# Patient Record
Sex: Male | Born: 1950 | Race: Black or African American | Hispanic: No | Marital: Married | Smoking: Former smoker
Health system: Southern US, Community
[De-identification: ages and names within clinical notes are randomized; demographics above are authoritative.]

## PROBLEM LIST (undated history)

## (undated) DIAGNOSIS — I10 Essential (primary) hypertension: Secondary | ICD-10-CM

## (undated) DIAGNOSIS — K257 Chronic gastric ulcer without hemorrhage or perforation: Secondary | ICD-10-CM

## (undated) DIAGNOSIS — K5792 Diverticulitis of intestine, part unspecified, without perforation or abscess without bleeding: Secondary | ICD-10-CM

## (undated) DIAGNOSIS — M549 Dorsalgia, unspecified: Secondary | ICD-10-CM

## (undated) DIAGNOSIS — Z5189 Encounter for other specified aftercare: Secondary | ICD-10-CM

## (undated) DIAGNOSIS — N200 Calculus of kidney: Secondary | ICD-10-CM

## (undated) HISTORY — DX: Chronic gastric ulcer without hemorrhage or perforation: K25.7

## (undated) HISTORY — DX: Dorsalgia, unspecified: M54.9

## (undated) HISTORY — DX: Calculus of kidney: N20.0

## (undated) HISTORY — DX: Encounter for other specified aftercare: Z51.89

## (undated) HISTORY — DX: Essential (primary) hypertension: I10

## (undated) HISTORY — DX: Diverticulitis of intestine, part unspecified, without perforation or abscess without bleeding: K57.92

---

## 2004-07-30 ENCOUNTER — Ambulatory Visit: Payer: Self-pay | Admitting: Anesthesiology

## 2004-10-12 ENCOUNTER — Ambulatory Visit: Payer: Self-pay | Admitting: Anesthesiology

## 2004-12-15 ENCOUNTER — Ambulatory Visit: Payer: Self-pay | Admitting: Anesthesiology

## 2005-01-14 ENCOUNTER — Ambulatory Visit: Payer: Self-pay | Admitting: Anesthesiology

## 2005-03-02 ENCOUNTER — Ambulatory Visit: Payer: Self-pay | Admitting: Anesthesiology

## 2005-05-20 ENCOUNTER — Ambulatory Visit: Payer: Self-pay | Admitting: Anesthesiology

## 2005-08-19 ENCOUNTER — Ambulatory Visit: Payer: Self-pay | Admitting: Anesthesiology

## 2005-11-04 ENCOUNTER — Ambulatory Visit: Payer: Self-pay | Admitting: Anesthesiology

## 2006-01-20 ENCOUNTER — Ambulatory Visit: Payer: Self-pay | Admitting: Anesthesiology

## 2006-03-31 ENCOUNTER — Other Ambulatory Visit: Payer: Self-pay

## 2006-03-31 ENCOUNTER — Emergency Department: Payer: Self-pay | Admitting: General Practice

## 2006-05-17 ENCOUNTER — Ambulatory Visit: Payer: Self-pay | Admitting: Anesthesiology

## 2006-06-15 ENCOUNTER — Ambulatory Visit: Payer: Self-pay | Admitting: Anesthesiology

## 2006-08-31 ENCOUNTER — Ambulatory Visit: Payer: Self-pay | Admitting: Anesthesiology

## 2006-11-08 ENCOUNTER — Ambulatory Visit: Payer: Self-pay

## 2007-03-06 ENCOUNTER — Ambulatory Visit: Payer: Self-pay | Admitting: Anesthesiology

## 2007-07-11 ENCOUNTER — Ambulatory Visit: Payer: Self-pay | Admitting: Anesthesiology

## 2007-08-08 ENCOUNTER — Ambulatory Visit: Payer: Self-pay | Admitting: Anesthesiology

## 2008-06-05 ENCOUNTER — Ambulatory Visit: Payer: Self-pay | Admitting: Pain Medicine

## 2008-06-18 ENCOUNTER — Ambulatory Visit: Payer: Self-pay | Admitting: Pain Medicine

## 2008-07-03 ENCOUNTER — Ambulatory Visit: Payer: Self-pay | Admitting: Physician Assistant

## 2009-05-12 ENCOUNTER — Ambulatory Visit: Payer: Self-pay | Admitting: Pain Medicine

## 2010-06-03 ENCOUNTER — Ambulatory Visit: Payer: Self-pay | Admitting: Anesthesiology

## 2010-06-24 ENCOUNTER — Ambulatory Visit: Payer: Self-pay | Admitting: Anesthesiology

## 2010-09-21 ENCOUNTER — Ambulatory Visit: Payer: Self-pay | Admitting: Anesthesiology

## 2010-11-09 ENCOUNTER — Emergency Department: Payer: Self-pay | Admitting: Emergency Medicine

## 2010-12-03 ENCOUNTER — Ambulatory Visit: Payer: Self-pay | Admitting: Anesthesiology

## 2011-05-27 ENCOUNTER — Ambulatory Visit: Payer: Self-pay | Admitting: Anesthesiology

## 2011-08-11 ENCOUNTER — Emergency Department: Payer: Self-pay

## 2011-10-15 ENCOUNTER — Ambulatory Visit: Payer: Self-pay | Admitting: Family Medicine

## 2011-10-21 ENCOUNTER — Ambulatory Visit: Payer: Self-pay | Admitting: Anesthesiology

## 2012-01-05 ENCOUNTER — Ambulatory Visit: Payer: Self-pay | Admitting: Gastroenterology

## 2012-06-14 ENCOUNTER — Ambulatory Visit: Payer: Self-pay | Admitting: Orthopedic Surgery

## 2012-10-09 ENCOUNTER — Ambulatory Visit: Payer: Self-pay | Admitting: Pain Medicine

## 2013-01-10 ENCOUNTER — Ambulatory Visit: Payer: Self-pay | Admitting: Pain Medicine

## 2013-01-18 ENCOUNTER — Ambulatory Visit: Payer: Self-pay | Admitting: Pain Medicine

## 2013-03-26 ENCOUNTER — Ambulatory Visit: Payer: Self-pay | Admitting: Pain Medicine

## 2013-05-25 ENCOUNTER — Ambulatory Visit: Payer: Self-pay | Admitting: Pain Medicine

## 2013-11-09 ENCOUNTER — Ambulatory Visit: Payer: Self-pay | Admitting: Pain Medicine

## 2014-04-12 ENCOUNTER — Ambulatory Visit: Payer: Self-pay | Admitting: Pain Medicine

## 2015-07-14 ENCOUNTER — Encounter: Payer: Self-pay | Admitting: Pain Medicine

## 2015-07-23 ENCOUNTER — Ambulatory Visit: Payer: Self-pay | Admitting: Pain Medicine

## 2015-09-24 ENCOUNTER — Other Ambulatory Visit: Payer: Self-pay | Admitting: Pain Medicine

## 2015-09-24 ENCOUNTER — Encounter: Payer: Self-pay | Admitting: Pain Medicine

## 2015-09-24 ENCOUNTER — Ambulatory Visit: Payer: No Typology Code available for payment source | Attending: Pain Medicine | Admitting: Pain Medicine

## 2015-09-24 VITALS — BP 146/76 | HR 64 | Temp 98.6°F | Resp 20 | Ht 72.0 in | Wt 185.0 lb

## 2015-09-24 DIAGNOSIS — M549 Dorsalgia, unspecified: Secondary | ICD-10-CM | POA: Diagnosis present

## 2015-09-24 DIAGNOSIS — Z5181 Encounter for therapeutic drug level monitoring: Secondary | ICD-10-CM | POA: Insufficient documentation

## 2015-09-24 DIAGNOSIS — M542 Cervicalgia: Secondary | ICD-10-CM | POA: Diagnosis present

## 2015-09-24 DIAGNOSIS — M1990 Unspecified osteoarthritis, unspecified site: Secondary | ICD-10-CM | POA: Insufficient documentation

## 2015-09-24 DIAGNOSIS — M5136 Other intervertebral disc degeneration, lumbar region: Secondary | ICD-10-CM | POA: Insufficient documentation

## 2015-09-24 DIAGNOSIS — G4700 Insomnia, unspecified: Secondary | ICD-10-CM | POA: Diagnosis not present

## 2015-09-24 DIAGNOSIS — F1190 Opioid use, unspecified, uncomplicated: Secondary | ICD-10-CM | POA: Insufficient documentation

## 2015-09-24 DIAGNOSIS — Z79891 Long term (current) use of opiate analgesic: Secondary | ICD-10-CM | POA: Diagnosis not present

## 2015-09-24 DIAGNOSIS — Z7189 Other specified counseling: Secondary | ICD-10-CM

## 2015-09-24 DIAGNOSIS — M545 Low back pain, unspecified: Secondary | ICD-10-CM | POA: Insufficient documentation

## 2015-09-24 DIAGNOSIS — M159 Polyosteoarthritis, unspecified: Secondary | ICD-10-CM

## 2015-09-24 DIAGNOSIS — G8929 Other chronic pain: Secondary | ICD-10-CM | POA: Diagnosis not present

## 2015-09-24 DIAGNOSIS — M79604 Pain in right leg: Secondary | ICD-10-CM | POA: Insufficient documentation

## 2015-09-24 DIAGNOSIS — M79605 Pain in left leg: Secondary | ICD-10-CM

## 2015-09-24 DIAGNOSIS — M5126 Other intervertebral disc displacement, lumbar region: Secondary | ICD-10-CM | POA: Insufficient documentation

## 2015-09-24 DIAGNOSIS — G4701 Insomnia due to medical condition: Secondary | ICD-10-CM

## 2015-09-24 DIAGNOSIS — M47812 Spondylosis without myelopathy or radiculopathy, cervical region: Secondary | ICD-10-CM | POA: Insufficient documentation

## 2015-09-24 DIAGNOSIS — M5442 Lumbago with sciatica, left side: Secondary | ICD-10-CM

## 2015-09-24 DIAGNOSIS — M8949 Other hypertrophic osteoarthropathy, multiple sites: Secondary | ICD-10-CM

## 2015-09-24 DIAGNOSIS — M150 Primary generalized (osteo)arthritis: Secondary | ICD-10-CM

## 2015-09-24 DIAGNOSIS — M4806 Spinal stenosis, lumbar region: Secondary | ICD-10-CM | POA: Insufficient documentation

## 2015-09-24 DIAGNOSIS — I10 Essential (primary) hypertension: Secondary | ICD-10-CM | POA: Insufficient documentation

## 2015-09-24 DIAGNOSIS — M7918 Myalgia, other site: Secondary | ICD-10-CM | POA: Insufficient documentation

## 2015-09-24 DIAGNOSIS — M5416 Radiculopathy, lumbar region: Secondary | ICD-10-CM

## 2015-09-24 DIAGNOSIS — M797 Fibromyalgia: Secondary | ICD-10-CM

## 2015-09-24 DIAGNOSIS — M47816 Spondylosis without myelopathy or radiculopathy, lumbar region: Secondary | ICD-10-CM | POA: Insufficient documentation

## 2015-09-24 DIAGNOSIS — Z87891 Personal history of nicotine dependence: Secondary | ICD-10-CM | POA: Diagnosis not present

## 2015-09-24 DIAGNOSIS — M48061 Spinal stenosis, lumbar region without neurogenic claudication: Secondary | ICD-10-CM | POA: Insufficient documentation

## 2015-09-24 MED ORDER — MELATONIN 10 MG PO CAPS: 10.0000 | ORAL_CAPSULE | Freq: Every evening | ORAL | Status: DC | PRN

## 2015-09-24 MED ORDER — MELOXICAM 15 MG PO TABS: 15.0000 mg | ORAL_TABLET | Freq: Every day | ORAL | Status: DC

## 2015-09-24 MED ORDER — TRAMADOL HCL 50 MG PO TABS: 100.0000 mg | ORAL_TABLET | Freq: Four times a day (QID) | ORAL | Status: DC | PRN

## 2015-09-24 NOTE — Progress Notes (Signed)
Patient's Name: Caleb Edwards MRN: [id number] DOB: Dec 29, 1950 DOS: 09/24/2015  Primary Reason(s) for Visit: Encounter for Medication Management CC: Back Pain and Neck Pain   HPI:    [name] is a 64 y.o. year old, male patient, who returns today as an established patient. 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 His primarily concern today is the Back Pain and Neck Pain     The patient returns for her pharmacological management of his chronic pain. He seems to be doing well on his current medication regimen with no major complaints.  Today's Pain Score: 8 , clinically he looks like a 2-3/10. Reported level of pain is incompatible with clinical obrservations. This may be secondary to a possible lack of understanding on how the pain scale works. Pain Type: Chronic pain Pain Location: Back (neck) Pain Orientation: Lower, Mid Pain Descriptors / Indicators: Aching Pain Frequency: Constant  Date of last visit: 05/28/2015 at [facility] Purpose: Right L4-5 lumbar epidural steroid injection.  Pharmacotherapy Review:   Side-effects or Adverse reactions: None reported Effectiveness: Described as relatively effective, allowing for increase in activities of daily living (ADL) Onset of action: Within expected pharmacological parameters Duration of action: Within normal limits for medication Peak effect: Timing and results are as  within normal expected parameters [place] PMP: Compliant with practice rules and regulations UDS Results: No UDS available at this time. UDS Interpretation: No UDS available, at this time Medication Assessment Form: Reviewed. Patient indicates being compliant with therapy Treatment compliance: Compliant Substance Use Disorder (SUD) Risk Level: Low Pharmacologic Plan: Continue therapy as is  Lab Work: Inflammation Markers No results found for: ESRSEDRATE, CRP  Renal Function No results found for: BUN, CREATININE, GFRAA, GFRNONAA  Hepatic Function No results found for: AST, ALT, ALBUMIN  Electrolytes No results found for: NA, K, CL, CALCIUM, MG  Illicit Drugs No results found for: THCU, COCAINSCRNUR, PCPSCRNUR, MDMA, AMPHETMU, METHADONE, ETOH  Procedure Assessment:   Procedure done on last visit: Right-sided L4-5 lumbar epidural steroid injection under fluoroscopic guidance, no sedation. Side-effects or Adverse reactions: None reported Sedation: No sedation used during procedure  Results: Ultra-Short Term Relief (First 1 hour after procedure): 100 %  Possibly the results is influenced by the pharmacodynamic effect of the local anesthetic, as well as that of the intravenous analgesics and/or sedatives, when used Short Term Relief (Initial 4-6 hrs after procedure): 100 % Short-term relief confirms injected site to be the source of pain Long Term Relief : 50 % Long-term benefit would suggest an inflammatory etiology to the pain   Current Relief (Now):  50%  Persistent relief would suggest effective anti-inflammatory effects from steroids Interpretation of Results: Good palliative response.  Allergies:  Mr. Inskeep has No Known Allergies.  Meds:  The patient has a current medication list which includes the following prescription(s): aspirin-salicylamide-caffeine, lisinopril-hydrochlorothiazide, melatonin, meloxicam, and tramadol. Requested Prescriptions   Signed Prescriptions Disp  Refills  . traMADol (ULTRAM) 50 MG tablet 240 tablet 2    Sig: Take 2 tablets (100 mg total) by mouth  every 6 (six) hours as needed for moderate pain or severe pain.  . meloxicam (MOBIC) 15 MG tablet 30 tablet 2    Sig: Take 1 tablet (15 mg total) by mouth daily.  . Melatonin 10 MG CAPS 30 capsule 2    Sig: Take 10 capsules by mouth at bedtime as needed.    ROS:  Constitutional: Afebrile, no chills, well hydrated and well nourished Gastrointestinal: negative Musculoskeletal:negative Neurological: negative Behavioral/Psych: negative  PFSH:  Medical:  [name]  has a past medical history of Hypertension and Back pain. Family: family history includes Diabetes in his mother; Heart disease in his father. Surgical:  has no past surgical history on file. Tobacco:  reports that he has quit smoking. He does not have any smokeless tobacco history on file. Alcohol:  reports that he does not drink alcohol. Drug:  reports that he does not use illicit drugs.  Physical Exam:  Vitals:  Today's Vitals   09/24/15 1423 09/24/15 1425  BP:  146/76  Pulse: 64   Temp: 98.6 F (37 C)   Resp: 20   Height: 6' (1.829 m)   Weight: 185 lb (83.915 kg)   SpO2: 100%   PainSc: 8  8   PainLoc: Back   Calculated BMI: Body mass index is 25.08 kg/(m^2). General appearance: alert, cooperative, appears stated age and no distress Eyes: PERLA Respiratory: No evidence respiratory distress, no audible rales or ronchi and no use of accessory muscles of respiration Neck: no adenopathy, no carotid bruit, no JVD, supple, symmetrical, trachea midline and thyroid not enlarged, symmetric, no tenderness/mass/nodules  Cervical Spine ROM: Adequate for flexion, extension, rotation, and lateral bending Palpation: No palpable trigger points  Upper Extremities ROM: Adequate bilaterally Strength: 5/5 for all flexors and extensors of the upper extremity, bilaterally Pulses: Palpable bilaterally Neurologic: No allodynia,  No hyperesthesia, No hyperpathia and No sensory abnormalities  Lumbar Spine ROM: Adequate for flexion, extension, rotation, and lateral bending Palpation: No palpable trigger points Lumbar Hyperextension and rotation: Non-contributory Patrick's Maneuver: Non-contributory  Lower Extremities ROM: Adequate bilaterally Strength: 5/5 for all flexors and extensors of the lower extremity, bilaterally Pulses: Palpable bilaterally Neurologic: No allodynia, No hyperesthesia, No hyperpathia, No sensory abnormalities and No antalgic gait or posture  Assessment:  Encounter Diagnosis:  Primary Diagnosis: Chronic pain [G89.29]  Plan:  Interventional Therapies: PRN procedure: 1. If the patient develops a flareup of his right lower extremity pain and low back pain, right-sided L4-5 lumbar epidural steroid injection under fluoroscopic guidance without IV sedation. 2. If he develops a flareup of his right upper extremity pain and neck pain, right-sided cervical epidural steroid injection on the fluoroscopic guidance, without IV sedation.  Chrissie NoaWilliam was seen today for back pain and neck pain.  Diagnoses and all orders for this visit:  Chronic pain -     COMPLETE METABOLIC PANEL WITH GFR -     C-reactive protein -     Magnesium -     Sedimentation rate -     Vitamin B12 -     Vitamin D pnl(25-hydrxy+1,25-dihy)-bld -     traMADol (ULTRAM) 50 MG tablet; Take 2 tablets (100 mg total) by mouth every 6 (six) hours as needed for moderate pain or severe pain.  Long term current use of opiate analgesic -     Drugs of abuse screen w/o alc, rtn urine-sln  Encounter for therapeutic drug level monitoring  Encounter for chronic pain management  Insomnia secondary to chronic pain -  Melatonin 10 MG CAPS; Take 10 capsules by mouth at bedtime as needed.  Primary osteoarthritis involving multiple joints -     meloxicam (MOBIC) 15 MG tablet; Take 1 tablet (15 mg total) by mouth daily.  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 Medications discontinued today:  Medications Discontinued During This Encounter  Medication Reason  . Skin Protectants, Misc. (WHITE PETROLATUM-ZINC OXIDE) cream Error  . traMADol (ULTRAM) 50 MG tablet Duplicate   Medications administered today:  Mr. Artola had no medications administered during this visit.  Primary Care Physician: No primary care provider on file. Location: [facility] Note by: Derrick Tiegs A. Laban Emperor, M.D, DABA, DABAPM, DABPM, DABIPP, FIPP

## 2015-09-24 NOTE — Progress Notes (Signed)
Safety precautions to be maintained throughout the outpatient stay will include: orient to surroundings, keep bed in low position, maintain call bell within reach at all times, provide assistance with transfer out of bed and ambulation. Pill count #0 

## 2015-10-02 LAB — TOXASSURE SELECT 13 (MW), URINE: PDF: 0

## 2015-11-04 ENCOUNTER — Ambulatory Visit: Payer: No Typology Code available for payment source | Admitting: Pain Medicine

## 2015-12-03 DIAGNOSIS — Z5189 Encounter for other specified aftercare: Secondary | ICD-10-CM

## 2015-12-03 HISTORY — DX: Encounter for other specified aftercare: Z51.89

## 2015-12-22 ENCOUNTER — Encounter: Payer: Self-pay | Admitting: Pain Medicine

## 2015-12-22 ENCOUNTER — Ambulatory Visit: Payer: BLUE CROSS/BLUE SHIELD | Attending: Pain Medicine | Admitting: Pain Medicine

## 2015-12-22 VITALS — BP 145/84 | HR 58 | Temp 98.5°F | Resp 18 | Ht 72.0 in | Wt 180.0 lb

## 2015-12-22 DIAGNOSIS — Z87891 Personal history of nicotine dependence: Secondary | ICD-10-CM | POA: Diagnosis not present

## 2015-12-22 DIAGNOSIS — M47896 Other spondylosis, lumbar region: Secondary | ICD-10-CM | POA: Insufficient documentation

## 2015-12-22 DIAGNOSIS — G8929 Other chronic pain: Secondary | ICD-10-CM

## 2015-12-22 DIAGNOSIS — Z79891 Long term (current) use of opiate analgesic: Secondary | ICD-10-CM | POA: Diagnosis not present

## 2015-12-22 DIAGNOSIS — Z5181 Encounter for therapeutic drug level monitoring: Secondary | ICD-10-CM

## 2015-12-22 DIAGNOSIS — M545 Low back pain, unspecified: Secondary | ICD-10-CM

## 2015-12-22 DIAGNOSIS — M549 Dorsalgia, unspecified: Secondary | ICD-10-CM | POA: Diagnosis present

## 2015-12-22 DIAGNOSIS — M542 Cervicalgia: Secondary | ICD-10-CM | POA: Insufficient documentation

## 2015-12-22 DIAGNOSIS — M5126 Other intervertebral disc displacement, lumbar region: Secondary | ICD-10-CM | POA: Diagnosis not present

## 2015-12-22 DIAGNOSIS — M47816 Spondylosis without myelopathy or radiculopathy, lumbar region: Secondary | ICD-10-CM

## 2015-12-22 DIAGNOSIS — M4806 Spinal stenosis, lumbar region: Secondary | ICD-10-CM | POA: Diagnosis not present

## 2015-12-22 DIAGNOSIS — G4700 Insomnia, unspecified: Secondary | ICD-10-CM | POA: Diagnosis not present

## 2015-12-22 DIAGNOSIS — M1990 Unspecified osteoarthritis, unspecified site: Secondary | ICD-10-CM | POA: Diagnosis not present

## 2015-12-22 MED ORDER — TRAMADOL HCL 50 MG PO TABS: 100.0000 mg | ORAL_TABLET | Freq: Four times a day (QID) | ORAL | Status: DC | PRN

## 2015-12-22 NOTE — Progress Notes (Signed)
Safety precautions to be maintained throughout the outpatient stay will include: orient to surroundings, keep bed in low position, maintain call bell within reach at all times, provide assistance with transfer out of bed and ambulation. Did not bring Tramadol pill bottle for count.  Reminded to bring to all appointments.

## 2015-12-22 NOTE — Patient Instructions (Addendum)
Instructed to [facility] to [facility] mall for labwork today.GENERAL RISKS AND COMPLICATIONS  What are the risk, side effects and possible complications? Generally speaking, most procedures are safe.  However, with any procedure there are risks, side effects, and the possibility of complications.  The risks and complications are dependent upon the sites that are lesioned, or the type of nerve block to be performed.  The closer the procedure is to the spine, the more serious the risks are.  Great care is taken when placing the radio frequency needles, block needles or lesioning probes, but sometimes complications can occur. 1. Infection: Any time there is an injection through the skin, there is a risk of infection.  This is why sterile conditions are used for these blocks.  There are four possible types of infection. 1. Localized skin infection. 2. Central Nervous System Infection-This can be in the form of Meningitis, which can be deadly. 3. Epidural Infections-This can be in the form of an epidural abscess, which can cause pressure inside of the spine, causing compression of the spinal cord with subsequent paralysis. This would require an emergency surgery to decompress, and there are no guarantees that the patient would recover from the paralysis. 4. Discitis-This is an infection of the intervertebral discs.  It occurs in about 1% of discography procedures.  It is difficult to treat and it may lead to surgery.        2. Pain: the needles have to go through skin and soft tissues, will cause soreness.       3. Damage to internal structures:  The nerves to be lesioned may be near blood vessels or    other nerves which can be potentially damaged.       4. Bleeding: Bleeding is more common if the patient is taking blood thinners such as  aspirin, Coumadin, Ticiid, Plavix, etc., or if he/she have some genetic predisposition  such as hemophilia. Bleeding into the spinal canal can cause compression of the spinal  cord  with subsequent paralysis.  This would require an emergency surgery to  decompress and there are no guarantees that the patient would recover from the  paralysis.       5. Pneumothorax:  Puncturing of a lung is a possibility, every time a needle is introduced in  the area of the chest or upper back.  Pneumothorax refers to free air around the  collapsed lung(s), inside of the thoracic cavity (chest cavity).  Another two possible  complications related to a similar event would include: Hemothorax and Chylothorax.   These are variations of the Pneumothorax, where instead of air around the collapsed  lung(s), you may have blood or chyle, respectively.       6. Spinal headaches: They may occur with any procedures in the area of the spine.       7. Persistent CSF (Cerebro-Spinal Fluid) leakage: This is a rare problem, but may occur  with prolonged intrathecal or epidural catheters either due to the formation of a fistulous  track or a dural tear.       8. Nerve damage: By working so close to the spinal cord, there is always a possibility of  nerve damage, which could be as serious as a permanent spinal cord injury with  paralysis.       9. Death:  Although rare, severe deadly allergic reactions known as "Anaphylactic  reaction" can occur to any of the medications used.      10. Worsening of the  symptoms:  We can always make thing worse.  What are the chances of something like this happening? Chances of any of this occuring are extremely low.  By statistics, you have more of a chance of getting killed in a motor vehicle accident: while driving to the hospital than any of the above occurring .  Nevertheless, you should be aware that they are possibilities.  In general, it is similar to taking a shower.  Everybody knows that you can slip, hit your head and get killed.  Does that mean that you should not shower again?  Nevertheless always keep in mind that statistics do not mean anything if you happen to be on the  wrong side of them.  Even if a procedure has a 1 (one) in a 1,000,000 (million) chance of going wrong, it you happen to be that one..Also, keep in mind that by statistics, you have more of a chance of having something go wrong when taking medications.  Who should not have this procedure? If you are on a blood thinning medication (e.g. Coumadin, Plavix, see list of "Blood Thinners"), or if you have an active infection going on, you should not have the procedure.  If you are taking any blood thinners, please inform your physician.  How should I prepare for this procedure?  Do not eat or drink anything at least six hours prior to the procedure.  Bring a driver with you .  It cannot be a taxi.  Come accompanied by an adult that can drive you back, and that is strong enough to help you if your legs get weak or numb from the local anesthetic.  Take all of your medicines the morning of the procedure with just enough water to swallow them.  If you have diabetes, make sure that you are scheduled to have your procedure done first thing in the morning, whenever possible.  If you have diabetes, take only half of your insulin dose and notify our nurse that you have done so as soon as you arrive at the clinic.  If you are diabetic, but only take blood sugar pills (oral hypoglycemic), then do not take them on the morning of your procedure.  You may take them after you have had the procedure.  Do not take aspirin or any aspirin-containing medications, at least eleven (11) days prior to the procedure.  They may prolong bleeding.  Wear loose fitting clothing that may be easy to take off and that you would not mind if it got stained with Betadine or blood.  Do not wear any jewelry or perfume  Remove any nail coloring.  It will interfere with some of our monitoring equipment.  NOTE: Remember that this is not meant to be interpreted as a complete list of all possible complications.  Unforeseen problems may  occur.  BLOOD THINNERS The following drugs contain aspirin or other products, which can cause increased bleeding during surgery and should not be taken for 2 weeks prior to and 1 week after surgery.  If you should need take something for relief of minor pain, you may take acetaminophen which is found in Tylenol,m Datril, Anacin-3 and Panadol. It is not blood thinner. The products listed below are.  Do not take any of the products listed below in addition to any listed on your instruction sheet.  A.P.C or A.P.C with Codeine Codeine Phosphate Capsules #3 Ibuprofen Ridaura  ABC compound Congesprin Imuran rimadil  Advil Cope Indocin Robaxisal  Alka-Seltzer Effervescent Pain Reliever  and Antacid Coricidin or Coricidin-D  Indomethacin Rufen  Alka-Seltzer plus Cold Medicine Cosprin Ketoprofen S-A-C Tablets  Anacin Analgesic Tablets or Capsules Coumadin Korlgesic Salflex  Anacin Extra Strength Analgesic tablets or capsules CP-2 Tablets Lanoril Salicylate  Anaprox Cuprimine Capsules Levenox Salocol  Anexsia-D Dalteparin Magan Salsalate  Anodynos Darvon compound Magnesium Salicylate Sine-off  Ansaid Dasin Capsules Magsal Sodium Salicylate  Anturane Depen Capsules Marnal Soma  APF Arthritis pain formula Dewitt's Pills Measurin Stanback  Argesic Dia-Gesic Meclofenamic Sulfinpyrazone  Arthritis Bayer Timed Release Aspirin Diclofenac Meclomen Sulindac  Arthritis pain formula Anacin Dicumarol Medipren Supac  Analgesic (Safety coated) Arthralgen Diffunasal Mefanamic Suprofen  Arthritis Strength Bufferin Dihydrocodeine Mepro Compound Suprol  Arthropan liquid Dopirydamole Methcarbomol with Aspirin Synalgos  ASA tablets/Enseals Disalcid Micrainin Tagament  Ascriptin Doan's Midol Talwin  Ascriptin A/D Dolene Mobidin Tanderil  Ascriptin Extra Strength Dolobid Moblgesic Ticlid  Ascriptin with Codeine Doloprin or Doloprin with Codeine Momentum Tolectin  Asperbuf Duoprin Mono-gesic Trendar  Aspergum Duradyne  Motrin or Motrin IB Triminicin  Aspirin plain, buffered or enteric coated Durasal Myochrisine Trigesic  Aspirin Suppositories Easprin Nalfon Trillsate  Aspirin with Codeine Ecotrin Regular or Extra Strength Naprosyn Uracel  Atromid-S Efficin Naproxen Ursinus  Auranofin Capsules Elmiron Neocylate Vanquish  Axotal Emagrin Norgesic Verin  Azathioprine Empirin or Empirin with Codeine Normiflo Vitamin E  Azolid Emprazil Nuprin Voltaren  Bayer Aspirin plain, buffered or children's or timed BC Tablets or powders Encaprin Orgaran Warfarin Sodium  Buff-a-Comp Enoxaparin Orudis Zorpin  Buff-a-Comp with Codeine Equegesic Os-Cal-Gesic   Buffaprin Excedrin plain, buffered or Extra Strength Oxalid   Bufferin Arthritis Strength Feldene Oxphenbutazone   Bufferin plain or Extra Strength Feldene Capsules Oxycodone with Aspirin   Bufferin with Codeine Fenoprofen Fenoprofen Pabalate or Pabalate-SF   Buffets II Flogesic Panagesic   Buffinol plain or Extra Strength Florinal or Florinal with Codeine Panwarfarin   Buf-Tabs Flurbiprofen Penicillamine   Butalbital Compound Four-way cold tablets Penicillin   Butazolidin Fragmin Pepto-Bismol   Carbenicillin Geminisyn Percodan   Carna Arthritis Reliever Geopen Persantine   Carprofen Gold's salt Persistin   Chloramphenicol Goody's Phenylbutazone   Chloromycetin Haltrain Piroxlcam   Clmetidine heparin Plaquenil   Cllnoril Hyco-pap Ponstel   Clofibrate Hydroxy chloroquine Propoxyphen         Before stopping any of these medications, be sure to consult the physician who ordered them.  Some, such as Coumadin (Warfarin) are ordered to prevent or treat serious conditions such as "deep thrombosis", "pumonary embolisms", and other heart problems.  The amount of time that you may need off of the medication may also vary with the medication and the reason for which you were taking it.  If you are taking any of these medications, please make sure you notify your pain  physician before you undergo any procedures.         Facet Blocks Patient Information  Description: The facets are joints in the spine between the vertebrae.  Like any joints in the body, facets can become irritated and painful.  Arthritis can also effect the facets.  By injecting steroids and local anesthetic in and around these joints, we can temporarily block the nerve supply to them.  Steroids act directly on irritated nerves and tissues to reduce selling and inflammation which often leads to decreased pain.  Facet blocks may be done anywhere along the spine from the neck to the low back depending upon the location of your pain.   After numbing the skin with local anesthetic (like Novocaine), a small needle  is passed onto the facet joints under x-ray guidance.  You may experience a sensation of pressure while this is being done.  The entire block usually lasts about 15-25 minutes.   Conditions which may be treated by facet blocks:   Low back/buttock pain  Neck/shoulder pain  Certain types of headaches  Preparation for the injection:  1. Do not eat any solid food or dairy products within 8 hours of your appointment. 2. You may drink clear liquid up to 3 hours before appointment.  Clear liquids include water, black coffee, juice or soda.  No milk or cream please. 3. You may take your regular medication, including pain medications, with a sip of water before your appointment.  Diabetics should hold regular insulin (if taken separately) and take 1/2 normal NPH dose the morning of the procedure.  Carry some sugar containing items with you to your appointment. 4. A driver must accompany you and be prepared to drive you home after your procedure. 5. Bring all your current medications with you. 6. An IV may be inserted and sedation may be given at the discretion of the physician. 7. A blood pressure cuff, EKG and other monitors will often be applied during the procedure.  Some patients  may need to have extra oxygen administered for a short period. 8. You will be asked to provide medical information, including your allergies and medications, prior to the procedure.  We must know immediately if you are taking blood thinners (like Coumadin/Warfarin) or if you are allergic to IV iodine contrast (dye).  We must know if you could possible be pregnant.  Possible side-effects:   Bleeding from needle site  Infection (rare, may require surgery)  Nerve injury (rare)  Numbness & tingling (temporary)  Difficulty urinating (rare, temporary)  Spinal headache (a headache worse with upright posture)  Light-headedness (temporary)  Pain at injection site (serveral days)  Decreased blood pressure (rare, temporary)  Weakness in arm/leg (temporary)  Pressure sensation in back/neck (temporary)   Call if you experience:   Fever/chills associated with headache or increased back/neck pain  Headache worsened by an upright position  New onset, weakness or numbness of an extremity below the injection site  Hives or difficulty breathing (go to the emergency room)  Inflammation or drainage at the injection site(s)  Severe back/neck pain greater than usual  New symptoms which are concerning to you  Please note:  Although the local anesthetic injected can often make your back or neck feel good for several hours after the injection, the pain will likely return. It takes 3-7 days for steroids to work.  You may not notice any pain relief for at least one week.  If effective, we will often do a series of 2-3 injections spaced 3-6 weeks apart to maximally decrease your pain.  After the initial series, you may be a candidate for a more permanent nerve block of the facets.  If you have any questions, please call #[phone]) [facility]

## 2015-12-22 NOTE — Progress Notes (Signed)
Patient's Name: Caleb Edwards MRN: [id number] DOB: 05/14/1951 DOS: 12/22/2015  Primary Reason(s) for Visit: Encounter for Medication Management CC: Back Pain   HPI  Caleb Edwards is a 65 y.o. year old, male patient, who returns today as an established patient. 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 His primarily concern today is the Back Pain   The patient returns to the clinic today for pharmacological management of his chronic pain. The patient indicates doing well. The patient indicates that his worst pain is in the lower back with the right side being worst on the left. He also indicates that his secondary worst pain is that of the lower extremity on the right leg going down to the level of the knee through the posterior aspect of the back. Physical exam today was positive for bilateral lumbar facet block on hyperextension and rotation maneuver.  Pain Assessment: Self-Reported Pain Score: 7 , clinically he looks like a 1-2/10. Reported level is inconsistent with clinical obrservations Pain Type: Chronic pain Pain Location: Back Pain Orientation: Right, Lower Pain Descriptors / Indicators: Aching, Nagging Pain Frequency: Constant  Date of Last Visit: 11/25/15 Service Provided on Last Visit: Med Refill  Controlled Substance Pharmacotherapy Assessment   Analgesic: Tramadol 50 mg 2 tablets every 6 hours (400 mg/day) MME/day: 40 mg/day Pharmacokinetics: Onset of action (Liberation/Absorption): Within expected pharmacological parameters Time to Peak effect (Distribution): Timing and results are as within normal expected parameters Duration of action (Metabolism/Excretion): Within normal limits for medication Pharmacodynamics: Analgesic Effect: More than 50% Activity Facilitation: Medication(s) allow patient to sit, stand, walk, and do the basic ADLs Perceived Effectiveness: Described as relatively effective, allowing for increase in activities of daily living (ADL) Side-effects or Adverse reactions: None reported Monitoring: [place] PMP: Compliant with practice rules and regulations UDS Results/interpretation: The patient's last UDS done on 09/24/2015 came back within normal limits with no unexpected results. Medication Assessment Form: Reviewed. Patient indicates being compliant with therapy Treatment compliance: Compliant Risk Assessment: Aberrant Behavior: None observed today Substance Use Disorder (SUD) Risk Level: Low Opioid Risk Tool (ORT) Score: Total Score: 0 Low Risk for SUD (Score <3) Depression Scale Score: PHQ-2: PHQ-2 Total Score: 0 No depression (0) PHQ-9: PHQ-9 Total Score: 0 No depression (0-4)  Pharmacologic Plan: No change in therapy, at this time   Laboratory Workup  Last ED UDS: No results found for: THCU, COCAINSCRNUR, PCPSCRNUR, MDMA, AMPHETMU, METHADONE, ETOH  Inflammation Markers No results found for: ESRSEDRATE, CRP  Renal Function No results found for: BUN, CREATININE, GFRAA, GFRNONAA  Hepatic Function No results found for: AST, ALT, ALBUMIN  Electrolytes No results found for: NA, K, CL, CALCIUM, MG   Allergies  Caleb Edwards has No Known Allergies.  Meds  The patient has a current medication list which includes the following prescription(s): aspirin-salicylamide-caffeine, ginseng,  lisinopril-hydrochlorothiazide, naproxen sodium, tramadol, turmeric, and vitamin b-12.  Current Outpatient Prescriptions on File Prior to Visit  Medication Sig  . Aspirin-Salicylamide-Caffeine (BC HEADACHE POWDER PO) Take 1 application  by mouth as needed.  Marland Kitchen lisinopril-hydrochlorothiazide (PRINZIDE,ZESTORETIC) 20-25 MG tablet Take 1 tablet by mouth daily.   No current facility-administered medications on file prior to visit.    ROS  Constitutional: Afebrile, no chills, well hydrated and well nourished Gastrointestinal: negative Musculoskeletal:negative Neurological: negative Behavioral/Psych: negative  PFSH  Medical:  Caleb Edwards  has a past medical history of Hypertension and Back pain. Family: family history includes Diabetes in his mother; Heart disease in his father. Surgical:  has no past surgical history on file. Tobacco:  reports that he has quit smoking. He does not have any smokeless tobacco history on file. Alcohol:  reports that he does not drink alcohol. Drug:  reports that he does not use illicit drugs.  Physical Exam  Vitals:  Today's Vitals   12/22/15 0822 12/22/15 0823  BP:  145/84  Pulse:  58  Temp:  98.5 F (36.9 C)  Resp:  18  Height: 6' (1.829 m)   Weight: 180 lb (81.647 kg)   SpO2:  98%  PainSc: 7  7   PainLoc: Back     Calculated BMI: Body mass index is 24.41 kg/(m^2).     General appearance: alert, cooperative, appears stated age and no distress Eyes: PERLA Respiratory: No evidence respiratory distress, no audible rales or ronchi and no use of accessory muscles of respiration  Lumbar Spine Exam  Inspection: No gross anomalies detected Alignment: Symetrical Palpation: Painful ROM:  Flexion: Decreased ROM Extension: Restricted ROM Lateral Bending: Decreased ROM Rotation: Decreased ROM Provocative Tests:  Lumbar Hyperextension and rotation test:  Positive for facet pain, bilaterally. Patrick's Maneuver: deferred  Gait Evaluation  Gait:  WNL  Lower Extremity Exam  Inspection: No gross anomalies detected ROM: Adequate Sensory:  Normal Motor: Unremarkable  Toe walk (S1): WNL  Heal walk (L5): WNL   Assessment & Plan  Primary Diagnosis & Pertinent Problem List: The primary encounter diagnosis was Chronic pain. Diagnoses of Encounter for therapeutic drug level monitoring, Long term current use of opiate analgesic, Chronic low back pain (Location of Primary Source of Pain) (Left), Lumbar spondylosis, unspecified spinal osteoarthritis, and Lumbar facet syndrome (Location of Primary Source of Pain) (Bilateral) (R>L) were also pertinent to this visit.  Visit Diagnosis: 1. Chronic pain   2. Encounter for therapeutic drug level monitoring   3. Long term current use of opiate analgesic   4. Chronic low back pain (Location of Primary Source of Pain) (Left)   5. Lumbar spondylosis, unspecified spinal osteoarthritis   6. Lumbar facet syndrome (Location of Primary Source of Pain) (Bilateral) (R>L)     Problem-specific Plan(s): No problem-specific assessment & plan notes found for this encounter.   Plan of Care  Pharmacotherapy (Medications Ordered): Meds ordered this encounter  Medications  . traMADol (ULTRAM) 50 MG tablet    Sig: Take 2 tablets (100 mg total) by mouth every 6 (six) hours as needed for moderate pain or severe pain.    Dispense:  240 tablet    Refill:  2    Do not place this medication, or any other prescription from our practice, on "Automatic Refill". Patient may have prescription filled one day early if pharmacy is closed on scheduled refill date. Do not fill until: 09/24/15 To last until: 12/20/15    [facility] & Procedure Ordered: Orders Placed This Encounter  Procedures  . LUMBAR FACET(MEDIAL BRANCH NERVE BLOCK) MBNB    Standing Status: Future     Number of Occurrences:      Standing Expiration Date:  12/21/2016    Scheduling Instructions:     Side: Bilateral     Level: L2, L3, L4, L5, & S1 Medial  Branch Nerve     Sedation: With Sedation.     Timeframe: ASAA    Order Specific Question:  Where will this procedure be performed?    Answer:  [facility]  . ToxASSURE Select 13 (MW), Urine    Volume: 30 ml(s). Minimum 3 ml of urine is needed. Document temperature of fresh sample. Indications: Long term (current) use of opiate analgesic (Z79.891)  . Comprehensive metabolic panel    Standing Status: Future     Number of Occurrences:      Standing Expiration Date: 01/21/2016    Order Specific Question:  Has the patient fasted?    Answer:  No  . C-reactive protein    Standing Status: Future     Number of Occurrences:      Standing Expiration Date: 01/21/2016  . Magnesium    Standing Status: Future     Number of Occurrences:      Standing Expiration Date: 01/21/2016  . Sedimentation rate    Standing Status: Future     Number of Occurrences:      Standing Expiration Date: 01/21/2016  . Vitamin B12    Standing Status: Future     Number of Occurrences:      Standing Expiration Date: 01/21/2016  . Vitamin D 1,25 dihydroxy    Standing Status: Future     Number of Occurrences:      Standing Expiration Date: 01/21/2016    Imaging Ordered: None  Interventional Therapies: Scheduled: Diagnostic, bilateral, lumbar facet block under fluoroscopic guidance and IV sedation. PRN Procedures: Possible bilateral lumbar facet radiofrequency ablation.    Referral(s) or Consult(s): None at this time.  Medications administered during this visit: Caleb Edwards had no medications administered during this visit.  No future appointments.  Primary Care Physician: No primary care provider on file. Location: [facility] Note by: Carri Spillers A. Laban Emperor, M.D, DABA, DABAPM, DABPM, DABIPP, FIPP  Pain Score Disclaimer: We use the NRS-11 scale. This is a self-reported, subjective measurement of pain severity with only modest accuracy. It is used primarily to identify  changes within a particular patient. It must be understood that outpatient pain scales are significantly less accurate that those used for research, where they can be applied under ideal controlled circumstances with minimal exposure to variables. In reality, the score is likely to be a combination of pain intensity and pain affect, where pain affect describes the degree of emotional arousal or changes in action readiness caused by the sensory experience of pain. Factors such as social and work situation, setting, emotional state, anxiety levels, expectation, and prior pain experience may influence pain perception and show large inter-individual differences that may also be affected by time variables.

## 2015-12-26 ENCOUNTER — Other Ambulatory Visit
Admission: RE | Admit: 2015-12-26 | Discharge: 2015-12-26 | Disposition: A | Discharge status: Home / Self Care | Payer: BLUE CROSS/BLUE SHIELD | Source: Ambulatory Visit | Attending: Pain Medicine | Admitting: Pain Medicine

## 2015-12-26 DIAGNOSIS — G8929 Other chronic pain: Secondary | ICD-10-CM | POA: Insufficient documentation

## 2015-12-26 LAB — COMPREHENSIVE METABOLIC PANEL
ALBUMIN: 4.4 g/dL (ref 3.5–5.0)
ALK PHOS: 79 U/L (ref 38–126)
ALT: 16 U/L — AB (ref 17–63)
ANION GAP: 7 (ref 5–15)
AST: 19 U/L (ref 15–41)
BUN: 13 mg/dL (ref 6–20)
CALCIUM: 9.3 mg/dL (ref 8.9–10.3)
CO2: 29 mmol/L (ref 22–32)
Chloride: 99 mmol/L — ABNORMAL LOW (ref 101–111)
Creatinine, Ser: 1.34 mg/dL — ABNORMAL HIGH (ref 0.61–1.24)
GFR calc Af Amer: >60 mL/min (ref >60)
GFR calc non Af Amer: 55 mL/min — ABNORMAL LOW (ref >60)
GLUCOSE: 81 mg/dL (ref 65–99)
Potassium: 3.8 mmol/L (ref 3.5–5.1)
SODIUM: 135 mmol/L (ref 135–145)
Total Bilirubin: 0.6 mg/dL (ref 0.3–1.2)
Total Protein: 7.4 g/dL (ref 6.5–8.1)

## 2015-12-26 LAB — TOXASSURE SELECT 13 (MW), URINE: PDF: 0

## 2015-12-26 LAB — SEDIMENTATION RATE: Sed Rate: 3 mm/hr (ref 0–20)

## 2015-12-26 LAB — MAGNESIUM: Magnesium: 2.1 mg/dL (ref 1.7–2.4)

## 2015-12-26 LAB — VITAMIN B12: Vitamin B-12: 613 pg/mL (ref 180–914)

## 2015-12-26 LAB — C-REACTIVE PROTEIN: CRP: 0.6 mg/dL (ref <1.0)

## 2015-12-27 LAB — VITAMIN D 25 HYDROXY (VIT D DEFICIENCY, FRACTURES): Vit D, 25-Hydroxy: 24 ng/mL — ABNORMAL LOW (ref 30.0–100.0)

## 2016-01-02 ENCOUNTER — Emergency Department: Payer: BLUE CROSS/BLUE SHIELD

## 2016-01-02 ENCOUNTER — Inpatient Hospital Stay
Admission: EM | Admit: 2016-01-02 | Discharge: 2016-01-04 | DRG: 384 | Disposition: A | Discharge status: Home / Self Care | Payer: BLUE CROSS/BLUE SHIELD | Attending: Internal Medicine | Admitting: Internal Medicine

## 2016-01-02 ENCOUNTER — Encounter: Payer: Self-pay | Admitting: Emergency Medicine

## 2016-01-02 DIAGNOSIS — K921 Melena: Secondary | ICD-10-CM | POA: Diagnosis present

## 2016-01-02 DIAGNOSIS — Z8249 Family history of ischemic heart disease and other diseases of the circulatory system: Secondary | ICD-10-CM

## 2016-01-02 DIAGNOSIS — R0609 Other forms of dyspnea: Secondary | ICD-10-CM | POA: Diagnosis present

## 2016-01-02 DIAGNOSIS — K259 Gastric ulcer, unspecified as acute or chronic, without hemorrhage or perforation: Principal | ICD-10-CM | POA: Diagnosis present

## 2016-01-02 DIAGNOSIS — R195 Other fecal abnormalities: Secondary | ICD-10-CM

## 2016-01-02 DIAGNOSIS — Z833 Family history of diabetes mellitus: Secondary | ICD-10-CM

## 2016-01-02 DIAGNOSIS — K449 Diaphragmatic hernia without obstruction or gangrene: Secondary | ICD-10-CM | POA: Diagnosis present

## 2016-01-02 DIAGNOSIS — Z87891 Personal history of nicotine dependence: Secondary | ICD-10-CM

## 2016-01-02 DIAGNOSIS — K315 Obstruction of duodenum: Secondary | ICD-10-CM | POA: Diagnosis present

## 2016-01-02 DIAGNOSIS — I10 Essential (primary) hypertension: Secondary | ICD-10-CM | POA: Diagnosis present

## 2016-01-02 DIAGNOSIS — G8929 Other chronic pain: Secondary | ICD-10-CM | POA: Diagnosis present

## 2016-01-02 DIAGNOSIS — D62 Acute posthemorrhagic anemia: Secondary | ICD-10-CM | POA: Diagnosis present

## 2016-01-02 DIAGNOSIS — R112 Nausea with vomiting, unspecified: Secondary | ICD-10-CM

## 2016-01-02 DIAGNOSIS — K0889 Other specified disorders of teeth and supporting structures: Secondary | ICD-10-CM | POA: Diagnosis present

## 2016-01-02 DIAGNOSIS — Z79899 Other long term (current) drug therapy: Secondary | ICD-10-CM

## 2016-01-02 DIAGNOSIS — T39395A Adverse effect of other nonsteroidal anti-inflammatory drugs [NSAID], initial encounter: Secondary | ICD-10-CM | POA: Diagnosis present

## 2016-01-02 DIAGNOSIS — D649 Anemia, unspecified: Secondary | ICD-10-CM | POA: Diagnosis present

## 2016-01-02 DIAGNOSIS — M549 Dorsalgia, unspecified: Secondary | ICD-10-CM | POA: Diagnosis present

## 2016-01-02 DIAGNOSIS — K222 Esophageal obstruction: Secondary | ICD-10-CM | POA: Diagnosis present

## 2016-01-02 LAB — CBC
HCT: 26.4 % — ABNORMAL LOW (ref 40.0–52.0)
Hemoglobin: 8.6 g/dL — ABNORMAL LOW (ref 13.0–18.0)
MCH: 29.1 pg (ref 26.0–34.0)
MCHC: 32.5 g/dL (ref 32.0–36.0)
MCV: 89.6 fL (ref 80.0–100.0)
PLATELETS: 203 10*3/uL (ref 150–440)
RBC: 2.95 MIL/uL — AB (ref 4.40–5.90)
RDW: 14 % (ref 11.5–14.5)
WBC: 16.8 10*3/uL — ABNORMAL HIGH (ref 3.8–10.6)

## 2016-01-02 LAB — COMPREHENSIVE METABOLIC PANEL
ALK PHOS: 48 U/L (ref 38–126)
ALT: 13 U/L — AB (ref 17–63)
ANION GAP: 2 — AB (ref 5–15)
AST: 18 U/L (ref 15–41)
Albumin: 3.7 g/dL (ref 3.5–5.0)
BILIRUBIN TOTAL: 0.4 mg/dL (ref 0.3–1.2)
BUN: 34 mg/dL — ABNORMAL HIGH (ref 6–20)
CALCIUM: 8.7 mg/dL — AB (ref 8.9–10.3)
CO2: 31 mmol/L (ref 22–32)
CREATININE: 1.1 mg/dL (ref 0.61–1.24)
Chloride: 103 mmol/L (ref 101–111)
Glucose, Bld: 125 mg/dL — ABNORMAL HIGH (ref 65–99)
Potassium: 3.7 mmol/L (ref 3.5–5.1)
SODIUM: 136 mmol/L (ref 135–145)
TOTAL PROTEIN: 6.4 g/dL — AB (ref 6.5–8.1)

## 2016-01-02 LAB — URINALYSIS COMPLETE WITH MICROSCOPIC (ARMC ONLY)
BILIRUBIN URINE: NEGATIVE
Bacteria, UA: NONE SEEN
GLUCOSE, UA: NEGATIVE mg/dL
HGB URINE DIPSTICK: NEGATIVE
KETONES UR: NEGATIVE mg/dL
Leukocytes, UA: NEGATIVE
NITRITE: NEGATIVE
Protein, ur: NEGATIVE mg/dL
Specific Gravity, Urine: 1.018 (ref 1.005–1.030)
Squamous Epithelial / LPF: NONE SEEN
pH: 8 (ref 5.0–8.0)

## 2016-01-02 LAB — HEMOGLOBIN AND HEMATOCRIT, BLOOD
HCT: 22.2 % — ABNORMAL LOW (ref 40.0–52.0)
Hemoglobin: 7.3 g/dL — ABNORMAL LOW (ref 13.0–18.0)

## 2016-01-02 LAB — LIPASE, BLOOD: Lipase: 12 U/L (ref 11–51)

## 2016-01-02 LAB — TROPONIN I: Troponin I: <0.03 ng/mL (ref <0.031)

## 2016-01-02 MED ORDER — ONDANSETRON HCL 4 MG/2ML IJ SOLN
INTRAMUSCULAR | Status: AC
  Administered 2016-01-02: 4 mg via INTRAVENOUS
  Filled 2016-01-02: qty 2

## 2016-01-02 MED ORDER — PANTOPRAZOLE SODIUM 40 MG IV SOLR
40.0000 mg | Freq: Two times a day (BID) | INTRAVENOUS | Status: DC
  Administered 2016-01-02 – 2016-01-03 (×2): 40 mg via INTRAVENOUS
  Filled 2016-01-02 (×2): qty 40

## 2016-01-02 MED ORDER — METOPROLOL TARTRATE 1 MG/ML IV SOLN: 5.0000 mg | Freq: Four times a day (QID) | INTRAVENOUS | Status: DC | PRN

## 2016-01-02 MED ORDER — ONDANSETRON HCL 4 MG/2ML IJ SOLN
4.0000 mg | Freq: Once | INTRAMUSCULAR | Status: AC
  Administered 2016-01-02: 4 mg via INTRAVENOUS

## 2016-01-02 MED ORDER — DEXTROSE-NACL 5-0.9 % IV SOLN
INTRAVENOUS | Status: AC
  Administered 2016-01-02: 19:00:00 via INTRAVENOUS

## 2016-01-02 MED ORDER — DIATRIZOATE MEGLUMINE & SODIUM 66-10 % PO SOLN
15.0000 mL | Freq: Once | ORAL | Status: AC
  Administered 2016-01-02: 15 mL via ORAL

## 2016-01-02 MED ORDER — ONDANSETRON HCL 4 MG PO TABS: 4.0000 mg | ORAL_TABLET | Freq: Four times a day (QID) | ORAL | Status: DC | PRN

## 2016-01-02 MED ORDER — ACETAMINOPHEN 325 MG PO TABS
650.0000 mg | ORAL_TABLET | Freq: Four times a day (QID) | ORAL | Status: DC | PRN
  Administered 2016-01-03: 650 mg via ORAL
  Filled 2016-01-02: qty 2

## 2016-01-02 MED ORDER — IOPAMIDOL (ISOVUE-370) INJECTION 76%
100.0000 mL | Freq: Once | INTRAVENOUS | Status: AC | PRN
  Administered 2016-01-02: 100 mL via INTRAVENOUS

## 2016-01-02 MED ORDER — ACETAMINOPHEN 650 MG RE SUPP: 650.0000 mg | Freq: Four times a day (QID) | RECTAL | Status: DC | PRN

## 2016-01-02 MED ORDER — ONDANSETRON HCL 4 MG/2ML IJ SOLN: 4.0000 mg | Freq: Four times a day (QID) | INTRAMUSCULAR | Status: DC | PRN

## 2016-01-02 MED ORDER — MORPHINE SULFATE (PF) 2 MG/ML IV SOLN
2.0000 mg | INTRAVENOUS | Status: DC | PRN
  Administered 2016-01-02 – 2016-01-04 (×3): 2 mg via INTRAVENOUS
  Filled 2016-01-02 (×3): qty 1

## 2016-01-02 MED ORDER — SODIUM CHLORIDE 0.9 % IV BOLUS (SEPSIS)
1000.0000 mL | Freq: Once | INTRAVENOUS | Status: AC
  Administered 2016-01-02: 1000 mL via INTRAVENOUS

## 2016-01-02 NOTE — ED Provider Notes (Signed)
[facility] Emergency Department Provider Note   ____________________________________________  Time seen: Approximately 11:10 AM I have reviewed the triage vital signs and the triage nursing note.  HISTORY  Chief Complaint Emesis and Nausea   Historian Patient and wife  HPI Caleb Edwards is a 65 y.o. male who lives with his wife who is here for about 2-3 days of vomiting. He states that he has about 3 episodes of vomiting per day. Occasionally he'll have mid or left-sided abdominal pain/which is described as cramping. No fever. No known sick contacts. No diarrhea. He states that he's only had small very hard stools over the past 2 weeks, consistent with some prior issues with constipation. He denies black or bloody stools. He states that his emesis today looked dark and coffee ground in appearance.  They report that he had a colonoscopy about a year ago and was told it was fine.  He denies diarrhea. He states he has some shortness of breath both at rest and then made worse when he starts feeling nauseated and vomits. When he is vomiting, he feels fatigued and has sweats.  Patient is concerned about possible bowel obstruction. He reports that he is had about obstruction the past.  Symptoms are moderate. Unknown trigger of what would make it worse or better.    Past Medical History  Diagnosis Date  . Hypertension   . Back pain     Patient Active Problem List   Diagnosis Date Noted  . Chronic pain 09/24/2015  . Long term current use of opiate analgesic 09/24/2015  . Long term prescription opiate use 09/24/2015  . Opiate use 09/24/2015  . Encounter for therapeutic drug level monitoring 09/24/2015  . Encounter for chronic pain management 09/24/2015  . Insomnia secondary to chronic pain 09/24/2015  . Osteoarthritis 09/24/2015  . Chronic low back pain (Location of Primary Source of Pain) (Bilateral) (R>L) 09/24/2015  . Lumbar spondylosis 09/24/2015  .  Chronic lower extremity pain (Right) 09/24/2015  . 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 Lumbar facet syndrome (Location of Primary Source of Pain) (Bilateral) (R>L) 09/24/2015  . Diffuse myofascial pain syndrome 09/24/2015  . L4-5 disc bulge 09/24/2015  . 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 Chronic neck pain 09/24/2015  . Cervical spondylosis 09/24/2015    History reviewed. No pertinent past surgical history.  Current Outpatient Rx  Name  Route  Sig  Dispense  Refill  . Aspirin-Salicylamide-Caffeine (BC HEADACHE POWDER PO)   Oral   Take 1 application by mouth as needed.         . Ginseng 100 MG CAPS   Oral   Take by mouth.         Marland Kitchen lisinopril-hydrochlorothiazide (PRINZIDE,ZESTORETIC) 20-25 MG tablet   Oral   Take 1 tablet by mouth daily.         . naproxen sodium (ALEVE) 220 MG tablet   Oral   Take 220 mg by mouth.         . traMADol (ULTRAM) 50 MG tablet   Oral   Take 2 tablets (100 mg total) by mouth every 6 (six) hours as needed for moderate pain or severe pain.   240 tablet   2     Do not place this medication, or any other prescri ...   . Turmeric POWD   Does not apply   by Does not apply route.         . vitamin  B-12 (CYANOCOBALAMIN) 250 MCG tablet   Oral   Take by mouth.           Allergies Review of patient's allergies indicates no known allergies.  Family History  Problem Relation Age of Onset  . Diabetes Mother   . Heart disease Father     Social History Social History  Substance Use Topics  . Smoking status: Former Games developer  . Smokeless tobacco: None  . Alcohol Use: No    Review of Systems  Constitutional: Negative for fever. Eyes: Negative for visual changes. ENT: Negative for sore throat. Cardiovascular: Negative for chest pain. Respiratory: Positive for mild and occasional shortness of breath. Gastrointestinal: As per history of present illness. Genitourinary:  Negative for dysuria. Musculoskeletal: Negative for back pain. Skin: Negative for rash. Neurological: Negative for headache. 10 point Review of Systems otherwise negative ____________________________________________   PHYSICAL EXAM:  VITAL SIGNS: ED Triage Vitals  Enc Vitals Group     BP 01/02/16 1047 104/64 mmHg     Pulse Rate 01/02/16 1047 111     Resp 01/02/16 1047 18     Temp 01/02/16 1047 98.4 F (36.9 C)     Temp Source 01/02/16 1047 Oral     SpO2 01/02/16 1047 100 %     Weight 01/02/16 1047 180 lb (81.647 kg)     Height 01/02/16 1047 6' (1.829 m)     Head Cir --      Peak Flow --      Pain Score 01/02/16 1048 8     Pain Loc --      Pain Edu? --      Excl. in GC? --      Constitutional: Alert and oriented. Well appearing and in no distress. HEENT   Head: Normocephalic and atraumatic.      Eyes: Conjunctivae are normal. PERRL. Normal extraocular movements.      Ears:         Nose: No congestion/rhinnorhea.   Mouth/Throat: Mucous membranes are moderately dry.   Neck: No stridor. Cardiovascular/Chest: Normal rate, regular rhythm.  No murmurs, rubs, or gallops. Respiratory: Normal respiratory effort without tachypnea nor retractions. Breath sounds are clear and equal bilaterally. No wheezes/rales/rhonchi. Gastrointestinal: Soft except for one area of firm and mild tenderness in the left side of the abdomen. No distention, no guarding, no rebound.  Genitourinary/rectal: No external murmurs. Nontender rectal exam. Dark secretions without any rectal impaction, which are strongly heme positive. Musculoskeletal: Nontender with normal range of motion in all extremities. No joint effusions.  No lower extremity tenderness. Mild bilateral lower extremity edema, 2+ bilateral and equal. Neurologic:  Normal speech and language. No gross or focal neurologic deficits are appreciated. Skin:  Skin is warm, dry and intact. No rash noted. Psychiatric: Mood and affect are  normal. Speech and behavior are normal. Patient exhibits appropriate insight and judgment.  ____________________________________________   EKG I, Governor Rooks, MD, the attending physician have personally viewed and interpreted all ECGs.  60 bpm normal sinus rhythm. Narrow QRS. Normal axis. Normal ST and T-wave ____________________________________________  LABS (pertinent positives/negatives)  Urinalysis negative Lipase 12 Conference metabolic panel significant for BUN 34 and creatinine 1.10 White blood count 16.8, hemoglobin 8.6 and platelet count 203 Troponin less than 0.03  ____________________________________________  RADIOLOGY All Xrays were viewed by me. Imaging interpreted by Radiologist.  Chest and abdomen x-ray:  IMPRESSION: 1. No active cardiopulmonary disease. 2. Nonobstructive bowel gas pattern. 3. Bilateral renal stones. __________________________________________  PROCEDURES  Procedure(s) performed:  None  Critical Care performed: None  ____________________________________________   ED COURSE / ASSESSMENT AND PLAN  Pertinent labs & imaging results that were available during my care of the patient were reviewed by me and considered in my medical decision making (see chart for details).  This patient is here for vomiting, and he is most concerned about obstruction. His abdomen is not distended, and he really just has mild tenderness over a mass in the left abdomen which could be consistent with stool.  Although he did not report any black or bloody or tarry stools in the past, his Hemoccult testing was strongly positive.  In terms of other causes of vomiting, there is in the community a lot of GI viruses, but without fever, or diarrhea, I am hesitant to simply call this gastroenteritis.  It doesn't sound like he is having ACS symptoms. He does report near syncope during an episode of nausea with sweats in the bathroom this morning, without true loss of  consciousness.   An enema was tried, and only small stool was retrieved.  I did have the secretary talk with the [facility] to obtain last hemoglobin, which was 09/23/15 and was 14.  The patient's new hemoglobin of 8.6 anemia seems to be causing him symptomatic fatigue and shortness of breath, and when associated with strongly heme positive stool, I believe he needs hospital admission/observation for stability.  CT scan of the chest abdomen and pelvis showed no acute findings.    CONSULTATIONS:  Hospitalist for admission   Patient / Family / Caregiver informed of clinical course, medical decision-making process, and agree with plan.    ___________________________________________   FINAL CLINICAL IMPRESSION(S) / ED DIAGNOSES   Final diagnoses:  Occult blood in stools  Symptomatic anemia  Non-intractable vomiting with nausea, vomiting of unspecified type              Note: This dictation was prepared with Dragon dictation. Any transcriptional errors that result from this process are unintentional   Governor Rooksebecca Shalie Schremp, MD 01/02/16 1645

## 2016-01-02 NOTE — Consult Note (Signed)
GI Inpatient Consult Note  Reason for Consult:UGI bleeding   Attending Requesting Consult:Dr. Gouru  History of Present Illness: Caleb Edwards is a 65 y.o. male with hx of taking BC powder and Aleve for pain and toothache and began vomiting dark material and passing black bowel movements.  Pt seen in ER and had hgb of 8.6 and it was 14 in 09/2015.  He had a syncopal spell today at home also.  Admitted for GI bleeding. Noted elevated BUN with normal creatinine.  Past Medical History:  Past Medical History  Diagnosis Date  . Hypertension   . Back pain     Problem List: Patient Active Problem List   Diagnosis Date Noted  . Symptomatic anemia 01/02/2016  . Chronic pain 09/24/2015  . Long term current use of opiate analgesic 09/24/2015  . Long term prescription opiate use 09/24/2015  . Opiate use 09/24/2015  . Encounter for therapeutic drug level monitoring 09/24/2015  . Encounter for chronic pain management 09/24/2015  . Insomnia secondary to chronic pain 09/24/2015  . Osteoarthritis 09/24/2015  . Chronic low back pain (Location of Primary Source of Pain) (Bilateral) (R>L) 09/24/2015  . Lumbar spondylosis 09/24/2015  . Chronic lower extremity pain (Right) 09/24/2015  . 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 Lumbar facet syndrome (Location of Primary Source of Pain) (Bilateral) (R>L) 09/24/2015  . Diffuse myofascial pain syndrome 09/24/2015  . L4-5 disc bulge 09/24/2015  . 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 Chronic neck pain 09/24/2015  . Cervical spondylosis 09/24/2015    Past Surgical History: History reviewed. No pertinent past surgical history.  Allergies: No Known Allergies  Home Medications: Prescriptions prior to admission  Medication Sig Dispense Refill Last Dose  . amLODipine (NORVASC) 2.5 MG tablet Take 2.5 mg by mouth daily.   01/01/2016 at pm   . Aspirin-Salicylamide-Caffeine (BC HEADACHE POWDER PO) Take 1  application by mouth as needed.   prn at prn  . ferrous sulfate 325 (65 FE) MG tablet Take 325 mg by mouth daily with breakfast.   01/01/2016 at pm   . Ginseng 100 MG CAPS Take 100 mg by mouth daily.    01/01/2016 at Unknown time  . lisinopril-hydrochlorothiazide (PRINZIDE,ZESTORETIC) 20-12.5 MG tablet Take 2 tablets by mouth daily.   01/01/2016 at pm   . naproxen sodium (ALEVE) 220 MG tablet Take 220 mg by mouth.   prn at prn  . traMADol (ULTRAM) 50 MG tablet Take 2 tablets (100 mg total) by mouth every 6 (six) hours as needed for moderate pain or severe pain. 240 tablet 2 prn at prn   Home medication reconciliation was completed with the patient.   Scheduled Inpatient Medications:   . pantoprazole (PROTONIX) IV  40 mg Intravenous Q12H    Continuous Inpatient Infusions:   . dextrose 5 % and 0.9% NaCl 100 mL/hr at 01/02/16 1857    PRN Inpatient Medications:  acetaminophen **OR** acetaminophen, metoprolol, morphine injection, ondansetron **OR** ondansetron (ZOFRAN) IV  Family History: family history includes Diabetes in his mother; Heart disease in his father.  The patient's family history is negative for inflammatory bowel disorders, GI malignancy, or solid organ transplantation.  Social History:   reports that he has quit smoking. He does not have any smokeless tobacco history on file. He reports that he does not drink alcohol or use illicit drugs. The patient denies ETOH, tobacco, or drug use.   Review of Systems: Constitutional: Weight is stable.  Eyes: No  changes in vision. ENT: No oral lesions, sore throat.  GI: see HPI.  Heme/Lymph: No easy bruising.  CV: No chest pain.  GU: No hematuria.  Integumentary: No rashes.  Neuro: No headaches.  Psych: No depression/anxiety.  Endocrine: No heat/cold intolerance.  Allergic/Immunologic: No urticaria.  Resp: No cough, SOB.  Musculoskeletal: No joint swelling.    Physical Examination: BP 127/49 mmHg  Pulse 70  Temp(Src) 99.1 F  (37.3 C) (Oral)  Resp 10  Ht 6' (1.829 m)  Wt 81.647 kg (180 lb)  BMI 24.41 kg/m2  SpO2 100% Gen: NAD, alert and oriented x 4 HEENT: , EOMI, Neck: supple, no JVD or thyromegaly Chest: CTA bilaterally, no wheezes, crackles, or other adventitious sounds CV: RRR, no m/g/c/r Abd: soft, NT, ND, +BS in all four quadrants; no HSM, guarding, ridigity, or rebound tenderness Ext: no edema, well perfused with 2+ pulses, Skin: no rash or lesions noted Lymph: no LAD  Data: Lab Results  Component Value Date   WBC 16.8* 01/02/2016   HGB 8.6* 01/02/2016   HCT 26.4* 01/02/2016   MCV 89.6 01/02/2016   PLT 203 01/02/2016    Recent Labs Lab 01/02/16 1127  HGB 8.6*   Lab Results  Component Value Date   NA 136 01/02/2016   K 3.7 01/02/2016   CL 103 01/02/2016   CO2 31 01/02/2016   BUN 34* 01/02/2016   CREATININE 1.10 01/02/2016   Lab Results  Component Value Date   ALT 13* 01/02/2016   AST 18 01/02/2016   ALKPHOS 48 01/02/2016   BILITOT 0.4 01/02/2016   No results for input(s): APTT, INR, PTT in the last 168 hours. Assessment/Plan: [name] is a 65 y.o. male with evidence of UGI bleed due to ASA and Aleve.  Will follow and schedule EGD for tomorrow.  Repeat CBC or hgb in morning.  Recommendations:Follow Hgb, do EGD tomorrow  Thank you for the consult. Please call with questions or concerns.  Lynnae PrudeELLIOTT, ROBERT, MD   2

## 2016-01-02 NOTE — ED Notes (Signed)
Pt states tongue felt numb, no swelling or difficulty breathing noted, MD aware, no new orders at this time.

## 2016-01-02 NOTE — H&P (Signed)
[facility] [place] at [facility]   PATIENT NAME: Caleb Edwards    MR#:  [id number]  DATE OF BIRTH:  07/19/1951  DATE OF ADMISSION:  01/02/2016  PRIMARY CARE PHYSICIAN: [facility]   REQUESTING/REFERRING PHYSICIAN: Dr. Shaune PollackLord  CHIEF COMPLAINT:    nausea and vomiting HISTORY OF PRESENT ILLNESS:  Caleb Edwards  is a 65 y.o. male with a known history of Essential hypertension chronic low back pain is presenting to the ED with a chief complaint of intractable nausea and vomiting for the past 2-3 days. Denies any blood in his emesis. Reporting epigastric abdominal pain. Denies any dizziness or loss of consciousness. X-ray and CAT scan did not reveal any acute abnormalities. Stool for Hemoccult was positive. His hemoglobin currently is at 8.6 but according to the patient's primary care physician as per ED physician's discussion his hemoglobin was at 14 in December 2016. Patient denies any active bleeding. [facility]. According to care everywhere colonoscopy was done for heme positive stool and patient had EGD in September 2015 which has revealed mild gastritis with gastric ulcer and chronic duodenitis biopsies were obtained at the time. Patient reports taking BC powders and Aleve more often in the past one week for toothache. Reporting exertional dyspnea  PAST MEDICAL HISTORY:   Past Medical History  Diagnosis Date  . Hypertension   . Back pain     PAST SURGICAL HISTOIRY:  History reviewed. No pertinent past surgical history.  SOCIAL HISTORY:   Social History  Substance Use Topics  . Smoking status: Former Games developermoker  . Smokeless tobacco: Not on file  . Alcohol Use: No    FAMILY HISTORY:   Family History  Problem Relation Age of Onset  . Diabetes Mother   . Heart disease Father     DRUG ALLERGIES:  No Known Allergies  REVIEW OF SYSTEMS:  CONSTITUTIONAL: No fever, fatigue or weakness.   EYES: No blurred or double vision.  EARS, NOSE, AND THROAT: No tinnitus or ear pain.  RESPIRATORY: No cough, shortness of breath, wheezing or hemoptysis.  CARDIOVASCULAR: No chest pain, orthopnea, edema.  GASTROINTESTINAL: Reporting nausea, vomiting, denies diarrhea , has epigastric abdominal pain and reporting black stool GENITOURINARY: No dysuria, hematuria.  ENDOCRINE: No polyuria, nocturia,  HEMATOLOGY: No anemia, easy bruising or bleeding SKIN: No rash or lesion. MUSCULOSKELETAL: No joint pain or arthritis.   NEUROLOGIC: No tingling, numbness, weakness.  PSYCHIATRY: No anxiety or depression.   MEDICATIONS AT HOME:   Prior to Admission medications   Medication Sig Start Date End Date Taking? Authorizing Provider  amLODipine (NORVASC) 2.5 MG tablet Take 2.5 mg by mouth daily.   Yes Historical Provider, MD  Aspirin-Salicylamide-Caffeine (BC HEADACHE POWDER PO) Take 1 application by mouth as needed.   Yes Historical Provider, MD  ferrous sulfate 325 (65 FE) MG tablet Take 325 mg by mouth daily with breakfast.   Yes Historical Provider, MD  Ginseng 100 MG CAPS Take 100 mg by mouth daily.    Yes Historical Provider, MD  lisinopril-hydrochlorothiazide (PRINZIDE,ZESTORETIC) 20-12.5 MG tablet Take 2 tablets by mouth daily.   Yes Historical Provider, MD  naproxen sodium (ALEVE) 220 MG tablet Take 220 mg by mouth.   Yes Historical Provider, MD  traMADol (ULTRAM) 50 MG tablet Take 2 tablets (100 mg total) by mouth every 6 (six) hours as needed for moderate pain or severe pain. 12/22/15  Yes Delano MetzFrancisco Naveira, MD  VITAL SIGNS:  Blood pressure 105/69, pulse 68, temperature 97.9 F (36.6 C), temperature source Oral, resp. rate 18, height 6' (1.829 m), weight 81.647 kg (180 lb), SpO2 100 %.  PHYSICAL EXAMINATION:  GENERAL:  65 y.o.-year-old patient lying in the bed with no acute distress.  EYES: Pupils equal, round, reactive to light and accommodation. No scleral icterus. Extraocular  muscles intact.  HEENT: Head atraumatic, normocephalic. Oropharynx and nasopharynx clear.  NECK:  Supple, no jugular venous distention. No thyroid enlargement, no tenderness.  LUNGS: Normal breath sounds bilaterally, no wheezing, rales,rhonchi or crepitation. No use of accessory muscles of respiration.  CARDIOVASCULAR: S1, S2 normal. No murmurs, rubs, or gallops.  ABDOMEN: Soft, Epigastric tenderness is present but no rebound tenderness , nondistended. Bowel sounds present. No organomegaly or mass.  EXTREMITIES: No pedal edema, cyanosis, or clubbing.  NEUROLOGIC: Cranial nerves II through XII are intact. Muscle strength 5/5 in all extremities. Sensation intact. Gait not checked.  PSYCHIATRIC: The patient is alert and oriented x 3.  SKIN: No obvious rash, lesion, or ulcer.   LABORATORY PANEL:   CBC  Recent Labs Lab 01/02/16 1127  WBC 16.8*  HGB 8.6*  HCT 26.4*  PLT 203   ------------------------------------------------------------------------------------------------------------------  Chemistries   Recent Labs Lab 01/02/16 1127  NA 136  K 3.7  CL 103  CO2 31  GLUCOSE 125*  BUN 34*  CREATININE 1.10  CALCIUM 8.7*  AST 18  ALT 13*  ALKPHOS 48  BILITOT 0.4   ------------------------------------------------------------------------------------------------------------------  Cardiac Enzymes  Recent Labs Lab 01/02/16 1127  TROPONINI <0.03   ------------------------------------------------------------------------------------------------------------------  RADIOLOGY:  Ct Angio Chest Pe W/cm &/or Wo Cm  01/02/2016  CLINICAL DATA:  65 year old male nausea and vomiting, shortness of breath, coffee ground emesis, abdominal and constipation for 10 days. EXAM: CT ANGIOGRAPHY CHEST CT ABDOMEN AND PELVIS WITH CONTRAST TECHNIQUE: Multidetector CT imaging of the chest was performed using the standard protocol during bolus administration of intravenous contrast. Multiplanar CT image  reconstructions and MIPs were obtained to evaluate the vascular anatomy. Multidetector CT imaging of the abdomen and pelvis was performed using the standard protocol during bolus administration of intravenous contrast. CONTRAST:  100 cc intravenous Isovue 370 COMPARISON:  01/02/2016 and prior chest radiographs. 06/14/2012 chest CT. FINDINGS: CTA CHEST FINDINGS Mediastinum/Nodes: No pulmonary emboli identified. There is no evidence of thoracic aortic aneurysm or definite dissection. No enlarged lymph nodes, mediastinal mass or pericardial effusion. Lungs/Pleura: There is no evidence of airspace disease, consolidation, suspicious nodule/mass or endobronchial/ endotracheal lesion. Tiny subpleural nodules bilaterally, some which are calcified, are unchanged. There is no evidence of pleural effusion or pneumothorax. Musculoskeletal: No acute or suspicious abnormalities. CT ABDOMEN and PELVIS FINDINGS Hepatobiliary: The liver and gallbladder are unremarkable. There is no evidence of biliary dilatation. Pancreas: Unremarkable Spleen: Unremarkable Adrenals/Urinary Tract: Nonobstructing bilateral renal calculi are identified. There is no evidence of hydronephrosis or renal mass. The adrenal glands and bladder are unremarkable. Stomach/Bowel: Colonic diverticulosis noted without evidence of diverticulitis. There is no evidence of bowel obstruction, definite focal bowel wall thickening or pneumoperitoneum. Vascular/Lymphatic: Aortic atherosclerotic calcifications noted without aneurysm. No enlarged lymph nodes. Reproductive: Unremarkable Other: No free fluid or abscess. Musculoskeletal: No acute or suspicious abnormalities. Grade 1 spondylolisthesis of L5 on S1 noted with facet arthropathy in the lower lumbar spine. Review of the MIP images confirms the above findings. IMPRESSION: No evidence of acute abnormality. Nonobstructing bilateral renal calculi, colonic diverticulosis and aortic atherosclerosis. Electronically Signed    By: Henrietta Hoover.D.  On: 01/02/2016 14:26   Ct Abdomen Pelvis W Contrast  01/02/2016  CLINICAL DATA:  65 year old male nausea and vomiting, shortness of breath, coffee ground emesis, abdominal and constipation for 10 days. EXAM: CT ANGIOGRAPHY CHEST CT ABDOMEN AND PELVIS WITH CONTRAST TECHNIQUE: Multidetector CT imaging of the chest was performed using the standard protocol during bolus administration of intravenous contrast. Multiplanar CT image reconstructions and MIPs were obtained to evaluate the vascular anatomy. Multidetector CT imaging of the abdomen and pelvis was performed using the standard protocol during bolus administration of intravenous contrast. CONTRAST:  100 cc intravenous Isovue 370 COMPARISON:  01/02/2016 and prior chest radiographs. 06/14/2012 chest CT. FINDINGS: CTA CHEST FINDINGS Mediastinum/Nodes: No pulmonary emboli identified. There is no evidence of thoracic aortic aneurysm or definite dissection. No enlarged lymph nodes, mediastinal mass or pericardial effusion. Lungs/Pleura: There is no evidence of airspace disease, consolidation, suspicious nodule/mass or endobronchial/ endotracheal lesion. Tiny subpleural nodules bilaterally, some which are calcified, are unchanged. There is no evidence of pleural effusion or pneumothorax. Musculoskeletal: No acute or suspicious abnormalities. CT ABDOMEN and PELVIS FINDINGS Hepatobiliary: The liver and gallbladder are unremarkable. There is no evidence of biliary dilatation. Pancreas: Unremarkable Spleen: Unremarkable Adrenals/Urinary Tract: Nonobstructing bilateral renal calculi are identified. There is no evidence of hydronephrosis or renal mass. The adrenal glands and bladder are unremarkable. Stomach/Bowel: Colonic diverticulosis noted without evidence of diverticulitis. There is no evidence of bowel obstruction, definite focal bowel wall thickening or pneumoperitoneum. Vascular/Lymphatic: Aortic atherosclerotic calcifications noted without  aneurysm. No enlarged lymph nodes. Reproductive: Unremarkable Other: No free fluid or abscess. Musculoskeletal: No acute or suspicious abnormalities. Grade 1 spondylolisthesis of L5 on S1 noted with facet arthropathy in the lower lumbar spine. Review of the MIP images confirms the above findings. IMPRESSION: No evidence of acute abnormality. Nonobstructing bilateral renal calculi, colonic diverticulosis and aortic atherosclerosis. Electronically Signed   By: Harmon Pier M.D.   On: 01/02/2016 14:26   Dg Abd Acute W/chest  01/02/2016  CLINICAL DATA:  Shortness of breath, vomiting and left abdominal pain. EXAM: DG ABDOMEN ACUTE W/ 1V CHEST COMPARISON:  06/14/2012 chest radiograph. FINDINGS: Stable cardiomediastinal silhouette with normal heart size. No pneumothorax. No pleural effusion. Lungs appear clear, with no acute consolidative airspace disease and no pulmonary edema. No dilated small bowel loops or air-fluid levels. Physiologic stool in the colon. No evidence of pneumatosis or pneumoperitoneum. Bilateral renal stones measuring 6 mm in the lower right kidney and 4 mm in the lower left kidney. IMPRESSION: 1. No active cardiopulmonary disease. 2. Nonobstructive bowel gas pattern. 3. Bilateral renal stones. Electronically Signed   By: Delbert Phenix M.D.   On: 01/02/2016 12:00    EKG:   Orders placed or performed during the hospital encounter of 01/02/16  . ED EKG  . ED EKG    IMPRESSION AND PLAN:    #1 symptomatic anemia with exertional dyspnea  Stool for Hemoccult is positive. Patient's hemoglobin dropped by almost 6 points from December 2016, which was at 14 according to the primary care physician and currently it is at 8.6. Hemodynamically stable GI is consulted and discussed with Dr. Mechele Collin, is considering EGD in a.m. Will provide Protonix 40 g IV every 12 hours IV fluids Clear liquid diet with no carbonated beverages at this time and nothing by mouth after midnight Monitor hemoglobin and  hematocrit closely and transfuse as needed  #2 acute gastritis with epigastric abdominal pain probably NSAID induced gastric irritation NPO and EGD in a.m. Provide IV fluids and  proton pump inhibitors IV every 12 hours Patient was taking BC powders and ALEVE at home for toothache Patient had EGD and colonoscopy at [facility] year 2015 which has revealed gastritis with gastric ulcer and chronic duodenitis  #3 essential hypertension Blood pressure is under control. Currently patient is nothing by mouth Will provide IV Lopressor as needed basis.  #4 chronic low back pain. Currently patient is a symptomatic will provide pain management as needed basis.  DVT prophylaxis with SCDs  All the records are reviewed and case discussed with ED provider. Management plans discussed with the patient, wife and they are in agreement. Greater than 50% time was spent on face-to-face education, counseling" of care  CODE STATUS: Full code, wife is the healthcare power of attorney  TOTAL TIME TAKING CARE OF THIS PATIENT: 45 minutes.    Ramonita Lab M.D on 01/02/2016 at 5:25 PM  Between 7am to 6pm - Pager - [phone]  After 6pm go to [email or web address] - password [email or web address] [facility]  [place] [facility]  Office  [phone]  CC: Primary care physician; [facility]

## 2016-01-02 NOTE — ED Notes (Addendum)
Pt to ed with c/o n/v x 2 days.  Reports coffee ground emesis at home worse this am.  Pt states has not been able to have a bm for 1 1/2 weeks.

## 2016-01-02 NOTE — ED Notes (Addendum)
Pt on toilet, states bowel movement was only several small balls.

## 2016-01-02 NOTE — ED Notes (Signed)
Sup soads enema performed at bedside by this nurse and Clinton SawyerKailey, RN.  Pt given instructions and understands.

## 2016-01-03 ENCOUNTER — Encounter
Admission: EM | Disposition: A | Discharge status: Home / Self Care | Payer: Self-pay | Source: Home / Self Care | Attending: Internal Medicine

## 2016-01-03 ENCOUNTER — Ambulatory Visit
Admission: RE | Admit: 2016-01-03 | Payer: BLUE CROSS/BLUE SHIELD | Source: Ambulatory Visit | Admitting: Unknown Physician Specialty

## 2016-01-03 ENCOUNTER — Inpatient Hospital Stay: Payer: BLUE CROSS/BLUE SHIELD | Admitting: Anesthesiology

## 2016-01-03 HISTORY — PX: ESOPHAGOGASTRODUODENOSCOPY (EGD) WITH PROPOFOL: SHX5813

## 2016-01-03 LAB — CBC
HEMATOCRIT: 20.2 % — AB (ref 40.0–52.0)
HEMOGLOBIN: 6.7 g/dL — AB (ref 13.0–18.0)
MCH: 30.1 pg (ref 26.0–34.0)
MCHC: 33.2 g/dL (ref 32.0–36.0)
MCV: 90.5 fL (ref 80.0–100.0)
Platelets: 148 10*3/uL — ABNORMAL LOW (ref 150–440)
RBC: 2.23 MIL/uL — AB (ref 4.40–5.90)
RDW: 14.5 % (ref 11.5–14.5)
WBC: 8.7 10*3/uL (ref 3.8–10.6)

## 2016-01-03 LAB — BASIC METABOLIC PANEL
ANION GAP: 0 — AB (ref 5–15)
BUN: 17 mg/dL (ref 6–20)
CHLORIDE: 112 mmol/L — AB (ref 101–111)
CO2: 28 mmol/L (ref 22–32)
Calcium: 8.1 mg/dL — ABNORMAL LOW (ref 8.9–10.3)
Creatinine, Ser: 1.02 mg/dL (ref 0.61–1.24)
GFR calc non Af Amer: >60 mL/min (ref >60)
GLUCOSE: 112 mg/dL — AB (ref 65–99)
POTASSIUM: 3.7 mmol/L (ref 3.5–5.1)
Sodium: 140 mmol/L (ref 135–145)

## 2016-01-03 LAB — HEMOGLOBIN AND HEMATOCRIT, BLOOD
HEMATOCRIT: 18.4 % — AB (ref 40.0–52.0)
Hemoglobin: 6.2 g/dL — ABNORMAL LOW (ref 13.0–18.0)

## 2016-01-03 LAB — PROTIME-INR
INR: 1.24
PROTHROMBIN TIME: 15.8 s — AB (ref 11.4–15.0)

## 2016-01-03 LAB — ABO/RH: ABO/RH(D): O POS

## 2016-01-03 SURGERY — EGD (ESOPHAGOGASTRODUODENOSCOPY)
Anesthesia: General

## 2016-01-03 SURGERY — ESOPHAGOGASTRODUODENOSCOPY (EGD) WITH PROPOFOL
Anesthesia: General

## 2016-01-03 MED ORDER — PANTOPRAZOLE SODIUM 40 MG PO TBEC
40.0000 mg | DELAYED_RELEASE_TABLET | Freq: Two times a day (BID) | ORAL | Status: DC
  Administered 2016-01-03 – 2016-01-04 (×2): 40 mg via ORAL
  Filled 2016-01-03 (×2): qty 1

## 2016-01-03 MED ORDER — PROPOFOL 10 MG/ML IV BOLUS
INTRAVENOUS | Status: DC | PRN
  Administered 2016-01-03: 20 mg via INTRAVENOUS
  Administered 2016-01-03: 40 mg via INTRAVENOUS

## 2016-01-03 MED ORDER — MIDAZOLAM HCL 2 MG/2ML IJ SOLN
INTRAMUSCULAR | Status: DC | PRN
  Administered 2016-01-03: 1 mg via INTRAVENOUS

## 2016-01-03 MED ORDER — FENTANYL CITRATE (PF) 100 MCG/2ML IJ SOLN
INTRAMUSCULAR | Status: DC | PRN
  Administered 2016-01-03: 50 ug via INTRAVENOUS

## 2016-01-03 MED ORDER — LACTATED RINGERS IV SOLN
INTRAVENOUS | Status: DC | PRN
  Administered 2016-01-03: 10:00:00 via INTRAVENOUS

## 2016-01-03 MED ORDER — SODIUM CHLORIDE 0.9 % IV SOLN
Freq: Once | INTRAVENOUS | Status: AC
Start: 2016-01-03 — End: 2016-01-03
  Administered 2016-01-03: 11:00:00 via INTRAVENOUS

## 2016-01-03 NOTE — Progress Notes (Signed)
Blood consent obtained and placed on pt's chart.

## 2016-01-03 NOTE — Op Note (Signed)
[facility]enter[facility] Patient Name: Caleb RicherWilliam Edwards Procedure Date: 01/03/2016 9:14 AM MRN: [id number] Account #: [id number] Date of Birth: 02/15/1951 Admit Type: Inpatient Age: [age] [facility] END[facility] ROOM 4 Gender: Male Note Status: Finalized Procedure:            Upper GI endoscopy Indications:          Acute post hemorrhagic anemia, Melena Providers:            Scot Junobert T. Elliott, MD Medicines:            Propofol per Anesthesia Complications:        No immediate complications. Procedure:            Pre-Anesthesia Assessment:                       - After reviewing the risks and benefits, the patient                        was deemed in satisfactory condition to undergo the                        procedure.                       After obtaining informed consent, the endoscope was                        passed under direct vision. Throughout the procedure,                        the patient's blood pressure, pulse, and oxygen                        saturations were monitored continuously. The Endoscope                        was introduced through the mouth, and advanced to the                        second part of duodenum. The upper GI endoscopy was                        accomplished without difficulty. The patient tolerated                        the procedure well. Findings:      A mild Schatzki ring (acquired) was found at the gastroesophageal       junction.      A small hiatal hernia was present.      One non-bleeding superficial gastric ulcer with no stigmata of bleeding       was found in the prepyloric region of the stomach.      An acquired benign-appearing, intrinsic moderate stenosis was found in       the duodenal bulb and was non-traversed due to size of stenosis..      Scattered mild inflammation characterized by erythema and granularity       was found in the gastric body. Impression:           - Mild Schatzki ring.                       -  Small hiatal hernia.                       - Non-bleeding gastric ulcer with no stigmata of                        bleeding.                       - Acquired duodenal stenosis.                       - No specimens collected. Recommendation:       - The findings and recommendations were discussed with                        the patient's primary physician. Start cleaar liquid                        diet today and full liquids tomorrow. Scot Jun, MD 01/03/2016 10:12:15 AM This report has been signed electronically. Number of Addenda: 0 Note Initiated On: 01/03/2016 9:14 AM      [facility]

## 2016-01-03 NOTE — Progress Notes (Signed)
[facility] - [place] at [facility]   PATIENT NAME: Caleb Edwards    MR#:  [id number]  DATE OF BIRTH:  1951/03/16  SUBJECTIVE:  CHIEF COMPLAINT:   Chief Complaint  Patient presents with  . Emesis  . Nausea   No complaint except of chronic back pain. Status post EGD today. REVIEW OF SYSTEMS:  CONSTITUTIONAL: No fever, fatigue or weakness.  EYES: No blurred or double vision.  EARS, NOSE, AND THROAT: No tinnitus or ear pain.  RESPIRATORY: No cough, shortness of breath, wheezing or hemoptysis.  CARDIOVASCULAR: No chest pain, orthopnea, edema.  GASTROINTESTINAL: No nausea, vomiting, diarrhea or abdominal pain.  GENITOURINARY: No dysuria, hematuria.  ENDOCRINE: No polyuria, nocturia,  HEMATOLOGY: No anemia, easy bruising or bleeding SKIN: No rash or lesion. MUSCULOSKELETAL: Chronic back pain. NEUROLOGIC: No tingling, numbness, weakness.  PSYCHIATRY: No anxiety or depression.   DRUG ALLERGIES:  No Known Allergies  VITALS:  Blood pressure 110/44, pulse 76, temperature 98.7 F (37.1 C), temperature source Oral, resp. rate 18, height 6' (1.829 m), weight 81.647 kg (180 lb), SpO2 95 %.  PHYSICAL EXAMINATION:  GENERAL:  65 y.o.-year-old patient lying in the bed with no acute distress.  EYES: Pupils equal, round, reactive to light and accommodation. No scleral icterus. Extraocular muscles intact.  HEENT: Head atraumatic, normocephalic. Oropharynx and nasopharynx clear.  NECK:  Supple, no jugular venous distention. No thyroid enlargement, no tenderness.  LUNGS: Normal breath sounds bilaterally, no wheezing, rales,rhonchi or crepitation. No use of accessory muscles of respiration.  CARDIOVASCULAR: S1, S2 normal. No murmurs, rubs, or gallops.  ABDOMEN: Soft, nontender, nondistended. Bowel sounds present. No organomegaly or mass.  EXTREMITIES: No pedal edema, cyanosis, or clubbing.  NEUROLOGIC: Cranial nerves II through XII are intact. Muscle strength 5/5 in all  extremities. Sensation intact. Gait not checked.  PSYCHIATRIC: The patient is alert and oriented x 3.  SKIN: No obvious rash, lesion, or ulcer.    LABORATORY PANEL:   CBC  Recent Labs Lab 01/03/16 0434  WBC 8.7  HGB 6.7*  HCT 20.2*  PLT 148*   ------------------------------------------------------------------------------------------------------------------  Chemistries   Recent Labs Lab 01/02/16 1127 01/03/16 0434  NA 136 140  K 3.7 3.7  CL 103 112*  CO2 31 28  GLUCOSE 125* 112*  BUN 34* 17  CREATININE 1.10 1.02  CALCIUM 8.7* 8.1*  AST 18  --   ALT 13*  --   ALKPHOS 48  --   BILITOT 0.4  --    ------------------------------------------------------------------------------------------------------------------  Cardiac Enzymes  Recent Labs Lab 01/02/16 1127  TROPONINI <0.03   ------------------------------------------------------------------------------------------------------------------  RADIOLOGY:  Ct Angio Chest Pe W/cm &/or Wo Cm  01/02/2016  CLINICAL DATA:  65 year old male nausea and vomiting, shortness of breath, coffee ground emesis, abdominal and constipation for 10 days. EXAM: CT ANGIOGRAPHY CHEST CT ABDOMEN AND PELVIS WITH CONTRAST TECHNIQUE: Multidetector CT imaging of the chest was performed using the standard protocol during bolus administration of intravenous contrast. Multiplanar CT image reconstructions and MIPs were obtained to evaluate the vascular anatomy. Multidetector CT imaging of the abdomen and pelvis was performed using the standard protocol during bolus administration of intravenous contrast. CONTRAST:  100 cc intravenous Isovue 370 COMPARISON:  01/02/2016 and prior chest radiographs. 06/14/2012 chest CT. FINDINGS: CTA CHEST FINDINGS Mediastinum/Nodes: No pulmonary emboli identified. There is no evidence of thoracic aortic aneurysm or definite dissection. No enlarged lymph nodes, mediastinal mass or pericardial effusion. Lungs/Pleura: There  is no evidence of airspace disease, consolidation, suspicious nodule/mass or  endobronchial/ endotracheal lesion. Tiny subpleural nodules bilaterally, some which are calcified, are unchanged. There is no evidence of pleural effusion or pneumothorax. Musculoskeletal: No acute or suspicious abnormalities. CT ABDOMEN and PELVIS FINDINGS Hepatobiliary: The liver and gallbladder are unremarkable. There is no evidence of biliary dilatation. Pancreas: Unremarkable Spleen: Unremarkable Adrenals/Urinary Tract: Nonobstructing bilateral renal calculi are identified. There is no evidence of hydronephrosis or renal mass. The adrenal glands and bladder are unremarkable. Stomach/Bowel: Colonic diverticulosis noted without evidence of diverticulitis. There is no evidence of bowel obstruction, definite focal bowel wall thickening or pneumoperitoneum. Vascular/Lymphatic: Aortic atherosclerotic calcifications noted without aneurysm. No enlarged lymph nodes. Reproductive: Unremarkable Other: No free fluid or abscess. Musculoskeletal: No acute or suspicious abnormalities. Grade 1 spondylolisthesis of L5 on S1 noted with facet arthropathy in the lower lumbar spine. Review of the MIP images confirms the above findings. IMPRESSION: No evidence of acute abnormality. Nonobstructing bilateral renal calculi, colonic diverticulosis and aortic atherosclerosis. Electronically Signed   By: Harmon Pier M.D.   On: 01/02/2016 14:26   Ct Abdomen Pelvis W Contrast  01/02/2016  CLINICAL DATA:  65 year old male nausea and vomiting, shortness of breath, coffee ground emesis, abdominal and constipation for 10 days. EXAM: CT ANGIOGRAPHY CHEST CT ABDOMEN AND PELVIS WITH CONTRAST TECHNIQUE: Multidetector CT imaging of the chest was performed using the standard protocol during bolus administration of intravenous contrast. Multiplanar CT image reconstructions and MIPs were obtained to evaluate the vascular anatomy. Multidetector CT imaging of the abdomen and  pelvis was performed using the standard protocol during bolus administration of intravenous contrast. CONTRAST:  100 cc intravenous Isovue 370 COMPARISON:  01/02/2016 and prior chest radiographs. 06/14/2012 chest CT. FINDINGS: CTA CHEST FINDINGS Mediastinum/Nodes: No pulmonary emboli identified. There is no evidence of thoracic aortic aneurysm or definite dissection. No enlarged lymph nodes, mediastinal mass or pericardial effusion. Lungs/Pleura: There is no evidence of airspace disease, consolidation, suspicious nodule/mass or endobronchial/ endotracheal lesion. Tiny subpleural nodules bilaterally, some which are calcified, are unchanged. There is no evidence of pleural effusion or pneumothorax. Musculoskeletal: No acute or suspicious abnormalities. CT ABDOMEN and PELVIS FINDINGS Hepatobiliary: The liver and gallbladder are unremarkable. There is no evidence of biliary dilatation. Pancreas: Unremarkable Spleen: Unremarkable Adrenals/Urinary Tract: Nonobstructing bilateral renal calculi are identified. There is no evidence of hydronephrosis or renal mass. The adrenal glands and bladder are unremarkable. Stomach/Bowel: Colonic diverticulosis noted without evidence of diverticulitis. There is no evidence of bowel obstruction, definite focal bowel wall thickening or pneumoperitoneum. Vascular/Lymphatic: Aortic atherosclerotic calcifications noted without aneurysm. No enlarged lymph nodes. Reproductive: Unremarkable Other: No free fluid or abscess. Musculoskeletal: No acute or suspicious abnormalities. Grade 1 spondylolisthesis of L5 on S1 noted with facet arthropathy in the lower lumbar spine. Review of the MIP images confirms the above findings. IMPRESSION: No evidence of acute abnormality. Nonobstructing bilateral renal calculi, colonic diverticulosis and aortic atherosclerosis. Electronically Signed   By: Harmon Pier M.D.   On: 01/02/2016 14:26   Dg Abd Acute W/chest  01/02/2016  CLINICAL DATA:  Shortness of  breath, vomiting and left abdominal pain. EXAM: DG ABDOMEN ACUTE W/ 1V CHEST COMPARISON:  06/14/2012 chest radiograph. FINDINGS: Stable cardiomediastinal silhouette with normal heart size. No pneumothorax. No pleural effusion. Lungs appear clear, with no acute consolidative airspace disease and no pulmonary edema. No dilated small bowel loops or air-fluid levels. Physiologic stool in the colon. No evidence of pneumatosis or pneumoperitoneum. Bilateral renal stones measuring 6 mm in the lower right kidney and 4 mm in the lower left kidney.  IMPRESSION: 1. No active cardiopulmonary disease. 2. Nonobstructive bowel gas pattern. 3. Bilateral renal stones. Electronically Signed   By: Delbert PhenixJason A Poff M.D.   On: 01/02/2016 12:00    EKG:   Orders placed or performed during the hospital encounter of 01/02/16  . ED EKG  . ED EKG    ASSESSMENT AND PLAN:   #1 symptomatic anemia with exertional dyspnea   Patient's hemoglobin dropped from 8.6 to 6.7 this am. On IV Protonix 40 g IV every 12 hours. S/P EGD, getting PRBC transfusion now. Follow-up Hb in am.  #2 GIB with gastric ulcer. Stool for Hemoccult is positive.  EGD: Non-ding gastric ulcer with no stigmata of bleeding. Acquired duodenal stenosis. Start clear liquid after EGD and advanced to full liquid diet, continue Protonix twice a day per Dr. Markham JordanElliot.  #3 essential hypertension Blood pressure is under control without hypertension medication. On Lopressor IV when necessary.  #4 chronic low back pain. pain management as needed   I discussed with Dr. Markham JordanElliot. All the records are reviewed and case discussed with Care Management/Social Workerr. Management plans discussed with the patient, his wife and they are in agreement.  CODE STATUS: Full code  TOTAL TIME TAKING CARE OF THIS PATIENT: 37 minutes.  Greater than 50% time was spent on coordination of care and face-to-face counseling.  POSSIBLE D/C IN 1-2 DAYS, DEPENDING ON CLINICAL  CONDITION.   Shaune Pollackhen, Analyssa Downs M.D on 01/03/2016 at 2:59 PM  Between 7am to 6pm - Pager - [phone]  After 6pm go to [email or web address] - password [email or web address] [facility]  [facility]  Office  [phone]  CC: Primary care physician; [facility]

## 2016-01-03 NOTE — Anesthesia Procedure Notes (Signed)
Performed by: Sadiya Durand Oxygen Delivery Method: Nasal cannula     

## 2016-01-03 NOTE — Plan of Care (Signed)
Problem: Physical Regulation: Goal: Complications related to the disease process, condition or treatment will be avoided or minimized Outcome: Progressing In am Hgb 6.7. Prior to blood transfusion oral temp 100.7, after rechecked 99.8. Tylenol given per MD order, temp subside. 1xunit of blood transfused. No transfusion reaction occurred. VSS. Pt had EGD today. Tolerates clear liquid diet.

## 2016-01-03 NOTE — Transfer of Care (Signed)
Immediate Anesthesia Transfer of Care Note  Patient: Caleb Edwards  Procedure(s) Performed: Procedure(s): ESOPHAGOGASTRODUODENOSCOPY (EGD) WITH PROPOFOL (N/A)  Patient Location: PACU  Anesthesia Type:General  Level of Consciousness: awake and alert   Airway & Oxygen Therapy: Patient Spontanous Breathing  Post-op Assessment: Report given to RN  Post vital signs: Reviewed and stable  Last Vitals:  Filed Vitals:   01/03/16 0925 01/03/16 1007  BP: 102/51 135/74  Pulse: 95   Temp: 37.7 C 36.8 C  Resp: 20 14    Complications: No apparent anesthesia complications

## 2016-01-03 NOTE — Progress Notes (Signed)
Dr. Tobi BastosPyreddy in to speak with patient concerning blood transfusion.

## 2016-01-03 NOTE — Progress Notes (Signed)
Patients hgb down to 6.7 this morning. Dr. Tobi BastosPyreddy notified and new orders obtained for type and screen, have 2 units of pRBC's ready and transfuse 1 unit pRBC's.

## 2016-01-03 NOTE — Anesthesia Postprocedure Evaluation (Signed)
Anesthesia Post Note  Patient: Caleb Edwards  Procedure(s) Performed: Procedure(s) (LRB): ESOPHAGOGASTRODUODENOSCOPY (EGD) WITH PROPOFOL (N/A)  Patient location during evaluation: PACU Anesthesia Type: General Level of consciousness: awake and alert Pain management: pain level controlled Vital Signs Assessment: post-procedure vital signs reviewed and stable Respiratory status: spontaneous breathing, nonlabored ventilation, respiratory function stable and patient connected to nasal cannula oxygen Cardiovascular status: blood pressure returned to baseline and stable Postop Assessment: no signs of nausea or vomiting Anesthetic complications: no    Last Vitals:  Filed Vitals:   01/03/16 1407 01/03/16 1544  BP:  110/64  Pulse:  70  Temp: 37.1 C 37.1 C  Resp:  20    Last Pain:  Filed Vitals:   01/03/16 1547  PainSc: 0-No pain                 Yevette EdwardsJames G Lynleigh Kovack

## 2016-01-03 NOTE — Anesthesia Preprocedure Evaluation (Signed)
Anesthesia Evaluation  Patient identified by MRN, date of birth, ID band Patient awake    Reviewed: Allergy & Precautions, H&P , NPO status , Patient's Chart, lab work & pertinent test results, reviewed documented beta blocker date and time   Airway Mallampati: II   Neck ROM: full    Dental  (+) Poor Dentition   Pulmonary neg pulmonary ROS, former smoker,    Pulmonary exam normal        Cardiovascular hypertension, negative cardio ROS Normal cardiovascular exam     Neuro/Psych  Neuromuscular disease negative neurological ROS  negative psych ROS   GI/Hepatic negative GI ROS, Neg liver ROS,   Endo/Other  negative endocrine ROS  Renal/GU negative Renal ROS  negative genitourinary   Musculoskeletal negative musculoskeletal ROS (+) Arthritis ,   Abdominal   Peds negative pediatric ROS (+)  Hematology negative hematology ROS (+) anemia ,   Anesthesia Other Findings Past Medical History:   Hypertension                                                 Back pain                                                  History reviewed. No pertinent surgical history. BMI    Body Mass Index   24.40 kg/m 2     Reproductive/Obstetrics negative OB ROS                             Anesthesia Physical Anesthesia Plan  ASA: II and emergent  Anesthesia Plan: General   Post-op Pain Management:    Induction:   Airway Management Planned:   Additional Equipment:   Intra-op Plan:   Post-operative Plan:   Informed Consent: I have reviewed the patients History and Physical, chart, labs and discussed the procedure including the risks, benefits and alternatives for the proposed anesthesia with the patient or authorized representative who has indicated his/her understanding and acceptance.   Dental Advisory Given  Plan Discussed with: CRNA  Anesthesia Plan Comments:         Anesthesia Quick  Evaluation

## 2016-01-04 LAB — BASIC METABOLIC PANEL
ANION GAP: 3 — AB (ref 5–15)
BUN: 9 mg/dL (ref 6–20)
CHLORIDE: 113 mmol/L — AB (ref 101–111)
CO2: 23 mmol/L (ref 22–32)
CREATININE: 1.07 mg/dL (ref 0.61–1.24)
Calcium: 8.1 mg/dL — ABNORMAL LOW (ref 8.9–10.3)
GFR calc non Af Amer: >60 mL/min (ref >60)
Glucose, Bld: 100 mg/dL — ABNORMAL HIGH (ref 65–99)
POTASSIUM: 3.4 mmol/L — AB (ref 3.5–5.1)
SODIUM: 139 mmol/L (ref 135–145)

## 2016-01-04 LAB — CBC
HEMATOCRIT: 21.7 % — AB (ref 40.0–52.0)
HEMOGLOBIN: 7.4 g/dL — AB (ref 13.0–18.0)
MCH: 30.4 pg (ref 26.0–34.0)
MCHC: 34.1 g/dL (ref 32.0–36.0)
MCV: 89 fL (ref 80.0–100.0)
PLATELETS: 145 10*3/uL — AB (ref 150–440)
RBC: 2.44 MIL/uL — AB (ref 4.40–5.90)
RDW: 14 % (ref 11.5–14.5)
WBC: 6.5 10*3/uL (ref 3.8–10.6)

## 2016-01-04 LAB — MAGNESIUM: Magnesium: 1.9 mg/dL (ref 1.7–2.4)

## 2016-01-04 MED ORDER — FERROUS SULFATE 325 (65 FE) MG PO TABS: 325.0000 mg | ORAL_TABLET | Freq: Three times a day (TID) | ORAL | Status: DC

## 2016-01-04 MED ORDER — PANTOPRAZOLE SODIUM 40 MG PO TBEC: 40.0000 mg | DELAYED_RELEASE_TABLET | Freq: Two times a day (BID) | ORAL | Status: DC

## 2016-01-04 NOTE — Discharge Summary (Signed)
[facility] [place] [facility]   PATIENT NAME: Caleb Edwards    MR#:  [id number]  DATE OF BIRTH:  04-21-1951  DATE OF ADMISSION:  01/02/2016 ADMITTING PHYSICIAN: Ramonita Lab, MD  DATE OF DISCHARGE: 01/04/2016 11:10 AM  PRIMARY CARE PHYSICIAN: [facility]    ADMISSION DIAGNOSIS:  Occult blood in stools [R19.5] Symptomatic anemia [D64.9] Non-intractable vomiting with nausea, vomiting of unspecified type [R11.2]   DISCHARGE DIAGNOSIS:   symptomatic anemia GIB with gastric ulcer SECONDARY DIAGNOSIS:   Past Medical History  Diagnosis Date  . Hypertension   . Back pain     HOSPITAL COURSE:   #1 symptomatic anemia with exertional dyspnea  The patient didn't complaining of active bleeding, but hemoglobin dropped from 8.6 to 6.7. He got 1 unit of PRBC transfusion yesterday. Hemoglobin is 7.4 today.  #2 GIB with gastric ulcer. Stool for Hemoccult is positive. He was on IV Protonix 40 g IV every 12 hours.  EGD: Non-ding gastric ulcer with no stigmata of bleeding. Acquired duodenal stenosis. He tolerated clear liquid after EGD and advanced to full liquid diet, continue Protonix twice a day per Dr. Markham Jordan. I  advised the patient to avoid NSAIDs.  #3 essential hypertension.  Blood pressure is in the low side without hypertension medication. I advised the patient to discontinue Norvasc and lisinopril-HCTZ after discharge.  #4 chronic low back pain. pain management as needed,  avoid NSAIDs.   DISCHARGE CONDITIONS:   Stable, discharged to home today.  CONSULTS OBTAINED:     DRUG ALLERGIES:  No Known Allergies  DISCHARGE MEDICATIONS:   Discharge Medication List as of 01/04/2016  9:26 AM    START taking these medications   Details  pantoprazole (PROTONIX) 40 MG tablet Take 1 tablet (40 mg total) by mouth 2 (two) times daily before a meal., Starting 01/04/2016, Until Discontinued, Normal      CONTINUE these medications which  have CHANGED   Details  ferrous sulfate 325 (65 FE) MG tablet Take 1 tablet (325 mg total) by mouth 3 (three) times daily with meals., Starting 01/04/2016, Until Discontinued, Print      CONTINUE these medications which have NOT CHANGED   Details  Aspirin-Salicylamide-Caffeine (BC HEADACHE POWDER PO) Take 1 application by mouth as needed., Until Discontinued, Historical Med      STOP taking these medications     amLODipine (NORVASC) 2.5 MG tablet      Ginseng 100 MG CAPS      lisinopril-hydrochlorothiazide (PRINZIDE,ZESTORETIC) 20-12.5 MG tablet      naproxen sodium (ALEVE) 220 MG tablet      traMADol (ULTRAM) 50 MG tablet          DISCHARGE INSTRUCTIONS:    If you experience worsening of your admission symptoms, develop shortness of breath, life threatening emergency, suicidal or homicidal thoughts you must seek medical attention immediately by calling 911 or calling your MD immediately  if symptoms less severe.  You Must read complete instructions/literature along with all the possible adverse reactions/side effects for all the Medicines you take and that have been prescribed to you. Take any new Medicines after you have completely understood and accept all the possible adverse reactions/side effects.   Please note  You were cared for by a hospitalist during your hospital stay. If you have any questions about your discharge medications or the care you received while you were in the hospital after you are discharged, you can call the unit and asked to speak  with the hospitalist on call if the hospitalist that took care of you is not available. Once you are discharged, your primary care physician will handle any further medical issues. Please note that NO REFILLS for any discharge medications will be authorized once you are discharged, as it is imperative that you return to your primary care physician (or establish a relationship with a primary care physician if you do not have one)  for your aftercare needs so that they can reassess your need for medications and monitor your lab values.    Today   SUBJECTIVE   No complaint.   VITAL SIGNS:  Blood pressure 114/53, pulse 73, temperature 98.1 F (36.7 C), temperature source Oral, resp. rate 18, height 6' (1.829 m), weight 81.647 kg (180 lb), SpO2 100 %.  I/O:   Intake/Output Summary (Last 24 hours) at 01/04/16 1147 Last data filed at 01/04/16 1009  Gross per 24 hour  Intake   1684 ml  Output   1300 ml  Net    384 ml    PHYSICAL EXAMINATION:  GENERAL:  65 y.o.-year-old patient lying in the bed with no acute distress.  EYES: Pupils equal, round, reactive to light and accommodation. No scleral icterus. Extraocular muscles intact.  HEENT: Head atraumatic, normocephalic. Oropharynx and nasopharynx clear.  NECK:  Supple, no jugular venous distention. No thyroid enlargement, no tenderness.  LUNGS: Normal breath sounds bilaterally, no wheezing, rales,rhonchi or crepitation. No use of accessory muscles of respiration.  CARDIOVASCULAR: S1, S2 normal. No murmurs, rubs, or gallops.  ABDOMEN: Soft, non-tender, non-distended. Bowel sounds present. No organomegaly or mass.  EXTREMITIES: No pedal edema, cyanosis, or clubbing.  NEUROLOGIC: Cranial nerves II through XII are intact. Muscle strength 5/5 in all extremities. Sensation intact. Gait not checked.  PSYCHIATRIC: The patient is alert and oriented x 3.  SKIN: No obvious rash, lesion, or ulcer.   DATA REVIEW:   CBC  Recent Labs Lab 01/04/16 0503  WBC 6.5  HGB 7.4*  HCT 21.7*  PLT 145*    Chemistries   Recent Labs Lab 01/02/16 1127  01/04/16 0503  NA 136  < > 139  K 3.7  < > 3.4*  CL 103  < > 113*  CO2 31  < > 23  GLUCOSE 125*  < > 100*  BUN 34*  < > 9  CREATININE 1.10  < > 1.07  CALCIUM 8.7*  < > 8.1*  MG  --   --  1.9  AST 18  --   --   ALT 13*  --   --   ALKPHOS 48  --   --   BILITOT 0.4  --   --   < > = values in this interval not  displayed.  Cardiac Enzymes  Recent Labs Lab 01/02/16 1127  TROPONINI <0.03    Microbiology Results  No results found for this or any previous visit.  RADIOLOGY:  Ct Angio Chest Pe W/cm &/or Wo Cm  01/02/2016  CLINICAL DATA:  65 year old male nausea and vomiting, shortness of breath, coffee ground emesis, abdominal and constipation for 10 days. EXAM: CT ANGIOGRAPHY CHEST CT ABDOMEN AND PELVIS WITH CONTRAST TECHNIQUE: Multidetector CT imaging of the chest was performed using the standard protocol during bolus administration of intravenous contrast. Multiplanar CT image reconstructions and MIPs were obtained to evaluate the vascular anatomy. Multidetector CT imaging of the abdomen and pelvis was performed using the standard protocol during bolus administration of intravenous contrast. CONTRAST:  100 cc intravenous  Isovue 370 COMPARISON:  01/02/2016 and prior chest radiographs. 06/14/2012 chest CT. FINDINGS: CTA CHEST FINDINGS Mediastinum/Nodes: No pulmonary emboli identified. There is no evidence of thoracic aortic aneurysm or definite dissection. No enlarged lymph nodes, mediastinal mass or pericardial effusion. Lungs/Pleura: There is no evidence of airspace disease, consolidation, suspicious nodule/mass or endobronchial/ endotracheal lesion. Tiny subpleural nodules bilaterally, some which are calcified, are unchanged. There is no evidence of pleural effusion or pneumothorax. Musculoskeletal: No acute or suspicious abnormalities. CT ABDOMEN and PELVIS FINDINGS Hepatobiliary: The liver and gallbladder are unremarkable. There is no evidence of biliary dilatation. Pancreas: Unremarkable Spleen: Unremarkable Adrenals/Urinary Tract: Nonobstructing bilateral renal calculi are identified. There is no evidence of hydronephrosis or renal mass. The adrenal glands and bladder are unremarkable. Stomach/Bowel: Colonic diverticulosis noted without evidence of diverticulitis. There is no evidence of bowel  obstruction, definite focal bowel wall thickening or pneumoperitoneum. Vascular/Lymphatic: Aortic atherosclerotic calcifications noted without aneurysm. No enlarged lymph nodes. Reproductive: Unremarkable Other: No free fluid or abscess. Musculoskeletal: No acute or suspicious abnormalities. Grade 1 spondylolisthesis of L5 on S1 noted with facet arthropathy in the lower lumbar spine. Review of the MIP images confirms the above findings. IMPRESSION: No evidence of acute abnormality. Nonobstructing bilateral renal calculi, colonic diverticulosis and aortic atherosclerosis. Electronically Signed   By: Harmon Pier M.D.   On: 01/02/2016 14:26   Ct Abdomen Pelvis W Contrast  01/02/2016  CLINICAL DATA:  65 year old male nausea and vomiting, shortness of breath, coffee ground emesis, abdominal and constipation for 10 days. EXAM: CT ANGIOGRAPHY CHEST CT ABDOMEN AND PELVIS WITH CONTRAST TECHNIQUE: Multidetector CT imaging of the chest was performed using the standard protocol during bolus administration of intravenous contrast. Multiplanar CT image reconstructions and MIPs were obtained to evaluate the vascular anatomy. Multidetector CT imaging of the abdomen and pelvis was performed using the standard protocol during bolus administration of intravenous contrast. CONTRAST:  100 cc intravenous Isovue 370 COMPARISON:  01/02/2016 and prior chest radiographs. 06/14/2012 chest CT. FINDINGS: CTA CHEST FINDINGS Mediastinum/Nodes: No pulmonary emboli identified. There is no evidence of thoracic aortic aneurysm or definite dissection. No enlarged lymph nodes, mediastinal mass or pericardial effusion. Lungs/Pleura: There is no evidence of airspace disease, consolidation, suspicious nodule/mass or endobronchial/ endotracheal lesion. Tiny subpleural nodules bilaterally, some which are calcified, are unchanged. There is no evidence of pleural effusion or pneumothorax. Musculoskeletal: No acute or suspicious abnormalities. CT ABDOMEN  and PELVIS FINDINGS Hepatobiliary: The liver and gallbladder are unremarkable. There is no evidence of biliary dilatation. Pancreas: Unremarkable Spleen: Unremarkable Adrenals/Urinary Tract: Nonobstructing bilateral renal calculi are identified. There is no evidence of hydronephrosis or renal mass. The adrenal glands and bladder are unremarkable. Stomach/Bowel: Colonic diverticulosis noted without evidence of diverticulitis. There is no evidence of bowel obstruction, definite focal bowel wall thickening or pneumoperitoneum. Vascular/Lymphatic: Aortic atherosclerotic calcifications noted without aneurysm. No enlarged lymph nodes. Reproductive: Unremarkable Other: No free fluid or abscess. Musculoskeletal: No acute or suspicious abnormalities. Grade 1 spondylolisthesis of L5 on S1 noted with facet arthropathy in the lower lumbar spine. Review of the MIP images confirms the above findings. IMPRESSION: No evidence of acute abnormality. Nonobstructing bilateral renal calculi, colonic diverticulosis and aortic atherosclerosis. Electronically Signed   By: Harmon Pier M.D.   On: 01/02/2016 14:26   Dg Abd Acute W/chest  01/02/2016  CLINICAL DATA:  Shortness of breath, vomiting and left abdominal pain. EXAM: DG ABDOMEN ACUTE W/ 1V CHEST COMPARISON:  06/14/2012 chest radiograph. FINDINGS: Stable cardiomediastinal silhouette with normal heart size. No pneumothorax. No  pleural effusion. Lungs appear clear, with no acute consolidative airspace disease and no pulmonary edema. No dilated small bowel loops or air-fluid levels. Physiologic stool in the colon. No evidence of pneumatosis or pneumoperitoneum. Bilateral renal stones measuring 6 mm in the lower right kidney and 4 mm in the lower left kidney. IMPRESSION: 1. No active cardiopulmonary disease. 2. Nonobstructive bowel gas pattern. 3. Bilateral renal stones. Electronically Signed   By: Delbert Phenix M.D.   On: 01/02/2016 12:00        Management plans discussed with  the patient, family and they are in agreement.  CODE STATUS:     Code Status Orders        Start     Ordered   01/02/16 1815  Full code   Continuous     01/02/16 1814    Code Status History    Date Active Date Inactive Code Status Order ID Comments User Context   This patient has a current code status but no historical code status.      TOTAL TIME TAKING CARE OF THIS PATIENT: 32 minutes.    Shaune Pollack M.D on 01/04/2016 at 11:47 AM  Between 7am to 6pm - Pager - [phone]  After 6pm go to [email or web address] - password [email or web address]  [facility]  Office  [phone]  CC: Primary care physician; [facility]

## 2016-01-04 NOTE — Progress Notes (Signed)
Pt is being discharged home. Discharge instructions given and explained to pt. Handouts about heart healthy diet given and explained to pt. Pt verbalized understanding. Meds and follow up appointments reviewed with pt. Pt instructed to  scheduled the appointments. RX given.

## 2016-01-04 NOTE — Discharge Instructions (Signed)
Heart healthy diet. Activity as tolerated. Avoid NSAIDS.

## 2016-01-05 ENCOUNTER — Encounter: Payer: Self-pay | Admitting: Unknown Physician Specialty

## 2016-01-05 LAB — TYPE AND SCREEN
ABO/RH(D): O POS
Antibody Screen: NEGATIVE
UNIT DIVISION: 0
Unit division: 0

## 2016-01-05 LAB — PREPARE RBC (CROSSMATCH)

## 2016-01-07 ENCOUNTER — Encounter: Payer: Self-pay | Admitting: Pain Medicine

## 2016-01-07 ENCOUNTER — Other Ambulatory Visit: Payer: Self-pay | Admitting: Pain Medicine

## 2016-01-07 DIAGNOSIS — E559 Vitamin D deficiency, unspecified: Secondary | ICD-10-CM

## 2016-01-07 MED ORDER — VITAMIN D (ERGOCALCIFEROL) 1.25 MG (50000 UNIT) PO CAPS: ORAL_CAPSULE | ORAL | Status: DC

## 2016-01-07 MED ORDER — VITAMIN D3 50 MCG (2000 UT) PO CAPS: ORAL_CAPSULE | ORAL | Status: DC

## 2016-01-07 NOTE — Progress Notes (Signed)

## 2016-01-07 NOTE — Progress Notes (Signed)
Quick Note:   Normal chloride levels are between 95 and 107 mEq/L. Low levels may be due to: Addison disease; Bartter syndrome; burns; congestive heart failure; dehydration; excessive sweating; hyperaldosteronism; metabolic alkalosis; respiratory acidosis (compensated); Syndrome of inappropriate diuretic hormone secretion (SIADH); or vomiting.  Normal Creatinine levels are between 0.5 and 0.9 mg/dl for our lab. Any condition that impairs the function of the kidneys is likely to raise the creatinine level in the blood. The most common causes of longstanding (chronic) kidney disease in adults are high blood pressure and diabetes. Other causes of elevated blood creatinine levels include drugs, ingestion of a large amount of dietary meat, kidney infections, rhabdomyolysis (abnormal muscle breakdown), and urinary tract obstruction.  While most low ALT level results indicate a normal healthy liver, that may not always be the case. A low-functioning or non-functioning liver, lacking normal levels of ALT activity to begin with, would not release a lot of ALT into the blood when damaged. People infected with the hepatitis C virus initially show high ALT levels in their blood, but these levels fall over time. Because the ALT test measures ALT levels at only one point in time, people with chronic hepatitis C infection may already have experienced the ALT peak well before blood was drawn for the ALT test. Urinary tract infections or malnutrition may also cause low blood ALT levels. eGFR (Estimated Glomerular Filtration Rate) results are reported as milliliters/minute/1.57m (mL/min/1.743m. Because some laboratories do not collect information on a patient's race when the sample is collected for testing, they may report calculated results for both African Americans and non-African Americans.  The [facility]s only reporting actual results once values are < 60 mL/min. 1. Normal values:  90-120 mL/min 2. Below 60 mL/min suggests that some kidney damage has occurred. 3. Between 5970nd 30 indicate (Moderate) Stage 3 kidney disease. 4. Between 29 and 15 represent (Severe) Stage 4 kidney disease. 5. Less than 15 is considered (Kidney Failure) Stage 5. ______

## 2016-03-22 ENCOUNTER — Encounter: Payer: Self-pay | Admitting: Pain Medicine

## 2016-03-22 ENCOUNTER — Ambulatory Visit: Payer: BLUE CROSS/BLUE SHIELD | Attending: Pain Medicine | Admitting: Pain Medicine

## 2016-03-22 VITALS — BP 156/84 | HR 81 | Temp 98.5°F | Resp 16 | Ht 72.0 in | Wt 185.0 lb

## 2016-03-22 DIAGNOSIS — M4316 Spondylolisthesis, lumbar region: Secondary | ICD-10-CM | POA: Diagnosis not present

## 2016-03-22 DIAGNOSIS — Z6825 Body mass index (BMI) 25.0-25.9, adult: Secondary | ICD-10-CM | POA: Diagnosis not present

## 2016-03-22 DIAGNOSIS — M545 Low back pain, unspecified: Secondary | ICD-10-CM

## 2016-03-22 DIAGNOSIS — K5900 Constipation, unspecified: Secondary | ICD-10-CM | POA: Insufficient documentation

## 2016-03-22 DIAGNOSIS — Z5181 Encounter for therapeutic drug level monitoring: Secondary | ICD-10-CM | POA: Diagnosis not present

## 2016-03-22 DIAGNOSIS — G8929 Other chronic pain: Secondary | ICD-10-CM

## 2016-03-22 DIAGNOSIS — E559 Vitamin D deficiency, unspecified: Secondary | ICD-10-CM | POA: Diagnosis not present

## 2016-03-22 DIAGNOSIS — M2578 Osteophyte, vertebrae: Secondary | ICD-10-CM | POA: Diagnosis not present

## 2016-03-22 DIAGNOSIS — M1990 Unspecified osteoarthritis, unspecified site: Secondary | ICD-10-CM | POA: Insufficient documentation

## 2016-03-22 DIAGNOSIS — M47816 Spondylosis without myelopathy or radiculopathy, lumbar region: Secondary | ICD-10-CM | POA: Diagnosis not present

## 2016-03-22 DIAGNOSIS — Z87891 Personal history of nicotine dependence: Secondary | ICD-10-CM | POA: Diagnosis not present

## 2016-03-22 DIAGNOSIS — I700 Atherosclerosis of aorta: Secondary | ICD-10-CM | POA: Diagnosis not present

## 2016-03-22 DIAGNOSIS — G4700 Insomnia, unspecified: Secondary | ICD-10-CM | POA: Diagnosis not present

## 2016-03-22 DIAGNOSIS — Z79891 Long term (current) use of opiate analgesic: Secondary | ICD-10-CM

## 2016-03-22 DIAGNOSIS — M4806 Spinal stenosis, lumbar region: Secondary | ICD-10-CM

## 2016-03-22 DIAGNOSIS — K5730 Diverticulosis of large intestine without perforation or abscess without bleeding: Secondary | ICD-10-CM | POA: Insufficient documentation

## 2016-03-22 DIAGNOSIS — Z7982 Long term (current) use of aspirin: Secondary | ICD-10-CM | POA: Insufficient documentation

## 2016-03-22 DIAGNOSIS — M47812 Spondylosis without myelopathy or radiculopathy, cervical region: Secondary | ICD-10-CM | POA: Diagnosis not present

## 2016-03-22 DIAGNOSIS — M48061 Spinal stenosis, lumbar region without neurogenic claudication: Secondary | ICD-10-CM

## 2016-03-22 DIAGNOSIS — M549 Dorsalgia, unspecified: Secondary | ICD-10-CM | POA: Diagnosis present

## 2016-03-22 MED ORDER — TRAMADOL HCL 50 MG PO TABS: 100.0000 mg | ORAL_TABLET | Freq: Four times a day (QID) | ORAL | Status: DC | PRN

## 2016-03-22 NOTE — Patient Instructions (Signed)
Epidural Steroid Injection An epidural steroid injection is given to relieve pain in your neck, back, or legs that is caused by the irritation or swelling of a nerve root. This procedure involves injecting a steroid and numbing medicine (anesthetic) into the epidural space. The epidural space is the space between the outer covering of your spinal cord and the bones that form your backbone (vertebra).  LET YOUR HEALTH CARE PROVIDER KNOW ABOUT:   Any allergies you have.  All medicines you are taking, including vitamins, herbs, eye drops, creams, and over-the-counter medicines such as aspirin.  Previous problems you or members of your family have had with the use of anesthetics.  Any blood disorders or blood clotting disorders you have.  Previous surgeries you have had.  Medical conditions you have. RISKS AND COMPLICATIONS Generally, this is a safe procedure. However, as with any procedure, complications can occur. Possible complications of epidural steroid injection include:  Headache.  Bleeding.  Infection.  Allergic reaction to the medicines.  Damage to your nerves. The response to this procedure depends on the underlying cause of the pain and its duration. People who have long-term (chronic) pain are less likely to benefit from epidural steroids than are those people whose pain comes on strong and suddenly. BEFORE THE PROCEDURE   Ask your health care provider about changing or stopping your regular medicines. You may be advised to stop taking blood-thinning medicines a few days before the procedure.  You may be given medicines to reduce anxiety.  Arrange for someone to take you home after the procedure. PROCEDURE   You will remain awake during the procedure. You may receive medicine to make you relaxed.  You will be asked to lie on your stomach.  The injection site will be cleaned.  The injection site will be numbed with a medicine (local anesthetic).  A needle will be  injected through your skin into the epidural space.  Your health care provider will use an X-ray machine to ensure that the steroid is delivered closest to the affected nerve. You may have minimal discomfort at this time.  Once the needle is in the right position, the local anesthetic and the steroid will be injected into the epidural space.  The needle will then be removed and a bandage will be applied to the injection site. AFTER THE PROCEDURE   You may be monitored for a short time before you go home.  You may feel weakness or numbness in your arm or leg, which disappears within hours.  You may be allowed to eat, drink, and take your regular medicine.  You may have soreness at the site of the injection.   This information is not intended to replace advice given to you by your health care provider. Make sure you discuss any questions you have with your health care provider.   Document Released: 12/28/2007 Document Revised: 05/23/2013 Document Reviewed: 03/09/2013 Elsevier Interactive Patient Education 2016 [employer] Facet Joint Block The facet joints connect the bones of the spine (vertebrae). They make it possible for you to bend, twist, and make other movements with your spine. They also prevent you from overbending, overtwisting, and making other excessive movements.  A facet joint block is a procedure where a numbing medicine (anesthetic) is injected into a facet joint. Often, a type of anti-inflammatory medicine called a steroid is also injected. A facet joint block may be done for two reasons:   Diagnosis. A facet joint block may be done as a test   to see whether neck or back pain is caused by a worn-down or infected facet joint. If the pain gets better after a facet joint block, it means the pain is probably coming from the facet joint. If the pain does not get better, it means the pain is probably not coming from the facet joint.   Therapy. A facet joint block may be done to  relieve neck or back pain caused by a facet joint. A facet joint block is only done as a therapy if the pain does not improve with medicine, exercise programs, physical therapy, and other forms of pain management. LET YOUR HEALTH CARE PROVIDER KNOW ABOUT:   Any allergies you have.   All medicines you are taking, including vitamins, herbs, eyedrops, and over-the-counter medicines and creams.   Previous problems you or members of your family have had with the use of anesthetics.   Any blood disorders you have had.   Other health problems you have. RISKS AND COMPLICATIONS Generally, having a facet joint block is safe. However, as with any procedure, complications can occur. Possible complications associated with having a facet joint block include:   Bleeding.   Injury to a nerve near the injection site.   Pain at the injection site.   Weakness or numbness in areas controlled by nerves near the injection site.   Infection.   Temporary fluid retention.   Allergic reaction to anesthetics or medicines used during the procedure. BEFORE THE PROCEDURE   Follow your health care provider's instructions if you are taking dietary supplements or medicines. You may need to stop taking them or reduce your dosage.   Do not take any new dietary supplements or medicines without asking your health care provider first.   Follow your health care provider's instructions about eating and drinking before the procedure. You may need to stop eating and drinking several hours before the procedure.   Arrange to have an adult drive you home after the procedure. PROCEDURE  You may need to remove your clothing and dress in an open-back gown so that your health care provider can access your spine.   The procedure will be done while you are lying on an X-ray table. Most of the time you will be asked to lie on your stomach, but you may be asked to lie in a different position if an injection will  be made in your neck.   Special machines will be used to monitor your oxygen levels, heart rate, and blood pressure.   If an injection will be made in your neck, an intravenous (IV) tube will be inserted into one of your veins. Fluids and medicine will flow directly into your body through the IV tube.   The area over the facet joint where the injection will be made will be cleaned with an antiseptic soap. The surrounding skin will be covered with sterile drapes.   An anesthetic will be applied to your skin to make the injection area numb. You may feel a temporary stinging or burning sensation.   A video X-ray machine will be used to locate the joint. A contrast dye may be injected into the facet joint area to help with locating the joint.   When the joint is located, an anesthetic medicine will be injected into the joint through the needle.   Your health care provider will ask you whether you feel pain relief. If you do feel relief, a steroid may be injected to provide pain relief for a   longer period of time. If you do not feel relief or feel only partial relief, additional injections of an anesthetic may be made in other facet joints.   The needle will be removed, the skin will be cleansed, and bandages will be applied.  AFTER THE PROCEDURE   You will be observed for 15-30 minutes before being allowed to go home. Do not drive. Have an adult drive you or take a taxi or public transportation instead.   If you feel pain relief, the pain will return in several hours or days when the anesthetic wears off.   You may feel pain relief 2-14 days after the procedure. The amount of time this relief lasts varies from person to person.   It is normal to feel some tenderness over the injected area(s) for 2 days following the procedure.   If you have diabetes, you may have a temporary increase in blood sugar.   This information is not intended to replace advice given to you by your  health care provider. Make sure you discuss any questions you have with your health care provider.   Document Released: 02/09/2007 Document Revised: 10/11/2014 Document Reviewed: 07/10/2012 Elsevier Interactive Patient Education 2016 Elsevier Inc.  

## 2016-03-22 NOTE — Progress Notes (Signed)
Patient's Name: Caleb Edwards  Patient type: Established  MRN: [id number]  Service setting: Ambulatory outpatient  DOB: 1951/07/21  Location: [facility]  DOS: 03/22/2016  Primary Care Physician: [facility]  Note by: Kathlen Brunswick. Dossie Arbour, M.D, DABA, DABAPM, DABPM, DABIPP, FIPP  Referring Physician: No ref. provider found  Specialty: Board-Certified Interventional Pain Management  Last Visit to Pain Management: 12/22/2015   Primary Reason(s) for Visit: Encounter for prescription drug management (Level of risk: moderate) CC: Back Pain   HPI  Caleb Edwards is a 65 y.o. year old, male patient, who returns today as an established patient. 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 His primarily concern today is the Back Pain   Pain Assessment: Self-Reported Pain Score: 7 , clinically he looks like a 1-2/10. Reported level is inconsistent with clinical obrservations Information on the proper use of the pain score provided to the patient today. Pain Type: Chronic pain Pain Location: Back Pain Orientation: Lower, Left Pain Descriptors / Indicators: Tingling, Stabbing Pain Frequency: Intermittent  The patient comes into the clinics today for pharmacological management of his chronic  pain. I last saw this patient on 12/22/2015. The patient  reports that he does not use illicit drugs. His body mass index is 25.08 kg/(m^2).  Date of Last Visit: 12/22/15 Service Provided on Last Visit: Med Refill  Controlled Substance Pharmacotherapy Assessment & REMS (Risk Evaluation and Mitigation Strategy)  Analgesic: Tramadol 50 mg 2 tablets every 6 hours (400 mg/day) Pill Count: Did not bring tramadol for count. Reminded to bring to all appointments. MME/day: 40 mg/day Pharmacokinetics: Onset of action (Liberation/Absorption): Within expected pharmacological parameters Time to Peak effect (Distribution): Timing and results are as within normal expected parameters Duration of action (Metabolism/Excretion): Within normal limits for medication Pharmacodynamics: Analgesic Effect: More than 50% Activity Facilitation: Medication(s) allow patient to sit, stand, walk, and do the basic ADLs Perceived Effectiveness: Described as relatively effective, allowing for increase in activities of daily living (ADL) Side-effects or Adverse reactions: None reported Monitoring: [place] PMP: Online review of the past 74-monthperiod conducted. Compliant with practice rules and regulations Last UDS on record: TOXASSURE SELECT 13  Date Value Ref Range Status  12/22/2015 FINAL  Final    Comment:    ==================================================================== TOXASSURE SELECT 13 (MW) ==================================================================== Test                             Result       Flag       Units Drug Present and Declared for Prescription Verification   Tramadol                       PRESENT      EXPECTED   O-Desmethyltramadol            PRESENT      EXPECTED   N-Desmethyltramadol  PRESENT      EXPECTED    Source of tramadol is a prescription medication.    O-desmethyltramadol and N-desmethyltramadol are expected    metabolites of  tramadol. ==================================================================== Test                      Result    Flag   Units      Ref Range   Creatinine              129              mg/dL      >=20 ==================================================================== Declared Medications:  The flagging and interpretation on this report are based on the  following declared medications.  Unexpected results may arise from  inaccuracies in the declared medications.  **Note: The testing scope of this panel includes these medications:  Tramadol (Ultram)  **Note: The testing scope of this panel does not include following  reported medications:  Aspirin  Caffeine  Cyanocobalamin  Herbal Product  Hydrochlorothiazide (Prinzide)  Lisinopril (Prinzide)  Naproxen (Anaprox)  Turmeric ==================================================================== For clinical consultation, please call [phone]. ====================================================================    UDS interpretation: Compliant          Medication Assessment Form: Reviewed. Patient indicates being compliant with therapy Treatment compliance: Compliant Risk Assessment: Aberrant Behavior: None observed today Substance Use Disorder (SUD) Risk Level: Low Risk of opioid abuse or dependence: 0.7-3.0% with doses ? 36 MME/day and 6.1-26% with doses ? 120 MME/day. Opioid Risk Tool (ORT) Score: Total Score: 0 Low Risk for SUD (Score <3) Depression Scale Score: PHQ-2: PHQ-2 Total Score: 0 No depression (0) PHQ-9: PHQ-9 Total Score: 0 No depression (0-4)  Pharmacologic Plan: No change in therapy, at this time  Laboratory Chemistry  Inflammation Markers Lab Results  Component Value Date   ESRSEDRATE 3 12/26/2015   CRP 0.6 12/26/2015    Renal Function Lab Results  Component Value Date   BUN 9 01/04/2016   CREATININE 1.07 01/04/2016   GFRAA >60 01/04/2016   GFRNONAA >60 01/04/2016    Hepatic Function Lab  Results  Component Value Date   AST 18 01/02/2016   ALT 13* 01/02/2016   ALBUMIN 3.7 01/02/2016    Electrolytes Lab Results  Component Value Date   NA 139 01/04/2016   K 3.4* 01/04/2016   CL 113* 01/04/2016   CALCIUM 8.1* 01/04/2016   MG 1.9 01/04/2016    Pain Modulating Vitamins Lab Results  Component Value Date   VD25OH 24.0* 12/26/2015   VITAMINB12 613 12/26/2015    Coagulation Parameters Lab Results  Component Value Date   INR 1.24 01/03/2016   LABPROT 15.8* 01/03/2016   PLT 145* 01/04/2016    Note: Labs Reviewed.  Recent Diagnostic Imaging  Ct Angio Chest Pe W/cm &/or Wo Cm  01/02/2016  CLINICAL DATA:  65 year old male nausea and vomiting, shortness of breath, coffee ground emesis, abdominal and constipation for 10 days. EXAM: CT ANGIOGRAPHY CHEST CT ABDOMEN AND PELVIS WITH CONTRAST TECHNIQUE: Multidetector CT imaging of the chest was performed using the standard protocol during bolus administration of intravenous contrast. Multiplanar CT image reconstructions and MIPs were obtained to evaluate the vascular anatomy. Multidetector CT imaging of the abdomen and pelvis was performed using the standard protocol during bolus administration of intravenous contrast. CONTRAST:  100 cc intravenous Isovue 370 COMPARISON:  01/02/2016 and prior chest radiographs. 06/14/2012 chest CT. FINDINGS: CTA CHEST FINDINGS Mediastinum/Nodes: No pulmonary emboli identified. There is no evidence of thoracic aortic  aneurysm or definite dissection. No enlarged lymph nodes, mediastinal mass or pericardial effusion. Lungs/Pleura: There is no evidence of airspace disease, consolidation, suspicious nodule/mass or endobronchial/ endotracheal lesion. Tiny subpleural nodules bilaterally, some which are calcified, are unchanged. There is no evidence of pleural effusion or pneumothorax. Musculoskeletal: No acute or suspicious abnormalities. CT ABDOMEN and PELVIS FINDINGS Hepatobiliary: The liver and gallbladder  are unremarkable. There is no evidence of biliary dilatation. Pancreas: Unremarkable Spleen: Unremarkable Adrenals/Urinary Tract: Nonobstructing bilateral renal calculi are identified. There is no evidence of hydronephrosis or renal mass. The adrenal glands and bladder are unremarkable. Stomach/Bowel: Colonic diverticulosis noted without evidence of diverticulitis. There is no evidence of bowel obstruction, definite focal bowel wall thickening or pneumoperitoneum. Vascular/Lymphatic: Aortic atherosclerotic calcifications noted without aneurysm. No enlarged lymph nodes. Reproductive: Unremarkable Other: No free fluid or abscess. Musculoskeletal: No acute or suspicious abnormalities. Grade 1 spondylolisthesis of L5 on S1 noted with facet arthropathy in the lower lumbar spine. Review of the MIP images confirms the above findings. IMPRESSION: No evidence of acute abnormality. Nonobstructing bilateral renal calculi, colonic diverticulosis and aortic atherosclerosis. Electronically Signed   By: Margarette Canada M.D.   On: 01/02/2016 14:26   Ct Abdomen Pelvis W Contrast  01/02/2016  CLINICAL DATA:  65 year old male nausea and vomiting, shortness of breath, coffee ground emesis, abdominal and constipation for 10 days. EXAM: CT ANGIOGRAPHY CHEST CT ABDOMEN AND PELVIS WITH CONTRAST TECHNIQUE: Multidetector CT imaging of the chest was performed using the standard protocol during bolus administration of intravenous contrast. Multiplanar CT image reconstructions and MIPs were obtained to evaluate the vascular anatomy. Multidetector CT imaging of the abdomen and pelvis was performed using the standard protocol during bolus administration of intravenous contrast. CONTRAST:  100 cc intravenous Isovue 370 COMPARISON:  01/02/2016 and prior chest radiographs. 06/14/2012 chest CT. FINDINGS: CTA CHEST FINDINGS Mediastinum/Nodes: No pulmonary emboli identified. There is no evidence of thoracic aortic aneurysm or definite dissection. No  enlarged lymph nodes, mediastinal mass or pericardial effusion. Lungs/Pleura: There is no evidence of airspace disease, consolidation, suspicious nodule/mass or endobronchial/ endotracheal lesion. Tiny subpleural nodules bilaterally, some which are calcified, are unchanged. There is no evidence of pleural effusion or pneumothorax. Musculoskeletal: No acute or suspicious abnormalities. CT ABDOMEN and PELVIS FINDINGS Hepatobiliary: The liver and gallbladder are unremarkable. There is no evidence of biliary dilatation. Pancreas: Unremarkable Spleen: Unremarkable Adrenals/Urinary Tract: Nonobstructing bilateral renal calculi are identified. There is no evidence of hydronephrosis or renal mass. The adrenal glands and bladder are unremarkable. Stomach/Bowel: Colonic diverticulosis noted without evidence of diverticulitis. There is no evidence of bowel obstruction, definite focal bowel wall thickening or pneumoperitoneum. Vascular/Lymphatic: Aortic atherosclerotic calcifications noted without aneurysm. No enlarged lymph nodes. Reproductive: Unremarkable Other: No free fluid or abscess. Musculoskeletal: No acute or suspicious abnormalities. Grade 1 spondylolisthesis of L5 on S1 noted with facet arthropathy in the lower lumbar spine. Review of the MIP images confirms the above findings. IMPRESSION: No evidence of acute abnormality. Nonobstructing bilateral renal calculi, colonic diverticulosis and aortic atherosclerosis. Electronically Signed   By: Margarette Canada M.D.   On: 01/02/2016 14:26   Dg Abd Acute W/chest  01/02/2016  CLINICAL DATA:  Shortness of breath, vomiting and left abdominal pain. EXAM: DG ABDOMEN ACUTE W/ 1V CHEST COMPARISON:  06/14/2012 chest radiograph. FINDINGS: Stable cardiomediastinal silhouette with normal heart size. No pneumothorax. No pleural effusion. Lungs appear clear, with no acute consolidative airspace disease and no pulmonary edema. No dilated small bowel loops or air-fluid levels. Physiologic  stool in the  colon. No evidence of pneumatosis or pneumoperitoneum. Bilateral renal stones measuring 6 mm in the lower right kidney and 4 mm in the lower left kidney. IMPRESSION: 1. No active cardiopulmonary disease. 2. Nonobstructive bowel gas pattern. 3. Bilateral renal stones. Electronically Signed   By: Ilona Sorrel M.D.   On: 01/02/2016 12:00   Cervical Imaging: Cervical MR wo contrast:  Results for orders placed in visit on 03/26/13  MR C Spine Ltd W/O Cm   Narrative * PRIOR REPORT IMPORTED FROM AN EXTERNAL SYSTEM *   PRIOR REPORT IMPORTED FROM THE SYNGO [place] EXAM:    neck pain cervical radiculitis  COMMENTS:   PROCEDURE:     MMR - MMR CERVICAL SPINE WO CONT  - Mar 26 2013  1:52PM   RESULT:   Technique:  Multiplanar and multisequence imaging of the cervical spine  was  obtained without administration of Gadolinium.   Findings:  Evaluation of the cervical cord demonstrates no T1 or T2 signal  abnormalities.   The craniocervical junction is intact.   At the C2-3 level, there is no evidence of significant thecal sac stenosis  or neuroforaminal narrowing.   At the C3-4 level, a broad-based disc bulge is appreciated causing partial  effacement of the anterior CSF space. There is lateralization to the right  and left with mild to moderate neuroforaminal narrowing. Exiting nerve  root  compromise particularly on the left is of diagnostic consideration.   At the C4-5 level, a broad-based disc bulge is appreciated causing mild  effacement of the anterior CSF space. There is bilateral mild to moderate  neuroforaminal narrowing secondary to lateralization of the disc bulge as  well as facet hypertrophy. Bilateral exiting nerve root compromise, right  greater than left, is of diagnostic consideration.   At the C5-6 level, a broad-based disc bulge is appreciated causing partial  effacement of the anterior CSF space and demonstrating lateralization.   Bilateral moderate to severe neuroforaminal narrowing is appreciated also  secondary to facet hypertrophy. Exiting nerve root compromise with  possible  compression bilaterally is of diagnostic consideration.   At the C6-7 level, a broad-based disc bulge is appreciated causing partial  effacement of the anterior CSF space. There is bilateral neuroforaminal  narrowing, moderate to severe on the left and moderate on the right.  Bilateral exiting nerve root compromise with possible compression on the  left is of diagnostic consideration.   At the C7-T1 level, there is no evidence of thecal sac stenosis or  neuroforaminal narrowing.   The osseous structures demonstrate no evidence of marrow edema or linear  signal void.   IMPRESSION:      Multilevel, multifactorial spondylolysis with areas of  thecal sac stenosis and neuroforaminal narrowing. There are regions  concerning for exiting nerve root compromise as well as compression as  described above particularly on the left. If clinically warranted,  surgical  consultation is recommended.   Thank you for this opportunity to contribute to the care of your patient.       Cervical DG complete:  Results for orders placed in visit on 01/10/13  DG Cervical Spine Complete   Narrative * PRIOR REPORT IMPORTED FROM AN EXTERNAL SYSTEM *   PRIOR REPORT IMPORTED FROM THE [facility]GO WORKFLOW SYSTEM   REASON FOR EXAM:    neck pain and cervical radiculitis  COMMENTS:   PROCEDURE:     DXR - DXR CERVICAL SPINE COMPLETE  - Jan 10 2013 11:05AM  RESULT:     The cervical vertebral bodies are preserved in height. There  is  disc space narrowing at C6-C7. Anterior endplate osteophytes are noted at  multiple levels. The oblique views reveal bony encroachment upon the  neural  foramina at multiple levels bilaterally. There is no evidence of a perched  facet. The spinous processes are intact. The odontoid is intact.   IMPRESSION:      There is  degenerative disc change at C6-C7 with milder  degenerative disc changes elsewhere. There is bony encroachment upon the  neural foramina at multiple levels due to facet joint hypertrophy and  uncovertebral joint hypertrophy. Followup MRI would be useful if the  patient  can tolerate the procedure.   Dictation Site: 2       Knee Imaging: Knee-R MR wo contrast:  Results for orders placed in visit on 11/08/06  MR Knee Right Wo Contrast   Narrative * PRIOR REPORT IMPORTED FROM AN EXTERNAL SYSTEM *   PRIOR REPORT IMPORTED FROM THE [facility] WORKFLOW SYSTEM   REASON FOR EXAM:    Right knee pain  COMMENTS:   PROCEDURE:     MR  - MR KNEE RT  WO  CONTRAST  - Nov 08 2006  8:03PM   RESULT:     The patient is complaining of RIGHT knee discomfort and  sensation of fullness in the popliteal fossa region.   Multiplanar images were obtained through the knee.   There is a moderate amount of fluid present in the suprapatellar bursa.  There is also abnormal soft tissue signal in this that may reflect the  presence of synovitis. The signal is not low as might be expected with  pigmented villonodular synovitis. I do not see evidence of bone marrow  edema. The ACL and PCL are intact. There is abnormal signal in the  posterior  horn of the medial meniscus. Just medial to the tibial insertion of the  PCL  there is additional soft tissue signal that may reflect the sequelae of  synovitis. The articular cartilage of the patella is normal in appearance  for age. The medial and lateral collateral ligament complexes are intact.  The lateral meniscus is normal in appearance.   IMPRESSION:   1.     I do not see evidence of a popliteal cyst. However, there is  abnormal  soft tissue signal within the joint compartment, most prominently in the  suprapatellar bursa but also just medial to the tibial insertion of the  PCL.  The findings here may reflect synovitis. Arthroscopy would likely be the  most  useful next step.  2.     There is a tear of the posterior horn of the medial meniscus. The  lateral meniscus is intact.  3.     The ACL and PCL are intact.  4.     I see no evidence of bone marrow edema.   Thank you for this opportunity to contribute to the care of your patient.       Note: Imaging reviewed.  Meds  The patient has a current medication list which includes the following prescription(s): amlodipine, vitamin d3, ferrous sulfate, lisinopril-hydrochlorothiazide, pantoprazole, tramadol, and turmeric.  Current Outpatient Prescriptions on File Prior to Visit  Medication Sig  . Cholecalciferol (VITAMIN D3) 2000 units capsule Take 1 capsule (2,000 Units total) by mouth daily.  . ferrous sulfate 325 (65 FE) MG tablet Take 1 tablet (325 mg total) by mouth 3 (three) times daily with  meals.  . pantoprazole (PROTONIX) 40 MG tablet Take 1 tablet (40 mg total) by mouth 2 (two) times daily before a meal.   No current facility-administered medications on file prior to visit.    ROS  Constitutional: Denies any fever or chills Gastrointestinal: No reported hemesis, hematochezia, vomiting, or acute GI distress Musculoskeletal: Denies any acute onset joint swelling, redness, loss of ROM, or weakness Neurological: No reported episodes of acute onset apraxia, aphasia, dysarthria, agnosia, amnesia, paralysis, loss of coordination, or loss of consciousness  Allergies  Mr. Arnette has No Known Allergies.  [place]  Medical:  Mr. Lott  has a past medical history of Hypertension and Back pain. Family: family history includes Diabetes in his mother; Heart disease in his father. Surgical:  has past surgical history that includes Esophagogastroduodenoscopy (egd) with propofol (N/A, 01/03/2016). Tobacco:  reports that he has quit smoking. He does not have any smokeless tobacco history on file. Alcohol:  reports that he does not drink alcohol. Drug:  reports that he does not use illicit  drugs.  Constitutional Exam  Vitals: Blood pressure 156/84, pulse 81, temperature 98.5 F (36.9 C), resp. rate 16, height 6' (1.829 m), weight 185 lb (83.915 kg), SpO2 81 %. General appearance: Well nourished, well developed, and well hydrated. In no acute distress Calculated BMI/Body habitus: Body mass index is 25.08 kg/(m^2).       Psych/Mental status: Alert and oriented x 3 (person, place, & time) Eyes: PERLA Respiratory: No evidence of acute respiratory distress  Cervical Spine Exam  Inspection: No masses, redness, or swelling Alignment: Symmetrical ROM: Functional: ROM is within functional limits [facility]) Stability: No instability detected Muscle strength & Tone: Functionally intact Sensory: Unimpaired Palpation: No complaints of tenderness  Upper Extremity (UE) Exam    Side: Right upper extremity  Side: Left upper extremity  Inspection: No masses, redness, swelling, or asymmetry  Inspection: No masses, redness, swelling, or asymmetry  ROM:  ROM:  Functional: ROM is within functional limits [facility])  Functional: ROM is within functional limits [facility])  Muscle strength & Tone: Functionally intact  Muscle strength & Tone: Functionally intact  Sensory: Unimpaired  Sensory: Unimpaired  Palpation: Non-contributory  Palpation: Non-contributory   Thoracic Spine Exam  Inspection: No masses, redness, or swelling Alignment: Symmetrical ROM: Functional: ROM is within functional limits [facility]) Stability: No instability detected Sensory: Unimpaired Muscle strength & Tone: Functionally intact Palpation: No complaints of tenderness  Lumbar Spine Exam  Inspection: No masses, redness, or swelling Alignment: Symmetrical ROM: Functional: Decreased ROM Stability: No instability detected Muscle strength & Tone: Functionally intact Sensory: Unimpaired Palpation: Tender Provocative Tests: Lumbar Hyperextension and rotation test: Positive for bilateral lumbar facet pain Patrick's Maneuver:  deferred  Gait & Posture Assessment  Ambulation: Unassisted Gait: Unaffected Posture: WNL  Lower Extremity Exam    Side: Right lower extremity  Side: Left lower extremity  Inspection: No masses, redness, swelling, or asymmetry ROM:  Inspection: No masses, redness, swelling, or asymmetry ROM:  Functional: ROM is within functional limits [facility])  Functional: ROM is within functional limits [facility])  Muscle strength & Tone: Functionally intact  Muscle strength & Tone: Functionally intact  Sensory: Unimpaired  Sensory: Unimpaired  Palpation: Non-contributory  Palpation: Non-contributory   Assessment & Plan  Primary Diagnosis & Pertinent Problem List: The primary encounter diagnosis was Chronic pain. 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  Visit Diagnosis: 1. Chronic pain   2. Encounter for therapeutic drug level monitoring   3. Long term current use of opiate analgesic   4. Chronic low back pain (Location of Primary Source of Pain) (Bilateral) (R>L)   5. Lumbar facet syndrome (Location of Primary Source of Pain) (Bilateral) (R>L)   6. 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 Vitamin D insufficiency     Problems updated and reviewed during this visit: No problems updated.  Problem-specific Plan(s): No problem-specific assessment & plan notes found for this encounter.  No new assessment & plan notes have been filed under this hospital service since the last note was generated. 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 traMADol (ULTRAM) 50 MG tablet    Sig: Take 2 tablets (100 mg total) by mouth every 6 (six) hours as needed for severe pain.    Dispense:  240 tablet    Refill:  5    Do not add this medication to the electronic "Automatic Refill" notification system. Patient may have prescription filled one day early if pharmacy is closed on scheduled refill date. Do not fill until: 03/22/16 To last until: 09/21/16    [facility] & Procedure Ordered: Orders Placed This Encounter  Procedures  . LUMBAR FACET(MEDIAL BRANCH NERVE BLOCK) MBNB  . Lumbar Transforaminal epidural without steroid    Imaging Ordered: None  Interventional Therapies: Scheduled:  None at this time.    Considering:   1. Diagnostic bilateral lumbar facet block under fluoroscopic guidance and IV sedation.  2. Possible bilateral lumbar facet radiofrequency ablation. 3. Diagnostic bilateral L4-5 transforaminal epidural steroid injection under fluoroscopic guidance and IV sedation.    PRN Procedures:   1. Diagnostic bilateral lumbar facet block under fluoroscopic guidance and IV sedation.  2. Diagnostic bilateral L4-5 transforaminal epidural steroid injection under  fluoroscopic guidance and IV sedation.    Referral(s) or Consult(s): None at this time.  New Prescriptions   No medications on file    Medications administered during this visit: Mr. Aust had no medications administered during this visit.  Requested PM Follow-up: Return in about 6 months (around 09/06/2016) for Procedure (PRN - Patient will call).  No future appointments.  Primary Care Physician: [place] Location: [facility] Note by: Kathlen Brunswick. Dossie Arbour, M.D, DABA, DABAPM, DABPM, DABIPP, FIPP  Pain Score Disclaimer: We use  the NRS-11 scale. This is a self-reported, subjective measurement of pain severity with only modest accuracy. It is used primarily to identify changes within a particular patient. It must be understood that outpatient pain scales are significantly less accurate that those used for research, where they can be applied under ideal controlled circumstances with minimal exposure to variables. In reality, the score is likely to be a combination of pain intensity and pain affect, where pain affect describes the degree of emotional arousal or changes in action readiness caused by the sensory experience of pain. Factors such as social and work situation, setting, emotional state, anxiety levels, expectation, and prior pain experience may influence pain perception and show large inter-individual differences that may also be affected by time variables.  Patient instructions provided during this appointment: There are no Patient Instructions on file for this visit.

## 2016-03-22 NOTE — Progress Notes (Signed)
Safety precautions to be maintained throughout the outpatient stay will include: orient to surroundings, keep bed in low position, maintain call bell within reach at all times, provide assistance with transfer out of bed and ambulation. Did not bring tramadol for count.  Reminded to bring to all appointments.

## 2016-09-06 ENCOUNTER — Ambulatory Visit: Payer: BLUE CROSS/BLUE SHIELD | Admitting: Pain Medicine

## 2016-09-09 ENCOUNTER — Ambulatory Visit: Payer: BLUE CROSS/BLUE SHIELD | Attending: Pain Medicine | Admitting: Pain Medicine

## 2016-09-09 ENCOUNTER — Encounter: Payer: Self-pay | Admitting: Pain Medicine

## 2016-09-09 VITALS — BP 182/88 | HR 58 | Temp 98.0°F | Resp 16 | Ht 72.0 in | Wt 188.0 lb

## 2016-09-09 DIAGNOSIS — Z7982 Long term (current) use of aspirin: Secondary | ICD-10-CM | POA: Diagnosis not present

## 2016-09-09 DIAGNOSIS — Z87891 Personal history of nicotine dependence: Secondary | ICD-10-CM | POA: Insufficient documentation

## 2016-09-09 DIAGNOSIS — G8929 Other chronic pain: Secondary | ICD-10-CM

## 2016-09-09 DIAGNOSIS — M5126 Other intervertebral disc displacement, lumbar region: Secondary | ICD-10-CM | POA: Diagnosis not present

## 2016-09-09 DIAGNOSIS — M479 Spondylosis, unspecified: Secondary | ICD-10-CM | POA: Diagnosis not present

## 2016-09-09 DIAGNOSIS — M5442 Lumbago with sciatica, left side: Secondary | ICD-10-CM | POA: Diagnosis not present

## 2016-09-09 DIAGNOSIS — M542 Cervicalgia: Secondary | ICD-10-CM | POA: Diagnosis not present

## 2016-09-09 DIAGNOSIS — M5416 Radiculopathy, lumbar region: Secondary | ICD-10-CM

## 2016-09-09 DIAGNOSIS — M47816 Spondylosis without myelopathy or radiculopathy, lumbar region: Secondary | ICD-10-CM

## 2016-09-09 DIAGNOSIS — Z8249 Family history of ischemic heart disease and other diseases of the circulatory system: Secondary | ICD-10-CM | POA: Insufficient documentation

## 2016-09-09 DIAGNOSIS — Z833 Family history of diabetes mellitus: Secondary | ICD-10-CM | POA: Insufficient documentation

## 2016-09-09 DIAGNOSIS — M48061 Spinal stenosis, lumbar region without neurogenic claudication: Secondary | ICD-10-CM | POA: Insufficient documentation

## 2016-09-09 DIAGNOSIS — F1190 Opioid use, unspecified, uncomplicated: Secondary | ICD-10-CM

## 2016-09-09 DIAGNOSIS — M4316 Spondylolisthesis, lumbar region: Secondary | ICD-10-CM | POA: Diagnosis not present

## 2016-09-09 DIAGNOSIS — Z79891 Long term (current) use of opiate analgesic: Secondary | ICD-10-CM

## 2016-09-09 DIAGNOSIS — I10 Essential (primary) hypertension: Secondary | ICD-10-CM | POA: Diagnosis not present

## 2016-09-09 DIAGNOSIS — E559 Vitamin D deficiency, unspecified: Secondary | ICD-10-CM | POA: Diagnosis not present

## 2016-09-09 DIAGNOSIS — D649 Anemia, unspecified: Secondary | ICD-10-CM | POA: Diagnosis not present

## 2016-09-09 DIAGNOSIS — G894 Chronic pain syndrome: Secondary | ICD-10-CM | POA: Diagnosis not present

## 2016-09-09 DIAGNOSIS — M1288 Other specific arthropathies, not elsewhere classified, other specified site: Secondary | ICD-10-CM

## 2016-09-09 MED ORDER — TRAMADOL HCL 50 MG PO TABS: 100.0000 mg | ORAL_TABLET | Freq: Four times a day (QID) | ORAL | 5 refills | Status: DC | PRN

## 2016-09-09 NOTE — Progress Notes (Signed)
Patient's Name: Caleb Edwards  MRN: [id number]  Referring Provider: Inc, [place]: 1951/04/15  PCP: [employer]  DOS: 09/09/2016  Note by: Kathlen Brunswick. Dossie Arbour, MD  Service setting: Ambulatory outpatient  Specialty: Interventional Pain Management  Location: [facility] (AMB) Pain Management Facility    Patient type: Established   Primary Reason(s) for Visit: Encounter for prescription drug management (Level of risk: moderate) CC: Back Pain (lower, left)  HPI  Mr. Aguayo is a 65 y.o. year old, male patient, who comes today for a medication management evaluation. 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 His primarily concern today is the Back Pain (lower, left)  Pain Assessment: Self-Reported Pain Score: 4 /10             Reported level is compatible with observation.       Pain Type: Chronic pain Pain Location: Back Pain Orientation: Lower, Left Pain Descriptors / Indicators: Aching Pain Frequency: Intermittent  Mr. Amborn was last seen on 03/22/2016 for medication management. During today's appointment we reviewed Mr. Alomar chronic pain status, as well as his outpatient medication regimen.  The patient  reports that he does not use drugs. His body mass index is 25.5  kg/m.  Further details on both, my assessment(s), as well as the proposed treatment plan, please see below.  Controlled Substance Pharmacotherapy Assessment REMS (Risk Evaluation and Mitigation Strategy)  Analgesic: Tramadol 50 mg 2 tablets every 6 hours (400 mg/day) MME/day: 40 mg/day Janett Billow, RN  09/09/2016  8:34 AM  Sign at close encounter Nursing Pain Medication Assessment:  Safety precautions to be maintained throughout the outpatient stay will include: orient to surroundings, keep bed in low position, maintain call bell within reach at all times, provide assistance with transfer out of bed and ambulation.  Medication Inspection Compliance: Mr. Feig did not comply with our request to bring his pills to be counted. He was reminded that bringing the medication bottles, even when empty, is a requirement. Pill Count: No pills available to be counted today. Bottle Appearance: No container available. Did not bring bottle(s) to appointment. Medication: None brought in. Filled Date: N/A Medication last intake: 09/09/16   Pharmacokinetics: Liberation and absorption (onset of action): WNL Distribution (time to peak effect): WNL Metabolism and excretion (duration of action): WNL         Pharmacodynamics: Desired effects: Analgesia: Mr. Pickerill reports >50% benefit. Functional ability: Patient reports that medication allows him to accomplish basic ADLs Clinically meaningful improvement in function (CMIF): Sustained CMIF goals met Perceived effectiveness: Described as relatively effective, allowing for increase in activities of daily living (ADL) Undesirable effects: Side-effects or Adverse reactions: None reported Monitoring: [place] PMP: Online review of the past 38-monthperiod conducted. Compliant with practice rules and regulations List of all UDS test(s) done:  Lab Results  Component Value Date   TOXASSSELUR FINAL 12/22/2015   [place] 09/24[place]2015   Last UDS on  record:  ToxAssure Select 13  Date Value Ref Range Status  12/22/2015 FINAL  Final    Comment:    ==================================================================== TOXASSURE SELECT 13 (MW) ==================================================================== Test                             Result       Flag       Units Drug Present and Declared for Prescription Verification   Tramadol                       PRESENT      EXPECTED   O-Desmethyltramadol            PRESENT      EXPECTED   N-Desmethyltramadol            PRESENT      EXPECTED    Source of tramadol is a prescription medication.    O-desmethyltramadol and N-desmethyltramadol are expected    metabolites of tramadol. ==================================================================== Test                      Result    Flag   Units      Ref Range   Creatinine              129              mg/dL      >=20 ==================================================================== Declared Medications:  The flagging and interpretation on this report are based on the  following declared medications.  Unexpected results may arise from  inaccuracies in the declared medications.  **Note: The testing scope of this panel includes these medications:  Tramadol (Ultram)  **Note: The testing scope of this panel does not include following  reported medications:  Aspirin  Caffeine  Cyanocobalamin  Herbal Product  Hydrochlorothiazide (Prinzide)  Lisinopril (Prinzide)  Naproxen (Anaprox)  Turmeric ==================================================================== For clinical consultation, please call [phone]. ====================================================================    UDS interpretation: Compliant          Medication Assessment Form: Reviewed. Patient indicates being compliant with therapy Treatment compliance: Compliant Risk Assessment Profile: Aberrant behavior: See prior evaluations. None observed or detected  today Comorbid factors increasing risk of overdose: See prior notes. No additional risks detected today Risk of substance use disorder (SUD): Low Opioid Risk Tool (ORT) Total Score: 0  Interpretation Table:  Score <3 = Low Risk for SUD  Score between 4-7 = Moderate Risk for SUD  Score >8 = High Risk for Opioid Abuse   Risk Mitigation Strategies:  Patient Counseling: Covered Patient-Prescriber Agreement (PPA): Present and active  Notification to other healthcare providers: Done  Pharmacologic Plan: No change in therapy, at this time  Laboratory Chemistry  Inflammation Markers Lab Results  Component Value Date   ESRSEDRATE 3 12/26/2015   CRP 0.6 12/26/2015   Renal Function Lab Results  Component Value Date   BUN 9 01/04/2016   CREATININE 1.07 01/04/2016   GFRAA >60 01/04/2016   GFRNONAA >60 01/04/2016   Hepatic Function Lab Results  Component Value Date   AST 18 01/02/2016   ALT 13 (L) 01/02/2016   ALBUMIN 3.7 01/02/2016   Electrolytes Lab Results  Component Value Date   NA 139 01/04/2016   K 3.4 (L) 01/04/2016   CL 113 (H) 01/04/2016   CALCIUM 8.1 (L) 01/04/2016   MG 1.9 01/04/2016   Pain Modulating Vitamins Lab Results  Component Value Date   VD25OH  24.0 (L) 12/26/2015   VITAMINB12 613 12/26/2015   Coagulation Parameters Lab Results  Component Value Date   INR 1.24 01/03/2016   LABPROT 15.8 (H) 01/03/2016   PLT 145 (L) 01/04/2016   Cardiovascular Lab Results  Component Value Date   HGB 7.4 (L) 01/04/2016   HCT 21.7 (L) 01/04/2016   Note: Lab results reviewed.  Recent Diagnostic Imaging Review  Ct Angio Chest Pe W/cm &/or Wo Cm  Result Date: 01/02/2016 CLINICAL DATA:  65 year old male nausea and vomiting, shortness of breath, coffee ground emesis, abdominal and constipation for 10 days. EXAM: CT ANGIOGRAPHY CHEST CT ABDOMEN AND PELVIS WITH CONTRAST TECHNIQUE: Multidetector CT imaging of the chest was performed using the standard protocol during  bolus administration of intravenous contrast. Multiplanar CT image reconstructions and MIPs were obtained to evaluate the vascular anatomy. Multidetector CT imaging of the abdomen and pelvis was performed using the standard protocol during bolus administration of intravenous contrast. CONTRAST:  100 cc intravenous Isovue 370 COMPARISON:  01/02/2016 and prior chest radiographs. 06/14/2012 chest CT. FINDINGS: CTA CHEST FINDINGS Mediastinum/Nodes: No pulmonary emboli identified. There is no evidence of thoracic aortic aneurysm or definite dissection. No enlarged lymph nodes, mediastinal mass or pericardial effusion. Lungs/Pleura: There is no evidence of airspace disease, consolidation, suspicious nodule/mass or endobronchial/ endotracheal lesion. Tiny subpleural nodules bilaterally, some which are calcified, are unchanged. There is no evidence of pleural effusion or pneumothorax. Musculoskeletal: No acute or suspicious abnormalities. CT ABDOMEN and PELVIS FINDINGS Hepatobiliary: The liver and gallbladder are unremarkable. There is no evidence of biliary dilatation. Pancreas: Unremarkable Spleen: Unremarkable Adrenals/Urinary Tract: Nonobstructing bilateral renal calculi are identified. There is no evidence of hydronephrosis or renal mass. The adrenal glands and bladder are unremarkable. Stomach/Bowel: Colonic diverticulosis noted without evidence of diverticulitis. There is no evidence of bowel obstruction, definite focal bowel wall thickening or pneumoperitoneum. Vascular/Lymphatic: Aortic atherosclerotic calcifications noted without aneurysm. No enlarged lymph nodes. Reproductive: Unremarkable Other: No free fluid or abscess. Musculoskeletal: No acute or suspicious abnormalities. Grade 1 spondylolisthesis of L5 on S1 noted with facet arthropathy in the lower lumbar spine. Review of the MIP images confirms the above findings. IMPRESSION: No evidence of acute abnormality. Nonobstructing bilateral renal calculi, colonic  diverticulosis and aortic atherosclerosis. Electronically Signed   By: Margarette Canada M.D.   On: 01/02/2016 14:26   Ct Abdomen Pelvis W Contrast  Result Date: 01/02/2016 CLINICAL DATA:  65 year old male nausea and vomiting, shortness of breath, coffee ground emesis, abdominal and constipation for 10 days. EXAM: CT ANGIOGRAPHY CHEST CT ABDOMEN AND PELVIS WITH CONTRAST TECHNIQUE: Multidetector CT imaging of the chest was performed using the standard protocol during bolus administration of intravenous contrast. Multiplanar CT image reconstructions and MIPs were obtained to evaluate the vascular anatomy. Multidetector CT imaging of the abdomen and pelvis was performed using the standard protocol during bolus administration of intravenous contrast. CONTRAST:  100 cc intravenous Isovue 370 COMPARISON:  01/02/2016 and prior chest radiographs. 06/14/2012 chest CT. FINDINGS: CTA CHEST FINDINGS Mediastinum/Nodes: No pulmonary emboli identified. There is no evidence of thoracic aortic aneurysm or definite dissection. No enlarged lymph nodes, mediastinal mass or pericardial effusion. Lungs/Pleura: There is no evidence of airspace disease, consolidation, suspicious nodule/mass or endobronchial/ endotracheal lesion. Tiny subpleural nodules bilaterally, some which are calcified, are unchanged. There is no evidence of pleural effusion or pneumothorax. Musculoskeletal: No acute or suspicious abnormalities. CT ABDOMEN and PELVIS FINDINGS Hepatobiliary: The liver and gallbladder are unremarkable. There is no evidence of biliary dilatation. Pancreas: Unremarkable Spleen: Unremarkable  Adrenals/Urinary Tract: Nonobstructing bilateral renal calculi are identified. There is no evidence of hydronephrosis or renal mass. The adrenal glands and bladder are unremarkable. Stomach/Bowel: Colonic diverticulosis noted without evidence of diverticulitis. There is no evidence of bowel obstruction, definite focal bowel wall thickening or  pneumoperitoneum. Vascular/Lymphatic: Aortic atherosclerotic calcifications noted without aneurysm. No enlarged lymph nodes. Reproductive: Unremarkable Other: No free fluid or abscess. Musculoskeletal: No acute or suspicious abnormalities. Grade 1 spondylolisthesis of L5 on S1 noted with facet arthropathy in the lower lumbar spine. Review of the MIP images confirms the above findings. IMPRESSION: No evidence of acute abnormality. Nonobstructing bilateral renal calculi, colonic diverticulosis and aortic atherosclerosis. Electronically Signed   By: Margarette Canada M.D.   On: 01/02/2016 14:26   Dg Abd Acute W/chest  Result Date: 01/02/2016 CLINICAL DATA:  Shortness of breath, vomiting and left abdominal pain. EXAM: DG ABDOMEN ACUTE W/ 1V CHEST COMPARISON:  06/14/2012 chest radiograph. FINDINGS: Stable cardiomediastinal silhouette with normal heart size. No pneumothorax. No pleural effusion. Lungs appear clear, with no acute consolidative airspace disease and no pulmonary edema. No dilated small bowel loops or air-fluid levels. Physiologic stool in the colon. No evidence of pneumatosis or pneumoperitoneum. Bilateral renal stones measuring 6 mm in the lower right kidney and 4 mm in the lower left kidney. IMPRESSION: 1. No active cardiopulmonary disease. 2. Nonobstructive bowel gas pattern. 3. Bilateral renal stones. Electronically Signed   By: Ilona Sorrel M.D.   On: 01/02/2016 12:00   Note: Imaging results reviewed.          Meds  The patient has a current medication list which includes the following prescription(s): amlodipine, amlodipine, lisinopril-hydrochlorothiazide, pantoprazole, simvastatin, tramadol, and viagra.  Current Outpatient Prescriptions on File Prior to Visit  Medication Sig  . amLODipine (NORVASC) 2.5 MG tablet Take 2.5 mg by mouth daily.   Marland Kitchen lisinopril-hydrochlorothiazide (PRINZIDE,ZESTORETIC) 20-12.5 MG tablet Take 1 tablet by mouth daily.   . pantoprazole (PROTONIX) 40 MG tablet Take 1  tablet (40 mg total) by mouth 2 (two) times daily before a meal.   No current facility-administered medications on file prior to visit.    ROS  Constitutional: Denies any fever or chills Gastrointestinal: No reported hemesis, hematochezia, vomiting, or acute GI distress Musculoskeletal: Denies any acute onset joint swelling, redness, loss of ROM, or weakness Neurological: No reported episodes of acute onset apraxia, aphasia, dysarthria, agnosia, amnesia, paralysis, loss of coordination, or loss of consciousness  Allergies  Mr. Parkerson has No Known Allergies.  PFSH  Drug: Mr. Southwell  reports that he does not use drugs. Alcohol:  reports that he does not drink alcohol. Tobacco:  reports that he has quit smoking. He has never used smokeless tobacco. Medical:  has a past medical history of Back pain; Blood transfusion without reported diagnosis (12/2015); Hypertension; and Ulcer, stomach peptic, chronic. Family: family history includes Diabetes in his mother; Heart disease in his father.  Past Surgical History:  Procedure Laterality Date  . ESOPHAGOGASTRODUODENOSCOPY (EGD) WITH PROPOFOL N/A 01/03/2016   Procedure: ESOPHAGOGASTRODUODENOSCOPY (EGD) WITH PROPOFOL;  Surgeon: Manya Silvas, MD;  Location: [facility] ENDOSCOPY;  Service: Endoscopy;  Laterality: N/A;   Constitutional Exam  General appearance: Well nourished, well developed, and well hydrated. In no apparent acute distress Vitals:   09/09/16 0832  BP: (!) 182/88  Pulse: (!) 58  Resp: 16  Temp: 98 F (36.7 C)  TempSrc: Oral  SpO2: 100%  Weight: 188 lb (85.3 kg)  Height: 6' (1.829 m)   BMI Assessment: Estimated  body mass index is 25.5 kg/m as calculated from the following:   Height as of this encounter: 6' (1.829 m).   Weight as of this encounter: 188 lb (85.3 kg).  BMI interpretation table: BMI level Category Range association with higher incidence of chronic pain  <18 kg/m2 Underweight   18.5-24.9 kg/m2 Ideal body weight    25-29.9 kg/m2 Overweight Increased incidence by 20%  30-34.9 kg/m2 Obese (Class I) Increased incidence by 68%  35-39.9 kg/m2 Severe obesity (Class II) Increased incidence by 136%  >40 kg/m2 Extreme obesity (Class III) Increased incidence by 254%   BMI Readings from Last 4 Encounters:  09/09/16 25.50 kg/m  03/22/16 25.09 kg/m  01/02/16 24.41 kg/m  12/22/15 24.41 kg/m   Wt Readings from Last 4 Encounters:  09/09/16 188 lb (85.3 kg)  03/22/16 185 lb (83.9 kg)  01/02/16 180 lb (81.6 kg)  12/22/15 180 lb (81.6 kg)  Psych/Mental status: Alert, oriented x 3 (person, place, & time) Eyes: PERLA Respiratory: No evidence of acute respiratory distress  Cervical Spine Exam  Inspection: No masses, redness, or swelling Alignment: Symmetrical Functional ROM: Unrestricted ROM Stability: No instability detected Muscle strength & Tone: Functionally intact Sensory: Unimpaired Palpation: Non-contributory  Upper Extremity (UE) Exam    Side: Right upper extremity  Side: Left upper extremity  Inspection: No masses, redness, swelling, or asymmetry  Inspection: No masses, redness, swelling, or asymmetry  Functional ROM: Unrestricted ROM          Functional ROM: Unrestricted ROM          Muscle strength & Tone: Functionally intact  Muscle strength & Tone: Functionally intact  Sensory: Unimpaired  Sensory: Unimpaired  Palpation: Non-contributory  Palpation: Non-contributory   Thoracic Spine Exam  Inspection: No masses, redness, or swelling Alignment: Symmetrical Functional ROM: Unrestricted ROM Stability: No instability detected Sensory: Unimpaired Muscle strength & Tone: Functionally intact Palpation: Non-contributory  Lumbar Spine Exam  Inspection: No masses, redness, or swelling Alignment: Symmetrical Functional ROM: Unrestricted ROM Stability: No instability detected Muscle strength & Tone: Functionally intact Sensory: Unimpaired Palpation: Non-contributory Provocative  Tests: Lumbar Hyperextension and rotation test: evaluation deferred today       Patrick's Maneuver: evaluation deferred today              Gait & Posture Assessment  Ambulation: Unassisted Gait: Relatively normal for age and body habitus Posture: WNL   Lower Extremity Exam    Side: Right lower extremity  Side: Left lower extremity  Inspection: No masses, redness, swelling, or asymmetry  Inspection: No masses, redness, swelling, or asymmetry  Functional ROM: Unrestricted ROM          Functional ROM: Unrestricted ROM          Muscle strength & Tone: Functionally intact  Muscle strength & Tone: Functionally intact  Sensory: Unimpaired  Sensory: Unimpaired  Palpation: Non-contributory  Palpation: Non-contributory   Assessment  Primary Diagnosis & Pertinent Problem List: The primary encounter diagnosis was Chronic pain syndrome. 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  Visit Diagnosis: 1. Chronic pain syndrome   2. Long term current use of opiate analgesic   3. Opiate use   4. Chronic low back pain (Location of Primary Source of Pain) (Bilateral) (R>L)   5. Lumbar facet  syndrome (Location of Primary Source of Pain) (Bilateral) (R>L)   6. 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 traMADol (ULTRAM) 50 MG tablet    Sig: Take 2 tablets (100 mg total) by mouth every 6 (six) hours as needed for severe pain.    Dispense:  240 tablet    Refill:  5    Do not add this medication to the electronic "Automatic Refill" notification system. Patient may have prescription filled one day early if pharmacy is closed on  scheduled refill date. Do not fill until: 09/21/16 To last until: 03/20/17   New Prescriptions   No medications on file   Medications administered today: Mr. Mckone had no medications administered during this visit. Lab-work, procedure(s), and/or referral(s): Orders Placed This Encounter  Procedures  . Lumbar Epidural Injection  . Lumbar Transforaminal Epidural  . DG Lumbar Spine Complete W/Bend  . ToxASSURE Select 13 (MW), Urine   Imaging and/or referral(s): DG LUMBAR SPINE COMPLETE W/BEND 6+V  Interventional therapies: Planned, scheduled, and/or pending:   Left LESI & Left TFESI in January for his Left L4 Radiculopathy, with fluoro and sedation.   Considering:   Diagnostic bilateral lumbar facet block under fluoroscopic guidance and IV sedation.  Possible bilateral lumbar facet radiofrequency ablation. Diagnostic bilateral L4-5 transforaminal epidural steroid injection under fluoroscopic guidance and IV sedation.    Palliative PRN treatment(s):   Diagnostic bilateral lumbar facet block under fluoroscopic guidance and IV sedation.  Diagnostic bilateral L4-5 transforaminal epidural steroid injection under fluoroscopic guidance and IV sedation.    Provider-requested follow-up: Return in about 6 months (around 03/10/2017) for Med-Mgmt, in addition schedule procedure.  Future Appointments Date Time Provider [place]  10/13/2016 8:30 AM Milinda Pointer, MD [facility] None  03/10/2017 8:15 AM Milinda Pointer, MD [facility] None   Primary Care Physician: [facility] Location: [facility] Note by: Kathlen Brunswick. Dossie Arbour, M.D, DABA, DABAPM, DABPM, DABIPP, FIPP Date: 09/09/16; Time: 11:25 AM  Pain Score Disclaimer: We use the NRS-11 scale. This is a self-reported, subjective measurement of pain severity with only modest accuracy. It is used primarily to identify changes within a particular patient. It must be understood that  outpatient pain scales are significantly less accurate that those used for research, where they can be applied under ideal controlled circumstances with minimal exposure to variables. In reality, the score is likely to be a combination of pain intensity and pain affect, where pain affect describes the degree of emotional arousal or changes in action readiness caused by the sensory experience of pain. Factors such as social and work situation, setting, emotional state, anxiety levels, expectation, and prior pain experience may influence pain perception and show large inter-individual differences that may also be affected by time variables.  Patient instructions provided during this appointment: Patient Instructions   GENERAL RISKS AND COMPLICATIONS  What are the risk, side effects and possible complications? Generally speaking, most procedures are safe.  However, with any procedure there are risks, side effects, and the possibility of complications.  The risks and complications are dependent upon the sites that are lesioned, or the type of nerve block to be performed.  The closer the procedure is to the spine, the more serious the risks are.  Great care is taken when placing the radio frequency needles, block needles or lesioning probes, but sometimes complications can occur. 1. Infection: Any time there  is an injection through the skin, there is a risk of infection.  This is why sterile conditions are used for these blocks.  There are four possible types of infection. 1. Localized skin infection. 2. Central Nervous System Infection-This can be in the form of Meningitis, which can be deadly. 3. Epidural Infections-This can be in the form of an epidural abscess, which can cause pressure inside of the spine, causing compression of the spinal cord with subsequent paralysis. This would require an emergency surgery to decompress, and there are no guarantees that the patient would recover from the  paralysis. 4. Discitis-This is an infection of the intervertebral discs.  It occurs in about 1% of discography procedures.  It is difficult to treat and it may lead to surgery.        2. Pain: the needles have to go through skin and soft tissues, will cause soreness.       3. Damage to internal structures:  The nerves to be lesioned may be near blood vessels or    other nerves which can be potentially damaged.       4. Bleeding: Bleeding is more common if the patient is taking blood thinners such as  aspirin, Coumadin, Ticiid, Plavix, etc., or if he/she have some genetic predisposition  such as hemophilia. Bleeding into the spinal canal can cause compression of the spinal  cord with subsequent paralysis.  This would require an emergency surgery to  decompress and there are no guarantees that the patient would recover from the  paralysis.       5. Pneumothorax:  Puncturing of a lung is a possibility, every time a needle is introduced in  the area of the chest or upper back.  Pneumothorax refers to free air around the  collapsed lung(s), inside of the thoracic cavity (chest cavity).  Another two possible  complications related to a similar event would include: Hemothorax and Chylothorax.   These are variations of the Pneumothorax, where instead of air around the collapsed  lung(s), you may have blood or chyle, respectively.       6. Spinal headaches: They may occur with any procedures in the area of the spine.       7. Persistent CSF (Cerebro-Spinal Fluid) leakage: This is a rare problem, but may occur  with prolonged intrathecal or epidural catheters either due to the formation of a fistulous  track or a dural tear.       8. Nerve damage: By working so close to the spinal cord, there is always a possibility of  nerve damage, which could be as serious as a permanent spinal cord injury with  paralysis.       9. Death:  Although rare, severe deadly allergic reactions known as "Anaphylactic  reaction" can  occur to any of the medications used.      10. Worsening of the symptoms:  We can always make thing worse.  What are the chances of something like this happening? Chances of any of this occuring are extremely low.  By statistics, you have more of a chance of getting killed in a motor vehicle accident: while driving to the hospital than any of the above occurring .  Nevertheless, you should be aware that they are possibilities.  In general, it is similar to taking a shower.  Everybody knows that you can slip, hit your head and get killed.  Does that mean that you should not shower again?  Nevertheless always keep in mind  that statistics do not mean anything if you happen to be on the wrong side of them.  Even if a procedure has a 1 (one) in a 1,000,000 (million) chance of going wrong, it you happen to be that one..Also, keep in mind that by statistics, you have more of a chance of having something go wrong when taking medications.  Who should not have this procedure? If you are on a blood thinning medication (e.g. Coumadin, Plavix, see list of "Blood Thinners"), or if you have an active infection going on, you should not have the procedure.  If you are taking any blood thinners, please inform your physician.  How should I prepare for this procedure?  Do not eat or drink anything at least six hours prior to the procedure.  Bring a driver with you .  It cannot be a taxi.  Come accompanied by an adult that can drive you back, and that is strong enough to help you if your legs get weak or numb from the local anesthetic.  Take all of your medicines the morning of the procedure with just enough water to swallow them.  If you have diabetes, make sure that you are scheduled to have your procedure done first thing in the morning, whenever possible.  If you have diabetes, take only half of your insulin dose and notify our nurse that you have done so as soon as you arrive at the clinic.  If you are  diabetic, but only take blood sugar pills (oral hypoglycemic), then do not take them on the morning of your procedure.  You may take them after you have had the procedure.  Do not take aspirin or any aspirin-containing medications, at least eleven (11) days prior to the procedure.  They may prolong bleeding.  Wear loose fitting clothing that may be easy to take off and that you would not mind if it got stained with Betadine or blood.  Do not wear any jewelry or perfume  Remove any nail coloring.  It will interfere with some of our monitoring equipment.  NOTE: Remember that this is not meant to be interpreted as a complete list of all possible complications.  Unforeseen problems may occur.  BLOOD THINNERS The following drugs contain aspirin or other products, which can cause increased bleeding during surgery and should not be taken for 2 weeks prior to and 1 week after surgery.  If you should need take something for relief of minor pain, you may take acetaminophen which is found in Tylenol,m Datril, Anacin-3 and Panadol. It is not blood thinner. The products listed below are.  Do not take any of the products listed below in addition to any listed on your instruction sheet.  A.P.C or A.P.C with Codeine Codeine Phosphate Capsules #3 Ibuprofen Ridaura  ABC compound Congesprin Imuran rimadil  Advil Cope Indocin Robaxisal  Alka-Seltzer Effervescent Pain Reliever and Antacid Coricidin or Coricidin-D  Indomethacin Rufen  Alka-Seltzer plus Cold Medicine Cosprin Ketoprofen S-A-C Tablets  Anacin Analgesic Tablets or Capsules Coumadin Korlgesic Salflex  Anacin Extra Strength Analgesic tablets or capsules CP-2 Tablets Lanoril Salicylate  Anaprox Cuprimine Capsules Levenox Salocol  Anexsia-D Dalteparin Magan Salsalate  Anodynos Darvon compound Magnesium Salicylate Sine-off  Ansaid Dasin Capsules Magsal Sodium Salicylate  Anturane Depen Capsules Marnal Soma  APF Arthritis pain formula Dewitt's Pills  Measurin Stanback  Argesic Dia-Gesic Meclofenamic Sulfinpyrazone  Arthritis Bayer Timed Release Aspirin Diclofenac Meclomen Sulindac  Arthritis pain formula Anacin Dicumarol Medipren Supac  Analgesic (Safety coated) Arthralgen  Diffunasal Mefanamic Suprofen  Arthritis Strength Bufferin Dihydrocodeine Mepro Compound Suprol  Arthropan liquid Dopirydamole Methcarbomol with Aspirin Synalgos  ASA tablets/Enseals Disalcid Micrainin Tagament  Ascriptin Doan's Midol Talwin  Ascriptin A/D Dolene Mobidin Tanderil  Ascriptin Extra Strength Dolobid Moblgesic Ticlid  Ascriptin with Codeine Doloprin or Doloprin with Codeine Momentum Tolectin  Asperbuf Duoprin Mono-gesic Trendar  Aspergum Duradyne Motrin or Motrin IB Triminicin  Aspirin plain, buffered or enteric coated Durasal Myochrisine Trigesic  Aspirin Suppositories Easprin Nalfon Trillsate  Aspirin with Codeine Ecotrin Regular or Extra Strength Naprosyn Uracel  Atromid-S Efficin Naproxen Ursinus  Auranofin Capsules Elmiron Neocylate Vanquish  Axotal Emagrin Norgesic Verin  Azathioprine Empirin or Empirin with Codeine Normiflo Vitamin E  Azolid Emprazil Nuprin Voltaren  Bayer Aspirin plain, buffered or children's or timed BC Tablets or powders Encaprin Orgaran Warfarin Sodium  Buff-a-Comp Enoxaparin Orudis Zorpin  Buff-a-Comp with Codeine Equegesic Os-Cal-Gesic   Buffaprin Excedrin plain, buffered or Extra Strength Oxalid   Bufferin Arthritis Strength Feldene Oxphenbutazone   Bufferin plain or Extra Strength Feldene Capsules Oxycodone with Aspirin   Bufferin with Codeine Fenoprofen Fenoprofen Pabalate or Pabalate-SF   Buffets II Flogesic Panagesic   Buffinol plain or Extra Strength Florinal or Florinal with Codeine Panwarfarin   Buf-Tabs Flurbiprofen Penicillamine   Butalbital Compound Four-way cold tablets Penicillin   Butazolidin Fragmin Pepto-Bismol   Carbenicillin Geminisyn Percodan   Carna Arthritis Reliever Geopen Persantine    Carprofen Gold's salt Persistin   Chloramphenicol Goody's Phenylbutazone   Chloromycetin Haltrain Piroxlcam   Clmetidine heparin Plaquenil   Cllnoril Hyco-pap Ponstel   Clofibrate Hydroxy chloroquine Propoxyphen         Before stopping any of these medications, be sure to consult the physician who ordered them.  Some, such as Coumadin (Warfarin) are ordered to prevent or treat serious conditions such as "deep thrombosis", "pumonary embolisms", and other heart problems.  The amount of time that you may need off of the medication may also vary with the medication and the reason for which you were taking it.  If you are taking any of these medications, please make sure you notify your pain physician before you undergo any procedures.         Epidural Steroid Injection An epidural steroid injection is a shot of steroid medicine and numbing medicine that is given into the space between the spinal cord and the bones in your back (epidural space). The shot helps relieve pain caused by an irritated or swollen nerve root. The amount of pain relief you get from the injection depends on what is causing the nerve to be swollen and irritated, and how long your pain lasts. You are more likely to benefit from this injection if your pain is strong and comes on suddenly rather than if you have had pain for a long time. Tell a health care provider about:  Any allergies you have.  All medicines you are taking, including vitamins, herbs, eye drops, creams, and over-the-counter medicines.  Any problems you or family members have had with anesthetic medicines.  Any blood disorders you have.  Any surgeries you have had.  Any medical conditions you have.  Whether you are pregnant or may be pregnant. What are the risks? Generally, this is a safe procedure. However, problems may occur, including:  Headache.  Bleeding.  Infection.  Allergic reaction to medicines.  Damage to your nerves. What  happens before the procedure? Staying hydrated  Follow instructions from your health care provider about hydration, which may include:  Up to 2 hours before the procedure - you may continue to drink clear liquids, such as water, clear fruit juice, black coffee, and plain tea. Eating and drinking restrictions  Follow instructions from your health care provider about eating and drinking, which may include:  8 hours before the procedure - stop eating heavy meals or foods such as meat, fried foods, or fatty foods.  6 hours before the procedure - stop eating light meals or foods, such as toast or cereal.  6 hours before the procedure - stop drinking milk or drinks that contain milk.  2 hours before the procedure - stop drinking clear liquids. Medicine  You may be given medicines to lower anxiety.  Ask your health care provider about:  Changing or stopping your regular medicines. This is especially important if you are taking diabetes medicines or blood thinners.  Taking medicines such as aspirin and ibuprofen. These medicines can thin your blood. Do not take these medicines before your procedure if your health care provider instructs you not to. General instructions  Plan to have someone take you home from the hospital or clinic. What happens during the procedure?  You may receive a medicine to help you relax (sedative).  You will be asked to lie on your abdomen.  The injection site will be cleaned.  A numbing medicine (local anesthetic) will be used to numb the injection site.  A needle will be inserted through your skin into the epidural space. You may feel some discomfort when this happens. An X-ray machine will be used to make sure the needle is put as close as possible to the affected nerve.  A steroid medicine and a local anesthetic will be injected into the epidural space.  The needle will be removed.  A bandage (dressing) will be put over the injection site. What  happens after the procedure?  Your blood pressure, heart rate, breathing rate, and blood oxygen level will be monitored until the medicines you were given have worn off.  Your arm or leg may feel weak or numb for a few hours.  The injection site may feel sore.  Do not drive for 24 hours if you received a sedative. This information is not intended to replace advice given to you by your health care provider. Make sure you discuss any questions you have with your health care provider. Document Released: 12/28/2007 Document Revised: 03/03/2016 Document Reviewed: 01/06/2016 Elsevier Interactive Patient Education  2017 [employer].

## 2016-09-09 NOTE — Progress Notes (Signed)
Nursing Pain Medication Assessment:  Safety precautions to be maintained throughout the outpatient stay will include: orient to surroundings, keep bed in low position, maintain call bell within reach at all times, provide assistance with transfer out of bed and ambulation.  Medication Inspection Compliance: [name] did not comply with our request to bring his pills to be counted. He was reminded that bringing the medication bottles, even when empty, is a requirement. Pill Count: No pills available to be counted today. Bottle Appearance: No container available. Did not bring bottle(s) to appointment. Medication: None brought in. Filled Date: N/A Medication last intake: 09/09/16

## 2016-09-09 NOTE — Patient Instructions (Signed)
GENERAL RISKS AND COMPLICATIONS  What are the risk, side effects and possible complications? Generally speaking, most procedures are safe.  However, with any procedure there are risks, side effects, and the possibility of complications.  The risks and complications are dependent upon the sites that are lesioned, or the type of nerve block to be performed.  The closer the procedure is to the spine, the more serious the risks are.  Great care is taken when placing the radio frequency needles, block needles or lesioning probes, but sometimes complications can occur. 1. Infection: Any time there is an injection through the skin, there is a risk of infection.  This is why sterile conditions are used for these blocks.  There are four possible types of infection. 1. Localized skin infection. 2. Central Nervous System Infection-This can be in the form of Meningitis, which can be deadly. 3. Epidural Infections-This can be in the form of an epidural abscess, which can cause pressure inside of the spine, causing compression of the spinal cord with subsequent paralysis. This would require an emergency surgery to decompress, and there are no guarantees that the patient would recover from the paralysis. 4. Discitis-This is an infection of the intervertebral discs.  It occurs in about 1% of discography procedures.  It is difficult to treat and it may lead to surgery.        2. Pain: the needles have to go through skin and soft tissues, will cause soreness.       3. Damage to internal structures:  The nerves to be lesioned may be near blood vessels or    other nerves which can be potentially damaged.       4. Bleeding: Bleeding is more common if the patient is taking blood thinners such as  aspirin, Coumadin, Ticiid, Plavix, etc., or if he/she have some genetic predisposition  such as hemophilia. Bleeding into the spinal canal can cause compression of the spinal  cord with subsequent paralysis.  This would require an  emergency surgery to  decompress and there are no guarantees that the patient would recover from the  paralysis.       5. Pneumothorax:  Puncturing of a lung is a possibility, every time a needle is introduced in  the area of the chest or upper back.  Pneumothorax refers to free air around the  collapsed lung(s), inside of the thoracic cavity (chest cavity).  Another two possible  complications related to a similar event would include: Hemothorax and Chylothorax.   These are variations of the Pneumothorax, where instead of air around the collapsed  lung(s), you may have blood or chyle, respectively.       6. Spinal headaches: They may occur with any procedures in the area of the spine.       7. Persistent CSF (Cerebro-Spinal Fluid) leakage: This is a rare problem, but may occur  with prolonged intrathecal or epidural catheters either due to the formation of a fistulous  track or a dural tear.       8. Nerve damage: By working so close to the spinal cord, there is always a possibility of  nerve damage, which could be as serious as a permanent spinal cord injury with  paralysis.       9. Death:  Although rare, severe deadly allergic reactions known as "Anaphylactic  reaction" can occur to any of the medications used.      10. Worsening of the symptoms:  We can always make thing worse.    What are the chances of something like this happening? Chances of any of this occuring are extremely low.  By statistics, you have more of a chance of getting killed in a motor vehicle accident: while driving to the hospital than any of the above occurring .  Nevertheless, you should be aware that they are possibilities.  In general, it is similar to taking a shower.  Everybody knows that you can slip, hit your head and get killed.  Does that mean that you should not shower again?  Nevertheless always keep in mind that statistics do not mean anything if you happen to be on the wrong side of them.  Even if a procedure has a 1  (one) in a 1,000,000 (million) chance of going wrong, it you happen to be that one..Also, keep in mind that by statistics, you have more of a chance of having something go wrong when taking medications.  Who should not have this procedure? If you are on a blood thinning medication (e.g. Coumadin, Plavix, see list of "Blood Thinners"), or if you have an active infection going on, you should not have the procedure.  If you are taking any blood thinners, please inform your physician.  How should I prepare for this procedure?  Do not eat or drink anything at least six hours prior to the procedure.  Bring a driver with you .  It cannot be a taxi.  Come accompanied by an adult that can drive you back, and that is strong enough to help you if your legs get weak or numb from the local anesthetic.  Take all of your medicines the morning of the procedure with just enough water to swallow them.  If you have diabetes, make sure that you are scheduled to have your procedure done first thing in the morning, whenever possible.  If you have diabetes, take only half of your insulin dose and notify our nurse that you have done so as soon as you arrive at the clinic.  If you are diabetic, but only take blood sugar pills (oral hypoglycemic), then do not take them on the morning of your procedure.  You may take them after you have had the procedure.  Do not take aspirin or any aspirin-containing medications, at least eleven (11) days prior to the procedure.  They may prolong bleeding.  Wear loose fitting clothing that may be easy to take off and that you would not mind if it got stained with Betadine or blood.  Do not wear any jewelry or perfume  Remove any nail coloring.  It will interfere with some of our monitoring equipment.  NOTE: Remember that this is not meant to be interpreted as a complete list of all possible complications.  Unforeseen problems may occur.  BLOOD THINNERS The following drugs  contain aspirin or other products, which can cause increased bleeding during surgery and should not be taken for 2 weeks prior to and 1 week after surgery.  If you should need take something for relief of minor pain, you may take acetaminophen which is found in Tylenol,m Datril, Anacin-3 and Panadol. It is not blood thinner. The products listed below are.  Do not take any of the products listed below in addition to any listed on your instruction sheet.  A.P.C or A.P.C with Codeine Codeine Phosphate Capsules #3 Ibuprofen Ridaura  ABC compound Congesprin Imuran rimadil  Advil Cope Indocin Robaxisal  Alka-Seltzer Effervescent Pain Reliever and Antacid Coricidin or Coricidin-D  Indomethacin Rufen    Alka-Seltzer plus Cold Medicine Cosprin Ketoprofen S-A-C Tablets  Anacin Analgesic Tablets or Capsules Coumadin Korlgesic Salflex  Anacin Extra Strength Analgesic tablets or capsules CP-2 Tablets Lanoril Salicylate  Anaprox Cuprimine Capsules Levenox Salocol  Anexsia-D Dalteparin Magan Salsalate  Anodynos Darvon compound Magnesium Salicylate Sine-off  Ansaid Dasin Capsules Magsal Sodium Salicylate  Anturane Depen Capsules Marnal Soma  APF Arthritis pain formula Dewitt's Pills Measurin Stanback  Argesic Dia-Gesic Meclofenamic Sulfinpyrazone  Arthritis Bayer Timed Release Aspirin Diclofenac Meclomen Sulindac  Arthritis pain formula Anacin Dicumarol Medipren Supac  Analgesic (Safety coated) Arthralgen Diffunasal Mefanamic Suprofen  Arthritis Strength Bufferin Dihydrocodeine Mepro Compound Suprol  Arthropan liquid Dopirydamole Methcarbomol with Aspirin Synalgos  ASA tablets/Enseals Disalcid Micrainin Tagament  Ascriptin Doan's Midol Talwin  Ascriptin A/D Dolene Mobidin Tanderil  Ascriptin Extra Strength Dolobid Moblgesic Ticlid  Ascriptin with Codeine Doloprin or Doloprin with Codeine Momentum Tolectin  Asperbuf Duoprin Mono-gesic Trendar  Aspergum Duradyne Motrin or Motrin IB Triminicin  Aspirin  plain, buffered or enteric coated Durasal Myochrisine Trigesic  Aspirin Suppositories Easprin Nalfon Trillsate  Aspirin with Codeine Ecotrin Regular or Extra Strength Naprosyn Uracel  Atromid-S Efficin Naproxen Ursinus  Auranofin Capsules Elmiron Neocylate Vanquish  Axotal Emagrin Norgesic Verin  Azathioprine Empirin or Empirin with Codeine Normiflo Vitamin E  Azolid Emprazil Nuprin Voltaren  Bayer Aspirin plain, buffered or children's or timed BC Tablets or powders Encaprin Orgaran Warfarin Sodium  Buff-a-Comp Enoxaparin Orudis Zorpin  Buff-a-Comp with Codeine Equegesic Os-Cal-Gesic   Buffaprin Excedrin plain, buffered or Extra Strength Oxalid   Bufferin Arthritis Strength Feldene Oxphenbutazone   Bufferin plain or Extra Strength Feldene Capsules Oxycodone with Aspirin   Bufferin with Codeine Fenoprofen Fenoprofen Pabalate or Pabalate-SF   Buffets II Flogesic Panagesic   Buffinol plain or Extra Strength Florinal or Florinal with Codeine Panwarfarin   Buf-Tabs Flurbiprofen Penicillamine   Butalbital Compound Four-way cold tablets Penicillin   Butazolidin Fragmin Pepto-Bismol   Carbenicillin Geminisyn Percodan   Carna Arthritis Reliever Geopen Persantine   Carprofen Gold's salt Persistin   Chloramphenicol Goody's Phenylbutazone   Chloromycetin Haltrain Piroxlcam   Clmetidine heparin Plaquenil   Cllnoril Hyco-pap Ponstel   Clofibrate Hydroxy chloroquine Propoxyphen         Before stopping any of these medications, be sure to consult the physician who ordered them.  Some, such as Coumadin (Warfarin) are ordered to prevent or treat serious conditions such as "deep thrombosis", "pumonary embolisms", and other heart problems.  The amount of time that you may need off of the medication may also vary with the medication and the reason for which you were taking it.  If you are taking any of these medications, please make sure you notify your pain physician before you undergo any  procedures.         Epidural Steroid Injection An epidural steroid injection is a shot of steroid medicine and numbing medicine that is given into the space between the spinal cord and the bones in your back (epidural space). The shot helps relieve pain caused by an irritated or swollen nerve root. The amount of pain relief you get from the injection depends on what is causing the nerve to be swollen and irritated, and how long your pain lasts. You are more likely to benefit from this injection if your pain is strong and comes on suddenly rather than if you have had pain for a long time. Tell a health care provider about:  Any allergies you have.  All medicines you are taking, including vitamins, herbs,   eye drops, creams, and over-the-counter medicines.  Any problems you or family members have had with anesthetic medicines.  Any blood disorders you have.  Any surgeries you have had.  Any medical conditions you have.  Whether you are pregnant or may be pregnant. What are the risks? Generally, this is a safe procedure. However, problems may occur, including:  Headache.  Bleeding.  Infection.  Allergic reaction to medicines.  Damage to your nerves.  What happens before the procedure? Staying hydrated Follow instructions from your health care provider about hydration, which may include:  Up to 2 hours before the procedure - you may continue to drink clear liquids, such as water, clear fruit juice, black coffee, and plain tea.  Eating and drinking restrictions Follow instructions from your health care provider about eating and drinking, which may include:  8 hours before the procedure - stop eating heavy meals or foods such as meat, fried foods, or fatty foods.  6 hours before the procedure - stop eating light meals or foods, such as toast or cereal.  6 hours before the procedure - stop drinking milk or drinks that contain milk.  2 hours before the procedure - stop  drinking clear liquids.  Medicine  You may be given medicines to lower anxiety.  Ask your health care provider about: ? Changing or stopping your regular medicines. This is especially important if you are taking diabetes medicines or blood thinners. ? Taking medicines such as aspirin and ibuprofen. These medicines can thin your blood. Do not take these medicines before your procedure if your health care provider instructs you not to. General instructions  Plan to have someone take you home from the hospital or clinic. What happens during the procedure?  You may receive a medicine to help you relax (sedative).  You will be asked to lie on your abdomen.  The injection site will be cleaned.  A numbing medicine (local anesthetic) will be used to numb the injection site.  A needle will be inserted through your skin into the epidural space. You may feel some discomfort when this happens. An X-ray machine will be used to make sure the needle is put as close as possible to the affected nerve.  A steroid medicine and a local anesthetic will be injected into the epidural space.  The needle will be removed.  A bandage (dressing) will be put over the injection site. What happens after the procedure?  Your blood pressure, heart rate, breathing rate, and blood oxygen level will be monitored until the medicines you were given have worn off.  Your arm or leg may feel weak or numb for a few hours.  The injection site may feel sore.  Do not drive for 24 hours if you received a sedative. This information is not intended to replace advice given to you by your health care provider. Make sure you discuss any questions you have with your health care provider. Document Released: 12/28/2007 Document Revised: 03/03/2016 Document Reviewed: 01/06/2016 [employer] Interactive Patient Education  2017 [employer]  

## 2016-09-15 LAB — TOXASSURE SELECT 13 (MW), URINE

## 2016-09-16 ENCOUNTER — Ambulatory Visit
Admission: RE | Admit: 2016-09-16 | Discharge: 2016-09-16 | Disposition: A | Discharge status: Home / Self Care | Payer: BLUE CROSS/BLUE SHIELD | Source: Ambulatory Visit | Attending: Pain Medicine | Admitting: Pain Medicine

## 2016-09-16 DIAGNOSIS — G8929 Other chronic pain: Secondary | ICD-10-CM | POA: Insufficient documentation

## 2016-09-16 DIAGNOSIS — M4317 Spondylolisthesis, lumbosacral region: Secondary | ICD-10-CM | POA: Insufficient documentation

## 2016-09-16 DIAGNOSIS — M5136 Other intervertebral disc degeneration, lumbar region: Secondary | ICD-10-CM | POA: Diagnosis not present

## 2016-09-16 DIAGNOSIS — M5442 Lumbago with sciatica, left side: Secondary | ICD-10-CM | POA: Diagnosis not present

## 2016-09-16 DIAGNOSIS — M5137 Other intervertebral disc degeneration, lumbosacral region: Secondary | ICD-10-CM | POA: Insufficient documentation

## 2016-09-16 DIAGNOSIS — M5416 Radiculopathy, lumbar region: Secondary | ICD-10-CM | POA: Insufficient documentation

## 2016-09-16 NOTE — Progress Notes (Signed)
Results were reviewed and found to be: mildly abnormal  No acute injury or pathology identified  Review would suggest interventional pain management techniques may be of benefit 

## 2016-10-13 ENCOUNTER — Ambulatory Visit: Payer: BLUE CROSS/BLUE SHIELD | Admitting: Pain Medicine

## 2016-10-20 ENCOUNTER — Ambulatory Visit: Payer: BLUE CROSS/BLUE SHIELD | Admitting: Pain Medicine

## 2016-10-27 ENCOUNTER — Ambulatory Visit
Admission: RE | Admit: 2016-10-27 | Discharge: 2016-10-27 | Disposition: A | Discharge status: Home / Self Care | Payer: BLUE CROSS/BLUE SHIELD | Source: Ambulatory Visit | Attending: Pain Medicine | Admitting: Pain Medicine

## 2016-10-27 ENCOUNTER — Ambulatory Visit (HOSPITAL_BASED_OUTPATIENT_CLINIC_OR_DEPARTMENT_OTHER): Payer: BLUE CROSS/BLUE SHIELD | Admitting: Pain Medicine

## 2016-10-27 ENCOUNTER — Encounter: Payer: Self-pay | Admitting: Pain Medicine

## 2016-10-27 VITALS — BP 151/85 | HR 62 | Temp 98.3°F | Resp 13 | Ht 72.0 in | Wt 185.0 lb

## 2016-10-27 DIAGNOSIS — M5416 Radiculopathy, lumbar region: Secondary | ICD-10-CM

## 2016-10-27 DIAGNOSIS — M48061 Spinal stenosis, lumbar region without neurogenic claudication: Secondary | ICD-10-CM

## 2016-10-27 DIAGNOSIS — M5136 Other intervertebral disc degeneration, lumbar region: Secondary | ICD-10-CM

## 2016-10-27 DIAGNOSIS — G8929 Other chronic pain: Secondary | ICD-10-CM | POA: Insufficient documentation

## 2016-10-27 DIAGNOSIS — M5126 Other intervertebral disc displacement, lumbar region: Secondary | ICD-10-CM

## 2016-10-27 DIAGNOSIS — M9983 Other biomechanical lesions of lumbar region: Secondary | ICD-10-CM | POA: Diagnosis not present

## 2016-10-27 MED ORDER — DEXAMETHASONE SODIUM PHOSPHATE 4 MG/ML IJ SOLN
10.0000 mg | Freq: Once | INTRAMUSCULAR | Status: AC
Start: 2016-10-27 — End: 2016-10-27
  Administered 2016-10-27: 10 mg
  Filled 2016-10-27: qty 3

## 2016-10-27 MED ORDER — MIDAZOLAM HCL 5 MG/5ML IJ SOLN: 1.0000 mg | INTRAMUSCULAR | Status: DC | PRN

## 2016-10-27 MED ORDER — SODIUM CHLORIDE 0.9 % IJ SOLN
INTRAMUSCULAR | Status: AC
  Administered 2016-10-27: 09:00:00
  Filled 2016-10-27: qty 20

## 2016-10-27 MED ORDER — TRIAMCINOLONE ACETONIDE 40 MG/ML IJ SUSP
INTRAMUSCULAR | Status: AC
  Administered 2016-10-27: 09:00:00
  Filled 2016-10-27: qty 1

## 2016-10-27 MED ORDER — ROPIVACAINE HCL 2 MG/ML IJ SOLN
2.0000 mL | Freq: Once | INTRAMUSCULAR | Status: AC
  Administered 2016-10-27: 2 mL via EPIDURAL
  Filled 2016-10-27: qty 10

## 2016-10-27 MED ORDER — SODIUM CHLORIDE 0.9% FLUSH
2.0000 mL | Freq: Once | INTRAVENOUS | Status: AC
  Administered 2016-10-27: 2 mL

## 2016-10-27 MED ORDER — TRIAMCINOLONE ACETONIDE 40 MG/ML IJ SUSP
40.0000 mg | Freq: Once | INTRAMUSCULAR | Status: AC
  Administered 2016-10-27: 40 mg

## 2016-10-27 MED ORDER — LACTATED RINGERS IV SOLN: 1000.0000 mL | Freq: Once | INTRAVENOUS | Status: DC

## 2016-10-27 MED ORDER — LIDOCAINE HCL (PF) 1 % IJ SOLN
10.0000 mL | Freq: Once | INTRAMUSCULAR | Status: AC
  Administered 2016-10-27: 10 mL
  Filled 2016-10-27: qty 10

## 2016-10-27 MED ORDER — ROPIVACAINE HCL 2 MG/ML IJ SOLN
1.0000 mL | Freq: Once | INTRAMUSCULAR | Status: AC
  Administered 2016-10-27: 1 mL via EPIDURAL
  Filled 2016-10-27: qty 10

## 2016-10-27 MED ORDER — IOPAMIDOL (ISOVUE-M 200) INJECTION 41%
10.0000 mL | Freq: Once | INTRAMUSCULAR | Status: AC
  Administered 2016-10-27: 10 mL via EPIDURAL
  Filled 2016-10-27: qty 10

## 2016-10-27 MED ORDER — FENTANYL CITRATE (PF) 100 MCG/2ML IJ SOLN: 25.0000 ug | INTRAMUSCULAR | Status: DC | PRN

## 2016-10-27 MED ORDER — SODIUM CHLORIDE 0.9% FLUSH
1.0000 mL | Freq: Once | INTRAVENOUS | Status: AC
  Administered 2016-10-27: 1 mL

## 2016-10-27 NOTE — Progress Notes (Signed)
Patient's Name: Caleb Edwards  MRN: [id number]  Referring Provider: [facility]  DOB: 1951-05-21  PCP: [employer]  DOS: 10/27/2016  Note by: Sydnee Levans. Laban Emperor, MD  Service setting: Ambulatory outpatient  Location: [facility] (AMB) Pain Management Facility  Visit type: Procedure  Specialty: Interventional Pain Management  Patient type: Established   Primary Reason for Visit: Interventional Pain Management Treatment. CC: Back Pain (lower)  Procedure:  Anesthesia, Analgesia, Anxiolysis:  Procedure #1: Type: Diagnostic Inter-Laminar Epidural Steroid Injection Region: Lumbar Level: L3-4 Level. Laterality: Left-Sided Paramedial  Procedure #2: Type: Diagnostic Trans-Foraminal Epidural Steroid Injection Region: Lumbar Level: L4-5 Paravertebral Laterality: Left-Sided Paravertebral Position: Prone  Type: Local Anesthesia Local Anesthetic: Lidocaine 1% Route: Infiltration ([place]/IM) IV Access: Declined Sedation: Declined  Indication(s): Analgesia          Indications: 1. 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 L4-5 disc bulge   3. 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 (year old), male patient, seen today for interventional treatment. He  has a past surgical history that includes Esophagogastroduodenoscopy (egd) with propofol (N/A, 01/03/2016). His primarily concern today is the Back Pain (lower)  Initial Vital Signs: Blood pressure (!) 150/75, pulse 60, temperature 98.3 F (36.8 C), temperature source Oral, resp. rate 16, height 6' (1.829 m), weight 185 lb (83.9 kg), SpO2 100 %. BMI: 25.09 kg/m  Risk Assessment: Allergies: Reviewed. He has No Known Allergies. Allergy Precautions: None required Coagulopathies: "Reviewed. None identified.  Blood-thinner  therapy: None at this time Active Infection(s): Reviewed. None identified. Caleb Edwards is afebrile  Site Confirmation: Caleb Edwards was asked to confirm the procedure and laterality before marking the site Procedure checklist: Completed Consent: Before the procedure and under the influence of no sedative(s), amnesic(s), or anxiolytics, the patient was informed of the treatment options, risks and possible complications. To fulfill our ethical and legal obligations, as recommended by the American Medical Association's Code of Ethics, I have informed the patient of my clinical impression; the nature and purpose of the treatment or procedure; the risks, benefits, and possible complications of the intervention; the alternatives, including doing nothing; the risk(s) and benefit(s) of the alternative treatment(s) or procedure(s); and the risk(s) and benefit(s) of doing nothing. The patient was provided information about the general risks and possible complications associated with the procedure. These may include, but are not limited to: failure to achieve desired goals, infection, bleeding, organ or nerve damage, allergic reactions, paralysis, and death. In addition, the patient was informed of those risks and complications associated to Spine-related procedures, such as failure to decrease pain; infection (i.e.: Meningitis, epidural or intraspinal abscess); bleeding (i.e.: epidural hematoma, subarachnoid hemorrhage, or any other type of intraspinal or peri-dural bleeding); organ or nerve damage (i.e.: Any type of peripheral nerve, nerve root, or spinal cord injury) with subsequent damage to sensory, motor, and/or autonomic systems, resulting in permanent pain, numbness, and/or weakness of one or several areas of the body; allergic reactions; (i.e.: anaphylactic reaction); and/or death. Furthermore, the patient was informed of those risks and complications associated with the medications. These include, but are not  limited to: allergic reactions (i.e.: anaphylactic or anaphylactoid reaction(s)); adrenal axis suppression; blood sugar elevation that in diabetics may result in ketoacidosis or comma; water retention that in patients with history of congestive heart failure may  result in shortness of breath, pulmonary edema, and decompensation with resultant heart failure; weight gain; swelling or edema; medication-induced neural toxicity; particulate matter embolism and blood vessel occlusion with resultant organ, and/or nervous system infarction; and/or aseptic necrosis of one or more joints. Finally, the patient was informed that Medicine is not an exact science; therefore, there is also the possibility of unforeseen or unpredictable risks and/or possible complications that may result in a catastrophic outcome. The patient indicated having understood very clearly. We have given the patient no guarantees and we have made no promises. Enough time was given to the patient to ask questions, all of which were answered to the patient's satisfaction. Caleb Edwards has indicated that he wanted to continue with the procedure. Attestation: I, the ordering provider, attest that I have discussed with the patient the benefits, risks, side-effects, alternatives, likelihood of achieving goals, and potential problems during recovery for the procedure that I have provided informed consent. Date: 10/27/2016; Time: 8:45 AM  Pre-Procedure Preparation:  Monitoring: As per clinic protocol. Respiration, ETCO2, SpO2, BP, heart rate and rhythm monitor placed and checked for adequate function Safety Precautions: Patient was assessed for positional comfort and pressure points before starting the procedure. Time-out: I initiated and conducted the "Time-out" before starting the procedure, as per protocol. The patient was asked to participate by confirming the accuracy of the "Time Out" information. Verification of the correct person, site, and procedure  were performed and confirmed by me, the nursing staff, and the patient. "Time-out" conducted as per Joint Commission's Universal Protocol (UP.01.01.01). "Time-out" Date & Time: 10/27/2016; 0916 hrs.  Description of Procedure #1 Process:   Target Area: The  interlaminar space, initially targeting the lower border of the superior vertebral body lamina. Approach: Posterior paramedial approach. Area Prepped: Entire Posterior Lumbosacral Region Prepping solution: ChloraPrep (2% chlorhexidine gluconate and 70% isopropyl alcohol) Safety Precautions: Aspiration looking for blood return was conducted prior to all injections. At no point did we inject any substances, as a needle was being advanced. No attempts were made at seeking any paresthesias. Safe injection practices and needle disposal techniques used. Medications properly checked for expiration dates. SDV (single dose vial) medications used. Description of the Procedure: Protocol guidelines were followed. The patient was placed in position over the fluoroscopy table. The target area was identified and the area prepped in the usual manner. Skin desensitized using vapocoolant spray. Skin & deeper tissues infiltrated with local anesthetic. Appropriate amount of time allowed to pass for local anesthetics to take effect. The procedure needle was introduced through the skin, ipsilateral to the reported pain, and advanced to the target area. Bone was contacted and the needle walked caudad, until the lamina was cleared. The ligamentum flavum was engaged and loss-of-resistance technique used as the epidural needle was advanced. The epidural space was identified using "loss-of-resistance technique" with 2-3 ml of PF-NaCl (0.9% NSS), in a 5cc LOR glass syringe. Proper needle placement secured. Negative aspiration confirmed. Solution injected in intermittent fashion, asking for systemic symptoms every 0.5cc of injectate. The needles were then removed and the area  cleansed, making sure to leave some of the prepping solution back to take advantage of its long term bactericidal properties. Start Time: 0923 hrs.  Description of Procedure #2 Process:   Target Area: The inferior and lateral portion of the pedicle, just lateral to a line created by the 6:00 position of the pedicle and the superior articular process of the vertebral body below. On the lateral view, this target lies just  posterior to the anterior aspect of the lamina and posterior to the midpoint created between the anterior and the posterior aspect of the neural foramina. Approach: Posterior paravertebral approach. Area Prepped: Same as above Prepping solution: Same as above Safety Precautions: Same as above Description of the Procedure: Protocol guidelines were followed. The patient was placed in position over the fluoroscopy table. The target area was identified and the area prepped in the usual manner. Skin desensitized using vapocoolant spray. Skin & deeper tissues infiltrated with local anesthetic. Appropriate amount of time allowed to pass for local anesthetics to take effect. The procedure needles were then advanced to the target area. Proper needle placement secured. Negative aspiration confirmed. Solution injected in intermittent fashion, asking for systemic symptoms every 0.2cc of injectate. The needles were then removed and the area cleansed, making sure to leave some of the prepping solution back to take advantage of its long term bactericidal properties. End Time: 0932 hrs. Materials:  Needle(s) Type: Spinal needle for the transforaminal and epidural needle for the translaminar Gauge: 25-gauge for the transforaminal and 20-gauge for the translaminar Length: 3.5-in Medication(s): We administered dexamethasone, iopamidol, lidocaine (PF), sodium chloride flush, ropivacaine (PF) 2 mg/mL (0.2%), triamcinolone acetonide, sodium chloride flush, ropivacaine (PF) 2 mg/mL (0.2%), sodium chloride,  and triamcinolone acetonide. Please see chart orders for dosing details.  Imaging Guidance (Spinal):  Type of Imaging Technique: Fluoroscopy Guidance (Spinal) Indication(s): Assistance in needle guidance and placement for procedures requiring needle placement in or near specific anatomical locations not easily accessible without such assistance. Exposure Time: Please see nurses notes. Contrast: Before injecting any contrast, we confirmed that the patient did not have an allergy to iodine, shellfish, or radiological contrast. Once satisfactory needle placement was completed at the desired level, radiological contrast was injected. Contrast injected under live fluoroscopy. No contrast complications. See chart for type and volume of contrast used. Fluoroscopic Guidance: I was personally present during the use of fluoroscopy. "Tunnel Vision Technique" used to obtain the best possible view of the target area. Parallax error corrected before commencing the procedure. "Direction-depth-direction" technique used to introduce the needle under continuous pulsed fluoroscopy. Once target was reached, antero-posterior, oblique, and lateral fluoroscopic projection used confirm needle placement in all planes. Images permanently stored in EMR. Interpretation: I personally interpreted the imaging intraoperatively. Adequate needle placement confirmed in multiple planes. Appropriate spread of contrast into desired area was observed. No evidence of afferent or efferent intravascular uptake. No intrathecal or subarachnoid spread observed. Permanent images saved into the patient's record.  Antibiotic Prophylaxis:  Indication(s): None identified Antibiotic given: None  Post-operative Assessment:  EBL: None Complications: No immediate post-treatment complications observed by team, or reported by patient. Note: The patient tolerated the entire procedure well. A repeat set of vitals were taken after the procedure and the  patient was kept under observation following institutional policy, for this type of procedure. Post-procedural neurological assessment was performed, showing return to baseline, prior to discharge. The patient was provided with post-procedure discharge instructions, including a section on how to identify potential problems. Should any problems arise concerning this procedure, the patient was given instructions to immediately contact us, at any time, without hesitation. In any case, we plan to contact the patient by telephone for a follow-up status report regarding this interventional procedure. Comments:  No additional relevant information.  Plan of Care  Disposition: Discharge home  Discharge Date & Time: 10/27/2016;   hrs.  Physician-requested Follow-up:  Return in about 2 weeks (around 11/10/2016) for Post-Procedure evaluation.  Future Appointments Date Time Provider Department Center  11/23/2016 7:45 AM Delano Metz, MD [facility] None  03/10/2017 8:15 AM Delano Metz, MD [facility] None   Medications ordered for procedure: Meds ordered this encounter  Medications  . dexamethasone (DECADRON) injection 10 mg  . iopamidol (ISOVUE-M) 41 % intrathecal injection 10 mL  . lidocaine (PF) (XYLOCAINE) 1 % injection 10 mL  . sodium chloride flush (NS) 0.9 % injection 1 mL  . ropivacaine (PF) 2 mg/mL (0.2%) (NAROPIN) injection 1 mL  . triamcinolone acetonide (KENALOG-40) injection 40 mg  . sodium chloride flush (NS) 0.9 % injection 2 mL  . ropivacaine (PF) 2 mg/mL (0.2%) (NAROPIN) injection 2 mL   Medications administered: We administered dexamethasone, iopamidol, lidocaine (PF), sodium chloride flush, ropivacaine (PF) 2 mg/mL (0.2%), triamcinolone acetonide, sodium chloride flush, ropivacaine (PF) 2 mg/mL (0.2%), sodium chloride, and triamcinolone acetonide.  See the medical record for exact dosing, route, and time of administration.  Lab-work, Procedure(s), & Referral(s) Ordered: Orders  Placed This Encounter  Procedures  . Lumbar Transforaminal Epidural  . Lumbar Epidural Injection  . DG C-Arm 1-60 Min-No Report  . Discharge instructions  . Follow-up  . Informed Consent Details: Transcribe to consent form and obtain patient signature  . Provider attestation of informed consent for procedure/surgical case  . Verify informed consent   Imaging Ordered: No results found for this or any previous visit. New Prescriptions   No medications on file   Primary Care Physician: [facility] Location: [facility] Note by: Sydnee Levans. Laban Emperor, M.D, DABA, DABAPM, DABPM, DABIPP, FIPP Date: 10/27/2016; Time: 1:04 PM  Disclaimer:  Medicine is not an Visual merchandiser. The only guarantee in medicine is that nothing is guaranteed. It is important to note that the decision to proceed with this intervention was based on the information collected from the patient. The Data and conclusions were drawn from the patient's questionnaire, the interview, and the physical examination. Because the information was provided in large part by the patient, it cannot be guaranteed that it has not been purposely or unconsciously manipulated. Every effort has been made to obtain as much relevant data as possible for this evaluation. It is important to note that the conclusions that lead to this procedure are derived in large part from the available data. Always take into account that the treatment will also be dependent on availability of resources and existing treatment guidelines, considered by other Pain Management Practitioners as being common knowledge and practice, at the time of the intervention. For Medico-Legal purposes, it is also important to point out that variation in procedural techniques and pharmacological choices are the acceptable norm. The indications, contraindications, technique, and results of the above procedure should only be interpreted and judged by a  Board-Certified Interventional Pain Specialist with extensive familiarity and expertise in the same exact procedure and technique. Attempts at providing opinions without similar or greater experience and expertise than that of the treating physician will be considered as inappropriate and unethical, and shall result in a formal complaint to the state medical board and applicable specialty societies.  Instructions provided at this appointment: Patient Instructions  Pain Management Discharge Instructions  General Discharge Instructions :  If you need to reach your doctor call: Monday-Friday 8:00 am - 4:00 pm at [phone] or toll free [phone].  After clinic hours [phone] to have operator reach doctor.  Bring all of your medication bottles to all your appointments in the pain clinic.  To cancel or  reschedule your appointment with Pain Management please remember to call 24 hours in advance to avoid a fee.  Refer to the educational materials which you have been given on: General Risks, I had my Procedure. Discharge Instructions, Post Sedation.  Post Procedure Instructions:  The drugs you were given will stay in your system until tomorrow, so for the next 24 hours you should not drive, make any legal decisions or drink any alcoholic beverages.  You may eat anything you prefer, but it is better to start with liquids then soups and crackers, and gradually work up to solid foods.  Please notify your doctor immediately if you have any unusual bleeding, trouble breathing or pain that is not related to your normal pain.  Depending on the type of procedure that was done, some parts of your body may feel week and/or numb.  This usually clears up by tonight or the next day.  Walk with the use of an assistive device or accompanied by an adult for the 24 hours.  You may use ice on the affected area for the first 24 hours.  Put ice in a Ziploc bag and cover with a towel and place against area  15 minutes on 15 minutes off.  You may switch to heat after 24 hours.Epidural Steroid Injection An epidural steroid injection is a shot of steroid medicine and numbing medicine that is given into the space between the spinal cord and the bones in your back (epidural space). The shot helps relieve pain caused by an irritated or swollen nerve root. The amount of pain relief you get from the injection depends on what is causing the nerve to be swollen and irritated, and how long your pain lasts. You are more likely to benefit from this injection if your pain is strong and comes on suddenly rather than if you have had pain for a long time. Tell a health care provider about:  Any allergies you have.  All medicines you are taking, including vitamins, herbs, eye drops, creams, and over-the-counter medicines.  Any problems you or family members have had with anesthetic medicines.  Any blood disorders you have.  Any surgeries you have had.  Any medical conditions you have.  Whether you are pregnant or may be pregnant. What are the risks? Generally, this is a safe procedure. However, problems may occur, including:  Headache.  Bleeding.  Infection.  Allergic reaction to medicines.  Damage to your nerves. What happens before the procedure? Staying hydrated  Follow instructions from your health care provider about hydration, which may include:  Up to 2 hours before the procedure - you may continue to drink clear liquids, such as water, clear fruit juice, black coffee, and plain tea. Eating and drinking restrictions  Follow instructions from your health care provider about eating and drinking, which may include:  8 hours before the procedure - stop eating heavy meals or foods such as meat, fried foods, or fatty foods.  6 hours before the procedure - stop eating light meals or foods, such as toast or cereal.  6 hours before the procedure - stop drinking milk or drinks that contain  milk.  2 hours before the procedure - stop drinking clear liquids. Medicine  You may be given medicines to lower anxiety.  Ask your health care provider about:  Changing or stopping your regular medicines. This is especially important if you are taking diabetes medicines or blood thinners.  Taking medicines such as aspirin and ibuprofen. These medicines can  thin your blood. Do not take these medicines before your procedure if your health care provider instructs you not to. General instructions  Plan to have someone take you home from the hospital or clinic. What happens during the procedure?  You may receive a medicine to help you relax (sedative).  You will be asked to lie on your abdomen.  The injection site will be cleaned.  A numbing medicine (local anesthetic) will be used to numb the injection site.  A needle will be inserted through your skin into the epidural space. You may feel some discomfort when this happens. An X-ray machine will be used to make sure the needle is put as close as possible to the affected nerve.  A steroid medicine and a local anesthetic will be injected into the epidural space.  The needle will be removed.  A bandage (dressing) will be put over the injection site. What happens after the procedure?  Your blood pressure, heart rate, breathing rate, and blood oxygen level will be monitored until the medicines you were given have worn off.  Your arm or leg may feel weak or numb for a few hours.  The injection site may feel sore.  Do not drive for 24 hours if you received a sedative. This information is not intended to replace advice given to you by your health care provider. Make sure you discuss any questions you have with your health care provider. Document Released: 12/28/2007 Document Revised: 03/03/2016 Document Reviewed: 01/06/2016 Elsevier Interactive Patient Education  2017 [employer].

## 2016-10-27 NOTE — Patient Instructions (Signed)
Pain Management Discharge Instructions  General Discharge Instructions :  If you need to reach your doctor call: Monday-Friday 8:00 am - 4:00 pm at [phone] or toll free [phone].  After clinic hours [phone] to have operator reach doctor.  Bring all of your medication bottles to all your appointments in the pain clinic.  To cancel or reschedule your appointment with Pain Management please remember to call 24 hours in advance to avoid a fee.  Refer to the educational materials which you have been given on: General Risks, I had my Procedure. Discharge Instructions, Post Sedation.  Post Procedure Instructions:  The drugs you were given will stay in your system until tomorrow, so for the next 24 hours you should not drive, make any legal decisions or drink any alcoholic beverages.  You may eat anything you prefer, but it is better to start with liquids then soups and crackers, and gradually work up to solid foods.  Please notify your doctor immediately if you have any unusual bleeding, trouble breathing or pain that is not related to your normal pain.  Depending on the type of procedure that was done, some parts of your body may feel week and/or numb.  This usually clears up by tonight or the next day.  Walk with the use of an assistive device or accompanied by an adult for the 24 hours.  You may use ice on the affected area for the first 24 hours.  Put ice in a Ziploc bag and cover with a towel and place against area 15 minutes on 15 minutes off.  You may switch to heat after 24 hours.Epidural Steroid Injection An epidural steroid injection is a shot of steroid medicine and numbing medicine that is given into the space between the spinal cord and the bones in your back (epidural space). The shot helps relieve pain caused by an irritated or swollen nerve root. The amount of pain relief you get from the injection depends on what is causing the nerve to be swollen and irritated,  and how long your pain lasts. You are more likely to benefit from this injection if your pain is strong and comes on suddenly rather than if you have had pain for a long time. Tell a health care provider about:  Any allergies you have.  All medicines you are taking, including vitamins, herbs, eye drops, creams, and over-the-counter medicines.  Any problems you or family members have had with anesthetic medicines.  Any blood disorders you have.  Any surgeries you have had.  Any medical conditions you have.  Whether you are pregnant or may be pregnant. What are the risks? Generally, this is a safe procedure. However, problems may occur, including:  Headache.  Bleeding.  Infection.  Allergic reaction to medicines.  Damage to your nerves. What happens before the procedure? Staying hydrated  Follow instructions from your health care provider about hydration, which may include:  Up to 2 hours before the procedure - you may continue to drink clear liquids, such as water, clear fruit juice, black coffee, and plain tea. Eating and drinking restrictions  Follow instructions from your health care provider about eating and drinking, which may include:  8 hours before the procedure - stop eating heavy meals or foods such as meat, fried foods, or fatty foods.  6 hours before the procedure - stop eating light meals or foods, such as toast or cereal.  6 hours before the procedure - stop drinking milk or drinks that contain milk.    2 hours before the procedure - stop drinking clear liquids. Medicine  You may be given medicines to lower anxiety.  Ask your health care provider about:  Changing or stopping your regular medicines. This is especially important if you are taking diabetes medicines or blood thinners.  Taking medicines such as aspirin and ibuprofen. These medicines can thin your blood. Do not take these medicines before your procedure if your health care provider instructs  you not to. General instructions  Plan to have someone take you home from the hospital or clinic. What happens during the procedure?  You may receive a medicine to help you relax (sedative).  You will be asked to lie on your abdomen.  The injection site will be cleaned.  A numbing medicine (local anesthetic) will be used to numb the injection site.  A needle will be inserted through your skin into the epidural space. You may feel some discomfort when this happens. An X-ray machine will be used to make sure the needle is put as close as possible to the affected nerve.  A steroid medicine and a local anesthetic will be injected into the epidural space.  The needle will be removed.  A bandage (dressing) will be put over the injection site. What happens after the procedure?  Your blood pressure, heart rate, breathing rate, and blood oxygen level will be monitored until the medicines you were given have worn off.  Your arm or leg may feel weak or numb for a few hours.  The injection site may feel sore.  Do not drive for 24 hours if you received a sedative. This information is not intended to replace advice given to you by your health care provider. Make sure you discuss any questions you have with your health care provider. Document Released: 12/28/2007 Document Revised: 03/03/2016 Document Reviewed: 01/06/2016 Elsevier Interactive Patient Education  2017 [employer]  

## 2016-10-27 NOTE — Progress Notes (Signed)
Safety precautions to be maintained throughout the outpatient stay will include: orient to surroundings, keep bed in low position, maintain call bell within reach at all times, provide assistance with transfer out of bed and ambulation.  

## 2016-10-28 ENCOUNTER — Telehealth: Payer: Self-pay | Admitting: *Deleted

## 2016-10-28 NOTE — Telephone Encounter (Signed)
Spoke with patient re; procedure on yesterday, verbalizes no complications or concerns.  

## 2016-11-23 ENCOUNTER — Ambulatory Visit: Payer: BLUE CROSS/BLUE SHIELD | Admitting: Pain Medicine

## 2016-11-23 NOTE — Progress Notes (Deleted)
Patient's Name: Caleb Edwards  MRN: [id number]  [facility]  DOB: 04/22/1951  [facility]  DOS: 11/23/2016  Note by: Sydnee LevansFrancisco A. Laban EmperorNaveira, MD  Service setting: Ambulatory outpatient  Specialty: Interventional Pain Management  Location: [facility] (AMB) Pain Management Facility    Patient type: Established   Primary Reason(s) for Visit: Encounter for post-procedure evaluation of chronic illness with mild to moderate exacerbation CC: No chief complaint on file.  HPI  [name] is a 66 y.o. year old, male patient, who comes today for a post-procedure evaluation. 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 His primarily concern today is the No chief complaint on file.  Pain Assessment: Self-Reported Pain Score:  /10             Reported level is compatible with observation.          [name] comes in today for post-procedure evaluation after the treatment done on 10/27/2016.  Further details on both, my assessment(s), as well as the proposed treatment plan, please see below.  Post-Procedure Assessment  10/27/2016 Procedure: Diagnostic left L3-4 interlaminar lumbar epidural steroid injection + left L4-5 transforaminal epidural steroid injection #1, under fluoroscopic  guidance, no IV sedation. Post-procedure pain score: 0/10 (100% relief) Influential Factors: BMI:   Intra-procedural challenges: None observed Assessment challenges: None detected         Post-procedural side-effects, adverse reactions, or complications: None reported Reported issues: None  Sedation: No sedation used. When no sedatives are used, the analgesic levels obtained are directly associated to the effectiveness of the local anesthetics. However, when sedation is provided, the level of analgesia obtained during the initial 1 hour following the intervention, is believed to be the result of a combination of factors. These factors may include, but are not limited to: 1. The effectiveness of the local anesthetics used. 2. The effects of the analgesic(s) and/or anxiolytic(s) used. 3. The degree of discomfort experienced by the patient at the time of the procedure. 4. The patients ability and reliability in recalling and recording the events. 5. The presence and influence of possible secondary gains and/or psychosocial factors. Reported result: Relief experienced during the 1st hour after the procedure:   (Ultra-Short Term Relief) Interpretative annotation: Analgesia during this period is likely to be Local Anesthetic and/or IV Sedative (Analgesic/Anxiolitic) related.          Effects of local anesthetic: The analgesic effects attained during this period are directly associated to the localized infiltration of local anesthetics and therefore cary significant diagnostic value as to the etiological location, or anatomical origin, of the pain. Expected duration of relief is directly dependent on the pharmacodynamics of the local anesthetic used. Long-acting (4-6 hours) anesthetics used.  Reported result: Relief during the next 4 to 6 hour after the procedure:   (Short-Term Relief) Interpretative annotation: Complete relief would suggest area to be the source of the pain.  Long-term  benefit: Defined as the period of time past the expected duration of local anesthetics. With the possible exception of prolonged sympathetic blockade from the local anesthetics, benefits during this period are typically attributed to, or associated with, other factors such as analgesic sensory neuropraxia, antiinflammatory effects, or beneficial biochemical changes provided by agents other than the local anesthetics Reported result: Extended relief following procedure:   (Long-Term Relief) Interpretative annotation: Good relief. This could suggest inflammation to be a significant component in the etiology to the pain.          Current benefits: Defined as persistent relief that continues at this point in time.   Reported results: Treated area: *** %       Interpretative annotation: Ongoing benefits would suggest effective therapeutic approach  Interpretation: Results would suggest a successful diagnostic intervention.          Laboratory Chemistry  Inflammation Markers Lab Results  Component Value Date   ESRSEDRATE 3 12/26/2015   CRP 0.6 12/26/2015   Renal Function Lab Results  Component Value Date   BUN 9 01/04/2016   CREATININE 1.07 01/04/2016   GFRAA >60 01/04/2016   GFRNONAA >60 01/04/2016   Hepatic Function Lab Results  Component Value Date   AST 18 01/02/2016   ALT 13 (L) 01/02/2016   ALBUMIN 3.7 01/02/2016   Electrolytes Lab Results  Component Value Date   NA 139 01/04/2016   K 3.4 (L) 01/04/2016   CL 113 (H) 01/04/2016   CALCIUM 8.1 (L) 01/04/2016   MG 1.9 01/04/2016   Pain Modulating Vitamins Lab Results  Component Value Date   VD25OH 24.0 (L) 12/26/2015   VITAMINB12 613 12/26/2015   Coagulation Parameters Lab Results  Component Value Date   INR 1.24 01/03/2016   LABPROT 15.8 (H) 01/03/2016   PLT 145 (L) 01/04/2016   Cardiovascular Lab Results  Component Value Date   HGB 7.4 (L) 01/04/2016   HCT 21.7 (L) 01/04/2016   Note: Lab results  reviewed.  Recent Diagnostic Imaging Review  Dg C-arm 1-60 Min-no Report  Result Date: 10/27/2016 There is no Radiologist interpretation  for this exam.  Note: Imaging results reviewed.          Meds  The patient has a current medication list which includes the following prescription(s): amlodipine, lisinopril-hydrochlorothiazide, pantoprazole, simvastatin, tramadol, and viagra.  Current Outpatient Prescriptions on File Prior to Visit  Medication Sig  . amLODipine (NORVASC) 2.5 MG tablet Take 2.5 mg by mouth daily.   Marland Kitchen lisinopril-hydrochlorothiazide (PRINZIDE,ZESTORETIC) 20-12.5 MG tablet Take 1 tablet by mouth daily.   . pantoprazole (PROTONIX) 40 MG tablet Take 1 tablet (40 mg total) by mouth 2 (two) times daily before a meal.  . simvastatin (ZOCOR) 40 MG tablet Take 40 mg by mouth daily at 6 PM.   . traMADol (ULTRAM) 50 MG tablet Take 2 tablets (100 mg total) by mouth every 6 (six) hours as needed for severe pain.  Marland Kitchen VIAGRA 100 MG tablet Take 100 mg by mouth as needed.    No current facility-administered medications on file prior to visit.    ROS  Constitutional: Denies any fever or chills Gastrointestinal: No reported hemesis, hematochezia, vomiting, or acute GI distress Musculoskeletal: Denies any acute onset joint swelling, redness, loss of ROM, or weakness Neurological: No reported episodes of acute onset apraxia, aphasia, dysarthria, agnosia, amnesia, paralysis, loss of coordination, or loss of consciousness  Allergies  Mr. Borntreger has No Known Allergies.  PFSH  Drug: Mr. Genova  reports that he does not use drugs. Alcohol:  reports that he does not drink alcohol. Tobacco:  reports that he has quit smoking. He has never used smokeless tobacco. Medical:  has a past medical history of Back pain; Blood transfusion without reported diagnosis (12/2015); Hypertension; and Ulcer, stomach peptic, chronic. Family: family history includes Diabetes in his mother; Heart disease in his  father.  Past Surgical History:  Procedure Laterality Date  . ESOPHAGOGASTRODUODENOSCOPY (EGD) WITH PROPOFOL N/A 01/03/2016   Procedure: ESOPHAGOGASTRODUODENOSCOPY (EGD) WITH PROPOFOL;  Surgeon: Scot Jun, MD;  Location: [facility] ENDOSCOPY;  Service: Endoscopy;  Laterality: N/A;   Constitutional Exam  General appearance: Well nourished, well developed, and well hydrated. In no apparent acute distress There were no vitals filed for this visit. BMI Assessment: Estimated body mass index is 25.09 kg/m as calculated from the following:   Height as of 10/27/16: 6' (1.829 m).   Weight as of 10/27/16: 185 lb (83.9 kg).  BMI interpretation table: BMI level Category Range association with higher incidence of chronic pain  <18 kg/m2 Underweight   18.5-24.9 kg/m2 Ideal body weight   25-29.9 kg/m2 Overweight Increased incidence by 20%  30-34.9 kg/m2 Obese (Class I) Increased incidence by 68%  35-39.9 kg/m2 Severe obesity (Class II) Increased incidence by 136%  >40 kg/m2 Extreme obesity (Class III) Increased incidence by 254%   BMI Readings from Last 4 Encounters:  10/27/16 25.09 kg/m  09/09/16 25.50 kg/m  03/22/16 25.09 kg/m  01/02/16 24.41 kg/m   Wt Readings from Last 4 Encounters:  10/27/16 185 lb (83.9 kg)  09/09/16 188 lb (85.3 kg)  03/22/16 185 lb (83.9 kg)  01/02/16 180 lb (81.6 kg)  Psych/Mental status: Alert, oriented x 3 (person, place, & time)       Eyes: PERLA Respiratory: No evidence of acute respiratory distress  Cervical Spine Exam  Inspection: No masses, redness, or swelling Alignment: Symmetrical Functional ROM: Unrestricted ROM Stability: No instability detected Muscle strength & Tone: Functionally intact Sensory: Unimpaired Palpation: Non-contributory  Upper Extremity (UE) Exam    Side: Right upper extremity  Side: Left upper extremity  Inspection: No masses, redness, swelling, or asymmetry. No contractures  Inspection: No masses, redness, swelling, or  asymmetry. No contractures  Functional ROM: Unrestricted ROM          Functional ROM: Unrestricted ROM          Muscle strength & Tone: Functionally intact  Muscle strength & Tone: Functionally intact  Sensory: Unimpaired  Sensory: Unimpaired  Palpation: Euthermic  Palpation: Euthermic  Specialized Test(s): Deferred         Specialized Test(s): Deferred          Thoracic Spine Exam  Inspection: No masses, redness, or swelling Alignment: Symmetrical Functional ROM: Unrestricted ROM Stability: No instability detected Sensory: Unimpaired Muscle strength & Tone: Functionally intact Palpation: Non-contributory  Lumbar Spine Exam  Inspection: No masses, redness, or swelling Alignment: Symmetrical Functional ROM: Unrestricted ROM Stability: No instability detected Muscle strength & Tone: Functionally intact Sensory: Unimpaired Palpation: Non-contributory Provocative Tests: Lumbar Hyperextension and rotation test: evaluation deferred today       Patrick's Maneuver: evaluation deferred today              Gait & Posture Assessment  Ambulation: Unassisted Gait: Relatively normal for age and body habitus Posture: WNL   Lower Extremity Exam    Side: Right lower extremity  Side: Left lower extremity  Inspection: No masses, redness, swelling, or asymmetry. No contractures  Inspection:  No masses, redness, swelling, or asymmetry. No contractures  Functional ROM: Unrestricted ROM          Functional ROM: Unrestricted ROM          Muscle strength & Tone: Functionally intact  Muscle strength & Tone: Functionally intact  Sensory: Unimpaired  Sensory: Unimpaired  Palpation: No palpable anomalies  Palpation: No palpable anomalies   Assessment  Primary Diagnosis & Pertinent Problem List: The primary encounter diagnosis was Chronic low back pain (Location of Primary Source of Pain) (Bilateral) (R>L). 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  Status Diagnosis  Controlled Controlled Controlled 1. Chronic low back pain (Location of Primary Source of Pain) (Bilateral) (R>L)   2. Lumbar facet syndrome (Location of Primary Source of Pain) (Bilateral) (R>L)   3. 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 Chronic lower extremity pain (Right)   5. 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  New Prescriptions   No medications on file   Medications administered today: Mr. Platte had no medications administered during this visit. Lab-work, procedure(s), and/or referral(s): No orders of the defined types were placed in this encounter.  Imaging and/or referral(s): None  Interventional therapies: Planned, scheduled, and/or pending:   Not at this time.   Considering:   Diagnostic left L3-4 interlaminar lumbar epidural steroid injection + left L4-5 transforaminal epidural steroid injection #2, under fluoroscopic guidance. Diagnostic bilateral lumbar facet block under fluoroscopic guidance and IV sedation.  Possible bilateral lumbar facet radiofrequency ablation. Diagnostic bilateral L4-5 transforaminal epidural steroid injection under fluoroscopic guidance and IV sedation.    Palliative PRN treatment(s):   Diagnostic bilateral lumbar facet block under fluoroscopic guidance and IV sedation.  Diagnostic bilateral L4-5 transforaminal epidural steroid injection under fluoroscopic guidance and IV sedation. Diagnostic left L3-4 interlaminar lumbar epidural steroid injection   Provider-requested follow-up: No Follow-up on file.  Future Appointments Date Time Provider Department Center  11/23/2016 7:45 AM Delano Metz,  MD [facility] None  03/10/2017 8:15 AM Delano Metz, MD [facility] None   Primary Care Physician: [facility] Location: [facility] Outpatient Pain Management Facility Note by: Sydnee Levans. Laban Emperor, M.D, DABA, DABAPM, DABPM, DABIPP, FIPP Date: 11/23/2016; Time: 7:42 AM  Pain Score Disclaimer: We use the NRS-11 scale. This is a self-reported, subjective measurement of pain severity with only modest accuracy. It is used primarily to identify changes within a particular patient. It must be understood that outpatient pain scales are significantly less accurate that those used for research, where they can be applied under ideal controlled circumstances with minimal exposure to variables. In reality, the score is likely to be a combination of pain intensity and pain affect, where pain affect describes the degree of emotional arousal or changes in action readiness caused by the sensory experience of pain. Factors such as social and work situation, setting, emotional state, anxiety levels, expectation, and prior pain experience may influence pain perception and show large inter-individual differences that may also be affected by time variables.  Patient instructions provided during this appointment: There are no Patient Instructions on file for this visit.

## 2017-03-09 NOTE — Progress Notes (Deleted)
Patient's Name: Caleb Edwards  MRN: [id number]  Referring Provider: Inc, [place]: November 14, 1950  PCP: Inc, [place]: 03/10/2017  Note by: Kathlen Brunswick. Dossie Arbour, MD  Service setting: Ambulatory outpatient  Specialty: Interventional Pain Management  Location: [facility] (AMB) Pain Management Facility    Patient type: Established   Primary Reason(s) for Visit: Encounter for prescription drug management & post-procedure evaluation of chronic illness with mild to moderate exacerbation(Level of risk: moderate) CC: No chief complaint on file.  HPI  Caleb Edwards is a 66 y.o. year old, male patient, who comes today for a post-procedure evaluation and medication management. 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 His primarily concern today is the No chief complaint on file.  Pain Assessment: Self-Reported Pain Score:  /10             Reported level is compatible with observation.          Caleb Edwards was last seen on 10/27/2016 for a procedure. During today's appointment we reviewed Caleb Edwards post-procedure results, as well as his outpatient medication regimen.The patient did not keep that 11/23/2016 follow-up appointment after the procedure.  Further details on both, my assessment(s), as well  as the proposed treatment plan, please see below.  Controlled Substance Pharmacotherapy Assessment REMS (Risk Evaluation and Mitigation Strategy)  Analgesic:Tramadol 50 mg 2 tablets every 6 hours (400 mg/day) MME/day:40 mg/day No notes on file Pharmacokinetics: Liberation and absorption (onset of action): WNL Distribution (time to peak effect): WNL Metabolism and excretion (duration of action): WNL         Pharmacodynamics: Desired effects: Analgesia: Caleb Edwards reports >50% benefit. Functional ability: Patient reports that medication allows him to accomplish basic ADLs Clinically meaningful improvement in function (CMIF): Sustained CMIF goals met Perceived effectiveness: Described as relatively effective, allowing for increase in activities of daily living (ADL) Undesirable effects: Side-effects or Adverse reactions: None reported Monitoring: [place] PMP: Online review of the past 48-monthperiod conducted. Compliant with practice rules and regulations List of all UDS test(s) done:  Lab Results  Component Value Date   TOXASSSELUR FINAL 09/09/2016   TOXASSSELUR FINAL 12/22/2015   TOXASSSELUR FINAL 09/24/2015   Last UDS on record: ToxAssure Select 13  Date Value Ref Range Status  09/09/2016 FINAL  Final    Comment:    ==================================================================== TOXASSURE SELECT 13 (MW) ==================================================================== Test                             Result       Flag       Units Drug Present and Declared for Prescription Verification   Tramadol                       PRESENT      EXPECTED   O-Desmethyltramadol            PRESENT  EXPECTED   N-Desmethyltramadol            PRESENT      EXPECTED    Source of tramadol is a prescription medication.    O-desmethyltramadol and N-desmethyltramadol are expected    metabolites of tramadol. ==================================================================== Test                       Result    Flag   Units      Ref Range   Creatinine              75               mg/dL      >=20 ==================================================================== Declared Medications:  The flagging and interpretation on this report are based on the  following declared medications.  Unexpected results may arise from  inaccuracies in the declared medications.  **Note: The testing scope of this panel includes these medications:  Tramadol (Ultram)  **Note: The testing scope of this panel does not include following  reported medications:  Amlodipine (Norvasc)  Hydrochlorothiazide (Prinzide)  Iron (Ferrous Sulfate)  Lisinopril (Prinzide)  Pantoprazole (Protonix)  Sildenafil (Viagra)  Simvastatin (Zocor)  Turmeric  Vitamin D3 ==================================================================== For clinical consultation, please call [phone]. ====================================================================    UDS interpretation: Compliant          Medication Assessment Form: Reviewed. Patient indicates being compliant with therapy Treatment compliance: Compliant Risk Assessment Profile: Aberrant behavior: See prior evaluations. None observed or detected today Comorbid factors increasing risk of overdose: See prior notes. No additional risks detected today Risk of substance use disorder (SUD): Low Opioid Risk Tool (ORT) Total Score:    Interpretation Table:  Score <3 = Low Risk for SUD  Score between 4-7 = Moderate Risk for SUD  Score >8 = High Risk for Opioid Abuse   Risk Mitigation Strategies:  Patient Counseling: Covered Patient-Prescriber Agreement (PPA): Present and active  Notification to other healthcare providers: Done  Pharmacologic Plan: No change in therapy, at this time  Post-Procedure Assessment  10/27/2016 Procedure: Left L3-4 interlaminar laminar lumbar epidural steroid injection + left L4-5 transforaminal epidural steroid injection, under  fluoroscopic guidance, no sedation Pre-procedure pain score:  4/10 Post-procedure pain score: 0/10 (100% relief) Influential Factors: BMI:   Intra-procedural challenges: None observed Assessment challenges: None detected         Post-procedural side-effects, adverse reactions, or complications: None reported Reported issues: None  Sedation: No sedation used. When no sedatives are used, the analgesic levels obtained are directly associated to the effectiveness of the local anesthetics. However, when sedation is provided, the level of analgesia obtained during the initial 1 hour following the intervention, is believed to be the result of a combination of factors. These factors may include, but are not limited to: 1. The effectiveness of the local anesthetics used. 2. The effects of the analgesic(s) and/or anxiolytic(s) used. 3. The degree of discomfort experienced by the patient at the time of the procedure. 4. The patients ability and reliability in recalling and recording the events. 5. The presence and influence of possible secondary gains and/or psychosocial factors. Reported result: Relief experienced during the 1st hour after the procedure:   (Ultra-Short Term Relief) Interpretative annotation: No Analgesic or Anxiolytic given, therefore benefits are completely due to Local Anesthetics.          Effects of local anesthetic: The analgesic effects attained during this period are directly associated to the localized infiltration of local anesthetics and  therefore cary significant diagnostic value as to the etiological location, or anatomical origin, of the pain. Expected duration of relief is directly dependent on the pharmacodynamics of the local anesthetic used. Long-acting (4-6 hours) anesthetics used.  Reported result: Relief during the next 4 to 6 hour after the procedure:   (Short-Term Relief) Interpretative annotation: Complete relief would suggest area to be the source of the pain.           Long-term benefit: Defined as the period of time past the expected duration of local anesthetics. With the possible exception of prolonged sympathetic blockade from the local anesthetics, benefits during this period are typically attributed to, or associated with, other factors such as analgesic sensory neuropraxia, antiinflammatory effects, or beneficial biochemical changes provided by agents other than the local anesthetics Reported result: Extended relief following procedure:   (Long-Term Relief) Interpretative annotation: Good relief. This could suggest inflammation to be a significant component in the etiology to the pain.          Current benefits: Defined as persistent relief that continues at this point in time.   Reported results: Treated area: *** %       Interpretative annotation: Ongoing benefits would suggest effective therapeutic approach  Interpretation: Results would suggest a successful diagnostic intervention.          Laboratory Chemistry  Inflammation Markers Lab Results  Component Value Date   CRP 0.6 12/26/2015   ESRSEDRATE 3 12/26/2015   (CRP: Acute Phase) (ESR: Chronic Phase) Renal Function Markers Lab Results  Component Value Date   BUN 9 01/04/2016   CREATININE 1.07 01/04/2016   GFRAA >60 01/04/2016   GFRNONAA >60 01/04/2016   Hepatic Function Markers Lab Results  Component Value Date   AST 18 01/02/2016   ALT 13 (L) 01/02/2016   ALBUMIN 3.7 01/02/2016   ALKPHOS 48 01/02/2016   Electrolytes Lab Results  Component Value Date   NA 139 01/04/2016   K 3.4 (L) 01/04/2016   CL 113 (H) 01/04/2016   CALCIUM 8.1 (L) 01/04/2016   MG 1.9 01/04/2016   Neuropathy Markers Lab Results  Component Value Date   VITAMINB12 613 12/26/2015   Bone Pathology Markers Lab Results  Component Value Date   ALKPHOS 48 01/02/2016   VD25OH 24.0 (L) 12/26/2015   CALCIUM 8.1 (L) 01/04/2016   Coagulation Parameters Lab Results  Component Value Date   INR 1.24  01/03/2016   LABPROT 15.8 (H) 01/03/2016   PLT 145 (L) 01/04/2016   Cardiovascular Markers Lab Results  Component Value Date   HGB 7.4 (L) 01/04/2016   HCT 21.7 (L) 01/04/2016   Note: Lab results reviewed.  Recent Diagnostic Imaging Review  Dg C-arm 1-60 Min-no Report  Result Date: 10/27/2016 There is no Radiologist interpretation  for this exam.  Note: Imaging results reviewed.          Meds  The patient has a current medication list which includes the following prescription(s): amlodipine, lisinopril-hydrochlorothiazide, pantoprazole, simvastatin, tramadol, and viagra.  Current Outpatient Prescriptions on File Prior to Visit  Medication Sig  . amLODipine (NORVASC) 2.5 MG tablet Take 2.5 mg by mouth daily.   Marland Kitchen lisinopril-hydrochlorothiazide (PRINZIDE,ZESTORETIC) 20-12.5 MG tablet Take 1 tablet by mouth daily.   . pantoprazole (PROTONIX) 40 MG tablet Take 1 tablet (40 mg total) by mouth 2 (two) times daily before a meal.  . simvastatin (ZOCOR) 40 MG tablet Take 40 mg by mouth daily at 6 PM.   . traMADol (ULTRAM) 50 MG tablet  Take 2 tablets (100 mg total) by mouth every 6 (six) hours as needed for severe pain.  Marland Kitchen VIAGRA 100 MG tablet Take 100 mg by mouth as needed.    No current facility-administered medications on file prior to visit.    ROS  Constitutional: Denies any fever or chills Gastrointestinal: No reported hemesis, hematochezia, vomiting, or acute GI distress Musculoskeletal: Denies any acute onset joint swelling, redness, loss of ROM, or weakness Neurological: No reported episodes of acute onset apraxia, aphasia, dysarthria, agnosia, amnesia, paralysis, loss of coordination, or loss of consciousness  Allergies  Caleb Edwards has No Known Allergies.  PFSH  Drug: Caleb Edwards  reports that he does not use drugs. Alcohol:  reports that he does not drink alcohol. Tobacco:  reports that he has quit smoking. He has never used smokeless tobacco. Medical:  has a past medical  history of Back pain; Blood transfusion without reported diagnosis (12/2015); Hypertension; and Ulcer, stomach peptic, chronic. Family: family history includes Diabetes in his mother; Heart disease in his father.  Past Surgical History:  Procedure Laterality Date  . ESOPHAGOGASTRODUODENOSCOPY (EGD) WITH PROPOFOL N/A 01/03/2016   Procedure: ESOPHAGOGASTRODUODENOSCOPY (EGD) WITH PROPOFOL;  Surgeon: Manya Silvas, MD;  Location: [facility] ENDOSCOPY;  Service: Endoscopy;  Laterality: N/A;   Constitutional Exam  General appearance: Well nourished, well developed, and well hydrated. In no apparent acute distress There were no vitals filed for this visit. BMI Assessment: Estimated body mass index is 25.09 kg/m as calculated from the following:   Height as of 10/27/16: 6' (1.829 m).   Weight as of 10/27/16: 185 lb (83.9 kg).  BMI interpretation table: BMI level Category Range association with higher incidence of chronic pain  <18 kg/m2 Underweight   18.5-24.9 kg/m2 Ideal body weight   25-29.9 kg/m2 Overweight Increased incidence by 20%  30-34.9 kg/m2 Obese (Class I) Increased incidence by 68%  35-39.9 kg/m2 Severe obesity (Class II) Increased incidence by 136%  >40 kg/m2 Extreme obesity (Class III) Increased incidence by 254%   BMI Readings from Last 4 Encounters:  10/27/16 25.09 kg/m  09/09/16 25.50 kg/m  03/22/16 25.09 kg/m  01/02/16 24.41 kg/m   Wt Readings from Last 4 Encounters:  10/27/16 185 lb (83.9 kg)  09/09/16 188 lb (85.3 kg)  03/22/16 185 lb (83.9 kg)  01/02/16 180 lb (81.6 kg)  Psych/Mental status: Alert, oriented x 3 (person, place, & time)       Eyes: PERLA Respiratory: No evidence of acute respiratory distress  Cervical Spine Exam  Inspection: No masses, redness, or swelling Alignment: Symmetrical Functional ROM: Unrestricted ROM      Stability: No instability detected Muscle strength & Tone: Functionally intact Sensory: Unimpaired Palpation: No palpable anomalies               Upper Extremity (UE) Exam    Side: Right upper extremity  Side: Left upper extremity  Inspection: No masses, redness, swelling, or asymmetry. No contractures  Inspection: No masses, redness, swelling, or asymmetry. No contractures  Functional ROM: Unrestricted ROM          Functional ROM: Unrestricted ROM          Muscle strength & Tone: Functionally intact  Muscle strength & Tone: Functionally intact  Sensory: Unimpaired  Sensory: Unimpaired  Palpation: No palpable anomalies              Palpation: No palpable anomalies              Specialized Test(s): Deferred  Specialized Test(s): Deferred          Thoracic Spine Exam  Inspection: No masses, redness, or swelling Alignment: Symmetrical Functional ROM: Unrestricted ROM Stability: No instability detected Sensory: Unimpaired Muscle strength & Tone: No palpable anomalies  Lumbar Spine Exam  Inspection: No masses, redness, or swelling Alignment: Symmetrical Functional ROM: Unrestricted ROM      Stability: No instability detected Muscle strength & Tone: Functionally intact Sensory: Unimpaired Palpation: No palpable anomalies       Provocative Tests: Lumbar Hyperextension and rotation test: evaluation deferred today       Patrick's Maneuver: evaluation deferred today                    Gait & Posture Assessment  Ambulation: Unassisted Gait: Relatively normal for age and body habitus Posture: WNL   Lower Extremity Exam    Side: Right lower extremity  Side: Left lower extremity  Inspection: No masses, redness, swelling, or asymmetry. No contractures  Inspection: No masses, redness, swelling, or asymmetry. No contractures  Functional ROM: Unrestricted ROM          Functional ROM: Unrestricted ROM          Muscle strength & Tone: Functionally intact  Muscle strength & Tone: Functionally intact  Sensory: Unimpaired  Sensory: Unimpaired  Palpation: No palpable anomalies  Palpation: No palpable anomalies    Assessment  Primary Diagnosis & Pertinent Problem List: The primary encounter diagnosis was Lumbar facet syndrome (Location of Primary Source of Pain) (Bilateral) (R>L). Diagnoses of Chronic low back pain (Location of Primary Source of Pain) (Bilateral) (R>L), Long term current use of opiate analgesic, Opiate use, and Chronic pain syndrome were also pertinent to this visit.  Status Diagnosis  Controlled Controlled Controlled 1. Lumbar facet syndrome (Location of Primary Source of Pain) (Bilateral) (R>L)   2. Chronic low back pain (Location of Primary Source of Pain) (Bilateral) (R>L)   3. Long term current use of opiate analgesic   4. Opiate use   5. Chronic pain syndrome     Problems updated and reviewed during this visit: No problems updated. Plan of Care  Pharmacotherapy (Medications Ordered): No orders of the defined types were placed in this encounter.  New Prescriptions   No medications on file   Medications administered today: Caleb Edwards had no medications administered during this visit. Lab-work, procedure(s), and/or referral(s): No orders of the defined types were placed in this encounter.  Imaging and/or referral(s): None  Interventional therapies: Planned, scheduled, and/or pending:   Not at this time.   Considering:   Diagnostic bilateral lumbar facet block  Possible bilateral lumbar facet radiofrequency ablation. Diagnostic bilateral L4-5 transforaminal epidural steroid injection    Palliative PRN treatment(s):   Palliative bilateral lumbar facet block  Palliative bilateral L4-5 transforaminal epidural steroid injection    Provider-requested follow-up: No Follow-up on file.  Future Appointments Date Time Provider Grace  03/10/2017 8:30 AM Milinda Pointer, MD [facility] None   Primary Care Physician: Inc, [place] Location: [facility] Outpatient Pain Management Facility Note by: Kathlen Brunswick. Dossie Arbour, M.D, DABA, DABAPM, DABPM,  DABIPP, FIPP Date: 03/10/2017; Time: 7:24 PM  Patient instructions provided during this appointment: There are no Patient Instructions on file for this visit.

## 2017-03-10 ENCOUNTER — Encounter: Payer: BLUE CROSS/BLUE SHIELD | Admitting: Pain Medicine

## 2017-03-23 ENCOUNTER — Telehealth: Payer: Self-pay | Admitting: Pain Medicine

## 2017-03-24 ENCOUNTER — Encounter: Payer: Self-pay | Admitting: Nurse Practitioner

## 2017-03-24 ENCOUNTER — Telehealth: Payer: Self-pay

## 2017-03-24 ENCOUNTER — Other Ambulatory Visit: Payer: Self-pay | Admitting: Nurse Practitioner

## 2017-03-24 ENCOUNTER — Ambulatory Visit: Payer: BLUE CROSS/BLUE SHIELD | Attending: Nurse Practitioner | Admitting: Nurse Practitioner

## 2017-03-24 VITALS — BP 137/74 | HR 103 | Temp 98.4°F | Resp 14 | Ht 72.0 in | Wt 180.0 lb

## 2017-03-24 DIAGNOSIS — M4726 Other spondylosis with radiculopathy, lumbar region: Secondary | ICD-10-CM | POA: Insufficient documentation

## 2017-03-24 DIAGNOSIS — G894 Chronic pain syndrome: Secondary | ICD-10-CM

## 2017-03-24 DIAGNOSIS — M488X6 Other specified spondylopathies, lumbar region: Secondary | ICD-10-CM | POA: Diagnosis not present

## 2017-03-24 DIAGNOSIS — M545 Low back pain: Secondary | ICD-10-CM | POA: Diagnosis present

## 2017-03-24 DIAGNOSIS — Z5181 Encounter for therapeutic drug level monitoring: Secondary | ICD-10-CM | POA: Diagnosis not present

## 2017-03-24 DIAGNOSIS — G8929 Other chronic pain: Secondary | ICD-10-CM

## 2017-03-24 DIAGNOSIS — M791 Myalgia: Secondary | ICD-10-CM | POA: Insufficient documentation

## 2017-03-24 DIAGNOSIS — Z79891 Long term (current) use of opiate analgesic: Secondary | ICD-10-CM

## 2017-03-24 DIAGNOSIS — Z8711 Personal history of peptic ulcer disease: Secondary | ICD-10-CM | POA: Insufficient documentation

## 2017-03-24 DIAGNOSIS — Z8249 Family history of ischemic heart disease and other diseases of the circulatory system: Secondary | ICD-10-CM | POA: Diagnosis not present

## 2017-03-24 DIAGNOSIS — Z87891 Personal history of nicotine dependence: Secondary | ICD-10-CM | POA: Diagnosis not present

## 2017-03-24 DIAGNOSIS — M5442 Lumbago with sciatica, left side: Secondary | ICD-10-CM | POA: Diagnosis not present

## 2017-03-24 DIAGNOSIS — E559 Vitamin D deficiency, unspecified: Secondary | ICD-10-CM | POA: Diagnosis not present

## 2017-03-24 DIAGNOSIS — M48061 Spinal stenosis, lumbar region without neurogenic claudication: Secondary | ICD-10-CM | POA: Insufficient documentation

## 2017-03-24 DIAGNOSIS — G4700 Insomnia, unspecified: Secondary | ICD-10-CM | POA: Diagnosis not present

## 2017-03-24 DIAGNOSIS — D649 Anemia, unspecified: Secondary | ICD-10-CM | POA: Diagnosis not present

## 2017-03-24 DIAGNOSIS — M47812 Spondylosis without myelopathy or radiculopathy, cervical region: Secondary | ICD-10-CM | POA: Diagnosis not present

## 2017-03-24 DIAGNOSIS — M47816 Spondylosis without myelopathy or radiculopathy, lumbar region: Secondary | ICD-10-CM

## 2017-03-24 DIAGNOSIS — M4696 Unspecified inflammatory spondylopathy, lumbar region: Secondary | ICD-10-CM

## 2017-03-24 DIAGNOSIS — Z79899 Other long term (current) drug therapy: Secondary | ICD-10-CM | POA: Insufficient documentation

## 2017-03-24 DIAGNOSIS — I10 Essential (primary) hypertension: Secondary | ICD-10-CM | POA: Diagnosis not present

## 2017-03-24 DIAGNOSIS — Z833 Family history of diabetes mellitus: Secondary | ICD-10-CM | POA: Diagnosis not present

## 2017-03-24 MED ORDER — TRAMADOL HCL 50 MG PO TABS: 100.0000 mg | ORAL_TABLET | Freq: Four times a day (QID) | ORAL | 5 refills | Status: DC | PRN

## 2017-03-24 NOTE — Patient Instructions (Addendum)
Meds  The patient @[facility] @  Current Outpatient Prescriptions on File Prior to Visit  Medication Sig  . amLODipine (NORVASC) 2.5 MG tablet Take 2.5 mg by mouth daily.   Marland Kitchen lisinopril-hydrochlorothiazide (PRINZIDE,ZESTORETIC) 20-12.5 MG tablet Take 1 tablet by mouth daily.   . pantoprazole (PROTONIX) 40 MG tablet Take 1 tablet (40 mg total) by mouth 2 (two) times daily before a meal.  . simvastatin (ZOCOR) 40 MG tablet Take 40 mg by mouth daily at 6 PM.   . VIAGRA 100 MG tablet Take 100 mg by mouth as needed.   . traMADol (ULTRAM) 50 MG tablet Take 2 tablets (100 mg total) by mouth every 6 (six) hours as needed for severe pain.   No current facility-administered medications on file prior to visit.    You were given one prescription for Tramadol to last 6 months____________________________________________________________________________________________  Pain Scale  Introduction: The pain score used by this practice is the Verbal Numerical Rating Scale (VNRS-11). This is an 11-point scale. It is for adults and children 10 years or older. There are significant differences in how the pain score is reported, used, and applied. Forget everything you learned in the past and learn this scoring system.  General Information: The scale should reflect your current level of pain. Unless you are specifically asked for the level of your worst pain, or your average pain. If you are asked for one of these two, then it should be understood that it is over the past 24 hours.  Basic Activities of Daily Living (ADL): Personal hygiene, dressing, eating, transferring, and using restroom.  Instructions: Most patients tend to report their level of pain as a combination of two factors, their physical pain and their psychosocial pain. This last one is also known as "suffering" and it is reflection of how physical pain affects you socially and psychologically. From now on, report them separately. From this point on, when  asked to report your pain level, report only your physical pain. Use the following table for reference.  Pain Clinic Pain Levels (0-5/10)  Pain Level Score  Description  No Pain 0   Mild pain 1 Nagging, annoying, but does not interfere with basic activities of daily living (ADL). Patients are able to eat, bathe, get dressed, toileting (being able to get on and off the toilet and perform personal hygiene functions), transfer (move in and out of bed or a chair without assistance), and maintain continence (able to control bladder and bowel functions). Blood pressure and heart rate are unaffected. A normal heart rate for a healthy adult ranges from 60 to 100 bpm (beats per minute).   Mild to moderate pain 2 Noticeable and distracting. Impossible to hide from other people. More frequent flare-ups. Still possible to adapt and function close to normal. It can be very annoying and may have occasional stronger flare-ups. With discipline, patients may get used to it and adapt.   Moderate pain 3 Interferes significantly with activities of daily living (ADL). It becomes difficult to feed, bathe, get dressed, get on and off the toilet or to perform personal hygiene functions. Difficult to get in and out of bed or a chair without assistance. Very distracting. With effort, it can be ignored when deeply involved in activities.   Moderately severe pain 4 Impossible to ignore for more than a few minutes. With effort, patients may still be able to manage work or participate in some social activities. Very difficult to concentrate. Signs of autonomic nervous system discharge are evident:  dilated pupils (mydriasis); mild sweating (diaphoresis); sleep interference. Heart rate becomes elevated (>115 bpm). Diastolic blood pressure (lower number) rises above 100 mmHg. Patients find relief in laying down and not moving.   Severe pain 5 Intense and extremely unpleasant. Associated with frowning face and frequent crying. Pain  overwhelms the senses.  Ability to do any activity or maintain social relationships becomes significantly limited. Conversation becomes difficult. Pacing back and forth is common, as getting into a comfortable position is nearly impossible. Pain wakes you up from deep sleep. Physical signs will be obvious: pupillary dilation; increased sweating; goosebumps; brisk reflexes; cold, clammy hands and feet; nausea, vomiting or dry heaves; loss of appetite; significant sleep disturbance with inability to fall asleep or to remain asleep. When persistent, significant weight loss is observed due to the complete loss of appetite and sleep deprivation.  Blood pressure and heart rate becomes significantly elevated. Caution: If elevated blood pressure triggers a pounding headache associated with blurred vision, then the patient should immediately seek attention at an urgent or emergency care unit, as these may be signs of an impending stroke.    Emergency Department Pain Levels (6-10/10)  Emergency Room Pain 6 Severely limiting. Requires emergency care and should not be seen or managed at an outpatient pain management facility. Communication becomes difficult and requires great effort. Assistance to reach the emergency department may be required. Facial flushing and profuse sweating along with potentially dangerous increases in heart rate and blood pressure will be evident.   Distressing pain 7 Self-care is very difficult. Assistance is required to transport, or use restroom. Assistance to reach the emergency department will be required. Tasks requiring coordination, such as bathing and getting dressed become very difficult.   Disabling pain 8 Self-care is no longer possible. At this level, pain is disabling. The individual is unable to do even the most "basic" activities such as walking, eating, bathing, dressing, transferring to a bed, or toileting. Fine motor skills are lost. It is difficult to think clearly.    Incapacitating pain 9 Pain becomes incapacitating. Thought processing is no longer possible. Difficult to remember your own name. Control of movement and coordination are lost.   The worst pain imaginable 10 At this level, most patients pass out from pain. When this level is reached, collapse of the autonomic nervous system occurs, leading to a sudden drop in blood pressure and heart rate. This in turn results in a temporary and dramatic drop in blood flow to the brain, leading to a loss of consciousness. Fainting is one of the body's self defense mechanisms. Passing out puts the brain in a calmed state and causes it to shut down for a while, in order to begin the healing process.    Summary: 1. Refer to this scale when providing us with your pain level. 2. Be accurate and careful when reporting your pain level. This will help with your care. 3. Over-reporting your pain level will lead to loss of credibility. 4. Even a level of 1/10 means that there is pain and will be treated at our facility. 5. High, inaccurate reporting will be documented as "Symptom Exaggeration", leading to loss of credibility and suspicions of possible secondary gains such as obtaining more narcotics, or wanting to appear disabled, for fraudulent reasons. 6. Only pain levels of 5 or below will be seen at our facility. 7. Pain levels of 6 and above will be sent to the Emergency Department and the appointment cancelled. ____________________________________________________________________________________________

## 2017-03-24 NOTE — Progress Notes (Signed)
Nursing Pain Medication Assessment:  Safety precautions to be maintained throughout the outpatient stay will include: orient to surroundings, keep bed in low position, maintain call bell within reach at all times, provide assistance with transfer out of bed and ambulation.  Medication Inspection Compliance: Mr. Magaw did not comply with our request to bring his pills to be counted. He was reminded that bringing the medication bottles, even when empty, is a requirement.  Medication: None brought in. Pill/Patch Count: None available to be counted. Bottle Appearance: No container available. Did not bring bottle(s) to appointment. Filled Date: N/A Last Medication intake:  Today 

## 2017-03-24 NOTE — Telephone Encounter (Signed)
Pt not available but states his [place]CP is [place] [facility]. Mr Caleb Edwards to call back and will ask about lab work at that time

## 2017-03-24 NOTE — Telephone Encounter (Signed)
Error

## 2017-03-24 NOTE — Progress Notes (Signed)
Patient's Name: Caleb Edwards  MRN: [id number]  Referring Provider: [facility], [place]: Jul 31, 1951  PCP: [facility], [place]: 03/24/2017  Note by: Vevelyn Francois NP  Service setting: Ambulatory outpatient  Specialty: Interventional Pain Management  Location: [facility] (AMB) Pain Management Facility    Patient type: Established    Primary Reason(s) for Visit: Encounter for prescription drug management & post-procedure evaluation of chronic illness with mild to moderate exacerbation(Level of risk: moderate) CC: Back Pain (lower)  HPI  Mr. Edmister is a 66 y.o. year old, male patient, who comes today for a post-procedure evaluation and medication management. 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 His primarily concern today is the Back Pain (lower)  Pain Assessment: Self-Reported Pain Score: 6 /10 Clinically the patient looks like a 1/10 Reported level is inconsistent with clinical observations. Information on the proper use of the pain scale provided to the patient today Pain Type: Chronic pain Pain Location: Back Pain Orientation: Lower Pain Descriptors / Indicators: Burning, Tingling Pain Frequency: Intermittent  Mr. Leisinger was last seen on Visit date not found for a procedure. During  today's appointment we reviewed Mr. Brandy post-procedure results, as well as his outpatient medication regimen. He has chronic low back pain. He has radicular symptoms that do down into the back his right leg into his thigh. He has numbness and tingling denies weakness. He admits that he has occasional constipation. He uses over-the-counter medication along with diet changes which are currently effective.  Further details on both, my assessment(s), as well as the proposed treatment plan, please see below.  Controlled Substance Pharmacotherapy Assessment REMS (Risk Evaluation and Mitigation Strategy)  Analgesic:Tramadol 50 mg 2 tablets every 6 hours (400 mg/day) MME/day:40 mg/day Landis Martins, RN  03/24/2017  8:52 AM  Sign at close encounter Nursing Pain Medication Assessment:  Safety precautions to be maintained throughout the outpatient stay will include: orient to surroundings, keep bed in low position, maintain call bell within reach at all times, provide assistance with transfer out of bed and ambulation.  Medication Inspection Compliance: Mr. Cormier did not comply with our request to bring his pills to be counted. He was reminded that bringing the medication bottles, even when empty, is a requirement.  Medication: None brought in. Pill/Patch Count: None available to be counted. Bottle Appearance: No container available. Did not bring bottle(s) to appointment. Filled Date: N/A Last Medication intake:  Today   Pharmacokinetics: Liberation and absorption (onset of action): WNL Distribution (time to peak effect): WNL Metabolism and excretion (duration of action): WNL         Pharmacodynamics: Desired effects: Analgesia: Mr. Ferrando reports >50% benefit. Functional ability: Patient reports that medication allows him to accomplish basic ADLs Clinically meaningful improvement in function (CMIF): Sustained CMIF goals met Perceived effectiveness: Described as relatively effective,  allowing for increase in activities of daily living (ADL) Undesirable effects: Side-effects or Adverse reactions: None reported  Monitoring: [place] PMP: Online review of the past 60-monthperiod conducted. Compliant with practice rules and regulations List of all UDS test(s) done:  Lab Results  Component Value Date   TOXASSSELUR FINAL 09/09/2016   TOXASSSELUR FINAL 12/22/2015   TOXASSSELUR FINAL 09/24/2015   Last UDS on record: ToxAssure Select 13  Date Value Ref Range Status  09/09/2016 FINAL  Final    Comment:    ==================================================================== TOXASSURE SELECT 13 (MW) ==================================================================== Test                             Result       Flag       Units Drug Present and Declared for Prescription Verification   Tramadol                       PRESENT      EXPECTED   O-Desmethyltramadol            PRESENT      EXPECTED   N-Desmethyltramadol            PRESENT      EXPECTED    Source of tramadol is a prescription medication.    O-desmethyltramadol and N-desmethyltramadol are expected    metabolites of tramadol. ==================================================================== Test                      Result    Flag   Units      Ref Range   Creatinine              75               mg/dL      >=20 ==================================================================== Declared Medications:  The flagging and interpretation on this report are based on the  following declared medications.  Unexpected results may arise from  inaccuracies in the declared medications.  **Note: The testing scope of this panel includes these medications:  Tramadol (Ultram)  **Note: The testing scope of this panel does not include following  reported medications:  Amlodipine (Norvasc)  Hydrochlorothiazide (Prinzide)  Iron (Ferrous Sulfate)  Lisinopril (Prinzide)  Pantoprazole (Protonix)  Sildenafil (Viagra)  Simvastatin  (Zocor)  Turmeric  Vitamin D3 ==================================================================== For clinical consultation, please call [phone] ====================================================================    UDS interpretation: Compliant          Medication Assessment Form: Reviewed. Patient indicates being compliant with therapy Treatment compliance: Compliant Risk Assessment Profile: Aberrant behavior: See prior evaluations. None observed or detected today Comorbid factors increasing risk of overdose: See prior notes. No additional risks detected today Risk of substance use disorder (SUD): Low Opioid Risk Tool (ORT) Total Score: 0  Interpretation Table:  Score <3 = Low Risk for SUD  Score between 4-7 = Moderate Risk for SUD  Score >8 = High Risk for Opioid Abuse   Risk Mitigation Strategies:  Patient Counseling: Covered Patient-Prescriber Agreement (PPA): Present and active  Notification to other healthcare providers: Done  Pharmacologic Plan: No change in therapy, at this time  Post-Procedure Assessment  Visit date not found Procedure:10/31/16 Pre-procedure pain score:  4/10 Post-procedure pain score: 0/10         Influential Factors: BMI: 24.41 kg/m Intra-procedural challenges: None observed Assessment challenges: None detected         Post-procedural adverse reactions or complications: None reported Reported side-effects: None  Sedation: Please see nurses note. When no sedatives are used, the analgesic  levels obtained are directly associated to the effectiveness of the local anesthetics. However, when sedation is provided, the level of analgesia obtained during the initial 1 hour following the intervention, is believed to be the result of a combination of factors. These factors may include, but are not limited to: 1. The effectiveness of the local anesthetics used. 2. The effects of the analgesic(s) and/or anxiolytic(s) used. 3. The degree of  discomfort experienced by the patient at the time of the procedure. 4. The patients ability and reliability in recalling and recording the events. 5. The presence and influence of possible secondary gains and/or psychosocial factors. Reported result: Relief experienced during the 1st hour after the procedure: 50 % (Ultra-Short Term Relief) Interpretative annotation: Analgesia during this period is likely to be Local Anesthetic and/or IV Sedative (Analgesic/Anxiolitic) related.          Effects of local anesthetic: The analgesic effects attained during this period are directly associated to the localized infiltration of local anesthetics and therefore cary significant diagnostic value as to the etiological location, or anatomical origin, of the pain. Expected duration of relief is directly dependent on the pharmacodynamics of the local anesthetic used. Long-acting (4-6 hours) anesthetics used.  Reported result: Relief during the next 4 to 6 hour after the procedure: 70 % (Short-Term Relief) Interpretative annotation: Complete relief would suggest area to be the source of the pain.          Long-term benefit: Defined as the period of time past the expected duration of local anesthetics. With the possible exception of prolonged sympathetic blockade from the local anesthetics, benefits during this period are typically attributed to, or associated with, other factors such as analgesic sensory neuropraxia, antiinflammatory effects, or beneficial biochemical changes provided by agents other than the local anesthetics Reported result: Extended relief following procedure: 60 % (Long-Term Relief) Interpretative annotation: Good relief. This could suggest inflammation to be a significant component in the etiology to the pain.          Current benefits: Defined as persistent relief that continues at this point in time.   Reported results: Treated area: <50 %       Interpretative annotation: Ongoing benefits  would suggest effective therapeutic approach  Interpretation: Results would suggest a successful diagnostic intervention.          Laboratory Chemistry  Inflammation Markers Lab Results  Component Value Date   CRP 0.6 12/26/2015   ESRSEDRATE 3 12/26/2015   (CRP: Acute Phase) (ESR: Chronic Phase) Renal Function Markers Lab Results  Component Value Date   BUN 9 01/04/2016   CREATININE 1.07 01/04/2016   GFRAA >60 01/04/2016   GFRNONAA >60 01/04/2016   Hepatic Function Markers Lab Results  Component Value Date   AST 18 01/02/2016   ALT 13 (L) 01/02/2016   ALBUMIN 3.7 01/02/2016   ALKPHOS 48 01/02/2016   Electrolytes Lab Results  Component Value Date   NA 139 01/04/2016   K 3.4 (L) 01/04/2016   CL 113 (H) 01/04/2016   CALCIUM 8.1 (L) 01/04/2016   MG 1.9 01/04/2016   Neuropathy Markers Lab Results  Component Value Date   VITAMINB12 613 12/26/2015   Bone Pathology Markers Lab Results  Component Value Date   ALKPHOS 48 01/02/2016   VD25OH 24.0 (L) 12/26/2015   CALCIUM 8.1 (L) 01/04/2016   Coagulation Parameters Lab Results  Component Value Date   INR 1.24 01/03/2016   LABPROT 15.8 (H) 01/03/2016   PLT 145 (L) 01/04/2016   Cardiovascular  Markers Lab Results  Component Value Date   HGB 7.4 (L) 01/04/2016   HCT 21.7 (L) 01/04/2016   Note: Lab results reviewed.  Recent Diagnostic Imaging Review  Dg C-arm 1-60 Min-no Report  Result Date: 10/27/2016 There is no Radiologist interpretation  for this exam.  Note: Imaging results reviewed.          Meds  The patient has a current medication list which includes the following prescription(s): amlodipine, lisinopril-hydrochlorothiazide, pantoprazole, simvastatin, viagra, and tramadol.  Current Outpatient Prescriptions on File Prior to Visit  Medication Sig  . amLODipine (NORVASC) 2.5 MG tablet Take 2.5 mg by mouth daily.   Marland Kitchen lisinopril-hydrochlorothiazide (PRINZIDE,ZESTORETIC) 20-12.5 MG tablet Take 1 tablet by  mouth daily.   . pantoprazole (PROTONIX) 40 MG tablet Take 1 tablet (40 mg total) by mouth 2 (two) times daily before a meal.  . simvastatin (ZOCOR) 40 MG tablet Take 40 mg by mouth daily at 6 PM.   . VIAGRA 100 MG tablet Take 100 mg by mouth as needed.    No current facility-administered medications on file prior to visit.    ROS  Constitutional: Denies any fever or chills Gastrointestinal: No reported hemesis, hematochezia, vomiting, or acute GI distress Musculoskeletal: Denies any acute onset joint swelling, redness, loss of ROM, or weakness Neurological: No reported episodes of acute onset apraxia, aphasia, dysarthria, agnosia, amnesia, paralysis, loss of coordination, or loss of consciousness  Allergies  Mr. Pfiester has No Known Allergies.  PFSH  Drug: Mr. Plasencia  reports that he does not use drugs. Alcohol:  reports that he does not drink alcohol. Tobacco:  reports that he has quit smoking. He has never used smokeless tobacco. Medical:  has a past medical history of Back pain; Blood transfusion without reported diagnosis (12/2015); Hypertension; and Ulcer, stomach peptic, chronic. Family: family history includes Diabetes in his mother; Heart disease in his father.  Past Surgical History:  Procedure Laterality Date  . ESOPHAGOGASTRODUODENOSCOPY (EGD) WITH PROPOFOL N/A 01/03/2016   Procedure: ESOPHAGOGASTRODUODENOSCOPY (EGD) WITH PROPOFOL;  Surgeon: Manya Silvas, MD;  Location: [facility] ENDOSCOPY;  Service: Endoscopy;  Laterality: N/A;   Constitutional Exam  General appearance: Well nourished, well developed, and well hydrated. In no apparent acute distress Vitals:   03/24/17 0847  BP: 137/74  Pulse: (!) 103  Resp: 14  Temp: 98.4 F (36.9 C)  TempSrc: Oral  SpO2: 100%  Weight: 180 lb (81.6 kg)  Height: 6' (1.829 m)   BMI Assessment: Estimated body mass index is 24.41 kg/m as calculated from the following:   Height as of this encounter: 6' (1.829 m).   Weight as of this  encounter: 180 lb (81.6 kg).  BMI interpretation table: BMI level Category Range association with higher incidence of chronic pain  <18 kg/m2 Underweight   18.5-24.9 kg/m2 Ideal body weight   25-29.9 kg/m2 Overweight Increased incidence by 20%  30-34.9 kg/m2 Obese (Class I) Increased incidence by 68%  35-39.9 kg/m2 Severe obesity (Class II) Increased incidence by 136%  >40 kg/m2 Extreme obesity (Class III) Increased incidence by 254%   BMI Readings from Last 4 Encounters:  03/24/17 24.41 kg/m  10/27/16 25.09 kg/m  09/09/16 25.50 kg/m  03/22/16 25.09 kg/m   Wt Readings from Last 4 Encounters:  03/24/17 180 lb (81.6 kg)  10/27/16 185 lb (83.9 kg)  09/09/16 188 lb (85.3 kg)  03/22/16 185 lb (83.9 kg)  Psych/Mental status: Alert, oriented x 3 (person, place, & time)       Eyes: PERLA  Respiratory: No evidence of acute respiratory distress  Cervical Spine Exam  Inspection: No masses, redness, or swelling Alignment: Symmetrical Functional ROM: Unrestricted ROM      Stability: No instability detected Muscle strength & Tone: Functionally intact Sensory: Unimpaired Palpation: No palpable anomalies              Upper Extremity (UE) Exam    Side: Right upper extremity  Side: Left upper extremity  Inspection: No masses, redness, swelling, or asymmetry. No contractures  Inspection: No masses, redness, swelling, or asymmetry. No contractures  Functional ROM: Unrestricted ROM          Functional ROM: Unrestricted ROM          Muscle strength & Tone: Functionally intact  Muscle strength & Tone: Functionally intact  Sensory: Unimpaired  Sensory: Unimpaired  Palpation: No palpable anomalies              Palpation: No palpable anomalies              Specialized Test(s): Deferred         Specialized Test(s): Deferred          Thoracic Spine Exam  Inspection: No masses, redness, or swelling Alignment: Symmetrical Functional ROM: Unrestricted ROM Stability: No instability  detected Sensory: Unimpaired Muscle strength & Tone: No palpable anomalies  Lumbar Spine Exam  Inspection: No masses, redness, or swelling Alignment: Symmetrical Functional ROM: Unrestricted ROM      Stability: No instability detected Muscle strength & Tone: Functionally intact Sensory: Unimpaired Palpation: Complains of area being tender to palpation       Provocative Tests: Lumbar Hyperextension and rotation test: Positive bilaterally for facet joint pain. Patrick's Maneuver: evaluation deferred today                    Gait & Posture Assessment  Ambulation: Unassisted Gait: Relatively normal for age and body habitus Posture: WNL   Lower Extremity Exam    Side: Right lower extremity  Side: Left lower extremity  Inspection: No masses, redness, swelling, or asymmetry. No contractures  Inspection: No masses, redness, swelling, or asymmetry. No contractures  Functional ROM: Unrestricted ROM          Functional ROM: Unrestricted ROM          Muscle strength & Tone: Functionally intact  Muscle strength & Tone: Functionally intact  Sensory: Unimpaired  Sensory: Unimpaired  Palpation: No palpable anomalies  Palpation: No palpable anomalies   Assessment  Primary Diagnosis & Pertinent Problem List: The primary encounter diagnosis was Chronic low back pain (Location of Primary Source of Pain) (Bilateral) (R>L). Diagnoses of Lumbar spondylosis, Lumbar facet syndrome (Location of Primary Source of Pain) (Bilateral) (R>L), Chronic pain syndrome, and Long term current use of opiate analgesic were also pertinent to this visit.  Status Diagnosis  Controlled Controlled Controlled 1. Chronic low back pain (Location of Primary Source of Pain) (Bilateral) (R>L)   2. Lumbar spondylosis   3. Lumbar facet syndrome (Location of Primary Source of Pain) (Bilateral) (R>L)   4. Chronic pain syndrome   5. 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 traMADol (ULTRAM) 50 MG tablet    Sig: Take 2 tablets (100 mg total) by mouth every 6 (six) hours as needed for severe pain.    Dispense:  240 tablet    Refill:  5    Do not add this medication to the electronic "Automatic Refill" notification system. Patient may have prescription filled one day early if pharmacy is closed on scheduled refill date. Do not fill until: 03/24/17 To last until: 09/22/17    Order Specific Question:   Supervising Provider    Answer:   Milinda Pointer [phone]   New Prescriptions   No medications on file   Medications administered today: Mr. Tory had no medications administered during this visit. Lab-work, procedure(s), and/or referral(s): Orders Placed This Encounter  Procedures  . ToxASSURE Select 13 (MW), Urine   Imaging and/or referral(s): None  Interventional therapies: Planned, scheduled, and/or pending:   Not at this time.Instruct the patient is constipation continues or increases to contact us for daily medication management    Considering:   Diagnostic bilateral lumbar facet block Possible bilateral lumbar facet  radiofrequency ablation. Diagnostic bilateral L4-5 transforaminal epidural steroid injection   Palliative PRN treatment(s):   Diagnostic bilateral lumbar facet block   Diagnostic bilateral L4-5 transforaminal epidural steroid injection    Provider-requested follow-up: Return in about 6 months (around 09/23/2017) for MedMgmt.  Future Appointments Date Time Provider [place]  09/20/2017 8:30 AM Vevelyn Francois, NP [facility] None   Primary Care Physician: [facility], [place] Location: [facility] Note by: Vevelyn Francois NP Date: 03/24/2017; Time: 3:06 PM  Pain Score Disclaimer: We use the NRS-11 scale. This is a self-reported, subjective measurement of pain severity with only modest accuracy. It is used primarily to identify changes within a particular patient. It must be understood that outpatient pain scales are significantly less accurate that those used for research, where they can be applied under ideal controlled circumstances with minimal exposure to variables. In reality, the score is likely to be a combination of pain intensity and pain affect, where pain affect describes the degree of emotional arousal or changes in action readiness caused by the sensory experience of pain. Factors such as social and work situation, setting, emotional state, anxiety levels, expectation, and prior pain experience may influence pain perception and show large inter-individual differences that may also be affected by time variables.  Patient instructions provided during this appointment: Patient Instructions   Meds  The patient '@CMEDP' @  Current Outpatient Prescriptions on File Prior to Visit  Medication Sig  . amLODipine (NORVASC) 2.5 MG tablet Take 2.5 mg by mouth daily.   Marland Kitchen lisinopril-hydrochlorothiazide (PRINZIDE,ZESTORETIC) 20-12.5 MG tablet Take 1 tablet by mouth daily.   . pantoprazole (PROTONIX) 40 MG tablet Take 1 tablet (40 mg total) by mouth  2 (two) times daily before a meal.  . simvastatin (ZOCOR) 40 MG tablet Take 40 mg by mouth daily at 6 PM.   . VIAGRA 100 MG tablet Take 100 mg by mouth as  needed.   . traMADol (ULTRAM) 50 MG tablet Take 2 tablets (100 mg total) by mouth every 6 (six) hours as needed for severe pain.   No current facility-administered medications on file prior to visit.    You were given one prescription for Tramadol to last 6 months____________________________________________________________________________________________  Pain Scale  Introduction: The pain score used by this practice is the Verbal Numerical Rating Scale (VNRS-11). This is an 11-point scale. It is for adults and children 10 years or older. There are significant differences in how the pain score is reported, used, and applied. Forget everything you learned in the past and learn this scoring system.  General Information: The scale should reflect your current level of pain. Unless you are specifically asked for the level of your worst pain, or your average pain. If you are asked for one of these two, then it should be understood that it is over the past 24 hours.  Basic Activities of Daily Living (ADL): Personal hygiene, dressing, eating, transferring, and using restroom.  Instructions: Most patients tend to report their level of pain as a combination of two factors, their physical pain and their psychosocial pain. This last one is also known as "suffering" and it is reflection of how physical pain affects you socially and psychologically. From now on, report them separately. From this point on, when asked to report your pain level, report only your physical pain. Use the following table for reference.  Pain Clinic Pain Levels (0-5/10)  Pain Level Score  Description  No Pain 0   Mild pain 1 Nagging, annoying, but does not interfere with basic activities of daily living (ADL). Patients are able to eat, bathe, get dressed, toileting (being able to  get on and off the toilet and perform personal hygiene functions), transfer (move in and out of bed or a chair without assistance), and maintain continence (able to control bladder and bowel functions). Blood pressure and heart rate are unaffected. A normal heart rate for a healthy adult ranges from 60 to 100 bpm (beats per minute).   Mild to moderate pain 2 Noticeable and distracting. Impossible to hide from other people. More frequent flare-ups. Still possible to adapt and function close to normal. It can be very annoying and may have occasional stronger flare-ups. With discipline, patients may get used to it and adapt.   Moderate pain 3 Interferes significantly with activities of daily living (ADL). It becomes difficult to feed, bathe, get dressed, get on and off the toilet or to perform personal hygiene functions. Difficult to get in and out of bed or a chair without assistance. Very distracting. With effort, it can be ignored when deeply involved in activities.   Moderately severe pain 4 Impossible to ignore for more than a few minutes. With effort, patients may still be able to manage work or participate in some social activities. Very difficult to concentrate. Signs of autonomic nervous system discharge are evident: dilated pupils (mydriasis); mild sweating (diaphoresis); sleep interference. Heart rate becomes elevated (>115 bpm). Diastolic blood pressure (lower number) rises above 100 mmHg. Patients find relief in laying down and not moving.   Severe pain 5 Intense and extremely unpleasant. Associated with frowning face and frequent crying. Pain overwhelms the senses.  Ability to do any activity or maintain social relationships becomes significantly limited. Conversation becomes difficult. Pacing back and forth is common, as getting into a comfortable position is nearly impossible. Pain wakes you up from deep sleep. Physical signs will be obvious: pupillary  dilation; increased sweating; goosebumps;  brisk reflexes; cold, clammy hands and feet; nausea, vomiting or dry heaves; loss of appetite; significant sleep disturbance with inability to fall asleep or to remain asleep. When persistent, significant weight loss is observed due to the complete loss of appetite and sleep deprivation.  Blood pressure and heart rate becomes significantly elevated. Caution: If elevated blood pressure triggers a pounding headache associated with blurred vision, then the patient should immediately seek attention at an urgent or emergency care unit, as these may be signs of an impending stroke.    Emergency Department Pain Levels (6-10/10)  Emergency Room Pain 6 Severely limiting. Requires emergency care and should not be seen or managed at an outpatient pain management facility. Communication becomes difficult and requires great effort. Assistance to reach the emergency department may be required. Facial flushing and profuse sweating along with potentially dangerous increases in heart rate and blood pressure will be evident.   Distressing pain 7 Self-care is very difficult. Assistance is required to transport, or use restroom. Assistance to reach the emergency department will be required. Tasks requiring coordination, such as bathing and getting dressed become very difficult.   Disabling pain 8 Self-care is no longer possible. At this level, pain is disabling. The individual is unable to do even the most "basic" activities such as walking, eating, bathing, dressing, transferring to a bed, or toileting. Fine motor skills are lost. It is difficult to think clearly.   Incapacitating pain 9 Pain becomes incapacitating. Thought processing is no longer possible. Difficult to remember your own name. Control of movement and coordination are lost.   The worst pain imaginable 10 At this level, most patients pass out from pain. When this level is reached, collapse of the autonomic nervous system occurs, leading to a sudden drop in  blood pressure and heart rate. This in turn results in a temporary and dramatic drop in blood flow to the brain, leading to a loss of consciousness. Fainting is one of the body's self defense mechanisms. Passing out puts the brain in a calmed state and causes it to shut down for a while, in order to begin the healing process.    Summary: 1. Refer to this scale when providing [place] with your pain level. 2. Be accurate and careful when reporting your pain level. This will help with your care. 3. Over-reporting your pain level will lead to loss of credibility. 4. Even a level of 1/10 means that there is pain and will be treated at our facility. 5. High, inaccurate reporting will be documented as "Symptom Exaggeration", leading to loss of credibility and suspicions of possible secondary gains such as obtaining more narcotics, or wanting to appear disabled, for fraudulent reasons. 6. Only pain levels of 5 or below will be seen at our facility. 7. Pain levels of 6 and above will be sent to the Emergency Department and the appointment cancelled. ____________________________________________________________________________________________

## 2017-03-24 NOTE — Progress Notes (Deleted)
Patient's Name: Caleb Edwards  MRN: [id number]  Referring Provider: Inc, [place]: [birth year]  PCP: Inc, [place]: 03/24/2017  Note by: Vevelyn Francois NP  Service setting: Ambulatory outpatient  Specialty: Interventional Pain Management  Location: [facility] (AMB) Pain Management Facility    Patient type: Established    Primary Reason(s) for Visit: Encounter for prescription drug management (Level of risk: moderate) CC: Back Pain (lower)  HPI  Caleb Edwards is a 66 y.o. year old, male patient, who comes today for a medication management evaluation. 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 His primarily concern today is the Back Pain (lower)  Pain Assessment: Self-Reported Pain Score: 6 /10 Clinically the patient looks like a 2/10 Reported level is inconsistent with clinical observations. Information on the proper use of the pain scale provided to the patient today Pain Type: Chronic pain Pain Location: Back Pain Orientation: Lower Pain Descriptors / Indicators: Burning, Tingling Pain Frequency: Intermittent  Caleb Edwards was last scheduled for an appointment on 09/09/16 for medication management. During today's appointment we reviewed Caleb Edwards chronic pain status, as well as his  outpatient medication regimen. He has chronic low back pain. He has radicular symptoms that do down into the back his right leg into his thigh. He has numbness and tingling denies weakness. He admits that he has occasional constipation. He uses over-the-counter medication along with diet changes which are currently effective.  The patient  reports that he does not use drugs. His body mass index is 24.41 kg/m.   Further details on both, my assessment(s), as well as the proposed treatment plan, please see below.  Controlled Substance Pharmacotherapy Assessment REMS (Risk Evaluation and Mitigation Strategy)  Analgesic:Tramadol 50 mg 2 tablets every 6 hours (400 mg/day) MME/day:40 mg/day Landis Martins, RN  03/24/2017  8:52 AM  Sign at close encounter Nursing Pain Medication Assessment:  Safety precautions to be maintained throughout the outpatient stay will include: orient to surroundings, keep bed in low position, maintain call bell within reach at all times, provide assistance with transfer out of bed and ambulation.  Medication Inspection Compliance: Caleb Edwards did not comply with our request to bring his pills to be counted. He was reminded that bringing the medication bottles, even when empty, is a requirement.  Medication: None brought in. Pill/Patch Count: None available to be counted. Bottle Appearance: No container available. Did not bring bottle(s) to appointment. Filled Date: N/A Last Medication intake:  Today   Pharmacokinetics: Liberation and absorption (onset of action): WNL Distribution (time to peak effect): WNL Metabolism and excretion (duration of action): WNL         Pharmacodynamics: Desired effects: Analgesia: Caleb Edwards reports >50% benefit. Functional ability: Patient reports that medication allows him to accomplish basic ADLs Clinically meaningful improvement in function (CMIF): Sustained CMIF goals met Perceived effectiveness: Described as relatively effective,  allowing for increase in activities of daily living (ADL)  Undesirable effects: Side-effects or Adverse reactions: None reported Monitoring: [place] PMP: Online review of the past 13-monthperiod conducted. Compliant with practice rules and regulations List of all UDS test(s) done:  Lab Results  Component Value Date   TOXASSSELUR FINAL 09/09/2016   TOXASSSELUR FINAL 12/22/2015   TOXASSSELUR FINAL 09/24/2015   Last UDS on record: ToxAssure Select 13  Date Value Ref Range Status  09/09/2016 FINAL  Final    Comment:    ==================================================================== TOXASSURE SELECT 13 (MW) ==================================================================== Test                             Result       Flag       Units Drug Present and Declared for Prescription Verification   Tramadol                       PRESENT      EXPECTED   O-Desmethyltramadol            PRESENT      EXPECTED   N-Desmethyltramadol            PRESENT      EXPECTED    Source of tramadol is a prescription medication.    O-desmethyltramadol and N-desmethyltramadol are expected    metabolites of tramadol. ==================================================================== Test                      Result    Flag   Units      Ref Range   Creatinine              75               mg/dL      >=20 ==================================================================== Declared Medications:  The flagging and interpretation on this report are based on the  following declared medications.  Unexpected results may arise from  inaccuracies in the declared medications.  **Note: The testing scope of this panel includes these medications:  Tramadol (Ultram)  **Note: The testing scope of this panel does not include following  reported medications:  Amlodipine (Norvasc)  Hydrochlorothiazide (Prinzide)  Iron (Ferrous Sulfate)  Lisinopril (Prinzide)  Pantoprazole (Protonix)  Sildenafil (Viagra)  Simvastatin  (Zocor)  Turmeric  Vitamin D3 ==================================================================== For clinical consultation, please call ([phone] ====================================================================    UDS interpretation: Compliant          Medication Assessment Form: Reviewed. Patient indicates being compliant with therapy Treatment compliance: Compliant Risk Assessment Profile: Aberrant behavior: See prior evaluations. None observed or detected today Comorbid factors increasing risk of overdose: See prior notes. No additional risks detected today Risk of substance use disorder (SUD): Low Opioid Risk Tool (ORT) Total Score: 0  Interpretation Table:  Score <3 = Low Risk for SUD  Score between 4-7 = Moderate Risk for SUD  Score >8 = High Risk for Opioid Abuse   Risk Mitigation Strategies:  Patient Counseling: Covered Patient-Prescriber Agreement (PPA): Present and active  Notification to other healthcare providers: Done  Pharmacologic Plan: No change in therapy, at this time  Laboratory Chemistry  Inflammation Markers Lab Results  Component Value Date   CRP 0.6 12/26/2015   ESRSEDRATE 3 12/26/2015   (CRP: Acute Phase) (ESR: Chronic Phase) Renal Function Markers Lab Results  Component Value Date   BUN 9 01/04/2016   CREATININE 1.07 01/04/2016   GFRAA >60 01/04/2016   GFRNONAA >60 01/04/2016   Hepatic  Function Markers Lab Results  Component Value Date   AST 18 01/02/2016   ALT 13 (L) 01/02/2016   ALBUMIN 3.7 01/02/2016   ALKPHOS 48 01/02/2016   Electrolytes Lab Results  Component Value Date   NA 139 01/04/2016   K 3.4 (L) 01/04/2016   CL 113 (H) 01/04/2016   CALCIUM 8.1 (L) 01/04/2016   MG 1.9 01/04/2016   Neuropathy Markers Lab Results  Component Value Date   VITAMINB12 613 12/26/2015   Bone Pathology Markers Lab Results  Component Value Date   ALKPHOS 48 01/02/2016   VD25OH 24.0 (L) 12/26/2015   CALCIUM 8.1 (L) 01/04/2016    Coagulation Parameters Lab Results  Component Value Date   INR 1.24 01/03/2016   LABPROT 15.8 (H) 01/03/2016   PLT 145 (L) 01/04/2016   Cardiovascular Markers Lab Results  Component Value Date   HGB 7.4 (L) 01/04/2016   HCT 21.7 (L) 01/04/2016   Note: Lab results reviewed.  Recent Diagnostic Imaging Review  Dg C-arm 1-60 Min-no Report  Result Date: 10/27/2016 There is no Radiologist interpretation  for this exam.  Note: Imaging results reviewed.          Meds  The patient has a current medication list which includes the following prescription(s): amlodipine, lisinopril-hydrochlorothiazide, pantoprazole, simvastatin, viagra, and tramadol.  Current Outpatient Prescriptions on File Prior to Visit  Medication Sig  . amLODipine (NORVASC) 2.5 MG tablet Take 2.5 mg by mouth daily.   Marland Kitchen lisinopril-hydrochlorothiazide (PRINZIDE,ZESTORETIC) 20-12.5 MG tablet Take 1 tablet by mouth daily.   . pantoprazole (PROTONIX) 40 MG tablet Take 1 tablet (40 mg total) by mouth 2 (two) times daily before a meal.  . simvastatin (ZOCOR) 40 MG tablet Take 40 mg by mouth daily at 6 PM.   . VIAGRA 100 MG tablet Take 100 mg by mouth as needed.   . traMADol (ULTRAM) 50 MG tablet Take 2 tablets (100 mg total) by mouth every 6 (six) hours as needed for severe pain.   No current facility-administered medications on file prior to visit.    ROS  Constitutional: Denies any fever or chills Gastrointestinal: No reported hemesis, hematochezia, vomiting, or acute GI distress Musculoskeletal: Denies any acute onset joint swelling, redness, loss of ROM, or weakness Neurological: No reported episodes of acute onset apraxia, aphasia, dysarthria, agnosia, amnesia, paralysis, loss of coordination, or loss of consciousness  Allergies  Caleb Edwards has No Known Allergies.  PFSH  Drug: Caleb Edwards  reports that he does not use drugs. Alcohol:  reports that he does not drink alcohol. Tobacco:  reports that he has quit  smoking. He has never used smokeless tobacco. Medical:  has a past medical history of Back pain; Blood transfusion without reported diagnosis (12/2015); Hypertension; and Ulcer, stomach peptic, chronic. Family: family history includes Diabetes in his mother; Heart disease in his father.  Past Surgical History:  Procedure Laterality Date  . ESOPHAGOGASTRODUODENOSCOPY (EGD) WITH PROPOFOL N/A 01/03/2016   Procedure: ESOPHAGOGASTRODUODENOSCOPY (EGD) WITH PROPOFOL;  Surgeon: Manya Silvas, MD;  Location[facility] ENDOSCOPY;  Service: Endoscopy;  Laterality: N/A;   Constitutional Exam  General appearance: Well nourished, well developed, and well hydrated. In no apparent acute distress Vitals:   03/24/17 0847  BP: 137/74  Pulse: (!) 103  Resp: 14  Temp: 98.4 F (36.9 C)  TempSrc: Oral  SpO2: 100%  Weight: 180 lb (81.6 kg)  Height: 6' (1.829 m)   BMI Assessment: Estimated body mass index is 24.41 kg/m as calculated from the following:  Height as of this encounter: 6' (1.829 m).   Weight as of this encounter: 180 lb (81.6 kg).  BMI interpretation table: BMI level Category Range association with higher incidence of chronic pain  <18 kg/m2 Underweight   18.5-24.9 kg/m2 Ideal body weight   25-29.9 kg/m2 Overweight Increased incidence by 20%  30-34.9 kg/m2 Obese (Class I) Increased incidence by 68%  35-39.9 kg/m2 Severe obesity (Class II) Increased incidence by 136%  >40 kg/m2 Extreme obesity (Class III) Increased incidence by 254%   BMI Readings from Last 4 Encounters:  03/24/17 24.41 kg/m  10/27/16 25.09 kg/m  09/09/16 25.50 kg/m  03/22/16 25.09 kg/m   Wt Readings from Last 4 Encounters:  03/24/17 180 lb (81.6 kg)  10/27/16 185 lb (83.9 kg)  09/09/16 188 lb (85.3 kg)  03/22/16 185 lb (83.9 kg)  Psych/Mental status: Alert, oriented x 3 (person, place, & time)       Eyes: PERLA Respiratory: No evidence of acute respiratory distress  Cervical Spine Exam  Inspection: No  masses, redness, or swelling Alignment: Symmetrical Functional ROM: Unrestricted ROM      Stability: No instability detected Muscle strength & Tone: Functionally intact Sensory: Unimpaired Palpation: No palpable anomalies              Upper Extremity (UE) Exam    Side: Right upper extremity  Side: Left upper extremity  Inspection: No masses, redness, swelling, or asymmetry. No contractures  Inspection: No masses, redness, swelling, or asymmetry. No contractures  Functional ROM: Unrestricted ROM          Functional ROM: Unrestricted ROM          Muscle strength & Tone: Functionally intact  Muscle strength & Tone: Functionally intact  Sensory: Unimpaired  Sensory: Unimpaired  Palpation: No palpable anomalies              Palpation: No palpable anomalies              Specialized Test(s): Deferred         Specialized Test(s): Deferred          Thoracic Spine Exam  Inspection: No masses, redness, or swelling Alignment: Symmetrical Functional ROM: Unrestricted ROM Stability: No instability detected Sensory: Unimpaired Muscle strength & Tone: No palpable anomalies  Lumbar Spine Exam  Inspection: No masses, redness, or swelling Alignment: Symmetrical Functional ROM: Unrestricted ROM      Stability: No instability detected Muscle strength & Tone: Functionally intact Sensory: Unimpaired Palpation: Complains of area being tender to palpation       Provocative Tests: Lumbar Hyperextension and rotation test: Positive bilaterally for facet joint pain. Patrick's Maneuver: evaluation deferred today                    Gait & Posture Assessment  Ambulation: Unassisted Gait: Relatively normal for age and body habitus Posture: WNL   Lower Extremity Exam    Side: Right lower extremity  Side: Left lower extremity  Inspection: No masses, redness, swelling, or asymmetry. No contractures  Inspection: No masses, redness, swelling, or asymmetry. No contractures  Functional ROM: Unrestricted ROM           Functional ROM: Unrestricted ROM          Muscle strength & Tone: Functionally intact  Muscle strength & Tone: Functionally intact  Sensory: Unimpaired  Sensory: Unimpaired  Palpation: No palpable anomalies  Palpation: No palpable anomalies   Assessment  Primary Diagnosis & Pertinent Problem List: The primary encounter diagnosis was  Chronic low back pain (Location of Primary Source of Pain) (Bilateral) (R>L). Diagnoses of Lumbar spondylosis, Lumbar facet syndrome (Location of Primary Source of Pain) (Bilateral) (R>L), Chronic pain syndrome, and Long term current use of opiate analgesic were also pertinent to this visit.  Status Diagnosis  Controlled Controlled Controlled 1. Chronic low back pain (Location of Primary Source of Pain) (Bilateral) (R>L)   2. Lumbar spondylosis   3. Lumbar facet syndrome (Location of Primary Source of Pain) (Bilateral) (R>L)   4. Chronic pain syndrome   5. 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  New Prescriptions   No medications on file   Medications  administered today: Caleb Edwards had no medications administered during this visit. Lab-work, procedure(s), and/or referral(s): No orders of the defined types were placed in this encounter.  Imaging and/or referral(s): None  Interventional therapies: Planned, scheduled, and/or pending:   Not at this time.Instruct the patient is constipation continues or increases to contact us for daily medication management    Considering:   Diagnostic bilateral lumbar facet block Possible bilateral lumbar facet radiofrequency ablation. Diagnostic bilateral L4-5 transforaminal epidural steroid injection   Palliative PRN treatment(s):   Diagnostic bilateral lumbar facet block   Diagnostic bilateral L4-5 transforaminal epidural steroid injection    Provider-requested follow-up: Return in about 6 months (around 09/23/2017) for MedMgmt.  No future appointments. Primary Care Physician: Inc, [place] Location: [facility] Note by: Vevelyn Francois NP Date: 03/24/2017; Time: 9:16 AM  Pain Score Disclaimer: We use the NRS-11 scale. This is a self-reported, subjective measurement of pain severity with only modest accuracy. It is used primarily to identify changes within a particular patient. It must be understood that outpatient pain scales are significantly less accurate that those used for research, where they can be applied under ideal controlled circumstances with minimal exposure to variables. In reality, the score is likely to be a combination of pain intensity and pain affect, where pain affect describes the degree of emotional arousal or changes in action readiness caused by the sensory experience of pain. Factors such as social and work situation, setting, emotional state, anxiety levels, expectation, and prior pain experience may influence pain perception and show large inter-individual differences that may also be affected by time variables.  Patient  instructions provided during this appointment: Patient Instructions   Meds  The patient '@[email or web address]' @  Current Outpatient Prescriptions on File Prior to Visit  Medication Sig  . amLODipine (NORVASC) 2.5 MG tablet Take 2.5 mg by mouth daily.   Marland Kitchen lisinopril-hydrochlorothiazide (PRINZIDE,ZESTORETIC) 20-12.5 MG tablet Take 1 tablet by mouth daily.   . pantoprazole (PROTONIX) 40  MG tablet Take 1 tablet (40 mg total) by mouth 2 (two) times daily before a meal.  . simvastatin (ZOCOR) 40 MG tablet Take 40 mg by mouth daily at 6 PM.   . VIAGRA 100 MG tablet Take 100 mg by mouth as needed.   . traMADol (ULTRAM) 50 MG tablet Take 2 tablets (100 mg total) by mouth every 6 (six) hours as needed for severe pain.   No current facility-administered medications on file prior to visit.

## 2017-03-30 LAB — TOXASSURE SELECT 13 (MW), URINE

## 2017-05-12 ENCOUNTER — Encounter: Payer: Self-pay | Admitting: Urology

## 2017-05-12 ENCOUNTER — Ambulatory Visit (INDEPENDENT_AMBULATORY_CARE_PROVIDER_SITE_OTHER): Payer: BLUE CROSS/BLUE SHIELD | Admitting: Urology

## 2017-05-12 VITALS — BP 149/78 | HR 73 | Ht 72.0 in | Wt 171.8 lb

## 2017-05-12 DIAGNOSIS — Z125 Encounter for screening for malignant neoplasm of prostate: Secondary | ICD-10-CM

## 2017-05-12 DIAGNOSIS — R310 Gross hematuria: Secondary | ICD-10-CM

## 2017-05-12 LAB — URINALYSIS, COMPLETE
BILIRUBIN UA: NEGATIVE
Glucose, UA: NEGATIVE
Ketones, UA: NEGATIVE
Nitrite, UA: NEGATIVE
PH UA: 6.5 (ref 5.0–7.5)
PROTEIN UA: NEGATIVE
SPEC GRAV UA: 1.01 (ref 1.005–1.030)
Urobilinogen, Ur: 0.2 mg/dL (ref 0.2–1.0)

## 2017-05-12 LAB — MICROSCOPIC EXAMINATION

## 2017-05-12 NOTE — Progress Notes (Signed)
05/12/2017 8:44 AM   Caleb SaunasWilliam J Edwards 08/23/1951 [id number]  Referring provider: [facility], [facility] [address] [place], [place] [id number]  Chief Complaint  Patient presents with  . New Patient (Initial Visit)    Penile bleed    HPI: The patient is a 66 year old gentleman who presents today for evaluation of gross hematuria.  1. Gross hematuria The patient experienced gross painless hematuria with small clots for the first time about 5 days ago. He has never experienced this prior to this time. He feels that a pain and a broken in his penis. There is no history of nephrolithiasis. No history of UTIs. Hematuria is resolving and his urine is now clear yellow.  2. Prostate cancer screening Unsure if he has had a PSA checked prior. His brother does have a history of prostate cancer the per the patient was successfully treated 6-7 years ago.     PMH: Past Medical History:  Diagnosis Date  . Back pain   . Blood transfusion without reported diagnosis 12/2015   bleeding ulcer hospitalization.   . Hypertension   . Ulcer, stomach peptic, chronic     Surgical History: Past Surgical History:  Procedure Laterality Date  . ESOPHAGOGASTRODUODENOSCOPY (EGD) WITH PROPOFOL N/A 01/03/2016   Procedure: ESOPHAGOGASTRODUODENOSCOPY (EGD) WITH PROPOFOL;  Surgeon: Scot Junobert T Elliott, MD;  Location: [facility] ENDOSCOPY;  Service: Endoscopy;  Laterality: N/A;    Home Medications:  Allergies as of 05/12/2017   No Known Allergies     Medication List       Accurate as of 05/12/17  8:44 AM. Always use your most recent med list.          amLODipine 2.5 MG tablet Commonly known as:  NORVASC Take 2.5 mg by mouth daily.   lisinopril-hydrochlorothiazide 20-12.5 MG tablet Commonly known as:  PRINZIDE,ZESTORETIC Take 1 tablet by mouth daily.   pantoprazole 40 MG tablet Commonly known as:  PROTONIX Take 1 tablet (40 mg total) by mouth 2 (two) times daily before a meal.   simvastatin 40 MG  tablet Commonly known as:  ZOCOR Take 40 mg by mouth daily at 6 PM.   traMADol 50 MG tablet Commonly known as:  ULTRAM Take 2 tablets (100 mg total) by mouth every 6 (six) hours as needed for severe pain.   VIAGRA 100 MG tablet Generic drug:  sildenafil Take 100 mg by mouth as needed.       Allergies: No Known Allergies  Family History: Family History  Problem Relation Age of Onset  . Diabetes Mother   . Heart disease Father   . Prostate cancer Brother   . Bladder Cancer Neg Hx   . Kidney cancer Neg Hx     Social History:  reports that he has quit smoking. He has never used smokeless tobacco. He reports that he does not drink alcohol or use drugs.  ROS: UROLOGY Frequent Urination?: No Hard to postpone urination?: No Burning/pain with urination?: Yes Get up at night to urinate?: No Leakage of urine?: Yes Urine stream starts and stops?: Yes Trouble starting stream?: Yes Do you have to strain to urinate?: No Blood in urine?: Yes Urinary tract infection?: No Sexually transmitted disease?: No Injury to kidneys or bladder?: No Painful intercourse?: Yes Weak stream?: Yes Erection problems?: Yes Penile pain?: Yes  Gastrointestinal Nausea?: No Vomiting?: No Indigestion/heartburn?: No Diarrhea?: No Constipation?: Yes  Constitutional Fever: No Night sweats?: No Weight loss?: Yes Fatigue?: Yes  Skin Skin rash/lesions?: No Itching?: Yes  Eyes Blurred vision?: Yes Double vision?: No  Ears/Nose/Throat Sore throat?: No Sinus problems?: No  Hematologic/Lymphatic Swollen glands?: No Easy bruising?: Yes  Cardiovascular Leg swelling?: Yes Chest pain?: No  Respiratory Cough?: No Shortness of breath?: No  Endocrine Excessive thirst?: No  Musculoskeletal Back pain?: Yes Joint pain?: No  Neurological Headaches?: No Dizziness?: No  Psychologic Depression?: No Anxiety?: No  Physical Exam: BP (!) 149/78 (BP Location: Left Arm, Patient  Position: Sitting, Cuff Size: Normal)   Pulse 73   Ht 6' (1.829 m)   Wt 171 lb 12.8 oz (77.9 kg)   BMI 23.30 kg/m   Constitutional:  Alert and oriented, No acute distress. HEENT:  AT, moist mucus membranes.  Trachea midline, no masses. Cardiovascular: No clubbing, cyanosis, or edema. Respiratory: Normal respiratory effort, no increased work of breathing. GI: Abdomen is soft, nontender, nondistended, no abdominal masses GU: No CVA tenderness. Normal phallus. Testicles descended bilaterally. Benign. DRE: 2+ smooth benign. Symmetric Skin: No rashes, bruises or suspicious lesions. Lymph: No cervical or inguinal adenopathy. Neurologic: Grossly intact, no focal deficits, moving all 4 extremities. Psychiatric: Normal mood and affect.  Laboratory Data: Lab Results  Component Value Date   WBC 6.5 01/04/2016   HGB 7.4 (L) 01/04/2016   HCT 21.7 (L) 01/04/2016   MCV 89.0 01/04/2016   PLT 145 (L) 01/04/2016    Lab Results  Component Value Date   CREATININE 1.07 01/04/2016    No results found for: PSA  No results found for: TESTOSTERONE  No results found for: HGBA1C  Urinalysis    Component Value Date/Time   COLORURINE YELLOW (A) 01/02/2016 1230   APPEARANCEUR HAZY (A) 01/02/2016 1230   LABSPEC 1.018 01/02/2016 1230   PHURINE 8.0 01/02/2016 1230   GLUCOSEU NEGATIVE 01/02/2016 1230   HGBUR NEGATIVE 01/02/2016 1230   BILIRUBINUR NEGATIVE 01/02/2016 1230   KETONESUR NEGATIVE 01/02/2016 1230   PROTEINUR NEGATIVE 01/02/2016 1230   NITRITE NEGATIVE 01/02/2016 1230   LEUKOCYTESUR NEGATIVE 01/02/2016 1230    Assessment & Plan:    1. Gross hematuria -Patient will undergo a CT hematuria protocol followed by office cystoscopy  2. Prostate cancer screening Due for PSA today   Return for after CT for cysto.  Hildred Laser, MD  [facility] [address] [place], [place] [phone]

## 2017-05-12 NOTE — Addendum Note (Signed)
Addended by: GARRISON, CARRIE M on: 05/12/2017 02:35 PM   Modules accepted: Orders  

## 2017-05-13 LAB — PSA: PROSTATE SPECIFIC AG, SERUM: 0.9 ng/mL (ref 0.0–4.0)

## 2017-05-15 LAB — CULTURE, URINE COMPREHENSIVE

## 2017-06-07 ENCOUNTER — Ambulatory Visit
Admission: RE | Admit: 2017-06-07 | Discharge: 2017-06-07 | Disposition: A | Discharge status: Home / Self Care | Payer: BLUE CROSS/BLUE SHIELD | Source: Ambulatory Visit | Attending: Urology | Admitting: Urology

## 2017-06-07 DIAGNOSIS — N200 Calculus of kidney: Secondary | ICD-10-CM | POA: Diagnosis not present

## 2017-06-07 DIAGNOSIS — I700 Atherosclerosis of aorta: Secondary | ICD-10-CM | POA: Diagnosis not present

## 2017-06-07 DIAGNOSIS — R310 Gross hematuria: Secondary | ICD-10-CM

## 2017-06-07 DIAGNOSIS — N400 Enlarged prostate without lower urinary tract symptoms: Secondary | ICD-10-CM | POA: Insufficient documentation

## 2017-06-07 DIAGNOSIS — K5730 Diverticulosis of large intestine without perforation or abscess without bleeding: Secondary | ICD-10-CM | POA: Insufficient documentation

## 2017-06-07 LAB — POCT I-STAT CREATININE: CREATININE: 1.4 mg/dL — AB (ref 0.61–1.24)

## 2017-06-07 MED ORDER — IOPAMIDOL (ISOVUE-370) INJECTION 76%
125.0000 mL | Freq: Once | INTRAVENOUS | Status: AC | PRN
  Administered 2017-06-07: 125 mL via INTRAVENOUS

## 2017-06-09 ENCOUNTER — Ambulatory Visit (INDEPENDENT_AMBULATORY_CARE_PROVIDER_SITE_OTHER): Payer: BLUE CROSS/BLUE SHIELD | Admitting: Urology

## 2017-06-09 ENCOUNTER — Encounter: Payer: Self-pay | Admitting: Urology

## 2017-06-09 VITALS — BP 150/76 | HR 68 | Ht 72.0 in | Wt 167.6 lb

## 2017-06-09 DIAGNOSIS — N200 Calculus of kidney: Secondary | ICD-10-CM

## 2017-06-09 DIAGNOSIS — R310 Gross hematuria: Secondary | ICD-10-CM | POA: Diagnosis not present

## 2017-06-09 LAB — URINALYSIS, COMPLETE
Bilirubin, UA: NEGATIVE
GLUCOSE, UA: NEGATIVE
KETONES UA: NEGATIVE
NITRITE UA: NEGATIVE
PROTEIN UA: NEGATIVE
RBC, UA: NEGATIVE
SPEC GRAV UA: 1.01 (ref 1.005–1.030)
UUROB: 0.2 mg/dL (ref 0.2–1.0)
pH, UA: 7 (ref 5.0–7.5)

## 2017-06-09 LAB — MICROSCOPIC EXAMINATION
Epithelial Cells (non renal): NONE SEEN /hpf (ref 0–10)
RBC, UA: NONE SEEN /hpf (ref 0–?)

## 2017-06-09 MED ORDER — CIPROFLOXACIN HCL 500 MG PO TABS
500.0000 mg | ORAL_TABLET | Freq: Once | ORAL | Status: AC
  Administered 2017-06-09: 500 mg via ORAL

## 2017-06-09 MED ORDER — LIDOCAINE HCL 2 % EX GEL
1.0000 "application " | Freq: Once | CUTANEOUS | Status: AC
  Administered 2017-06-09: 1 via URETHRAL

## 2017-06-09 NOTE — Progress Notes (Signed)
   06/09/17  CC:  Chief Complaint  Patient presents with  . Cysto    HPI: The patient is a 66 year old woman who presents today for completion of his gross hematuria workup.  1. Gross hematuria The patient experienced gross painless hematuria with small clots for the first time in August 2018. He has never experienced this prior to this time.   CT urogram revealed a 6 mm right lower pole stone, 5 mm left lower pole stone, and 6 mm left mid pole stone. No other source for hematuria identified on CT. Right stone visible on scout imaging. Left stones obstructing by large stool burden.  2. Prostate cancer screening PSA 0.9/DRE 2+ benign - August 2018  His brother does have a history of prostate cancer the per the patient was successfully treated 6-7 years ago.    There were no vitals taken for this visit. NED. A&Ox3.   No respiratory distress   Abd soft, NT, ND Normal phallus with bilateral descended testicles  Cystoscopy Procedure Note  Patient identification was confirmed, informed consent was obtained, and patient was prepped using Betadine solution.  Lidocaine jelly was administered per urethral meatus.    Preoperative abx where received prior to procedure.     Pre-Procedure: - Inspection reveals a normal caliber ureteral meatus.  Procedure: The flexible cystoscope was introduced without difficulty - Wide caliber bulbar urethra stricture passively dilated with scope - Enlarged prostate visually obstructive prostate with hypervascularity - Normal bladder neck - Bilateral ureteral orifices identified - Bladder mucosa  reveals no ulcers, tumors, or lesions - No bladder stones - No trabeculation  Retroflexion shows intravesical lobe noted  Post-Procedure: - Patient tolerated the procedure well  Assessment/ Plan:  1. Bilateral nonobstructing nephrolithiasis -Discussed treatment options including active surveillance, bilateral ureteroscopy, and staged lithotripsy.  Patient has elected active surveillance. He will follow-up in one year with a KUB prior.  2. Gross hematuria -Likely secondary to hypervascularity of prostate. If continues, can consider finasteride. Repeat urinalysis in one year.  3. Prostate cancer screening Up to date

## 2017-07-31 ENCOUNTER — Encounter: Payer: Self-pay | Admitting: Internal Medicine

## 2017-07-31 ENCOUNTER — Inpatient Hospital Stay
Admission: EM | Admit: 2017-07-31 | Discharge: 2017-08-02 | DRG: 378 | Disposition: A | Discharge status: Home / Self Care | Payer: BLUE CROSS/BLUE SHIELD | Attending: Internal Medicine | Admitting: Internal Medicine

## 2017-07-31 DIAGNOSIS — K315 Obstruction of duodenum: Secondary | ICD-10-CM | POA: Diagnosis present

## 2017-07-31 DIAGNOSIS — K449 Diaphragmatic hernia without obstruction or gangrene: Secondary | ICD-10-CM | POA: Diagnosis present

## 2017-07-31 DIAGNOSIS — N390 Urinary tract infection, site not specified: Secondary | ICD-10-CM | POA: Diagnosis present

## 2017-07-31 DIAGNOSIS — R109 Unspecified abdominal pain: Secondary | ICD-10-CM

## 2017-07-31 DIAGNOSIS — K5900 Constipation, unspecified: Secondary | ICD-10-CM | POA: Diagnosis present

## 2017-07-31 DIAGNOSIS — Z87891 Personal history of nicotine dependence: Secondary | ICD-10-CM

## 2017-07-31 DIAGNOSIS — D500 Iron deficiency anemia secondary to blood loss (chronic): Secondary | ICD-10-CM | POA: Diagnosis present

## 2017-07-31 DIAGNOSIS — K5731 Diverticulosis of large intestine without perforation or abscess with bleeding: Principal | ICD-10-CM | POA: Diagnosis present

## 2017-07-31 DIAGNOSIS — K921 Melena: Secondary | ICD-10-CM

## 2017-07-31 DIAGNOSIS — Z8711 Personal history of peptic ulcer disease: Secondary | ICD-10-CM

## 2017-07-31 DIAGNOSIS — D649 Anemia, unspecified: Secondary | ICD-10-CM

## 2017-07-31 DIAGNOSIS — D62 Acute posthemorrhagic anemia: Secondary | ICD-10-CM | POA: Diagnosis present

## 2017-07-31 DIAGNOSIS — K922 Gastrointestinal hemorrhage, unspecified: Secondary | ICD-10-CM

## 2017-07-31 DIAGNOSIS — Z79899 Other long term (current) drug therapy: Secondary | ICD-10-CM | POA: Diagnosis not present

## 2017-07-31 DIAGNOSIS — I10 Essential (primary) hypertension: Secondary | ICD-10-CM | POA: Diagnosis present

## 2017-07-31 DIAGNOSIS — R55 Syncope and collapse: Secondary | ICD-10-CM

## 2017-07-31 LAB — CBC
HCT: 17.8 % — ABNORMAL LOW (ref 40.0–52.0)
Hemoglobin: 5.7 g/dL — ABNORMAL LOW (ref 13.0–18.0)
MCH: 28.1 pg (ref 26.0–34.0)
MCHC: 32.2 g/dL (ref 32.0–36.0)
MCV: 87.2 fL (ref 80.0–100.0)
PLATELETS: 285 10*3/uL (ref 150–440)
RBC: 2.04 MIL/uL — ABNORMAL LOW (ref 4.40–5.90)
RDW: 17.4 % — ABNORMAL HIGH (ref 11.5–14.5)
WBC: 15.3 10*3/uL — ABNORMAL HIGH (ref 3.8–10.6)

## 2017-07-31 LAB — URINALYSIS, COMPLETE (UACMP) WITH MICROSCOPIC
Bilirubin Urine: NEGATIVE
GLUCOSE, UA: NEGATIVE mg/dL
HGB URINE DIPSTICK: NEGATIVE
Ketones, ur: NEGATIVE mg/dL
NITRITE: NEGATIVE
Protein, ur: NEGATIVE mg/dL
SPECIFIC GRAVITY, URINE: 1.023 (ref 1.005–1.030)
pH: 5 (ref 5.0–8.0)

## 2017-07-31 LAB — COMPREHENSIVE METABOLIC PANEL
ALBUMIN: 2.9 g/dL — AB (ref 3.5–5.0)
ALK PHOS: 41 U/L (ref 38–126)
ALT: 12 U/L — AB (ref 17–63)
AST: 23 U/L (ref 15–41)
Anion gap: 6 (ref 5–15)
BUN: 25 mg/dL — AB (ref 6–20)
CALCIUM: 8.2 mg/dL — AB (ref 8.9–10.3)
CO2: 28 mmol/L (ref 22–32)
CREATININE: 1.46 mg/dL — AB (ref 0.61–1.24)
Chloride: 103 mmol/L (ref 101–111)
GFR calc Af Amer: 57 mL/min — ABNORMAL LOW (ref >60)
GFR calc non Af Amer: 49 mL/min — ABNORMAL LOW (ref >60)
GLUCOSE: 167 mg/dL — AB (ref 65–99)
Potassium: 3.5 mmol/L (ref 3.5–5.1)
SODIUM: 137 mmol/L (ref 135–145)
Total Bilirubin: 0.4 mg/dL (ref 0.3–1.2)
Total Protein: 5.4 g/dL — ABNORMAL LOW (ref 6.5–8.1)

## 2017-07-31 LAB — HEMOGLOBIN AND HEMATOCRIT, BLOOD
HCT: 23.8 % — ABNORMAL LOW (ref 40.0–52.0)
HEMOGLOBIN: 7.6 g/dL — AB (ref 13.0–18.0)

## 2017-07-31 LAB — PREPARE RBC (CROSSMATCH)

## 2017-07-31 LAB — LIPASE, BLOOD: LIPASE: 22 U/L (ref 11–51)

## 2017-07-31 MED ORDER — SIMVASTATIN 20 MG PO TABS
40.0000 mg | ORAL_TABLET | Freq: Every day | ORAL | Status: DC
  Administered 2017-07-31 – 2017-08-01 (×2): 40 mg via ORAL
  Filled 2017-07-31 (×2): qty 2

## 2017-07-31 MED ORDER — BISACODYL 10 MG RE SUPP: 10.0000 mg | Freq: Every day | RECTAL | Status: DC | PRN

## 2017-07-31 MED ORDER — ONDANSETRON HCL 4 MG/2ML IJ SOLN
4.0000 mg | Freq: Once | INTRAMUSCULAR | Status: AC
  Administered 2017-07-31: 4 mg via INTRAVENOUS
  Filled 2017-07-31: qty 2

## 2017-07-31 MED ORDER — ONDANSETRON HCL 4 MG/2ML IJ SOLN: 4.0000 mg | Freq: Four times a day (QID) | INTRAMUSCULAR | Status: DC | PRN

## 2017-07-31 MED ORDER — PANTOPRAZOLE SODIUM 40 MG IV SOLR: 40.0000 mg | Freq: Once | INTRAVENOUS | Status: DC

## 2017-07-31 MED ORDER — SODIUM CHLORIDE 0.9 % IV SOLN
8.0000 mg/h | INTRAVENOUS | Status: DC
  Administered 2017-07-31: 8 mg/h via INTRAVENOUS
  Filled 2017-07-31 (×2): qty 80

## 2017-07-31 MED ORDER — ACETAMINOPHEN 650 MG RE SUPP: 650.0000 mg | Freq: Four times a day (QID) | RECTAL | Status: DC | PRN

## 2017-07-31 MED ORDER — TRANEXAMIC ACID 1000 MG/10ML IV SOLN
1000.0000 mg | Freq: Once | INTRAVENOUS | Status: DC
  Filled 2017-07-31: qty 10

## 2017-07-31 MED ORDER — ACETAMINOPHEN 325 MG PO TABS: 650.0000 mg | ORAL_TABLET | Freq: Four times a day (QID) | ORAL | Status: DC | PRN

## 2017-07-31 MED ORDER — CIPROFLOXACIN IN D5W 400 MG/200ML IV SOLN
400.0000 mg | Freq: Two times a day (BID) | INTRAVENOUS | Status: DC
  Administered 2017-07-31 – 2017-08-01 (×2): 400 mg via INTRAVENOUS
  Filled 2017-07-31 (×4): qty 200

## 2017-07-31 MED ORDER — DOCUSATE SODIUM 100 MG PO CAPS
100.0000 mg | ORAL_CAPSULE | Freq: Two times a day (BID) | ORAL | Status: DC
  Administered 2017-08-01: 100 mg via ORAL
  Filled 2017-07-31 (×3): qty 1

## 2017-07-31 MED ORDER — AMLODIPINE BESYLATE 5 MG PO TABS
2.5000 mg | ORAL_TABLET | Freq: Every day | ORAL | Status: DC
  Administered 2017-08-01: 2.5 mg via ORAL
  Filled 2017-07-31: qty 1

## 2017-07-31 MED ORDER — TRAMADOL HCL 50 MG PO TABS
100.0000 mg | ORAL_TABLET | Freq: Four times a day (QID) | ORAL | Status: DC | PRN
  Administered 2017-08-01 – 2017-08-02 (×4): 100 mg via ORAL
  Filled 2017-07-31 (×4): qty 2

## 2017-07-31 MED ORDER — ONDANSETRON HCL 4 MG PO TABS: 4.0000 mg | ORAL_TABLET | Freq: Four times a day (QID) | ORAL | Status: DC | PRN

## 2017-07-31 MED ORDER — PANTOPRAZOLE SODIUM 40 MG IV SOLR: 40.0000 mg | Freq: Two times a day (BID) | INTRAVENOUS | Status: DC

## 2017-07-31 MED ORDER — SODIUM CHLORIDE 0.9 % IV SOLN
Freq: Once | INTRAVENOUS | Status: AC
  Administered 2017-07-31: 12:00:00 via INTRAVENOUS

## 2017-07-31 MED ORDER — SODIUM CHLORIDE 0.9 % IV SOLN
80.0000 mg | Freq: Once | INTRAVENOUS | Status: DC
  Filled 2017-07-31: qty 80

## 2017-07-31 MED ORDER — METRONIDAZOLE IN NACL 5-0.79 MG/ML-% IV SOLN
500.0000 mg | Freq: Three times a day (TID) | INTRAVENOUS | Status: DC
  Administered 2017-07-31 – 2017-08-01 (×3): 500 mg via INTRAVENOUS
  Filled 2017-07-31 (×5): qty 100

## 2017-07-31 NOTE — Progress Notes (Signed)
Transfusion completed. VSS. Pt on RA at this time. No SOB. No reactions noted. Wife at bedside. No concerns at this time. Will continue to monitor.   [name], RN

## 2017-07-31 NOTE — ED Notes (Signed)
Patient transported to 1A by this RN. Patient transferred from ED stretcher to 1A bed independently by scooting himself over. Greeted by Bernestine AmassAlly, 1A staff RN. Patient tolerated transport well.

## 2017-07-31 NOTE — ED Triage Notes (Signed)
Pt arrived via POV from home with reports of dizziness, weakness and bright red rectal bleeding. Pt has hx of diverticulitis and has had to have a blood transfusion.  Pt also vomiting at home which was described as undigested food.  No coffee ground emesis and no dark stools. Pt is pale and c/o dizziness sitting in the chair.

## 2017-07-31 NOTE — Progress Notes (Signed)
Completed first unit of blood. VSS. No reactions noted.   Suzan SlickAlison L Ashling Roane, RN

## 2017-07-31 NOTE — Progress Notes (Signed)
Patient arrived to floor with c/o SOB. VS obtained. Patient on RA at 84%. Pt placed on O2 at this time.   Suzan SlickAlison L Zakara Parkey, RN

## 2017-07-31 NOTE — ED Notes (Signed)
Patient given water to drink. Ok per Dr. Lamont Snowballifenbark.

## 2017-07-31 NOTE — Progress Notes (Signed)
2nd unit of blood transfusing now. VSS. Will continue to monitor. Wife at bedside.   Suzan SlickAlison L Amarya Kuehl, RN

## 2017-07-31 NOTE — ED Notes (Signed)
Attempted to call report x 1  

## 2017-07-31 NOTE — ED Notes (Signed)
Per verbal order from Dr. Lamont Snowballifenbark patient will not have emergency release blood at this time.

## 2017-07-31 NOTE — H&P (Signed)
History and Physical    Caleb Edwards:[id number] DOB: [birth year] DOA: 07/31/2017  Referring physician: Dr. Lamont Snowball PCP: [facility]  Specialists: none  Chief Complaint: weakness and dizziness with blood in bowels  HPI: Caleb Edwards is a 66 y.o. male has a past medical history significant for HTN, PUD, and diverticulosis now with 2=3 day hx of maroon colored stools with weakness, dizziness, SOB, and near syncope. In ER, hgb=5.4 with guaiac positive stools. Pt c/o left-sided abdominal pain. He is now admitted. No fever. Denies CP. No actual syncope or palpitations. Some N/V/D.  Review of Systems: The patient denies anorexia, fever, weight loss,, vision loss, decreased hearing, hoarseness, chest pain, syncope,  peripheral edema, balance deficits, hemoptysis,, severe indigestion/heartburn, hematuria, incontinence, genital sores, muscle weakness, suspicious skin lesions, transient blindness, difficulty walking, depression, unusual weight change, abnormal bleeding, enlarged lymph nodes, angioedema, and breast masses.   Past Medical History:  Diagnosis Date  . Back pain   . Blood transfusion without reported diagnosis 12/2015   bleeding ulcer hospitalization.   . Hypertension   . Ulcer, stomach peptic, chronic    Past Surgical History:  Procedure Laterality Date  . ESOPHAGOGASTRODUODENOSCOPY (EGD) WITH PROPOFOL N/A 01/03/2016   Procedure: ESOPHAGOGASTRODUODENOSCOPY (EGD) WITH PROPOFOL;  Surgeon: Scot Jun, MD;  Location[facility] ENDOSCOPY;  Service: Endoscopy;  Laterality: N/A;   Social History:  reports that he has quit smoking. He has never used smokeless tobacco. He reports that he does not drink alcohol or use drugs.  No Known Allergies  Family History  Problem Relation Age of Onset  . Diabetes Mother   . Heart disease Father   . Prostate cancer Brother   . Bladder Cancer Neg Hx   . Kidney cancer Neg Hx     Prior to Admission medications    Medication Sig Start Date End Date Taking? Authorizing Provider  amLODipine (NORVASC) 2.5 MG tablet Take 2.5 mg by mouth daily.  11/22/15  Yes [provider]  lisinopril-hydrochlorothiazide (PRINZIDE,ZESTORETIC) 20-12.5 MG tablet Take 1 tablet by mouth daily.  11/22/15  Yes [provider]  Multiple Vitamins-Minerals (MULTIVITAMIN ADULT PO) Take 1 tablet by mouth daily.   Yes [provider]  pantoprazole (PROTONIX) 40 MG tablet Take 1 tablet (40 mg total) by mouth 2 (two) times daily before a meal. Patient taking differently: Take 40 mg by mouth daily.  01/04/16  Yes Shaune Pollack, MD  simvastatin (ZOCOR) 40 MG tablet Take 40 mg by mouth daily at 6 PM.  08/24/16  Yes [provider]  traMADol (ULTRAM) 50 MG tablet Take 2 tablets (100 mg total) by mouth every 6 (six) hours as needed for severe pain. 03/24/17 09/20/17 Yes Barbette Merino, NP  VIAGRA 100 MG tablet Take 100 mg by mouth as needed.  09/06/16   [provider]   Physical Exam: Vitals:   07/31/17 1110  BP: 122/72  Pulse: 99  Resp: 16  SpO2: 100%  Weight: 75.8 kg (167 lb)  Height: 6' (1.829 m)     General:  No apparent distress, WDWN, [place]/AT  Eyes: PERRL, EOMI, no scleral icterus, conjunctiva pale  ENT: moist oropharynx without exudate, TM's benign, dentition good  Neck: supple, no lymphadenopathy. No bruits or thyromegaly  Cardiovascular: regular rate without MRG; 2+ peripheral pulses, no JVD, no peripheral edema  Respiratory: CTA biL, good air movement without wheezing, rhonchi or crackled. Respiratory effort normal  Abdomen: soft,  tender to palpation on the left, positive bowel  sounds, no guarding, no rebound  Skin: no rashes or lesions  Musculoskeletal: normal bulk and tone, no joint swelling  Psychiatric: normal mood and affect, A&OX3  Neurologic: CN 2-12 grossly intact, Motor strength 5/5 in all 4 groups with symmetric DTR's and non-focal sensory exam  Labs on  Admission:  Basic Metabolic Panel:  Recent Labs Lab 07/31/17 1128  NA 137  K 3.5  CL 103  CO2 28  GLUCOSE 167*  BUN 25*  CREATININE 1.46*  CALCIUM 8.2*   Liver Function Tests:  Recent Labs Lab 07/31/17 1128  AST 23  ALT 12*  ALKPHOS 41  BILITOT 0.4  PROT 5.4*  ALBUMIN 2.9*    Recent Labs Lab 07/31/17 1128  LIPASE 22   No results for input(s): AMMONIA in the last 168 hours. CBC:  Recent Labs Lab 07/31/17 1128  WBC 15.3*  HGB 5.7*  HCT 17.8*  MCV 87.2  PLT 285   Cardiac Enzymes: No results for input(s): CKTOTAL, CKMB, CKMBINDEX, TROPONINI in the last 168 hours.  BNP (last 3 results) No results for input(s): BNP in the last 8760 hours.  ProBNP (last 3 results) No results for input(s): PROBNP in the last 8760 hours.  CBG: No results for input(s): GLUCAP in the last 168 hours.  Radiological Exams on Admission: No results found.  EKG: Independently reviewed.  Assessment/Plan Principal Problem:   GI bleed Active Problems:   Acute blood loss anemia   Abdominal pain   Near syncope   Will begin IV Protonix and empiric IV ABX. Clear liquid diet. Transfuse 2u PRBC"'s now. Consult GI. Follow hgb closely.  Diet: clear liquids Fluids: NS@100  DVT Prophylaxis: TED hose  Code Status: FULL  Family Communication: yes  Disposition Plan: home  Time spent: 50 min

## 2017-07-31 NOTE — ED Notes (Signed)
Pt vomited x2 in triage, clear yellow liquid, pt became confused briefly and had to be re-oriented to where he was.

## 2017-07-31 NOTE — ED Provider Notes (Signed)
[facility] Emergency Department Provider Note  ____________________________________________   First MD Initiated Contact with Patient 07/31/17 1127     (approximate)  I have reviewed the triage vital signs and the nursing notes.   HISTORY  Chief Complaint GI Bleeding and Weakness   HPI Caleb Edwards is a 66 y.o. male who self presents to the emergency department with his wife with 2-3 days of generalized malaise and 1 day of increasing hematochezia.  He has had mild to moderate diffuse abdominal discomfort as well as nausea and vomiting.  He has vomited up clear liquids.  Yesterday when he had a bowel movement there was dark red blood and once again today.  He has a past medical history of diverticulitis and has had a blood transfusion in the past.  He said no fevers or chills.  His symptoms seem to be worse when defecating and improved when not.  01/03/16 endoscopy: - Mild Schatzki ring. - Small hiatal hernia. - Non-bleeding gastric ulcer with no stigmata of bleeding. - Acquired duodenal stenosi  Colonoscopy: 05/10/14 (Dr. Council MechanicGangarosa) - diverticulosis throughout the colon with Narrowing associated with the diverticular opening, peridiverticular erythema and petechia (also of note, perianal and digital rectal exams normal) repeat in 5 years recommended;        Past Medical History:  Diagnosis Date  . Back pain   . Blood transfusion without reported diagnosis 12/2015   bleeding ulcer hospitalization.   . Hypertension   . Ulcer, stomach peptic, chronic     Patient Active Problem List   Diagnosis Date Noted  . GI bleed 07/31/2017  . Acute blood loss anemia 07/31/2017  . Abdominal pain 07/31/2017  . Near syncope 07/31/2017  . Chronic pain syndrome 09/09/2016  . Vitamin D insufficiency 01/07/2016  . Symptomatic anemia 01/02/2016  . Long term current use of opiate analgesic 09/24/2015  . Long term prescription opiate use 09/24/2015  . Opiate use  09/24/2015  . Encounter for therapeutic drug level monitoring 09/24/2015  . Encounter for chronic pain management 09/24/2015  . Insomnia secondary to chronic pain 09/24/2015  . Osteoarthritis 09/24/2015  . Chronic low back pain (Location of Primary Source of Pain) (Bilateral) (R>L) 09/24/2015  . Lumbar spondylosis 09/24/2015  . Chronic lower extremity pain (Right) 09/24/2015  . 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 Lumbar facet syndrome (Location of Primary Source of Pain) (Bilateral) (R>L) 09/24/2015  . Diffuse myofascial pain syndrome 09/24/2015  . L4-5 disc bulge 09/24/2015  . 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 Chronic neck pain 09/24/2015  . Cervical spondylosis 09/24/2015    Past Surgical History:  Procedure Laterality Date  . ESOPHAGOGASTRODUODENOSCOPY (EGD) WITH PROPOFOL N/A 01/03/2016   Procedure: ESOPHAGOGASTRODUODENOSCOPY (EGD) WITH PROPOFOL;  Surgeon: Scot Junobert T Elliott, MD;  [facility];  Service: Endoscopy;  Laterality: N/A;    Prior to Admission medications   Medication Sig Start Date End Date Taking? Authorizing Provider  amLODipine (NORVASC) 2.5 MG tablet Take 2.5 mg by mouth daily.  11/22/15  Yes [provider]  lisinopril-hydrochlorothiazide (PRINZIDE,ZESTORETIC) 20-12.5 MG tablet Take 1 tablet by mouth daily.  11/22/15  Yes [provider]  Multiple Vitamins-Minerals (MULTIVITAMIN ADULT PO) Take 1 tablet by mouth daily.   Yes [provider]  pantoprazole (PROTONIX) 40 MG tablet Take 1 tablet (40 mg total) by mouth 2 (two) times daily before a meal. Patient taking differently: Take 40 mg by mouth daily.  01/04/16  Yes Imogene Burnhen,  Alfredo Batty, MD  simvastatin (ZOCOR) 40 MG tablet Take 40 mg by mouth daily at 6 PM.  08/24/16  Yes [provider]  traMADol (ULTRAM) 50 MG tablet Take 2 tablets (100 mg total) by mouth every 6 (six) hours as needed for severe pain. 03/24/17 09/20/17 Yes  Barbette Merino, NP  VIAGRA 100 MG tablet Take 100 mg by mouth as needed.  09/06/16   [provider]    Allergies Patient has no known allergies.  Family History  Problem Relation Age of Onset  . Diabetes Mother   . Heart disease Father   . Prostate cancer Brother   . Bladder Cancer Neg Hx   . Kidney cancer Neg Hx     Social History Social History  Substance Use Topics  . Smoking status: Former Games developer  . Smokeless tobacco: Never Used  . Alcohol use No    Review of Systems Constitutional: No fever/chills Eyes: No visual changes. ENT: No sore throat. Cardiovascular: Denies chest pain. Respiratory: Denies shortness of breath. Gastrointestinal: Positive for abdominal pain.  Positive for nausea, positive for vomiting.  No diarrhea.  No constipation. Genitourinary: Negative for dysuria. Musculoskeletal: Negative for back pain. Skin: Negative for rash. Neurological: Negative for headaches, focal weakness or numbness.   ____________________________________________   PHYSICAL EXAM:  VITAL SIGNS: ED Triage Vitals [07/31/17 1110]  Enc Vitals Group     BP 122/72     Pulse Rate 99     Resp 16     Temp      Temp src      SpO2 100 %     Weight 167 lb (75.8 kg)     Height 6' (1.829 m)     Head Circumference      Peak Flow      Pain Score      Pain Loc      Pain Edu?      Excl. in GC?     Constitutional: Appears tired and uncomfortable.  Mild diaphoresis. Eyes: PERRL EOMI. significant conjunctival pallor Head: Atraumatic. Nose: No congestion/rhinnorhea. Mouth/Throat: No trismus Neck: No stridor.   Cardiovascular: Tachycardic rate, regular rhythm. Grossly normal heart sounds.  Good peripheral circulation. Respiratory: Normal respiratory effort.  No retractions. Lungs CTAB and moving good air Gastrointestinal: Soft nondistended nontender no rebound or guarding no peritonitis Guaiac positive control positive current jelly stools Musculoskeletal: No lower  extremity edema   Neurologic:  . No gross focal neurologic deficits are appreciated. Skin: Mild diaphoresis no rash.    ____________________________________________   DIFFERENTIAL includes but not limited to  Upper GI bleed, lower GI bleed, anemia ____________________________________________   LABS (all labs ordered are listed, but only abnormal results are displayed)  Labs Reviewed  COMPREHENSIVE METABOLIC PANEL - Abnormal; Notable for the following:       Result Value   Glucose, Bld 167 (*)    BUN 25 (*)    Creatinine, Ser 1.46 (*)    Calcium 8.2 (*)    Total Protein 5.4 (*)    Albumin 2.9 (*)    ALT 12 (*)    GFR calc non Af Amer 49 (*)    GFR calc Af Amer 57 (*)    All other components within normal limits  CBC - Abnormal; Notable for the following:    WBC 15.3 (*)    RBC 2.04 (*)    Hemoglobin 5.7 (*)    HCT 17.8 (*)    RDW 17.4 (*)    All  other components within normal limits  LIPASE, BLOOD  URINALYSIS, COMPLETE (UACMP) WITH MICROSCOPIC  TYPE AND SCREEN  PREPARE RBC (CROSSMATCH)    Blood work reviewed and interpreted by me shows hemoglobin of 5.7 with high RDW concerning for iron deficiency __________________________________________  EKG  ED ECG REPORT I, Merrily Brittle, the attending physician, personally viewed and interpreted this ECG.  Date: 07/31/2017 EKG Time:  Rate: 98 Rhythm: normal sinus rhythm QRS Axis: normal Intervals: normal ST/T Wave abnormalities: normal Narrative Interpretation: no evidence of acute ischemia  ____________________________________________  RADIOLOGY   ____________________________________________   PROCEDURES  Procedure(s) performed: no  Procedures  Critical Care performed: yes  CRITICAL CARE Performed by: Merrily Brittle   Total critical care time: 35 minutes  Critical care time was exclusive of separately billable procedures and treating other patients.  Critical care was necessary to treat or  prevent imminent or life-threatening deterioration.  Critical care was time spent personally by me on the following activities: development of treatment plan with patient and/or surrogate as well as nursing, discussions with consultants, evaluation of patient's response to treatment, examination of patient, obtaining history from patient or surrogate, ordering and performing treatments and interventions, ordering and review of laboratory studies, ordering and review of radiographic studies, pulse oximetry and re-evaluation of patient's condition.   Observation: no ____________________________________________   INITIAL IMPRESSION / ASSESSMENT AND PLAN / ED COURSE  Pertinent labs & imaging results that were available during my care of the patient were reviewed by me and considered in my medical decision making (see chart for details).  The patient arrives borderline tachycardic with gross hematochezia on exam.  He has significant conjunctival pallor.  Blood work is pending.  On chart review he has no history of cirrhosis.     ----------------------------------------- 12:10 PM on 07/31/2017 -----------------------------------------  The patient's hemoglobin came back at 5.7 and he is symptomatic.  I will order 2 units of blood.  I discussed the case with on-call gastroenterologist Dr. Servando Snare who will kindly consult on the patient.  I also discussed the case with the hospitalist Dr. Judithann Sheen who is graciously agreed to admit the patient to service.  I discussed the results with the patient and family who verbalized understanding and agreement with the plan. ____________________________________________   FINAL CLINICAL IMPRESSION(S) / ED DIAGNOSES  Final diagnoses:  Symptomatic anemia  Gastrointestinal hemorrhage, unspecified gastrointestinal hemorrhage type      NEW MEDICATIONS STARTED DURING THIS VISIT:  New Prescriptions   No medications on file     Note:  This document was  prepared using Dragon voice recognition software and may include unintentional dictation errors.     Merrily Brittle, MD 07/31/17 1257

## 2017-07-31 NOTE — Progress Notes (Signed)
1st unit of blood started at this time. VSS. Will continue to monitor.   Suzan SlickAlison L Lonie Rummell, RN

## 2017-08-01 LAB — COMPREHENSIVE METABOLIC PANEL
ALT: 12 U/L — ABNORMAL LOW (ref 17–63)
ANION GAP: 4 — AB (ref 5–15)
AST: 21 U/L (ref 15–41)
Albumin: 3.1 g/dL — ABNORMAL LOW (ref 3.5–5.0)
Alkaline Phosphatase: 41 U/L (ref 38–126)
BUN: 17 mg/dL (ref 6–20)
CHLORIDE: 108 mmol/L (ref 101–111)
CO2: 27 mmol/L (ref 22–32)
Calcium: 8.3 mg/dL — ABNORMAL LOW (ref 8.9–10.3)
Creatinine, Ser: 1.22 mg/dL (ref 0.61–1.24)
GFR calc Af Amer: >60 mL/min (ref >60)
Glucose, Bld: 103 mg/dL — ABNORMAL HIGH (ref 65–99)
POTASSIUM: 3.9 mmol/L (ref 3.5–5.1)
Sodium: 139 mmol/L (ref 135–145)
TOTAL PROTEIN: 5.7 g/dL — AB (ref 6.5–8.1)
Total Bilirubin: 0.7 mg/dL (ref 0.3–1.2)

## 2017-08-01 LAB — CBC
HEMATOCRIT: 27.7 % — AB (ref 40.0–52.0)
HEMOGLOBIN: 9.4 g/dL — AB (ref 13.0–18.0)
MCH: 30.3 pg (ref 26.0–34.0)
MCHC: 34 g/dL (ref 32.0–36.0)
MCV: 89.2 fL (ref 80.0–100.0)
Platelets: 163 10*3/uL (ref 150–440)
RBC: 3.1 MIL/uL — AB (ref 4.40–5.90)
RDW: 15.8 % — AB (ref 11.5–14.5)
WBC: 9.5 10*3/uL (ref 3.8–10.6)

## 2017-08-01 LAB — PREPARE RBC (CROSSMATCH)

## 2017-08-01 MED ORDER — SODIUM CHLORIDE 0.9 % IV SOLN
Freq: Once | INTRAVENOUS | Status: AC
  Administered 2017-08-01: 03:00:00 via INTRAVENOUS

## 2017-08-01 MED ORDER — POLYETHYLENE GLYCOL 3350 17 GM/SCOOP PO POWD
1.0000 | Freq: Once | ORAL | Status: AC
  Administered 2017-08-01: 255 g via ORAL
  Filled 2017-08-01: qty 255

## 2017-08-01 MED ORDER — PANTOPRAZOLE SODIUM 40 MG IV SOLR
40.0000 mg | Freq: Two times a day (BID) | INTRAVENOUS | Status: DC
  Administered 2017-08-01 (×2): 40 mg via INTRAVENOUS
  Filled 2017-08-01 (×2): qty 40

## 2017-08-01 MED ORDER — FUROSEMIDE 10 MG/ML IJ SOLN
20.0000 mg | Freq: Two times a day (BID) | INTRAMUSCULAR | Status: DC | PRN
  Administered 2017-08-01: 20 mg via INTRAVENOUS
  Filled 2017-08-01: qty 4

## 2017-08-01 NOTE — Progress Notes (Signed)
Notified Dr. Allena KatzPatel that pt's hgb is 9.4 and pt has had no stool since yesterday morning. Received order to hold 4th unit of PRBCs at this time.   [place], Latricia HeftKorie G

## 2017-08-01 NOTE — Progress Notes (Signed)
[facility] - [place] at [facility]   PATIENT NAME: Caleb Edwards    MR#:  [id number]  DATE OF BIRTH:  09-Aug-1951  SUBJECTIVE:  Patient came in with maroon stools.  None since coming to the hospital.  Wife in the room. As of constipation and straining lately with bowel movements REVIEW OF SYSTEMS:   Review of Systems  Constitutional: Negative for chills, fever and weight loss.  HENT: Negative for ear discharge, ear pain and nosebleeds.   Eyes: Negative for blurred vision, pain and discharge.  Respiratory: Negative for sputum production, shortness of breath, wheezing and stridor.   Cardiovascular: Negative for chest pain, palpitations, orthopnea and PND.  Gastrointestinal: Positive for blood in stool. Negative for abdominal pain, diarrhea, nausea and vomiting.  Genitourinary: Negative for frequency and urgency.  Musculoskeletal: Negative for back pain and joint pain.  Neurological: Positive for weakness. Negative for sensory change, speech change and focal weakness.  Psychiatric/Behavioral: Negative for depression and hallucinations. The patient is not nervous/anxious.    Tolerating Diet:cld Tolerating PT: not needed  DRUG ALLERGIES:  No Known Allergies  VITALS:  Blood pressure 126/68, pulse 62, temperature 99 F (37.2 C), temperature source Oral, resp. rate 17, height 6' (1.829 m), weight 79.1 kg (174 lb 4.8 oz), SpO2 100 %.  PHYSICAL EXAMINATION:   Physical Exam  GENERAL:  66 y.o.-year-old patient lying in the bed with no acute distress.  EYES: Pupils equal, round, reactive to light and accommodation. No scleral icterus. Extraocular muscles intact.  HEENT: Head atraumatic, normocephalic. Oropharynx and nasopharynx clear.  NECK:  Supple, no jugular venous distention. No thyroid enlargement, no tenderness.  LUNGS: Normal breath sounds bilaterally, no wheezing, rales, rhonchi. No use of accessory muscles of respiration.  CARDIOVASCULAR: S1, S2 normal.  No murmurs, rubs, or gallops.  ABDOMEN: Soft, nontender, nondistended. Bowel sounds present. No organomegaly or mass.  EXTREMITIES: No cyanosis, clubbing or edema b/l.    NEUROLOGIC: Cranial nerves II through XII are intact. No focal Motor or sensory deficits b/l.   PSYCHIATRIC:  patient is alert and oriented x 3.  SKIN: No obvious rash, lesion, or ulcer.   LABORATORY PANEL:  CBC  Recent Labs Lab 08/01/17 0707  WBC 9.5  HGB 9.4*  HCT 27.7*  PLT 163    Chemistries   Recent Labs Lab 08/01/17 0707  NA 139  K 3.9  CL 108  CO2 27  GLUCOSE 103*  BUN 17  CREATININE 1.22  CALCIUM 8.3*  AST 21  ALT 12*  ALKPHOS 41  BILITOT 0.7   Cardiac Enzymes No results for input(s): TROPONINI in the last 168 hours. RADIOLOGY:  No results found. ASSESSMENT AND PLAN:    ABDIRAHMAN CHITTUM is a 66 y.o. male has a past medical history significant for HTN, PUD, and diverticulosis now with 2=3 day hx of maroon colored stools with weakness, dizziness, SOB, and near syncope. In ER, hgb=5.4 with guaiac positive stools. Pt c/o left-sided abdominal pain  1.  GI bleed -Patient presented with bright red blood per rectum mixed with some maroon stools.  He has history of diverticulosis.  Recently having constipation and straining last few days -came in with hemoglobin of 5.4----3 units blood transfusion--- 9.9 -Seen by GI plans for colonoscopy tomorrow  2 history of peptic ulcer disease -No coffee-ground emesis or vomiting blood -Continue IV PPI twice daily  3.  Hypertension -Stable continue home meds  4.  DVT prophylaxis SCD and teds Case discussed with Care Management/Social Worker.  Management plans discussed with the patient, family and they are in agreement.  CODE STATUS: Full  DVT Prophylaxis: SCD teds  TOTAL TIME TAKING CARE OF THIS PATIENT: *30* minutes.  >50% time spent on counselling and coordination of care  POSSIBLE D/C IN 1-2 DAYS, DEPENDING ON CLINICAL CONDITION.  Note:  This dictation was prepared with Dragon dictation along with smaller phrase technology. Any transcriptional errors that result from this process are unintentional.  Broedy Osbourne M.D on 08/01/2017 at 3:06 PM  Between 7am to 6pm - Pager - [phone]  After 6pm go to [email or web address] - Social research officer, governmentpassword EPAS [email or web address]  [facility]  Office  [phone]  CC: Primary care physician; [facility], [facility]

## 2017-08-01 NOTE — Progress Notes (Signed)
3rd unit of PRBC complete. BP 101/59 paged MD on call to make sure ok to still give lasix iv. MD stated ok to give. Pt shows no signs of reaction to transfusion. Will cont to monitor

## 2017-08-01 NOTE — Consult Note (Signed)
Midge Minium, MD [facility]  [address] [place], [place] [address] Phone: [phone] Fax : [phone]  Consultation  Referring Provider:     Dr. Judithann Sheen Primary Care Physician:  Inc, [facility] Primary Gastroenterologist:  Dr. Mechele Collin         Reason for Consultation:     Hematochezia  Date of Admission:  07/31/2017 Date of Consultation:  08/01/2017         HPI:   Caleb Edwards is a 66 y.o. male who was admitted to the hospital with a history of 3 days of maroon stools.  He states at times it was also bright red in color.  The patient decided to come to the emergency room when the patient started to feel weak and dizzy.  He also had shortness of breath and felt like he was going to faint.  The patient had an admission hemoglobin of 5.4 and his stools were tested guaiac positive.  The patient had reported to the admitting physician that he had left-sided abdominal pain but reported to me that he did not have any pain associated with this bleeding.  He also denies any unexplained weight loss fevers or chills.  The patient believes he had a colonoscopy and although I do not have the records of any colonoscopies I do see a visit to the endoscopy unit back in 2013.  The patient did have an ulcer on an upper endoscopy by Dr. Mechele Collin in April 2017.  Past Medical History:  Diagnosis Date  . Back pain   . Blood transfusion without reported diagnosis 12/2015   bleeding ulcer hospitalization.   . Hypertension   . Ulcer, stomach peptic, chronic     Past Surgical History:  Procedure Laterality Date  . ESOPHAGOGASTRODUODENOSCOPY (EGD) WITH PROPOFOL N/A 01/03/2016   Procedure: ESOPHAGOGASTRODUODENOSCOPY (EGD) WITH PROPOFOL;  Surgeon: Scot Jun, MD;  Location: [facility] ENDOSCOPY;  Service: Endoscopy;  Laterality: N/A;    Prior to Admission medications   Medication Sig Start Date End Date Taking? Authorizing Provider  amLODipine (NORVASC) 2.5 MG tablet Take 2.5 mg by  mouth daily.  11/22/15  Yes [provider]  lisinopril-hydrochlorothiazide (PRINZIDE,ZESTORETIC) 20-12.5 MG tablet Take 1 tablet by mouth daily.  11/22/15  Yes [provider]  Multiple Vitamins-Minerals (MULTIVITAMIN ADULT PO) Take 1 tablet by mouth daily.   Yes [provider]  pantoprazole (PROTONIX) 40 MG tablet Take 1 tablet (40 mg total) by mouth 2 (two) times daily before a meal. Patient taking differently: Take 40 mg by mouth daily.  01/04/16  Yes Shaune Pollack, MD  simvastatin (ZOCOR) 40 MG tablet Take 40 mg by mouth daily at 6 PM.  08/24/16  Yes [provider]  traMADol (ULTRAM) 50 MG tablet Take 2 tablets (100 mg total) by mouth every 6 (six) hours as needed for severe pain. 03/24/17 09/20/17 Yes Barbette Merino, NP  VIAGRA 100 MG tablet Take 100 mg by mouth as needed.  09/06/16   [provider]    Family History  Problem Relation Age of Onset  . Diabetes Mother   . Heart disease Father   . Prostate cancer Brother   . Bladder Cancer Neg Hx   . Kidney cancer Neg Hx      Social History  Substance Use Topics  . Smoking status: Former Games developer  . Smokeless tobacco: Never Used  . Alcohol use No    Allergies as of 07/31/2017  . (No Known Allergies)  Review of Systems:    All systems reviewed and negative except where noted in HPI.   Physical Exam:  Vital signs in last 24 hours: Temp:  [98 F (36.7 C)-99 F (37.2 C)] 99 F (37.2 C) (10/29 0812) Pulse Rate:  [57-99] 62 (10/29 0812) Resp:  [14-17] 17 (10/29 0812) BP: (101-139)/(43-72) 126/68 (10/29 0812) SpO2:  [80 %-100 %] 100 % (10/29 0812) Weight:  [167 lb (75.8 kg)-174 lb 4.8 oz (79.1 kg)] 174 lb 4.8 oz (79.1 kg) (10/29 0500) Last BM Date: 07/31/17 General:   Pleasant, cooperative in NAD Head:  Normocephalic and atraumatic. Eyes:   No icterus.   Conjunctiva pink. PERRLA. Ears:  Normal auditory acuity. Neck:  Supple; no masses or thyroidomegaly Lungs: Respirations even and  unlabored. Lungs clear to auscultation bilaterally.   No wheezes, crackles, or rhonchi.  Heart:  Regular rate and rhythm;  Without murmur, clicks, rubs or gallops Abdomen:  Soft, nondistended, nontender. Normal bowel sounds. No appreciable masses or hepatomegaly.  No rebound or guarding.  Rectal:  Not performed. Msk:  Symmetrical without gross deformities.    Extremities:  Without edema, cyanosis or clubbing. Neurologic:  Alert and oriented x3;  grossly normal neurologically. Skin:  Intact without significant lesions or rashes. Cervical Nodes:  No significant cervical adenopathy. Psych:  Alert and cooperative. Normal affect.  LAB RESULTS:  Recent Labs  07/31/17 1128 07/31/17 2051 08/01/17 0707  WBC 15.3*  --  9.5  HGB 5.7* 7.6* 9.4*  HCT 17.8* 23.8* 27.7*  PLT 285  --  163   BMET  Recent Labs  07/31/17 1128 08/01/17 0707  NA 137 139  K 3.5 3.9  CL 103 108  CO2 28 27  GLUCOSE 167* 103*  BUN 25* 17  CREATININE 1.46* 1.22  CALCIUM 8.2* 8.3*   LFT  Recent Labs  08/01/17 0707  PROT 5.7*  ALBUMIN 3.1*  AST 21  ALT 12*  ALKPHOS 41  BILITOT 0.7   PT/INR No results for input(s): LABPROT, INR in the last 72 hours.  STUDIES: No results found.    Impression / Plan:   Caleb SaunasWilliam J Edwards is a 66 y.o. y/o male with hematochezia for the last 3 days.  The patient appears to have had an upper endoscopy in 2017 with a gastric ulcer but now comes in with maroon stools and bright red blood per rectum.  The patient will be set up for a colonoscopy for tomorrow.  Thank you for involving me in the care of this patient.      LOS: 1 day   Midge Miniumarren Jams Trickett, MD  08/01/2017, 9:56 AM   Note: This dictation was prepared with Dragon dictation along with smaller phrase technology. Any transcriptional errors that result from this process are unintentional.

## 2017-08-02 ENCOUNTER — Inpatient Hospital Stay: Payer: BLUE CROSS/BLUE SHIELD | Admitting: Anesthesiology

## 2017-08-02 ENCOUNTER — Encounter
Admission: EM | Disposition: A | Discharge status: Home / Self Care | Payer: Self-pay | Source: Home / Self Care | Attending: Internal Medicine

## 2017-08-02 DIAGNOSIS — K921 Melena: Secondary | ICD-10-CM

## 2017-08-02 DIAGNOSIS — D62 Acute posthemorrhagic anemia: Secondary | ICD-10-CM

## 2017-08-02 DIAGNOSIS — K922 Gastrointestinal hemorrhage, unspecified: Secondary | ICD-10-CM

## 2017-08-02 HISTORY — PX: COLONOSCOPY WITH PROPOFOL: SHX5780

## 2017-08-02 HISTORY — PX: ESOPHAGOGASTRODUODENOSCOPY (EGD) WITH PROPOFOL: SHX5813

## 2017-08-02 LAB — HEMOGLOBIN
HEMOGLOBIN: 8.9 g/dL — AB (ref 13.0–18.0)
HEMOGLOBIN: 9.1 g/dL — AB (ref 13.0–18.0)

## 2017-08-02 SURGERY — COLONOSCOPY WITH PROPOFOL
Anesthesia: General

## 2017-08-02 MED ORDER — GLYCOPYRROLATE 0.2 MG/ML IJ SOLN
INTRAMUSCULAR | Status: DC | PRN
  Administered 2017-08-02: 0.2 mg via INTRAVENOUS

## 2017-08-02 MED ORDER — PROPOFOL 500 MG/50ML IV EMUL
INTRAVENOUS | Status: DC | PRN
  Administered 2017-08-02: 150 ug/kg/min via INTRAVENOUS

## 2017-08-02 MED ORDER — PANTOPRAZOLE SODIUM 40 MG PO TBEC
40.0000 mg | DELAYED_RELEASE_TABLET | Freq: Every day | ORAL | Status: DC
  Administered 2017-08-02: 40 mg via ORAL
  Filled 2017-08-02: qty 1

## 2017-08-02 MED ORDER — SODIUM CHLORIDE 0.9 % IV SOLN
INTRAVENOUS | Status: DC
  Administered 2017-08-02: 1000 mL via INTRAVENOUS

## 2017-08-02 MED ORDER — EPHEDRINE SULFATE 50 MG/ML IJ SOLN
INTRAMUSCULAR | Status: DC | PRN
  Administered 2017-08-02 (×2): 10 mg via INTRAVENOUS

## 2017-08-02 MED ORDER — EPHEDRINE SULFATE 50 MG/ML IJ SOLN
INTRAMUSCULAR | Status: AC
  Filled 2017-08-02: qty 1

## 2017-08-02 MED ORDER — PROPOFOL 10 MG/ML IV BOLUS
INTRAVENOUS | Status: DC | PRN
  Administered 2017-08-02: 70 mg via INTRAVENOUS

## 2017-08-02 MED ORDER — PROPOFOL 500 MG/50ML IV EMUL
INTRAVENOUS | Status: AC
  Filled 2017-08-02: qty 50

## 2017-08-02 MED ORDER — LIDOCAINE HCL (CARDIAC) 20 MG/ML IV SOLN
INTRAVENOUS | Status: DC | PRN
  Administered 2017-08-02: 50 mg via INTRAVENOUS

## 2017-08-02 MED ORDER — PROPOFOL 10 MG/ML IV BOLUS
INTRAVENOUS | Status: AC
  Filled 2017-08-02: qty 20

## 2017-08-02 MED ORDER — PHENYLEPHRINE HCL 10 MG/ML IJ SOLN
INTRAMUSCULAR | Status: DC | PRN
  Administered 2017-08-02 (×2): 100 ug via INTRAVENOUS

## 2017-08-02 MED ORDER — LIDOCAINE HCL (PF) 2 % IJ SOLN
INTRAMUSCULAR | Status: AC
  Filled 2017-08-02: qty 10

## 2017-08-02 NOTE — Anesthesia Postprocedure Evaluation (Signed)
Anesthesia Post Note  Patient: Caleb SaunasWilliam J Edwards  Procedure(s) Performed: COLONOSCOPY WITH PROPOFOL (N/A ) ESOPHAGOGASTRODUODENOSCOPY (EGD) WITH PROPOFOL  Patient location during evaluation: Endoscopy Anesthesia Type: General Level of consciousness: awake and alert Pain management: pain level controlled Vital Signs Assessment: post-procedure vital signs reviewed and stable Respiratory status: spontaneous breathing and respiratory function stable Cardiovascular status: stable Anesthetic complications: no     Last Vitals:  Vitals:   08/02/17 0810 08/02/17 0945  BP: 128/65 (!) 92/52  Pulse: (!) 54 86  Resp: 16 (!) 37  Temp: 37.5 C 36.6 C  SpO2: 100% 100%    Last Pain:  Vitals:   08/02/17 0945  TempSrc: Tympanic  PainSc: Asleep                 KEPHART,Aden K

## 2017-08-02 NOTE — Op Note (Signed)
[facility] Gastroenterology Patient Name: Caleb Edwards Procedure Date: 08/02/2017 9:08 AM MRN: [id number] Account #: [id number] Date of Birth: 13-Jul-1951 Admit Type: Inpatient Age: 67 Room: [facility] ENDO ROOM 4 Gender: Male Note Status: Finalized Procedure:            Upper GI endoscopy Indications:          Hematochezia, Personal history of peptic ulcer disease Providers:            Midge Minium MD, MD Medicines:            Propofol per Anesthesia Complications:        No immediate complications. Procedure:            Pre-Anesthesia Assessment:                       - Prior to the procedure, a History and Physical was                        performed, and patient medications and allergies were                        reviewed. The patient's tolerance of previous                        anesthesia was also reviewed. The risks and benefits of                        the procedure and the sedation options and risks were                        discussed with the patient. All questions were                        answered, and informed consent was obtained. Prior                        Anticoagulants: The patient has taken no previous                        anticoagulant or antiplatelet agents. ASA Grade                        Assessment: II - A patient with mild systemic disease.                        After reviewing the risks and benefits, the patient was                        deemed in satisfactory condition to undergo the                        procedure.                       After obtaining informed consent, the endoscope was                        passed under direct vision. Throughout the procedure,  the patient's blood pressure, pulse, and oxygen                        saturations were monitored continuously. The Endoscope                        was introduced through the mouth, and advanced to the                        second part of  duodenum. The upper GI endoscopy was                        accomplished without difficulty. The patient tolerated                        the procedure well. Findings:      A medium-sized hiatal hernia was present.      The stomach was normal.      An acquired benign-appearing, intrinsic moderate stenosis was found in       the first portion of the duodenum. Impression:           - Medium-sized hiatal hernia.                       - Normal stomach.                       - Acquired duodenal stenosis.                       - No specimens collected. Recommendation:       - Return patient to hospital ward for ongoing care.                       - Continue present medications. Procedure Code(s):    --- Professional ---                       [phone], Esophagogastroduodenoscopy, flexible, transoral;                        diagnostic, including collection of specimen(s) by                        brushing or washing, when performed (separate procedure) Diagnosis Code(s):    --- Professional ---                       Z87.11, Personal history of peptic ulcer disease                       K92.1, Melena (includes Hematochezia)                       K31.5, Obstruction of duodenum CPT copyright 2016 American [employer] Association. All rights reserved. The codes documented in this report are preliminary and upon coder review may  be revised to meet current compliance requirements. Midge Miniumarren Janathan Bribiesca MD, MD 08/02/2017 9:16:59 AM This report has been signed electronically. Number of Addenda: 0 Note Initiated On: 08/02/2017 9:08 AM      [facility]

## 2017-08-02 NOTE — Anesthesia Procedure Notes (Signed)
Date/Time: 08/02/2017 9:05 AM Performed by: Marlana SalvageJESSUP, Abdoulie Tierce Pre-anesthesia Checklist: Patient identified, Emergency Drugs available, Suction available, Patient being monitored and Timeout performed Patient Re-evaluated:Patient Re-evaluated prior to induction Oxygen Delivery Method: Nasal cannula Placement Confirmation: positive ETCO2

## 2017-08-02 NOTE — Anesthesia Post-op Follow-up Note (Signed)
Anesthesia QCDR form completed.        

## 2017-08-02 NOTE — Op Note (Signed)
[facility] Gastroenterology Patient Name: Caleb Edwards Procedure Date: 08/02/2017 9:02 AM MRN: [id number] Account #: [id number] Date of Birth: Mar 08, 1951 Admit Type: Outpatient Age: 66 Room: [facility] ENDO ROOM 4 Gender: Male Note Status: Finalized Procedure:            Colonoscopy Indications:          Hematochezia Providers:            Midge Minium MD, MD Referring MD:         No Local Md, MD (Referring MD) Medicines:            Propofol per Anesthesia Complications:        No immediate complications. Procedure:            Pre-Anesthesia Assessment:                       - Prior to the procedure, a History and Physical was                        performed, and patient medications and allergies were                        reviewed. The patient's tolerance of previous                        anesthesia was also reviewed. The risks and benefits of                        the procedure and the sedation options and risks were                        discussed with the patient. All questions were                        answered, and informed consent was obtained. Prior                        Anticoagulants: The patient has taken no previous                        anticoagulant or antiplatelet agents. ASA Grade                        Assessment: II - A patient with mild systemic disease.                        After reviewing the risks and benefits, the patient was                        deemed in satisfactory condition to undergo the                        procedure.                       After obtaining informed consent, the colonoscope was                        passed under direct vision. Throughout the procedure,  the patient's blood pressure, pulse, and oxygen                        saturations were monitored continuously. The                        Colonoscope was introduced through the anus and                        advanced to the the cecum,  identified by appendiceal                        orifice and ileocecal valve. The colonoscopy was                        performed without difficulty. The patient tolerated the                        procedure well. The quality of the bowel preparation                        was poor. Findings:      The perianal and digital rectal examinations were normal.      Multiple small-mouthed diverticula were found in the entire colon. Impression:           - Preparation of the colon was poor.                       - Diverticulosis in the entire examined colon.                       - No specimens collected. Recommendation:       - Return patient to hospital ward for ongoing care.                       - Resume previous diet.                       - Continue present medications. Procedure Code(s):    --- Professional ---                       [phone], Colonoscopy, flexible; diagnostic, including                        collection of specimen(s) by brushing or washing, when                        performed (separate procedure) Diagnosis Code(s):    --- Professional ---                       K92.1, Melena (includes Hematochezia) CPT copyright 2016 American Medical Association. All rights reserved. The codes documented in this report are preliminary and upon coder review may  be revised to meet current compliance requirements. Midge Miniumarren Vail Basista MD, MD 08/02/2017 9:40:43 AM This report has been signed electronically. Number of Addenda: 0 Note Initiated On: 08/02/2017 9:02 AM Scope Withdrawal Time: 0 hours 11 minutes 2 seconds  Total Procedure Duration: 0 hours 20 minutes 31 seconds       [facility]

## 2017-08-02 NOTE — Discharge Summary (Signed)
[facility] - [place] at [facility]   PATIENT NAME: Caleb Edwards    MR#:  [id number]  DATE OF BIRTH:  1951/09/25  DATE OF ADMISSION:  07/31/2017 ADMITTING PHYSICIAN: Marguarite Arbour, MD  DATE OF DISCHARGE: 08/02/2017  PRIMARY CARE PHYSICIAN: [facility]    ADMISSION DIAGNOSIS:  Symptomatic anemia [D64.9] Gastrointestinal hemorrhage, unspecified gastrointestinal hemorrhage type [K92.2]  DISCHARGE DIAGNOSIS:  Bright red blood per rectum appears diverticular bleed now resolved\ History of peptic ulcer disease Acute on chronic anemia secondary to GI bleed status post 3 unit blood transfusion  SECONDARY DIAGNOSIS:   Past Medical History:  Diagnosis Date  . Back pain   . Blood transfusion without reported diagnosis 12/2015   bleeding ulcer hospitalization.   . Hypertension   . Ulcer, stomach peptic, chronic     HOSPITAL COURSE:  Caleb Luecke Boykinis a 66 y.o.malehas a past medical history significant for HTN, PUD, and diverticulosis now with 2=3 day hx of maroon colored stools with weakness, dizziness, SOB, and near syncope. In ER, hgb=5.4 with guaiac positive stools. Pt c/o left-sided abdominal pain  1.  GI bleed--she presented with bright red blood per rectum -Patient presented with bright red blood per rectum mixed with some maroon stools.  He has history of diverticulosis.  Recently having constipation and straining last few days -came in with hemoglobin of 5.4----3 units blood transfusion--- 9.9--- 8.9---9.1 -Status post colonoscopy -showed poor prep, old blood , particular disease   2 history of peptic ulcer disease -No coffee-ground emesis or vomiting blood -Continue IV PPI twice daily--order PPI -EGD A medium-sized hiatal hernia was present. The stomach was normal.An acquired benign-appearing, intrinsic moderate stenosis was found in  the first portion of the duodenum.  3.  Hypertension -Stable continue home meds  4.   DVT prophylaxis SCD and teds  Overall patient doing well.  Discharge to home.  This was discussed with wife.  CONSULTS OBTAINED:  Treatment Team:  Midge Minium, MD  DRUG ALLERGIES:  No Known Allergies  DISCHARGE MEDICATIONS:   Current Discharge Medication List    CONTINUE these medications which have NOT CHANGED   Details  amLODipine (NORVASC) 2.5 MG tablet Take 2.5 mg by mouth daily.     lisinopril-hydrochlorothiazide (PRINZIDE,ZESTORETIC) 20-12.5 MG tablet Take 1 tablet by mouth daily.     Multiple Vitamins-Minerals (MULTIVITAMIN ADULT PO) Take 1 tablet by mouth daily.    pantoprazole (PROTONIX) 40 MG tablet Take 1 tablet (40 mg total) by mouth 2 (two) times daily before a meal. Qty: 60 tablet, Refills: 0    simvastatin (ZOCOR) 40 MG tablet Take 40 mg by mouth daily at 6 PM.  Refills: 0    traMADol (ULTRAM) 50 MG tablet Take 2 tablets (100 mg total) by mouth every 6 (six) hours as needed for severe pain. Qty: 240 tablet, Refills: 5   Associated Diagnoses: Chronic pain syndrome    VIAGRA 100 MG tablet Take 100 mg by mouth as needed.  Refills: 0        If you experience worsening of your admission symptoms, develop shortness of breath, life threatening emergency, suicidal or homicidal thoughts you must seek medical attention immediately by calling 911 or calling your MD immediately  if symptoms less severe.  You Must read complete instructions/literature along with all the possible adverse reactions/side effects for all the Medicines you take and that have been prescribed to you. Take any new Medicines after you have completely understood and accept all the  possible adverse reactions/side effects.   Please note  You were cared for by a hospitalist during your hospital stay. If you have any questions about your discharge medications or the care you received while you were in the hospital after you are discharged, you can call the unit and asked to speak with the  hospitalist on call if the hospitalist that took care of you is not available. Once you are discharged, your primary care physician will handle any further medical issues. Please note that NO REFILLS for any discharge medications will be authorized once you are discharged, as it is imperative that you return to your primary care physician (or establish a relationship with a primary care physician if you do not have one) for your aftercare needs so that they can reassess your need for medications and monitor your lab values. Today   SUBJECTIVE   Some dark old bloody discharge with colonoscopy prep VITAL SIGNS:  Blood pressure (!) 99/56, pulse 66, temperature 97.8 F (36.6 C), temperature source Tympanic, resp. rate 19, height 6' (1.829 m), weight 78.5 kg (173 lb 1.6 oz), SpO2 100 %.  I/O:   Intake/Output Summary (Last 24 hours) at 08/02/17 1431 Last data filed at 08/02/17 1418  Gross per 24 hour  Intake              960 ml  Output                0 ml  Net              960 ml    PHYSICAL EXAMINATION:  GENERAL:  66 y.o.-year-old patient lying in the bed with no acute distress.  EYES: Pupils equal, round, reactive to light and accommodation. No scleral icterus. Extraocular muscles intact.  HEENT: Head atraumatic, normocephalic. Oropharynx and nasopharynx clear.  NECK:  Supple, no jugular venous distention. No thyroid enlargement, no tenderness.  LUNGS: Normal breath sounds bilaterally, no wheezing, rales,rhonchi or crepitation. No use of accessory muscles of respiration.  CARDIOVASCULAR: S1, S2 normal. No murmurs, rubs, or gallops.  ABDOMEN: Soft, non-tender, non-distended. Bowel sounds present. No organomegaly or mass.  EXTREMITIES: No pedal edema, cyanosis, or clubbing.  NEUROLOGIC: Cranial nerves II through XII are intact. Muscle strength 5/5 in all extremities. Sensation intact. Gait not checked.  PSYCHIATRIC: The patient is alert and oriented x 3.  SKIN: No obvious rash, lesion, or  ulcer.   DATA REVIEW:   CBC   Recent Labs Lab 08/01/17 0707  08/02/17 1400  WBC 9.5  --   --   HGB 9.4*  < > 9.1*  HCT 27.7*  --   --   PLT 163  --   --   < > = values in this interval not displayed.  Chemistries   Recent Labs Lab 08/01/17 0707  NA 139  K 3.9  CL 108  CO2 27  GLUCOSE 103*  BUN 17  CREATININE 1.22  CALCIUM 8.3*  AST 21  ALT 12*  ALKPHOS 41  BILITOT 0.7    Microbiology Results   No results found for this or any previous visit (from the past 240 hour(s)).  RADIOLOGY:  No results found.   Management plans discussed with the patient, family and they are in agreement.  CODE STATUS:     Code Status Orders        Start     Ordered   07/31/17 1327  Full code  Continuous     07/31/17 1326  Code Status History    Date Active Date Inactive Code Status Order ID Comments User Context   01/02/2016  6:14 PM 01/04/2016  2:20 PM Full Code [id number]  Ramonita LabGouru, Aruna, MD ED      TOTAL TIME TAKING CARE OF THIS PATIENT: *40* minutes.    Hilaria Titsworth M.D on 08/02/2017 at 2:31 PM  Between 7am to 6pm - Pager - [phone] After 6pm go to [email or web address] - Social research officer, governmentpassword [email or web address]  [facility]  Office  [phone]  CC: Primary care physician; [facility]

## 2017-08-02 NOTE — Transfer of Care (Signed)
Immediate Anesthesia Transfer of Care Note  Patient: Caleb SaunasWilliam J Edwards  Procedure(s) Performed: COLONOSCOPY WITH PROPOFOL (N/A ) ESOPHAGOGASTRODUODENOSCOPY (EGD) WITH PROPOFOL  Patient Location: PACU  Anesthesia Type:General  Level of Consciousness: drowsy and patient cooperative  Airway & Oxygen Therapy: Patient Spontanous Breathing and Patient connected to nasal cannula oxygen  Post-op Assessment: Report given to RN and Post -op Vital signs reviewed and stable  Post vital signs: Reviewed and stable  Last Vitals:  Vitals:   08/02/17 0810 08/02/17 0945  BP: 128/65 (!) 92/52  Pulse: (!) 54 86  Resp: 16 (!) 37  Temp: 37.5 C 36.6 C  SpO2: 100% 100%    Last Pain:  Vitals:   08/02/17 0945  TempSrc: Tympanic  PainSc: Asleep         Complications: No apparent anesthesia complications

## 2017-08-02 NOTE — Discharge Planning (Signed)
Patient IV removed.  RN assessment and VS revealed stability for DC to home.  Discharge papers given, explained and educated.  Informed of suggested FU appts and appts made. [facility] asked to have patient contact them to set up - patient agreed.  No scripts needed at this time.  When patient ready, will be wheeled to front and family transporting home via car.

## 2017-08-02 NOTE — Anesthesia Preprocedure Evaluation (Signed)
Anesthesia Evaluation  Patient identified by MRN, date of birth, ID band Patient awake    Reviewed: Allergy & Precautions, NPO status , Patient's Chart, lab work & pertinent test results  History of Anesthesia Complications Negative for: history of anesthetic complications  Airway Mallampati: II       Dental  (+) Upper Dentures, Partial Lower   Pulmonary neg sleep apnea, neg COPD, former smoker,           Cardiovascular hypertension, Pt. on medications (-) Past MI and (-) CHF (-) dysrhythmias (-) Valvular Problems/Murmurs     Neuro/Psych neg Seizures    GI/Hepatic Neg liver ROS, PUD, GERD  Medicated and Controlled,  Endo/Other  neg diabetes  Renal/GU negative Renal ROS     Musculoskeletal   Abdominal   Peds  Hematology  (+) anemia ,   Anesthesia Other Findings   Reproductive/Obstetrics                            Anesthesia Physical Anesthesia Plan  ASA: III  Anesthesia Plan: General   Post-op Pain Management:    Induction: Intravenous  PONV Risk Score and Plan: Propofol infusion  Airway Management Planned: Nasal Cannula  Additional Equipment:   Intra-op Plan:   Post-operative Plan:   Informed Consent: I have reviewed the patients History and Physical, chart, labs and discussed the procedure including the risks, benefits and alternatives for the proposed anesthesia with the patient or authorized representative who has indicated his/her understanding and acceptance.     Plan Discussed with:   Anesthesia Plan Comments:         Anesthesia Quick Evaluation

## 2017-08-03 ENCOUNTER — Encounter: Payer: Self-pay | Admitting: Gastroenterology

## 2017-08-03 LAB — BPAM RBC
BLOOD PRODUCT EXPIRATION DATE: 201811232359
Blood Product Expiration Date: 201811212359
Blood Product Expiration Date: 201811212359
Blood Product Expiration Date: 201811222359
ISSUE DATE / TIME: 201810281337
ISSUE DATE / TIME: 201810290305
ISSUE DATE / TIME: 201810281554
UNIT TYPE AND RH: 5100
UNIT TYPE AND RH: 5100
UNIT TYPE AND RH: 5100
Unit Type and Rh: 5100

## 2017-08-03 LAB — TYPE AND SCREEN
ABO/RH(D): O POS
ANTIBODY SCREEN: NEGATIVE
UNIT DIVISION: 0
Unit division: 0
Unit division: 0
Unit division: 0

## 2017-09-20 ENCOUNTER — Ambulatory Visit: Payer: BLUE CROSS/BLUE SHIELD | Attending: Nurse Practitioner | Admitting: Nurse Practitioner

## 2017-09-20 ENCOUNTER — Encounter: Payer: Self-pay | Admitting: Nurse Practitioner

## 2017-09-20 ENCOUNTER — Other Ambulatory Visit: Payer: Self-pay

## 2017-09-20 VITALS — BP 151/84 | HR 65 | Temp 98.5°F | Resp 16 | Ht 72.0 in | Wt 172.0 lb

## 2017-09-20 DIAGNOSIS — Z87891 Personal history of nicotine dependence: Secondary | ICD-10-CM | POA: Diagnosis not present

## 2017-09-20 DIAGNOSIS — G4700 Insomnia, unspecified: Secondary | ICD-10-CM | POA: Insufficient documentation

## 2017-09-20 DIAGNOSIS — M79605 Pain in left leg: Secondary | ICD-10-CM | POA: Diagnosis not present

## 2017-09-20 DIAGNOSIS — M47816 Spondylosis without myelopathy or radiculopathy, lumbar region: Secondary | ICD-10-CM

## 2017-09-20 DIAGNOSIS — G894 Chronic pain syndrome: Secondary | ICD-10-CM | POA: Insufficient documentation

## 2017-09-20 DIAGNOSIS — Z79891 Long term (current) use of opiate analgesic: Secondary | ICD-10-CM | POA: Insufficient documentation

## 2017-09-20 DIAGNOSIS — M542 Cervicalgia: Secondary | ICD-10-CM | POA: Insufficient documentation

## 2017-09-20 DIAGNOSIS — G8929 Other chronic pain: Secondary | ICD-10-CM

## 2017-09-20 DIAGNOSIS — M48061 Spinal stenosis, lumbar region without neurogenic claudication: Secondary | ICD-10-CM | POA: Insufficient documentation

## 2017-09-20 DIAGNOSIS — M5442 Lumbago with sciatica, left side: Secondary | ICD-10-CM | POA: Diagnosis not present

## 2017-09-20 DIAGNOSIS — M479 Spondylosis, unspecified: Secondary | ICD-10-CM | POA: Insufficient documentation

## 2017-09-20 DIAGNOSIS — E559 Vitamin D deficiency, unspecified: Secondary | ICD-10-CM | POA: Diagnosis not present

## 2017-09-20 DIAGNOSIS — Z5181 Encounter for therapeutic drug level monitoring: Secondary | ICD-10-CM | POA: Diagnosis present

## 2017-09-20 MED ORDER — TRAMADOL HCL 50 MG PO TABS: 100.0000 mg | ORAL_TABLET | Freq: Four times a day (QID) | ORAL | 5 refills | Status: DC | PRN

## 2017-09-20 NOTE — Progress Notes (Signed)
Patient's Name: Caleb Edwards  MRN: [id number]  Referring Provider: [facility], [place]: Oct 04, 1951  PCP: [facility], [place]: 09/20/2017  Note by: Vevelyn Francois NP  Service setting: Ambulatory outpatient  Specialty: Interventional Pain Management  Location: [facility] (AMB) Pain Management Facility    Patient type: Established    Primary Reason(s) for Visit: Encounter for prescription drug management. (Level of risk: moderate)  CC: Hip Pain (right) and Neck Pain  HPI  Caleb Edwards is a 66 y.o. year old, male patient, who comes today for a medication management evaluation. 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 His primarily concern today is the Hip Pain (right) and Neck Pain  Pain Assessment: Location: Right Hip Radiating: back of right upper leg Onset: More than a month ago Duration: Chronic pain Quality: (irritating) Severity: 7 /10 (self-reported pain score)  Note: Reported level is compatible with observation. Clinically the patient looks like a 1/10 A 1/10 is viewed as "Mild" and described as nagging, annoying, but not interfering with basic activities of daily living (ADL). Caleb Edwards is able to eat,  bathe, get dressed, do toileting (being able to get on and off the toilet and perform personal hygiene functions), transfer (move in and out of bed or a chair without assistance), and maintain continence (able to control bladder and bowel functions). Physiologic parameters such as blood pressure and heart rate apear wnl. Information on the proper use of the pain scale provided to the patient today. When using our objective Pain Scale, levels between 6 and 10/10 are said to belong in an emergency room, as it progressively worsens from a 6/10, described as severely limiting, requiring emergency care not usually available at an outpatient pain management facility. At a 6/10 level, communication becomes difficult and requires great effort. Assistance to reach the emergency department may be required. Facial flushing and profuse sweating along with potentially dangerous increases in heart rate and blood pressure will be evident. Timing: Constant Modifying factors: nothing  Caleb Edwards was last scheduled for an appointment on 03/24/2017 for medication management. During today's appointment we reviewed Caleb Edwards chronic pain status, as well as his outpatient medication regimen. He admits that the pain is about the same but gets worse with activity. He does continue to work Warehouse manager. He denies any concerns today. He was started on medication for constipation form PCP.  The patient  reports that he does not use drugs. His body mass index is 23.33 kg/m.  Further details on both, my assessment(s), as well as the proposed treatment plan, please see below.  Controlled Substance Pharmacotherapy Assessment REMS (Risk Evaluation and Mitigation Strategy)  Analgesic:Tramadol 50 mg 2 tablets every 6 hours (400 mg/day) MME/day:40 mg/day Landis Martins, RN  09/20/2017  8:46 AM  Sign at close encounter Nursing Pain Medication Assessment:  Safety precautions to be maintained  throughout the outpatient stay will  include: orient to surroundings, keep bed in low position, maintain call bell within reach at all times, provide assistance with transfer out of bed and ambulation.  Medication Inspection Compliance: Mr. Bilodeau did not comply with our request to bring his pills to be counted. He was reminded that bringing the medication bottles, even when empty, is a requirement.  Medication: None brought in. Pill/Patch Count: None available to be counted. Bottle Appearance: No container available. Did not bring bottle(s) to appointment. Filled Date: N/A Last Medication intake:  today   Pharmacokinetics: Liberation and absorption (onset of action): WNL Distribution (time to peak effect): WNL Metabolism and excretion (duration of action): WNL         Pharmacodynamics: Desired effects: Analgesia: Caleb Edwards reports >50% benefit. Functional ability: Patient reports that medication allows him to accomplish basic ADLs Clinically meaningful improvement in function (CMIF): Sustained CMIF goals met Perceived effectiveness: Described as relatively effective, allowing for increase in activities of daily living (ADL) Undesirable effects: Side-effects or Adverse reactions: None reported Monitoring: [place] PMP: Online review of the past 43-monthperiod conducted. Compliant with practice rules and regulations Last UDS on record: Summary  Date Value Ref Range Status  03/24/2017 FINAL  Final    Comment:    ==================================================================== TOXASSURE SELECT 13 (MW) ==================================================================== Test                             Result       Flag       Units Drug Present   Tramadol                       PRESENT   O-Desmethyltramadol            PRESENT   N-Desmethyltramadol            PRESENT    Source of tramadol is a prescription medication.    O-desmethyltramadol and N-desmethyltramadol are expected    metabolites of  tramadol. ==================================================================== Test                      Result    Flag   Units      Ref Range   Creatinine              125              mg/dL      >=20 ==================================================================== Declared Medications:  Medication list was not provided. ==================================================================== For clinical consultation, please call [phone] ====================================================================    UDS interpretation: Compliant          Medication Assessment Form: Reviewed. Patient indicates being compliant with therapy Treatment compliance: Compliant Risk Assessment Profile: Aberrant behavior: See prior evaluations. None observed or detected today Comorbid factors increasing risk of overdose: See prior notes. No additional risks detected today Risk of substance use disorder (SUD): Low Opioid Risk Tool - 09/20/17 0845      Family History of Substance Abuse   Alcohol  Negative    Illegal Drugs  Negative    Rx Drugs  Negative      Personal History of Substance Abuse   Alcohol  Negative    Illegal Drugs  Negative    Rx Drugs  Negative      Age   Age between 186-45years   No      History of Preadolescent Sexual Abuse  History of Preadolescent Sexual Abuse  Negative or Male      Psychological Disease   Psychological Disease  Negative    Depression  Negative      Total Score   Opioid Risk Tool Scoring  0    Opioid Risk Interpretation  Low Risk      ORT Scoring interpretation table:  Score <3 = Low Risk for SUD  Score between 4-7 = Moderate Risk for SUD  Score >8 = High Risk for Opioid Abuse   Risk Mitigation Strategies:  Patient Counseling: Covered Patient-Prescriber Agreement (PPA): Present and active  Notification to other healthcare providers: Done  Pharmacologic Plan: No change in therapy, at this time  Laboratory Chemistry  Inflammation  Markers (CRP: Acute Phase) (ESR: Chronic Phase) Lab Results  Component Value Date   CRP 0.6 12/26/2015   ESRSEDRATE 3 12/26/2015                 Rheumatology Markers No results found for: Elayne Guerin, [facility]              Renal Function Markers Lab Results  Component Value Date   BUN 17 08/01/2017   CREATININE 1.22 08/01/2017   GFRAA >60 08/01/2017   GFRNONAA >60 08/01/2017                 Hepatic Function Markers Lab Results  Component Value Date   AST 21 08/01/2017   ALT 12 (L) 08/01/2017   ALBUMIN 3.1 (L) 08/01/2017   ALKPHOS 41 08/01/2017   LIPASE 22 07/31/2017                 Electrolytes Lab Results  Component Value Date   NA 139 08/01/2017   K 3.9 08/01/2017   CL 108 08/01/2017   CALCIUM 8.3 (L) 08/01/2017   MG 1.9 01/04/2016                 Neuropathy Markers Lab Results  Component Value Date   VITAMINB12 613 12/26/2015   HIV Non Reactive 08/01/2017                 Bone Pathology Markers Lab Results  Component Value Date   VD25OH 24.0 (L) 12/26/2015                 Coagulation Parameters Lab Results  Component Value Date   INR 1.24 01/03/2016   LABPROT 15.8 (H) 01/03/2016   PLT 163 08/01/2017                 Cardiovascular Markers Lab Results  Component Value Date   TROPONINI <0.03 01/02/2016   HGB 9.1 (L) 08/02/2017   HCT 27.7 (L) 08/01/2017                 CA Markers No results found for: CEA, CA125, LABCA2               Note: Lab results reviewed.  Recent Diagnostic Imaging Results  CT HEMATURIA WORKUP CLINICAL DATA:  Gross painless hematuria for 3 weeks. Nephrolithiasis.  EXAM: CT ABDOMEN AND PELVIS WITHOUT AND WITH CONTRAST  TECHNIQUE: Multidetector CT imaging of the abdomen and pelvis was performed following the standard protocol before and following the bolus administration of intravenous contrast.  CONTRAST:  125 mL Isovue 370  COMPARISON:  AP CT on 01/02/2016 and chest CT on  06/14/2012  FINDINGS: Lower Chest: 4 mm pulmonary nodule in peripheral left lower lobe is stable since 2013  exam, consistent with benign postinflammatory etiology.  Hepatobiliary: No hepatic masses identified. Gallbladder is unremarkable.  Pancreas:  No mass or inflammatory changes.  Spleen: Within normal limits in size and appearance.  Adrenals/Urinary Tract: Several calculi are seen in lower poles of both kidneys, largest in the lower pole of the right kidney measuring 6 mm. No evidence of ureteral calculi or hydronephrosis. A few tiny sub-cm renal cysts are seen bilaterally, but no complex cystic or solid renal masses are identified. No masses seen involving ureters or bladder.  Stomach/Bowel: No evidence of obstruction, inflammatory process or abnormal fluid collections. Diffuse colonic diverticulosis is noted, however there is no evidence of diverticulitis. Normal appendix visualized.  Vascular/Lymphatic: No pathologically enlarged lymph nodes. No abdominal aortic aneurysm. Aortic atherosclerosis.  Reproductive:  Stable mildly enlarged prostate.  Other:  None.  Musculoskeletal:  No suspicious bone lesions identified.  IMPRESSION: Small nonobstructing bilateral renal calculi. No evidence of ureteral calculi or hydronephrosis.  No radiographic evidence of urinary tract neoplasm.  Stable mildly enlarged prostate.  Colonic diverticulosis, without radiographic evidence of diverticulitis.  Aortic atherosclerosis.  Electronically Signed   By: Earle Gell M.D.   On: 06/07/2017 15:42  Complexity Note: Imaging results reviewed. Results shared with Mr. Wild, using Layman's terms.                         Meds   Current Outpatient Medications:  .  amLODipine (NORVASC) 2.5 MG tablet, Take 2.5 mg by mouth daily. , Disp: , Rfl:  .  lisinopril-hydrochlorothiazide (PRINZIDE,ZESTORETIC) 20-12.5 MG tablet, Take 1 tablet by mouth daily. , Disp: , Rfl:  .  Multiple  Vitamins-Minerals (MULTIVITAMIN ADULT PO), Take 1 tablet by mouth daily., Disp: , Rfl:  .  pantoprazole (PROTONIX) 40 MG tablet, Take 1 tablet (40 mg total) by mouth 2 (two) times daily before a meal. (Patient taking differently: Take 40 mg by mouth daily. ), Disp: 60 tablet, Rfl: 0 .  simvastatin (ZOCOR) 40 MG tablet, Take 40 mg by mouth daily at 6 PM. , Disp: , Rfl: 0 .  traMADol (ULTRAM) 50 MG tablet, Take 2 tablets (100 mg total) by mouth every 6 (six) hours as needed for severe pain., Disp: 240 tablet, Rfl: 5 .  VIAGRA 100 MG tablet, Take 100 mg by mouth as needed. , Disp: , Rfl: 0  ROS  Constitutional: Denies any fever or chills Gastrointestinal: No reported hemesis, hematochezia, vomiting, or acute GI distress Musculoskeletal: Denies any acute onset joint swelling, redness, loss of ROM, or weakness Neurological: No reported episodes of acute onset apraxia, aphasia, dysarthria, agnosia, amnesia, paralysis, loss of coordination, or loss of consciousness  Allergies  Mr. Haque has No Known Allergies.  PFSH  Drug: Mr. Ayotte  reports that he does not use drugs. Alcohol:  reports that he does not drink alcohol. Tobacco:  reports that he has quit smoking. he has never used smokeless tobacco. Medical:  has a past medical history of Back pain, Blood transfusion without reported diagnosis (12/2015), Hypertension, and Ulcer, stomach peptic, chronic. Surgical: Mr. Salce  has a past surgical history that includes Esophagogastroduodenoscopy (egd) with propofol (N/A, 01/03/2016); Colonoscopy with propofol (N/A, 08/02/2017); and Esophagogastroduodenoscopy (egd) with propofol (08/02/2017). Family: family history includes Diabetes in his mother; Heart disease in his father; Prostate cancer in his brother.  Constitutional Exam  General appearance: Well nourished, well developed, and well hydrated. In no apparent acute distress Vitals:   09/20/17 0841  BP: (!) 151/84  Pulse: 65  Resp: 16  Temp: 98.5  F (36.9 C)  TempSrc: Oral  SpO2: 99%  Weight: 172 lb (78 kg)  Height: 6' (1.829 m)   BMI Assessment: Estimated body mass index is 23.33 kg/m as calculated from the following:   Height as of this encounter: 6' (1.829 m).   Weight as of this encounter: 172 lb (78 kg).  Psych/Mental status: Alert, oriented x 3 (person, place, & time)       Eyes: PERLA Respiratory: No evidence of acute respiratory distress  Cervical Spine Area Exam  Skin & Axial Inspection: No masses, redness, edema, swelling, or associated skin lesions Alignment: Symmetrical Functional ROM: Unrestricted ROM      Stability: No instability detected Muscle Tone/Strength: Functionally intact. No obvious neuro-muscular anomalies detected. Sensory (Neurological): Unimpaired Palpation: No palpable anomalies              Upper Extremity (UE) Exam    Side: Right upper extremity  Side: Left upper extremity  Skin & Extremity Inspection: Skin color, temperature, and hair growth are WNL. No peripheral edema or cyanosis. No masses, redness, swelling, asymmetry, or associated skin lesions. No contractures.  Skin & Extremity Inspection: Skin color, temperature, and hair growth are WNL. No peripheral edema or cyanosis. No masses, redness, swelling, asymmetry, or associated skin lesions. No contractures.  Functional ROM: Unrestricted ROM          Functional ROM: Unrestricted ROM          Muscle Tone/Strength: Functionally intact. No obvious neuro-muscular anomalies detected.  Muscle Tone/Strength: Functionally intact. No obvious neuro-muscular anomalies detected.  Sensory (Neurological): Unimpaired          Sensory (Neurological): Unimpaired          Palpation: No palpable anomalies              Palpation: No palpable anomalies              Specialized Test(s): Deferred         Specialized Test(s): Deferred          Thoracic Spine Area Exam  Skin & Axial Inspection: No masses, redness, or swelling Alignment: Symmetrical Functional  ROM: Unrestricted ROM Stability: No instability detected Muscle Tone/Strength: Functionally intact. No obvious neuro-muscular anomalies detected. Sensory (Neurological): Unimpaired Muscle strength & Tone: No palpable anomalies  Lumbar Spine Area Exam  Skin & Axial Inspection: No masses, redness, or swelling Alignment: Symmetrical Functional ROM: Unrestricted ROM      Stability: No instability detected Muscle Tone/Strength: Functionally intact. No obvious neuro-muscular anomalies detected. Sensory (Neurological): Unimpaired Palpation: Complains of area being tender to palpation       Provocative Tests: Lumbar Hyperextension and rotation test: evaluation deferred today       Lumbar Lateral bending test: evaluation deferred today       Patrick's Maneuver: evaluation deferred today                    Gait & Posture Assessment  Ambulation: Unassisted Gait: Relatively normal for age and body habitus Posture: WNL   Lower Extremity Exam    Side: Right lower extremity  Side: Left lower extremity  Skin & Extremity Inspection: Skin color, temperature, and hair growth are WNL. No peripheral edema or cyanosis. No masses, redness, swelling, asymmetry, or associated skin lesions. No contractures.  Skin & Extremity Inspection: Skin color, temperature, and hair growth are WNL. No peripheral edema or cyanosis. No masses, redness, swelling, asymmetry, or associated skin lesions.  No contractures.  Functional ROM: Unrestricted ROM          Functional ROM: Unrestricted ROM          Muscle Tone/Strength: Functionally intact. No obvious neuro-muscular anomalies detected.  Muscle Tone/Strength: Functionally intact. No obvious neuro-muscular anomalies detected.  Sensory (Neurological): Unimpaired  Sensory (Neurological): Unimpaired  Palpation: No palpable anomalies  Palpation: No palpable anomalies   Assessment  Primary Diagnosis & Pertinent Problem List: The primary encounter diagnosis was Chronic low back  pain (Location of Primary Source of Pain) (Bilateral) (R>L). Diagnoses of Chronic lower extremity pain (Right), Lumbar facet syndrome (Location of Primary Source of Pain) (Bilateral) (R>L), and Chronic pain syndrome were also pertinent to this visit.  Status Diagnosis  Controlled Controlled Controlled 1. Chronic low back pain (Location of Primary Source of Pain) (Bilateral) (R>L)   2. Chronic lower extremity pain (Right)   3. Lumbar facet syndrome (Location of Primary Source of Pain) (Bilateral) (R>L)   4. Chronic pain syndrome     Problems updated and reviewed during this visit: No problems updated. Plan of Care  Pharmacotherapy (Medications Ordered): No orders of the defined types were placed in this encounter. This SmartLink is deprecated. Use AVSMEDLIST instead to display the medication list for a patient. Medications administered today: Jackelyn Knife had no medications administered during this visit. Lab-work, procedure(s), and/or referral(s): No orders of the defined types were placed in this encounter.  Imaging and/or referral(s): None  Interventional therapies: Planned, scheduled, and/or pending:   Not at this time.   Considering:   Diagnosticbilateral lumbar facet block Possiblebilateral lumbar facet radiofrequency ablation. Diagnostic bilateral L4-5 transforaminal epidural steroid injection   Palliative PRN treatment(s):   Diagnostic bilateral lumbar facet block Diagnostic bilateral L4-5 transforaminal epidural steroid injection   Provider-requested follow-up: Return in about 6 months (around 03/21/2018) for MedMgmt with Me Donella Stade Edison Pace).  Future Appointments  Date Time Provider [place]  06/15/2018  2:45 PM [facility] None   Primary Care Physician: [facility], [place] Location: [facility] Note by: Vevelyn Francois NP Date: 09/20/2017; Time: 9:14 AM  Pain Score Disclaimer: We  use the NRS-11 scale. This is a self-reported, subjective measurement of pain severity with only modest accuracy. It is used primarily to identify changes within a particular patient. It must be understood that outpatient pain scales are significantly less accurate that those used for research, where they can be applied under ideal controlled circumstances with minimal exposure to variables. In reality, the score is likely to be a combination of pain intensity and pain affect, where pain affect describes the degree of emotional arousal or changes in action readiness caused by the sensory experience of pain. Factors such as social and work situation, setting, emotional state, anxiety levels, expectation, and prior pain experience may influence pain perception and show large inter-individual differences that may also be affected by time variables.  Patient instructions provided during this appointment: Patient Instructions   ____________________________________________________________________________________________  Medication Rules  Applies to: All patients receiving prescriptions (written or electronic).  Pharmacy of record: Pharmacy where electronic prescriptions will be sent. If written prescriptions are taken to a different pharmacy, please inform the nursing staff. The pharmacy listed in the electronic medical record should be the one where you would like electronic prescriptions to be sent.  Prescription refills: Only during scheduled appointments. Applies to both, written and electronic prescriptions.  NOTE: The following applies primarily to controlled substances (Opioid* Pain Medications).  Patient's responsibilities: 1. Pain Pills: Bring all pain pills to every appointment (except for procedure appointments). 2. Pill Bottles: Bring pills in original pharmacy bottle. Always bring newest bottle. Bring bottle, even if empty. 3. Medication refills: You are responsible for knowing and  keeping track of what medications you need refilled. The day before your appointment, write a list of all prescriptions that need to be refilled. Bring that list to your appointment and give it to the admitting nurse. Prescriptions will be written only during appointments. If you forget a medication, it will not be "Called in", "Faxed", or "electronically sent". You will need to get another appointment to get these prescribed. 4. Prescription Accuracy: You are responsible for carefully inspecting your prescriptions before leaving our office. Have the discharge nurse carefully go over each prescription with you, before taking them home. Make sure that your name is accurately spelled, that your address is correct. Check the name and dose of your medication to make sure it is accurate. Check the number of pills, and the written instructions to make sure they are clear and accurate. Make sure that you are given enough medication to last until your next medication refill appointment. 5. Taking Medication: Take medication as prescribed. Never take more pills than instructed. Never take medication more frequently than prescribed. Taking less pills or less frequently is permitted and encouraged, when it comes to controlled substances (written prescriptions).  6. Inform other Doctors: Always inform, all of your healthcare providers, of all the medications you take. 7. Pain Medication from other Providers: You are not allowed to accept any additional pain medication from any other Doctor or Healthcare provider. There are two exceptions to this rule. (see below) In the event that you require additional pain medication, you are responsible for notifying us, as stated below. 8. Medication Agreement: You are responsible for carefully reading and following our Medication Agreement. This must be signed before receiving any prescriptions from our practice. Safely store a copy of your signed Agreement. Violations to the  Agreement will result in no further prescriptions. (Additional copies of our Medication Agreement are available upon request.) 9. Laws, Rules, & Regulations: All patients are expected to follow all Federal and [employer], [employer], Rules, [employer]. Ignorance of the Laws does not constitute a valid excuse. The use of any illegal substances is prohibited. 10. Adopted CDC guidelines & recommendations: Target dosing levels will be at or below 60 MME/day. Use of benzodiazepines** is not recommended.  Exceptions: There are only two exceptions to the rule of not receiving pain medications from other Healthcare Providers. 1. Exception #1 (Emergencies): In the event of an emergency (i.e.: accident requiring emergency care), you are allowed to receive additional pain medication. However, you are responsible for: As soon as you are able, call our office [phone], at any time of the day or night, and leave a message stating your name, the date and nature of the emergency, and the name and dose of the medication prescribed. In the event that your call is answered by a member of our staff, make sure to document and save the date, time, and the name of the person that took your information.  2. Exception #2 (Planned Surgery): In the event that you are scheduled by another doctor or dentist to have any type of surgery or procedure, you are allowed (for a period no longer than 30 days), to receive additional pain medication, for the acute post-op pain. However, in this case, you are responsible  for picking up a copy of our "Post-op Pain Management for Surgeons" handout, and giving it to your surgeon or dentist. This document is available at our office, and does not require an appointment to obtain it. Simply go to our office during business hours (Monday-Thursday from 8:00 AM to 4:00 PM) (Friday 8:00 AM to 12:00 Noon) or if you have a scheduled appointment with [place], prior to your surgery, and ask for it by name. In  addition, you will need to provide [place] with your name, name of your surgeon, type of surgery, and date of procedure or surgery.  *Opioid medications include: morphine, codeine, oxycodone, oxymorphone, hydrocodone, hydromorphone, meperidine, tramadol, tapentadol, buprenorphine, fentanyl, methadone. **Benzodiazepine medications include: diazepam (Valium), alprazolam (Xanax), clonazepam (Klonopine), lorazepam (Ativan), clorazepate (Tranxene), chlordiazepoxide (Librium), estazolam (Prosom), oxazepam (Serax), temazepam (Restoril), triazolam (Halcion)  ____________________________________________________________________________________________

## 2017-09-20 NOTE — Patient Instructions (Addendum)
____________________________________________________________________________________________  Medication Rules  Applies to: All patients receiving prescriptions (written or electronic).  Pharmacy of record: Pharmacy where electronic prescriptions will be sent. If written prescriptions are taken to a different pharmacy, please inform the nursing staff. The pharmacy listed in the electronic medical record should be the one where you would like electronic prescriptions to be sent.  Prescription refills: Only during scheduled appointments. Applies to both, written and electronic prescriptions.  NOTE: The following applies primarily to controlled substances (Opioid* Pain Medications).   Patient's responsibilities: 1. Pain Pills: Bring all pain pills to every appointment (except for procedure appointments). 2. Pill Bottles: Bring pills in original pharmacy bottle. Always bring newest bottle. Bring bottle, even if empty. 3. Medication refills: You are responsible for knowing and keeping track of what medications you need refilled. The day before your appointment, write a list of all prescriptions that need to be refilled. Bring that list to your appointment and give it to the admitting nurse. Prescriptions will be written only during appointments. If you forget a medication, it will not be "Called in", "Faxed", or "electronically sent". You will need to get another appointment to get these prescribed. 4. Prescription Accuracy: You are responsible for carefully inspecting your prescriptions before leaving our office. Have the discharge nurse carefully go over each prescription with you, before taking them home. Make sure that your name is accurately spelled, that your address is correct. Check the name and dose of your medication to make sure it is accurate. Check the number of pills, and the written instructions to make sure they are clear and accurate. Make sure that you are given enough medication to  last until your next medication refill appointment. 5. Taking Medication: Take medication as prescribed. Never take more pills than instructed. Never take medication more frequently than prescribed. Taking less pills or less frequently is permitted and encouraged, when it comes to controlled substances (written prescriptions).  6. Inform other Doctors: Always inform, all of your healthcare providers, of all the medications you take. 7. Pain Medication from other Providers: You are not allowed to accept any additional pain medication from any other Doctor or Healthcare provider. There are two exceptions to this rule. (see below) In the event that you require additional pain medication, you are responsible for notifying us, as stated below. 8. Medication Agreement: You are responsible for carefully reading and following our Medication Agreement. This must be signed before receiving any prescriptions from our practice. Safely store a copy of your signed Agreement. Violations to the Agreement will result in no further prescriptions. (Additional copies of our Medication Agreement are available upon request.) 9. Laws, Rules, & Regulations: All patients are expected to follow all Federal and State Laws, Statutes, Rules, & Regulations. Ignorance of the Laws does not constitute a valid excuse. The use of any illegal substances is prohibited. 10. Adopted CDC guidelines & recommendations: Target dosing levels will be at or below 60 MME/day. Use of benzodiazepines** is not recommended.  Exceptions: There are only two exceptions to the rule of not receiving pain medications from other Healthcare Providers. 1. Exception #1 (Emergencies): In the event of an emergency (i.e.: accident requiring emergency care), you are allowed to receive additional pain medication. However, you are responsible for: As soon as you are able, call our office [phone], at any time of the day or night, and leave a message stating your  name, the date and nature of the emergency, and the name and dose of the medication   prescribed. In the event that your call is answered by a member of our staff, make sure to document and save the date, time, and the name of the person that took your information.  2. Exception #2 (Planned Surgery): In the event that you are scheduled by another doctor or dentist to have any type of surgery or procedure, you are allowed (for a period no longer than 30 days), to receive additional pain medication, for the acute post-op pain. However, in this case, you are responsible for picking up a copy of our "Post-op Pain Management for Surgeons" handout, and giving it to your surgeon or dentist. This document is available at our office, and does not require an appointment to obtain it. Simply go to our office during business hours (Monday-Thursday from 8:00 AM to 4:00 PM) (Friday 8:00 AM to 12:00 Noon) or if you have a scheduled appointment with us, prior to your surgery, and ask for it by name. In addition, you will need to provide us with your name, name of your surgeon, type of surgery, and date of procedure or surgery.  *Opioid medications include: morphine, codeine, oxycodone, oxymorphone, hydrocodone, hydromorphone, meperidine, tramadol, tapentadol, buprenorphine, fentanyl, methadone. **Benzodiazepine medications include: diazepam (Valium), alprazolam (Xanax), clonazepam (Klonopine), lorazepam (Ativan), clorazepate (Tranxene), chlordiazepoxide (Librium), estazolam (Prosom), oxazepam (Serax), temazepam (Restoril), triazolam (Halcion)  ____________________________________________________________________________________________  ____________________________________________________________________________________________  Pain Scale  Introduction: The pain score used by this practice is the Verbal Numerical Rating Scale (VNRS-11). This is an 11-point scale. It is for adults and children 10 years or older. There are  significant differences in how the pain score is reported, used, and applied. Forget everything you learned in the past and learn this scoring system.  General Information: The scale should reflect your current level of pain. Unless you are specifically asked for the level of your worst pain, or your average pain. If you are asked for one of these two, then it should be understood that it is over the past 24 hours.  Basic Activities of Daily Living (ADL): Personal hygiene, dressing, eating, transferring, and using restroom.  Instructions: Most patients tend to report their level of pain as a combination of two factors, their physical pain and their psychosocial pain. This last one is also known as "suffering" and it is reflection of how physical pain affects you socially and psychologically. From now on, report them separately. From this point on, when asked to report your pain level, report only your physical pain. Use the following table for reference.  Pain Clinic Pain Levels (0-5/10)  Pain Level Score  Description  No Pain 0   Mild pain 1 Nagging, annoying, but does not interfere with basic activities of daily living (ADL). Patients are able to eat, bathe, get dressed, toileting (being able to get on and off the toilet and perform personal hygiene functions), transfer (move in and out of bed or a chair without assistance), and maintain continence (able to control bladder and bowel functions). Blood pressure and heart rate are unaffected. A normal heart rate for a healthy adult ranges from 60 to 100 bpm (beats per minute).   Mild to moderate pain 2 Noticeable and distracting. Impossible to hide from other people. More frequent flare-ups. Still possible to adapt and function close to normal. It can be very annoying and may have occasional stronger flare-ups. With discipline, patients may get used to it and adapt.   Moderate pain 3 Interferes significantly with activities of daily living (ADL). It  becomes difficult   to feed, bathe, get dressed, get on and off the toilet or to perform personal hygiene functions. Difficult to get in and out of bed or a chair without assistance. Very distracting. With effort, it can be ignored when deeply involved in activities.   Moderately severe pain 4 Impossible to ignore for more than a few minutes. With effort, patients may still be able to manage work or participate in some social activities. Very difficult to concentrate. Signs of autonomic nervous system discharge are evident: dilated pupils (mydriasis); mild sweating (diaphoresis); sleep interference. Heart rate becomes elevated (>115 bpm). Diastolic blood pressure (lower number) rises above 100 mmHg. Patients find relief in laying down and not moving.   Severe pain 5 Intense and extremely unpleasant. Associated with frowning face and frequent crying. Pain overwhelms the senses.  Ability to do any activity or maintain social relationships becomes significantly limited. Conversation becomes difficult. Pacing back and forth is common, as getting into a comfortable position is nearly impossible. Pain wakes you up from deep sleep. Physical signs will be obvious: pupillary dilation; increased sweating; goosebumps; brisk reflexes; cold, clammy hands and feet; nausea, vomiting or dry heaves; loss of appetite; significant sleep disturbance with inability to fall asleep or to remain asleep. When persistent, significant weight loss is observed due to the complete loss of appetite and sleep deprivation.  Blood pressure and heart rate becomes significantly elevated. Caution: If elevated blood pressure triggers a pounding headache associated with blurred vision, then the patient should immediately seek attention at an urgent or emergency care unit, as these may be signs of an impending stroke.    Emergency Department Pain Levels (6-10/10)  Emergency Room Pain 6 Severely limiting. Requires emergency care and should not be  seen or managed at an outpatient pain management facility. Communication becomes difficult and requires great effort. Assistance to reach the emergency department may be required. Facial flushing and profuse sweating along with potentially dangerous increases in heart rate and blood pressure will be evident.   Distressing pain 7 Self-care is very difficult. Assistance is required to transport, or use restroom. Assistance to reach the emergency department will be required. Tasks requiring coordination, such as bathing and getting dressed become very difficult.   Disabling pain 8 Self-care is no longer possible. At this level, pain is disabling. The individual is unable to do even the most "basic" activities such as walking, eating, bathing, dressing, transferring to a bed, or toileting. Fine motor skills are lost. It is difficult to think clearly.   Incapacitating pain 9 Pain becomes incapacitating. Thought processing is no longer possible. Difficult to remember your own name. Control of movement and coordination are lost.   The worst pain imaginable 10 At this level, most patients pass out from pain. When this level is reached, collapse of the autonomic nervous system occurs, leading to a sudden drop in blood pressure and heart rate. This in turn results in a temporary and dramatic drop in blood flow to the brain, leading to a loss of consciousness. Fainting is one of the body's self defense mechanisms. Passing out puts the brain in a calmed state and causes it to shut down for a while, in order to begin the healing process.    Summary: 1. Refer to this scale when providing us with your pain level. 2. Be accurate and careful when reporting your pain level. This will help with your care. 3. Over-reporting your pain level will lead to loss of credibility. 4. Even a level of   1/10 means that there is pain and will be treated at our facility. 5. High, inaccurate reporting will be documented as "Symptom  Exaggeration", leading to loss of credibility and suspicions of possible secondary gains such as obtaining more narcotics, or wanting to appear disabled, for fraudulent reasons. 6. Only pain levels of 5 or below will be seen at our facility. 7. Pain levels of 6 and above will be sent to the Emergency Department and the appointment cancelled. ____________________________________________________________________________________________  Tramadol prescription given

## 2017-09-20 NOTE — Progress Notes (Signed)
Nursing Pain Medication Assessment:  Safety precautions to be maintained throughout the outpatient stay will include: orient to surroundings, keep bed in low position, maintain call bell within reach at all times, provide assistance with transfer out of bed and ambulation.  Medication Inspection Compliance: [name] did not comply with our request to bring his pills to be counted. He was reminded that bringing the medication bottles, even when empty, is a requirement.  Medication: None brought in. Pill/Patch Count: None available to be counted. Bottle Appearance: No container available. Did not bring bottle(s) to appointment. Filled Date: N/A Last Medication intake:  today

## 2017-12-08 ENCOUNTER — Telehealth: Payer: Self-pay | Admitting: *Deleted

## 2017-12-08 NOTE — Telephone Encounter (Signed)
Received referral for low dose lung cancer screening CT scan. Message left at phone number listed in EMR for patient to call me back to facilitate scheduling scan.  

## 2017-12-08 NOTE — Telephone Encounter (Signed)
Received referral for initial lung cancer screening scan. Contacted patient and obtained smoking history, which includes a quit date of 1995. Explained that lung screening is only for people with risk factors of being a current smoker or having smoked in the past 15 years. Patient verbalizes understanding.

## 2018-01-18 ENCOUNTER — Telehealth: Payer: Self-pay | Admitting: *Deleted

## 2018-01-18 NOTE — Telephone Encounter (Signed)
Patient notified per voicemail that ok to transfer scripts to [facility].

## 2018-01-19 NOTE — Telephone Encounter (Signed)
Call to [facility] pharmacy to verify amount of refills left on Tramadol.  Refills x 2, last fill on 12/25/17  [facility] to give information for transfer.  Tramadol 50 mg 2 tab po q 6 hr prn severe pain.  Qty 240 with 1 refill, per patient request.

## 2018-01-23 ENCOUNTER — Observation Stay
Admission: EM | Admit: 2018-01-23 | Discharge: 2018-01-24 | Disposition: A | Discharge status: Home / Self Care | Payer: Medicare HMO | Attending: Internal Medicine | Admitting: Internal Medicine

## 2018-01-23 ENCOUNTER — Emergency Department: Payer: Medicare HMO

## 2018-01-23 ENCOUNTER — Encounter: Payer: Self-pay | Admitting: Emergency Medicine

## 2018-01-23 ENCOUNTER — Other Ambulatory Visit: Payer: Self-pay

## 2018-01-23 DIAGNOSIS — K277 Chronic peptic ulcer, site unspecified, without hemorrhage or perforation: Secondary | ICD-10-CM | POA: Diagnosis not present

## 2018-01-23 DIAGNOSIS — K5731 Diverticulosis of large intestine without perforation or abscess with bleeding: Principal | ICD-10-CM | POA: Insufficient documentation

## 2018-01-23 DIAGNOSIS — G894 Chronic pain syndrome: Secondary | ICD-10-CM | POA: Insufficient documentation

## 2018-01-23 DIAGNOSIS — Z87891 Personal history of nicotine dependence: Secondary | ICD-10-CM | POA: Insufficient documentation

## 2018-01-23 DIAGNOSIS — K625 Hemorrhage of anus and rectum: Secondary | ICD-10-CM | POA: Diagnosis present

## 2018-01-23 DIAGNOSIS — E860 Dehydration: Secondary | ICD-10-CM | POA: Diagnosis not present

## 2018-01-23 DIAGNOSIS — D649 Anemia, unspecified: Secondary | ICD-10-CM | POA: Diagnosis not present

## 2018-01-23 DIAGNOSIS — I10 Essential (primary) hypertension: Secondary | ICD-10-CM | POA: Diagnosis not present

## 2018-01-23 DIAGNOSIS — Z79891 Long term (current) use of opiate analgesic: Secondary | ICD-10-CM | POA: Insufficient documentation

## 2018-01-23 DIAGNOSIS — Z79899 Other long term (current) drug therapy: Secondary | ICD-10-CM | POA: Diagnosis not present

## 2018-01-23 LAB — COMPREHENSIVE METABOLIC PANEL
ALK PHOS: 63 U/L (ref 38–126)
ALT: 13 U/L — AB (ref 17–63)
AST: 21 U/L (ref 15–41)
Albumin: 3.9 g/dL (ref 3.5–5.0)
Anion gap: 7 (ref 5–15)
BUN: 23 mg/dL — ABNORMAL HIGH (ref 6–20)
CALCIUM: 9.3 mg/dL (ref 8.9–10.3)
CO2: 29 mmol/L (ref 22–32)
CREATININE: 1.32 mg/dL — AB (ref 0.61–1.24)
Chloride: 100 mmol/L — ABNORMAL LOW (ref 101–111)
GFR, EST NON AFRICAN AMERICAN: 55 mL/min — AB (ref >60)
Glucose, Bld: 137 mg/dL — ABNORMAL HIGH (ref 65–99)
Potassium: 4.1 mmol/L (ref 3.5–5.1)
Sodium: 136 mmol/L (ref 135–145)
Total Bilirubin: 0.6 mg/dL (ref 0.3–1.2)
Total Protein: 6.9 g/dL (ref 6.5–8.1)

## 2018-01-23 LAB — URINALYSIS, COMPLETE (UACMP) WITH MICROSCOPIC
BILIRUBIN URINE: NEGATIVE
GLUCOSE, UA: NEGATIVE mg/dL
KETONES UR: NEGATIVE mg/dL
NITRITE: NEGATIVE
PH: 6 (ref 5.0–8.0)
Protein, ur: NEGATIVE mg/dL
SPECIFIC GRAVITY, URINE: 1.018 (ref 1.005–1.030)

## 2018-01-23 LAB — HEMOGLOBIN
HEMOGLOBIN: 9.6 g/dL — AB (ref 13.0–18.0)
Hemoglobin: 11.3 g/dL — ABNORMAL LOW (ref 13.0–18.0)

## 2018-01-23 LAB — CBC
HCT: 34.3 % — ABNORMAL LOW (ref 40.0–52.0)
Hemoglobin: 11.5 g/dL — ABNORMAL LOW (ref 13.0–18.0)
MCH: 29.9 pg (ref 26.0–34.0)
MCHC: 33.5 g/dL (ref 32.0–36.0)
MCV: 89.4 fL (ref 80.0–100.0)
PLATELETS: 266 10*3/uL (ref 150–440)
RBC: 3.84 MIL/uL — AB (ref 4.40–5.90)
RDW: 13.7 % (ref 11.5–14.5)
WBC: 11.9 10*3/uL — AB (ref 3.8–10.6)

## 2018-01-23 LAB — TYPE AND SCREEN
ABO/RH(D): O POS
ANTIBODY SCREEN: NEGATIVE

## 2018-01-23 LAB — LIPASE, BLOOD: LIPASE: 24 U/L (ref 11–51)

## 2018-01-23 MED ORDER — AMLODIPINE BESYLATE 10 MG PO TABS
10.0000 mg | ORAL_TABLET | Freq: Every day | ORAL | Status: DC
Start: 2018-01-23 — End: 2018-01-24
  Administered 2018-01-23 – 2018-01-24 (×2): 10 mg via ORAL
  Filled 2018-01-23 (×2): qty 1

## 2018-01-23 MED ORDER — IOPAMIDOL (ISOVUE-300) INJECTION 61%
30.0000 mL | Freq: Once | INTRAVENOUS | Status: AC
  Administered 2018-01-23: 30 mL via ORAL

## 2018-01-23 MED ORDER — BISACODYL 5 MG PO TBEC: 5.0000 mg | DELAYED_RELEASE_TABLET | Freq: Every day | ORAL | Status: DC | PRN

## 2018-01-23 MED ORDER — ATORVASTATIN CALCIUM 20 MG PO TABS
20.0000 mg | ORAL_TABLET | Freq: Every day | ORAL | Status: DC
  Administered 2018-01-23: 20 mg via ORAL
  Filled 2018-01-23: qty 1

## 2018-01-23 MED ORDER — IOPAMIDOL (ISOVUE-300) INJECTION 61%
100.0000 mL | Freq: Once | INTRAVENOUS | Status: AC | PRN
  Administered 2018-01-23: 100 mL via INTRAVENOUS

## 2018-01-23 MED ORDER — SENNOSIDES-DOCUSATE SODIUM 8.6-50 MG PO TABS: 1.0000 | ORAL_TABLET | Freq: Every evening | ORAL | Status: DC | PRN

## 2018-01-23 MED ORDER — ALBUTEROL SULFATE (2.5 MG/3ML) 0.083% IN NEBU: 2.5000 mg | INHALATION_SOLUTION | RESPIRATORY_TRACT | Status: DC | PRN

## 2018-01-23 MED ORDER — ONDANSETRON HCL 4 MG/2ML IJ SOLN: 4.0000 mg | Freq: Four times a day (QID) | INTRAMUSCULAR | Status: DC | PRN

## 2018-01-23 MED ORDER — PANTOPRAZOLE SODIUM 40 MG PO TBEC
40.0000 mg | DELAYED_RELEASE_TABLET | Freq: Two times a day (BID) | ORAL | Status: DC
  Administered 2018-01-23 – 2018-01-24 (×2): 40 mg via ORAL
  Filled 2018-01-23 (×3): qty 1

## 2018-01-23 MED ORDER — ACETAMINOPHEN 650 MG RE SUPP: 650.0000 mg | Freq: Four times a day (QID) | RECTAL | Status: DC | PRN

## 2018-01-23 MED ORDER — SODIUM CHLORIDE 0.9 % IV SOLN
INTRAVENOUS | Status: DC
  Administered 2018-01-23 – 2018-01-24 (×2): via INTRAVENOUS

## 2018-01-23 MED ORDER — HYDROCODONE-ACETAMINOPHEN 5-325 MG PO TABS
1.0000 | ORAL_TABLET | ORAL | Status: DC | PRN
Start: 2018-01-23 — End: 2018-01-24
  Administered 2018-01-23: 1 via ORAL
  Administered 2018-01-23: 2 via ORAL
  Filled 2018-01-23: qty 2
  Filled 2018-01-23: qty 1

## 2018-01-23 MED ORDER — ACETAMINOPHEN 325 MG PO TABS: 650.0000 mg | ORAL_TABLET | Freq: Four times a day (QID) | ORAL | Status: DC | PRN

## 2018-01-23 MED ORDER — ONDANSETRON HCL 4 MG PO TABS: 4.0000 mg | ORAL_TABLET | Freq: Four times a day (QID) | ORAL | Status: DC | PRN

## 2018-01-23 NOTE — H&P (Signed)
[facility] [place] [facility]   PATIENT NAME: Caleb Edwards    MR#:  [id number]  DATE OF BIRTH:  January 09, 1951  DATE OF ADMISSION:  01/23/2018  PRIMARY CARE PHYSICIAN: [facility]   REQUESTING/REFERRING PHYSICIAN: Dr. Juliette Alcide.  CHIEF COMPLAINT:   Chief Complaint  Patient presents with  . Rectal Bleeding   Rectal bleeding 2 days. HISTORY OF PRESENT ILLNESS:  Sasan Wilkie  is a 67 y.o. male with a known history of hypertension, stomach ulcer and multiple blood transfusion in the past.  The patient presented to ED with rectal bleeding multiple times for 2 days.  He complains of mild abdominal pain but denies any other symptoms.  Hemoglobin is 11.5.  Dr. Juliette Alcide requests admission.  PAST MEDICAL HISTORY:   Past Medical History:  Diagnosis Date  . Back pain   . Blood transfusion without reported diagnosis 12/2015   bleeding ulcer hospitalization.   . Hypertension   . Ulcer, stomach peptic, chronic     PAST SURGICAL HISTORY:   Past Surgical History:  Procedure Laterality Date  . COLONOSCOPY WITH PROPOFOL N/A 08/02/2017   Procedure: COLONOSCOPY WITH PROPOFOL;  Surgeon: Midge Minium, MD;  Location: [facility] ENDOSCOPY;  Service: Endoscopy;  Laterality: N/A;  . ESOPHAGOGASTRODUODENOSCOPY (EGD) WITH PROPOFOL N/A 01/03/2016   Procedure: ESOPHAGOGASTRODUODENOSCOPY (EGD) WITH PROPOFOL;  Surgeon: Scot Jun, MD;  Location: [facility] ENDOSCOPY;  Service: Endoscopy;  Laterality: N/A;  . ESOPHAGOGASTRODUODENOSCOPY (EGD) WITH PROPOFOL  08/02/2017   Procedure: ESOPHAGOGASTRODUODENOSCOPY (EGD) WITH PROPOFOL;  Surgeon: Midge Minium, MD;  Location: [facility] ENDOSCOPY;  Service: Endoscopy;;    SOCIAL HISTORY:   Social History   Tobacco Use  . Smoking status: Former Games developer  . Smokeless tobacco: Never Used  Substance Use Topics  . Alcohol use: No    Alcohol/week: 0.0 oz    FAMILY HISTORY:   Family History  Problem Relation Age of Onset  . Diabetes  Mother   . Heart disease Father   . Prostate cancer Brother   . Bladder Cancer Neg Hx   . Kidney cancer Neg Hx     DRUG ALLERGIES:  No Known Allergies  REVIEW OF SYSTEMS:   Review of Systems  Constitutional: Negative for chills, fever and malaise/fatigue.  HENT: Negative for sore throat.   Eyes: Negative for blurred vision and double vision.  Respiratory: Negative for cough, hemoptysis, shortness of breath, wheezing and stridor.   Cardiovascular: Negative for chest pain, palpitations, orthopnea and leg swelling.  Gastrointestinal: Positive for abdominal pain and blood in stool. Negative for diarrhea, melena, nausea and vomiting.  Genitourinary: Negative for dysuria, flank pain and hematuria.  Musculoskeletal: Negative for back pain and joint pain.  Skin: Negative for rash.  Neurological: Negative for dizziness, sensory change, focal weakness, seizures, loss of consciousness, weakness and headaches.  Endo/Heme/Allergies: Negative for polydipsia.  Psychiatric/Behavioral: Negative for depression. The patient is not nervous/anxious.     MEDICATIONS AT HOME:   Prior to Admission medications   Medication Sig Start Date End Date Taking? Authorizing Provider  amLODipine (NORVASC) 10 MG tablet Take 1 tablet by mouth daily. 12/31/17  Yes [provider]  atorvastatin (LIPITOR) 20 MG tablet Take 1 tablet by mouth at bedtime. 12/31/17  Yes [provider]  chlorthalidone (HYGROTON) 50 MG tablet Take 1 tablet by mouth daily. 12/31/17  Yes [provider]  lisinopril (PRINIVIL,ZESTRIL) 40 MG tablet Take 1 tablet by mouth daily. 12/31/17  Yes [provider]  pantoprazole (PROTONIX) 40 MG  tablet Take 1 tablet (40 mg total) by mouth 2 (two) times daily before a meal. 01/04/16  Yes Shaune Pollackhen, Kmarion Rawl, MD  traMADol (ULTRAM) 50 MG tablet Take 2 tablets (100 mg total) by mouth every 6 (six) hours as needed for severe pain. 09/22/17 03/21/18 Yes King, Shana Chuterystal M, NP      VITAL  SIGNS:  Blood pressure 114/71, pulse (!) 59, resp. rate 18, height 6' (1.829 m), weight 180 lb (81.6 kg), SpO2 98 %.  PHYSICAL EXAMINATION:  Physical Exam  GENERAL:  67 y.o.-year-old patient lying in the bed with no acute distress.  EYES: Pupils equal, round, reactive to light and accommodation. No scleral icterus. Extraocular muscles intact.  HEENT: Head atraumatic, normocephalic. Oropharynx and nasopharynx clear.  NECK:  Supple, no jugular venous distention. No thyroid enlargement, no tenderness.  LUNGS: Normal breath sounds bilaterally, no wheezing, rales,rhonchi or crepitation. No use of accessory muscles of respiration.  CARDIOVASCULAR: S1, S2 normal. No murmurs, rubs, or gallops.  ABDOMEN: Soft, nontender, nondistended. Bowel sounds present. No organomegaly or mass.  EXTREMITIES: No pedal edema, cyanosis, or clubbing.  NEUROLOGIC: Cranial nerves II through XII are intact. Muscle strength 5/5 in all extremities. Sensation intact. Gait not checked.  PSYCHIATRIC: The patient is alert and oriented x 3.  SKIN: No obvious rash, lesion, or ulcer.   LABORATORY PANEL:   CBC Recent Labs  Lab 01/23/18 0801  WBC 11.9*  HGB 11.5*  HCT 34.3*  PLT 266   ------------------------------------------------------------------------------------------------------------------  Chemistries  Recent Labs  Lab 01/23/18 0801  NA 136  K 4.1  CL 100*  CO2 29  GLUCOSE 137*  BUN 23*  CREATININE 1.32*  CALCIUM 9.3  AST 21  ALT 13*  ALKPHOS 63  BILITOT 0.6   ------------------------------------------------------------------------------------------------------------------  Cardiac Enzymes No results for input(s): TROPONINI in the last 168 hours. ------------------------------------------------------------------------------------------------------------------  RADIOLOGY:  No results found.    IMPRESSION AND PLAN:   Rectal bleeding. The patient will be placed for observation. Clear  liquid diet, n.p.o. after midnight for possible colonoscopy.  GI consult. Follow-up hemoglobin every 6 hours.  Hypertension.  Continue Norvasc but hold chlorthalidone and lisinopril due to dehydration.  Dehydration.  Start normal saline IV and follow-up BMP.   All the records are reviewed and case discussed with ED provider. Management plans discussed with the patient, his wife and they are in agreement.  CODE STATUS: Full code  TOTAL TIME TAKING CARE OF THIS PATIENT: 45 minutes.    Shaune PollackQing Irven Ingalsbe M.D on 01/23/2018 at 11:33 AM  Between 7am to 6pm - Pager - [phone]  After 6pm go to [email or web address] - Scientist, research (life [email or web address])password [email or web address] [email or web address]  [facility] [place] [facility]  Office  [phone]  CC: Primary care physician; [facility]   Note: This dictation was prepared with Nurse, children'sDragon dictation along with smaller Lobbyistphrase technology. Any transcriptional errors that result from this process are unin

## 2018-01-23 NOTE — ED Triage Notes (Signed)
Pt to ed with c/o abd pain and rectal bleeding x 2 days.  Pt states hx of diverticulitis.  Last BM: last night, diarrhea and bright red blood.

## 2018-01-23 NOTE — ED Notes (Signed)
CT notified IV in place

## 2018-01-23 NOTE — ED Provider Notes (Signed)
[facility] Emergency Department Provider Note   ____________________________________________   First MD Initiated Contact with Patient 01/23/18 [phone]     (approximate)  I have reviewed the triage vital signs and the nursing notes.   HISTORY  Chief Complaint Rectal Bleeding    HPI Caleb Edwards is a 67 y.o. male who reports 2 days of rectal bleeding.  He has had diverticulitis with bleeding in the past and had to be transfused.  This time he came in much sooner.  He says he is having diffuse achy abdominal pain is worse with palpation.  He is also feeling weak.  Pain is diffuse.   Past Medical History:  Diagnosis Date  . Back pain   . Blood transfusion without reported diagnosis 12/2015   bleeding ulcer hospitalization.   . Hypertension   . Ulcer, stomach peptic, chronic     Patient Active Problem List   Diagnosis Date Noted  . Blood in stool   . GI bleed 07/31/2017  . Acute blood loss anemia 07/31/2017  . Abdominal pain 07/31/2017  . Near syncope 07/31/2017  . Chronic pain syndrome 09/09/2016  . Vitamin D insufficiency 01/07/2016  . Symptomatic anemia 01/02/2016  . Long term current use of opiate analgesic 09/24/2015  . Long term prescription opiate use 09/24/2015  . Opiate use 09/24/2015  . Encounter for therapeutic drug level monitoring 09/24/2015  . Encounter for chronic pain management 09/24/2015  . Insomnia secondary to chronic pain 09/24/2015  . Osteoarthritis 09/24/2015  . Chronic low back pain (Location of Primary Source of Pain) (Bilateral) (R>L) 09/24/2015  . Lumbar spondylosis 09/24/2015  . Chronic lower extremity pain (Right) 09/24/2015  . 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 Lumbar facet syndrome (Location of Primary Source of Pain) (Bilateral) (R>L) 09/24/2015  . Diffuse myofascial pain syndrome 09/24/2015  . L4-5 disc bulge 09/24/2015  . Lumbar foraminal stenosis (Bilateral) (L4-5) (L>R)  09/24/2015  . Chronic neck pain 09/24/2015  . Cervical spondylosis 09/24/2015    Past Surgical History:  Procedure Laterality Date  . COLONOSCOPY WITH PROPOFOL N/A 08/02/2017   Procedure: COLONOSCOPY WITH PROPOFOL;  Surgeon: Midge Minium, MD;  Location: [facility] ENDOSCOPY;  Service: Endoscopy;  Laterality: N/A;  . ESOPHAGOGASTRODUODENOSCOPY (EGD) WITH PROPOFOL N/A 01/03/2016   Procedure: ESOPHAGOGASTRODUODENOSCOPY (EGD) WITH PROPOFOL;  Surgeon: Scot Jun, MD;  Location: [facility] ENDOSCOPY;  Service: Endoscopy;  Laterality: N/A;  . ESOPHAGOGASTRODUODENOSCOPY (EGD) WITH PROPOFOL  08/02/2017   Procedure: ESOPHAGOGASTRODUODENOSCOPY (EGD) WITH PROPOFOL;  Surgeon: Midge Minium, MD;  Location: [facility] ENDOSCOPY;  Service: Endoscopy;;    Prior to Admission medications   Medication Sig Start Date End Date Taking? Authorizing Provider  amLODipine (NORVASC) 2.5 MG tablet Take 2.5 mg by mouth daily.  11/22/15   [provider]  lisinopril-hydrochlorothiazide (PRINZIDE,ZESTORETIC) 20-12.5 MG tablet Take 1 tablet by mouth daily.  11/22/15   [provider]  Multiple Vitamins-Minerals (MULTIVITAMIN ADULT PO) Take 1 tablet by mouth daily.    [provider]  pantoprazole (PROTONIX) 40 MG tablet Take 1 tablet (40 mg total) by mouth 2 (two) times daily before a meal. Patient taking differently: Take 40 mg by mouth daily.  01/04/16   Shaune Pollack, MD  simvastatin (ZOCOR) 40 MG tablet Take 40 mg by mouth daily at 6 PM.  08/24/16   [provider]  traMADol (ULTRAM) 50 MG tablet Take 2 tablets (100 mg total) by mouth every 6 (six) hours as needed for severe pain. 09/22/17 03/21/18  Thad RangerKing, Crystal M, NP  VIAGRA 100 MG tablet Take 100 mg by mouth as needed.  09/06/16   [provider]    Allergies Patient has no known allergies.  Family History  Problem Relation Age of Onset  . Diabetes Mother   . Heart disease Father   . Prostate cancer Brother   . Bladder Cancer Neg Hx     . Kidney cancer Neg Hx     Social History Social History   Tobacco Use  . Smoking status: Former Games developermoker  . Smokeless tobacco: Never Used  Substance Use Topics  . Alcohol use: No    Alcohol/week: 0.0 oz  . Drug use: No    Review of Systems  Constitutional: No fever/chills Eyes: No visual changes. ENT: No sore throat. Cardiovascular: Denies chest pain. Respiratory: Denies shortness of breath. Gastrointestinal: See HPI Genitourinary: Negative for dysuria. Musculoskeletal: Negative for back pain. Skin: Negative for rash. Neurological: Negative for headaches, focal weakness  ____________________________________________   PHYSICAL EXAM:  VITAL SIGNS: ED Triage Vitals  Enc Vitals Group     BP 01/23/18 1000 117/70     Pulse Rate 01/23/18 1000 64     Resp 01/23/18 1000 16     Temp --      Temp src --      SpO2 01/23/18 1000 99 %     Weight 01/23/18 0758 180 lb (81.6 kg)     Height 01/23/18 0758 6' (1.829 m)     Head Circumference --      Peak Flow --      Pain Score 01/23/18 0758 8     Pain Loc --      Pain Edu? --      Excl. in GC? --     Constitutional: Alert and oriented. Well appearing and in no acute distress. Head: Atraumatic. Nose: No congestion/rhinnorhea. Mouth/Throat: Mucous membranes are moist.  Oropharynx non-erythematous. Neck: No stridor. Cardiovascular: Normal rate, regular rhythm. Grossly normal heart sounds.  Good peripheral circulation. Respiratory: Normal respiratory effort.  No retractions. Lungs CTAB. Gastrointestinal: Soft diffusely tender to palpation percussion no distention. No abdominal bruits. No CVA tenderness. Rectal: There is watery blood on rectal exam.  No masses no hemorrhoids no tenderness }Musculoskeletal: No lower extremity tenderness nor edema.  Neurologic:  Normal speech and language. No gross focal neurologic deficits are appreciated. Skin:  Skin is warm, dry and intact. No rash noted. Psychiatric: Mood and affect are  normal. Speech and behavior are normal.  ____________________________________________   LABS (all labs ordered are listed, but only abnormal results are displayed)  Labs Reviewed  COMPREHENSIVE METABOLIC PANEL - Abnormal; Notable for the following components:      Result Value   Chloride 100 (*)    Glucose, Bld 137 (*)    BUN 23 (*)    Creatinine, Ser 1.32 (*)    ALT 13 (*)    GFR calc non Af Amer 55 (*)    All other components within normal limits  CBC - Abnormal; Notable for the following components:   WBC 11.9 (*)    RBC 3.84 (*)    Hemoglobin 11.5 (*)    HCT 34.3 (*)    All other components within normal limits  LIPASE, BLOOD  URINALYSIS, COMPLETE (UACMP) WITH MICROSCOPIC  TYPE AND SCREEN   ____________________________________________  EKG   ____________________________________________  RADIOLOGY  ED MD interpretation: No apparent explanation for patient's symptoms.  He has bilateral nonobstructing stones.  Official radiology report(s): No  results found.  ____________________________________________   PROCEDURES  Procedure(s) performed:   Procedures  Critical Care performed:  ____________________________________________   INITIAL IMPRESSION / ASSESSMENT AND PLAN / ED COURSE  Patient turned over to Dr. Imogene Burn the hospitalist for admission prior to CT results arrival         ____________________________________________   FINAL CLINICAL IMPRESSION(S) / ED DIAGNOSES  Final diagnoses:  Rectal bleeding     ED Discharge Orders    None       Note:  This document was prepared using Dragon voice recognition software and may include unintentional dictation errors.    Arnaldo Natal, MD 01/23/18 [phone]

## 2018-01-23 NOTE — Progress Notes (Signed)
   01/23/18 1315  Clinical Encounter Type  Visited With Patient  Visit Type Initial  Referral From Nurse  Consult/Referral To Chaplain  Spiritual Encounters  Spiritual Needs Other (Comment)   CH received an OR to educate PT on an AD. PT was given paperwork and was informed to have CH PG when he was ready to complete.

## 2018-01-23 NOTE — Consult Note (Signed)
Wyline Mood , MD [address], [place], [place], [address] [address], [place], [place], [address] Phone: [phone]  Fax: [phone]  Consultation  Referring Provider: Dr Imogene Burn  Primary Care Physician:  [facility] Primary Gastroenterologist:  [facility]          Reason for Consultation:     Rectal bleeding   Date of Admission:  01/23/2018 Date of Consultation:  01/23/2018         HPI:   Caleb Edwards is a 67 y.o. 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 A colonoscopy was seen which showed colonic diverticulosis in 2015.   Dr Servando Snare performed a colonoscopy in 07/2017 and pan colonic diverticulosis was seen , EGD at the same time showed benign duodenal stricture.    On this admission he was admitted yesterday with rectal bleeding of 2 days duration . On admision Hb 11.5 grams His Hb was 9.1 grams 5 months back.   He says he was doing well when all of a sudden 5 days back had an episode of bright red blood per rectum . It stopped and then returned day before yesterday . The episode was associated with left sided abdominal discomfort that was transient . Denies any persistent pain, no pain presently , last bowel movement was yesterday . Feels well now. Says similar to prior episodes.   Hb stable today    CBC Latest Ref Rng & Units 01/23/2018 01/23/2018 08/02/2017  WBC 3.8 - 10.6 K/uL - 11.9(H) -  Hemoglobin 13.0 - 18.0 g/dL 11.3(L) 11.5(L) 9.1(L)  Hematocrit 40.0 - 52.0 % - 34.3(L) -  Platelets 150 - 440 K/uL - 266 -    Past Medical History:  Diagnosis Date  . Back pain   . Blood transfusion without reported diagnosis 12/2015   bleeding ulcer hospitalization.   . Hypertension   . Ulcer, stomach peptic, chronic      Past Surgical History:  Procedure Laterality Date  . COLONOSCOPY WITH PROPOFOL N/A 08/02/2017   Procedure: COLONOSCOPY WITH PROPOFOL;  Surgeon: Midge Minium, MD;  Location: [facility] ENDOSCOPY;  Service: Endoscopy;  Laterality: N/A;  . ESOPHAGOGASTRODUODENOSCOPY (EGD) WITH PROPOFOL N/A 01/03/2016   Procedure: ESOPHAGOGASTRODUODENOSCOPY (EGD) WITH PROPOFOL;  Surgeon: Scot Jun, MD;  Location: [facility] ENDOSCOPY;  Service: Endoscopy;  Laterality: N/A;  . ESOPHAGOGASTRODUODENOSCOPY (EGD) WITH PROPOFOL  08/02/2017   Procedure: ESOPHAGOGASTRODUODENOSCOPY (EGD) WITH PROPOFOL;  Surgeon: Midge Minium, MD;  Location: [facility] ENDOSCOPY;  Service: Endoscopy;;    Prior to Admission medications   Medication Sig Start Date End Date Taking? Authorizing Provider  amLODipine (NORVASC) 10 MG tablet Take 1 tablet by mouth daily. 12/31/17  Yes [provider]  atorvastatin (LIPITOR) 20 MG tablet Take 1 tablet by mouth at bedtime. 12/31/17  Yes [provider]  chlorthalidone (HYGROTON) 50 MG tablet Take 1 tablet by mouth daily. 12/31/17  Yes [provider]  lisinopril (PRINIVIL,ZESTRIL) 40 MG tablet Take 1 tablet by mouth daily. 12/31/17  Yes [provider]  pantoprazole (PROTONIX) 40 MG tablet Take 1 tablet (40 mg total) by mouth 2 (two) times daily before a meal. 01/04/16  Yes Shaune Pollack, MD  traMADol (ULTRAM) 50 MG tablet Take 2 tablets (100 mg total) by mouth  every 6 (six) hours as needed for severe pain. 09/22/17 03/21/18 Yes Barbette MerinoKing, Crystal M, NP    Family History  Problem Relation Age of Onset  . Diabetes Mother   . Heart disease Father   . Prostate cancer Brother   . Bladder Cancer Neg Hx   . Kidney cancer Neg Hx      Social History   Tobacco Use  . Smoking status: Former Games developermoker  . Smokeless tobacco: Never Used  Substance Use Topics  . Alcohol use: No    Alcohol/week: 0.0 oz  . Drug use: No    Allergies as of 01/23/2018  . (No Known Allergies)    Review of  Systems:    All systems reviewed and negative except where noted in HPI.   Physical Exam:  Vital signs in last 24 hours: Temp:  [97.7 F (36.5 C)] 97.7 F (36.5 C) (04/22 1226) Pulse Rate:  [53-64] 53 (04/22 1226) Resp:  [16-18] 18 (04/22 1030) BP: (114-128)/(70-76) 128/76 (04/22 1226) SpO2:  [98 %-99 %] 99 % (04/22 1226) Weight:  [180 lb (81.6 kg)] 180 lb (81.6 kg) (04/22 0758)   General:   Pleasant, cooperative in NAD Head:  Normocephalic and atraumatic. Eyes:   No icterus.   Conjunctiva pink. PERRLA. Ears:  Normal auditory acuity. Neck:  Supple; no masses or thyroidomegaly Lungs: Respirations even and unlabored. Lungs clear to auscultation bilaterally.   No wheezes, crackles, or rhonchi.  Heart:  Regular rate and rhythm;  Without murmur, clicks, rubs or gallops Abdomen:  Soft, nondistended, nontender. Normal bowel sounds. No appreciable masses or hepatomegaly.  No rebound or guarding.  Neurologic:  Alert and oriented x3;  grossly normal neurologically. Skin:  Intact without significant lesions or rashes. Cervical Nodes:  No significant cervical adenopathy. Psych:  Alert and cooperative. Normal affect.  LAB RESULTS: Recent Labs    01/23/18 0801  WBC 11.9*  HGB 11.5*  HCT 34.3*  PLT 266   BMET Recent Labs    01/23/18 0801  NA 136  K 4.1  CL 100*  CO2 29  GLUCOSE 137*  BUN 23*  CREATININE 1.32*  CALCIUM 9.3   LFT Recent Labs    01/23/18 0801  PROT 6.9  ALBUMIN 3.9  AST 21  ALT 13*  ALKPHOS 63  BILITOT 0.6   PT/INR No results for input(s): LABPROT, INR in the last 72 hours.  STUDIES: Ct Abdomen Pelvis W Contrast  Result Date: 01/23/2018 CLINICAL DATA:  Abdominal pain and rectal bleeding for 2 days. Diarrhea. Diverticulosis. Nephrolithiasis. EXAM: CT ABDOMEN AND PELVIS WITH CONTRAST TECHNIQUE: Multidetector CT imaging of the abdomen and pelvis was performed using the standard protocol following bolus administration of intravenous contrast. CONTRAST:   100mL ISOVUE-300 IOPAMIDOL (ISOVUE-300) INJECTION 61% COMPARISON:  06/07/2017 FINDINGS: Lower Chest: No acute findings. Hepatobiliary: No hepatic masses identified. Gallbladder is unremarkable. Pancreas:  No mass or inflammatory changes. Spleen: Within normal limits in size and appearance. Adrenals/Urinary Tract: No masses identified. No evidence of hydronephrosis. Small bilateral nonobstructing renal calculi again seen. Tiny bilateral renal cysts are unchanged. Unremarkable unopacified urinary bladder. Stomach/Bowel: No evidence of obstruction, inflammatory process or abnormal fluid collections. Normal appendix visualized. Diffuse colonic diverticulosis is again seen, however there is no evidence of diverticulitis. Vascular/Lymphatic: No pathologically enlarged lymph nodes. No abdominal aortic aneurysm. Aortic atherosclerosis. Reproductive:  Stable mildly enlarged prostate. Other:  None. Musculoskeletal: No suspicious bone lesions identified. Stable lumbar spondylosis with grade 1 anterolisthesis at L5-S1. IMPRESSION: Colonic diverticulosis, without radiographic evidence of  diverticulitis or other acute findings. Bilateral nonobstructing nephrolithiasis. Stable mildly enlarged prostate. Electronically Signed   By: Myles Rosenthal M.D.   On: 01/23/2018 12:08      Impression / Plan:   Caleb Edwards is a 67 y.o. y/o male with h/o prior rectal bleeding , colonoscopy in 2018 October showed colonic diverticulosis , comes back to the hospital today with rectal bleeding. The presentation is suggestive of a diverticular bleed.   Plan  1. Monitor CBC and transfuse if needed 2. If there is concern for ongoing bleed then get tagged RBC scan  3. If has no further bleeding then advance diet and discharge . Most diverticular bleeds stop on their own.   4. Advance to soft diet   Thank you for involving me in the care of this patient.      LOS: 0 days   Wyline Mood, MD  01/23/2018, 12:53 PM

## 2018-01-24 DIAGNOSIS — K5731 Diverticulosis of large intestine without perforation or abscess with bleeding: Secondary | ICD-10-CM | POA: Diagnosis not present

## 2018-01-24 LAB — HEMOGLOBIN
Hemoglobin: 9.3 g/dL — ABNORMAL LOW (ref 13.0–18.0)
Hemoglobin: 10.5 g/dL — ABNORMAL LOW (ref 13.0–18.0)

## 2018-01-24 LAB — BASIC METABOLIC PANEL
Anion gap: 3 — ABNORMAL LOW (ref 5–15)
BUN: 14 mg/dL (ref 6–20)
CHLORIDE: 103 mmol/L (ref 101–111)
CO2: 31 mmol/L (ref 22–32)
Calcium: 8.1 mg/dL — ABNORMAL LOW (ref 8.9–10.3)
Creatinine, Ser: 1.28 mg/dL — ABNORMAL HIGH (ref 0.61–1.24)
GFR calc Af Amer: >60 mL/min (ref >60)
GFR calc non Af Amer: 57 mL/min — ABNORMAL LOW (ref >60)
GLUCOSE: 124 mg/dL — AB (ref 65–99)
POTASSIUM: 3.8 mmol/L (ref 3.5–5.1)
Sodium: 137 mmol/L (ref 135–145)

## 2018-01-24 NOTE — Discharge Summary (Signed)
[facility] [place] [facility]   PATIENT NAME: Caleb Edwards    MR#:  [id number]  DATE OF BIRTH:  10/20/1950  DATE OF ADMISSION:  01/23/2018 ADMITTING PHYSICIAN: Shaune Pollack, MD  DATE OF DISCHARGE: 01/24/2018  PRIMARY CARE PHYSICIAN: [facility]   ADMISSION DIAGNOSIS:  Rectal bleeding Hypertension Dehydration Chronic peptic ulcer  DISCHARGE DIAGNOSIS:  Diverticular bleed Hypertension Dehydration resolved Chronic peptic ulcer  SECONDARY DIAGNOSIS:   Past Medical History:  Diagnosis Date  . Back pain   . Blood transfusion without reported diagnosis 12/2015   bleeding ulcer hospitalization.   . Hypertension   . Ulcer, stomach peptic, chronic      ADMITTING HISTORY Caleb Edwards  is a 67 y.o. male with a known history of hypertension, stomach ulcer and multiple blood transfusion in the past.  The patient presented to ED with rectal bleeding multiple times for 2 days.  He complains of mild abdominal pain but denies any other symptoms.  Hemoglobin is 11.5.  Dr. Juliette Alcide requests admission.  HOSPITAL COURSE:  67 year old male patient with history of chronic peptic ulcers, hypertension was admitted for rectal bleed.  Patient had this rectal bleed on Sunday.  No new episodes of bleeding on Monday and Tuesday.  Hemoglobin has been stable.  He was hydrated with IV fluids.  Patient was worked up with CT abdomen which showed diverticulosis. Gastroenterology consulted.  According to gastroenterology patient had a diverticular bleed which is self-limited.  Patient's hemoglobin stable and hemodynamically stable. patient will be discharged home outpatient colonoscopy with gastroenterology.  CONSULTS OBTAINED:  Treatment Team:  Wyline Mood, MD  DRUG ALLERGIES:  No Known Allergies  DISCHARGE MEDICATIONS:   Allergies as of 01/24/2018   No Known Allergies     Medication List    TAKE these medications   amLODipine 10 MG tablet Commonly known as:   NORVASC Take 1 tablet by mouth daily.   atorvastatin 20 MG tablet Commonly known as:  LIPITOR Take 1 tablet by mouth at bedtime.   chlorthalidone 50 MG tablet Commonly known as:  HYGROTON Take 1 tablet by mouth daily.   lisinopril 40 MG tablet Commonly known as:  PRINIVIL,ZESTRIL Take 1 tablet by mouth daily.   pantoprazole 40 MG tablet Commonly known as:  PROTONIX Take 1 tablet (40 mg total) by mouth 2 (two) times daily before a meal.   traMADol 50 MG tablet Commonly known as:  ULTRAM Take 2 tablets (100 mg total) by mouth every 6 (six) hours as needed for severe pain.       Today  Patient seen and evaluated today No chest pain No new episodes of rectal bleeding No nausea and vomiting  VITAL SIGNS:  Blood pressure 113/65, pulse 61, temperature 98.5 F (36.9 C), temperature source Oral, resp. rate 15, height 6' (1.829 m), weight 81.6 kg (180 lb), SpO2 100 %.  I/O:    Intake/Output Summary (Last 24 hours) at 01/24/2018 1303 Last data filed at 01/24/2018 1012 Gross per 24 hour  Intake 1546.25 ml  Output -  Net 1546.25 ml    PHYSICAL EXAMINATION:  Physical Exam  GENERAL:  67 y.o.-year-old patient lying in the bed with no acute distress.  LUNGS: Normal breath sounds bilaterally, no wheezing, rales,rhonchi or crepitation. No use of accessory muscles of respiration.  CARDIOVASCULAR: S1, S2 normal. No murmurs, rubs, or gallops.  ABDOMEN: Soft, non-tender, non-distended. Bowel sounds present. No organomegaly or mass.  NEUROLOGIC: Moves all 4 extremities. PSYCHIATRIC: The patient is  alert and oriented x 3.  SKIN: No obvious rash, lesion, or ulcer.   DATA REVIEW:   CBC Recent Labs  Lab 01/23/18 0801  01/24/18 0819  WBC 11.9*  --   --   HGB 11.5*   < > 10.5*  HCT 34.3*  --   --   PLT 266  --   --    < > = values in this interval not displayed.    Chemistries  Recent Labs  Lab 01/23/18 0801 01/24/18 0349  NA 136 137  K 4.1 3.8  CL 100* 103  CO2 29 31   GLUCOSE 137* 124*  BUN 23* 14  CREATININE 1.32* 1.28*  CALCIUM 9.3 8.1*  AST 21  --   ALT 13*  --   ALKPHOS 63  --   BILITOT 0.6  --     Cardiac Enzymes No results for input(s): TROPONINI in the last 168 hours.  Microbiology Results  Results for orders placed or performed in visit on 06/09/17  Microscopic Examination     Status: Abnormal   Collection Time: 06/09/17  1:53 PM  Result Value Ref Range Status   WBC, UA 0-5 0 - 5 /hpf Final   RBC, UA None seen 0 - 2 /hpf Final   Epithelial Cells (non renal) None seen 0 - 10 /hpf Final   Bacteria, UA Few (A) None seen/Few Final    RADIOLOGY:  Ct Abdomen Pelvis W Contrast  Result Date: 01/23/2018 CLINICAL DATA:  Abdominal pain and rectal bleeding for 2 days. Diarrhea. Diverticulosis. Nephrolithiasis. EXAM: CT ABDOMEN AND PELVIS WITH CONTRAST TECHNIQUE: Multidetector CT imaging of the abdomen and pelvis was performed using the standard protocol following bolus administration of intravenous contrast. CONTRAST:  100mL ISOVUE-300 IOPAMIDOL (ISOVUE-300) INJECTION 61% COMPARISON:  06/07/2017 FINDINGS: Lower Chest: No acute findings. Hepatobiliary: No hepatic masses identified. Gallbladder is unremarkable. Pancreas:  No mass or inflammatory changes. Spleen: Within normal limits in size and appearance. Adrenals/Urinary Tract: No masses identified. No evidence of hydronephrosis. Small bilateral nonobstructing renal calculi again seen. Tiny bilateral renal cysts are unchanged. Unremarkable unopacified urinary bladder. Stomach/Bowel: No evidence of obstruction, inflammatory process or abnormal fluid collections. Normal appendix visualized. Diffuse colonic diverticulosis is again seen, however there is no evidence of diverticulitis. Vascular/Lymphatic: No pathologically enlarged lymph nodes. No abdominal aortic aneurysm. Aortic atherosclerosis. Reproductive:  Stable mildly enlarged prostate. Other:  None. Musculoskeletal: No suspicious bone lesions  identified. Stable lumbar spondylosis with grade 1 anterolisthesis at L5-S1. IMPRESSION: Colonic diverticulosis, without radiographic evidence of diverticulitis or other acute findings. Bilateral nonobstructing nephrolithiasis. Stable mildly enlarged prostate. Electronically Signed   By: Myles RosenthalJohn  Stahl M.D.   On: 01/23/2018 12:08    Follow up with PCP in 1 week.  Management plans discussed with the patient, family and they are in agreement.  CODE STATUS: Full code    Code Status Orders  (From admission, onward)        Start     Ordered   01/23/18 1226  Full code  Continuous     01/23/18 1225    Code Status History    Date Active Date Inactive Code Status Order ID Comments User Context   07/31/2017 1326 08/02/2017 1907 Full Code [id number]  Marguarite ArbourSparks, Jeffrey D, MD Inpatient   01/02/2016 1814 01/04/2016 1420 Full Code [id number]  Ramonita LabGouru, Aruna, MD ED      TOTAL TIME TAKING CARE OF THIS PATIENT ON DAY OF DISCHARGE: more than 33 minutes.   Ihor AustinPavan Abhimanyu Cruces M.D on  01/24/2018 at 1:03 PM  Between 7am to 6pm - Pager - [phone]  After 6pm go to [email or web address] - password [email or web address] [facility]  [facility]  Office  [phone]  CC: Primary care physician; [facility] [employer]  Note: This dictation was prepared with Nurse, children's dictation along with smaller Lobbyist. Any transcriptional errors that result from this process are unintentional.

## 2018-01-24 NOTE — Progress Notes (Signed)
Advanced care plan.  Purpose of the Encounter: CODE STATUS Parties in Attendance:Patient Patient's Decision Capacity:Good Subjective/Patient's story: Admitted for rectal bleed Objective/Medical story Has diverticular bleed Goals of care determination:  Advance directives discussed Patient wants every thing done, that is cardiac resuscitation, intubation and ventilator if need arises. CODE STATUS: Full code Time spent discussing advanced care planning: 16 minutes

## 2018-01-27 LAB — HIV ANTIBODY (ROUTINE TESTING W REFLEX): HIV Screen 4th Generation wRfx: NONREACTIVE

## 2018-01-31 ENCOUNTER — Telehealth: Payer: Self-pay | Admitting: *Deleted

## 2018-01-31 NOTE — Telephone Encounter (Signed)
Spoke with pharmacist, insurance only approved 7 days of Tramadol. If we get PA approval, they can fill the remainder of the script. Will submit PA request. Patient notified.

## 2018-02-02 ENCOUNTER — Telehealth: Payer: Self-pay | Admitting: *Deleted

## 2018-02-02 NOTE — Telephone Encounter (Signed)
Spoke with Leonette Most at [facility] on [address] Rx transferred from [facility];   Tramadol 50 mg  Sig 100 mg q 6 hours prn severe pain qty 240 refills x 1  To fill on 02/07/18.   Called patient to let him know what has been done for Tramadol and that he can fill again on 02/07/18.  I did discuss with him that the next fill should be for a 30 day fill and that he would need to make sure that he takes his medicine appropriately as directed and we would see him back at his next appt.

## 2018-02-02 NOTE — Telephone Encounter (Signed)
Patient called in to discuss Tramadol Rx.  [facility] weeks [facility] and was waiting on PA which was sent on 01/31/18.  [facility] states that [facility] told [facility] they could only dispense 7 days at a time of this type medication and patient would like to have his Rx switched over to [place] on [address].  He had switched d/t them not taking his [employer]. I told him that I would communicate with [facility] and try and get this switched back for him and would touch base later with him to let him know what I have done.

## 2018-02-03 ENCOUNTER — Encounter: Payer: Self-pay | Admitting: Emergency Medicine

## 2018-02-03 ENCOUNTER — Inpatient Hospital Stay
Admission: EM | Admit: 2018-02-03 | Discharge: 2018-02-06 | DRG: 378 | Disposition: A | Discharge status: Home / Self Care | Payer: Medicare HMO | Attending: Internal Medicine | Admitting: Internal Medicine

## 2018-02-03 DIAGNOSIS — K640 First degree hemorrhoids: Secondary | ICD-10-CM | POA: Diagnosis present

## 2018-02-03 DIAGNOSIS — I129 Hypertensive chronic kidney disease with stage 1 through stage 4 chronic kidney disease, or unspecified chronic kidney disease: Secondary | ICD-10-CM | POA: Diagnosis present

## 2018-02-03 DIAGNOSIS — K5791 Diverticulosis of intestine, part unspecified, without perforation or abscess with bleeding: Secondary | ICD-10-CM | POA: Diagnosis present

## 2018-02-03 DIAGNOSIS — Z87891 Personal history of nicotine dependence: Secondary | ICD-10-CM | POA: Diagnosis not present

## 2018-02-03 DIAGNOSIS — N183 Chronic kidney disease, stage 3 unspecified: Secondary | ICD-10-CM | POA: Diagnosis present

## 2018-02-03 DIAGNOSIS — K315 Obstruction of duodenum: Secondary | ICD-10-CM | POA: Diagnosis present

## 2018-02-03 DIAGNOSIS — D62 Acute posthemorrhagic anemia: Secondary | ICD-10-CM | POA: Diagnosis present

## 2018-02-03 DIAGNOSIS — K922 Gastrointestinal hemorrhage, unspecified: Secondary | ICD-10-CM | POA: Diagnosis not present

## 2018-02-03 DIAGNOSIS — Z79899 Other long term (current) drug therapy: Secondary | ICD-10-CM | POA: Diagnosis not present

## 2018-02-03 DIAGNOSIS — I10 Essential (primary) hypertension: Secondary | ICD-10-CM | POA: Diagnosis present

## 2018-02-03 DIAGNOSIS — D649 Anemia, unspecified: Secondary | ICD-10-CM

## 2018-02-03 DIAGNOSIS — K5730 Diverticulosis of large intestine without perforation or abscess without bleeding: Secondary | ICD-10-CM | POA: Diagnosis not present

## 2018-02-03 LAB — COMPREHENSIVE METABOLIC PANEL
ALT: 16 U/L — AB (ref 17–63)
AST: 21 U/L (ref 15–41)
Albumin: 3.8 g/dL (ref 3.5–5.0)
Alkaline Phosphatase: 65 U/L (ref 38–126)
Anion gap: 6 (ref 5–15)
BILIRUBIN TOTAL: 0.3 mg/dL (ref 0.3–1.2)
BUN: 11 mg/dL (ref 6–20)
CO2: 27 mmol/L (ref 22–32)
CREATININE: 1.34 mg/dL — AB (ref 0.61–1.24)
Calcium: 8.6 mg/dL — ABNORMAL LOW (ref 8.9–10.3)
Chloride: 104 mmol/L (ref 101–111)
GFR, EST NON AFRICAN AMERICAN: 54 mL/min — AB (ref >60)
Glucose, Bld: 92 mg/dL (ref 65–99)
Potassium: 3.6 mmol/L (ref 3.5–5.1)
Sodium: 137 mmol/L (ref 135–145)
Total Protein: 6.9 g/dL (ref 6.5–8.1)

## 2018-02-03 LAB — CBC
HCT: 25 % — ABNORMAL LOW (ref 40.0–52.0)
Hemoglobin: 8.1 g/dL — ABNORMAL LOW (ref 13.0–18.0)
MCH: 31.5 pg (ref 26.0–34.0)
MCHC: 32.4 g/dL (ref 32.0–36.0)
MCV: 97.4 fL (ref 80.0–100.0)
PLATELETS: 275 10*3/uL (ref 150–440)
RBC: 2.57 MIL/uL — AB (ref 4.40–5.90)
RDW: 19.6 % — AB (ref 11.5–14.5)
WBC: 6.2 10*3/uL (ref 3.8–10.6)

## 2018-02-03 MED ORDER — PANTOPRAZOLE SODIUM 40 MG IV SOLR: 40.0000 mg | Freq: Two times a day (BID) | INTRAVENOUS | Status: DC

## 2018-02-03 MED ORDER — SODIUM CHLORIDE 0.9 % IV SOLN
80.0000 mg | Freq: Once | INTRAVENOUS | Status: AC
  Administered 2018-02-03: 80 mg via INTRAVENOUS
  Filled 2018-02-03: qty 80

## 2018-02-03 MED ORDER — SODIUM CHLORIDE 0.9 % IV SOLN
8.0000 mg/h | INTRAVENOUS | Status: DC
  Administered 2018-02-03 – 2018-02-06 (×6): 8 mg/h via INTRAVENOUS
  Filled 2018-02-03 (×6): qty 80

## 2018-02-03 NOTE — ED Provider Notes (Signed)
[facility] Emergency Department Provider Note   ____________________________________________   I have reviewed the triage vital signs and the nursing notes.   HISTORY  Chief Complaint GI Bleeding   History limited by: Not Limited   HPI Caleb Edwards is a 67 y.o. male who presents to the emergency department today because of concerns for low hemoglobin and found on a patient labs.  Patient was seen by his primary care doctor today as a follow-up for recent hospitalization secondary to GI bleed.  He was told that his hemoglobin is 7.5.  Patient states that he has noticed some continued bloody stool since discharge from the hospital.  He does however feel like it is gotten somewhat better over the past day.  He has had some fatigue and lightheadedness since discharge.    Per medical record review patient has a history of recent hospitalization for GI bleed.   Past Medical History:  Diagnosis Date  . Back pain   . Blood transfusion without reported diagnosis 12/2015   bleeding ulcer hospitalization.   . Hypertension   . Ulcer, stomach peptic, chronic     Patient Active Problem List   Diagnosis Date Noted  . Rectal bleeding 01/23/2018  . Blood in stool   . GI bleed 07/31/2017  . Acute blood loss anemia 07/31/2017  . Abdominal pain 07/31/2017  . Near syncope 07/31/2017  . Chronic pain syndrome 09/09/2016  . Vitamin D insufficiency 01/07/2016  . Symptomatic anemia 01/02/2016  . Long term current use of opiate analgesic 09/24/2015  . Long term prescription opiate use 09/24/2015  . Opiate use 09/24/2015  . Encounter for therapeutic drug level monitoring 09/24/2015  . Encounter for chronic pain management 09/24/2015  . Insomnia secondary to chronic pain 09/24/2015  . Osteoarthritis 09/24/2015  . Chronic low back pain (Location of Primary Source of Pain) (Bilateral) (R>L) 09/24/2015  . Lumbar spondylosis 09/24/2015  . Chronic lower extremity pain  (Right) 09/24/2015  . 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 Lumbar facet syndrome (Location of Primary Source of Pain) (Bilateral) (R>L) 09/24/2015  . Diffuse myofascial pain syndrome 09/24/2015  . L4-5 disc bulge 09/24/2015  . 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 Chronic neck pain 09/24/2015  . Cervical spondylosis 09/24/2015    Past Surgical History:  Procedure Laterality Date  . COLONOSCOPY WITH PROPOFOL N/A 08/02/2017   Procedure: COLONOSCOPY WITH PROPOFOL;  Surgeon: Midge Minium, MD;  Location: [facility] ENDOSCOPY;  Service: Endoscopy;  Laterality: N/A;  . ESOPHAGOGASTRODUODENOSCOPY (EGD) WITH PROPOFOL N/A 01/03/2016   Procedure: ESOPHAGOGASTRODUODENOSCOPY (EGD) WITH PROPOFOL;  Surgeon: Scot Jun, MD;  Location: [facility] ENDOSCOPY;  Service: Endoscopy;  Laterality: N/A;  . ESOPHAGOGASTRODUODENOSCOPY (EGD) WITH PROPOFOL  08/02/2017   Procedure: ESOPHAGOGASTRODUODENOSCOPY (EGD) WITH PROPOFOL;  Surgeon: Midge Minium, MD;  Location: [facility] ENDOSCOPY;  Service: Endoscopy;;    Prior to Admission medications   Medication Sig Start Date End Date Taking? Authorizing Provider  amLODipine (NORVASC) 10 MG tablet Take 1 tablet by mouth daily. 12/31/17   [provider]  atorvastatin (LIPITOR) 20 MG tablet Take 1 tablet by mouth at bedtime. 12/31/17   [provider]  chlorthalidone (HYGROTON) 50 MG tablet Take 1 tablet by mouth daily. 12/31/17   [provider]  lisinopril (PRINIVIL,ZESTRIL) 40 MG tablet Take 1 tablet by mouth daily. 12/31/17   [provider]  pantoprazole (PROTONIX) 40 MG tablet Take 1 tablet (40 mg total) by mouth 2 (two) times daily before  a meal. 01/04/16   Shaune Pollack, MD  traMADol (ULTRAM) 50 MG tablet Take 2 tablets (100 mg total) by mouth every 6 (six) hours as needed for severe pain. 09/22/17 03/21/18  Barbette Merino, NP    Allergies Patient has no known allergies.  Family History   Problem Relation Age of Onset  . Diabetes Mother   . Heart disease Father   . Prostate cancer Brother   . Bladder Cancer Neg Hx   . Kidney cancer Neg Hx     Social History Social History   Tobacco Use  . Smoking status: Former Games developer  . Smokeless tobacco: Never Used  Substance Use Topics  . Alcohol use: No    Alcohol/week: 0.0 oz  . Drug use: No    Review of Systems Constitutional: No fever/chills. Positive for lightheadedness.  Eyes: No visual changes. ENT: No sore throat. Cardiovascular: Denies chest pain. Respiratory: Denies shortness of breath. Gastrointestinal: No abdominal pain.  Positive for gi bleed.  Genitourinary: Negative for dysuria. Musculoskeletal: Negative for back pain. Skin: Negative for rash. Neurological: Negative for headaches, focal weakness or numbness.  ____________________________________________   PHYSICAL EXAM:  VITAL SIGNS: ED Triage Vitals  Enc Vitals Group     BP 02/03/18 1713 (!) 141/75     Pulse Rate 02/03/18 1713 70     Resp 02/03/18 1713 18     Temp 02/03/18 1713 99.3 F (37.4 C)     Temp Source 02/03/18 1713 Oral     SpO2 02/03/18 1713 100 %     Weight 02/03/18 1717 175 lb (79.4 kg)     Height 02/03/18 1717 6' (1.829 m)     Head Circumference --      Peak Flow --    Constitutional: Alert and oriented. Well appearing and in no distress. Eyes: Conjunctivae are normal.  ENT   Head: Normocephalic and atraumatic.   Nose: No congestion/rhinnorhea.   Mouth/Throat: Mucous membranes are moist.   Neck: No stridor. Hematological/Lymphatic/Immunilogical: No cervical lymphadenopathy. Cardiovascular: Normal rate, regular rhythm.  No murmurs, rubs, or gallops.  Respiratory: Normal respiratory effort without tachypnea nor retractions. Breath sounds are clear and equal bilaterally. No wheezes/rales/rhonchi. Gastrointestinal: Soft and non tender. No rebound. No guarding.  Rectal: No bright red blood on glove. GUIAC positive.   Musculoskeletal: Normal range of motion in all extremities. No lower extremity edema. Neurologic:  Normal speech and language. No gross focal neurologic deficits are appreciated.  Skin:  Skin is warm, dry and intact. No rash noted. Psychiatric: Mood and affect are normal. Speech and behavior are normal. Patient exhibits appropriate insight and judgment.  ____________________________________________    LABS (pertinent positives/negatives)  CMP na 137, cr 1.34, ca 8.6 CBC wbc 6.2, hgb 8.1, plt 275 Type and screen 0 pos  ____________________________________________   EKG  I, Phineas Semen, attending physician, personally viewed and interpreted this EKG  EKG Time: 1722 Rate: 69 Rhythm: normal inus rhythm Axis: normal Intervals: qtc 394 QRS: narrow ST changes: no st elevation Impression: normal ekg   ____________________________________________    RADIOLOGY  None  ____________________________________________   PROCEDURES  Procedures  ____________________________________________   INITIAL IMPRESSION / ASSESSMENT AND PLAN / ED COURSE  Pertinent labs & imaging results that were available during my care of the patient were reviewed by me and considered in my medical decision making (see chart for details).  Patient presented to the emergency department today because of concerns for low hemoglobin in setting of recent admission for GI bleed.  On exam here patient is guaiac positive and does state he is continued to have bloody stool.  Hemoglobin was 8.1 which is decreased since recent admission.  Given continued bleeding and decreasing hemoglobin will start on Protonix plan on admission.  Discussed findings plan with patient.  ____________________________________________   FINAL CLINICAL IMPRESSION(S) / ED DIAGNOSES  Final diagnoses:  Gastrointestinal hemorrhage, unspecified gastrointestinal hemorrhage type  Anemia, unspecified type     Note: This dictation  was prepared with Dragon dictation. Any transcriptional errors that result from this process are unintentional     Phineas Semen, MD 02/04/18 [phone]

## 2018-02-03 NOTE — H&P (Signed)
[facility] - [place] at [facility]   PATIENT NAME: Caleb Edwards    MR#:  [id number]  DATE OF BIRTH:  05/09/1951  DATE OF ADMISSION:  02/03/2018  PRIMARY CARE PHYSICIAN: [facility]   REQUESTING/REFERRING PHYSICIAN: Derrill Kay, MD  CHIEF COMPLAINT:   Chief Complaint  Patient presents with  . GI Bleeding    HISTORY OF PRESENT ILLNESS:  Caleb Edwards  is a 67 y.o. male who presents with recurrent melena.  Patient was recently admitted here for GI bleed, but states he did not have any endoscopy done.  He comes back tonight after having more episodes of melena.  His hemoglobin has fallen from 10.5 to 8 over the last 10 days or so, and he is guaiac positive on stool check today.  He states he has a remote history of some ulcers with GI bleeding in the past.  Hospitalist called for admission  PAST MEDICAL HISTORY:   Past Medical History:  Diagnosis Date  . Back pain   . Blood transfusion without reported diagnosis 12/2015   bleeding ulcer hospitalization.   . Hypertension   . Ulcer, stomach peptic, chronic      PAST SURGICAL HISTORY:   Past Surgical History:  Procedure Laterality Date  . COLONOSCOPY WITH PROPOFOL N/A 08/02/2017   Procedure: COLONOSCOPY WITH PROPOFOL;  Surgeon: Midge Minium, MD;  Location: [facility] ENDOSCOPY;  Service: Endoscopy;  Laterality: N/A;  . ESOPHAGOGASTRODUODENOSCOPY (EGD) WITH PROPOFOL N/A 01/03/2016   Procedure: ESOPHAGOGASTRODUODENOSCOPY (EGD) WITH PROPOFOL;  Surgeon: Scot Jun, MD;  Location: [facility] ENDOSCOPY;  Service: Endoscopy;  Laterality: N/A;  . ESOPHAGOGASTRODUODENOSCOPY (EGD) WITH PROPOFOL  08/02/2017   Procedure: ESOPHAGOGASTRODUODENOSCOPY (EGD) WITH PROPOFOL;  Surgeon: Midge Minium, MD;  Location: [facility] ENDOSCOPY;  Service: Endoscopy;;     SOCIAL HISTORY:   Social History   Tobacco Use  . Smoking status: Former Games developer  . Smokeless tobacco: Never Used  Substance Use Topics  . Alcohol use: No     Alcohol/week: 0.0 oz     FAMILY HISTORY:   Family History  Problem Relation Age of Onset  . Diabetes Mother   . Heart disease Father   . Prostate cancer Brother   . Bladder Cancer Neg Hx   . Kidney cancer Neg Hx      DRUG ALLERGIES:  No Known Allergies  MEDICATIONS AT HOME:   Prior to Admission medications   Medication Sig Start Date End Date Taking? Authorizing Provider  amLODipine (NORVASC) 10 MG tablet Take 1 tablet by mouth daily. 12/31/17  Yes [provider]  atorvastatin (LIPITOR) 20 MG tablet Take 1 tablet by mouth at bedtime. 12/31/17  Yes [provider]  chlorthalidone (HYGROTON) 50 MG tablet Take 1 tablet by mouth daily. 12/31/17  Yes [provider]  ferrous sulfate 325 (65 FE) MG tablet Take 325 mg by mouth daily with breakfast.   Yes [provider]  lisinopril (PRINIVIL,ZESTRIL) 40 MG tablet Take 1 tablet by mouth daily. 12/31/17  Yes [provider]  pantoprazole (PROTONIX) 40 MG tablet Take 1 tablet (40 mg total) by mouth 2 (two) times daily before a meal. 01/04/16  Yes Shaune Pollack, MD  polyethylene glycol [facility] / Ethelene Hal) packet Take 17 g by mouth daily.   Yes [provider]  sildenafil (VIAGRA) 100 MG tablet Take 100 mg by mouth daily as needed for erectile dysfunction.   Yes [provider]  traMADol (ULTRAM) 50 MG tablet Take 2 tablets (100 mg total) by  mouth every 6 (six) hours as needed for severe pain. Patient taking differently: Take 100 mg by mouth every 4 (four) hours as needed for severe pain (back pain).  09/22/17 03/21/18 Yes Barbette Merino, NP    REVIEW OF SYSTEMS:  Review of Systems  Constitutional: Negative for chills, fever, malaise/fatigue and weight loss.  HENT: Negative for ear pain, hearing loss and tinnitus.   Eyes: Negative for blurred vision, double vision, pain and redness.  Respiratory: Negative for cough, hemoptysis and shortness of breath.   Cardiovascular: Negative  for chest pain, palpitations, orthopnea and leg swelling.  Gastrointestinal: Positive for melena. Negative for abdominal pain, constipation, diarrhea, nausea and vomiting.  Genitourinary: Negative for dysuria, frequency and hematuria.  Musculoskeletal: Negative for back pain, joint pain and neck pain.  Skin:       No acne, rash, or lesions  Neurological: Negative for dizziness, tremors, focal weakness and weakness.  Endo/Heme/Allergies: Negative for polydipsia. Does not bruise/bleed easily.  Psychiatric/Behavioral: Negative for depression. The patient is not nervous/anxious and does not have insomnia.      VITAL SIGNS:   Vitals:   02/03/18 1713 02/03/18 1717 02/03/18 2052 02/03/18 2245  BP: (!) 141/75  (!) 153/79 (!) 148/86  Pulse: 70  60 61  Resp: Temp: 99.3 F (37.4 C)     TempSrc: Oral     SpO2: 100%  100% 99%  Weight:  79.4 kg (175 lb)    Height:  6' (1.829 m)     Wt Readings from Last 3 Encounters:  02/03/18 79.4 kg (175 lb)  01/23/18 81.6 kg (180 lb)  09/20/17 78 kg (172 lb)    PHYSICAL EXAMINATION:  Physical Exam  Vitals reviewed. Constitutional: He is oriented to person, place, and time. He appears well-developed and well-nourished. No distress.  HENT:  Head: Normocephalic and atraumatic.  Mouth/Throat: Oropharynx is clear and moist.  Eyes: Pupils are equal, round, and reactive to light. Conjunctivae and EOM are normal. No scleral icterus.  Neck: Normal range of motion. Neck supple. No JVD present. No thyromegaly present.  Cardiovascular: Normal rate, regular rhythm and intact distal pulses. Exam reveals no gallop and no friction rub.  No murmur heard. Respiratory: Effort normal and breath sounds normal. No respiratory distress. He has no wheezes. He has no rales.  GI: Soft. Bowel sounds are normal. He exhibits no distension. There is no tenderness.  Musculoskeletal: Normal range of motion. He exhibits no edema.  No arthritis, no gout  Lymphadenopathy:     He has no cervical adenopathy.  Neurological: He is alert and oriented to person, place, and time. No cranial nerve deficit.  No dysarthria, no aphasia  Skin: Skin is warm and dry. No rash noted. No erythema.  Psychiatric: He has a normal mood and affect. His behavior is normal. Judgment and thought content normal.    LABORATORY PANEL:   CBC Recent Labs  Lab 02/03/18 1723  WBC 6.2  HGB 8.1*  HCT 25.0*  PLT 275   ------------------------------------------------------------------------------------------------------------------  Chemistries  Recent Labs  Lab 02/03/18 1723  NA 137  K 3.6  CL 104  CO2 27  GLUCOSE 92  BUN 11  CREATININE 1.34*  CALCIUM 8.6*  AST 21  ALT 16*  ALKPHOS 65  BILITOT 0.3   ------------------------------------------------------------------------------------------------------------------  Cardiac Enzymes No results for input(s): TROPONINI in the last 168 hours. ------------------------------------------------------------------------------------------------------------------  RADIOLOGY:  No results found.  EKG:   Orders placed or performed during the hospital  encounter of 02/03/18  . ED EKG  . ED EKG    IMPRESSION AND PLAN:  Principal Problem:   GI bleed -IV Protonix, GI consult Active Problems:   Acute blood loss anemia -hemoglobin is 8, will check it serially and if he falls below 7 will consider transfusion   HTN (hypertension) -continue home meds   CKD (chronic kidney disease), stage III (HCC) -at baseline, avoid nephrotoxins and monitor  Chart review performed and case discussed with ED provider. Labs, imaging and/or ECG reviewed by provider and discussed with patient/family. Management plans discussed with the patient and/or family.  DVT PROPHYLAXIS: Mechanical only  GI PROPHYLAXIS: PPI  ADMISSION STATUS: Inpatient  CODE STATUS: Full Code Status History    Date Active Date Inactive Code Status Order ID Comments User  Context   01/23/2018 1226 01/24/2018 1827 Full Code [id number]  Shaune Pollack, MD Inpatient   07/31/2017 1326 08/02/2017 1907 Full Code [id number]  Marguarite Arbour, MD Inpatient   01/02/2016 1814 01/04/2016 1420 Full Code [id number]  Ramonita Lab, MD ED      TOTAL TIME TAKING CARE OF THIS PATIENT: 45 minutes.   Zakariyah Freimark FIELDING 02/03/2018, 11:52 PM  [employer]  [phone]  CC: Primary care physician; [employer] [employer]  Note:  This document was prepared using Conservation officer, historic buildings and may include unintentional dictation errors.

## 2018-02-03 NOTE — ED Notes (Signed)
Hemoccult POSITIVE 

## 2018-02-03 NOTE — ED Triage Notes (Signed)
Patient presents to the ED from his primary care provider.  Patient was recently admitted to the hospital for a GI bleed but since discharge has had dark stools, felt dizzy and very tired.  Patient's hemoglobin was 7.8 today at his PCP.

## 2018-02-04 ENCOUNTER — Other Ambulatory Visit: Payer: Self-pay

## 2018-02-04 DIAGNOSIS — K922 Gastrointestinal hemorrhage, unspecified: Secondary | ICD-10-CM

## 2018-02-04 LAB — BASIC METABOLIC PANEL
ANION GAP: 6 (ref 5–15)
BUN: 11 mg/dL (ref 6–20)
CHLORIDE: 105 mmol/L (ref 101–111)
CO2: 27 mmol/L (ref 22–32)
CREATININE: 1.26 mg/dL — AB (ref 0.61–1.24)
Calcium: 7.8 mg/dL — ABNORMAL LOW (ref 8.9–10.3)
GFR calc non Af Amer: 58 mL/min — ABNORMAL LOW (ref >60)
Glucose, Bld: 112 mg/dL — ABNORMAL HIGH (ref 65–99)
Potassium: 3.5 mmol/L (ref 3.5–5.1)
Sodium: 138 mmol/L (ref 135–145)

## 2018-02-04 LAB — CBC
HCT: 23.7 % — ABNORMAL LOW (ref 40.0–52.0)
HEMOGLOBIN: 7.6 g/dL — AB (ref 13.0–18.0)
MCH: 31 pg (ref 26.0–34.0)
MCHC: 32.1 g/dL (ref 32.0–36.0)
MCV: 96.7 fL (ref 80.0–100.0)
Platelets: 232 10*3/uL (ref 150–440)
RBC: 2.45 MIL/uL — AB (ref 4.40–5.90)
RDW: 19.2 % — ABNORMAL HIGH (ref 11.5–14.5)
WBC: 5.1 10*3/uL (ref 3.8–10.6)

## 2018-02-04 LAB — HEMOGLOBIN: Hemoglobin: 7.9 g/dL — ABNORMAL LOW (ref 13.0–18.0)

## 2018-02-04 LAB — PREPARE RBC (CROSSMATCH)

## 2018-02-04 MED ORDER — ACETAMINOPHEN 325 MG PO TABS: 650.0000 mg | ORAL_TABLET | Freq: Four times a day (QID) | ORAL | Status: DC | PRN

## 2018-02-04 MED ORDER — OXYCODONE HCL 5 MG PO TABS
5.0000 mg | ORAL_TABLET | ORAL | Status: DC | PRN
  Administered 2018-02-04 – 2018-02-06 (×5): 5 mg via ORAL
  Filled 2018-02-04 (×5): qty 1

## 2018-02-04 MED ORDER — SODIUM CHLORIDE 0.9 % IV SOLN
INTRAVENOUS | Status: AC
  Administered 2018-02-04: 01:00:00 via INTRAVENOUS

## 2018-02-04 MED ORDER — AMLODIPINE BESYLATE 10 MG PO TABS
10.0000 mg | ORAL_TABLET | Freq: Every day | ORAL | Status: DC
  Administered 2018-02-04 – 2018-02-06 (×3): 10 mg via ORAL
  Filled 2018-02-04 (×3): qty 1

## 2018-02-04 MED ORDER — SODIUM CHLORIDE 0.9 % IV SOLN: INTRAVENOUS | Status: DC

## 2018-02-04 MED ORDER — ACETAMINOPHEN 650 MG RE SUPP: 650.0000 mg | Freq: Four times a day (QID) | RECTAL | Status: DC | PRN

## 2018-02-04 MED ORDER — ONDANSETRON HCL 4 MG PO TABS: 4.0000 mg | ORAL_TABLET | Freq: Four times a day (QID) | ORAL | Status: DC | PRN

## 2018-02-04 MED ORDER — ATORVASTATIN CALCIUM 20 MG PO TABS
20.0000 mg | ORAL_TABLET | Freq: Every day | ORAL | Status: DC
  Administered 2018-02-04 – 2018-02-05 (×2): 20 mg via ORAL
  Filled 2018-02-04 (×2): qty 1

## 2018-02-04 MED ORDER — SODIUM CHLORIDE 0.9 % IV SOLN: Freq: Once | INTRAVENOUS | Status: DC

## 2018-02-04 MED ORDER — PEG 3350-KCL-NA BICARB-NACL 420 G PO SOLR
4000.0000 mL | Freq: Once | ORAL | Status: AC
  Administered 2018-02-04: 4000 mL via ORAL
  Filled 2018-02-04 (×2): qty 4000

## 2018-02-04 MED ORDER — LISINOPRIL 20 MG PO TABS
40.0000 mg | ORAL_TABLET | Freq: Every day | ORAL | Status: DC
  Administered 2018-02-04 – 2018-02-06 (×3): 40 mg via ORAL
  Filled 2018-02-04 (×3): qty 2

## 2018-02-04 MED ORDER — ONDANSETRON HCL 4 MG/2ML IJ SOLN: 4.0000 mg | Freq: Four times a day (QID) | INTRAMUSCULAR | Status: DC | PRN

## 2018-02-04 NOTE — Progress Notes (Signed)
CH provided HCPOA education to patient and one other person in room. Patient will contact when ready to complete. CH will leave note for unit [facility] to provide a follow-up consult.

## 2018-02-04 NOTE — Consult Note (Signed)
Wyline Mood , MD [address], [place], [place], [address] [address], [place], [place], [address] Phone: [phone]  Fax: [phone]  Consultation  Referring Provider: Dr Allena Katz Primary Care Physician:  [facility], [facility] Primary Gastroenterologist:  Dr. Servando Snare         Reason for Consultation:     Sandria Manly bleed   Date of Admission:  02/03/2018 Date of Consultation:  02/04/2018         HPI:   Caleb Edwards is a 67 y.o. male who  underwent an upper endoscopy and colonoscopy in October 2018 by Dr. Servando Snare.  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  The colonoscopy demonstrated black stool throughout the colon and pancolonic diverticulosis. He was actually admitted on 01/23/2018 and seen by myself for rectal bleeding of 2 days duration.  And 2 weeks back my impression was that was possibly a diverticular bleed since the bleeding resolved with conservative therapy I felt no further evaluation was required and he was subsequently discharged. He went home , was actually constipated and took a laxative and later had black tarry stools on and off for a few days and then stools returned to normal brown on Friday . He had his labs checked by his PCP on Friday and  was admitted on 02/03/2018 with melena hemoglobin dropped from 10.5 g to 8 g.  Denies any NSAID use or abdominal pain. No bowel movements since Friday .   Past Medical History:  Diagnosis Date  . Back pain   . Blood transfusion without reported diagnosis 12/2015   bleeding ulcer hospitalization.   . Hypertension   . Ulcer, stomach peptic, chronic     Past Surgical History:  Procedure Laterality Date  . COLONOSCOPY WITH PROPOFOL N/A 08/02/2017   Procedure: COLONOSCOPY WITH PROPOFOL;  Surgeon: Midge Minium, MD;  Location: [facility] ENDOSCOPY;  Service: Endoscopy;  Laterality: N/A;  . ESOPHAGOGASTRODUODENOSCOPY (EGD) WITH PROPOFOL N/A 01/03/2016   Procedure: ESOPHAGOGASTRODUODENOSCOPY  (EGD) WITH PROPOFOL;  Surgeon: Scot Jun, MD;  Location: [facility] ENDOSCOPY;  Service: Endoscopy;  Laterality: N/A;  . ESOPHAGOGASTRODUODENOSCOPY (EGD) WITH PROPOFOL  08/02/2017   Procedure: ESOPHAGOGASTRODUODENOSCOPY (EGD) WITH PROPOFOL;  Surgeon: Midge Minium, MD;  Location: [facility] ENDOSCOPY;  Service: Endoscopy;;    Prior to Admission medications   Medication Sig Start Date End Date Taking? Authorizing Provider  amLODipine (NORVASC) 10 MG tablet Take 1 tablet by mouth daily. 12/31/17  Yes [provider]  atorvastatin (LIPITOR) 20 MG tablet Take 1 tablet by mouth at bedtime. 12/31/17  Yes [provider]  chlorthalidone (HYGROTON) 50 MG tablet Take 1 tablet by mouth daily. 12/31/17  Yes [provider]  ferrous sulfate 325 (65 FE) MG tablet Take 325 mg by mouth daily with breakfast.   Yes [provider]  lisinopril (PRINIVIL,ZESTRIL) 40 MG tablet Take 1 tablet by mouth daily. 12/31/17  Yes [provider]  pantoprazole (PROTONIX) 40 MG tablet Take 1 tablet (40 mg total) by mouth 2 (two) times daily before a meal. 01/04/16  Yes Shaune Pollack, MD  polyethylene glycol [facility] Ethelene Hal) packet Take 17 g by mouth daily.   Yes [provider]  sildenafil (VIAGRA) 100 MG tablet Take 100 mg by mouth daily as needed for erectile dysfunction.   Yes [provider]  traMADol (ULTRAM) 50 MG tablet Take 2 tablets (100 mg total) by mouth every 6 (six) hours as needed for severe pain. Patient taking differently: Take 100 mg  by mouth every 4 (four) hours as needed for severe pain (back pain).  09/22/17 03/21/18 Yes Barbette Merino, NP    Family History  Problem Relation Age of Onset  . Diabetes Mother   . Heart disease Father   . Prostate cancer Brother   . Bladder Cancer Neg Hx   . Kidney cancer Neg Hx      Social History   Tobacco Use  . Smoking status: Former Games developer  . Smokeless tobacco: Never Used  Substance Use Topics  . Alcohol use:  No    Alcohol/week: 0.0 oz  . Drug use: No    Allergies as of 02/03/2018  . (No Known Allergies)    Review of Systems:    All systems reviewed and negative except where noted in HPI.   Physical Exam:  Vital signs in last 24 hours: Temp:  [97.6 F (36.4 C)-99.3 F (37.4 C)] 97.6 F (36.4 C) (05/04 0444) Pulse Rate:  [58-79] 58 (05/04 0444) Resp:  [16-18] 18 (05/04 0444) BP: (129-153)/(75-86) 129/78 (05/04 0444) SpO2:  [98 %-100 %] 100 % (05/04 0444) Weight:  [172 lb (78 kg)-175 lb (79.4 kg)] 172 lb (78 kg) (05/04 0046) Last BM Date: 02/03/18 General:   Pleasant, cooperative in NAD Head:  Normocephalic and atraumatic. Eyes:   No icterus.   Conjunctiva pink. PERRLA. Ears:  Normal auditory acuity. Neck:  Supple; no masses or thyroidomegaly Lungs: Respirations even and unlabored. Lungs clear to auscultation bilaterally.   No wheezes, crackles, or rhonchi.  Heart:  Regular rate and rhythm;  Without murmur, clicks, rubs or gallops Abdomen:  Soft, nondistended, nontender. Normal bowel sounds. No appreciable masses or hepatomegaly.  No rebound or guarding.  Neurologic:  Alert and oriented x3;  grossly normal neurologically. Skin:  Intact without significant lesions or rashes. Cervical Nodes:  No significant cervical adenopathy. Psych:  Alert and cooperative. Normal affect.  LAB RESULTS: Recent Labs    02/03/18 1723 02/04/18 0055 02/04/18 0623  WBC 6.2  --  5.1  HGB 8.1* 7.9* 7.6*  HCT 25.0*  --  23.7*  PLT 275  --  232   BMET Recent Labs    02/03/18 1723 02/04/18 0623  NA 137 138  K 3.6 3.5  CL 104 105  CO2 27 27  GLUCOSE 92 112*  BUN 11 11  CREATININE 1.34* 1.26*  CALCIUM 8.6* 7.8*   LFT Recent Labs    02/03/18 1723  PROT 6.9  ALBUMIN 3.8  AST 21  ALT 16*  ALKPHOS 65  BILITOT 0.3   PT/INR No results for input(s): LABPROT, INR in the last 72 hours.  STUDIES: No results found.    Impression / Plan:   Caleb Edwards is a 67 y.o. y/o male with  past medical history of melena/GI bleeding previously been evaluated by colorectal clinic and recently in 2018 by Dr. Daleen Squibb.  Recently admitted about 2 weeks back with GI bleeding which I personally felt was due to a diverticular bleed.  The patient was sent home at the bleeding stopped and subsequently has been readmitted with further rectal bleeding and a 2 g drop in hemoglobin since discharge.  Since this has recurred twice and short duration of time I think at this point of time he would need to be reevaluated.     Plan 1 EGD plus colonoscopy tomorrow if negative will proceed with capsule study of the small bowel on Monday.   2.  If has bleeding in the interim while  we are evaluating his procedures tomorrow then he should undergo a tagged RBC scan to determine if the bleeding is actually from his colon which would again reiterate our prior impression of a possible diverticular bleed. 3.  Monitor CBC and transfuse as needed. 4.  Continue PPI  Thank you for involving me in the care of this patient.      LOS: 1 day   Wyline Mood, MD  02/04/2018, 10:40 AM

## 2018-02-04 NOTE — Progress Notes (Signed)
[facility] - [place] at [facility]                                                                                                                                                                                  Patient Demographics   Caleb Edwards, is a 67 y.o. male, DOB - March 25, 1951, WUJ:[id number]  Admit date - 02/03/2018   Admitting Physician Oralia Manis, MD  Outpatient Primary MD for the patient is [facility]   LOS - 1  Subjective: Since admission no further bloody stools hemoglobin is dropped further to 7.6    Review of Systems:   CONSTITUTIONAL: No documented fever. No fatigue, weakness. No weight gain, no weight loss.  EYES: No blurry or double vision.  ENT: No tinnitus. No postnasal drip. No redness of the oropharynx.  RESPIRATORY: No cough, no wheeze, no hemoptysis. No dyspnea.  CARDIOVASCULAR: No chest pain. No orthopnea. No palpitations. No syncope.  GASTROINTESTINAL: No nausea, no vomiting or diarrhea. No abdominal pain.  Positive melena or hematochezia.  GENITOURINARY: No dysuria or hematuria.  ENDOCRINE: No polyuria or nocturia. No heat or cold intolerance.  HEMATOLOGY: No anemia. No bruising. No bleeding.  INTEGUMENTARY: No rashes. No lesions.  MUSCULOSKELETAL: No arthritis. No swelling. No gout.  NEUROLOGIC: No numbness, tingling, or ataxia. No seizure-type activity.  PSYCHIATRIC: No anxiety. No insomnia. No ADD.    Vitals:   Vitals:   02/04/18 0012 02/04/18 0018 02/04/18 0046 02/04/18 0444  BP:   (!) 145/84 129/78  Pulse: 71 79 65 (!) 58  Resp:   18 18  Temp:   98.4 F (36.9 C) 97.6 F (36.4 C)  TempSrc:   Oral Oral  SpO2: 98% 98% 100% 100%  Weight:   78 kg (172 lb)   Height:        Wt Readings from Last 3 Encounters:  02/04/18 78 kg (172 lb)  01/23/18 81.6 kg (180 lb)  09/20/17 78 kg (172 lb)     Intake/Output Summary (Last 24 hours) at 02/04/2018 1315 Last data filed at 02/04/2018 0956 Gross per 24 hour   Intake 991 ml  Output 675 ml  Net 316 ml    Physical Exam:   GENERAL: Pleasant-appearing in no apparent distress.  HEAD, EYES, EARS, NOSE AND THROAT: Atraumatic, normocephalic. Extraocular muscles are intact. Pupils equal and reactive to light. Sclerae anicteric. No conjunctival injection. No oro-pharyngeal erythema.  NECK: Supple. There is no jugular venous distention. No bruits, no lymphadenopathy, no thyromegaly.  HEART: Regular rate and rhythm,. No murmurs, no rubs, no clicks.  LUNGS: Clear to auscultation bilaterally. No rales or rhonchi. No wheezes.  ABDOMEN: Soft, flat, nontender, nondistended.  Has good bowel sounds. No hepatosplenomegaly appreciated.  EXTREMITIES: No evidence of any cyanosis, clubbing, or peripheral edema.  +2 pedal and radial pulses bilaterally.  NEUROLOGIC: The patient is alert, awake, and oriented x3 with no focal motor or sensory deficits appreciated bilaterally.  SKIN: Moist and warm with no rashes appreciated.  Psych: Not anxious, depressed LN: No inguinal LN enlargement    Antibiotics   Anti-infectives (From admission, onward)   None      Medications   Scheduled Meds: . amLODipine  10 mg Oral Daily  . atorvastatin  20 mg Oral QHS  . lisinopril  40 mg Oral Daily  . [START ON 02/07/2018] pantoprazole  40 mg Intravenous Q12H  . polyethylene glycol-electrolytes  4,000 mL Oral Once   Continuous Infusions: . sodium chloride    . sodium chloride    . pantoprozole (PROTONIX) infusion 8 mg/hr (02/04/18 0928)   PRN Meds:.acetaminophen **OR** acetaminophen, ondansetron **OR** ondansetron (ZOFRAN) IV, oxyCODONE   Data Review:   Micro Results No results found for this or any previous visit (from the past 240 hour(s)).  Radiology Reports Ct Abdomen Pelvis W Contrast  Result Date: 01/23/2018 CLINICAL DATA:  Abdominal pain and rectal bleeding for 2 days. Diarrhea. Diverticulosis. Nephrolithiasis. EXAM: CT ABDOMEN AND PELVIS WITH CONTRAST TECHNIQUE:  Multidetector CT imaging of the abdomen and pelvis was performed using the standard protocol following bolus administration of intravenous contrast. CONTRAST:  ISOVUE-300 IOPAMIDOL (ISOVUE-300) INJECTION 61% COMPARISON:  06/07/2017 FINDINGS: Lower Chest: No acute findings. Hepatobiliary: No hepatic masses identified. Gallbladder is unremarkable. Pancreas:  No mass or inflammatory changes. Spleen: Within normal limits in size and appearance. Adrenals/Urinary Tract: No masses identified. No evidence of hydronephrosis. Small bilateral nonobstructing renal calculi again seen. Tiny bilateral renal cysts are unchanged. Unremarkable unopacified urinary bladder. Stomach/Bowel: No evidence of obstruction, inflammatory process or abnormal fluid collections. Normal appendix visualized. Diffuse colonic diverticulosis is again seen, however there is no evidence of diverticulitis. Vascular/Lymphatic: No pathologically enlarged lymph nodes. No abdominal aortic aneurysm. Aortic atherosclerosis. Reproductive:  Stable mildly enlarged prostate. Other:  None. Musculoskeletal: No suspicious bone lesions identified. Stable lumbar spondylosis with grade 1 anterolisthesis at L5-S1. IMPRESSION: Colonic diverticulosis, without radiographic evidence of diverticulitis or other acute findings. Bilateral nonobstructing nephrolithiasis. Stable mildly enlarged prostate. Electronically Signed   By: Myles Rosenthal M.D.   On: 01/23/2018 12:08     CBC Recent Labs  Lab 02/03/18 1723 02/04/18 0055 02/04/18 0623  WBC 6.2  --  5.1  HGB 8.1* 7.9* 7.6*  HCT 25.0*  --  23.7*  PLT 275  --  232  MCV 97.4  --  96.7  MCH 31.5  --  31.0  MCHC 32.4  --  32.1  RDW 19.6*  --  19.2*    Chemistries  Recent Labs  Lab 02/03/18 1723 02/04/18 0623  NA 137 138  K 3.6 3.5  CL 104 105  CO2 27 27  GLUCOSE 92 112*  BUN 11 11  CREATININE 1.34* 1.26*  CALCIUM 8.6* 7.8*  AST 21  --   ALT 16*  --   ALKPHOS 65  --   BILITOT 0.3  --     ------------------------------------------------------------------------------------------------------------------ estimated creatinine clearance is 64.2 mL/min (A) (by C-G formula based on SCr of 1.26 mg/dL (H)). ------------------------------------------------------------------------------------------------------------------ No results for input(s): HGBA1C in the last 72 hours. ------------------------------------------------------------------------------------------------------------------ No results for input(s): CHOL, HDL, LDLCALC, TRIG, CHOLHDL, LDLDIRECT in the last 72 hours. ------------------------------------------------------------------------------------------------------------------ No results for input(s): TSH, T4TOTAL, T3FREE, THYROIDAB in  the last 72 hours.  Invalid input(s): FREET3 ------------------------------------------------------------------------------------------------------------------ No results for input(s): VITAMINB12, FOLATE, FERRITIN, TIBC, IRON, RETICCTPCT in the last 72 hours.  Coagulation profile No results for input(s): INR, PROTIME in the last 168 hours.  No results for input(s): DDIMER in the last 72 hours.  Cardiac Enzymes No results for input(s): CKMB, TROPONINI, MYOGLOBIN in the last 168 hours.  Invalid input(s): CK ------------------------------------------------------------------------------------------------------------------ Invalid input(s): POCBNP    Assessment & Plan  Patient is a 67 year old with recent admission for GI bleed readmitted with GI bleed  #1 GI bleed possible upper continue IV Protonix GI consult pending  #2 acute blood loss anemia hemoglobin is dropped further I have discussed with patient we will transfuse him 1 unit of packed red blood  #3   HTN (hypertension) -continue home meds blood pressure stable  #4 CKD (chronic kidney disease), stage III (HCC) -at baseline, avoid nephrotoxins and monitor        Code  Status Orders  (From admission, onward)        Start     Ordered   02/04/18 0044  Full code  Continuous     02/04/18 0043    Code Status History    Date Active Date Inactive Code Status Order ID Comments User Context   01/23/2018 1226 01/24/2018 1827 Full Code [id number]  Shaune Pollack, MD Inpatient   07/31/2017 1326 08/02/2017 1907 Full Code [id number]  Marguarite Arbour, MD Inpatient   01/02/2016 1814 01/04/2016 1420 Full Code [id number]  Ramonita Lab, MD ED           Consults gastroenterology   DVT Prophylaxis SCDs  Lab Results  Component Value Date   PLT 232 02/04/2018     Time Spent in minutes 45 minutes greater than 50% of time spent in care coordination and counseling patient regarding the condition and plan of care.   Auburn Bilberry M.D on 02/04/2018 at 1:15 PM  Between 7am to 6pm - Pager - [phone]  After 6pm go to [email or web address] - Social research officer, government  [employer]   Office  [phone]

## 2018-02-04 NOTE — ED Notes (Signed)
Transport to floor room 210.AS

## 2018-02-05 ENCOUNTER — Encounter
Admission: EM | Disposition: A | Discharge status: Home / Self Care | Payer: Self-pay | Source: Home / Self Care | Attending: Internal Medicine

## 2018-02-05 ENCOUNTER — Encounter: Payer: Self-pay | Admitting: *Deleted

## 2018-02-05 ENCOUNTER — Inpatient Hospital Stay: Payer: Medicare HMO | Admitting: Anesthesiology

## 2018-02-05 DIAGNOSIS — K315 Obstruction of duodenum: Secondary | ICD-10-CM

## 2018-02-05 DIAGNOSIS — K5730 Diverticulosis of large intestine without perforation or abscess without bleeding: Secondary | ICD-10-CM

## 2018-02-05 HISTORY — PX: COLONOSCOPY WITH PROPOFOL: SHX5780

## 2018-02-05 HISTORY — PX: ESOPHAGOGASTRODUODENOSCOPY (EGD) WITH PROPOFOL: SHX5813

## 2018-02-05 LAB — BASIC METABOLIC PANEL
Anion gap: 7 (ref 5–15)
BUN: 7 mg/dL (ref 6–20)
CO2: 25 mmol/L (ref 22–32)
Calcium: 8 mg/dL — ABNORMAL LOW (ref 8.9–10.3)
Chloride: 107 mmol/L (ref 101–111)
Creatinine, Ser: 1.04 mg/dL (ref 0.61–1.24)
GFR calc Af Amer: >60 mL/min (ref >60)
GFR calc non Af Amer: >60 mL/min (ref >60)
Glucose, Bld: 100 mg/dL — ABNORMAL HIGH (ref 65–99)
Potassium: 3.9 mmol/L (ref 3.5–5.1)
Sodium: 139 mmol/L (ref 135–145)

## 2018-02-05 LAB — CBC
HEMATOCRIT: 27.7 % — AB (ref 40.0–52.0)
HEMOGLOBIN: 9.3 g/dL — AB (ref 13.0–18.0)
MCH: 31.8 pg (ref 26.0–34.0)
MCHC: 33.7 g/dL (ref 32.0–36.0)
MCV: 94.2 fL (ref 80.0–100.0)
Platelets: 240 10*3/uL (ref 150–440)
RBC: 2.94 MIL/uL — ABNORMAL LOW (ref 4.40–5.90)
RDW: 19 % — AB (ref 11.5–14.5)
WBC: 6.6 10*3/uL (ref 3.8–10.6)

## 2018-02-05 LAB — TYPE AND SCREEN
ABO/RH(D): O POS
Antibody Screen: NEGATIVE
Unit division: 0

## 2018-02-05 LAB — BPAM RBC
Blood Product Expiration Date: 201905082359
ISSUE DATE / TIME: 201905041330
Unit Type and Rh: 9500

## 2018-02-05 SURGERY — ESOPHAGOGASTRODUODENOSCOPY (EGD) WITH PROPOFOL
Anesthesia: General

## 2018-02-05 MED ORDER — MIDAZOLAM HCL 2 MG/2ML IJ SOLN
INTRAMUSCULAR | Status: DC | PRN
  Administered 2018-02-05: 2 mg via INTRAVENOUS

## 2018-02-05 MED ORDER — EPHEDRINE SULFATE 50 MG/ML IJ SOLN
INTRAMUSCULAR | Status: DC | PRN
  Administered 2018-02-05: 5 mg via INTRAVENOUS

## 2018-02-05 MED ORDER — LIDOCAINE HCL (PF) 2 % IJ SOLN
INTRAMUSCULAR | Status: AC
  Filled 2018-02-05: qty 10

## 2018-02-05 MED ORDER — PROPOFOL 500 MG/50ML IV EMUL
INTRAVENOUS | Status: DC | PRN
  Administered 2018-02-05: 140 ug/kg/min via INTRAVENOUS

## 2018-02-05 MED ORDER — PROPOFOL 500 MG/50ML IV EMUL
INTRAVENOUS | Status: AC
  Filled 2018-02-05: qty 50

## 2018-02-05 MED ORDER — PROPOFOL 10 MG/ML IV BOLUS
INTRAVENOUS | Status: DC | PRN
  Administered 2018-02-05: 20 mg via INTRAVENOUS
  Administered 2018-02-05: 70 mg via INTRAVENOUS

## 2018-02-05 MED ORDER — MIDAZOLAM HCL 2 MG/2ML IJ SOLN
INTRAMUSCULAR | Status: AC
  Filled 2018-02-05: qty 2

## 2018-02-05 MED ORDER — PHENYLEPHRINE HCL 10 MG/ML IJ SOLN
INTRAMUSCULAR | Status: DC | PRN
  Administered 2018-02-05 (×2): 100 ug via INTRAVENOUS

## 2018-02-05 MED ORDER — MORPHINE SULFATE (PF) 2 MG/ML IV SOLN
2.0000 mg | INTRAVENOUS | Status: DC | PRN
  Administered 2018-02-05: 2 mg via INTRAVENOUS
  Filled 2018-02-05: qty 1

## 2018-02-05 MED ORDER — EPHEDRINE SULFATE 50 MG/ML IJ SOLN
INTRAMUSCULAR | Status: AC
Start: 2018-02-05 — End: 2018-02-05
  Filled 2018-02-05: qty 1

## 2018-02-05 MED ORDER — LIDOCAINE HCL (CARDIAC) PF 100 MG/5ML IV SOSY
PREFILLED_SYRINGE | INTRAVENOUS | Status: DC | PRN
  Administered 2018-02-05: 100 mg via INTRAVENOUS

## 2018-02-05 MED ORDER — SODIUM CHLORIDE 0.9 % IV SOLN: INTRAVENOUS | Status: DC

## 2018-02-05 NOTE — Anesthesia Preprocedure Evaluation (Addendum)
Anesthesia Evaluation  Patient identified by MRN, date of birth, ID band Patient awake    Reviewed: Allergy & Precautions, NPO status , Patient's Chart, lab work & pertinent test results, reviewed documented beta blocker date and time   Airway Mallampati: II  TM Distance: >3 FB     Dental  (+) Chipped, Upper Dentures, Partial Lower   Pulmonary former smoker,           Cardiovascular hypertension, Pt. on medications      Neuro/Psych  Neuromuscular disease    GI/Hepatic PUD,   Endo/Other    Renal/GU Renal disease     Musculoskeletal  (+) Arthritis ,   Abdominal   Peds  Hematology  (+) anemia ,   Anesthesia Other Findings Hb now 9.3.  Reproductive/Obstetrics                            Anesthesia Physical Anesthesia Plan  ASA: III  Anesthesia Plan: General   Post-op Pain Management:    Induction: Intravenous  PONV Risk Score and Plan:   Airway Management Planned:   Additional Equipment:   Intra-op Plan:   Post-operative Plan:   Informed Consent: I have reviewed the patients History and Physical, chart, labs and discussed the procedure including the risks, benefits and alternatives for the proposed anesthesia with the patient or authorized representative who has indicated his/her understanding and acceptance.     Plan Discussed with: CRNA  Anesthesia Plan Comments:         Anesthesia Quick Evaluation

## 2018-02-05 NOTE — Progress Notes (Signed)
[employer] - [place] at [facility]                                                                                                                                                                                  Patient Demographics   Caleb Edwards, is a 67 y.o. male, DOB - Dec 26, 1950, LKG:[id number]  Admit date - 02/03/2018   Admitting Physician Oralia Manis, MD  Outpatient Primary MD for the patient is [facility], [facility]   LOS - 2  Subjective: Further bloody stools noted hemoglobin stable    Review of Systems:   CONSTITUTIONAL: No documented fever. No fatigue, weakness. No weight gain, no weight loss.  EYES: No blurry or double vision.  ENT: No tinnitus. No postnasal drip. No redness of the oropharynx.  RESPIRATORY: No cough, no wheeze, no hemoptysis. No dyspnea.  CARDIOVASCULAR: No chest pain. No orthopnea. No palpitations. No syncope.  GASTROINTESTINAL: No nausea, no vomiting or diarrhea. No abdominal pain.  Positive melena or hematochezia.  GENITOURINARY: No dysuria or hematuria.  ENDOCRINE: No polyuria or nocturia. No heat or cold intolerance.  HEMATOLOGY: No anemia. No bruising. No bleeding.  INTEGUMENTARY: No rashes. No lesions.  MUSCULOSKELETAL: No arthritis. No swelling. No gout.  NEUROLOGIC: No numbness, tingling, or ataxia. No seizure-type activity.  PSYCHIATRIC: No anxiety. No insomnia. No ADD.    Vitals:   Vitals:   02/04/18 1738 02/04/18 2003 02/05/18 0507 02/05/18 1225  BP: (!) 155/88 135/70 122/74 138/77  Pulse: 62 (!) 56 (!) 57 (!) 59  Resp: Temp: 98.2 F (36.8 C) 98.1 F (36.7 C) 98.5 F (36.9 C) 98 F (36.7 C)  TempSrc: Oral Oral Oral Tympanic  SpO2: 98% 100% 100% 100%  Weight:      Height:        Wt Readings from Last 3 Encounters:  02/04/18 78 kg (172 lb)  01/23/18 81.6 kg (180 lb)  09/20/17 78 kg (172 lb)     Intake/Output Summary (Last 24 hours) at 02/05/2018 1342 Last data filed at 02/05/2018  0800 Gross per 24 hour  Intake 896 ml  Output 826 ml  Net 70 ml    Physical Exam:   GENERAL: Pleasant-appearing in no apparent distress.  HEAD, EYES, EARS, NOSE AND THROAT: Atraumatic, normocephalic. Extraocular muscles are intact. Pupils equal and reactive to light. Sclerae anicteric. No conjunctival injection. No oro-pharyngeal erythema.  NECK: Supple. There is no jugular venous distention. No bruits, no lymphadenopathy, no thyromegaly.  HEART: Regular rate and rhythm,. No murmurs, no rubs, no clicks.  LUNGS: Clear to auscultation bilaterally. No rales or rhonchi. No wheezes.  ABDOMEN: Soft, flat, nontender, nondistended. Has  good bowel sounds. No hepatosplenomegaly appreciated.  EXTREMITIES: No evidence of any cyanosis, clubbing, or peripheral edema.  +2 pedal and radial pulses bilaterally.  NEUROLOGIC: The patient is alert, awake, and oriented x3 with no focal motor or sensory deficits appreciated bilaterally.  SKIN: Moist and warm with no rashes appreciated.  Psych: Not anxious, depressed LN: No inguinal LN enlargement    Antibiotics   Anti-infectives (From admission, onward)   None      Medications   Scheduled Meds: . [MAR Hold] amLODipine  10 mg Oral Daily  . [MAR Hold] atorvastatin  20 mg Oral QHS  . [MAR Hold] lisinopril  40 mg Oral Daily  . [MAR Hold] pantoprazole  40 mg Intravenous Q12H   Continuous Infusions: . [MAR Hold] sodium chloride    . sodium chloride    . sodium chloride    . pantoprozole (PROTONIX) infusion 8 mg/hr (02/05/18 0720)   PRN Meds:.[MAR Hold] acetaminophen **OR** [MAR Hold] acetaminophen, [MAR Hold]  morphine injection, [MAR Hold] ondansetron **OR** [MAR Hold] ondansetron (ZOFRAN) IV, [MAR Hold] oxyCODONE   Data Review:   Micro Results No results found for this or any previous visit (from the past 240 hour(s)).  Radiology Reports Ct Abdomen Pelvis W Contrast  Result Date: 01/23/2018 CLINICAL DATA:  Abdominal pain and rectal  bleeding for 2 days. Diarrhea. Diverticulosis. Nephrolithiasis. EXAM: CT ABDOMEN AND PELVIS WITH CONTRAST TECHNIQUE: Multidetector CT imaging of the abdomen and pelvis was performed using the standard protocol following bolus administration of intravenous contrast. CONTRAST:  ISOVUE-300 IOPAMIDOL (ISOVUE-300) INJECTION 61% COMPARISON:  06/07/2017 FINDINGS: Lower Chest: No acute findings. Hepatobiliary: No hepatic masses identified. Gallbladder is unremarkable. Pancreas:  No mass or inflammatory changes. Spleen: Within normal limits in size and appearance. Adrenals/Urinary Tract: No masses identified. No evidence of hydronephrosis. Small bilateral nonobstructing renal calculi again seen. Tiny bilateral renal cysts are unchanged. Unremarkable unopacified urinary bladder. Stomach/Bowel: No evidence of obstruction, inflammatory process or abnormal fluid collections. Normal appendix visualized. Diffuse colonic diverticulosis is again seen, however there is no evidence of diverticulitis. Vascular/Lymphatic: No pathologically enlarged lymph nodes. No abdominal aortic aneurysm. Aortic atherosclerosis. Reproductive:  Stable mildly enlarged prostate. Other:  None. Musculoskeletal: No suspicious bone lesions identified. Stable lumbar spondylosis with grade 1 anterolisthesis at L5-S1. IMPRESSION: Colonic diverticulosis, without radiographic evidence of diverticulitis or other acute findings. Bilateral nonobstructing nephrolithiasis. Stable mildly enlarged prostate. Electronically Signed   By: Myles Rosenthal M.D.   On: 01/23/2018 12:08     CBC Recent Labs  Lab 02/03/18 1723 02/04/18 0055 02/04/18 0623 02/05/18 0509  WBC 6.2  --  5.1 6.6  HGB 8.1* 7.9* 7.6* 9.3*  HCT 25.0*  --  23.7* 27.7*  PLT 275  --  232 240  MCV 97.4  --  96.7 94.2  MCH 31.5  --  31.0 31.8  MCHC 32.4  --  32.1 33.7  RDW 19.6*  --  19.2* 19.0*    Chemistries  Recent Labs  Lab 02/03/18 1723 02/04/18 0623 02/05/18 0509  NA 137 138  139  K 3.6 3.5 3.9  CL 104 105 107  CO2 GLUCOSE 92 112* 100*  BUN CREATININE 1.34* 1.26* 1.04  CALCIUM 8.6* 7.8* 8.0*  AST 21  --   --   ALT 16*  --   --   ALKPHOS 65  --   --   BILITOT 0.3  --   --    ------------------------------------------------------------------------------------------------------------------ estimated creatinine clearance is  77.7 mL/min (by C-G formula based on SCr of 1.04 mg/dL). ------------------------------------------------------------------------------------------------------------------ No results for input(s): HGBA1C in the last 72 hours. ------------------------------------------------------------------------------------------------------------------ No results for input(s): CHOL, HDL, LDLCALC, TRIG, CHOLHDL, LDLDIRECT in the last 72 hours. ------------------------------------------------------------------------------------------------------------------ No results for input(s): TSH, T4TOTAL, T3FREE, THYROIDAB in the last 72 hours.  Invalid input(s): FREET3 ------------------------------------------------------------------------------------------------------------------ No results for input(s): VITAMINB12, FOLATE, FERRITIN, TIBC, IRON, RETICCTPCT in the last 72 hours.  Coagulation profile No results for input(s): INR, PROTIME in the last 168 hours.  No results for input(s): DDIMER in the last 72 hours.  Cardiac Enzymes No results for input(s): CKMB, TROPONINI, MYOGLOBIN in the last 168 hours.  Invalid input(s): CK ------------------------------------------------------------------------------------------------------------------ Invalid input(s): POCBNP    Assessment & Plan  Patient is a 67 year old with recent admission for GI bleed readmitted with GI bleed  #1 GI bleed possible upper continue IV Protonix Patient undergoing endoscopy and a colonoscopy today if negative will have a capsule endoscopy tomorrow   #2 acute  blood loss anemia hemoglobin, status post transfusion   #3   HTN (hypertension) -continue home meds blood pressure stable  #4 CKD (chronic kidney disease), stage III (HCC) -at baseline, avoid nephrotoxins and monitor        Code Status Orders  (From admission, onward)        Start     Ordered   02/04/18 0044  Full code  Continuous     02/04/18 0043    Code Status History    Date Active Date Inactive Code Status Order ID Comments User Context   01/23/2018 1226 01/24/2018 1827 Full Code [id number]  Shaune Pollack, MD Inpatient   07/31/2017 1326 08/02/2017 1907 Full Code [id number]  Marguarite Arbour, MD Inpatient   01/02/2016 1814 01/04/2016 1420 Full Code [id number]  Ramonita Lab, MD ED           Consults gastroenterology   DVT Prophylaxis SCDs  Lab Results  Component Value Date   PLT 240 02/05/2018     Time Spent in minutes35 minutes greater than 50% of time spent in care coordination and counseling patient regarding the condition and plan of care.   Auburn Bilberry M.D on 02/05/2018 at 1:42 PM  Between 7am to 6pm - Pager - [phone]  After 6pm go to [email or web address] - Social research officer, government  [employer]   Office  [phone]

## 2018-02-05 NOTE — Op Note (Signed)
[facility] Gastroenterology Patient Name: Caleb Edwards Procedure Date: 02/05/2018 12:35 PM MRN: [id number] Account #: [id number] Date of Birth: May 17, 1951 Admit Type: Inpatient Age: 67 Room: [facility] ENDO ROOM 4 Gender: Male Note Status: Finalized Procedure:            Colonoscopy Indications:          Rectal bleeding Providers:            Wyline Mood MD, MD Medicines:            Monitored Anesthesia Care Complications:        No immediate complications. Procedure:            Pre-Anesthesia Assessment:                       - Prior to the procedure, a History and Physical was                        performed, and patient medications, allergies and                        sensitivities were reviewed. The patient's tolerance of                        previous anesthesia was reviewed.                       - The risks and benefits of the procedure and the                        sedation options and risks were discussed with the                        patient. All questions were answered and informed                        consent was obtained.                       - ASA Grade Assessment: III - A patient with severe                        systemic disease.                       After obtaining informed consent, the colonoscope was                        passed under direct vision. Throughout the procedure,                        the patient's blood pressure, pulse, and oxygen                        saturations were monitored continuously. The                        Colonoscope was introduced through the anus and                        advanced to the the terminal ileum. The colonoscopy was  performed with ease. The patient tolerated the                        procedure well. The quality of the bowel preparation                        was good. Findings:      The perianal and digital rectal examinations were normal.      Multiple small-mouthed  diverticula were found in the entire colon.      Non-bleeding internal hemorrhoids were found during retroflexion. The       hemorrhoids were medium-sized and Grade I (internal hemorrhoids that do       not prolapse).      The terminal ileum appeared normal. Impression:           - Diverticulosis in the entire examined colon.                       - Non-bleeding internal hemorrhoids.                       - The examined portion of the ileum was normal.                       - No specimens collected. Recommendation:       - Perform an upper GI endoscopy today. Procedure Code(s):    --- Professional ---                       [phone], Colonoscopy, flexible; diagnostic, including                        collection of specimen(s) by brushing or washing, when                        performed (separate procedure) Diagnosis Code(s):    --- Professional ---                       K64.0, First degree hemorrhoids                       K62.5, Hemorrhage of anus and rectum                       K57.30, Diverticulosis of large intestine without                        perforation or abscess without bleeding CPT copyright 2017 American Medical Association. All rights reserved. The codes documented in this report are preliminary and upon coder review may  be revised to meet current compliance requirements. Wyline Mood, MD Wyline Mood MD, MD 02/05/2018 1:51:09 PM This report has been signed electronically. Number of Addenda: 0 Note Initiated On: 02/05/2018 12:35 PM Scope Withdrawal Time: 0 hours 6 minutes 43 seconds  Total Procedure Duration: 0 hours 10 minutes 2 seconds       [facility]

## 2018-02-05 NOTE — Transfer of Care (Signed)
Immediate Anesthesia Transfer of Care Note  Patient: Caleb Edwards  Procedure(s) Performed: ESOPHAGOGASTRODUODENOSCOPY (EGD) WITH PROPOFOL (N/A ) COLONOSCOPY WITH PROPOFOL (N/A )  Patient Location: PACU  Anesthesia Type:General  Level of Consciousness: sedated  Airway & Oxygen Therapy: Patient Spontanous Breathing and Patient connected to nasal cannula oxygen  Post-op Assessment: Report given to RN and Post -op Vital signs reviewed and stable  Post vital signs: Reviewed and stable  Last Vitals:  Vitals Value Taken Time  BP 104/58 02/05/2018  2:02 PM  Temp    Pulse 58 02/05/2018  2:02 PM  Resp 23 02/05/2018  2:02 PM  SpO2 100 % 02/05/2018  2:02 PM    Last Pain:  Vitals:   02/05/18 1225  TempSrc: Tympanic  PainSc: 9          Complications: No apparent anesthesia complications

## 2018-02-05 NOTE — H&P (Signed)
Caleb Mood, MD [address], [place], [place], [address] [address], [place], [place], [address] Phone: [phone]  Fax: [phone]  Primary Care Physician:  [facility], [facility]   Pre-Procedure History & Physical: HPI:  Caleb Edwards is a 67 y.o. male is here for an endoscopy and colonoscopy    Past Medical History:  Diagnosis Date  . Back pain   . Blood transfusion without reported diagnosis 12/2015   bleeding ulcer hospitalization.   . Hypertension   . Ulcer, stomach peptic, chronic     Past Surgical History:  Procedure Laterality Date  . COLONOSCOPY WITH PROPOFOL N/A 08/02/2017   Procedure: COLONOSCOPY WITH PROPOFOL;  Surgeon: Midge Minium, MD;  Location: [facility] ENDOSCOPY;  Service: Endoscopy;  Laterality: N/A;  . ESOPHAGOGASTRODUODENOSCOPY (EGD) WITH PROPOFOL N/A 01/03/2016   Procedure: ESOPHAGOGASTRODUODENOSCOPY (EGD) WITH PROPOFOL;  Surgeon: Scot Jun, MD;  Location: [facility] ENDOSCOPY;  Service: Endoscopy;  Laterality: N/A;  . ESOPHAGOGASTRODUODENOSCOPY (EGD) WITH PROPOFOL  08/02/2017   Procedure: ESOPHAGOGASTRODUODENOSCOPY (EGD) WITH PROPOFOL;  Surgeon: Midge Minium, MD;  Location: [facility] ENDOSCOPY;  Service: Endoscopy;;    Prior to Admission medications   Medication Sig Start Date End Date Taking? Authorizing Provider  amLODipine (NORVASC) 10 MG tablet Take 1 tablet by mouth daily. 12/31/17  Yes [provider]  atorvastatin (LIPITOR) 20 MG tablet Take 1 tablet by mouth at bedtime. 12/31/17  Yes [provider]  chlorthalidone (HYGROTON) 50 MG tablet Take 1 tablet by mouth daily. 12/31/17  Yes [provider]  ferrous sulfate 325 (65 FE) MG tablet Take 325 mg by mouth daily with breakfast.   Yes [provider]  lisinopril (PRINIVIL,ZESTRIL) 40 MG tablet Take 1 tablet by mouth daily. 12/31/17  Yes [provider]  pantoprazole (PROTONIX) 40 MG tablet Take 1 tablet (40 mg  total) by mouth 2 (two) times daily before a meal. 01/04/16  Yes Shaune Pollack, MD  polyethylene glycol [facility] / Ethelene Hal) packet Take 17 g by mouth daily.   Yes [provider]  sildenafil (VIAGRA) 100 MG tablet Take 100 mg by mouth daily as needed for erectile dysfunction.   Yes [provider]  traMADol (ULTRAM) 50 MG tablet Take 2 tablets (100 mg total) by mouth every 6 (six) hours as needed for severe pain. Patient taking differently: Take 100 mg by mouth every 4 (four) hours as needed for severe pain (back pain).  09/22/17 03/21/18 Yes Barbette Merino, NP    Allergies as of 02/03/2018  . (No Known Allergies)    Family History  Problem Relation Age of Onset  . Diabetes Mother   . Heart disease Father   . Prostate cancer Brother   . Bladder Cancer Neg Hx   . Kidney cancer Neg Hx     Social History   Socioeconomic History  . Marital status: Married    Spouse name: Not on file  . Number of children: Not on file  . Years of education: Not on file  . Highest education level: Not on file  Occupational History  . Not on file  Social Needs  . Financial resource strain: Not on file  . Food insecurity:    Worry: Not on file    Inability: Not on file  . Transportation needs:    Medical: Not on file    Non-medical: Not on file  Tobacco Use  . Smoking status: Former Games developer  .  Smokeless tobacco: Never Used  Substance and Sexual Activity  . Alcohol use: No    Alcohol/week: 0.0 oz  . Drug use: No  . Sexual activity: Not on file  Lifestyle  . Physical activity:    Days per week: Not on file    Minutes per session: Not on file  . Stress: Not on file  Relationships  . Social connections:    Talks on phone: Not on file    Gets together: Not on file    Attends religious service: Not on file    Active member of club or organization: Not on file    Attends meetings of clubs or organizations: Not on file    Relationship status: Not on file  . Intimate partner  violence:    Fear of current or ex partner: Not on file    Emotionally abused: Not on file    Physically abused: Not on file    Forced sexual activity: Not on file  Other Topics Concern  . Not on file  Social History Narrative  . Not on file    Review of Systems: See HPI, otherwise negative ROS  Physical Exam: BP 138/77   Pulse (!) 59   Temp 98 F (36.7 C) (Tympanic)   Resp 16   Ht 6' (1.829 m)   Wt 172 lb (78 kg)   SpO2 100%   BMI 23.33 kg/m  General:   Alert,  pleasant and cooperative in NAD Head:  Normocephalic and atraumatic. Neck:  Supple; no masses or thyromegaly. Lungs:  Clear throughout to auscultation, normal respiratory effort.    Heart:  +S1, +S2, Regular rate and rhythm, No edema. Abdomen:  Soft, nontender and nondistended. Normal bowel sounds, without guarding, and without rebound.   Neurologic:  Alert and  oriented x4;  grossly normal neurologically.  Impression/Plan: Asa Saunas is here for an endoscopy and colonoscopy  to be performed for  evaluation of GI bleed    Risks, benefits, limitations, and alternatives regarding endoscopy have been reviewed with the patient.  Questions have been answered.  All parties agreeable.   Caleb Mood, MD  02/05/2018, 12:53 PM

## 2018-02-05 NOTE — Anesthesia Procedure Notes (Signed)
Performed by: Chaniya Genter, CRNA Pre-anesthesia Checklist: Patient identified, Emergency Drugs available, Suction available, Patient being monitored and Timeout performed Patient Re-evaluated:Patient Re-evaluated prior to induction Oxygen Delivery Method: Nasal cannula Induction Type: IV induction       

## 2018-02-05 NOTE — Anesthesia Post-op Follow-up Note (Signed)
Anesthesia QCDR form completed.        

## 2018-02-05 NOTE — Op Note (Signed)
[facility] Gastroenterology Patient Name: Caleb Edwards Procedure Date: 02/05/2018 12:36 PM MRN: [id number] Account #: [id number] Date of Birth: 1951-06-02 Admit Type: Inpatient Age: 67 Room: [facility] ENDO ROOM 4 Gender: Male Note Status: Finalized Procedure:            Upper GI endoscopy Indications:          Hematochezia Providers:            Wyline Mood MD, MD Medicines:            Monitored Anesthesia Care Complications:        No immediate complications. Procedure:            Pre-Anesthesia Assessment:                       - Prior to the procedure, a History and Physical was                        performed, and patient medications, allergies and                        sensitivities were reviewed. The patient's tolerance of                        previous anesthesia was reviewed.                       - The risks and benefits of the procedure and the                        sedation options and risks were discussed with the                        patient. All questions were answered and informed                        consent was obtained.                       - ASA Grade Assessment: III - A patient with severe                        systemic disease.                       After obtaining informed consent, the endoscope was                        passed under direct vision. Throughout the procedure,                        the patient's blood pressure, pulse, and oxygen                        saturations were monitored continuously. The Endoscope                        was introduced through the mouth, and advanced to the                        second part of duodenum. The upper GI endoscopy was  accomplished with ease. The patient tolerated the                        procedure well. Findings:      The stomach was normal.      The esophagus was normal.      An acquired benign-appearing, intrinsic severe stenosis was found in the   third portion of the duodenum and was non-traversed.      The cardia and gastric fundus were normal on retroflexion. Impression:           - Normal stomach.                       - Normal esophagus.                       - Acquired duodenal stenosis.                       - No specimens collected. Recommendation:       - Return patient to hospital ward for ongoing care.                       - Advance diet as tolerated.                       - Continue present medications.                       - 1. No active bleeding at this time noted - stool in                        color was brown                       2. Likely either a diverticular bleed which is most                        likely the reason , other differentials are small bowel                        bleed from AVM's which usually causes an iron                        deficiency anemia or from a meckels diverticulum.                       3. If has further bleed consider Meckels scan                       4. F/u in the outpatient to discuss need for any                        duodenal dilation for stricture                       5. IF stable today and Hb stable , no further bleeding                        can go home tomorrow. Procedure Code(s):    --- Professional ---  82956, Esophagogastroduodenoscopy, flexible, transoral;                        diagnostic, including collection of specimen(s) by                        brushing or washing, when performed (separate procedure) Diagnosis Code(s):    --- Professional ---                       K31.5, Obstruction of duodenum                       K92.1, Melena (includes Hematochezia) CPT copyright 2017 American Medical Association. All rights reserved. The codes documented in this report are preliminary and upon coder review may  be revised to meet current compliance requirements. Wyline Mood, MD Wyline Mood MD, MD 02/05/2018 2:03:12 PM This report has been signed  electronically. Number of Addenda: 0 Note Initiated On: 02/05/2018 12:36 PM      [facility]

## 2018-02-05 NOTE — Anesthesia Postprocedure Evaluation (Signed)
Anesthesia Post Note  Patient: Caleb Edwards  Procedure(s) Performed: ESOPHAGOGASTRODUODENOSCOPY (EGD) WITH PROPOFOL (N/A ) COLONOSCOPY WITH PROPOFOL (N/A )  Patient location during evaluation: Endoscopy Anesthesia Type: General Level of consciousness: awake and alert Pain management: pain level controlled Vital Signs Assessment: post-procedure vital signs reviewed and stable Respiratory status: spontaneous breathing, nonlabored ventilation, respiratory function stable and patient connected to nasal cannula oxygen Cardiovascular status: blood pressure returned to baseline and stable Postop Assessment: no apparent nausea or vomiting Anesthetic complications: no     Last Vitals:  Vitals:   02/05/18 1429 02/05/18 1447  BP: 110/74 122/65  Pulse: (!) 50 (!) 48  Resp: 15 18  Temp:    SpO2: 100% 98%    Last Pain:  Vitals:   02/05/18 1401  TempSrc: Tympanic  PainSc:                  Burris Matherne S

## 2018-02-06 ENCOUNTER — Encounter: Payer: Self-pay | Admitting: Gastroenterology

## 2018-02-06 LAB — CBC
HEMATOCRIT: 26.9 % — AB (ref 40.0–52.0)
Hemoglobin: 9 g/dL — ABNORMAL LOW (ref 13.0–18.0)
MCH: 31.4 pg (ref 26.0–34.0)
MCHC: 33.4 g/dL (ref 32.0–36.0)
MCV: 93.9 fL (ref 80.0–100.0)
PLATELETS: 217 10*3/uL (ref 150–440)
RBC: 2.87 MIL/uL — AB (ref 4.40–5.90)
RDW: 18 % — ABNORMAL HIGH (ref 11.5–14.5)
WBC: 6.5 10*3/uL (ref 3.8–10.6)

## 2018-02-06 LAB — BASIC METABOLIC PANEL
ANION GAP: 6 (ref 5–15)
BUN: 12 mg/dL (ref 6–20)
CO2: 25 mmol/L (ref 22–32)
Calcium: 7.9 mg/dL — ABNORMAL LOW (ref 8.9–10.3)
Chloride: 106 mmol/L (ref 101–111)
Creatinine, Ser: 1.18 mg/dL (ref 0.61–1.24)
GFR calc Af Amer: >60 mL/min (ref >60)
Glucose, Bld: 103 mg/dL — ABNORMAL HIGH (ref 65–99)
POTASSIUM: 3.6 mmol/L (ref 3.5–5.1)
Sodium: 137 mmol/L (ref 135–145)

## 2018-02-06 MED ORDER — POLYETHYLENE GLYCOL 3350 17 G PO PACK: 17.0000 g | PACK | Freq: Every day | ORAL | 0 refills | Status: DC

## 2018-02-06 NOTE — Care Management Important Message (Signed)
Copy of signed IM left in patient's room.    

## 2018-02-06 NOTE — Progress Notes (Signed)
Discharge instructions reviewed with patient and wife.  Understanding was verbalized and all questions were answered.  Patient discharged via wheelchair in stable condition escorted by volunteer staff.

## 2018-02-06 NOTE — Discharge Summary (Signed)
[facility] - [place] at [facility], 67 y.o., DOB Jan 24, 1951, MRN [id number]. Admission date: 02/03/2018 Discharge Date 02/06/2018 [facility], [facility] Admitting Physician Oralia Manis, MD  Admission Diagnosis  referred by MD/low hemoglobin  Discharge Diagnosis   Principal Problem: GI bleed felt to be due to diverticular by GI status post EGD and colonoscopy Acute blood loss anemia status post 1 unit of transfusion Essential hypertension Chronic kidney disease stage III Diverticulosis      Hospital Course Patient is a 67 year old with recent admission for GI bleed readmitted with GI bleed.  Patient was recent recently hospitalized  to be due to diverticular bleed.  Patient is presented again with complaint of blood in the stool.  Patient's hemoglobin did drop so he had to receive 1 unit of packed RBCs.  He was seen by gastroenterology and underwent endoscopy and a colonoscopy.  Patient's EGD showed duodenal stenosis.  Dr. Tobi Bastos stated that since patient was asymptomatic no need to do anything for the duodenal stenosis unless patient has symptoms.  Colonoscopy did show diverticulosis and internal hemorrhoids.  No active bleeding was noted.  GI recommended outpatient follow-up with possible capsule endoscopy if he rebleeds.             Consults gastroenterology  Significant Tests:  See full reports for all details    Ct Abdomen Pelvis W Contrast  Result Date: 01/23/2018 CLINICAL DATA:  Abdominal pain and rectal bleeding for 2 days. Diarrhea. Diverticulosis. Nephrolithiasis. EXAM: CT ABDOMEN AND PELVIS WITH CONTRAST TECHNIQUE: Multidetector CT imaging of the abdomen and pelvis was performed using the standard protocol following bolus administration of intravenous contrast. CONTRAST:  ISOVUE-300 IOPAMIDOL (ISOVUE-300) INJECTION 61% COMPARISON:  06/07/2017 FINDINGS: Lower Chest: No acute findings. Hepatobiliary: No hepatic masses  identified. Gallbladder is unremarkable. Pancreas:  No mass or inflammatory changes. Spleen: Within normal limits in size and appearance. Adrenals/Urinary Tract: No masses identified. No evidence of hydronephrosis. Small bilateral nonobstructing renal calculi again seen. Tiny bilateral renal cysts are unchanged. Unremarkable unopacified urinary bladder. Stomach/Bowel: No evidence of obstruction, inflammatory process or abnormal fluid collections. Normal appendix visualized. Diffuse colonic diverticulosis is again seen, however there is no evidence of diverticulitis. Vascular/Lymphatic: No pathologically enlarged lymph nodes. No abdominal aortic aneurysm. Aortic atherosclerosis. Reproductive:  Stable mildly enlarged prostate. Other:  None. Musculoskeletal: No suspicious bone lesions identified. Stable lumbar spondylosis with grade 1 anterolisthesis at L5-S1. IMPRESSION: Colonic diverticulosis, without radiographic evidence of diverticulitis or other acute findings. Bilateral nonobstructing nephrolithiasis. Stable mildly enlarged prostate. Electronically Signed   By: Myles Rosenthal M.D.   On: 01/23/2018 12:08       Today   Subjective:   Caleb Edwards feeling better denies any complaints Objective:   Blood pressure 132/76, pulse (!) 59, temperature 98.3 F (36.8 C), temperature source Oral, resp. rate 17, height 6' (1.829 m), weight 78 kg (172 lb), SpO2 100 %.  .  Intake/Output Summary (Last 24 hours) at 02/06/2018 1453 Last data filed at 02/06/2018 1017 Gross per 24 hour  Intake 1072 ml  Output 1025 ml  Net 47 ml    Exam VITAL SIGNS: Blood pressure 132/76, pulse (!) 59, temperature 98.3 F (36.8 C), temperature source Oral, resp. rate 17, height 6' (1.829 m), weight 78 kg (172 lb), SpO2 100 %.  GENERAL:  67 y.o.-year-old patient lying in the bed with no acute distress.  EYES: Pupils equal, round, reactive to light and accommodation. No scleral icterus. Extraocular muscles intact.  HEENT: Head  atraumatic, normocephalic. Oropharynx and nasopharynx clear.  NECK:  Supple, no jugular venous distention. No thyroid enlargement, no tenderness.  LUNGS: Normal breath sounds bilaterally, no wheezing, rales,rhonchi or crepitation. No use of accessory muscles of respiration.  CARDIOVASCULAR: S1, S2 normal. No murmurs, rubs, or gallops.  ABDOMEN: Soft, nontender, nondistended. Bowel sounds present. No organomegaly or mass.  EXTREMITIES: No pedal edema, cyanosis, or clubbing.  NEUROLOGIC: Cranial nerves II through XII are intact. Muscle strength 5/5 in all extremities. Sensation intact. Gait not checked.  PSYCHIATRIC: The patient is alert and oriented x 3.  SKIN: No obvious rash, lesion, or ulcer.   Data Review     CBC w Diff:  Lab Results  Component Value Date   WBC 6.5 02/06/2018   HGB 9.0 (L) 02/06/2018   HCT 26.9 (L) 02/06/2018   PLT 217 02/06/2018   CMP:  Lab Results  Component Value Date   NA 137 02/06/2018   K 3.6 02/06/2018   CL 106 02/06/2018   CO2 25 02/06/2018   BUN 12 02/06/2018   CREATININE 1.18 02/06/2018   PROT 6.9 02/03/2018   ALBUMIN 3.8 02/03/2018   BILITOT 0.3 02/03/2018   ALKPHOS 65 02/03/2018   AST 21 02/03/2018   ALT 16 (L) 02/03/2018  .  Micro Results No results found for this or any previous visit (from the past 240 hour(s)).      Code Status Orders  (From admission, onward)        Start     Ordered   02/04/18 0044  Full code  Continuous     02/04/18 0043    Code Status History    Date Active Date Inactive Code Status Order ID Comments User Context   01/23/2018 1226 01/24/2018 1827 Full Code [id number]  Shaune Pollack, MD Inpatient   07/31/2017 1326 08/02/2017 1907 Full Code [id number]  Marguarite Arbour, MD Inpatient   01/02/2016 1814 01/04/2016 1420 Full Code [id number]  Ramonita Lab, MD ED          Follow-up Information    Inc, [facility]. Go on 02/14/2018.   Why:  Tuesday at 8:20am for CBC re-check/hospital  follow-up Contact information: [address] [place] [address] [phone]        Wyline Mood, MD. Go on 02/13/2018.   Specialty:  Gastroenterology Why:  Monday at 2:30pm for GI bleed/hospital follow-up Contact information: [address] [place] [address] [phone]           Discharge Medications   Allergies as of 02/06/2018   No Known Allergies     Medication List    TAKE these medications   amLODipine 10 MG tablet Commonly known as:  NORVASC Take 1 tablet by mouth daily.   atorvastatin 20 MG tablet Commonly known as:  LIPITOR Take 1 tablet by mouth at bedtime.   chlorthalidone 50 MG tablet Commonly known as:  HYGROTON Take 1 tablet by mouth daily.   ferrous sulfate 325 (65 FE) MG tablet Take 325 mg by mouth daily with breakfast.   lisinopril 40 MG tablet Commonly known as:  PRINIVIL,ZESTRIL Take 1 tablet by mouth daily.   pantoprazole 40 MG tablet Commonly known as:  PROTONIX Take 1 tablet (40 mg total) by mouth 2 (two) times daily before a meal.   polyethylene glycol packet Commonly known as:  MIRALAX / GLYCOLAX Take 17 g by mouth daily.   sildenafil 100 MG tablet Commonly known as:  VIAGRA Take  100 mg by mouth daily as needed for erectile dysfunction.   traMADol 50 MG tablet Commonly known as:  ULTRAM Take 2 tablets (100 mg total) by mouth every 6 (six) hours as needed for severe pain. What changed:    when to take this  reasons to take this          Total Time in preparing paper work, data evaluation and todays exam - 35 minutes  Auburn Bilberry M.D on 02/06/2018 at 2:53 PM [facility]   Office  [phone]

## 2018-02-06 NOTE — Care Management (Signed)
No discharge needs identified by members of the care team 

## 2018-02-08 ENCOUNTER — Ambulatory Visit: Payer: BLUE CROSS/BLUE SHIELD | Admitting: Gastroenterology

## 2018-02-13 ENCOUNTER — Encounter: Payer: Self-pay | Admitting: Gastroenterology

## 2018-02-13 ENCOUNTER — Ambulatory Visit (INDEPENDENT_AMBULATORY_CARE_PROVIDER_SITE_OTHER): Payer: Medicare HMO | Admitting: Gastroenterology

## 2018-02-13 ENCOUNTER — Telehealth: Payer: Self-pay

## 2018-02-13 ENCOUNTER — Other Ambulatory Visit
Admission: RE | Admit: 2018-02-13 | Discharge: 2018-02-13 | Disposition: A | Discharge status: Home / Self Care | Payer: Medicare HMO | Source: Ambulatory Visit | Attending: Gastroenterology | Admitting: Gastroenterology

## 2018-02-13 VITALS — BP 134/73 | HR 61 | Ht 72.0 in | Wt 171.4 lb

## 2018-02-13 DIAGNOSIS — K625 Hemorrhage of anus and rectum: Secondary | ICD-10-CM

## 2018-02-13 DIAGNOSIS — K315 Obstruction of duodenum: Secondary | ICD-10-CM

## 2018-02-13 LAB — CBC WITH DIFFERENTIAL/PLATELET
BASOS ABS: 0.1 10*3/uL (ref 0–0.1)
BASOS PCT: 1 %
EOS ABS: 0.1 10*3/uL (ref 0–0.7)
EOS PCT: 1 %
HCT: 33.5 % — ABNORMAL LOW (ref 40.0–52.0)
HEMOGLOBIN: 11.3 g/dL — AB (ref 13.0–18.0)
LYMPHS ABS: 1.2 10*3/uL (ref 1.0–3.6)
Lymphocytes Relative: 18 %
MCH: 30.8 pg (ref 26.0–34.0)
MCHC: 33.5 g/dL (ref 32.0–36.0)
MCV: 91.9 fL (ref 80.0–100.0)
Monocytes Absolute: 0.7 10*3/uL (ref 0.2–1.0)
Monocytes Relative: 11 %
Neutro Abs: 4.6 10*3/uL (ref 1.4–6.5)
Neutrophils Relative %: 69 %
Platelets: 288 10*3/uL (ref 150–440)
RBC: 3.65 MIL/uL — AB (ref 4.40–5.90)
RDW: 14.9 % — ABNORMAL HIGH (ref 11.5–14.5)
WBC: 6.7 10*3/uL (ref 3.8–10.6)

## 2018-02-13 NOTE — Addendum Note (Signed)
Addended by: Ardyth Man on: 02/13/2018 03:38 PM   Modules accepted: Orders, SmartSet

## 2018-02-13 NOTE — Telephone Encounter (Signed)
Flagged on EMMI report for not knowing who to call about changes in condition.  1st attempt to reach patient made 02/13/18 at 4:18pm, however no answer.  Unable to leave voicemail.  Will attempt at later time.

## 2018-02-13 NOTE — Progress Notes (Signed)
Wyline Mood MD, MRCP(U.K) [address]  [address]  [place], [place] [address]  Main: [phone]  Fax: [phone]   Primary Care Physician: Inc, [facility]  Primary Gastroenterologist:  Dr. Wyline Mood   No chief complaint on file.   HPI: Caleb Edwards is a 67 y.o. male   Summary of history :  He is here today to follow-up to his recent hospital admission on 02/03/2018 when he presented with melena.  He is actually a patient of Dr. Daleen Squibb who underwent an upper endoscopy and colonoscopy in 2018.  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  The colonoscopy demonstrated pancolonic diverticulosis.  In the recent past he was actually admitted on April 22 for rectal bleeding of 2 days duration, clinically F and that he had a diverticular bleed and since it is resolved did not perform any further intervention.  He was readmitted on 02/03/2018 with similar complaints a drop of hemoglobin from 10.5 g to 8 g.  I performed an EGD and colonoscopy at the same time.  The upper endoscopy demonstrated a benign-appearing severe stenosis in the third portion of the duodenum which I could not transverse.  I also performed a colonoscopy which showed diverticulosis throughout the colon with internal hemorrhoids that were nonbleeding and medium in size.  At the time of his discharge his hemoglobin was 9 g with an MCV of 93.9 and I felt that his bleeding again was from diverticular disease as there was no microcytosis to indicate a chronic iron deficiency anemia.  Interval history   2 weeks  No further bleeding , has some constipation , Miralax not working. He wants to know where he bled from and wants to be sure no issues with small bowel . No issues with food going down.    Current Outpatient Medications  Medication Sig Dispense Refill  . amLODipine (NORVASC) 10 MG tablet Take 1 tablet by mouth daily.  3  . atorvastatin (LIPITOR) 20 MG tablet Take 1 tablet by  mouth at bedtime.  3  . chlorthalidone (HYGROTON) 50 MG tablet Take 1 tablet by mouth daily.  3  . ferrous sulfate 325 (65 FE) MG tablet Take 325 mg by mouth daily with breakfast.    . lisinopril (PRINIVIL,ZESTRIL) 40 MG tablet Take 1 tablet by mouth daily.  3  . pantoprazole (PROTONIX) 40 MG tablet Take 1 tablet (40 mg total) by mouth 2 (two) times daily before a meal. 60 tablet 0  . polyethylene glycol (MIRALAX / GLYCOLAX) packet Take 17 g by mouth daily. 30 each 0  . sildenafil (VIAGRA) 100 MG tablet Take 100 mg by mouth daily as needed for erectile dysfunction.    . traMADol (ULTRAM) 50 MG tablet Take 2 tablets (100 mg total) by mouth every 6 (six) hours as needed for severe pain. (Patient taking differently: Take 100 mg by mouth every 4 (four) hours as needed for severe pain (back pain). ) 240 tablet 5   No current facility-administered medications for this visit.     Allergies as of 02/13/2018  . (No Known Allergies)    ROS:  General: Negative for anorexia, weight loss, fever, chills, fatigue, weakness. ENT: Negative for hoarseness, difficulty swallowing , nasal congestion. CV: Negative for chest pain, angina, palpitations, dyspnea on exertion, peripheral edema.  Respiratory: Negative for dyspnea at rest, dyspnea on exertion, cough, sputum, wheezing.  GI: See history of present illness. GU:  Negative for dysuria, hematuria, urinary incontinence, urinary frequency, nocturnal  urination.  Endo: Negative for unusual weight change.    Physical Examination:   There were no vitals taken for this visit.  General: Well-nourished, well-developed in no acute distress.  Eyes: No icterus. Conjunctivae pink. Mouth: Oropharyngeal mucosa moist and pink , no lesions erythema or exudate. Lungs: Clear to auscultation bilaterally. Non-labored. Heart: Regular rate and rhythm, no murmurs rubs or gallops.  Abdomen: Bowel sounds are normal, nontender, nondistended, no hepatosplenomegaly or masses,  no abdominal bruits or hernia , no rebound or guarding.   Extremities: No lower extremity edema. No clubbing or deformities. Neuro: Alert and oriented x 3.  Grossly intact. Skin: Warm and dry, no jaundice.   Psych: Alert and cooperative, normal mood and affect.   Imaging Studies: Ct Abdomen Pelvis W Contrast  Result Date: 01/23/2018 CLINICAL DATA:  Abdominal pain and rectal bleeding for 2 days. Diarrhea. Diverticulosis. Nephrolithiasis. EXAM: CT ABDOMEN AND PELVIS WITH CONTRAST TECHNIQUE: Multidetector CT imaging of the abdomen and pelvis was performed using the standard protocol following bolus administration of intravenous contrast. CONTRAST:  ISOVUE-300 IOPAMIDOL (ISOVUE-300) INJECTION 61% COMPARISON:  06/07/2017 FINDINGS: Lower Chest: No acute findings. Hepatobiliary: No hepatic masses identified. Gallbladder is unremarkable. Pancreas:  No mass or inflammatory changes. Spleen: Within normal limits in size and appearance. Adrenals/Urinary Tract: No masses identified. No evidence of hydronephrosis. Small bilateral nonobstructing renal calculi again seen. Tiny bilateral renal cysts are unchanged. Unremarkable unopacified urinary bladder. Stomach/Bowel: No evidence of obstruction, inflammatory process or abnormal fluid collections. Normal appendix visualized. Diffuse colonic diverticulosis is again seen, however there is no evidence of diverticulitis. Vascular/Lymphatic: No pathologically enlarged lymph nodes. No abdominal aortic aneurysm. Aortic atherosclerosis. Reproductive:  Stable mildly enlarged prostate. Other:  None. Musculoskeletal: No suspicious bone lesions identified. Stable lumbar spondylosis with grade 1 anterolisthesis at L5-S1. IMPRESSION: Colonic diverticulosis, without radiographic evidence of diverticulitis or other acute findings. Bilateral nonobstructing nephrolithiasis. Stable mildly enlarged prostate. Electronically Signed   By: Myles Rosenthal M.D.   On: 01/23/2018 12:08     Assessment and Plan:   Caleb Edwards is a 67 y.o. y/o male here to follow-up to his recent hospital discharge when he was admitted with melena felt to be due to a diverticular bleed.  Anoscopy showed pancolonic diverticulosis.  Upper endoscopy did not show any active bleeding but there was a severe stricture in the third portion of the duodenum which was not dilated at that point of time.  Plan  1. Check CBC to ensure it has not dropped 2. Dilate duodenal stricture and place video capsule to evaluate small bowel for source of bleeding.    I have discussed alternative options, risks & benefits,  which include, but are not limited to, bleeding, infection, perforation,respiratory complication & drug reaction.  The patient agrees with this plan & written consent will be obtained.    Risks, benefits, alternatives of Givens capsule discussed with patient to include but not limited to the rare risk of Given's capsule becoming lodged in the GI tract requiring surgical removal.  The patient agrees with this plan & consent will be obtained.    Dr Wyline Mood  MD,MRCP [facility]) Follow up in 12 weeks.

## 2018-02-13 NOTE — Progress Notes (Signed)
Patient was given samples of Kristalose  for constipation.

## 2018-02-14 NOTE — Telephone Encounter (Signed)
Reached upon 2nd attempt on 02/14/18 at 3:40pm.  Encouraged patient to reach out to Dr. Johnney Killian office at [facility] or his PCP at [facility] if he had any questions about changes in his condition.  Patient expressed understanding.  No further questions or concerns at this time.

## 2018-02-15 ENCOUNTER — Telehealth: Payer: Self-pay

## 2018-02-15 NOTE — Telephone Encounter (Signed)
LVM for patient callback for results per Dr. Tobi Bastos.    - Inform Hb has improved

## 2018-03-09 ENCOUNTER — Telehealth: Payer: Self-pay

## 2018-03-09 NOTE — Telephone Encounter (Signed)
Patient contacted office to reschedule his EGD.  He has been moved to 03/30/18 with Dr. Tobi BastosAnna.  Amelia in Endo has been informed.  I've provided patient with Endo contact # to call the day before for his arrival time, and instructed him nothing to eat or drink after midnight.  Thanks [employer]

## 2018-03-13 ENCOUNTER — Telehealth: Payer: Self-pay | Admitting: Gastroenterology

## 2018-03-13 NOTE — Telephone Encounter (Signed)
MICHALE WITH [facility] LEFT VM IN REGARDS TO PT PROCEDURE TO GET PRE AUTH  APT 03/14/18 CALL [phone] OPTION 0  EXT [phone]

## 2018-03-13 NOTE — Telephone Encounter (Signed)
Pt left vm he states he would like a clear understanding of his up com ing procedure

## 2018-03-13 NOTE — Telephone Encounter (Signed)
Returned phone call.   Gave patient the phone number to ENDO.

## 2018-03-14 ENCOUNTER — Ambulatory Visit: Payer: Medicare HMO | Attending: Nurse Practitioner | Admitting: Nurse Practitioner

## 2018-03-14 ENCOUNTER — Other Ambulatory Visit: Payer: Self-pay

## 2018-03-14 ENCOUNTER — Encounter: Payer: Self-pay | Admitting: Nurse Practitioner

## 2018-03-14 VITALS — BP 165/74 | HR 60 | Temp 98.1°F | Resp 16 | Ht 72.0 in | Wt 180.0 lb

## 2018-03-14 DIAGNOSIS — Z79899 Other long term (current) drug therapy: Secondary | ICD-10-CM | POA: Diagnosis not present

## 2018-03-14 DIAGNOSIS — M47812 Spondylosis without myelopathy or radiculopathy, cervical region: Secondary | ICD-10-CM | POA: Diagnosis not present

## 2018-03-14 DIAGNOSIS — G894 Chronic pain syndrome: Secondary | ICD-10-CM | POA: Diagnosis not present

## 2018-03-14 DIAGNOSIS — Z79891 Long term (current) use of opiate analgesic: Secondary | ICD-10-CM | POA: Diagnosis not present

## 2018-03-14 DIAGNOSIS — M47816 Spondylosis without myelopathy or radiculopathy, lumbar region: Secondary | ICD-10-CM | POA: Diagnosis not present

## 2018-03-14 DIAGNOSIS — Z9889 Other specified postprocedural states: Secondary | ICD-10-CM | POA: Diagnosis not present

## 2018-03-14 MED ORDER — TRAMADOL HCL 50 MG PO TABS: 100.0000 mg | ORAL_TABLET | Freq: Four times a day (QID) | ORAL | 5 refills | Status: DC | PRN

## 2018-03-14 NOTE — Progress Notes (Signed)
Nursing Pain Medication Assessment:  Safety precautions to be maintained throughout the outpatient stay will include: orient to surroundings, keep bed in low position, maintain call bell within reach at all times, provide assistance with transfer out of bed and ambulation.  Medication Inspection Compliance: Caleb Edwards did not comply with our request to bring his pills to be counted. He was reminded that bringing the medication bottles, even when empty, is a requirement.  Medication: None brought in. Pill/Patch Count: None available to be counted. Bottle Appearance: No container available. Did not bring bottle(s) to appointment. Filled Date: N/A Last Medication intake:  Today 

## 2018-03-14 NOTE — Patient Instructions (Addendum)
____________________________________________________________________________________________  Medication Rules  Applies to: All patients receiving prescriptions (written or electronic).  Pharmacy of record: Pharmacy where electronic prescriptions will be sent. If written prescriptions are taken to a different pharmacy, please inform the nursing staff. The pharmacy listed in the electronic medical record should be the one where you would like electronic prescriptions to be sent.  Prescription refills: Only during scheduled appointments. Applies to both, written and electronic prescriptions.  NOTE: The following applies primarily to controlled substances (Opioid* Pain Medications).   Patient's responsibilities: 1. Pain Pills: Bring all pain pills to every appointment (except for procedure appointments). 2. Pill Bottles: Bring pills in original pharmacy bottle. Always bring newest bottle. Bring bottle, even if empty. 3. Medication refills: You are responsible for knowing and keeping track of what medications you need refilled. The day before your appointment, write a list of all prescriptions that need to be refilled. Bring that list to your appointment and give it to the admitting nurse. Prescriptions will be written only during appointments. If you forget a medication, it will not be "Called in", "Faxed", or "electronically sent". You will need to get another appointment to get these prescribed. 4. Prescription Accuracy: You are responsible for carefully inspecting your prescriptions before leaving our office. Have the discharge nurse carefully go over each prescription with you, before taking them home. Make sure that your name is accurately spelled, that your address is correct. Check the name and dose of your medication to make sure it is accurate. Check the number of pills, and the written instructions to make sure they are clear and accurate. Make sure that you are given enough medication to last  until your next medication refill appointment. 5. Taking Medication: Take medication as prescribed. Never take more pills than instructed. Never take medication more frequently than prescribed. Taking less pills or less frequently is permitted and encouraged, when it comes to controlled substances (written prescriptions).  6. Inform other Doctors: Always inform, all of your healthcare providers, of all the medications you take. 7. Pain Medication from other Providers: You are not allowed to accept any additional pain medication from any other Doctor or Healthcare provider. There are two exceptions to this rule. (see below) In the event that you require additional pain medication, you are responsible for notifying us, as stated below. 8. Medication Agreement: You are responsible for carefully reading and following our Medication Agreement. This must be signed before receiving any prescriptions from our practice. Safely store a copy of your signed Agreement. Violations to the Agreement will result in no further prescriptions. (Additional copies of our Medication Agreement are available upon request.) 9. Laws, Rules, & Regulations: All patients are expected to follow all Federal and State Laws, Statutes, Rules, & Regulations. Ignorance of the Laws does not constitute a valid excuse. The use of any illegal substances is prohibited. 10. Adopted CDC guidelines & recommendations: Target dosing levels will be at or below 60 MME/day. Use of benzodiazepines** is not recommended.  Exceptions: There are only two exceptions to the rule of not receiving pain medications from other Healthcare Providers. 1. Exception #1 (Emergencies): In the event of an emergency (i.e.: accident requiring emergency care), you are allowed to receive additional pain medication. However, you are responsible for: As soon as you are able, call our office [phone], at any time of the day or night, and leave a message stating your name, the  date and nature of the emergency, and the name and dose of the medication   prescribed. In the event that your call is answered by a member of our staff, make sure to document and save the date, time, and the name of the person that took your information.  2. Exception #2 (Planned Surgery): In the event that you are scheduled by another doctor or dentist to have any type of surgery or procedure, you are allowed (for a period no longer than 30 days), to receive additional pain medication, for the acute post-op pain. However, in this case, you are responsible for picking up a copy of our "Post-op Pain Management for Surgeons" handout, and giving it to your surgeon or dentist. This document is available at our office, and does not require an appointment to obtain it. Simply go to our office during business hours (Monday-Thursday from 8:00 AM to 4:00 PM) (Friday 8:00 AM to 12:00 Noon) or if you have a scheduled appointment with us, prior to your surgery, and ask for it by name. In addition, you will need to provide us with your name, name of your surgeon, type of surgery, and date of procedure or surgery.  *Opioid medications include: morphine, codeine, oxycodone, oxymorphone, hydrocodone, hydromorphone, meperidine, tramadol, tapentadol, buprenorphine, fentanyl, methadone. **Benzodiazepine medications include: diazepam (Valium), alprazolam (Xanax), clonazepam (Klonopine), lorazepam (Ativan), clorazepate (Tranxene), chlordiazepoxide (Librium), estazolam (Prosom), oxazepam (Serax), temazepam (Restoril), triazolam (Halcion) (Last updated: 12/01/2017) ____________________________________________________________________________________________   ____________________________________________________________________________________________  Pain Scale  Introduction: The pain score used by this practice is the Verbal Numerical Rating Scale (VNRS-11). This is an 11-point scale. It is for adults and children 10 years or  older. There are significant differences in how the pain score is reported, used, and applied. Forget everything you learned in the past and learn this scoring system.  General Information: The scale should reflect your current level of pain. Unless you are specifically asked for the level of your worst pain, or your average pain. If you are asked for one of these two, then it should be understood that it is over the past 24 hours.  Basic Activities of Daily Living (ADL): Personal hygiene, dressing, eating, transferring, and using restroom.  Instructions: Most patients tend to report their level of pain as a combination of two factors, their physical pain and their psychosocial pain. This last one is also known as "suffering" and it is reflection of how physical pain affects you socially and psychologically. From now on, report them separately. From this point on, when asked to report your pain level, report only your physical pain. Use the following table for reference.  Pain Clinic Pain Levels (0-5/10)  Pain Level Score  Description  No Pain 0   Mild pain 1 Nagging, annoying, but does not interfere with basic activities of daily living (ADL). Patients are able to eat, bathe, get dressed, toileting (being able to get on and off the toilet and perform personal hygiene functions), transfer (move in and out of bed or a chair without assistance), and maintain continence (able to control bladder and bowel functions). Blood pressure and heart rate are unaffected. A normal heart rate for a healthy adult ranges from 60 to 100 bpm (beats per minute).   Mild to moderate pain 2 Noticeable and distracting. Impossible to hide from other people. More frequent flare-ups. Still possible to adapt and function close to normal. It can be very annoying and may have occasional stronger flare-ups. With discipline, patients may get used to it and adapt.   Moderate pain 3 Interferes significantly with activities of daily  living (ADL).   It becomes difficult to feed, bathe, get dressed, get on and off the toilet or to perform personal hygiene functions. Difficult to get in and out of bed or a chair without assistance. Very distracting. With effort, it can be ignored when deeply involved in activities.   Moderately severe pain 4 Impossible to ignore for more than a few minutes. With effort, patients may still be able to manage work or participate in some social activities. Very difficult to concentrate. Signs of autonomic nervous system discharge are evident: dilated pupils (mydriasis); mild sweating (diaphoresis); sleep interference. Heart rate becomes elevated (>115 bpm). Diastolic blood pressure (lower number) rises above 100 mmHg. Patients find relief in laying down and not moving.   Severe pain 5 Intense and extremely unpleasant. Associated with frowning face and frequent crying. Pain overwhelms the senses.  Ability to do any activity or maintain social relationships becomes significantly limited. Conversation becomes difficult. Pacing back and forth is common, as getting into a comfortable position is nearly impossible. Pain wakes you up from deep sleep. Physical signs will be obvious: pupillary dilation; increased sweating; goosebumps; brisk reflexes; cold, clammy hands and feet; nausea, vomiting or dry heaves; loss of appetite; significant sleep disturbance with inability to fall asleep or to remain asleep. When persistent, significant weight loss is observed due to the complete loss of appetite and sleep deprivation.  Blood pressure and heart rate becomes significantly elevated. Caution: If elevated blood pressure triggers a pounding headache associated with blurred vision, then the patient should immediately seek attention at an urgent or emergency care unit, as these may be signs of an impending stroke.    Emergency Department Pain Levels (6-10/10)  Emergency Room Pain 6 Severely limiting. Requires emergency care  and should not be seen or managed at an outpatient pain management facility. Communication becomes difficult and requires great effort. Assistance to reach the emergency department may be required. Facial flushing and profuse sweating along with potentially dangerous increases in heart rate and blood pressure will be evident.   Distressing pain 7 Self-care is very difficult. Assistance is required to transport, or use restroom. Assistance to reach the emergency department will be required. Tasks requiring coordination, such as bathing and getting dressed become very difficult.   Disabling pain 8 Self-care is no longer possible. At this level, pain is disabling. The individual is unable to do even the most "basic" activities such as walking, eating, bathing, dressing, transferring to a bed, or toileting. Fine motor skills are lost. It is difficult to think clearly.   Incapacitating pain 9 Pain becomes incapacitating. Thought processing is no longer possible. Difficult to remember your own name. Control of movement and coordination are lost.   The worst pain imaginable 10 At this level, most patients pass out from pain. When this level is reached, collapse of the autonomic nervous system occurs, leading to a sudden drop in blood pressure and heart rate. This in turn results in a temporary and dramatic drop in blood flow to the brain, leading to a loss of consciousness. Fainting is one of the body's self defense mechanisms. Passing out puts the brain in a calmed state and causes it to shut down for a while, in order to begin the healing process.    Summary: 1. Refer to this scale when providing us with your pain level. 2. Be accurate and careful when reporting your pain level. This will help with your care. 3. Over-reporting your pain level will lead to loss of credibility. 4. Even   a level of 1/10 means that there is pain and will be treated at our facility. 5. High, inaccurate reporting will be  documented as "Symptom Exaggeration", leading to loss of credibility and suspicions of possible secondary gains such as obtaining more narcotics, or wanting to appear disabled, for fraudulent reasons. 6. Only pain levels of 5 or below will be seen at our facility. 7. Pain levels of 6 and above will be sent to the Emergency Department and the appointment cancelled. ____________________________________________________________________________________________   ____________________________________________________________________________________________  Preparing for your procedure (without sedation)  Instructions: . Oral Intake: Do not eat or drink anything for at least 3 hours prior to your procedure. . Transportation: Unless otherwise stated by your physician, you may drive yourself after the procedure. . Blood Pressure Medicine: Take your blood pressure medicine with a sip of water the morning of the procedure. . Blood thinners:  . Diabetics on insulin: Notify the staff so that you can be scheduled 1st case in the morning. If your diabetes requires high dose insulin, take only  of your normal insulin dose the morning of the procedure and notify the staff that you have done so. . Preventing infections: Shower with an antibacterial soap the morning of your procedure.  . Build-up your immune system: Take 1000 mg of Vitamin C with every meal (3 times a day) the day prior to your procedure. [facility] Antibiotics: Inform the staff if you have a condition or reason that requires you to take antibiotics before dental procedures. . Pregnancy: If you are pregnant, call and cancel the procedure. . Sickness: If you have a cold, fever, or any active infections, call and cancel the procedure. . Arrival: You must be in the facility at least 30 minutes prior to your scheduled procedure. . Children: Do not bring any children with you. . Dress appropriately: Bring dark clothing that you would not mind if they get  stained. . Valuables: Do not bring any jewelry or valuables.  Procedure appointments are reserved for interventional treatments only. [facility] No Prescription Refills. . No medication changes will be discussed during procedure appointments. . No disability issues will be discussed.  Remember:  Regular Business hours are:  Monday to Thursday 8:00 AM to 4:00 PM  Provider's Schedule: Delano Metz, MD:  Procedure days: Tuesday and Thursday 7:30 AM to 4:00 PM  Edward Jolly, MD:  Procedure days: Monday and Wednesday 7:30 AM to 4:00 PM ____________________________________________________________________________________________  ____________________________________________________________________________________________  Preparing for Procedure with Sedation  Instructions: . Oral Intake: Do not eat or drink anything for at least 8 hours prior to your procedure. . Transportation: Public transportation is not allowed. Bring an adult driver. The driver must be physically present in our waiting room before any procedure can be started. [facility] Physical Assistance: Bring an adult physically capable of assisting you, in the event you need help. This adult should keep you company at home for at least 6 hours after the procedure. . Blood Pressure Medicine: Take your blood pressure medicine with a sip of water the morning of the procedure. . Blood thinners:  . Diabetics on insulin: Notify the staff so that you can be scheduled 1st case in the morning. If your diabetes requires high dose insulin, take only  of your normal insulin dose the morning of the procedure and notify the staff that you have done so. . Preventing infections: Shower with an antibacterial soap the morning of your procedure. . Build-up your immune system: Take 1000 mg of Vitamin C with every  meal (3 times a day) the day prior to your procedure. [facility] Antibiotics: Inform the staff if you have a condition or reason that requires you to take  antibiotics before dental procedures. . Pregnancy: If you are pregnant, call and cancel the procedure. . Sickness: If you have a cold, fever, or any active infections, call and cancel the procedure. . Arrival: You must be in the facility at least 30 minutes prior to your scheduled procedure. . Children: Do not bring children with you. . Dress appropriately: Bring dark clothing that you would not mind if they get stained. . Valuables: Do not bring any jewelry or valuables.  Procedure appointments are reserved for interventional treatments only. [place] No Prescription Refills. . No medication changes will be discussed during procedure appointments. . No disability issues will be discussed.  Remember:  Regular Business hours are:  Monday to Thursday 8:00 AM to 4:00 PM  Provider's Schedule: Delano Metz, MD:  Procedure days: Tuesday and Thursday 7:30 AM to 4:00 PM  Edward Jolly, MD:  Procedure days: Monday and Wednesday 7:30 AM to 4:00 PM ____________________________________________________________________________________________   Epidural Steroid Injection An epidural steroid injection is a shot of steroid medicine and numbing medicine that is given into the space between the spinal cord and the bones in your back (epidural space). The shot helps relieve pain caused by an irritated or swollen nerve root. The amount of pain relief you get from the injection depends on what is causing the nerve to be swollen and irritated, and how long your pain lasts. You are more likely to benefit from this injection if your pain is strong and comes on suddenly rather than if you have had pain for a long time. Tell a health care provider about:  Any allergies you have.  All medicines you are taking, including vitamins, herbs, eye drops, creams, and over-the-counter medicines.  Any problems you or family members have had with anesthetic medicines.  Any blood disorders you have.  Any surgeries you  have had.  Any medical conditions you have.  Whether you are pregnant or may be pregnant. What are the risks? Generally, this is a safe procedure. However, problems may occur, including:  Headache.  Bleeding.  Infection.  Allergic reaction to medicines.  Damage to your nerves.  What happens before the procedure? Staying hydrated Follow instructions from your health care provider about hydration, which may include:  Up to 2 hours before the procedure - you may continue to drink clear liquids, such as water, clear fruit juice, black coffee, and plain tea.  Eating and drinking restrictions Follow instructions from your health care provider about eating and drinking, which may include:  8 hours before the procedure - stop eating heavy meals or foods such as meat, fried foods, or fatty foods.  6 hours before the procedure - stop eating light meals or foods, such as toast or cereal.  6 hours before the procedure - stop drinking milk or drinks that contain milk.  2 hours before the procedure - stop drinking clear liquids.  Medicine  You may be given medicines to lower anxiety.  Ask your health care provider about: ? Changing or stopping your regular medicines. This is especially important if you are taking diabetes medicines or blood thinners. ? Taking medicines such as aspirin and ibuprofen. These medicines can thin your blood. Do not take these medicines before your procedure if your health care provider instructs you not to. General instructions  Plan to have someone take  you home from the hospital or clinic. What happens during the procedure?  You may receive a medicine to help you relax (sedative).  You will be asked to lie on your abdomen.  The injection site will be cleaned.  A numbing medicine (local anesthetic) will be used to numb the injection site.  A needle will be inserted through your skin into the epidural space. You may feel some discomfort when this  happens. An X-ray machine will be used to make sure the needle is put as close as possible to the affected nerve.  A steroid medicine and a local anesthetic will be injected into the epidural space.  The needle will be removed.  A bandage (dressing) will be put over the injection site. What happens after the procedure?  Your blood pressure, heart rate, breathing rate, and blood oxygen level will be monitored until the medicines you were given have worn off.  Your arm or leg may feel weak or numb for a few hours.  The injection site may feel sore.  Do not drive for 24 hours if you received a sedative. This information is not intended to replace advice given to you by your health care provider. Make sure you discuss any questions you have with your health care provider. Document Released: 12/28/2007 Document Revised: 03/03/2016 Document Reviewed: 01/06/2016 [employer] Interactive Patient Education  [employer]2018 [employer]

## 2018-03-14 NOTE — Progress Notes (Signed)
Patient's Name: Caleb Edwards  MRN: [id number]  Referring Provider: Inc, [place]: Sep 11, 1951  PCP: [facility], [place]: 03/14/2018  Note by: Vevelyn Francois NP  Service setting: Ambulatory outpatient  Specialty: Interventional Pain Management  Location: [facility] (AMB) Pain Management Facility    Patient type: Established    Primary Reason(s) for Visit: Encounter for prescription drug management. (Level of risk: moderate)  CC: Back Pain (lower)  HPI  Caleb Edwards is a 67 y.o. year old, male patient, who comes today for a medication management evaluation. 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 His primarily concern today is the Back Pain (lower)  Pain Assessment: Location: Lower, Right Back Radiating: buttocks down back of leg to calve Onset: More than a month ago Duration: Chronic pain Quality: Aching, Constant, Throbbing Severity: 7 /10 (subjective, self-reported pain score)  Note: Reported level is compatible with observation. Clinically the patient looks like a 2/10 A 2/10 is viewed as "Mild to Moderate" and described  as noticeable and distracting. Impossible to hide from other people. More frequent flare-ups. Still possible to adapt and function close to normal. It can be very annoying and may have occasional stronger flare-ups. With discipline, patients may get used to it and adapt. Information on the proper use of the pain scale provided to the patient today. When using our objective Pain Scale, levels between 6 and 10/10 are said to belong in an emergency room, as it progressively worsens from a 6/10, described as severely limiting, requiring emergency care not usually available at an outpatient pain management facility. At a 6/10 level, communication becomes difficult and requires great effort. Assistance to reach the emergency department may be required. Facial flushing and profuse sweating along with potentially dangerous increases in heart rate and blood pressure will be evident. Modifying factors: medication, rest BP: (!) 165/74  HR: 60  Caleb Edwards was last scheduled for an appointment on 09/20/2017 for medication management. During today's appointment we reviewed Caleb Edwards chronic pain status, as well as his outpatient medication regimen. He is doing well overall. He continues to work full-time. He admits that the interventional therapy, LESI gave him good relief and he would like to have this repeated in the near future.   The patient  reports that he does not use drugs. His body mass index is 24.41 kg/m.  Further details on both, my assessment(s), as well as the proposed treatment plan, please see below.  Controlled Substance Pharmacotherapy Assessment REMS (Risk Evaluation and Mitigation Strategy)  Analgesic:Tramadol 50 mg 2 tablets every 6 hours (400 mg/day) MME/day:40 mg/day   Caleb Specking, RN  03/14/2018  9:12 AM  Sign at close encounter Nursing Pain Medication Assessment:  Safety precautions to be maintained throughout the outpatient stay will include: orient to  surroundings, keep bed  in low position, maintain call bell within reach at all times, provide assistance with transfer out of bed and ambulation.  Medication Inspection Compliance: Caleb Edwards did not comply with our request to bring his pills to be counted. He was reminded that bringing the medication bottles, even when empty, is a requirement.  Medication: None brought in. Pill/Patch Count: None available to be counted. Bottle Appearance: No container available. Did not bring bottle(s) to appointment. Filled Date: N/A Last Medication intake:  Today   Pharmacokinetics: Liberation and absorption (onset of action): WNL Distribution (time to peak effect): WNL Metabolism and excretion (duration of action): WNL         Pharmacodynamics: Desired effects: Analgesia: Caleb Edwards reports >50% benefit. Functional ability: Patient reports that medication allows him to accomplish basic ADLs Clinically meaningful improvement in function (CMIF): Sustained CMIF goals met Perceived effectiveness: Described as relatively effective, allowing for increase in activities of daily living (ADL) Undesirable effects: Side-effects or Adverse reactions: None reported Monitoring: [place] PMP: Online review of the past 71-monthperiod conducted. Compliant with practice rules and regulations Last UDS on record: Summary  Date Value Ref Range Status  03/24/2017 FINAL  Final    Comment:    ==================================================================== TOXASSURE SELECT 13 (MW) ==================================================================== Test                             Result       Flag       Units Drug Present   Tramadol                       PRESENT   O-Desmethyltramadol            PRESENT   N-Desmethyltramadol            PRESENT    Source of tramadol is a prescription medication.    O-desmethyltramadol and N-desmethyltramadol are expected    metabolites of  tramadol. ==================================================================== Test                      Result    Flag   Units      Ref Range   Creatinine              125              mg/dL      >=20 ==================================================================== Declared Medications:  Medication list was not provided. ==================================================================== For clinical consultation, please call ([phone] ====================================================================    UDS interpretation: Compliant          Medication Assessment Form: Reviewed. Patient indicates being compliant with therapy Treatment compliance: Compliant Risk Assessment Profile: Aberrant behavior: See prior evaluations. None observed or detected today Comorbid factors increasing risk of overdose: See prior notes. No additional risks detected today Risk of substance use disorder (SUD): Low Opioid Risk Tool - 03/14/18 0840      Personal History of Substance Abuse   Alcohol  Negative    Illegal Drugs  Negative    Rx Drugs  Negative      Age   Age between 176-45years   No      Psychological Disease   Psychological Disease  Negative    Depression  Negative      Total Score   Opioid Risk Tool Scoring  0    Opioid Risk Interpretation  Low Risk      ORT  Scoring interpretation table:  Score <3 = Low Risk for SUD  Score between 4-7 = Moderate Risk for SUD  Score >8 = High Risk for Opioid Abuse   Risk Mitigation Strategies:  Patient Counseling: Covered Patient-Prescriber Agreement (PPA): Present and active  Notification to other healthcare providers: Done  Pharmacologic Plan: No change in therapy, at this time.             Laboratory Chemistry  Inflammation Markers (CRP: Acute Phase) (ESR: Chronic Phase) Lab Results  Component Value Date   CRP 0.6 12/26/2015   ESRSEDRATE 3 12/26/2015                         Rheumatology Markers No results found for:  RF, ANA, LABURIC, URICUR, LYMEIGGIGMAB, LYMEABIGMQN, HLAB27                      Renal Function Markers Lab Results  Component Value Date   BUN 12 02/06/2018   CREATININE 1.18 02/06/2018   GFRAA >60 02/06/2018   GFRNONAA >60 02/06/2018                             Hepatic Function Markers Lab Results  Component Value Date   AST 21 02/03/2018   ALT 16 (L) 02/03/2018   ALBUMIN 3.8 02/03/2018   ALKPHOS 65 02/03/2018   LIPASE 24 01/23/2018                        Electrolytes Lab Results  Component Value Date   NA 137 02/06/2018   K 3.6 02/06/2018   CL 106 02/06/2018   CALCIUM 7.9 (L) 02/06/2018   MG 1.9 01/04/2016                        Neuropathy Markers Lab Results  Component Value Date   VITAMINB12 613 12/26/2015   HIV Non Reactive 08/01/2017                        Bone Pathology Markers Lab Results  Component Value Date   VD25OH 24.0 (L) 12/26/2015                         Coagulation Parameters Lab Results  Component Value Date   INR 1.24 01/03/2016   LABPROT 15.8 (H) 01/03/2016   PLT 288 02/13/2018                        Cardiovascular Markers Lab Results  Component Value Date   TROPONINI <0.03 01/02/2016   HGB 11.3 (L) 02/13/2018   HCT 33.5 (L) 02/13/2018                         CA Markers No results found for: CEA, CA125, LABCA2                      Note: Lab results reviewed.  Recent Diagnostic Imaging Results  CT ABDOMEN PELVIS W CONTRAST CLINICAL DATA:  Abdominal pain and rectal bleeding for 2 days. Diarrhea. Diverticulosis. Nephrolithiasis.  EXAM: CT ABDOMEN AND PELVIS WITH CONTRAST  TECHNIQUE: Multidetector CT imaging of the abdomen and pelvis was performed using the standard protocol following bolus administration of intravenous contrast.  CONTRAST:  162m  ISOVUE-300 IOPAMIDOL (ISOVUE-300) INJECTION 61%  COMPARISON:  06/07/2017  FINDINGS: Lower Chest: No acute findings.  Hepatobiliary: No hepatic masses identified.  Gallbladder is unremarkable.  Pancreas:  No mass or inflammatory changes.  Spleen: Within normal limits in size and appearance.  Adrenals/Urinary Tract: No masses identified. No evidence of hydronephrosis. Small bilateral nonobstructing renal calculi again seen. Tiny bilateral renal cysts are unchanged. Unremarkable unopacified urinary bladder.  Stomach/Bowel: No evidence of obstruction, inflammatory process or abnormal fluid collections. Normal appendix visualized. Diffuse colonic diverticulosis is again seen, however there is no evidence of diverticulitis.  Vascular/Lymphatic: No pathologically enlarged lymph nodes. No abdominal aortic aneurysm. Aortic atherosclerosis.  Reproductive:  Stable mildly enlarged prostate.  Other:  None.  Musculoskeletal: No suspicious bone lesions identified. Stable lumbar spondylosis with grade 1 anterolisthesis at L5-S1.  IMPRESSION: Colonic diverticulosis, without radiographic evidence of diverticulitis or other acute findings.  Bilateral nonobstructing nephrolithiasis.  Stable mildly enlarged prostate.  Electronically Signed   By: Earle Gell M.D.   On: 01/23/2018 12:08  Complexity Note: Imaging results reviewed. Results shared with Mr. Rothe, using Layman's terms.                         Meds   Current Outpatient Medications:  .  amLODipine (NORVASC) 10 MG tablet, Take 1 tablet by mouth daily., Disp: , Rfl: 3 .  atorvastatin (LIPITOR) 20 MG tablet, Take 1 tablet by mouth at bedtime., Disp: , Rfl: 3 .  chlorthalidone (HYGROTON) 50 MG tablet, Take 1 tablet by mouth daily., Disp: , Rfl: 3 .  ferrous sulfate 325 (65 FE) MG tablet, Take 325 mg by mouth daily with breakfast., Disp: , Rfl:  .  lisinopril (PRINIVIL,ZESTRIL) 40 MG tablet, Take 1 tablet by mouth daily., Disp: , Rfl: 3 .  pantoprazole (PROTONIX) 40 MG tablet, Take 1 tablet (40 mg total) by mouth 2 (two) times daily before a meal., Disp: 60 tablet, Rfl: 0 .  polyethylene  glycol (MIRALAX / GLYCOLAX) packet, Take 17 g by mouth daily., Disp: 30 each, Rfl: 0 .  sildenafil (VIAGRA) 100 MG tablet, Take 100 mg by mouth daily as needed for erectile dysfunction., Disp: , Rfl:  .  traMADol (ULTRAM) 50 MG tablet, Take 2 tablets (100 mg total) by mouth every 6 (six) hours as needed for severe pain., Disp: 240 tablet, Rfl: 5  ROS  Constitutional: Denies any fever or chills Gastrointestinal: No reported hemesis, hematochezia, vomiting, or acute GI distress Musculoskeletal: Denies any acute onset joint swelling, redness, loss of ROM, or weakness Neurological: No reported episodes of acute onset apraxia, aphasia, dysarthria, agnosia, amnesia, paralysis, loss of coordination, or loss of consciousness  Allergies  Mr. Guillet has No Known Allergies.  PFSH  Drug: Mr. Millon  reports that he does not use drugs. Alcohol:  reports that he does not drink alcohol. Tobacco:  reports that he has quit smoking. He has never used smokeless tobacco. Medical:  has a past medical history of Back pain, Blood transfusion without reported diagnosis (12/2015), Diverticulitis, Hypertension, and Ulcer, stomach peptic, chronic. Surgical: Mr. Kostelnik  has a past surgical history that includes Esophagogastroduodenoscopy (egd) with propofol (N/A, 01/03/2016); Colonoscopy with propofol (N/A, 08/02/2017); Esophagogastroduodenoscopy (egd) with propofol (08/02/2017); Esophagogastroduodenoscopy (egd) with propofol (N/A, 02/05/2018); and Colonoscopy with propofol (N/A, 02/05/2018). Family: family history includes Diabetes in his mother; Heart disease in his father; Prostate cancer in his brother.  Constitutional Exam  General appearance: Well nourished, well developed, and well hydrated. In  no apparent acute distress Vitals:   03/14/18 0833  BP: (!) 165/74  Pulse: 60  Resp: 16  Temp: 98.1 F (36.7 C)  SpO2: 100%  Weight: 180 lb (81.6 kg)  Height: 6' (1.829 m)   BMI Assessment: Estimated body mass index is  24.41 kg/m as calculated from the following:   Height as of this encounter: 6' (1.829 m).   Weight as of this encounter: 180 lb (81.6 kg). Psych/Mental status: Alert, oriented x 3 (person, place, & time)       Eyes: PERLA Respiratory: No evidence of acute respiratory distress  Lumbar Spine Area Exam  Skin & Axial Inspection: No masses, redness, or swelling Alignment: Symmetrical Functional ROM: Unrestricted ROM       Stability: No instability detected Muscle Tone/Strength: Functionally intact. No obvious neuro-muscular anomalies detected. Sensory (Neurological): Unimpaired Palpation: No palpable anomalies       Provocative Tests: Lumbar Hyperextension/rotation test: deferred today       Lumbar quadrant test (Kemp's test): deferred today       Lumbar Lateral bending test: deferred today       Patrick's Maneuver: deferred today                   FABER test: deferred today       Thigh-thrust test: deferred today       S-I compression test: deferred today       S-I distraction test: deferred today        Gait & Posture Assessment  Ambulation: Unassisted Gait: Relatively normal for age and body habitus Posture: WNL   Lower Extremity Exam    Side: Right lower extremity  Side: Left lower extremity  Stability: No instability observed          Stability: No instability observed          Skin & Extremity Inspection: Skin color, temperature, and hair growth are WNL. No peripheral edema or cyanosis. No masses, redness, swelling, asymmetry, or associated skin lesions. No contractures.  Skin & Extremity Inspection: Skin color, temperature, and hair growth are WNL. No peripheral edema or cyanosis. No masses, redness, swelling, asymmetry, or associated skin lesions. No contractures.  Functional ROM: Unrestricted ROM                  Functional ROM: Unrestricted ROM                  Muscle Tone/Strength: Functionally intact. No obvious neuro-muscular anomalies detected.  Muscle Tone/Strength:  Functionally intact. No obvious neuro-muscular anomalies detected.  Sensory (Neurological): Unimpaired  Sensory (Neurological): Unimpaired  Palpation: No palpable anomalies  Palpation: No palpable anomalies   Assessment  Primary Diagnosis & Pertinent Problem List: The primary encounter diagnosis was Lumbar spondylosis. Diagnoses of Lumbar facet syndrome (Location of Primary Source of Pain) (Bilateral) (R>L), Osteoarthritis of cervical spine, unspecified spinal osteoarthritis complication status, Chronic pain syndrome, and Long term prescription opiate use were also pertinent to this visit.  Status Diagnosis  Persistent Controlled Controlled 1. Lumbar spondylosis   2. Lumbar facet syndrome (Location of Primary Source of Pain) (Bilateral) (R>L)   3. Osteoarthritis of cervical spine, unspecified spinal osteoarthritis complication status   4. Chronic pain syndrome   5. Long term prescription opiate use     Problems updated and reviewed during this visit: No problems updated. Plan of Care  Pharmacotherapy (Medications Ordered): Meds ordered this encounter  Medications  . traMADol (ULTRAM) 50 MG tablet    Sig: Take  2 tablets (100 mg total) by mouth every 6 (six) hours as needed for severe pain.    Dispense:  240 tablet    Refill:  5    Do not add this medication to the electronic "Automatic Refill" notification system. Patient may have prescription filled one day early if pharmacy is closed on scheduled refill date. Do not fill until: 03/14/2018 To last until:09/10/2018    Order Specific Question:   Supervising Provider    Answer:   Milinda Pointer [phone]   New Prescriptions   No medications on file   Medications administered today: Jackelyn Knife had no medications administered during this visit. Lab-work, procedure(s), and/or referral(s): Orders Placed This Encounter  Procedures  . Lumbar Epidural Injection  . ToxASSURE Select 13 (MW), Urine   Imaging and/or  referral(s): None  Interventional therapies: Planned, scheduled, and/or pending: Not at this time.   Considering: Diagnosticbilateral lumbar facet block Possiblebilateral lumbar facet radiofrequency ablation. Diagnostic bilateral L4-5 transforaminal epidural steroid injection   Palliative PRN treatment(s): Diagnostic bilateral lumbar facet block Diagnostic bilateral L4-5 transforaminal epidural steroid injection      Provider-requested follow-up: Return in about 6 months (around 09/13/2018) for MedMgmt with Me Dionisio David), in addition, Procedure(w/Sedation), w/ Dr. Dossie Arbour, ([facility]).  Future Appointments  Date Time Provider [place]  05/16/2018  1:45 PM Jonathon Bellows, MD [facility] None  06/15/2018  2:45 PM [facility] None   Primary Care Physician: [facility], [place] Location: [facility] Outpatient Pain Management Facility Note by: Vevelyn Francois NP Date: 03/14/2018; Time: 9:14 AM  Pain Score Disclaimer: We use the NRS-11 scale. This is a self-reported, subjective measurement of pain severity with only modest accuracy. It is used primarily to identify changes within a particular patient. It must be understood that outpatient pain scales are significantly less accurate that those used for research, where they can be applied under ideal controlled circumstances with minimal exposure to variables. In reality, the score is likely to be a combination of pain intensity and pain affect, where pain affect describes the degree of emotional arousal or changes in action readiness caused by the sensory experience of pain. Factors such as social and work situation, setting, emotional state, anxiety levels, expectation, and prior pain experience may influence pain perception and show large inter-individual differences that may also be affected by time variables.  Patient instructions provided during this appointment: Patient Instructions    ____________________________________________________________________________________________  Medication Rules  Applies to: All patients receiving prescriptions (written or electronic).  Pharmacy of record: Pharmacy where electronic prescriptions will be sent. If written prescriptions are taken to a different pharmacy, please inform the nursing staff. The pharmacy listed in the electronic medical record should be the one where you would like electronic prescriptions to be sent.  Prescription refills: Only during scheduled appointments. Applies to both, written and electronic prescriptions.  NOTE: The following applies primarily to controlled substances (Opioid* Pain Medications).   Patient's responsibilities: 1. Pain Pills: Bring all pain pills to every appointment (except for procedure appointments). 2. Pill Bottles: Bring pills in original pharmacy bottle. Always bring newest bottle. Bring bottle, even if empty. 3. Medication refills: You are responsible for knowing and keeping track of what medications you need refilled. The day before your appointment, write a list of all prescriptions that need to be refilled. Bring that list to your appointment and give it to the admitting nurse. Prescriptions will be written only during appointments. If you forget a medication, it will not  be "Called in", "Faxed", or "electronically sent". You will need to get another appointment to get these prescribed. 4. Prescription Accuracy: You are responsible for carefully inspecting your prescriptions before leaving our office. Have the discharge nurse carefully go over each prescription with you, before taking them home. Make sure that your name is accurately spelled, that your address is correct. Check the name and dose of your medication to make sure it is accurate. Check the number of pills, and the written instructions to make sure they are clear and accurate. Make sure that you are given enough medication to  last until your next medication refill appointment. 5. Taking Medication: Take medication as prescribed. Never take more pills than instructed. Never take medication more frequently than prescribed. Taking less pills or less frequently is permitted and encouraged, when it comes to controlled substances (written prescriptions).  6. Inform other Doctors: Always inform, all of your healthcare providers, of all the medications you take. 7. Pain Medication from other Providers: You are not allowed to accept any additional pain medication from any other Doctor or Healthcare provider. There are two exceptions to this rule. (see below) In the event that you require additional pain medication, you are responsible for notifying us, as stated below. 8. Medication Agreement: You are responsible for carefully reading and following our Medication Agreement. This must be signed before receiving any prescriptions from our practice. Safely store a copy of your signed Agreement. Violations to the Agreement will result in no further prescriptions. (Additional copies of our Medication Agreement are available upon request.) 9. Laws, Rules, & Regulations: All patients are expected to follow all Federal and [employer], [employer], Rules, [employer]. Ignorance of the Laws does not constitute a valid excuse. The use of any illegal substances is prohibited. 10. Adopted CDC guidelines & recommendations: Target dosing levels will be at or below 60 MME/day. Use of benzodiazepines** is not recommended.  Exceptions: There are only two exceptions to the rule of not receiving pain medications from other Healthcare Providers. 1. Exception #1 (Emergencies): In the event of an emergency (i.e.: accident requiring emergency care), you are allowed to receive additional pain medication. However, you are responsible for: As soon as you are able, call our office [phone], at any time of the day or night, and leave a message stating your  name, the date and nature of the emergency, and the name and dose of the medication prescribed. In the event that your call is answered by a member of our staff, make sure to document and save the date, time, and the name of the person that took your information.  2. Exception #2 (Planned Surgery): In the event that you are scheduled by another doctor or dentist to have any type of surgery or procedure, you are allowed (for a period no longer than 30 days), to receive additional pain medication, for the acute post-op pain. However, in this case, you are responsible for picking up a copy of our "Post-op Pain Management for Surgeons" handout, and giving it to your surgeon or dentist. This document is available at our office, and does not require an appointment to obtain it. Simply go to our office during business hours (Monday-Thursday from 8:00 AM to 4:00 PM) (Friday 8:00 AM to 12:00 Noon) or if you have a scheduled appointment with [place], prior to your surgery, and ask for it by name. In addition, you will need to provide [place] with your name, name of your surgeon, type of surgery,  and date of procedure or surgery.  *Opioid medications include: morphine, codeine, oxycodone, oxymorphone, hydrocodone, hydromorphone, meperidine, tramadol, tapentadol, buprenorphine, fentanyl, methadone. **Benzodiazepine medications include: diazepam (Valium), alprazolam (Xanax), clonazepam (Klonopine), lorazepam (Ativan), clorazepate (Tranxene), chlordiazepoxide (Librium), estazolam (Prosom), oxazepam (Serax), temazepam (Restoril), triazolam (Halcion) (Last updated: 12/01/2017) ____________________________________________________________________________________________   ____________________________________________________________________________________________  Pain Scale  Introduction: The pain score used by this practice is the Verbal Numerical Rating Scale (VNRS-11). This is an 11-point scale. It is for adults and children  10 years or older. There are significant differences in how the pain score is reported, used, and applied. Forget everything you learned in the past and learn this scoring system.  General Information: The scale should reflect your current level of pain. Unless you are specifically asked for the level of your worst pain, or your average pain. If you are asked for one of these two, then it should be understood that it is over the past 24 hours.  Basic Activities of Daily Living (ADL): Personal hygiene, dressing, eating, transferring, and using restroom.  Instructions: Most patients tend to report their level of pain as a combination of two factors, their physical pain and their psychosocial pain. This last one is also known as "suffering" and it is reflection of how physical pain affects you socially and psychologically. From now on, report them separately. From this point on, when asked to report your pain level, report only your physical pain. Use the following table for reference.  Pain Clinic Pain Levels (0-5/10)  Pain Level Score  Description  No Pain 0   Mild pain 1 Nagging, annoying, but does not interfere with basic activities of daily living (ADL). Patients are able to eat, bathe, get dressed, toileting (being able to get on and off the toilet and perform personal hygiene functions), transfer (move in and out of bed or a chair without assistance), and maintain continence (able to control bladder and bowel functions). Blood pressure and heart rate are unaffected. A normal heart rate for a healthy adult ranges from 60 to 100 bpm (beats per minute).   Mild to moderate pain 2 Noticeable and distracting. Impossible to hide from other people. More frequent flare-ups. Still possible to adapt and function close to normal. It can be very annoying and may have occasional stronger flare-ups. With discipline, patients may get used to it and adapt.   Moderate pain 3 Interferes significantly with activities  of daily living (ADL). It becomes difficult to feed, bathe, get dressed, get on and off the toilet or to perform personal hygiene functions. Difficult to get in and out of bed or a chair without assistance. Very distracting. With effort, it can be ignored when deeply involved in activities.   Moderately severe pain 4 Impossible to ignore for more than a few minutes. With effort, patients may still be able to manage work or participate in some social activities. Very difficult to concentrate. Signs of autonomic nervous system discharge are evident: dilated pupils (mydriasis); mild sweating (diaphoresis); sleep interference. Heart rate becomes elevated (>115 bpm). Diastolic blood pressure (lower number) rises above 100 mmHg. Patients find relief in laying down and not moving.   Severe pain 5 Intense and extremely unpleasant. Associated with frowning face and frequent crying. Pain overwhelms the senses.  Ability to do any activity or maintain social relationships becomes significantly limited. Conversation becomes difficult. Pacing back and forth is common, as getting into a comfortable position is nearly impossible. Pain wakes you up from deep sleep. Physical signs will be  obvious: pupillary dilation; increased sweating; goosebumps; brisk reflexes; cold, clammy hands and feet; nausea, vomiting or dry heaves; loss of appetite; significant sleep disturbance with inability to fall asleep or to remain asleep. When persistent, significant weight loss is observed due to the complete loss of appetite and sleep deprivation.  Blood pressure and heart rate becomes significantly elevated. Caution: If elevated blood pressure triggers a pounding headache associated with blurred vision, then the patient should immediately seek attention at an urgent or emergency care unit, as these may be signs of an impending stroke.    Emergency Department Pain Levels (6-10/10)  Emergency Room Pain 6 Severely limiting. Requires  emergency care and should not be seen or managed at an outpatient pain management facility. Communication becomes difficult and requires great effort. Assistance to reach the emergency department may be required. Facial flushing and profuse sweating along with potentially dangerous increases in heart rate and blood pressure will be evident.   Distressing pain 7 Self-care is very difficult. Assistance is required to transport, or use restroom. Assistance to reach the emergency department will be required. Tasks requiring coordination, such as bathing and getting dressed become very difficult.   Disabling pain 8 Self-care is no longer possible. At this level, pain is disabling. The individual is unable to do even the most "basic" activities such as walking, eating, bathing, dressing, transferring to a bed, or toileting. Fine motor skills are lost. It is difficult to think clearly.   Incapacitating pain 9 Pain becomes incapacitating. Thought processing is no longer possible. Difficult to remember your own name. Control of movement and coordination are lost.   The worst pain imaginable 10 At this level, most patients pass out from pain. When this level is reached, collapse of the autonomic nervous system occurs, leading to a sudden drop in blood pressure and heart rate. This in turn results in a temporary and dramatic drop in blood flow to the brain, leading to a loss of consciousness. Fainting is one of the body's self defense mechanisms. Passing out puts the brain in a calmed state and causes it to shut down for a while, in order to begin the healing process.    Summary: 1. Refer to this scale when providing [place] with your pain level. 2. Be accurate and careful when reporting your pain level. This will help with your care. 3. Over-reporting your pain level will lead to loss of credibility. 4. Even a level of 1/10 means that there is pain and will be treated at our facility. 5. High, inaccurate reporting  will be documented as "Symptom Exaggeration", leading to loss of credibility and suspicions of possible secondary gains such as obtaining more narcotics, or wanting to appear disabled, for fraudulent reasons. 6. Only pain levels of 5 or below will be seen at our facility. 7. Pain levels of 6 and above will be sent to the Emergency Department and the appointment cancelled. ____________________________________________________________________________________________   ____________________________________________________________________________________________  Preparing for your procedure (without sedation)  Instructions: . Oral Intake: Do not eat or drink anything for at least 3 hours prior to your procedure. . Transportation: Unless otherwise stated by your physician, you may drive yourself after the procedure. . Blood Pressure Medicine: Take your blood pressure medicine with a sip of water the morning of the procedure. . Blood thinners:  . Diabetics on insulin: Notify the staff so that you can be scheduled 1st case in the morning. If your diabetes requires high dose insulin, take only  of your normal  insulin dose the morning of the procedure and notify the staff that you have done so. . Preventing infections: Shower with an antibacterial soap the morning of your procedure.  . Build-up your immune system: Take 1000 mg of Vitamin C with every meal (3 times a day) the day prior to your procedure. Marland Kitchen Antibiotics: Inform the staff if you have a condition or reason that requires you to take antibiotics before dental procedures. . Pregnancy: If you are pregnant, call and cancel the procedure. . Sickness: If you have a cold, fever, or any active infections, call and cancel the procedure. . Arrival: You must be in the facility at least 30 minutes prior to your scheduled procedure. . Children: Do not bring any children with you. . Dress appropriately: Bring dark clothing that you would not mind if they  get stained. . Valuables: Do not bring any jewelry or valuables.  Procedure appointments are reserved for interventional treatments only. Marland Kitchen No Prescription Refills. . No medication changes will be discussed during procedure appointments. . No disability issues will be discussed.  Remember:  Regular Business hours are:  Monday to Thursday 8:00 AM to 4:00 PM  Provider's Schedule: Milinda Pointer, MD:  Procedure days: Tuesday and Thursday 7:30 AM to 4:00 PM  Gillis Santa, MD:  Procedure days: Monday and Wednesday 7:30 AM to 4:00 PM ____________________________________________________________________________________________  ____________________________________________________________________________________________  Preparing for Procedure with Sedation  Instructions: . Oral Intake: Do not eat or drink anything for at least 8 hours prior to your procedure. . Transportation: Public transportation is not allowed. Bring an adult driver. The driver must be physically present in our waiting room before any procedure can be started. Marland Kitchen Physical Assistance: Bring an adult physically capable of assisting you, in the event you need help. This adult should keep you company at home for at least 6 hours after the procedure. . Blood Pressure Medicine: Take your blood pressure medicine with a sip of water the morning of the procedure. . Blood thinners:  . Diabetics on insulin: Notify the staff so that you can be scheduled 1st case in the morning. If your diabetes requires high dose insulin, take only  of your normal insulin dose the morning of the procedure and notify the staff that you have done so. . Preventing infections: Shower with an antibacterial soap the morning of your procedure. . Build-up your immune system: Take 1000 mg of Vitamin C with every meal (3 times a day) the day prior to your procedure. Marland Kitchen Antibiotics: Inform the staff if you have a condition or reason that requires you to take  antibiotics before dental procedures. . Pregnancy: If you are pregnant, call and cancel the procedure. . Sickness: If you have a cold, fever, or any active infections, call and cancel the procedure. . Arrival: You must be in the facility at least 30 minutes prior to your scheduled procedure. . Children: Do not bring children with you. . Dress appropriately: Bring dark clothing that you would not mind if they get stained. . Valuables: Do not bring any jewelry or valuables.  Procedure appointments are reserved for interventional treatments only. Marland Kitchen No Prescription Refills. . No medication changes will be discussed during procedure appointments. . No disability issues will be discussed.  Remember:  Regular Business hours are:  Monday to Thursday 8:00 AM to 4:00 PM  Provider's Schedule: Milinda Pointer, MD:  Procedure days: Tuesday and Thursday 7:30 AM to 4:00 PM  Gillis Santa, MD:  Procedure days: Monday and Wednesday  7:30 AM to 4:00 PM ____________________________________________________________________________________________   Epidural Steroid Injection An epidural steroid injection is a shot of steroid medicine and numbing medicine that is given into the space between the spinal cord and the bones in your back (epidural space). The shot helps relieve pain caused by an irritated or swollen nerve root. The amount of pain relief you get from the injection depends on what is causing the nerve to be swollen and irritated, and how long your pain lasts. You are more likely to benefit from this injection if your pain is strong and comes on suddenly rather than if you have had pain for a long time. Tell a health care provider about:  Any allergies you have.  All medicines you are taking, including vitamins, herbs, eye drops, creams, and over-the-counter medicines.  Any problems you or family members have had with anesthetic medicines.  Any blood disorders you have.  Any surgeries you  have had.  Any medical conditions you have.  Whether you are pregnant or may be pregnant. What are the risks? Generally, this is a safe procedure. However, problems may occur, including:  Headache.  Bleeding.  Infection.  Allergic reaction to medicines.  Damage to your nerves.  What happens before the procedure? Staying hydrated Follow instructions from your health care provider about hydration, which may include:  Up to 2 hours before the procedure - you may continue to drink clear liquids, such as water, clear fruit juice, black coffee, and plain tea.  Eating and drinking restrictions Follow instructions from your health care provider about eating and drinking, which may include:  8 hours before the procedure - stop eating heavy meals or foods such as meat, fried foods, or fatty foods.  6 hours before the procedure - stop eating light meals or foods, such as toast or cereal.  6 hours before the procedure - stop drinking milk or drinks that contain milk.  2 hours before the procedure - stop drinking clear liquids.  Medicine  You may be given medicines to lower anxiety.  Ask your health care provider about: ? Changing or stopping your regular medicines. This is especially important if you are taking diabetes medicines or blood thinners. ? Taking medicines such as aspirin and ibuprofen. These medicines can thin your blood. Do not take these medicines before your procedure if your health care provider instructs you not to. General instructions  Plan to have someone take you home from the hospital or clinic. What happens during the procedure?  You may receive a medicine to help you relax (sedative).  You will be asked to lie on your abdomen.  The injection site will be cleaned.  A numbing medicine (local anesthetic) will be used to numb the injection site.  A needle will be inserted through your skin into the epidural space. You may feel some discomfort when this  happens. An X-ray machine will be used to make sure the needle is put as close as possible to the affected nerve.  A steroid medicine and a local anesthetic will be injected into the epidural space.  The needle will be removed.  A bandage (dressing) will be put over the injection site. What happens after the procedure?  Your blood pressure, heart rate, breathing rate, and blood oxygen level will be monitored until the medicines you were given have worn off.  Your arm or leg may feel weak or numb for a few hours.  The injection site may feel sore.  Do not drive  for 24 hours if you received a sedative. This information is not intended to replace advice given to you by your health care provider. Make sure you discuss any questions you have with your health care provider. Document Released: 12/28/2007 Document Revised: 03/03/2016 Document Reviewed: 01/06/2016 Elsevier Interactive Patient Education  [employer].

## 2018-03-28 ENCOUNTER — Telehealth: Payer: Self-pay

## 2018-03-28 NOTE — Telephone Encounter (Signed)
Patient has been instructed do not eat or drink anything after midnight for his EGD.  Thanks [employer]le

## 2018-03-30 ENCOUNTER — Ambulatory Visit: Payer: Medicare HMO | Admitting: Certified Registered Nurse Anesthetist

## 2018-03-30 ENCOUNTER — Ambulatory Visit
Admission: RE | Admit: 2018-03-30 | Discharge: 2018-03-30 | Disposition: A | Discharge status: Home / Self Care | Payer: Medicare HMO | Source: Ambulatory Visit | Attending: Gastroenterology | Admitting: Gastroenterology

## 2018-03-30 ENCOUNTER — Encounter
Admission: RE | Disposition: A | Discharge status: Home / Self Care | Payer: Self-pay | Source: Ambulatory Visit | Attending: Gastroenterology

## 2018-03-30 ENCOUNTER — Encounter: Payer: Self-pay | Admitting: *Deleted

## 2018-03-30 DIAGNOSIS — Z8719 Personal history of other diseases of the digestive system: Secondary | ICD-10-CM | POA: Diagnosis not present

## 2018-03-30 DIAGNOSIS — I129 Hypertensive chronic kidney disease with stage 1 through stage 4 chronic kidney disease, or unspecified chronic kidney disease: Secondary | ICD-10-CM | POA: Diagnosis not present

## 2018-03-30 DIAGNOSIS — N189 Chronic kidney disease, unspecified: Secondary | ICD-10-CM | POA: Diagnosis not present

## 2018-03-30 DIAGNOSIS — M1990 Unspecified osteoarthritis, unspecified site: Secondary | ICD-10-CM | POA: Insufficient documentation

## 2018-03-30 DIAGNOSIS — Z87891 Personal history of nicotine dependence: Secondary | ICD-10-CM | POA: Insufficient documentation

## 2018-03-30 DIAGNOSIS — Z8249 Family history of ischemic heart disease and other diseases of the circulatory system: Secondary | ICD-10-CM | POA: Diagnosis not present

## 2018-03-30 DIAGNOSIS — K219 Gastro-esophageal reflux disease without esophagitis: Secondary | ICD-10-CM | POA: Insufficient documentation

## 2018-03-30 DIAGNOSIS — K315 Obstruction of duodenum: Secondary | ICD-10-CM | POA: Diagnosis not present

## 2018-03-30 DIAGNOSIS — Z8711 Personal history of peptic ulcer disease: Secondary | ICD-10-CM | POA: Diagnosis not present

## 2018-03-30 DIAGNOSIS — Z79899 Other long term (current) drug therapy: Secondary | ICD-10-CM | POA: Diagnosis not present

## 2018-03-30 DIAGNOSIS — K625 Hemorrhage of anus and rectum: Secondary | ICD-10-CM

## 2018-03-30 HISTORY — PX: ESOPHAGOGASTRODUODENOSCOPY (EGD) WITH PROPOFOL: SHX5813

## 2018-03-30 SURGERY — ESOPHAGOGASTRODUODENOSCOPY (EGD) WITH PROPOFOL
Anesthesia: General

## 2018-03-30 MED ORDER — PROPOFOL 10 MG/ML IV BOLUS
INTRAVENOUS | Status: AC
Start: 2018-03-30 — End: 2018-03-30
  Filled 2018-03-30: qty 40

## 2018-03-30 MED ORDER — MIDAZOLAM HCL 2 MG/2ML IJ SOLN
INTRAMUSCULAR | Status: AC
  Filled 2018-03-30: qty 2

## 2018-03-30 MED ORDER — MIDAZOLAM HCL 2 MG/2ML IJ SOLN
INTRAMUSCULAR | Status: DC | PRN
  Administered 2018-03-30: 2 mg via INTRAVENOUS

## 2018-03-30 MED ORDER — SODIUM CHLORIDE 0.9 % IV SOLN
INTRAVENOUS | Status: DC
  Administered 2018-03-30: 11:00:00 via INTRAVENOUS
  Administered 2018-03-30: 1000 mL via INTRAVENOUS

## 2018-03-30 MED ORDER — KETAMINE HCL 50 MG/ML IJ SOLN
INTRAMUSCULAR | Status: DC | PRN
  Administered 2018-03-30: 20 mg via INTRAMUSCULAR

## 2018-03-30 MED ORDER — PROPOFOL 10 MG/ML IV BOLUS
INTRAVENOUS | Status: DC | PRN
  Administered 2018-03-30: 10 mg via INTRAVENOUS
  Administered 2018-03-30: 50 mg via INTRAVENOUS
  Administered 2018-03-30 (×6): 10 mg via INTRAVENOUS

## 2018-03-30 NOTE — Anesthesia Preprocedure Evaluation (Signed)
Anesthesia Evaluation  Patient identified by MRN, date of birth, ID band Patient awake    Reviewed: Allergy & Precautions, NPO status , Patient's Chart, lab work & pertinent test results, reviewed documented beta blocker date and time   History of Anesthesia Complications Negative for: history of anesthetic complications  Airway Mallampati: II  TM Distance: >3 FB     Dental  (+) Chipped, Upper Dentures, Partial Lower, Dental Advidsory Given   Pulmonary neg pulmonary ROS, former smoker,           Cardiovascular Exercise Tolerance: Good hypertension, Pt. on medications (-) angina(-) CAD, (-) Past MI, (-) Cardiac Stents and (-) CABG (-) dysrhythmias (-) Valvular Problems/Murmurs     Neuro/Psych neg Seizures  Neuromuscular disease negative psych ROS   GI/Hepatic Neg liver ROS, PUD, GERD  ,  Endo/Other  negative endocrine ROS  Renal/GU CRFRenal disease     Musculoskeletal  (+) Arthritis ,   Abdominal   Peds  Hematology  (+) anemia ,   Anesthesia Other Findings Past Medical History: No date: Back pain 12/2015: Blood transfusion without reported diagnosis     Comment:  bleeding ulcer hospitalization.  No date: Diverticulitis No date: Hypertension No date: Ulcer, stomach peptic, chronic   Reproductive/Obstetrics                             Anesthesia Physical  Anesthesia Plan  ASA: III  Anesthesia Plan: General   Post-op Pain Management:    Induction: Intravenous  PONV Risk Score and Plan: 2 and Propofol infusion  Airway Management Planned: Nasal Cannula  Additional Equipment:   Intra-op Plan:   Post-operative Plan:   Informed Consent: I have reviewed the patients History and Physical, chart, labs and discussed the procedure including the risks, benefits and alternatives for the proposed anesthesia with the patient or authorized representative who has indicated his/her  understanding and acceptance.     Plan Discussed with: CRNA  Anesthesia Plan Comments:         Anesthesia Quick Evaluation

## 2018-03-30 NOTE — H&P (Signed)
Caleb Mood, MD [address], [address], [place], [place], [address] [address], [place], [place], [address] Phone: [phone]  Fax: [phone]  Primary Care Physician:  [facility]   Pre-Procedure History & Physical: HPI:  Caleb Edwards is a 67 y.o. male is here for an endoscopy    Past Medical History:  Diagnosis Date  . Back pain   . Blood transfusion without reported diagnosis 12/2015   bleeding ulcer hospitalization.   . Diverticulitis   . Hypertension   . Ulcer, stomach peptic, chronic     Past Surgical History:  Procedure Laterality Date  . COLONOSCOPY WITH PROPOFOL N/A 08/02/2017   Procedure: COLONOSCOPY WITH PROPOFOL;  Surgeon: Midge Minium, MD;  Location: [facility] ENDOSCOPY;  Service: Endoscopy;  Laterality: N/A;  . COLONOSCOPY WITH PROPOFOL N/A 02/05/2018   Procedure: COLONOSCOPY WITH PROPOFOL;  Surgeon: Caleb Mood, MD;  Location: [facility] ENDOSCOPY;  Service: Gastroenterology;  Laterality: N/A;  . ESOPHAGOGASTRODUODENOSCOPY (EGD) WITH PROPOFOL N/A 01/03/2016   Procedure: ESOPHAGOGASTRODUODENOSCOPY (EGD) WITH PROPOFOL;  Surgeon: Scot Jun, MD;  Location: [facility] ENDOSCOPY;  Service: Endoscopy;  Laterality: N/A;  . ESOPHAGOGASTRODUODENOSCOPY (EGD) WITH PROPOFOL  08/02/2017   Procedure: ESOPHAGOGASTRODUODENOSCOPY (EGD) WITH PROPOFOL;  Surgeon: Midge Minium, MD;  Location: [facility] ENDOSCOPY;  Service: Endoscopy;;  . ESOPHAGOGASTRODUODENOSCOPY (EGD) WITH PROPOFOL N/A 02/05/2018   Procedure: ESOPHAGOGASTRODUODENOSCOPY (EGD) WITH PROPOFOL;  Surgeon: Caleb Mood, MD;  Location: [facility] ENDOSCOPY;  Service: Gastroenterology;  Laterality: N/A;    Prior to Admission medications   Medication Sig Start Date End Date Taking? Authorizing Provider  amLODipine (NORVASC) 10 MG tablet Take 1 tablet by mouth daily. 12/31/17  Yes [provider]  atorvastatin (LIPITOR) 20 MG tablet Take 1 tablet by mouth at bedtime. 12/31/17  Yes [provider]  chlorthalidone (HYGROTON) 50 MG tablet Take 1 tablet by mouth daily. 12/31/17  Yes [provider]  lisinopril (PRINIVIL,ZESTRIL) 40 MG tablet Take 1 tablet by mouth daily. 12/31/17  Yes [provider]  pantoprazole (PROTONIX) 40 MG tablet Take 1 tablet (40 mg total) by mouth 2 (two) times daily before a meal. 01/04/16  Yes Shaune Pollack, MD  polyethylene glycol [facility] Ethelene Hal) packet Take 17 g by mouth daily. 02/06/18  Yes Auburn Bilberry, MD  traMADol (ULTRAM) 50 MG tablet Take 2 tablets (100 mg total) by mouth every 6 (six) hours as needed for severe pain. 03/14/18 09/10/18 Yes Barbette Merino, NP  ferrous sulfate 325 (65 FE) MG tablet Take 325 mg by mouth daily with breakfast.    [provider]  sildenafil (VIAGRA) 100 MG tablet Take 100 mg by mouth daily as needed for erectile dysfunction.    [provider]    Allergies as of 02/13/2018  . (No Known Allergies)    Family History  Problem Relation Age of Onset  . Diabetes Mother   . Heart disease Father   . Prostate cancer Brother   . Bladder Cancer Neg Hx   . Kidney cancer Neg Hx     Social History   Socioeconomic History  . Marital status: Married    Spouse name: Not on file  . Number of children: Not on file  . Years of education: Not on file  . Highest education level: Not on file  Occupational History  . Not on file  Social Needs  . Financial resource strain: Not on file  . Food insecurity:    Worry: Not on file  Inability: Not on file  . Transportation needs:    Medical: Not on file    Non-medical: Not on file  Tobacco Use  . Smoking status: Former Games developermoker  . Smokeless tobacco: Never Used  Substance and Sexual Activity  . Alcohol use: No    Alcohol/week: 0.0 oz  . Drug use: No  . Sexual activity: Yes  Lifestyle  . Physical activity:    Days per week: Not on file    Minutes per session: Not on file  . Stress: Not on file  Relationships  . Social connections:     Talks on phone: Not on file    Gets together: Not on file    Attends religious service: Not on file    Active member of club or organization: Not on file    Attends meetings of clubs or organizations: Not on file    Relationship status: Not on file  . Intimate partner violence:    Fear of current or ex partner: Not on file    Emotionally abused: Not on file    Physically abused: Not on file    Forced sexual activity: Not on file  Other Topics Concern  . Not on file  Social History Narrative  . Not on file    Review of Systems: See HPI, otherwise negative ROS  Physical Exam: BP 129/82   Pulse 68   Temp 97.8 F (36.6 C) (Tympanic)   Resp 16   Ht 6' (1.829 m)   Wt 175 lb (79.4 kg)   SpO2 100%   BMI 23.73 kg/m  General:   Alert,  pleasant and cooperative in NAD Head:  Normocephalic and atraumatic. Neck:  Supple; no masses or thyromegaly. Lungs:  Clear throughout to auscultation, normal respiratory effort.    Heart:  +S1, +S2, Regular rate and rhythm, No edema. Abdomen:  Soft, nontender and nondistended. Normal bowel sounds, without guarding, and without rebound.   Neurologic:  Alert and  oriented x4;  grossly normal neurologically.  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  Questions have been answered.  All parties agreeable.   Caleb MoodKiran Quartez Lagos, MD  03/30/2018, 10:18 AM

## 2018-03-30 NOTE — Op Note (Signed)
[facility] Gastroenterology Patient Name: Caleb Edwards Procedure Date: 03/30/2018 9:50 AM MRN: [id number] Account #: [id number] Date of Birth: 1950/10/13 Admit Type: Outpatient Age: 67 Room: [facility] ENDO ROOM 3 Gender: Male Note Status: Finalized Procedure:            Upper GI endoscopy Indications:          Stenosis of the duodenum Providers:            Wyline Mood MD, MD Referring MD:         No Local Md, MD (Referring MD) Medicines:            Monitored Anesthesia Care Complications:        No immediate complications. Procedure:            Pre-Anesthesia Assessment:                       - Prior to the procedure, a History and Physical was                        performed, and patient medications, allergies and                        sensitivities were reviewed. The patient's tolerance of                        previous anesthesia was reviewed.                       - The risks and benefits of the procedure and the                        sedation options and risks were discussed with the                        patient. All questions were answered and informed                        consent was obtained.                       - ASA Grade Assessment: III - A patient with severe                        systemic disease.                       After obtaining informed consent, the endoscope was                        passed under direct vision. Throughout the procedure,                        the patient's blood pressure, pulse, and oxygen                        saturations were monitored continuously. The Endoscope                        was introduced through the mouth, and advanced to the  third part of duodenum. The upper GI endoscopy was                        accomplished with ease. The patient tolerated the                        procedure well. Findings:      The esophagus was normal.      The stomach was normal.      An acquired  benign-appearing, intrinsic severe stenosis was found in the       third portion of the duodenum and was traversed after dilation. A TTS       dilator was passed through the scope. Dilation with a 12 mm pyloric       balloon dilator was performed. The dilation site was examined and showed       moderate improvement in luminal narrowing.      The cardia and gastric fundus were normal on retroflexion. Impression:           - Normal esophagus.                       - Normal stomach.                       - Acquired duodenal stenosis. Dilated.                       - No specimens collected. Recommendation:       - Discharge patient to home (with escort).                       - Resume previous diet.                       - Continue present medications.                       - Use Prilosec (omeprazole) 40 mg PO daily for 6 weeks.                       - Return to my office in 4 weeks. Procedure Code(s):    --- Professional ---                       [phone], Esophagogastroduodenoscopy, flexible, transoral;                        with dilation of gastric/duodenal stricture(s) (eg,                        balloon, bougie) Diagnosis Code(s):    --- Professional ---                       K31.5, Obstruction of duodenum CPT copyright 2017 American Medical Association. All rights reserved. The codes documented in this report are preliminary and upon coder review may  be revised to meet current compliance requirements. Wyline MoodKiran Darletta Noblett, MD Wyline MoodKiran Lyndel Dancel MD, MD 03/30/2018 11:41:43 AM This report has been signed electronically. Number of Addenda: 0 Note Initiated On: 03/30/2018 9:50 AM      [facility]

## 2018-03-30 NOTE — Anesthesia Postprocedure Evaluation (Signed)
Anesthesia Post Note  Patient: Caleb Edwards  Procedure(s) Performed: ESOPHAGOGASTRODUODENOSCOPY (EGD) WITH PROPOFOL WITH DILATION (N/A )  Patient location during evaluation: Endoscopy Anesthesia Type: General Level of consciousness: awake and alert Pain management: pain level controlled Vital Signs Assessment: post-procedure vital signs reviewed and stable Respiratory status: spontaneous breathing, nonlabored ventilation, respiratory function stable and patient connected to nasal cannula oxygen Cardiovascular status: blood pressure returned to baseline and stable Postop Assessment: no apparent nausea or vomiting Anesthetic complications: no     Last Vitals:  Vitals:   03/30/18 1203 03/30/18 1213  BP: 108/77 123/80  Pulse: (!) 50 (!) 51  Resp: (!) 9 16  Temp:    SpO2: 100% 100%    Last Pain:  Vitals:   03/30/18 1203  TempSrc:   PainSc: 0-No pain                 Lenard SimmerAndrew Tekelia Kareem

## 2018-03-30 NOTE — Anesthesia Post-op Follow-up Note (Signed)
Anesthesia QCDR form completed.        

## 2018-03-30 NOTE — Transfer of Care (Signed)
Immediate Anesthesia Transfer of Care Note  Patient: Caleb Edwards  Procedure(s) Performed: ESOPHAGOGASTRODUODENOSCOPY (EGD) WITH PROPOFOL WITH DILATION (N/A )  Patient Location: PACU  Anesthesia Type:General  Level of Consciousness: sedated  Airway & Oxygen Therapy: Patient Spontanous Breathing and Patient connected to nasal cannula oxygen  Post-op Assessment: Report given to RN and Post -op Vital signs reviewed and stable  Post vital signs: Reviewed and stable  Last Vitals:  Vitals Value Taken Time  BP 94/66 03/30/2018 11:43 AM  Temp    Pulse 52 03/30/2018 11:44 AM  Resp 15 03/30/2018 11:44 AM  SpO2 100 % 03/30/2018 11:44 AM  Vitals shown include unvalidated device data.  Last Pain:  Vitals:   03/30/18 0938  TempSrc: Tympanic  PainSc: 0-No pain         Complications: No apparent anesthesia complications

## 2018-03-30 NOTE — Anesthesia Procedure Notes (Signed)
Date/Time: 03/30/2018 11:26 AM Performed by: Riccardo DubinMills, Talan Gildner W, CRNA Pre-anesthesia Checklist: Patient identified, Emergency Drugs available, Suction available, Patient being monitored and Timeout performed Patient Re-evaluated:Patient Re-evaluated prior to induction Oxygen Delivery Method: Nasal cannula Induction Type: IV induction

## 2018-03-31 ENCOUNTER — Encounter: Payer: Self-pay | Admitting: Gastroenterology

## 2018-04-11 ENCOUNTER — Other Ambulatory Visit: Payer: Self-pay | Admitting: Gastroenterology

## 2018-04-11 ENCOUNTER — Other Ambulatory Visit: Payer: Self-pay

## 2018-04-11 MED ORDER — LACTULOSE 20 G PO PACK: 20.0000 g | PACK | Freq: Three times a day (TID) | ORAL | 3 refills | Status: DC

## 2018-04-11 NOTE — Telephone Encounter (Signed)
Pt received samples of rx crystal lose he would like rx called in to [facility] [address] please

## 2018-04-11 NOTE — Telephone Encounter (Signed)
Left vm requesting pt to advise me how he was [facility] to [facility] office visit with Dr. Tobi BastosAnna.

## 2018-04-11 NOTE — Telephone Encounter (Signed)
Pt returned call and verified Kristalose mg. Pt stated he was given 20mg  to take prn. Rx called into pharmacy.

## 2018-05-16 ENCOUNTER — Ambulatory Visit: Payer: BLUE CROSS/BLUE SHIELD | Admitting: Gastroenterology

## 2018-06-13 ENCOUNTER — Encounter: Payer: Self-pay | Admitting: Pain Medicine

## 2018-06-13 ENCOUNTER — Other Ambulatory Visit: Payer: Self-pay

## 2018-06-13 ENCOUNTER — Ambulatory Visit (HOSPITAL_BASED_OUTPATIENT_CLINIC_OR_DEPARTMENT_OTHER): Payer: Medicare HMO | Admitting: Pain Medicine

## 2018-06-13 ENCOUNTER — Ambulatory Visit
Admission: RE | Admit: 2018-06-13 | Discharge: 2018-06-13 | Disposition: A | Discharge status: Home / Self Care | Payer: Medicare HMO | Source: Ambulatory Visit | Attending: Pain Medicine | Admitting: Pain Medicine

## 2018-06-13 VITALS — BP 120/80 | HR 59 | Temp 98.6°F | Resp 15 | Ht 72.0 in | Wt 178.0 lb

## 2018-06-13 DIAGNOSIS — M47816 Spondylosis without myelopathy or radiculopathy, lumbar region: Secondary | ICD-10-CM | POA: Insufficient documentation

## 2018-06-13 DIAGNOSIS — M48061 Spinal stenosis, lumbar region without neurogenic claudication: Secondary | ICD-10-CM

## 2018-06-13 DIAGNOSIS — M5137 Other intervertebral disc degeneration, lumbosacral region: Secondary | ICD-10-CM

## 2018-06-13 DIAGNOSIS — M79605 Pain in left leg: Secondary | ICD-10-CM

## 2018-06-13 DIAGNOSIS — M5117 Intervertebral disc disorders with radiculopathy, lumbosacral region: Secondary | ICD-10-CM | POA: Diagnosis not present

## 2018-06-13 DIAGNOSIS — M4310 Spondylolisthesis, site unspecified: Secondary | ICD-10-CM | POA: Insufficient documentation

## 2018-06-13 DIAGNOSIS — G8929 Other chronic pain: Secondary | ICD-10-CM

## 2018-06-13 DIAGNOSIS — M5416 Radiculopathy, lumbar region: Secondary | ICD-10-CM

## 2018-06-13 MED ORDER — LIDOCAINE HCL 2 % IJ SOLN
20.0000 mL | Freq: Once | INTRAMUSCULAR | Status: AC
  Administered 2018-06-13: 400 mg
  Filled 2018-06-13: qty 40

## 2018-06-13 MED ORDER — MIDAZOLAM HCL 5 MG/5ML IJ SOLN: 1.0000 mg | INTRAMUSCULAR | Status: DC | PRN

## 2018-06-13 MED ORDER — LACTATED RINGERS IV SOLN: 1000.0000 mL | Freq: Once | INTRAVENOUS | Status: DC

## 2018-06-13 MED ORDER — TRIAMCINOLONE ACETONIDE 40 MG/ML IJ SUSP
40.0000 mg | Freq: Once | INTRAMUSCULAR | Status: AC
  Administered 2018-06-13: 40 mg
  Filled 2018-06-13: qty 1

## 2018-06-13 MED ORDER — ROPIVACAINE HCL 2 MG/ML IJ SOLN
1.0000 mL | Freq: Once | INTRAMUSCULAR | Status: AC
  Administered 2018-06-13: 10 mL via EPIDURAL
  Filled 2018-06-13: qty 10

## 2018-06-13 MED ORDER — ROPIVACAINE HCL 2 MG/ML IJ SOLN
2.0000 mL | Freq: Once | INTRAMUSCULAR | Status: AC
  Administered 2018-06-13: 10 mL via EPIDURAL
  Filled 2018-06-13: qty 10

## 2018-06-13 MED ORDER — SODIUM CHLORIDE 0.9% FLUSH
2.0000 mL | Freq: Once | INTRAVENOUS | Status: AC
  Administered 2018-06-13: 10 mL

## 2018-06-13 MED ORDER — DEXAMETHASONE SODIUM PHOSPHATE 10 MG/ML IJ SOLN
10.0000 mg | Freq: Once | INTRAMUSCULAR | Status: AC
  Administered 2018-06-13: 10 mg
  Filled 2018-06-13: qty 1

## 2018-06-13 MED ORDER — FENTANYL CITRATE (PF) 100 MCG/2ML IJ SOLN: 25.0000 ug | INTRAMUSCULAR | Status: DC | PRN

## 2018-06-13 MED ORDER — SODIUM CHLORIDE 0.9% FLUSH
1.0000 mL | Freq: Once | INTRAVENOUS | Status: AC
  Administered 2018-06-13: 10 mL

## 2018-06-13 MED ORDER — IOPAMIDOL (ISOVUE-M 200) INJECTION 41%
10.0000 mL | Freq: Once | INTRAMUSCULAR | Status: AC
  Administered 2018-06-13: 10 mL via EPIDURAL
  Filled 2018-06-13: qty 10

## 2018-06-13 NOTE — Patient Instructions (Signed)

## 2018-06-13 NOTE — Progress Notes (Signed)
Patient's Name: Caleb Edwards  MRN: [id number]  Referring Provider: [facility]  DOB: [birth year]  PCP: [facility]  DOS: 06/13/2018  Note by: Oswaldo Done, MD  Service setting: Ambulatory outpatient  Specialty: Interventional Pain Management  Patient type: Established  Location: [facility] (AMB) Pain Management Facility  Visit type: Interventional Procedure   Primary Reason for Visit: Interventional Pain Management Treatment. CC: Back Pain (right, lower)  Procedure #1:  Anesthesia, Analgesia, Anxiolysis:  Type: Therapeutic Trans-Foraminal Epidural Steroid Injection   #2  Region: Lumbar Level: L4 Paravertebral Laterality: Right-Sided Paravertebral   Type: Local Anesthesia Indication(s): Analgesia         Route: Infiltration ([place]/IM) IV Access: Declined Sedation: Declined  Local Anesthetic: Lidocaine 1-2%  Position: Prone  Procedure #2:    Type: Therapeutic Inter-Laminar Epidural Steroid Injection   #2  Region: Lumbar Level: L3-4 Level. Laterality: Left-Sided Paramedial     Indications: 1. DDD (degenerative disc disease), lumbosacral   2. 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 Chronic lower extremity pain (Left)    Pain Score: Pre-procedure: 8 /10 Post-procedure: 1 /10  Pre-op Assessment:  Caleb Edwards is a 67 y.o. (year old), male patient, seen today for interventional treatment. He  has a past surgical history that includes Esophagogastroduodenoscopy (egd) with propofol (N/A, 01/03/2016); Colonoscopy with propofol (N/A, 08/02/2017); Esophagogastroduodenoscopy (egd) with propofol (08/02/2017); Esophagogastroduodenoscopy (egd) with propofol (N/A, 02/05/2018); Colonoscopy with propofol (N/A, 02/05/2018); and Esophagogastroduodenoscopy (egd) with propofol (N/A, 03/30/2018). Caleb Edwards has a current medication list which includes the following prescription(s): amlodipine, atorvastatin,  chlorthalidone, ferrous sulfate, lactulose, lisinopril, pantoprazole, polyethylene glycol, sildenafil, and tramadol. His primarily concern today is the Back Pain (right, lower)  Initial Vital Signs:  Pulse/HCG Rate: (!) 59ECG Heart Rate: 62 Temp: 98.6 F (37 C) Resp: 16 BP: 140/79 SpO2: 100 %  BMI: Estimated body mass index is 24.14 kg/m as calculated from the following:   Height as of this encounter: 6' (1.829 m).   Weight as of this encounter: 178 lb (80.7 kg).  Risk Assessment: Allergies: Reviewed. He has No Known Allergies.  Allergy Precautions: None required Coagulopathies: Reviewed. None identified.  Blood-thinner therapy: None at this time Active Infection(s): Reviewed. None identified. Caleb Edwards is afebrile  Site Confirmation: Caleb Edwards was asked to confirm the procedure and laterality before marking the site Procedure checklist: Completed Consent: Before the procedure and under the influence of no sedative(s), amnesic(s), or anxiolytics, the patient was informed of the treatment options, risks and possible complications. To fulfill our ethical and legal obligations, as recommended by the American Medical Association's Code of Ethics, I have informed the patient of my clinical impression; the nature and purpose of the treatment or procedure; the risks, benefits, and possible complications of the intervention; the alternatives, including doing nothing; the risk(s) and benefit(s) of the alternative treatment(s) or procedure(s); and the risk(s) and benefit(s) of doing nothing. The patient was provided information about the general risks and possible complications associated with the procedure. These may include, but are not limited to: failure to achieve desired goals, infection, bleeding, organ or nerve damage, allergic reactions, paralysis, and death. In addition, the patient was informed of those risks and complications associated to Spine-related procedures, such as failure to  decrease pain; infection (i.e.: Meningitis, epidural or intraspinal abscess); bleeding (i.e.: epidural hematoma, subarachnoid hemorrhage, or any other type of intraspinal or peri-dural bleeding); organ or nerve damage (i.e.: Any  type of peripheral nerve, nerve root, or spinal cord injury) with subsequent damage to sensory, motor, and/or autonomic systems, resulting in permanent pain, numbness, and/or weakness of one or several areas of the body; allergic reactions; (i.e.: anaphylactic reaction); and/or death. Furthermore, the patient was informed of those risks and complications associated with the medications. These include, but are not limited to: allergic reactions (i.e.: anaphylactic or anaphylactoid reaction(s)); adrenal axis suppression; blood sugar elevation that in diabetics may result in ketoacidosis or comma; water retention that in patients with history of congestive heart failure may result in shortness of breath, pulmonary edema, and decompensation with resultant heart failure; weight gain; swelling or edema; medication-induced neural toxicity; particulate matter embolism and blood vessel occlusion with resultant organ, and/or nervous system infarction; and/or aseptic necrosis of one or more joints. Finally, the patient was informed that Medicine is not an exact science; therefore, there is also the possibility of unforeseen or unpredictable risks and/or possible complications that may result in a catastrophic outcome. The patient indicated having understood very clearly. We have given the patient no guarantees and we have made no promises. Enough time was given to the patient to ask questions, all of which were answered to the patient's satisfaction. [name] has indicated that he wanted to continue with the procedure. Attestation: I, the ordering provider, attest that I have discussed with the patient the benefits, risks, side-effects, alternatives, likelihood of achieving goals, and potential  problems during recovery for the procedure that I have provided informed consent. Date  Time: 06/13/2018 12:18 PM  Pre-Procedure Preparation:  Monitoring: As per clinic protocol. Respiration, ETCO2, SpO2, BP, heart rate and rhythm monitor placed and checked for adequate function Safety Precautions: Patient was assessed for positional comfort and pressure points before starting the procedure. Time-out: I initiated and conducted the "Time-out" before starting the procedure, as per protocol. The patient was asked to participate by confirming the accuracy of the "Time Out" information. Verification of the correct person, site, and procedure were performed and confirmed by me, the nursing staff, and the patient. "Time-out" conducted as per Joint Commission's Universal Protocol (UP.01.01.01). Time: 1256  Description of Procedure #1:  Target Area: The inferior and lateral portion of the pedicle, just lateral to a line created by the 6:00 position of the pedicle and the superior articular process of the vertebral body below. On the lateral view, this target lies just posterior to the anterior aspect of the lamina and posterior to the midpoint created between the anterior and the posterior aspect of the neural foramina. Approach: Posterior paravertebral approach. Area Prepped: Same as above Prepping solution: Same as above Safety Precautions: Same as above Description of the Procedure: Protocol guidelines were followed. The patient was placed in position over the fluoroscopy table. The target area was identified and the area prepped in the usual manner. Skin & deeper tissues infiltrated with local anesthetic. Appropriate amount of time allowed to pass for local anesthetics to take effect. The procedure needles were then advanced to the target area. Proper needle placement secured. Negative aspiration confirmed. Solution injected in intermittent fashion, asking for systemic symptoms every 0.2cc of injectate.  The needles were then removed and the area cleansed, making sure to leave some of the prepping solution back to take advantage of its long term bactericidal properties. Start Time: 1257 hrs. Materials:  Needle(s) Type: Regular needle Gauge: 22G Length: 3.5-in Medication(s): Please see orders for medications and dosing details.  Description of Procedure #2:  Target Area: The  interlaminar space, initially targeting the lower border of the superior vertebral body lamina. Approach: Posterior paramedial approach. Area Prepped: Entire Posterior Lumbosacral Region Prepping solution: ChloraPrep (2% chlorhexidine gluconate and 70% isopropyl alcohol) Safety Precautions: Aspiration looking for blood return was conducted prior to all injections. At no point did we inject any substances, as a needle was being advanced. No attempts were made at seeking any paresthesias. Safe injection practices and needle disposal techniques used. Medications properly checked for expiration dates. SDV (single dose vial) medications used. Description of the Procedure: Protocol guidelines were followed. The patient was placed in position over the fluoroscopy table. The target area was identified and the area prepped in the usual manner. Skin & deeper tissues infiltrated with local anesthetic. Appropriate amount of time allowed to pass for local anesthetics to take effect. The procedure needle was introduced through the skin, ipsilateral to the reported pain, and advanced to the target area. Bone was contacted and the needle walked caudad, until the lamina was cleared. The ligamentum flavum was engaged and loss-of-resistance technique used as the epidural needle was advanced. The epidural space was identified using "loss-of-resistance technique" with 2-3 ml of PF-NaCl (0.9% NSS), in a 5cc LOR glass syringe. Proper needle placement secured. Negative aspiration confirmed. Solution injected in intermittent fashion, asking for systemic  symptoms every 0.5cc of injectate. The needles were then removed and the area cleansed, making sure to leave some of the prepping solution back to take advantage of its long term bactericidal properties.  Vitals:   06/13/18 1300 06/13/18 1305 06/13/18 1310 06/13/18 1315  BP: 140/68 (!) 146/79 (!) 144/79 120/80  Pulse:      Resp: (!) 9 13 14 15   Temp:      TempSrc:      SpO2: 100% 100% 99% 99%  Weight:      Height:        End Time: 1312 hrs. Materials:  Needle(s) Type: Epidural needle Gauge: 17G Length: 3.5-in Medication(s): Please see orders for medications and dosing details.  Imaging Guidance (Spinal):          Type of Imaging Technique: Fluoroscopy Guidance (Spinal) Indication(s): Assistance in needle guidance and placement for procedures requiring needle placement in or near specific anatomical locations not easily accessible without such assistance. Exposure Time: Please see nurses notes. Contrast: Before injecting any contrast, we confirmed that the patient did not have an allergy to iodine, shellfish, or radiological contrast. Once satisfactory needle placement was completed at the desired level, radiological contrast was injected. Contrast injected under live fluoroscopy. No contrast complications. See chart for type and volume of contrast used. Fluoroscopic Guidance: I was personally present during the use of fluoroscopy. "Tunnel Vision Technique" used to obtain the best possible view of the target area. Parallax error corrected before commencing the procedure. "Direction-depth-direction" technique used to introduce the needle under continuous pulsed fluoroscopy. Once target was reached, antero-posterior, oblique, and lateral fluoroscopic projection used confirm needle placement in all planes. Images permanently stored in EMR. Interpretation: I personally interpreted the imaging intraoperatively. Adequate needle placement confirmed in multiple planes. Appropriate spread of  contrast into desired area was observed. No evidence of afferent or efferent intravascular uptake. No intrathecal or subarachnoid spread observed. Permanent images saved into the patient's record.  Antibiotic Prophylaxis:   Anti-infectives (From admission, onward)   None     Indication(s): None identified  Post-operative Assessment:  Post-procedure Vital Signs:  Pulse/HCG Rate: (!) 5970 Temp: 98.6 F (37 C) Resp: 15 BP: 120/80 SpO2: 99 %  EBL: None  Complications: No immediate post-treatment complications observed by team, or reported by patient.  Note: The patient tolerated the entire procedure well. A repeat set of vitals were taken after the procedure and the patient was kept under observation following institutional policy, for this type of procedure. Post-procedural neurological assessment was performed, showing return to baseline, prior to discharge. The patient was provided with post-procedure discharge instructions, including a section on how to identify potential problems. Should any problems arise concerning this procedure, the patient was given instructions to immediately contact us, at any time, without hesitation. In any case, we plan to contact the patient by telephone for a follow-up status report regarding this interventional procedure.  Comments:  No additional relevant information.  Plan of Care    Imaging Orders     DG C-Arm 1-60 Min-No Report  Procedure Orders     Lumbar Epidural Injection     Lumbar Transforaminal Epidural  Medications ordered for procedure: Meds ordered this encounter  Medications  . iopamidol (ISOVUE-M) 41 % intrathecal injection 10 mL    Must be Myelogram-compatible. If not available, you may substitute with a water-soluble, non-ionic, hypoallergenic, myelogram-compatible radiological contrast medium.  Marland Kitchen lidocaine (XYLOCAINE) 2 % (with pres) injection 400 mg  . DISCONTD: midazolam (VERSED) 5 MG/5ML injection 1-2 mg    Make sure  Flumazenil is available in the pyxis when using this medication. If oversedation occurs, administer 0.2 mg IV over 15 sec. If after 45 sec no response, administer 0.2 mg again over 1 min; may repeat at 1 min intervals; not to exceed 4 doses (1 mg)  . DISCONTD: fentaNYL (SUBLIMAZE) injection 25-50 mcg    Make sure Narcan is available in the pyxis when using this medication. In the event of respiratory depression (RR< 8/min): Titrate NARCAN (naloxone) in increments of 0.1 to 0.2 mg IV at 2-3 minute intervals, until desired degree of reversal.  . DISCONTD: lactated ringers infusion 1,000 mL  . sodium chloride flush (NS) 0.9 % injection 2 mL  . ropivacaine (PF) 2 mg/mL (0.2%) (NAROPIN) injection 2 mL  . triamcinolone acetonide (KENALOG-40) injection 40 mg  . sodium chloride flush (NS) 0.9 % injection 1 mL  . ropivacaine (PF) 2 mg/mL (0.2%) (NAROPIN) injection 1 mL  . dexamethasone (DECADRON) injection 10 mg   Medications administered: We administered iopamidol, lidocaine, sodium chloride flush, ropivacaine (PF) 2 mg/mL (0.2%), triamcinolone acetonide, sodium chloride flush, ropivacaine (PF) 2 mg/mL (0.2%), and dexamethasone.  See the medical record for exact dosing, route, and time of administration.  New Prescriptions   No medications on file   Disposition: Discharge home  Discharge Date & Time: 06/13/2018; 1322 hrs.   Physician-requested Follow-up: Return for post-procedure eval (2 wks), w/ Dr. Laban Emperor.  Future Appointments  Date Time Provider Department Center  06/15/2018  2:45 PM Sondra Come, MD [facility] None  06/22/2018  1:45 PM Wyline Mood, MD [facility] None  06/28/2018  1:15 PM Delano Metz, MD [facility] None  09/13/2018  8:30 AM Barbette Merino, NP [facility] None   Primary Care Physician: [facility] Location: [facility] Outpatient Pain Management Facility Note by: Oswaldo Done, MD Date: 06/13/2018; Time: 1:41 PM  Disclaimer:  Medicine is not an Doctor, general practice. The only guarantee in medicine is that nothing is guaranteed. It is important to note that the decision to proceed with this intervention was based on the information collected from the patient. The Data and conclusions were drawn from the patient's questionnaire, the  interview, and the physical examination. Because the information was provided in large part by the patient, it cannot be guaranteed that it has not been purposely or unconsciously manipulated. Every effort has been made to obtain as much relevant data as possible for this evaluation. It is important to note that the conclusions that lead to this procedure are derived in large part from the available data. Always take into account that the treatment will also be dependent on availability of resources and existing treatment guidelines, considered by other Pain Management Practitioners as being common knowledge and practice, at the time of the intervention. For Medico-Legal purposes, it is also important to point out that variation in procedural techniques and pharmacological choices are the acceptable norm. The indications, contraindications, technique, and results of the above procedure should only be interpreted and judged by a Board-Certified Interventional Pain Specialist with extensive familiarity and expertise in the same exact procedure and technique.

## 2018-06-13 NOTE — Progress Notes (Signed)
Safety precautions to be maintained throughout the outpatient stay will include: orient to surroundings, keep bed in low position, maintain call bell within reach at all times, provide assistance with transfer out of bed and ambulation.  

## 2018-06-14 ENCOUNTER — Telehealth: Payer: Self-pay

## 2018-06-14 NOTE — Telephone Encounter (Signed)
Attempted to call patient. NO answer and no answering machine.

## 2018-06-15 ENCOUNTER — Ambulatory Visit: Payer: BLUE CROSS/BLUE SHIELD | Admitting: Urology

## 2018-06-22 ENCOUNTER — Ambulatory Visit: Payer: Medicare HMO | Admitting: Gastroenterology

## 2018-06-26 NOTE — Progress Notes (Signed)
Patient's Name: Caleb Edwards  MRN: [id number]  Referring Provider: Inc, [place]: [birth year]  PCP: [facility], [place]  DOS: 06/28/2018  Note by: Gaspar Cola, MD  Service setting: Ambulatory outpatient  Specialty: Interventional Pain Management  Location: [facility] (AMB) Pain Management Facility    Patient type: Established   Primary Reason(s) for Visit: Encounter for post-procedure evaluation of chronic illness with mild to moderate exacerbation CC: Back Pain  HPI  Caleb Edwards is a 67 y.o. year old, male patient, who comes today for a post-procedure evaluation. 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 His primarily concern today is the Back Pain  Pain Assessment: Location: Lower Back Radiating: Denies Onset: More than a month ago Duration: Chronic pain Quality: Dull Severity: 7 /10 (subjective, self-reported pain score)  Note: Reported level is inconsistent with clinical observations. Clinically the patient looks like a 1/10 A 1/10 is viewed as "Mild"  and described as nagging, annoying, but not interfering with basic activities of daily living (ADL). Caleb Edwards able to eat, bathe, get dressed, do toileting (being able to get on and off the toilet and perform personal hygiene functions), transfer (move in and out of bed or a chair without assistance), and maintain continence (able to control bladder and bowel functions). Physiologic parameters such as blood pressure and heart rate apear wnl. Caleb Edwards to use a standard subjective pain scale, rather than an objective pain scale as instructed. When using our objective Pain Scale, levels between 6 and 10/10 are said to belong in an emergency room, as it progressively worsens from a 6/10, described as severely limiting, requiring emergency care not usually available at an outpatient pain management facility. At a 6/10 level, communication becomes difficult and requires great effort. Assistance to reach the emergency department may be required. Facial flushing and profuse sweating along with potentially dangerous increases in heart rate and blood pressure will be evident. Effect on ADL: slows me down Timing: Intermittent Modifying factors: procedures, medications BP: (!) 128/98  HR: 65  Caleb Edwards comes in today for post-procedure evaluation after the treatment done on 06/14/2018.  Further details on both, my assessment(s), as well as the proposed treatment plan, please see below.  Post-Procedure Assessment  06/13/2018 Procedure: Diagnostic right-sided L4 transforaminal ESI #2 + left-sided L3-4 interlaminar ESI #2 under fluoroscopic guidance, no sedation Pre-procedure pain score:  8/10 Post-procedure pain score: 1/10 (> 50% relief) Influential Factors: BMI: 24.14 kg/m Intra-procedural challenges: None observed.         Assessment challenges: None detected.              Reported side-effects: None.        Post-procedural adverse reactions or complications: None reported  Sedation: No sedation used. When no sedatives are used, the analgesic levels obtained are directly associated to the effectiveness of the local anesthetics. However, when sedation is provided, the level of analgesia obtained during the initial 1 hour following the intervention, is believed to be the result of a combination of factors. These factors may include, but are not limited to: 1. The effectiveness of the local anesthetics used. 2. The effects of the analgesic(s) and/or anxiolytic(s) used. 3. The degree of discomfort experienced by the patient at the time of the procedure. 4. The patients ability and reliability in recalling and recording the events. 5. The presence and influence of possible secondary gains and/or psychosocial factors. Reported result: Relief experienced during the 1st hour after the procedure: 100 % (Ultra-Short Term Relief) Caleb Edwards has indicated area to have been numb during this time. Interpretative annotation: Clinically appropriate result. No IV Analgesic or Anxiolytic given, therefore benefits are completely due to Local Anesthetic effects.          Effects of local anesthetic: The analgesic effects attained during this period are directly associated to the localized infiltration of local anesthetics and therefore cary significant diagnostic value as to the etiological location, or anatomical origin, of the pain. Expected duration of relief is directly dependent on the pharmacodynamics of the local anesthetic used. Long-acting (4-6 hours) anesthetics used.  Reported result: Relief during the next 4 to 6 hour after the procedure: 100 %(last for 2 weeks) (Short-Term Relief) Caleb Edwards has indicated area to have been numb during this time. Interpretative annotation: Clinically appropriate result. Analgesia during this period is likely to be Local Anesthetic-related.          Long-term benefit: Defined as the period of time past the expected duration of local anesthetics  (1 hour for short-acting and 4-6 hours for long-acting). With the possible exception of prolonged sympathetic blockade from the local anesthetics, benefits during this period are typically attributed to, or associated with, other factors such as analgesic sensory neuropraxia, antiinflammatory effects, or beneficial biochemical changes provided by agents other than the local anesthetics.  Reported result: Extended relief following procedure: 85 % (Long-Term Relief)            Interpretative annotation: Clinically possible results. Good relief. No permanent benefit expected. Inflammation plays a part in the etiology to the pain.          Current benefits: Defined as reported results that persistent at this point in time.   Analgesia: 85 % Mr. [name] reports the extremity pain improved more than the axial pain. Function: Mr. Weckerly reports improvement in function ROM: Mr. Crean reports improvement in ROM Interpretative annotation: Ongoing benefit. Therapeutic benefit observed. Effective therapeutic approach.          Interpretation: Results would suggest a successful diagnostic intervention.                  Plan:  Set up procedure as a PRN palliative treatment option for this patient.                Laboratory Chemistry  Inflammation Markers (CRP: Acute Phase) (ESR: Chronic Phase) Lab Results  Component Value Date   CRP 0.6 12/26/2015   ESRSEDRATE 3 12/26/2015                         Renal Markers Lab Results  Component Value Date   BUN 12 02/06/2018   CREATININE 1.18 02/06/2018   GFRAA >60  02/06/2018   GFRNONAA >60 02/06/2018                             Hepatic Markers Lab Results  Component Value Date   AST 21 02/03/2018   ALT 16 (L) 02/03/2018   ALBUMIN 3.8 02/03/2018                        Neuropathy Markers Lab Results  Component Value Date   VITAMINB12 613 12/26/2015   HIV Non Reactive 08/01/2017                        Hematology Parameters Lab Results  Component  Value Date   INR 1.24 01/03/2016   LABPROT 15.8 (H) 01/03/2016   PLT 288 02/13/2018   HGB 11.3 (L) 02/13/2018   HCT 33.5 (L) 02/13/2018                        CV Markers Lab Results  Component Value Date   TROPONINI <0.03 01/02/2016                         Note: Lab results reviewed.  Recent Imaging Results   Results for orders placed in visit on 06/13/18  DG C-Arm 1-60 Min-No Report   Narrative Fluoroscopy was utilized by the requesting physician.  No radiographic  interpretation.    Interpretation Report: Fluoroscopy was used during the procedure to assist with needle guidance. The images were interpreted intraoperatively by the requesting physician.  Meds   Current Outpatient Medications:  .  amLODipine (NORVASC) 10 MG tablet, Take 1 tablet by mouth daily., Disp: , Rfl: 3 .  atorvastatin (LIPITOR) 20 MG tablet, Take 1 tablet by mouth at bedtime., Disp: , Rfl: 3 .  chlorthalidone (HYGROTON) 50 MG tablet, Take 1 tablet by mouth daily., Disp: , Rfl: 3 .  ferrous sulfate 325 (65 FE) MG tablet, Take 325 mg by mouth daily with breakfast., Disp: , Rfl:  .  lactulose (KRISTALOSE) 20 g packet, Take 1 packet (20 g total) by mouth 3 (three) times daily., Disp: 30 each, Rfl: 3 .  lisinopril (PRINIVIL,ZESTRIL) 40 MG tablet, Take 1 tablet by mouth daily., Disp: , Rfl: 3 .  pantoprazole (PROTONIX) 40 MG tablet, Take 1 tablet (40 mg total) by mouth 2 (two) times daily before a meal., Disp: 60 tablet, Rfl: 0 .  polyethylene glycol (MIRALAX / GLYCOLAX) packet, Take 17 g by mouth daily., Disp: 30 each, Rfl: 0 .  sildenafil (VIAGRA) 100 MG tablet, Take 100 mg by mouth daily as needed for erectile dysfunction., Disp: , Rfl:  .  traMADol (ULTRAM) 50 MG tablet, Take 2 tablets (100 mg total) by mouth every 6 (six) hours as needed for severe pain., Disp: 240 tablet, Rfl: 5  ROS  Constitutional: Denies any fever or chills Gastrointestinal: No reported hemesis, hematochezia, vomiting, or acute GI  distress Musculoskeletal: Denies any acute onset joint swelling, redness, loss of ROM, or weakness Neurological: No reported episodes of acute onset apraxia, aphasia, dysarthria, agnosia, amnesia, paralysis, loss of coordination, or loss of consciousness  Allergies  Mr. Hofmann has No Known Allergies.  PFSH  Drug: Mr. Leverich  reports that he does not use drugs. Alcohol:  reports that he does not drink alcohol. Tobacco:  reports that he has quit smoking. He has never used  smokeless tobacco. Medical:  has a past medical history of Back pain, Blood transfusion without reported diagnosis (12/2015), Diverticulitis, Hypertension, and Ulcer, stomach peptic, chronic. Surgical: Mr. Fambro  has a past surgical history that includes Esophagogastroduodenoscopy (egd) with propofol (N/A, 01/03/2016); Colonoscopy with propofol (N/A, 08/02/2017); Esophagogastroduodenoscopy (egd) with propofol (08/02/2017); Esophagogastroduodenoscopy (egd) with propofol (N/A, 02/05/2018); Colonoscopy with propofol (N/A, 02/05/2018); and Esophagogastroduodenoscopy (egd) with propofol (N/A, 03/30/2018). Family: family history includes Diabetes in his mother; Heart disease in his father; Prostate cancer in his brother.  Constitutional Exam  General appearance: Well nourished, well developed, and well hydrated. In no apparent acute distress Vitals:   06/28/18 1315  BP: (!) 128/98  Pulse: 65  Temp: 98.1 F (36.7 C)  SpO2: 100%  Weight: 178 lb (80.7 kg)  Height: 6' (1.829 m)   BMI Assessment: Estimated body mass index is 24.14 kg/m as calculated from the following:   Height as of this encounter: 6' (1.829 m).   Weight as of this encounter: 178 lb (80.7 kg).  BMI interpretation table: BMI level Category Range association with higher incidence of chronic pain  <18 kg/m2 Underweight   18.5-24.9 kg/m2 Ideal body weight   25-29.9 kg/m2 Overweight Increased incidence by 20%  30-34.9 kg/m2 Obese (Class I) Increased incidence by 68%   35-39.9 kg/m2 Severe obesity (Class II) Increased incidence by 136%  >40 kg/m2 Extreme obesity (Class III) Increased incidence by 254%   Patient's current BMI Ideal Body weight  Body mass index is 24.14 kg/m. Ideal body weight: 77.6 kg (171 lb 1.2 oz) Adjusted ideal body weight: 78.9 kg (173 lb 13.5 oz)   BMI Readings from Last 4 Encounters:  06/28/18 24.14 kg/m  06/13/18 24.14 kg/m  03/30/18 23.73 kg/m  03/14/18 24.41 kg/m   Wt Readings from Last 4 Encounters:  06/28/18 178 lb (80.7 kg)  06/13/18 178 lb (80.7 kg)  03/30/18 175 lb (79.4 kg)  03/14/18 180 lb (81.6 kg)  Psych/Mental status: Alert, oriented x 3 (person, place, & time)       Eyes: PERLA Respiratory: No evidence of acute respiratory distress  Cervical Spine Area Exam  Skin & Axial Inspection: No masses, redness, edema, swelling, or associated skin lesions Alignment: Symmetrical Functional ROM: Unrestricted ROM      Stability: No instability detected Muscle Tone/Strength: Functionally intact. No obvious neuro-muscular anomalies detected. Sensory (Neurological): Unimpaired Palpation: No palpable anomalies              Upper Extremity (UE) Exam    Side: Right upper extremity  Side: Left upper extremity  Skin & Extremity Inspection: Skin color, temperature, and hair growth are WNL. No peripheral edema or cyanosis. No masses, redness, swelling, asymmetry, or associated skin lesions. No contractures.  Skin & Extremity Inspection: Skin color, temperature, and hair growth are WNL. No peripheral edema or cyanosis. No masses, redness, swelling, asymmetry, or associated skin lesions. No contractures.  Functional ROM: Unrestricted ROM          Functional ROM: Unrestricted ROM          Muscle Tone/Strength: Functionally intact. No obvious neuro-muscular anomalies detected.  Muscle Tone/Strength: Functionally intact. No obvious neuro-muscular anomalies detected.  Sensory (Neurological): Unimpaired          Sensory  (Neurological): Unimpaired          Palpation: No palpable anomalies              Palpation: No palpable anomalies  Provocative Test(s):  Phalen's test: deferred Tinel's test: deferred Apley's scratch test (touch opposite shoulder):  Action 1 (Across chest): deferred Action 2 (Overhead): deferred Action 3 (LB reach): deferred   Provocative Test(s):  Phalen's test: deferred Tinel's test: deferred Apley's scratch test (touch opposite shoulder):  Action 1 (Across chest): deferred Action 2 (Overhead): deferred Action 3 (LB reach): deferred    Thoracic Spine Area Exam  Skin & Axial Inspection: No masses, redness, or swelling Alignment: Symmetrical Functional ROM: Unrestricted ROM Stability: No instability detected Muscle Tone/Strength: Functionally intact. No obvious neuro-muscular anomalies detected. Sensory (Neurological): Unimpaired Muscle strength & Tone: No palpable anomalies  Lumbar Spine Area Exam  Skin & Axial Inspection: No masses, redness, or swelling Alignment: Symmetrical Functional ROM: Improved after treatment       Stability: No instability detected Muscle Tone/Strength: Functionally intact. No obvious neuro-muscular anomalies detected. Sensory (Neurological): Unimpaired Palpation: No palpable anomalies       Provocative Tests: Hyperextension/rotation test: deferred today       Lumbar quadrant test (Kemp's test): deferred today       Lateral bending test: deferred today       Patrick's Maneuver: deferred today                   FABER test: deferred today                   S-I anterior distraction/compression test: deferred today         S-I lateral compression test: deferred today         S-I Thigh-thrust test: deferred today         S-I Gaenslen's test: deferred today          Gait & Posture Assessment  Ambulation: Unassisted Gait: Relatively normal for age and body habitus Posture: WNL   Lower Extremity Exam    Side: Right lower extremity   Side: Left lower extremity  Stability: No instability observed          Stability: No instability observed          Skin & Extremity Inspection: Skin color, temperature, and hair growth are WNL. No peripheral edema or cyanosis. No masses, redness, swelling, asymmetry, or associated skin lesions. No contractures.  Skin & Extremity Inspection: Skin color, temperature, and hair growth are WNL. No peripheral edema or cyanosis. No masses, redness, swelling, asymmetry, or associated skin lesions. No contractures.  Functional ROM: Unrestricted ROM                  Functional ROM: Unrestricted ROM                  Muscle Tone/Strength: Functionally intact. No obvious neuro-muscular anomalies detected.  Muscle Tone/Strength: Functionally intact. No obvious neuro-muscular anomalies detected.  Sensory (Neurological): Unimpaired  Sensory (Neurological): Unimpaired  Palpation: No palpable anomalies  Palpation: No palpable anomalies   Assessment  Primary Diagnosis & Pertinent Problem List: The primary encounter diagnosis was Chronic low back pain (Primary Source of Pain) (Bilateral) (R>L). Diagnoses of Chronic lower extremity pain (Left), Chronic neck pain, and Chronic pain syndrome were also pertinent to this visit.  Status Diagnosis  Improved Resolved Controlled 1. Chronic low back pain (Primary Source of Pain) (Bilateral) (R>L)   2. Chronic lower extremity pain (Left)   3. Chronic neck pain   4. Chronic pain syndrome     Problems updated and reviewed during this visit: Problem  Abdominal Pain  Lumbar facet syndrome (Bilateral) (R>L)  Ckd (Chronic Kidney Disease), Stage III (Hcc)  Rectal Bleeding  Blood in Stool  GI Bleed  Vitamin D Insufficiency  Htn (Hypertension)  Acute Blood Loss Anemia  Near Syncope  Symptomatic Anemia   Plan of Care  Pharmacotherapy (Medications Ordered): No orders of the defined types were placed in this encounter.  Medications administered today: Jackelyn Knife had no medications administered during this visit.  Procedure Orders    No procedure(s) ordered today   Lab Orders  No laboratory test(s) ordered today   Imaging Orders  No imaging studies ordered today   Referral Orders  No referral(s) requested today   Interventional management options: Planned, scheduled, and/or pending:   None at this time   Considering:   Diagnostic bilateral lumbar facet block  Possible bilateral lumbar facet RFA  Diagnostic bilateral L4-5 transforaminal ESI  Diagnostic/therapeutic right-sided L4 transforaminal ESI #3  Diagnostic/therapeutic left-sided L3-4 interlaminar ESI #3    Palliative PRN treatment(s):   None at this time   Provider-requested follow-up: Return for PRN Procedure: Diagnostic right-sided L4 transforaminal ESI #3 + left-sided L3-4 interlaminar ESI #3.  Future Appointments  Date Time Provider [place]  09/13/2018  8:30 AM Vevelyn Francois, NP [facility] None   Primary Care Physician: [facility], [place] Location: [facility] Facility Note by: Gaspar Cola, MD Date: 06/28/2018; Time: 1:40 PM

## 2018-06-28 ENCOUNTER — Ambulatory Visit: Payer: Medicare HMO | Attending: Pain Medicine | Admitting: Pain Medicine

## 2018-06-28 ENCOUNTER — Other Ambulatory Visit: Payer: Self-pay

## 2018-06-28 ENCOUNTER — Encounter: Payer: Self-pay | Admitting: Pain Medicine

## 2018-06-28 VITALS — BP 128/98 | HR 65 | Temp 98.1°F | Ht 72.0 in | Wt 178.0 lb

## 2018-06-28 DIAGNOSIS — R109 Unspecified abdominal pain: Secondary | ICD-10-CM | POA: Diagnosis not present

## 2018-06-28 DIAGNOSIS — G4700 Insomnia, unspecified: Secondary | ICD-10-CM | POA: Diagnosis not present

## 2018-06-28 DIAGNOSIS — M5442 Lumbago with sciatica, left side: Secondary | ICD-10-CM | POA: Diagnosis not present

## 2018-06-28 DIAGNOSIS — Z79891 Long term (current) use of opiate analgesic: Secondary | ICD-10-CM | POA: Insufficient documentation

## 2018-06-28 DIAGNOSIS — Z8249 Family history of ischemic heart disease and other diseases of the circulatory system: Secondary | ICD-10-CM | POA: Insufficient documentation

## 2018-06-28 DIAGNOSIS — Z79899 Other long term (current) drug therapy: Secondary | ICD-10-CM | POA: Insufficient documentation

## 2018-06-28 DIAGNOSIS — I129 Hypertensive chronic kidney disease with stage 1 through stage 4 chronic kidney disease, or unspecified chronic kidney disease: Secondary | ICD-10-CM | POA: Diagnosis not present

## 2018-06-28 DIAGNOSIS — E559 Vitamin D deficiency, unspecified: Secondary | ICD-10-CM | POA: Insufficient documentation

## 2018-06-28 DIAGNOSIS — Z87891 Personal history of nicotine dependence: Secondary | ICD-10-CM | POA: Diagnosis not present

## 2018-06-28 DIAGNOSIS — M1288 Other specific arthropathies, not elsewhere classified, other specified site: Secondary | ICD-10-CM | POA: Diagnosis not present

## 2018-06-28 DIAGNOSIS — M4317 Spondylolisthesis, lumbosacral region: Secondary | ICD-10-CM | POA: Insufficient documentation

## 2018-06-28 DIAGNOSIS — M79605 Pain in left leg: Secondary | ICD-10-CM | POA: Insufficient documentation

## 2018-06-28 DIAGNOSIS — M47892 Other spondylosis, cervical region: Secondary | ICD-10-CM | POA: Diagnosis not present

## 2018-06-28 DIAGNOSIS — M542 Cervicalgia: Secondary | ICD-10-CM

## 2018-06-28 DIAGNOSIS — R55 Syncope and collapse: Secondary | ICD-10-CM | POA: Insufficient documentation

## 2018-06-28 DIAGNOSIS — D62 Acute posthemorrhagic anemia: Secondary | ICD-10-CM | POA: Diagnosis not present

## 2018-06-28 DIAGNOSIS — G894 Chronic pain syndrome: Secondary | ICD-10-CM | POA: Insufficient documentation

## 2018-06-28 DIAGNOSIS — M5416 Radiculopathy, lumbar region: Secondary | ICD-10-CM | POA: Insufficient documentation

## 2018-06-28 DIAGNOSIS — M48061 Spinal stenosis, lumbar region without neurogenic claudication: Secondary | ICD-10-CM | POA: Diagnosis not present

## 2018-06-28 DIAGNOSIS — M7918 Myalgia, other site: Secondary | ICD-10-CM | POA: Diagnosis not present

## 2018-06-28 DIAGNOSIS — Z5181 Encounter for therapeutic drug level monitoring: Secondary | ICD-10-CM | POA: Insufficient documentation

## 2018-06-28 DIAGNOSIS — K921 Melena: Secondary | ICD-10-CM | POA: Insufficient documentation

## 2018-06-28 DIAGNOSIS — M47896 Other spondylosis, lumbar region: Secondary | ICD-10-CM | POA: Diagnosis not present

## 2018-06-28 DIAGNOSIS — M545 Low back pain: Secondary | ICD-10-CM | POA: Diagnosis not present

## 2018-06-28 DIAGNOSIS — M5137 Other intervertebral disc degeneration, lumbosacral region: Secondary | ICD-10-CM | POA: Insufficient documentation

## 2018-06-28 DIAGNOSIS — N183 Chronic kidney disease, stage 3 (moderate): Secondary | ICD-10-CM | POA: Diagnosis not present

## 2018-06-28 DIAGNOSIS — G8929 Other chronic pain: Secondary | ICD-10-CM

## 2018-09-11 ENCOUNTER — Ambulatory Visit: Payer: Medicare HMO | Attending: Nurse Practitioner | Admitting: Nurse Practitioner

## 2018-09-11 ENCOUNTER — Other Ambulatory Visit: Payer: Self-pay

## 2018-09-11 ENCOUNTER — Encounter: Payer: Self-pay | Admitting: Nurse Practitioner

## 2018-09-11 VITALS — BP 152/87 | HR 81 | Temp 98.7°F | Resp 16 | Ht 72.0 in | Wt 180.4 lb

## 2018-09-11 DIAGNOSIS — Z8249 Family history of ischemic heart disease and other diseases of the circulatory system: Secondary | ICD-10-CM | POA: Insufficient documentation

## 2018-09-11 DIAGNOSIS — N183 Chronic kidney disease, stage 3 (moderate): Secondary | ICD-10-CM | POA: Diagnosis not present

## 2018-09-11 DIAGNOSIS — M48061 Spinal stenosis, lumbar region without neurogenic claudication: Secondary | ICD-10-CM | POA: Insufficient documentation

## 2018-09-11 DIAGNOSIS — M47812 Spondylosis without myelopathy or radiculopathy, cervical region: Secondary | ICD-10-CM | POA: Insufficient documentation

## 2018-09-11 DIAGNOSIS — Z5181 Encounter for therapeutic drug level monitoring: Secondary | ICD-10-CM | POA: Diagnosis present

## 2018-09-11 DIAGNOSIS — M79605 Pain in left leg: Secondary | ICD-10-CM | POA: Insufficient documentation

## 2018-09-11 DIAGNOSIS — M5137 Other intervertebral disc degeneration, lumbosacral region: Secondary | ICD-10-CM | POA: Diagnosis not present

## 2018-09-11 DIAGNOSIS — M4726 Other spondylosis with radiculopathy, lumbar region: Secondary | ICD-10-CM | POA: Diagnosis not present

## 2018-09-11 DIAGNOSIS — Z79899 Other long term (current) drug therapy: Secondary | ICD-10-CM | POA: Insufficient documentation

## 2018-09-11 DIAGNOSIS — Z8042 Family history of malignant neoplasm of prostate: Secondary | ICD-10-CM | POA: Diagnosis not present

## 2018-09-11 DIAGNOSIS — M47816 Spondylosis without myelopathy or radiculopathy, lumbar region: Secondary | ICD-10-CM

## 2018-09-11 DIAGNOSIS — G894 Chronic pain syndrome: Secondary | ICD-10-CM | POA: Diagnosis not present

## 2018-09-11 DIAGNOSIS — G8929 Other chronic pain: Secondary | ICD-10-CM

## 2018-09-11 DIAGNOSIS — I129 Hypertensive chronic kidney disease with stage 1 through stage 4 chronic kidney disease, or unspecified chronic kidney disease: Secondary | ICD-10-CM | POA: Insufficient documentation

## 2018-09-11 DIAGNOSIS — Z79891 Long term (current) use of opiate analgesic: Secondary | ICD-10-CM | POA: Insufficient documentation

## 2018-09-11 DIAGNOSIS — M545 Low back pain: Secondary | ICD-10-CM | POA: Insufficient documentation

## 2018-09-11 DIAGNOSIS — E559 Vitamin D deficiency, unspecified: Secondary | ICD-10-CM | POA: Diagnosis not present

## 2018-09-11 DIAGNOSIS — D62 Acute posthemorrhagic anemia: Secondary | ICD-10-CM | POA: Diagnosis not present

## 2018-09-11 DIAGNOSIS — Z9889 Other specified postprocedural states: Secondary | ICD-10-CM | POA: Insufficient documentation

## 2018-09-11 DIAGNOSIS — Z87891 Personal history of nicotine dependence: Secondary | ICD-10-CM | POA: Diagnosis not present

## 2018-09-11 MED ORDER — TRAMADOL HCL 50 MG PO TABS: 100.0000 mg | ORAL_TABLET | Freq: Four times a day (QID) | ORAL | 5 refills | Status: DC | PRN

## 2018-09-11 NOTE — Progress Notes (Signed)
`Patient's Name: Caleb Edwards  MRN: [id number]  Referring Provider: Inc, [place]: 05/23/1951  PCP: [facility], [place]: 09/11/2018  Note by: Vevelyn Francois NP  Service setting: Ambulatory outpatient  Specialty: Interventional Pain Management  Location: [facility] (AMB) Pain Management Facility    Patient type: Established    Primary Reason(s) for Visit: Encounter for prescription drug management. (Level of risk: moderate)  CC: Back Pain (lower)  HPI  [name] is a 67 y.o. year old, male patient, who comes today for a medication management evaluation. 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 His primarily concern today is the Back Pain (lower)  Pain Assessment: Location: Lower Back Radiating: sometimes down the backs of both legs all the way to include all toes, bilaterally Onset: More than a month ago Duration: Chronic pain Quality: Sharp(shooting) Severity: 8 /10 (subjective, self-reported pain score)  Note: Reported level is compatible with  observation. Clinically the patient looks like a 2/10 A 2/10 is viewed as "Mild to Moderate" and described as noticeable and distracting. Impossible to hide from other people. More frequent flare-ups. Still possible to adapt and function close to normal. It can be very annoying and may have occasional stronger flare-ups. With discipline, patients may get used to it and adapt.       When using our objective Pain Scale, levels between 6 and 10/10 are said to belong in an emergency room, as it progressively worsens from a 6/10, described as severely limiting, requiring emergency care not usually available at an outpatient pain management facility. At a 6/10 level, communication becomes difficult and requires great effort. Assistance to reach the emergency department may be required. Facial flushing and profuse sweating along with potentially dangerous increases in heart rate and blood pressure will be evident. Effect on ADL: "sometimes when I bend over I get a shooting pain all the way down my legs" Timing: Intermittent Modifying factors: meds, procedure BP: (!) 152/87  HR: 81  [name] was last scheduled for an appointment on Visit date not found for medication management. During today's appointment we reviewed Mr. BEvettschronic pain status, as well as his outpatient medication regimen.  The patient  reports that he does not use drugs. His body mass index is 24.47 kg/m.  Further details on both, my assessment(s), as well as the proposed treatment plan, please see below.  Controlled Substance Pharmacotherapy Assessment REMS (Risk Evaluation and Mitigation Strategy)  Analgesic:Tramadol 50 mg 2 tablets every 6 hours (400 mg/day) MME/day:40 mg/day  WRise Patience RN  09/11/2018 10:32 AM  Signed Nursing Pain Medication Assessment:  Safety precautions to be maintained throughout the outpatient stay will include: orient to surroundings, keep bed in low position, maintain call bell within reach  at  all times, provide assistance with transfer out of bed and ambulation.  Medication Inspection Compliance: Mr. Debroux did not comply with our request to bring his pills to be counted. He was reminded that bringing the medication bottles, even when empty, is a requirement.  Medication: None brought in. Pill/Patch Count: None available to be counted. Bottle Appearance: No container available. Did not bring bottle(s) to appointment. Filled Date: N/A Last Medication intake:  Today   Pt states he took last pill today and did not think he needed to bring bottle. Instructed pt he is to bring pill bottle to each visit.   Pharmacokinetics: Liberation and absorption (onset of action): WNL Distribution (time to peak effect): WNL Metabolism and excretion (duration of action): WNL         Pharmacodynamics: Desired effects: Analgesia: Mr. Lewman reports >50% benefit. Functional ability: Patient reports that medication allows him to accomplish basic ADLs Clinically meaningful improvement in function (CMIF): Sustained CMIF goals met Perceived effectiveness: Described as relatively effective, allowing for increase in activities of daily living (ADL) Undesirable effects: Side-effects or Adverse reactions: None reported Monitoring: [place] PMP: Online review of the past 72-monthperiod conducted. Compliant with practice rules and regulations Last UDS on record: Summary  Date Value Ref Range Status  03/24/2017 FINAL  Final    Comment:    ==================================================================== TOXASSURE SELECT 13 (MW) ==================================================================== Test                             Result       Flag       Units Drug Present   Tramadol                       PRESENT   O-Desmethyltramadol            PRESENT   N-Desmethyltramadol            PRESENT    Source of tramadol is a prescription medication.    O-desmethyltramadol and N-desmethyltramadol are  expected    metabolites of tramadol. ==================================================================== Test                      Result    Flag   Units      Ref Range   Creatinine              125              mg/dL      >=20 ==================================================================== Declared Medications:  Medication list was not provided. ==================================================================== For clinical consultation, please call [phone] ====================================================================    UDS interpretation: Compliant          Medication Assessment Form: Reviewed. Patient indicates being compliant with therapy Treatment compliance: Compliant Risk Assessment Profile: Aberrant behavior: See prior evaluations. None observed or detected today Comorbid factors increasing risk of overdose: See prior notes. No additional risks detected today Opioid risk tool (ORT) (Total Score): 0 Personal History of Substance Abuse (SUD-Substance use disorder):  Alcohol: Negative  Illegal Drugs: Negative  Rx Drugs: Negative  ORT Risk Level calculation: Low Risk Risk of substance use disorder (SUD): Low Opioid Risk Tool - 09/11/18 1030      Family History of Substance Abuse   Alcohol  Negative    Illegal Drugs  Negative    Rx Drugs  Negative      Personal History of Substance Abuse   Alcohol  Negative    Illegal Drugs  Negative    Rx Drugs  Negative      Age   Age between 20-45 years   No      History of Preadolescent Sexual Abuse   History of Preadolescent Sexual Abuse  Negative or Male      Psychological Disease   Psychological Disease  Negative    Depression  Negative      Total Score   Opioid Risk Tool Scoring  0    Opioid Risk Interpretation  Low Risk      ORT Scoring interpretation table:  Score <3 = Low Risk for SUD  Score between 4-7 = Moderate Risk for SUD  Score >8 = High Risk for Opioid Abuse   Risk Mitigation  Strategies:  Patient Counseling: Covered Patient-Prescriber Agreement (PPA): Present and active  Notification to other healthcare providers: Done  Pharmacologic Plan: No change in therapy, at this time.             Laboratory Chemistry  Inflammation Markers (CRP: Acute Phase) (ESR: Chronic Phase) Lab Results  Component Value Date   CRP 0.6 12/26/2015   ESRSEDRATE 3 12/26/2015                         Rheumatology Markers No results found for: RF, ANA, LABURIC, URICUR, LYMEIGGIGMAB, LYMEABIGMQN, HLAB27                      Renal Function Markers Lab Results  Component Value Date   BUN 12 02/06/2018   CREATININE 1.18 02/06/2018   GFRAA >60 02/06/2018   GFRNONAA >60 02/06/2018                             Hepatic Function Markers Lab Results  Component Value Date   AST 21 02/03/2018   ALT 16 (L) 02/03/2018   ALBUMIN 3.8 02/03/2018   ALKPHOS 65 02/03/2018   LIPASE 24 01/23/2018                        Electrolytes Lab Results  Component Value Date   NA 137 02/06/2018   K 3.6 02/06/2018   CL 106 02/06/2018   CALCIUM 7.9 (L) 02/06/2018   MG 1.9 01/04/2016                        Neuropathy Markers Lab Results  Component Value Date   LYYTKPTW65 681 12/26/2015   HIV Non Reactive 08/01/2017                        CNS Tests No results found for: COLORCSF, APPEARCSF, RBCCOUNTCSF, WBCCSF, POLYSCSF, LYMPHSCSF, EOSCSF, PROTEINCSF, GLUCCSF, JCVIRUS, CSFOLI, IGGCSF                      Bone Pathology Markers Lab Results  Component Value Date   VD25OH 24.0 (L) 12/26/2015                         Coagulation Parameters Lab Results  Component Value Date   INR 1.24 01/03/2016   LABPROT 15.8 (H) 01/03/2016   PLT 288 02/13/2018                        Cardiovascular Markers Lab  Results  Component Value Date   TROPONINI <0.03 01/02/2016   HGB 11.3 (L) 02/13/2018   HCT 33.5 (L) 02/13/2018                         CA Markers No results found for: CEA, CA125, LABCA2                       Note: Lab results reviewed.  Recent Diagnostic Imaging Results  DG C-Arm 1-60 Min-No Report Fluoroscopy was utilized by the requesting physician.  No radiographic  interpretation.   Complexity Note: Imaging results reviewed. Results shared with Mr. Bouwman, using Layman's terms.                         Meds   Current Outpatient Medications:  .  amLODipine (NORVASC) 10 MG tablet, Take 1 tablet by mouth daily., Disp: , Rfl: 3 .  atorvastatin (LIPITOR) 20 MG tablet, Take 1 tablet by mouth at bedtime., Disp: , Rfl: 3 .  chlorthalidone (HYGROTON) 50 MG tablet, Take 1 tablet by mouth daily., Disp: , Rfl: 3 .  ferrous sulfate 325 (65 FE) MG tablet, Take 325 mg by mouth daily with breakfast., Disp: , Rfl:  .  lactulose (KRISTALOSE) 20 g packet, Take 1 packet (20 g total) by mouth 3 (three) times daily., Disp: 30 each, Rfl: 3 .  lisinopril (PRINIVIL,ZESTRIL) 40 MG tablet, Take 1 tablet by mouth daily., Disp: , Rfl: 3 .  pantoprazole (PROTONIX) 40 MG tablet, Take 1 tablet (40 mg total) by mouth 2 (two) times daily before a meal., Disp: 60 tablet, Rfl: 0 .  polyethylene glycol (MIRALAX / GLYCOLAX) packet, Take 17 g by mouth daily., Disp: 30 each, Rfl: 0 .  sildenafil (VIAGRA) 100 MG tablet, Take 100 mg by mouth daily as needed for erectile dysfunction., Disp: , Rfl:  .  traMADol (ULTRAM) 50 MG tablet, Take 2 tablets (100 mg total) by mouth every 6 (six) hours as needed for severe pain., Disp: 240 tablet, Rfl: 5  ROS  Constitutional: Denies any fever or chills Gastrointestinal: No reported hemesis, hematochezia, vomiting, or acute GI distress Musculoskeletal: Denies any acute onset joint swelling, redness, loss of ROM, or weakness Neurological: No reported episodes of acute onset apraxia, aphasia, dysarthria, agnosia, amnesia, paralysis, loss of coordination, or loss of consciousness  Allergies  Mr. Talarico has No Known Allergies.  PFSH  Drug: Mr. Corales  reports that he does  not use drugs. Alcohol:  reports that he does not drink alcohol. Tobacco:  reports that he has quit smoking. He has never used smokeless tobacco. Medical:  has a past medical history of Back pain, Blood transfusion without reported diagnosis (12/2015), Diverticulitis, Hypertension, and Ulcer, stomach peptic, chronic. Surgical: Mr. Bady  has a past surgical history that includes Esophagogastroduodenoscopy (egd) with propofol (N/A, 01/03/2016); Colonoscopy with propofol (N/A, 08/02/2017); Esophagogastroduodenoscopy (egd) with propofol (08/02/2017); Esophagogastroduodenoscopy (egd) with propofol (N/A, 02/05/2018); Colonoscopy with propofol (N/A, 02/05/2018); and Esophagogastroduodenoscopy (egd) with propofol (N/A, 03/30/2018). Family: family history includes Diabetes in his mother; Heart disease in his father; Prostate cancer in his brother.  Constitutional Exam  General appearance: Well nourished, well developed, and well hydrated. In no apparent acute distress Vitals:   09/11/18 1023  BP: (!) 152/87  Pulse: 81  Resp: 16  Temp: 98.7 F (37.1 C)  TempSrc: Oral  SpO2: 100%  Weight: 180 lb 6.4 oz (81.8 kg)  Height: 6' (1.829 m)  Psych/Mental status: Alert, oriented x 3 (person, place, & time)       Eyes: PERLA Respiratory: No evidence of acute respiratory distress    Lumbar Spine Area Exam  Skin & Axial Inspection: No masses, redness, or swelling Alignment: Symmetrical Functional ROM: Unrestricted ROM       Stability: No instability detected Muscle Tone/Strength: Functionally intact. No obvious neuro-muscular anomalies detected. Sensory (Neurological): Unimpaired Palpation: Non-tender         Gait & Posture Assessment  Ambulation: Unassisted Gait: Relatively normal for age and body habitus Posture: WNL   Lower Extremity Exam    Side: Right lower extremity  Side: Left lower extremity  Stability: No instability observed          Stability: No instability observed          Skin &  Extremity Inspection: Skin color, temperature, and hair growth are WNL. No peripheral edema or cyanosis. No masses, redness, swelling, asymmetry, or associated skin lesions. No contractures.  Skin & Extremity Inspection: Skin color, temperature, and hair growth are WNL. No peripheral edema or cyanosis. No masses, redness, swelling, asymmetry, or associated skin lesions. No contractures.  Functional ROM: Unrestricted ROM                  Functional ROM: Unrestricted ROM                  Muscle Tone/Strength: Functionally intact. No obvious neuro-muscular anomalies detected.  Muscle Tone/Strength: Functionally intact. No obvious neuro-muscular anomalies detected.  Sensory (Neurological): Referred pain pattern      with activity   Sensory (Neurological): Referred pain pattern      with activity      Palpation: No palpable anomalies  Palpation: No palpable anomalies   Assessment  Primary Diagnosis & Pertinent Problem List: The primary encounter diagnosis was Lumbar spondylosis. Diagnoses of Cervical spondylosis, Chronic lower extremity pain (Left), and Chronic pain syndrome were also pertinent to this visit.  Status Diagnosis  Controlled Controlled Controlled 1. Lumbar spondylosis   2. Cervical spondylosis   3. Chronic lower extremity pain (Left)   4. Chronic pain syndrome     Problems updated and reviewed during this visit: No problems updated. Plan of Care  Pharmacotherapy (Medications Ordered): Meds ordered this encounter  Medications  . traMADol (ULTRAM) 50 MG tablet    Sig: Take 2 tablets (100 mg total) by mouth every 6 (six) hours as needed for severe pain.    Dispense:  240 tablet    Refill:  5    Do not place this medication, or any other prescription from our practice, on "Automatic Refill". Patient may have prescription filled one day early if pharmacy is closed on scheduled refill date.    Order Specific Question:   Supervising Provider    Answer:   Milinda Pointer [[id number]]    New Prescriptions   No medications on file   Medications administered today: Jackelyn Knife had no medications administered during this visit. Lab-work, procedure(s), and/or referral(s): No orders of the defined types were placed in this encounter.  Imaging and/or referral(s): None  Interventional management options: Planned, scheduled, and/or pending:   None at this time   Considering:   Diagnostic bilateral lumbar facet block  Possible bilateral lumbar facet RFA  Diagnostic bilateral L4-5 transforaminal ESI  Diagnostic/therapeutic right-sided L4 transforaminal ESI #3  Diagnostic/therapeutic left-sided L3-4 interlaminar ESI #3    Palliative PRN treatment(s):   None at this  time    Provider-requested follow-up: Return in about 6 months (around 03/13/2019) for MedMgmt.  Future Appointments  Date Time Provider [place]  03/13/2019  9:00 AM Vevelyn Francois, NP [facility] None   Primary Care Physician: [facility], [place] Location: [facility] Outpatient Pain Management Facility Note by: Vevelyn Francois NP Date: 09/11/2018; Time: 4:20 PM  Pain Score Disclaimer: We use the NRS-11 scale. This is a self-reported, subjective measurement of pain severity with only modest accuracy. It is used primarily to identify changes within a particular patient. It must be understood that outpatient pain scales are significantly less accurate that those used for research, where they can be applied under ideal controlled circumstances with minimal exposure to variables. In reality, the score is likely to be a combination of pain intensity and pain affect, where pain affect describes the degree of emotional arousal or changes in action readiness caused by the sensory experience of pain. Factors such as social and work situation, setting, emotional state, anxiety levels, expectation, and prior pain experience may influence pain perception and show large inter-individual differences that  may also be affected by time variables.  Patient instructions provided during this appointment: Patient Instructions   ____________________________________________________________________________________________  Medication Rules  Purpose: To inform patients, and their family members, of our rules and regulations.  Applies to: All patients receiving prescriptions (written or electronic).  Pharmacy of record: Pharmacy where electronic prescriptions will be sent. If written prescriptions are taken to a different pharmacy, please inform the nursing staff. The pharmacy listed in the electronic medical record should be the one where you would like electronic prescriptions to be sent.  Electronic prescriptions: In compliance with the [place] (STOP) Act of 2017 (Session Lanny Cramp [phone]), effective October 04, 2018, all controlled substances must be electronically prescribed. Calling prescriptions to the pharmacy will cease to exist.  Prescription refills: Only during scheduled appointments. Applies to all prescriptions.  NOTE: The following applies primarily to controlled substances (Opioid* Pain Medications).   Patient's responsibilities: 1. Pain Pills: Bring all pain pills to every appointment (except for procedure appointments). 2. Pill Bottles: Bring pills in original pharmacy bottle. Always bring the newest bottle. Bring bottle, even if empty. 3. Medication refills: You are responsible for knowing and keeping track of what medications you take and those you need refilled. The day before your appointment: write a list of all prescriptions that need to be refilled. The day of the appointment: give the list to the admitting nurse. Prescriptions will be written only during appointments. If you forget a medication: it will not be "Called in", "Faxed", or "electronically sent". You will need to get another appointment to get these prescribed. No early  refills. Do not call asking to have your prescription filled early. 4. Prescription Accuracy: You are responsible for carefully inspecting your prescriptions before leaving our office. Have the discharge nurse carefully go over each prescription with you, before taking them home. Make sure that your name is accurately spelled, that your address is correct. Check the name and dose of your medication to make sure it is accurate. Check the number of pills, and the written instructions to make sure they are clear and accurate. Make sure that you are given enough medication to last until your next medication refill appointment. 5. Taking Medication: Take medication as prescribed. When it comes to controlled substances, taking less pills or less frequently than prescribed is permitted and encouraged. Never take more pills than instructed. Never  take medication more frequently than prescribed.  6. Inform other Doctors: Always inform, all of your healthcare providers, of all the medications you take. 7. Pain Medication from other Providers: You are not allowed to accept any additional pain medication from any other Doctor or Healthcare provider. There are two exceptions to this rule. (see below) In the event that you require additional pain medication, you are responsible for notifying us, as stated below. 8. Medication Agreement: You are responsible for carefully reading and following our Medication Agreement. This must be signed before receiving any prescriptions from our practice. Safely store a copy of your signed Agreement. Violations to the Agreement will result in no further prescriptions. (Additional copies of our Medication Agreement are available upon request.) 9. Laws, Rules, & Regulations: All patients are expected to follow all Federal and [employer], Rules, [employer]. Ignorance of the Laws does not constitute a valid excuse. The use of any illegal substances is prohibited. 10. Adopted  CDC guidelines & recommendations: Target dosing levels will be at or below 60 MME/day. Use of benzodiazepines** is not recommended.  Exceptions: There are only two exceptions to the rule of not receiving pain medications from other Healthcare Providers. 1. Exception #1 (Emergencies): In the event of an emergency (i.e.: accident requiring emergency care), you are allowed to receive additional pain medication. However, you are responsible for: As soon as you are able, call our office [phone], at any time of the day or night, and leave a message stating your name, the date and nature of the emergency, and the name and dose of the medication prescribed. In the event that your call is answered by a member of our staff, make sure to document and save the date, time, and the name of the person that took your information.  2. Exception #2 (Planned Surgery): In the event that you are scheduled by another doctor or dentist to have any type of surgery or procedure, you are allowed (for a period no longer than 30 days), to receive additional pain medication, for the acute post-op pain. However, in this case, you are responsible for picking up a copy of our "Post-op Pain Management for Surgeons" handout, and giving it to your surgeon or dentist. This document is available at our office, and does not require an appointment to obtain it. Simply go to our office during business hours (Monday-Thursday from 8:00 AM to 4:00 PM) (Friday 8:00 AM to 12:00 Noon) or if you have a scheduled appointment with [place], prior to your surgery, and ask for it by name. In addition, you will need to provide [place] with your name, name of your surgeon, type of surgery, and date of procedure or surgery.  *Opioid medications include: morphine, codeine, oxycodone, oxymorphone, hydrocodone, hydromorphone, meperidine, tramadol, tapentadol, buprenorphine, fentanyl, methadone. **Benzodiazepine medications include: diazepam (Valium), alprazolam  (Xanax), clonazepam (Klonopine), lorazepam (Ativan), clorazepate (Tranxene), chlordiazepoxide (Librium), estazolam (Prosom), oxazepam (Serax), temazepam (Restoril), triazolam (Halcion) (Last updated: 12/01/2017) ____________________________________________________________________________________________    ______________________________________________________________________________________________  Specialty Pain Scale  Introduction:  There are significant differences in how pain is reported. The word pain usually refers to physical pain, but it is also a common synonym of suffering. The medical community uses a scale from 0 (zero) to 10 (ten) to report pain level. Zero (0) is described as "no pain", while ten (10) is described as "the worse pain you can imagine". The problem with this scale is that physical pain is reported along with suffering. Suffering refers to mental pain,  or more often yet it refers to any unpleasant feeling, emotion or aversion associated with the perception of harm or threat of harm. It is the psychological component of pain.  Pain Specialists prefer to separate the two components. The pain scale used by this practice is the Verbal Numerical Rating Scale (VNRS-11). This scale is for the physical pain only. DO NOT INCLUDE how your pain psychologically affects you. This scale is for adults 63 years of age and older. It has 11 (eleven) levels. The 1st level is 0/10. This means: "right now, I have no pain". In the context of pain management, it also means: "right now, my physical pain is under control with the current therapy".  General Information:  The scale should reflect your current level of pain. Unless you are specifically asked for the level of your worst pain, or your average pain. If you are asked for one of these two, then it should be understood that it is over the past 24 hours.  Levels 1 (one) through 5 (five) are described below, and can be treated as an  outpatient. Ambulatory pain management facilities such as ours are more than adequate to treat these levels. Levels 6 (six) through 10 (ten) are also described below, however, these must be treated as a hospitalized patient. While levels 6 (six) and 7 (seven) may be evaluated at an urgent care facility, levels 8 (eight) through 10 (ten) constitute medical emergencies and as such, they belong in a hospital's emergency department. When having these levels (as described below), do not come to our office. Our facility is not equipped to manage these levels. Go directly to an urgent care facility or an emergency department to be evaluated.  Definitions:  Activities of Daily Living (ADL): Activities of daily living (ADL or ADLs) is a term used in healthcare to refer to people's daily self-care activities. Health professionals often use a person's ability or inability to perform ADLs as a measurement of their functional status, particularly in regard to people post injury, with disabilities and the elderly. There are two ADL levels: Basic and Instrumental. Basic Activities of Daily Living (BADL  or BADLs) consist of self-care tasks that include: Bathing and showering; personal hygiene and grooming (including brushing/combing/styling hair); dressing; Toilet hygiene (getting to the toilet, cleaning oneself, and getting back up); eating and self-feeding (not including cooking or chewing and swallowing); functional mobility, often referred to as "transferring", as measured by the ability to walk, get in and out of bed, and get into and out of a chair; the broader definition (moving from one place to another while performing activities) is useful for people with different physical abilities who are still able to get around independently. Basic ADLs include the things many people do when they get up in the morning and get ready to go out of the house: get out of bed, go to the toilet, bathe, dress, groom, and eat. On the  average, loss of function typically follows a particular order. Hygiene is the first to go, followed by loss of toilet use and locomotion. The last to go is the ability to eat. When there is only one remaining area in which the person is independent, there is a 62.9% chance that it is eating and only a 3.5% chance that it is hygiene. Instrumental Activities of Daily Living (IADL or IADLs) are not necessary for fundamental functioning, but they let an individual live independently in a community. IADL consist of tasks that include: cleaning and maintaining  the house; home establishment and maintenance; care of others (including selecting and supervising caregivers); care of pets; child rearing; managing money; managing financials (investments, etc.); meal preparation and cleanup; shopping for groceries and necessities; moving within the community; safety procedures and emergency responses; health management and maintenance (taking prescribed medications); and using the telephone or other form of communication.  Instructions:  Most patients tend to report their pain as a combination of two factors, their physical pain and their psychosocial pain. This last one is also known as "suffering" and it is reflection of how physical pain affects you socially and psychologically. From now on, report them separately.  From this point on, when asked to report your pain level, report only your physical pain. Use the following table for reference.  Pain Clinic Pain Levels (0-5/10)  Pain Level Score  Description  No Pain 0   Mild pain 1 Nagging, annoying, but does not interfere with basic activities of daily living (ADL). Patients are able to eat, bathe, get dressed, toileting (being able to get on and off the toilet and perform personal hygiene functions), transfer (move in and out of bed or a chair without assistance), and maintain continence (able to control bladder and bowel functions). Blood pressure and heart rate  are unaffected. A normal heart rate for a healthy adult ranges from 60 to 100 bpm (beats per minute).   Mild to moderate pain 2 Noticeable and distracting. Impossible to hide from other people. More frequent flare-ups. Still possible to adapt and function close to normal. It can be very annoying and may have occasional stronger flare-ups. With discipline, patients may get used to it and adapt.   Moderate pain 3 Interferes significantly with activities of daily living (ADL). It becomes difficult to feed, bathe, get dressed, get on and off the toilet or to perform personal hygiene functions. Difficult to get in and out of bed or a chair without assistance. Very distracting. With effort, it can be ignored when deeply involved in activities.   Moderately severe pain 4 Impossible to ignore for more than a few minutes. With effort, patients may still be able to manage work or participate in some social activities. Very difficult to concentrate. Signs of autonomic nervous system discharge are evident: dilated pupils (mydriasis); mild sweating (diaphoresis); sleep interference. Heart rate becomes elevated (>115 bpm). Diastolic blood pressure (lower number) rises above 100 mmHg. Patients find relief in laying down and not moving.   Severe pain 5 Intense and extremely unpleasant. Associated with frowning face and frequent crying. Pain overwhelms the senses.  Ability to do any activity or maintain social relationships becomes significantly limited. Conversation becomes difficult. Pacing back and forth is common, as getting into a comfortable position is nearly impossible. Pain wakes you up from deep sleep. Physical signs will be obvious: pupillary dilation; increased sweating; goosebumps; brisk reflexes; cold, clammy hands and feet; nausea, vomiting or dry heaves; loss of appetite; significant sleep disturbance with inability to fall asleep or to remain asleep. When persistent, significant weight loss is observed due  to the complete loss of appetite and sleep deprivation.  Blood pressure and heart rate becomes significantly elevated. Caution: If elevated blood pressure triggers a pounding headache associated with blurred vision, then the patient should immediately seek attention at an urgent or emergency care unit, as these may be signs of an impending stroke.    Emergency Department Pain Levels (6-10/10)  Emergency Room Pain 6 Severely limiting. Requires emergency care and should not be seen  or managed at an outpatient pain management facility. Communication becomes difficult and requires great effort. Assistance to reach the emergency department may be required. Facial flushing and profuse sweating along with potentially dangerous increases in heart rate and blood pressure will be evident.   Distressing pain 7 Self-care is very difficult. Assistance is required to transport, or use restroom. Assistance to reach the emergency department will be required. Tasks requiring coordination, such as bathing and getting dressed become very difficult.   Disabling pain 8 Self-care is no longer possible. At this level, pain is disabling. The individual is unable to do even the most "basic" activities such as walking, eating, bathing, dressing, transferring to a bed, or toileting. Fine motor skills are lost. It is difficult to think clearly.   Incapacitating pain 9 Pain becomes incapacitating. Thought processing is no longer possible. Difficult to remember your own name. Control of movement and coordination are lost.   The worst pain imaginable 10 At this level, most patients pass out from pain. When this level is reached, collapse of the autonomic nervous system occurs, leading to a sudden drop in blood pressure and heart rate. This in turn results in a temporary and dramatic drop in blood flow to the brain, leading to a loss of consciousness. Fainting is one of the body's self defense mechanisms. Passing out puts the brain in  a calmed state and causes it to shut down for a while, in order to begin the healing process.    Summary: 1. Refer to this scale when providing [place] with your pain level. 2. Be accurate and careful when reporting your pain level. This will help with your care. 3. Over-reporting your pain level will lead to loss of credibility. 4. Even a level of 1/10 means that there is pain and will be treated at our facility. 5. High, inaccurate reporting will be documented as "Symptom Exaggeration", leading to loss of credibility and suspicions of possible secondary gains such as obtaining more narcotics, or wanting to appear disabled, for fraudulent reasons. 6. Only pain levels of 5 or below will be seen at our facility. 7. Pain levels of 6 and above will be sent to the Emergency Department and the appointment cancelled. ______________________________________________________________________________________________

## 2018-09-11 NOTE — Progress Notes (Signed)
Nursing Pain Medication Assessment:  Safety precautions to be maintained throughout the outpatient stay will include: orient to surroundings, keep bed in low position, maintain call bell within reach at all times, provide assistance with transfer out of bed and ambulation.  Medication Inspection Compliance: [name] did not comply with our request to bring his pills to be counted. He was reminded that bringing the medication bottles, even when empty, is a requirement.  Medication: None brought in. Pill/Patch Count: None available to be counted. Bottle Appearance: No container available. Did not bring bottle(s) to appointment. Filled Date: N/A Last Medication intake:  Today   Pt states he took last pill today and did not think he needed to bring bottle. Instructed pt he is to bring pill bottle to each visit.

## 2018-09-11 NOTE — Patient Instructions (Addendum)
____________________________________________________________________________________________  Medication Rules  Purpose: To inform patients, and their family members, of our rules and regulations.  Applies to: All patients receiving prescriptions (written or electronic).  Pharmacy of record: Pharmacy where electronic prescriptions will be sent. If written prescriptions are taken to a different pharmacy, please inform the nursing staff. The pharmacy listed in the electronic medical record should be the one where you would like electronic prescriptions to be sent.  Electronic prescriptions: In compliance with the [place] Strengthen Opioid Misuse Prevention (STOP) Act of 2017 (Session Law 2017-74/H243), effective October 04, 2018, all controlled substances must be electronically prescribed. Calling prescriptions to the pharmacy will cease to exist.  Prescription refills: Only during scheduled appointments. Applies to all prescriptions.  NOTE: The following applies primarily to controlled substances (Opioid* Pain Medications).   Patient's responsibilities: 1. Pain Pills: Bring all pain pills to every appointment (except for procedure appointments). 2. Pill Bottles: Bring pills in original pharmacy bottle. Always bring the newest bottle. Bring bottle, even if empty. 3. Medication refills: You are responsible for knowing and keeping track of what medications you take and those you need refilled. The day before your appointment: write a list of all prescriptions that need to be refilled. The day of the appointment: give the list to the admitting nurse. Prescriptions will be written only during appointments. If you forget a medication: it will not be "Called in", "Faxed", or "electronically sent". You will need to get another appointment to get these prescribed. No early refills. Do not call asking to have your prescription filled early. 4. Prescription Accuracy: You are responsible for  carefully inspecting your prescriptions before leaving our office. Have the discharge nurse carefully go over each prescription with you, before taking them home. Make sure that your name is accurately spelled, that your address is correct. Check the name and dose of your medication to make sure it is accurate. Check the number of pills, and the written instructions to make sure they are clear and accurate. Make sure that you are given enough medication to last until your next medication refill appointment. 5. Taking Medication: Take medication as prescribed. When it comes to controlled substances, taking less pills or less frequently than prescribed is permitted and encouraged. Never take more pills than instructed. Never take medication more frequently than prescribed.  6. Inform other Doctors: Always inform, all of your healthcare providers, of all the medications you take. 7. Pain Medication from other Providers: You are not allowed to accept any additional pain medication from any other Doctor or Healthcare provider. There are two exceptions to this rule. (see below) In the event that you require additional pain medication, you are responsible for notifying us, as stated below. 8. Medication Agreement: You are responsible for carefully reading and following our Medication Agreement. This must be signed before receiving any prescriptions from our practice. Safely store a copy of your signed Agreement. Violations to the Agreement will result in no further prescriptions. (Additional copies of our Medication Agreement are available upon request.) 9. Laws, Rules, & Regulations: All patients are expected to follow all Federal and State Laws, Statutes, Rules, & Regulations. Ignorance of the Laws does not constitute a valid excuse. The use of any illegal substances is prohibited. 10. Adopted CDC guidelines & recommendations: Target dosing levels will be at or below 60 MME/day. Use of benzodiazepines** is not  recommended.  Exceptions: There are only two exceptions to the rule of not receiving pain medications from other Healthcare Providers. 1.   Exception #1 (Emergencies): In the event of an emergency (i.e.: accident requiring emergency care), you are allowed to receive additional pain medication. However, you are responsible for: As soon as you are able, call our office [phone], at any time of the day or night, and leave a message stating your name, the date and nature of the emergency, and the name and dose of the medication prescribed. In the event that your call is answered by a member of our staff, make sure to document and save the date, time, and the name of the person that took your information.  2. Exception #2 (Planned Surgery): In the event that you are scheduled by another doctor or dentist to have any type of surgery or procedure, you are allowed (for a period no longer than 30 days), to receive additional pain medication, for the acute post-op pain. However, in this case, you are responsible for picking up a copy of our "Post-op Pain Management for Surgeons" handout, and giving it to your surgeon or dentist. This document is available at our office, and does not require an appointment to obtain it. Simply go to our office during business hours (Monday-Thursday from 8:00 AM to 4:00 PM) (Friday 8:00 AM to 12:00 Noon) or if you have a scheduled appointment with [place], prior to your surgery, and ask for it by name. In addition, you will need to provide [place] with your name, name of your surgeon, type of surgery, and date of procedure or surgery.  *Opioid medications include: morphine, codeine, oxycodone, oxymorphone, hydrocodone, hydromorphone, meperidine, tramadol, tapentadol, buprenorphine, fentanyl, methadone. **Benzodiazepine medications include: diazepam (Valium), alprazolam (Xanax), clonazepam (Klonopine), lorazepam (Ativan), clorazepate (Tranxene), chlordiazepoxide (Librium), estazolam (Prosom),  oxazepam (Serax), temazepam (Restoril), triazolam (Halcion) (Last updated: 12/01/2017) ____________________________________________________________________________________________    ______________________________________________________________________________________________  Specialty Pain Scale  Introduction:  There are significant differences in how pain is reported. The word pain usually refers to physical pain, but it is also a common synonym of suffering. The medical community uses a scale from 0 (zero) to 10 (ten) to report pain level. Zero (0) is described as "no pain", while ten (10) is described as "the worse pain you can imagine". The problem with this scale is that physical pain is reported along with suffering. Suffering refers to mental pain, or more often yet it refers to any unpleasant feeling, emotion or aversion associated with the perception of harm or threat of harm. It is the psychological component of pain.  Pain Specialists prefer to separate the two components. The pain scale used by this practice is the Verbal Numerical Rating Scale (VNRS-11). This scale is for the physical pain only. DO NOT INCLUDE how your pain psychologically affects you. This scale is for adults 75 years of age and older. It has 11 (eleven) levels. The 1st level is 0/10. This means: "right now, I have no pain". In the context of pain management, it also means: "right now, my physical pain is under control with the current therapy".  General Information:  The scale should reflect your current level of pain. Unless you are specifically asked for the level of your worst pain, or your average pain. If you are asked for one of these two, then it should be understood that it is over the past 24 hours.  Levels 1 (one) through 5 (five) are described below, and can be treated as an outpatient. Ambulatory pain management facilities such as ours are more than adequate to treat these levels. Levels 6 (six) through  10 (ten) are also described below, however, these must be treated as a hospitalized patient. While levels 6 (six) and 7 (seven) may be evaluated at an urgent care facility, levels 8 (eight) through 10 (ten) constitute medical emergencies and as such, they belong in a hospital's emergency department. When having these levels (as described below), do not come to our office. Our facility is not equipped to manage these levels. Go directly to an urgent care facility or an emergency department to be evaluated.  Definitions:  Activities of Daily Living (ADL): Activities of daily living (ADL or ADLs) is a term used in healthcare to refer to people's daily self-care activities. Health professionals often use a person's ability or inability to perform ADLs as a measurement of their functional status, particularly in regard to people post injury, with disabilities and the elderly. There are two ADL levels: Basic and Instrumental. Basic Activities of Daily Living (BADL  or BADLs) consist of self-care tasks that include: Bathing and showering; personal hygiene and grooming (including brushing/combing/styling hair); dressing; Toilet hygiene (getting to the toilet, cleaning oneself, and getting back up); eating and self-feeding (not including cooking or chewing and swallowing); functional mobility, often referred to as "transferring", as measured by the ability to walk, get in and out of bed, and get into and out of a chair; the broader definition (moving from one place to another while performing activities) is useful for people with different physical abilities who are still able to get around independently. Basic ADLs include the things many people do when they get up in the morning and get ready to go out of the house: get out of bed, go to the toilet, bathe, dress, groom, and eat. On the average, loss of function typically follows a particular order. Hygiene is the first to go, followed by loss of toilet use and  locomotion. The last to go is the ability to eat. When there is only one remaining area in which the person is independent, there is a 62.9% chance that it is eating and only a 3.5% chance that it is hygiene. Instrumental Activities of Daily Living (IADL or IADLs) are not necessary for fundamental functioning, but they let an individual live independently in a community. IADL consist of tasks that include: cleaning and maintaining the house; home establishment and maintenance; care of others (including selecting and supervising caregivers); care of pets; child rearing; managing money; managing financials (investments, etc.); meal preparation and cleanup; shopping for groceries and necessities; moving within the community; safety procedures and emergency responses; health management and maintenance (taking prescribed medications); and using the telephone or other form of communication.  Instructions:  Most patients tend to report their pain as a combination of two factors, their physical pain and their psychosocial pain. This last one is also known as "suffering" and it is reflection of how physical pain affects you socially and psychologically. From now on, report them separately.  From this point on, when asked to report your pain level, report only your physical pain. Use the following table for reference.  Pain Clinic Pain Levels (0-5/10)  Pain Level Score  Description  No Pain 0   Mild pain 1 Nagging, annoying, but does not interfere with basic activities of daily living (ADL). Patients are able to eat, bathe, get dressed, toileting (being able to get on and off the toilet and perform personal hygiene functions), transfer (move in and out of bed or a chair without assistance), and maintain continence (able to control bladder  and bowel functions). Blood pressure and heart rate are unaffected. A normal heart rate for a healthy adult ranges from 60 to 100 bpm (beats per minute).   Mild to moderate pain  2 Noticeable and distracting. Impossible to hide from other people. More frequent flare-ups. Still possible to adapt and function close to normal. It can be very annoying and may have occasional stronger flare-ups. With discipline, patients may get used to it and adapt.   Moderate pain 3 Interferes significantly with activities of daily living (ADL). It becomes difficult to feed, bathe, get dressed, get on and off the toilet or to perform personal hygiene functions. Difficult to get in and out of bed or a chair without assistance. Very distracting. With effort, it can be ignored when deeply involved in activities.   Moderately severe pain 4 Impossible to ignore for more than a few minutes. With effort, patients may still be able to manage work or participate in some social activities. Very difficult to concentrate. Signs of autonomic nervous system discharge are evident: dilated pupils (mydriasis); mild sweating (diaphoresis); sleep interference. Heart rate becomes elevated (>115 bpm). Diastolic blood pressure (lower number) rises above 100 mmHg. Patients find relief in laying down and not moving.   Severe pain 5 Intense and extremely unpleasant. Associated with frowning face and frequent crying. Pain overwhelms the senses.  Ability to do any activity or maintain social relationships becomes significantly limited. Conversation becomes difficult. Pacing back and forth is common, as getting into a comfortable position is nearly impossible. Pain wakes you up from deep sleep. Physical signs will be obvious: pupillary dilation; increased sweating; goosebumps; brisk reflexes; cold, clammy hands and feet; nausea, vomiting or dry heaves; loss of appetite; significant sleep disturbance with inability to fall asleep or to remain asleep. When persistent, significant weight loss is observed due to the complete loss of appetite and sleep deprivation.  Blood pressure and heart rate becomes significantly elevated. Caution:  If elevated blood pressure triggers a pounding headache associated with blurred vision, then the patient should immediately seek attention at an urgent or emergency care unit, as these may be signs of an impending stroke.    Emergency Department Pain Levels (6-10/10)  Emergency Room Pain 6 Severely limiting. Requires emergency care and should not be seen or managed at an outpatient pain management facility. Communication becomes difficult and requires great effort. Assistance to reach the emergency department may be required. Facial flushing and profuse sweating along with potentially dangerous increases in heart rate and blood pressure will be evident.   Distressing pain 7 Self-care is very difficult. Assistance is required to transport, or use restroom. Assistance to reach the emergency department will be required. Tasks requiring coordination, such as bathing and getting dressed become very difficult.   Disabling pain 8 Self-care is no longer possible. At this level, pain is disabling. The individual is unable to do even the most "basic" activities such as walking, eating, bathing, dressing, transferring to a bed, or toileting. Fine motor skills are lost. It is difficult to think clearly.   Incapacitating pain 9 Pain becomes incapacitating. Thought processing is no longer possible. Difficult to remember your own name. Control of movement and coordination are lost.   The worst pain imaginable 10 At this level, most patients pass out from pain. When this level is reached, collapse of the autonomic nervous system occurs, leading to a sudden drop in blood pressure and heart rate. This in turn results in a temporary and dramatic drop in  blood flow to the brain, leading to a loss of consciousness. Fainting is one of the body's self defense mechanisms. Passing out puts the brain in a calmed state and causes it to shut down for a while, in order to begin the healing process.    Summary: 1. Refer to this  scale when providing us with your pain level. 2. Be accurate and careful when reporting your pain level. This will help with your care. 3. Over-reporting your pain level will lead to loss of credibility. 4. Even a level of 1/10 means that there is pain and will be treated at our facility. 5. High, inaccurate reporting will be documented as "Symptom Exaggeration", leading to loss of credibility and suspicions of possible secondary gains such as obtaining more narcotics, or wanting to appear disabled, for fraudulent reasons. 6. Only pain levels of 5 or below will be seen at our facility. 7. Pain levels of 6 and above will be sent to the Emergency Department and the appointment cancelled. ______________________________________________________________________________________________

## 2018-09-13 ENCOUNTER — Encounter: Payer: Medicare HMO | Admitting: Nurse Practitioner

## 2019-03-09 ENCOUNTER — Encounter: Payer: Self-pay | Admitting: Pain Medicine

## 2019-03-11 NOTE — Patient Instructions (Signed)
____________________________________________________________________________________________  Medication Rules  Purpose: To inform patients, and their family members, of our rules and regulations.  Applies to: All patients receiving prescriptions (written or electronic).  Pharmacy of record: Pharmacy where electronic prescriptions will be sent. If written prescriptions are taken to a different pharmacy, please inform the nursing staff. The pharmacy listed in the electronic medical record should be the one where you would like electronic prescriptions to be sent.  Electronic prescriptions: In compliance with the [place] Strengthen Opioid Misuse Prevention (STOP) Act of 2017 (Session Law 2017-74/H243), effective October 04, 2018, all controlled substances must be electronically prescribed. Calling prescriptions to the pharmacy will cease to exist.  Prescription refills: Only during scheduled appointments. Applies to all prescriptions.  NOTE: The following applies primarily to controlled substances (Opioid* Pain Medications).   Patient's responsibilities: 1. Pain Pills: Bring all pain pills to every appointment (except for procedure appointments). 2. Pill Bottles: Bring pills in original pharmacy bottle. Always bring the newest bottle. Bring bottle, even if empty. 3. Medication refills: You are responsible for knowing and keeping track of what medications you take and those you need refilled. The day before your appointment: write a list of all prescriptions that need to be refilled. The day of the appointment: give the list to the admitting nurse. Prescriptions will be written only during appointments. No prescriptions will be written on procedure days. If you forget a medication: it will not be "Called in", "Faxed", or "electronically sent". You will need to get another appointment to get these prescribed. No early refills. Do not call asking to have your prescription filled  early. 4. Prescription Accuracy: You are responsible for carefully inspecting your prescriptions before leaving our office. Have the discharge nurse carefully go over each prescription with you, before taking them home. Make sure that your name is accurately spelled, that your address is correct. Check the name and dose of your medication to make sure it is accurate. Check the number of pills, and the written instructions to make sure they are clear and accurate. Make sure that you are given enough medication to last until your next medication refill appointment. 5. Taking Medication: Take medication as prescribed. When it comes to controlled substances, taking less pills or less frequently than prescribed is permitted and encouraged. Never take more pills than instructed. Never take medication more frequently than prescribed.  6. Inform other Doctors: Always inform, all of your healthcare providers, of all the medications you take. 7. Pain Medication from other Providers: You are not allowed to accept any additional pain medication from any other Doctor or Healthcare provider. There are two exceptions to this rule. (see below) In the event that you require additional pain medication, you are responsible for notifying us, as stated below. 8. Medication Agreement: You are responsible for carefully reading and following our Medication Agreement. This must be signed before receiving any prescriptions from our practice. Safely store a copy of your signed Agreement. Violations to the Agreement will result in no further prescriptions. (Additional copies of our Medication Agreement are available upon request.) 9. Laws, Rules, & Regulations: All patients are expected to follow all Federal and State Laws, Statutes, Rules, & Regulations. Ignorance of the Laws does not constitute a valid excuse. The use of any illegal substances is prohibited. 10. Adopted CDC guidelines & recommendations: Target dosing levels will be  at or below 60 MME/day. Use of benzodiazepines** is not recommended.  Exceptions: There are only two exceptions to the rule of not   receiving pain medications from other Healthcare Providers. 1. Exception #1 (Emergencies): In the event of an emergency (i.e.: accident requiring emergency care), you are allowed to receive additional pain medication. However, you are responsible for: As soon as you are able, call our office [phone], at any time of the day or night, and leave a message stating your name, the date and nature of the emergency, and the name and dose of the medication prescribed. In the event that your call is answered by a member of our staff, make sure to document and save the date, time, and the name of the person that took your information.  2. Exception #2 (Planned Surgery): In the event that you are scheduled by another doctor or dentist to have any type of surgery or procedure, you are allowed (for a period no longer than 30 days), to receive additional pain medication, for the acute post-op pain. However, in this case, you are responsible for picking up a copy of our "Post-op Pain Management for Surgeons" handout, and giving it to your surgeon or dentist. This document is available at our office, and does not require an appointment to obtain it. Simply go to our office during business hours (Monday-Thursday from 8:00 AM to 4:00 PM) (Friday 8:00 AM to 12:00 Noon) or if you have a scheduled appointment with us, prior to your surgery, and ask for it by name. In addition, you will need to provide us with your name, name of your surgeon, type of surgery, and date of procedure or surgery.  *Opioid medications include: morphine, codeine, oxycodone, oxymorphone, hydrocodone, hydromorphone, meperidine, tramadol, tapentadol, buprenorphine, fentanyl, methadone. **Benzodiazepine medications include: diazepam (Valium), alprazolam (Xanax), clonazepam (Klonopine), lorazepam (Ativan), clorazepate  (Tranxene), chlordiazepoxide (Librium), estazolam (Prosom), oxazepam (Serax), temazepam (Restoril), triazolam (Halcion) (Last updated: 12/01/2017) ____________________________________________________________________________________________   ____________________________________________________________________________________________  Medication Recommendations and Reminders  Applies to: All patients receiving prescriptions (written and/or electronic).  Medication Rules & Regulations: These rules and regulations exist for your safety and that of others. They are not flexible and neither are we. Dismissing or ignoring them will be considered "non-compliance" with medication therapy, resulting in complete and irreversible termination of such therapy. (See document titled "Medication Rules" for more details.) In all conscience, because of safety reasons, we cannot continue providing a therapy where the patient does not follow instructions.  Pharmacy of record:   Definition: This is the pharmacy where your electronic prescriptions will be sent.   We do not endorse any particular pharmacy.  You are not restricted in your choice of pharmacy.  The pharmacy listed in the electronic medical record should be the one where you want electronic prescriptions to be sent.  If you choose to change pharmacy, simply notify our nursing staff of your choice of new pharmacy.  Recommendations:  Keep all of your pain medications in a safe place, under lock and key, even if you live alone.   After you fill your prescription, take 1 week's worth of pills and put them away in a safe place. You should keep a separate, properly labeled bottle for this purpose. The remainder should be kept in the original bottle. Use this as your primary supply, until it runs out. Once it's gone, then you know that you have 1 week's worth of medicine, and it is time to come in for a prescription refill. If you do this correctly, it  is unlikely that you will ever run out of medicine.  To make sure that the above recommendation works,   it is very important that you make sure your medication refill appointments are scheduled at least 1 week before you run out of medicine. To do this in an effective manner, make sure that you do not leave the office without scheduling your next medication management appointment. Always ask the nursing staff to show you in your prescription , when your medication will be running out. Then arrange for the receptionist to get you a return appointment, at least 7 days before you run out of medicine. Do not wait until you have 1 or 2 pills left, to come in. This is very poor planning and does not take into consideration that we may need to cancel appointments due to bad weather, sickness, or emergencies affecting our staff.  "Partial Fill": If for any reason your pharmacy does not have enough pills/tablets to completely fill or refill your prescription, do not allow for a "partial fill". You will need a separate prescription to fill the remaining amount, which we will not provide. If the reason for the partial fill is your insurance, you will need to talk to the pharmacist about payment alternatives for the remaining tablets, but again, do not accept a partial fill.  Prescription refills and/or changes in medication(s):   Prescription refills, and/or changes in dose or medication, will be conducted only during scheduled medication management appointments. (Applies to both, written and electronic prescriptions.)  No refills on procedure days. No medication will be changed or started on procedure days. No changes, adjustments, and/or refills will be conducted on a procedure day. Doing so will interfere with the diagnostic portion of the procedure.  No phone refills. No medications will be "called into the pharmacy".  No Fax refills.  No weekend refills.  No Holliday refills.  No after hours  refills.  Remember:  Business hours are:  Monday to Thursday 8:00 AM to 4:00 PM Provider's Schedule: Crystal King, NP - Appointments are:  Medication management: Monday to Thursday 8:00 AM to 4:00 PM Antonia Jicha, MD - Appointments are:  Medication management: Monday and Wednesday 8:00 AM to 4:00 PM Procedure day: Tuesday and Thursday 7:30 AM to 4:00 PM Bilal Lateef, MD - Appointments are:  Medication management: Tuesday and Thursday 8:00 AM to 4:00 PM Procedure day: Monday and Wednesday 7:30 AM to 4:00 PM (Last update: 12/01/2017) ____________________________________________________________________________________________   ____________________________________________________________________________________________  CANNABIDIOL (AKA: CBD Oil or Pills)  Applies to: All patients receiving prescriptions of controlled substances (written and/or electronic).  General Information: Cannabidiol (CBD) was discovered in 1940. It is one of some 113 identified cannabinoids in cannabis (Marijuana) plants, accounting for up to 40% of the plant's extract. As of 2018, preliminary clinical research on cannabidiol included studies of anxiety, cognition, movement disorders, and pain.  Cannabidiol is consummed in multiple ways, including inhalation of cannabis smoke or vapor, as an aerosol spray into the cheek, and by mouth. It may be supplied as CBD oil containing CBD as the active ingredient (no added tetrahydrocannabinol (THC) or terpenes), a full-plant CBD-dominant hemp extract oil, capsules, dried cannabis, or as a liquid solution. CBD is thought not have the same psychoactivity as THC, and may affect the actions of THC. Studies suggest that CBD may interact with different biological targets, including cannabinoid receptors and other neurotransmitter receptors. As of 2018 the mechanism of action for its biological effects has not been determined.  In the [place], cannabidiol has a limited  approval by the Food and Drug Administration (FDA) for treatment of only two types   of epilepsy disorders. The side effects of long-term use of the drug include somnolence, decreased appetite, diarrhea, fatigue, malaise, weakness, sleeping problems, and others.  CBD remains a Schedule I drug prohibited for any use.  Legality: Some manufacturers ship CBD products nationally, an illegal action which the FDA has not enforced in 2018, with CBD remaining the subject of an FDA investigational new drug evaluation, and is not considered legal as a dietary supplement or food ingredient as of December 2018. Federal illegality has made it difficult historically to conduct research on CBD. CBD is openly sold in head shops and health food stores in some states where such sales have not been explicitly legalized.  Warning: Because it is not FDA approved for general use or treatment of pain, it is not required to undergo the same manufacturing controls as prescription drugs.  This means that the available cannabidiol (CBD) may be contaminated with THC.  If this is the case, it will trigger a positive urine drug screen (UDS) test for cannabinoids (Marijuana).  Because a positive UDS for illicit substances is a violation of our medication agreement, your opioid analgesics (pain medicine) may be permanently discontinued. (Last update: 12/22/2017) ____________________________________________________________________________________________    

## 2019-03-11 NOTE — Progress Notes (Signed)
Pain Management Virtual Encounter Note - Virtual Visit via Telephone Telehealth (real-time audio visits between healthcare provider and patient).   Patient's Phone No. & Preferred Pharmacy:  [phone] (home); [phone] (mobile); (Preferred) [phone] No e-mail address on record  [place] [place] [address] [place] [address] Phone: [phone] Fax: [phone]    Pre-screening note:  Our staff contacted Caleb Edwards and offered him an "in person", "face-to-face" appointment versus a telephone encounter. He indicated preferring the telephone encounter, at this time.   Reason for Virtual Visit: COVID-19*  Social distancing based on CDC and AMA recommendations.   I contacted Caleb Edwards on 03/12/2019 at 10:10 AM via telephone.      I clearly identified myself as Gaspar Cola, MD. I verified that I was speaking with the correct person using two identifiers (Name: Caleb Edwards, and date of birth: 1951/03/28).  Advanced Informed Consent I sought verbal advanced consent from Caleb Edwards for virtual visit interactions. I informed Caleb Edwards of possible security and privacy concerns, risks, and limitations associated with providing "not-in-person" medical evaluation and management services. I also informed Caleb Edwards of the availability of "in-person" appointments. Finally, I informed him that there would be a charge for the virtual visit and that he could be  personally, fully or partially, financially responsible for it. Caleb Edwards expressed understanding and agreed to proceed.   Historic Elements   Caleb Edwards is a 68 y.o. year old, male patient evaluated today after his last encounter by our practice on Visit date not found. Caleb Edwards  has a past medical history of Back pain, Blood transfusion without reported diagnosis (12/2015), Diverticulitis, Hypertension, and Ulcer, stomach  peptic, chronic. He also  has a past surgical history that includes Esophagogastroduodenoscopy (egd) with propofol (N/A, 01/03/2016); Colonoscopy with propofol (N/A, 08/02/2017); Esophagogastroduodenoscopy (egd) with propofol (08/02/2017); Esophagogastroduodenoscopy (egd) with propofol (N/A, 02/05/2018); Colonoscopy with propofol (N/A, 02/05/2018); and Esophagogastroduodenoscopy (egd) with propofol (N/A, 03/30/2018). Caleb Edwards has a current medication list which includes the following prescription(s): amlodipine, atorvastatin, chlorthalidone, ferrous sulfate, lactulose, lisinopril, pantoprazole, polyethylene glycol, sildenafil, and tramadol. He  reports that he has quit smoking. He has never used smokeless tobacco. He reports that he does not drink alcohol or use drugs. Caleb Edwards has No Known Allergies.   HPI  Today, he is being contacted for medication management.  I was able to finally get this patient on the third attempt.  He indicates having no problems with the medications.  Pharmacotherapy Assessment  Analgesic: Tramadol 50 mg 2 tablets every 6 hours (400 mg/day) MME/day:40 mg/day  Monitoring: Pharmacotherapy: No side-effects or adverse reactions reported. [place] PMP: PDMP reviewed during this encounter.       Compliance: No problems identified. Effectiveness: Clinically acceptable. Plan: Refer to "POC".  Pertinent Labs   SAFETY SCREENING Profile Lab Results  Component Value Date   HIV Non Reactive 08/01/2017   Renal Function Lab Results  Component Value Date   BUN 12 02/06/2018   CREATININE 1.18 02/06/2018   GFRAA >60 02/06/2018   GFRNONAA >60 02/06/2018   Hepatic Function Lab Results  Component Value Date   AST 21 02/03/2018   ALT 16 (L) 02/03/2018   ALBUMIN 3.8 02/03/2018   UDS Summary  Date Value Ref Range Status  03/24/2017 FINAL  Final    Comment:    ==================================================================== TOXASSURE SELECT 13  (MW) ==================================================================== Test  Result       Flag       Units Drug Present   Tramadol                       PRESENT   O-Desmethyltramadol            PRESENT   N-Desmethyltramadol            PRESENT    Source of tramadol is a prescription medication.    O-desmethyltramadol and N-desmethyltramadol are expected    metabolites of tramadol. ==================================================================== Test                      Result    Flag   Units      Ref Range   Creatinine              125              mg/dL      >=78>=20 ==================================================================== Declared Medications:  Medication list was not provided. ==================================================================== For clinical consultation, please call [phone]. ====================================================================    Note: Above Lab results reviewed.  Recent imaging  DG C-Arm 1-60 Min-No Report Fluoroscopy was utilized by the requesting physician.  No radiographic  interpretation.   Assessment  The primary encounter diagnosis was Chronic pain syndrome. 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  Plan of Care  I have changed Caleb Edwards's traMADol. I am also having him maintain his pantoprazole, chlorthalidone, amLODipine, atorvastatin, lisinopril, ferrous sulfate, sildenafil, polyethylene glycol, and lactulose.  Pharmacotherapy (Medications Ordered): Meds ordered this encounter  Medications  . traMADol (ULTRAM) 50 MG tablet    Sig: Take 2 tablets (100 mg total) by mouth every 6 (six)  hours as needed for severe pain. Each refill must last 30 days.    Dispense:  240 tablet    Refill:  5    Chronic Pain: STOP Act - Not applicable. Fill 1 day early if closed on scheduled refill date. Do not fill until: 03/12/2019. To last until: 09/08/2019. Instruct to avoid benzodiazepines within 8 hours of opioid.   Orders:  No orders of the defined types were placed in this encounter.  Follow-up plan:   Return in about 6 months (around 09/05/2019) for (Virtual), E/M, (Med-Mgmt).    I discussed the assessment and treatment plan with the patient. The patient was provided an opportunity to ask questions and all were answered. The patient agreed with the plan and demonstrated an understanding of the instructions.  Patient advised to call back or seek an in-person evaluation if the symptoms or condition worsens.  Total duration of non-face-to-face encounter: 12 minutes.  Note by: Caleb DoneFrancisco A Darnisha Vernet, MD Date: 03/12/2019; Time: 10:30 AM  Note: This dictation was prepared with Dragon dictation. Any transcriptional errors that may result from this process are unintentional.  Disclaimer:  * Given the special circumstances of the COVID-19 pandemic, the federal government has announced that the Office for Civil Rights (OCR) will exercise its enforcement discretion and will not impose penalties on physicians using telehealth in the event of noncompliance with regulatory requirements under the [employer] Insurance Portability and Accountability Act (HIPAA) in connection with the good faith provision of telehealth during the  XX123456 [employer]. ([place])

## 2019-03-12 ENCOUNTER — Other Ambulatory Visit: Payer: Self-pay

## 2019-03-12 ENCOUNTER — Ambulatory Visit: Payer: Medicare HMO | Attending: Nurse Practitioner | Admitting: Pain Medicine

## 2019-03-12 DIAGNOSIS — M4310 Spondylolisthesis, site unspecified: Secondary | ICD-10-CM

## 2019-03-12 DIAGNOSIS — G8929 Other chronic pain: Secondary | ICD-10-CM

## 2019-03-12 DIAGNOSIS — M79605 Pain in left leg: Secondary | ICD-10-CM

## 2019-03-12 DIAGNOSIS — M47816 Spondylosis without myelopathy or radiculopathy, lumbar region: Secondary | ICD-10-CM | POA: Diagnosis not present

## 2019-03-12 DIAGNOSIS — G894 Chronic pain syndrome: Secondary | ICD-10-CM

## 2019-03-12 DIAGNOSIS — M5137 Other intervertebral disc degeneration, lumbosacral region: Secondary | ICD-10-CM

## 2019-03-12 DIAGNOSIS — M5416 Radiculopathy, lumbar region: Secondary | ICD-10-CM

## 2019-03-12 DIAGNOSIS — M5442 Lumbago with sciatica, left side: Secondary | ICD-10-CM

## 2019-03-12 MED ORDER — TRAMADOL HCL 50 MG PO TABS: 100.0000 mg | ORAL_TABLET | Freq: Four times a day (QID) | ORAL | 5 refills | Status: DC | PRN

## 2019-03-13 ENCOUNTER — Encounter: Payer: Medicare HMO | Admitting: Nurse Practitioner

## 2019-05-09 ENCOUNTER — Telehealth: Payer: Self-pay

## 2019-05-09 NOTE — Telephone Encounter (Signed)
Called patient to verify his pain status and what procedure was needed. No answer, Left message on answering machinge to call us.

## 2019-05-09 NOTE — Telephone Encounter (Signed)
Spoke with patient. Does not have a procedure scheduled or prn. Has not been assessed since 03/12/19 for virtual med refill. Needs an eval appt with Dr. Dossie Arbour to assess and order procedure if he wishes. Call sent to [facility] to schedule.

## 2019-05-09 NOTE — Telephone Encounter (Signed)
The patient left me a voicemail wanting to know why he hasn't been called about authorization for his procedure, but as far as I can see, no procedure order was entered at this last visit in June. He has a lesi order from 2019, so is this the procedure I should get authorized?

## 2019-05-31 ENCOUNTER — Encounter: Payer: Self-pay | Admitting: Pain Medicine

## 2019-06-04 ENCOUNTER — Ambulatory Visit: Payer: Medicare HMO | Attending: Pain Medicine | Admitting: Pain Medicine

## 2019-06-04 ENCOUNTER — Other Ambulatory Visit: Payer: Self-pay

## 2019-06-04 DIAGNOSIS — M5442 Lumbago with sciatica, left side: Secondary | ICD-10-CM

## 2019-06-04 DIAGNOSIS — M899 Disorder of bone, unspecified: Secondary | ICD-10-CM

## 2019-06-04 DIAGNOSIS — G894 Chronic pain syndrome: Secondary | ICD-10-CM | POA: Diagnosis not present

## 2019-06-04 DIAGNOSIS — Z79899 Other long term (current) drug therapy: Secondary | ICD-10-CM

## 2019-06-04 DIAGNOSIS — G8929 Other chronic pain: Secondary | ICD-10-CM

## 2019-06-04 DIAGNOSIS — M48061 Spinal stenosis, lumbar region without neurogenic claudication: Secondary | ICD-10-CM

## 2019-06-04 DIAGNOSIS — Z789 Other specified health status: Secondary | ICD-10-CM

## 2019-06-04 DIAGNOSIS — M25551 Pain in right hip: Secondary | ICD-10-CM | POA: Diagnosis not present

## 2019-06-04 DIAGNOSIS — M5416 Radiculopathy, lumbar region: Secondary | ICD-10-CM

## 2019-06-04 DIAGNOSIS — M79605 Pain in left leg: Secondary | ICD-10-CM

## 2019-06-04 DIAGNOSIS — M47816 Spondylosis without myelopathy or radiculopathy, lumbar region: Secondary | ICD-10-CM

## 2019-06-04 DIAGNOSIS — M4310 Spondylolisthesis, site unspecified: Secondary | ICD-10-CM

## 2019-06-04 DIAGNOSIS — E559 Vitamin D deficiency, unspecified: Secondary | ICD-10-CM

## 2019-06-04 NOTE — Patient Instructions (Addendum)

## 2019-06-04 NOTE — Progress Notes (Signed)
Pain Management Virtual Encounter Note - Virtual Visit via Telephone Telehealth (real-time audio visits between healthcare provider and patient).   Patient's Phone No. & Preferred Pharmacy:  [phone] (home); [phone] (mobile); (Preferred) [phone] No e-mail address on record  [facility] [address] ([address]), [place] - [address]) [place] [address] Phone: [phone] Fax: [phone]    Pre-screening note:  Our staff contacted Caleb Edwards and offered him an "in person", "face-to-face" appointment versus a telephone encounter. He indicated preferring the telephone encounter, at this time.   Reason for Virtual Visit: COVID-19*  Social distancing based on CDC and AMA recommendations.   I contacted Caleb Edwards on 06/04/2019 via telephone.      I clearly identified myself as Oswaldo Done, MD. I verified that I was speaking with the correct person using two identifiers (Name: Caleb Edwards, and date of birth: [birth year]).  Advanced Informed Consent I sought verbal advanced consent from Caleb Edwards for virtual visit interactions. I informed Caleb Edwards of possible security and privacy concerns, risks, and limitations associated with providing "not-in-person" medical evaluation and management services. I also informed Caleb Edwards of the availability of "in-person" appointments. Finally, I informed him that there would be a charge for the virtual visit and that he could be  personally, fully or partially, financially responsible for it. Caleb Edwards expressed understanding and agreed to proceed.   Historic Elements   Caleb Edwards is a 68 y.o. year old, male patient evaluated today after his last encounter by our practice on 05/09/2019. Caleb Edwards  has a past medical history of Back pain, Blood transfusion without reported diagnosis (12/2015), Diverticulitis, Hypertension, and Ulcer, stomach peptic, chronic. He also   has a past surgical history that includes Esophagogastroduodenoscopy (egd) with propofol (N/A, 01/03/2016); Colonoscopy with propofol (N/A, 08/02/2017); Esophagogastroduodenoscopy (egd) with propofol (08/02/2017); Esophagogastroduodenoscopy (egd) with propofol (N/A, 02/05/2018); Colonoscopy with propofol (N/A, 02/05/2018); and Esophagogastroduodenoscopy (egd) with propofol (N/A, 03/30/2018). Caleb Edwards has a current medication list which includes the following prescription(s): amlodipine, atorvastatin, chlorthalidone, ferrous sulfate, lactulose, lisinopril, pantoprazole, polyethylene glycol, sildenafil, and tramadol. He  reports that he has quit smoking. He has never used smokeless tobacco. He reports that he does not drink alcohol or use drugs. Caleb Edwards has No Known Allergies.   HPI  Today, he is being contacted for worsening of previously known (established) problem.   Pharmacotherapy Assessment  Analgesic: Tramadol 50 mg, 2 tabs PO q 6 hrs (400 mg/day of tramadol) (should have enough until 09/08/2019) MME/day:40 mg/day.   Monitoring: Pharmacotherapy: No side-effects or adverse reactions reported. [place] PMP: PDMP reviewed during this encounter.       Compliance: No problems identified. Effectiveness: Clinically acceptable. Plan: Refer to "POC".  UDS:  Summary  Date Value Ref Range Status  03/24/2017 FINAL  Final    Comment:    ==================================================================== TOXASSURE SELECT 13 (MW) ==================================================================== Test                             Result       Flag       Units Drug Present   Tramadol                       PRESENT   O-Desmethyltramadol            PRESENT   N-Desmethyltramadol  PRESENT    Source of tramadol is a prescription medication.    O-desmethyltramadol and N-desmethyltramadol are expected    metabolites of  tramadol. ==================================================================== Test                      Result    Flag   Units      Ref Range   Creatinine              125              mg/dL      >=16>=20 ==================================================================== Declared Medications:  Medication list was not provided. ==================================================================== For clinical consultation, please call [phone]. ====================================================================    Laboratory Chemistry Profile (12 mo)  Renal: No results found for requested labs within last 8760 hours.  Lab Results  Component Value Date   GFRAA >60 02/06/2018   GFRNONAA >60 02/06/2018   Hepatic: No results found for requested labs within last 8760 hours. Lab Results  Component Value Date   AST 21 02/03/2018   ALT 16 (L) 02/03/2018   Other: No results found for requested labs within last 8760 hours. Note: Above Lab results reviewed.  Imaging  Last 90 days:  No results found.  Assessment  The primary encounter diagnosis was Chronic pain syndrome. 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  Plan of Care  I am having Caleb Edwards maintain his pantoprazole, chlorthalidone, amLODipine, atorvastatin, lisinopril, ferrous sulfate, sildenafil, polyethylene glycol, lactulose, and traMADol.  Pharmacotherapy (Medications Ordered): No orders of the defined types were placed in this encounter.  Orders:  Orders  Placed This Encounter  Procedures  . Lumbar Epidural Injection    TIMEFRAME: PRN procedure. (Caleb Edwards will call when needed.)    Standing Status:   Standing    Number of Occurrences:   6    Standing Expiration Date:   06/03/2020    Scheduling Instructions:     Purpose: Palliative     Indication: Lower extremity pain/Sciatica left (M54.32).     Side: Left-sided     Level: L3-4     Sedation: Patient's choice    Order Specific Question:   Where will this procedure be performed?    Answer:   [facility]  . Lumbar Transforaminal Epidural    TIMEFRAME: PRN procedure. (Caleb Edwards will call when needed.)    Standing Status:   Standing    Number of Occurrences:   6    Standing Expiration Date:   06/03/2020    Scheduling Instructions:     Purpose: Palliative     Indication: Lower extremity pain. Radiculopathy/Radiculitis lumbar (M54.16).     Side: Right     Level: L3, L4     Sedation: Patient's choice    Order Specific Question:   Where will this procedure be performed?    Answer:   [facility]  . HIP INJECTION    Standing Status:   Future    Standing Expiration Date:   07/04/2019    Scheduling Instructions:     Side: Right-sided     Sedation: With Sedation.  Timeframe: As soon as schedule allows  . DG Lumbar Spine Complete W/Bend    In addition to any acute findings, please report on degenerative changes related to: (Please specify level(s)) (1) ROM & instability (>814mm displacement) (2) Facet joint (Zygoapophyseal Joint) (3) DDD and/or IVDD (4) Pars defects (5) Previous surgical changes (Include description of hardware and hardware status, if present) (6) Presence and degree of spondylolisthesis, spondylosis, and/or spondyloarthropathies)  (7) Old Fractures (8) Demineralization (9) Additional bone pathology (10) Stenosis (Central, Lateral Recess, Foraminal) (11) If at all possible, please provide AP diameter (mm) of foraminal and/or central canal.     Standing Status:   Future    Standing Expiration Date:   09/03/2019    Order Specific Question:   Reason for Exam (SYMPTOM  OR DIAGNOSIS REQUIRED)    Answer:   Low back pain    Order Specific Question:   Preferred imaging location?    Answer:   [facility]    Order Specific Question:   Call Results- Best Contact Number?    Answer:   [phone] [facility])    Order Specific Question:   Radiology Contrast Protocol - do NOT remove file path    Answer:   [email or web address]  . DG HIP UNILAT W OR W/O PELVIS 2-3 VIEWS RIGHT    Standing Status:   Future    Standing Expiration Date:   06/03/2020    Scheduling Instructions:     Please describe any evidence of DJD, such as joint narrowing, asymmetry, cysts, or any anomalies in bone density, production, or erosion.    Order Specific Question:   Reason for Exam (SYMPTOM  OR DIAGNOSIS REQUIRED)    Answer:   Right hip pain/arthralgia    Order Specific Question:   Preferred imaging location?    Answer:   [facility]    Order Specific Question:   Call Results- Best Contact Number?    Answer:   [phone] (Pain Clinic facility) (Dr. Laban EmperorNaveira)  . ToxASSURE Select 13 (MW), Urine    Volume: 30 ml(s). Minimum 3 ml of urine is needed. Document temperature of fresh sample. Indications: Long term (current) use of opiate analgesic (Z79.891)  . Comp. Metabolic Panel (12)    With GFR. Indications: Chronic Pain Syndrome (G89.4) & Pharmacotherapy (H08.657(Z79.899)    Order Specific Question:   Has the patient fasted?    Answer:   No    Order Specific Question:   CC Results    Answer:   PCP-NURSE [[id number]]  . Magnesium    Indication: Pharmacologic therapy (Q46.962(Z79.899)    Order Specific Question:   CC Results    Answer:   PCP-NURSE [[id number]][[id number]]  . Vitamin B12    Indication: Pharmacologic therapy (L24.401(Z79.899).    Order Specific Question:   CC Results    Answer:   PCP-NURSE [[id number]]  . Sedimentation rate     Indication: Disorder of skeletal system (M89.9)    Order Specific Question:   CC Results    Answer:   PCP-NURSE [[id number]][[id number]]  . 25-Hydroxyvitamin D Lcms D2+D3    Indication: Disorder of skeletal system (M89.9).    Order Specific Question:   CC Results    Answer:   PCP-NURSE [[id number]]  . C-reactive protein    Indication: Problems influencing health status (Z78.9)    Order Specific Question:   CC Results    Answer:   PCP-NURSE [[id number]][[id number]]   Follow-up plan:   Return for Procedure (w/ Sedation): (R) IA Hip inj. vs (  R) L3 & L4 TFESI, (ASAP).      Interventional management options:  Considering:   Diagnostic bilateral lumbar facet block  Possible bilateral lumbar facet RFA    Palliative PRN treatment(s):   Palliative left-sided L3-4 LESI #3  Palliative left-sided L4 TFESI #2  Palliative right-sided L4 TFESI #2     Recent Visits Date Type Provider Dept  03/12/19 Office Visit Milinda Pointer, MD [facility]  Showing recent visits within past 90 days and meeting all other requirements   Today's Visits Date Type Provider Dept  06/04/19 Office Visit Milinda Pointer, MD [facility]  Showing today's visits and meeting all other requirements   Future Appointments Date Type Provider Dept  08/27/19 Appointment Milinda Pointer, MD [facility]  Showing future appointments within next 90 days and meeting all other requirements   I discussed the assessment and treatment plan with the patient. The patient was provided an opportunity to ask questions and all were answered. The patient agreed with the plan and demonstrated an understanding of the instructions.  Patient advised to call back or seek an in-person evaluation if the symptoms or condition worsens.  Total duration of non-face-to-face encounter: 13 minutes.  Note by: Gaspar Cola, MD Date: 06/04/2019; Time: 3:53 PM  Note: This dictation was prepared with Dragon dictation. Any transcriptional  errors that may result from this process are unintentional.  Disclaimer:  * Given the special circumstances of the COVID-19 pandemic, the federal government has announced that the Office for Civil Rights (OCR) will exercise its enforcement discretion and will not impose penalties on physicians using telehealth in the event of noncompliance with regulatory requirements under the [place] (HIPAA) in connection with the good faith provision of telehealth during the QQPYP-95 national public health emergency. ([place])

## 2019-06-06 ENCOUNTER — Telehealth: Payer: Self-pay | Admitting: *Deleted

## 2019-06-21 ENCOUNTER — Ambulatory Visit
Admission: RE | Admit: 2019-06-21 | Discharge: 2019-06-21 | Disposition: A | Discharge status: Home / Self Care | Payer: Medicare HMO | Source: Ambulatory Visit | Attending: Pain Medicine | Admitting: Pain Medicine

## 2019-06-21 ENCOUNTER — Other Ambulatory Visit: Payer: Self-pay

## 2019-06-21 ENCOUNTER — Ambulatory Visit: Payer: Medicare HMO | Admitting: Pain Medicine

## 2019-06-21 ENCOUNTER — Ambulatory Visit (HOSPITAL_BASED_OUTPATIENT_CLINIC_OR_DEPARTMENT_OTHER): Payer: Medicare HMO | Admitting: Pain Medicine

## 2019-06-21 VITALS — BP 142/75 | HR 96 | Temp 98.7°F | Resp 16 | Ht 73.0 in | Wt 180.0 lb

## 2019-06-21 DIAGNOSIS — M48061 Spinal stenosis, lumbar region without neurogenic claudication: Secondary | ICD-10-CM

## 2019-06-21 DIAGNOSIS — M47816 Spondylosis without myelopathy or radiculopathy, lumbar region: Secondary | ICD-10-CM | POA: Diagnosis present

## 2019-06-21 DIAGNOSIS — M25551 Pain in right hip: Secondary | ICD-10-CM | POA: Insufficient documentation

## 2019-06-21 DIAGNOSIS — M5416 Radiculopathy, lumbar region: Secondary | ICD-10-CM | POA: Insufficient documentation

## 2019-06-21 DIAGNOSIS — G8929 Other chronic pain: Secondary | ICD-10-CM | POA: Insufficient documentation

## 2019-06-21 DIAGNOSIS — M5442 Lumbago with sciatica, left side: Secondary | ICD-10-CM | POA: Insufficient documentation

## 2019-06-21 DIAGNOSIS — M79605 Pain in left leg: Secondary | ICD-10-CM

## 2019-06-21 DIAGNOSIS — M5137 Other intervertebral disc degeneration, lumbosacral region: Secondary | ICD-10-CM

## 2019-06-21 MED ORDER — SODIUM CHLORIDE (PF) 0.9 % IJ SOLN
INTRAMUSCULAR | Status: AC
  Filled 2019-06-21: qty 10

## 2019-06-21 MED ORDER — IOHEXOL 180 MG/ML  SOLN
10.0000 mL | Freq: Once | INTRAMUSCULAR | Status: AC
  Administered 2019-06-21: 10 mL via EPIDURAL

## 2019-06-21 MED ORDER — ROPIVACAINE HCL 2 MG/ML IJ SOLN
2.0000 mL | Freq: Once | INTRAMUSCULAR | Status: AC
  Administered 2019-06-21: 09:00:00 2 mL via EPIDURAL
  Filled 2019-06-21: qty 10

## 2019-06-21 MED ORDER — FENTANYL CITRATE (PF) 100 MCG/2ML IJ SOLN: 25.0000 ug | INTRAMUSCULAR | Status: DC | PRN

## 2019-06-21 MED ORDER — LIDOCAINE HCL 2 % IJ SOLN
20.0000 mL | Freq: Once | INTRAMUSCULAR | Status: DC
  Filled 2019-06-21: qty 20

## 2019-06-21 MED ORDER — MIDAZOLAM HCL 5 MG/5ML IJ SOLN: 1.0000 mg | INTRAMUSCULAR | Status: DC | PRN

## 2019-06-21 MED ORDER — SODIUM CHLORIDE 0.9% FLUSH
2.0000 mL | Freq: Once | INTRAVENOUS | Status: AC
  Administered 2019-06-21: 2 mL

## 2019-06-21 MED ORDER — LACTATED RINGERS IV SOLN: 1000.0000 mL | Freq: Once | INTRAVENOUS | Status: DC

## 2019-06-21 MED ORDER — DEXAMETHASONE SODIUM PHOSPHATE 10 MG/ML IJ SOLN
INTRAMUSCULAR | Status: AC
  Filled 2019-06-21: qty 1

## 2019-06-21 MED ORDER — DEXAMETHASONE SODIUM PHOSPHATE 10 MG/ML IJ SOLN
20.0000 mg | Freq: Once | INTRAMUSCULAR | Status: AC
  Administered 2019-06-21: 20 mg
  Filled 2019-06-21: qty 2

## 2019-06-21 NOTE — Progress Notes (Signed)
Patient's Name: Caleb Edwards  MRN: [id number]  Referring Provider: Delano Metz, MD  DOB: 12-18-1950  PCP: [facility] [employer]  DOS: 06/21/2019  Note by: Oswaldo Done, MD  Service setting: Ambulatory outpatient  Specialty: Interventional Pain Management  Patient type: Established  Location: [facility] (AMB) Pain Management Facility  Visit type: Interventional Procedure   Primary Reason for Visit: Interventional Pain Management Treatment. CC: Hip Pain (right)  Procedure:          Anesthesia, Analgesia, Anxiolysis:  Type: Trans-Foraminal Epidural Steroid Injection #2  Purpose: Diagnostic/Therapeutic Region: Posterolateral Lumbosacral Target Area: The 6 o'clock position under the pedicle, on the affected side. Approach: Posterior Percutaneous Paravertebral approach. Level: L3 & L4 Levels Laterality: Right-Sided Paravertebral  Type: Local Anesthesia Indication(s): Analgesia         Route: Infiltration (/IM) IV Access: Declined Sedation: Declined  Local Anesthetic: Lidocaine 1-2%  Position: Prone   Indications: 1. DDD (degenerative disc disease), lumbosacral   2. 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 Lumbar spondylosis   4. Chronic low back pain (Primary Area of Pain) (Bilateral) (R>L)   5. Chronic hip pain (Right)    Pain Score: Pre-procedure: 8 /10 Post-procedure: 0-No pain/10   Pre-op Assessment:  Mr. Varcoe is a 68 y.o. (year old), male patient, seen today for interventional treatment. He  has a past surgical history that includes Esophagogastroduodenoscopy (egd) with propofol (N/A, 01/03/2016); Colonoscopy with propofol (N/A, 08/02/2017); Esophagogastroduodenoscopy (egd) with propofol (08/02/2017); Esophagogastroduodenoscopy (egd) with propofol (N/A, 02/05/2018); Colonoscopy with propofol (N/A, 02/05/2018); and Esophagogastroduodenoscopy (egd) with propofol (N/A, 03/30/2018). Mr. Cannada has a current medication list which includes the following  prescription(s): amlodipine, atorvastatin, chlorthalidone, ferrous sulfate, lactulose, lisinopril, pantoprazole, polyethylene glycol, sildenafil, and tramadol, and the following Facility-Administered Medications: lidocaine. His primarily concern today is the Hip Pain (right)  Initial Vital Signs:  Pulse/HCG Rate: 94  Temp: 98.7 F (37.1 C) Resp: 16 BP: 138/80 SpO2: 100 %  BMI: Estimated body mass index is 23.75 kg/m as calculated from the following:   Height as of this encounter: 6\' 1"  (1.854 m).   Weight as of this encounter: 180 lb (81.6 kg).  Risk Assessment: Allergies: Reviewed. He has No Known Allergies.  Allergy Precautions: None required Coagulopathies: Reviewed. None identified.  Blood-thinner therapy: None at this time Active Infection(s): Reviewed. None identified. Mr. Zwicker is afebrile  Site Confirmation: Mr. Derksen was asked to confirm the procedure and laterality before marking the site Procedure checklist: Completed Consent: Before the procedure and under the influence of no sedative(s), amnesic(s), or anxiolytics, the patient was informed of the treatment options, risks and possible complications. To fulfill our ethical and legal obligations, as recommended by the American Medical Association's Code of Ethics, I have informed the patient of my clinical impression; the nature and purpose of the treatment or procedure; the risks, benefits, and possible complications of the intervention; the alternatives, including doing nothing; the risk(s) and benefit(s) of the alternative treatment(s) or procedure(s); and the risk(s) and benefit(s) of doing nothing. The patient was provided information about the general risks and possible complications associated with the procedure. These may include, but are not limited to: failure to achieve desired goals, infection, bleeding, organ or nerve damage, allergic reactions, paralysis, and death. In addition, the patient was informed of those  risks and complications associated to Spine-related procedures, such as failure to decrease pain; infection (i.e.: Meningitis, epidural or intraspinal abscess); bleeding (i.e.: epidural hematoma, subarachnoid hemorrhage, or any other type of intraspinal  or peri-dural bleeding); organ or nerve damage (i.e.: Any type of peripheral nerve, nerve root, or spinal cord injury) with subsequent damage to sensory, motor, and/or autonomic systems, resulting in permanent pain, numbness, and/or weakness of one or several areas of the body; allergic reactions; (i.e.: anaphylactic reaction); and/or death. Furthermore, the patient was informed of those risks and complications associated with the medications. These include, but are not limited to: allergic reactions (i.e.: anaphylactic or anaphylactoid reaction(s)); adrenal axis suppression; blood sugar elevation that in diabetics may result in ketoacidosis or comma; water retention that in patients with history of congestive heart failure may result in shortness of breath, pulmonary edema, and decompensation with resultant heart failure; weight gain; swelling or edema; medication-induced neural toxicity; particulate matter embolism and blood vessel occlusion with resultant organ, and/or nervous system infarction; and/or aseptic necrosis of one or more joints. Finally, the patient was informed that Medicine is not an exact science; therefore, there is also the possibility of unforeseen or unpredictable risks and/or possible complications that may result in a catastrophic outcome. The patient indicated having understood very clearly. We have given the patient no guarantees and we have made no promises. Enough time was given to the patient to ask questions, all of which were answered to the patient's satisfaction. [name] has indicated that he wanted to continue with the procedure. Attestation: I, the ordering provider, attest that I have discussed with the patient the benefits,  risks, side-effects, alternatives, likelihood of achieving goals, and potential problems during recovery for the procedure that I have provided informed consent. Date  Time: 06/21/2019  8:54 AM  Pre-Procedure Preparation:  Monitoring: As per clinic protocol. Respiration, ETCO2, SpO2, BP, heart rate and rhythm monitor placed and checked for adequate function Safety Precautions: Patient was assessed for positional comfort and pressure points before starting the procedure. Time-out: I initiated and conducted the "Time-out" before starting the procedure, as per protocol. The patient was asked to participate by confirming the accuracy of the "Time Out" information. Verification of the correct person, site, and procedure were performed and confirmed by me, the nursing staff, and the patient. "Time-out" conducted as per Joint Commission's Universal Protocol (UP.01.01.01). Time: 0940  Description of Procedure:          Area Prepped: Entire Posterior Lumbosacral Area Prepping solution: DuraPrep (Iodine Povacrylex [0.7% available iodine] and Isopropyl Alcohol, 74% w/w) Safety Precautions: Aspiration looking for blood return was conducted prior to all injections. At no point did we inject any substances, as a needle was being advanced. No attempts were made at seeking any paresthesias. Safe injection practices and needle disposal techniques used. Medications properly checked for expiration dates. SDV (single dose vial) medications used. Description of the Procedure: Protocol guidelines were followed. The patient was placed in position over the procedure table. The target area was identified and the area prepped in the usual manner. Skin & deeper tissues infiltrated with local anesthetic. Appropriate amount of time allowed to pass for local anesthetics to take effect. The procedure needles were then advanced to the target area. Proper needle placement secured. Negative aspiration confirmed. Solution injected in  intermittent fashion, asking for systemic symptoms every 0.5cc of injectate. The needles were then removed and the area cleansed, making sure to leave some of the prepping solution back to take advantage of its long term bactericidal properties.  Vitals:   06/21/19 0930 06/21/19 0940 06/21/19 0950 06/21/19 1000  BP: (!) 142/83 139/90 132/75 (!) 142/75  Pulse: 99 95 (!) 103 96  Resp: 15 18 14 16   Temp:      TempSrc:      SpO2: 100% 100% 100% 99%  Weight:      Height:        Start Time: 0940 hrs. End Time: 0956 hrs.  Materials:  Needle(s) Type: Spinal Needle Gauge: 22G Length: 5-in Medication(s): Please see orders for medications and dosing details.  Imaging Guidance (Spinal):          Type of Imaging Technique: Fluoroscopy Guidance (Spinal) Indication(s): Assistance in needle guidance and placement for procedures requiring needle placement in or near specific anatomical locations not easily accessible without such assistance. Exposure Time: Please see nurses notes. Contrast: Before injecting any contrast, we confirmed that the patient did not have an allergy to iodine, shellfish, or radiological contrast. Once satisfactory needle placement was completed at the desired level, radiological contrast was injected. Contrast injected under live fluoroscopy. No contrast complications. See chart for type and volume of contrast used. Fluoroscopic Guidance: I was personally present during the use of fluoroscopy. "Tunnel Vision Technique" used to obtain the best possible view of the target area. Parallax error corrected before commencing the procedure. "Direction-depth-direction" technique used to introduce the needle under continuous pulsed fluoroscopy. Once target was reached, antero-posterior, oblique, and lateral fluoroscopic projection used confirm needle placement in all planes. Images permanently stored in EMR. Interpretation: I personally interpreted the imaging intraoperatively. Adequate  needle placement confirmed in multiple planes. Appropriate spread of contrast into desired area was observed. No evidence of afferent or efferent intravascular uptake. No intrathecal or subarachnoid spread observed. Permanent images saved into the patient's record.  Antibiotic Prophylaxis:   Anti-infectives (From admission, onward)   None     Indication(s): None identified  Post-operative Assessment:  Post-procedure Vital Signs:  Pulse/HCG Rate: 96  Temp: 98.7 F (37.1 C) Resp: 16 BP: (!) 142/75 SpO2: 99 %  EBL: None  Complications: No immediate post-treatment complications observed by team, or reported by patient.  Note: The patient tolerated the entire procedure well. A repeat set of vitals were taken after the procedure and the patient was kept under observation following institutional policy, for this type of procedure. Post-procedural neurological assessment was performed, showing return to baseline, prior to discharge. The patient was provided with post-procedure discharge instructions, including a section on how to identify potential problems. Should any problems arise concerning this procedure, the patient was given instructions to immediately contact us, at any time, without hesitation. In any case, we plan to contact the patient by telephone for a follow-up status report regarding this interventional procedure.  Comments:  No additional relevant information.  Plan of Care  Orders:  Orders Placed This Encounter  Procedures  . Lumbar Transforaminal Epidural    Scheduling Instructions:     Side: Right-sided     Level: L3 & L4     Sedation: With Sedation.     Timeframe: Today    Order Specific Question:   Where will this procedure be performed?    Answer:   [facility]  . DG PAIN [facility] C-ARM 1-60 MIN NO REPORT    Intraoperative interpretation by procedural physician at [place].    Standing Status:   Standing    Number of Occurrences:   1     Order Specific Question:   Reason for exam:    Answer:   Assistance in needle guidance and placement for procedures requiring needle placement in or near specific anatomical locations not easily accessible without  such assistance.  . Informed Consent Details: Physician/Practitioner Attestation; Transcribe to consent form and obtain patient signature    Surgeon: Aceton Kinnear A. Laban EmperorNaveira, MD    Scheduling Instructions:     Procedure: Diagnostic lumbar transforaminal epidural steroid injection under fluoroscopic guidance. (See notes for level and laterality.)     Indications: Lumbar radiculopathy/radiculitis associated with lumbar stenosis   Chronic Opioid Analgesic:  Tramadol 50 mg, 2 tabs PO q 6 hrs (400 mg/day of tramadol) (should have enough until 09/08/2019) MME/day:40 mg/day.   Medications ordered for procedure: Meds ordered this encounter  Medications  . iohexol (OMNIPAQUE) 180 MG/ML injection 10 mL    Must be Myelogram-compatible. If not available, you may substitute with a water-soluble, non-ionic, hypoallergenic, myelogram-compatible radiological contrast medium.  Marland Kitchen. lidocaine (XYLOCAINE) 2 % (with pres) injection 400 mg  . DISCONTD: lactated ringers infusion 1,000 mL  . DISCONTD: midazolam (VERSED) 5 MG/5ML injection 1-2 mg    Make sure Flumazenil is available in the pyxis when using this medication. If oversedation occurs, administer 0.2 mg IV over 15 sec. If after 45 sec no response, administer 0.2 mg again over 1 min; may repeat at 1 min intervals; not to exceed 4 doses (1 mg)  . DISCONTD: fentaNYL (SUBLIMAZE) injection 25-50 mcg    Make sure Narcan is available in the pyxis when using this medication. In the event of respiratory depression (RR< 8/min): Titrate NARCAN (naloxone) in increments of 0.1 to 0.2 mg IV at 2-3 minute intervals, until desired degree of reversal.  . sodium chloride flush (NS) 0.9 % injection 2 mL  . ropivacaine (PF) 2 mg/mL (0.2%) (NAROPIN) injection 2 mL  .  dexamethasone (DECADRON) injection 20 mg   Medications administered: We administered iohexol, sodium chloride flush, ropivacaine (PF) 2 mg/mL (0.2%), and dexamethasone.  See the medical record for exact dosing, route, and time of administration.  Follow-up plan:   Return in about 2 weeks (around 07/05/2019) for (VV), (PP).       Interventional management options:  Considering:   Diagnostic right IA hip joint injection  Diagnostic bilateral lumbar facet block  Possible bilateral lumbar facet RFA    Palliative PRN treatment(s):   Palliative left L3-4 LESI #3  Palliative left L4 TFESI #2  Palliative right L3 TFESI #2  Palliative right L4 TFESI #3     Recent Visits Date Type Provider Dept  06/04/19 Office Visit Delano MetzNaveira, Matheu Ploeger, MD [facility]  Showing recent visits within past 90 days and meeting all other requirements   Today's Visits Date Type Provider Dept  06/21/19 Procedure visit Delano MetzNaveira, Sreenidhi Ganson, MD [facility]  Showing today's visits and meeting all other requirements   Future Appointments Date Type Provider Dept  07/11/19 Appointment Delano MetzNaveira, Ezell Melikian, MD [facility]  08/27/19 Appointment Delano MetzNaveira, Jahdai Padovano, MD [facility]  Showing future appointments within next 90 days and meeting all other requirements   Disposition: Discharge home  Discharge Date & Time: 06/21/2019; 1005 hrs.   [facility] Note by: Oswaldo DoneFrancisco A Moriya Mitchell, MD Date: 06/21/2019; Time: 10:20 AM  Disclaimer:  Medicine is not an exact science. The only guarantee in medicine is that nothing is guaranteed. It is important to note that the decision to proceed with this intervention was based on the information collected from the patient. The Data and conclusions were drawn from the patient's questionnaire, the interview, and the physical examination. Because the information  was provided in large  part by the patient, it cannot be guaranteed that it has not been purposely or unconsciously manipulated. Every effort has been made to obtain as much relevant data as possible for this evaluation. It is important to note that the conclusions that lead to this procedure are derived in large part from the available data. Always take into account that the treatment will also be dependent on availability of resources and existing treatment guidelines, considered by other Pain Management Practitioners as being common knowledge and practice, at the time of the intervention. For Medico-Legal purposes, it is also important to point out that variation in procedural techniques and pharmacological choices are the acceptable norm. The indications, contraindications, technique, and results of the above procedure should only be interpreted and judged by a Board-Certified Interventional Pain Specialist with extensive familiarity and expertise in the same exact procedure and technique.

## 2019-06-21 NOTE — Patient Instructions (Signed)

## 2019-06-22 ENCOUNTER — Telehealth: Payer: Self-pay

## 2019-06-22 NOTE — Telephone Encounter (Signed)
Denies any needs at this time. States he is doing well and would like to get calls on his cell phone as primary.

## 2019-07-10 ENCOUNTER — Encounter: Payer: Self-pay | Admitting: Pain Medicine

## 2019-07-11 ENCOUNTER — Other Ambulatory Visit: Payer: Self-pay

## 2019-07-11 ENCOUNTER — Ambulatory Visit: Payer: Medicare HMO | Attending: Pain Medicine | Admitting: Pain Medicine

## 2019-07-11 DIAGNOSIS — M47816 Spondylosis without myelopathy or radiculopathy, lumbar region: Secondary | ICD-10-CM

## 2019-07-11 DIAGNOSIS — M4310 Spondylolisthesis, site unspecified: Secondary | ICD-10-CM

## 2019-07-11 DIAGNOSIS — G894 Chronic pain syndrome: Secondary | ICD-10-CM

## 2019-07-11 DIAGNOSIS — M5442 Lumbago with sciatica, left side: Secondary | ICD-10-CM | POA: Diagnosis not present

## 2019-07-11 DIAGNOSIS — M5126 Other intervertebral disc displacement, lumbar region: Secondary | ICD-10-CM

## 2019-07-11 DIAGNOSIS — G8929 Other chronic pain: Secondary | ICD-10-CM

## 2019-07-11 DIAGNOSIS — M79605 Pain in left leg: Secondary | ICD-10-CM

## 2019-07-11 DIAGNOSIS — M5136 Other intervertebral disc degeneration, lumbar region: Secondary | ICD-10-CM

## 2019-07-11 DIAGNOSIS — M5137 Other intervertebral disc degeneration, lumbosacral region: Secondary | ICD-10-CM

## 2019-07-11 NOTE — Progress Notes (Signed)
Pain Management Virtual Encounter Note - Virtual Visit via Telephone Telehealth (real-time audio visits between healthcare provider and patient).   Patient's Phone No. & Preferred Pharmacy:  [phone] (home); [phone] (mobile); (Preferred) [phone] No e-mail address on record  [facility] [address] [place] [address] Phone: [phone] Fax: [phone]    Pre-screening note:  Our staff contacted Caleb Edwards and offered him an "in person", "face-to-face" appointment versus a telephone encounter. He indicated preferring the telephone encounter, at this time.   Reason for Virtual Visit: COVID-19*  Social distancing based on CDC and AMA recommendations.   I contacted Caleb Edwards on 07/11/2019 via telephone.      I clearly identified myself as Gaspar Cola, MD. I verified that I was speaking with the correct person using two identifiers (Name: Caleb Edwards, and date of birth: September 26, 1951).  Advanced Informed Consent I sought verbal advanced consent from Caleb Edwards for virtual visit interactions. I informed Caleb Edwards of possible security and privacy concerns, risks, and limitations associated with providing "not-in-person" medical evaluation and management services. I also informed Caleb Edwards of the availability of "in-person" appointments. Finally, I informed him that there would be a charge for the virtual visit and that he could be  personally, fully or partially, financially responsible for it. Caleb Edwards expressed understanding and agreed to proceed.   Historic Elements   Caleb Edwards is a 68 y.o. year old, male patient evaluated today after his last encounter by our practice on 06/22/2019. Caleb Edwards  has a past medical history of Back pain, Blood transfusion without reported diagnosis (12/2015), Diverticulitis, Hypertension, and Ulcer, stomach peptic, chronic. He also   has a past surgical history that includes Esophagogastroduodenoscopy (egd) with propofol (N/A, 01/03/2016); Colonoscopy with propofol (N/A, 08/02/2017); Esophagogastroduodenoscopy (egd) with propofol (08/02/2017); Esophagogastroduodenoscopy (egd) with propofol (N/A, 02/05/2018); Colonoscopy with propofol (N/A, 02/05/2018); and Esophagogastroduodenoscopy (egd) with propofol (N/A, 03/30/2018). Caleb Edwards has a current medication list which includes the following prescription(s): amlodipine, atorvastatin, chlorthalidone, ferrous sulfate, lactulose, lisinopril, pantoprazole, polyethylene glycol, sildenafil, and tramadol. He  reports that he has quit smoking. He has never used smokeless tobacco. He reports that he does not drink alcohol or use drugs. Caleb Edwards.   HPI  Today, he is being contacted for a post-procedure assessment.  The patient indicates that the pain is back to square one.  However, he is not having any pain below the buttocks area.  He does have pain in the buttocks and in the lower back, primarily on the right side.  He also indicated that his wife told him that he had a "bruise" in the back, but it is not in the area where we did injections.  I asked him if he had any kind of trauma that could have caused that and he indicated that he could not remember any.  We also went over his medications and he is not taking any type of blood thinner.  On 2017 we did some x-rays of his lower back and the results showed a "stable" anterolisthesis of L5 on S1.  However it was read to be of 7 mm, increasing to 9 mm in flexion and remaining  At 7 mm in extension.  In addition it showed degenerative disc disease at L2-3 and L5-S1.  Since his symptoms have not improved and in fact they seem to be getting worse, today I  will go ahead and put an order to have those x-rays repeated and if he continues to have similar pathology, or it seems to have advanced, we will go ahead and order a lumbar MRI to  follow that.   Because his primary pain is in the lower back and the radicular component to his pain seems to be minimal, it is very likely that this pain is coming from the facet joints, primarily those at the L5-S1 level where the anterolisthesis is present.  Because of this, I have suggested that we need to consider the possibility of doing a diagnostic bilateral lumbar facet block and if positive for lumbar facet syndrome, then consider radiofrequency ablation.  He understood and accepted.  During today's visit, we spent quite a bit of time going over the results of the diagnostic/therapeutic injections as well as his current symptoms and possible causes and alternatives.  We reviewed in detail all of his medications including over-the-counter medications, looking for possible culprits to the bruising.  After the stenting I took time to explain to him the plan, starting with the x-rays, following up with the MRI, and possible alternatives depending on whether the problem is coming from the inner portion of the spine versus the outer anatomical structures.  Post-Procedure Evaluation  Procedure: Diagnostic/therapeutic right-sided L3 and L4 TFESI #2 under fluoroscopic guidance, no sedation Pre-procedure pain level:  8/10 Post-procedure: 0/10 (100% relief)  Sedation: None.  Effectiveness during initial hour after procedure(Ultra-Short Term Relief): 100 %   Local anesthetic used: Long-acting (4-6 hours) Effectiveness: Defined as any analgesic benefit obtained secondary to the administration of local anesthetics. This carries significant diagnostic value as to the etiological location, or anatomical origin, of the pain. Duration of benefit is expected to coincide with the duration of the local anesthetic used.  Effectiveness during initial 4-6 hours after procedure(Short-Term Relief): 100 %   Long-term benefit: Defined as any relief past the pharmacologic duration of the local anesthetics.   Effectiveness past the initial 6 hours after procedure(Long-Term Relief): 100 %(pain relief lasted approx 3 days.  pain returned suddenly to the same severity as before procedure.)   Current benefits: Defined as benefit that persist at this time.   Analgesia:  Back to baseline Function: Back to baseline ROM: Back to baseline  Pharmacotherapy Assessment  Analgesic: Tramadol 50 mg, 2 tabs PO q 6 hrs (400 mg/day of tramadol) (should have enough until 09/08/2019) MME/day:40 mg/day.   Monitoring: Pharmacotherapy: No side-effects or adverse reactions reported. [place] PMP: PDMP reviewed during this encounter.       Compliance: No problems identified. Effectiveness: Clinically acceptable. Plan: Refer to "POC".  UDS:  Summary  Date Value Ref Range Status  03/24/2017 FINAL  Final    Comment:    ==================================================================== TOXASSURE SELECT 13 (MW) ==================================================================== Test                             Result       Flag       Units Drug Present   Tramadol                       PRESENT   O-Desmethyltramadol            PRESENT   N-Desmethyltramadol            PRESENT    Source of tramadol is a prescription medication.    O-desmethyltramadol and N-desmethyltramadol are expected  metabolites of tramadol. ==================================================================== Test                      Result    Flag   Units      Ref Range   Creatinine              125              mg/dL      >=59>=20 ==================================================================== Declared Medications:  Medication list was not provided. ==================================================================== For clinical consultation, please call [phone]. ====================================================================    Laboratory Chemistry Profile (12 mo)  Renal: No results found for requested labs within last  8760 hours.  Lab Results  Component Value Date   GFRAA >60 02/06/2018   GFRNONAA >60 02/06/2018   Hepatic: No results found for requested labs within last 8760 hours. Lab Results  Component Value Date   AST 21 02/03/2018   ALT 16 (L) 02/03/2018   Other: No results found for requested labs within last 8760 hours. Note: Above Lab results reviewed.  Imaging  Last 90 days:  Dg [facility] Clinic C-arm 1-60 Min No Report  Result Date: 06/21/2019 Fluoro was used, but no Radiologist interpretation will be provided. Please refer to "NOTES" tab for provider progress note.   Assessment  The primary encounter diagnosis was Chronic pain syndrome. Diagnoses of Chronic low back pain (Primary Area of Pain) (Bilateral) (R>L), Chronic lower extremity pain (Secondary area of Pain) (Left), Grade 1 Anterolisthesis (7-219mm) of L5 over S1, Lumbar facet arthropathy, Lumbar facet syndrome (Bilateral) (R>L), DDD (degenerative disc disease), lumbosacral, Other intervertebral disc degeneration, lumbar region, and L4-5 disc bulge were also pertinent to this visit.  Plan of Care  I am having Caleb Edwards maintain his pantoprazole, chlorthalidone, amLODipine, atorvastatin, lisinopril, ferrous sulfate, sildenafil, polyethylene glycol, lactulose, and traMADol.  Pharmacotherapy (Medications Ordered): No orders of the defined types were placed in this encounter.  Orders:  Orders Placed This Encounter  Procedures  . DG Lumbar Spine Complete W/Bend    In addition to any acute findings, please report on degenerative changes related to: (Please specify level(s)) (1) ROM & instability (>624mm displacement) (2) Facet joint (Zygoapophyseal Joint) (3) DDD and/or IVDD (4) Pars defects (5) Previous surgical changes (Include description of hardware and hardware status, if present) (6) Presence and degree of spondylolisthesis, spondylosis, and/or spondyloarthropathies)  (7) Old Fractures (8) Demineralization (9) Additional  bone pathology (10) Stenosis (Central, Lateral Recess, Foraminal) (11) If at all possible, please provide AP diameter (mm) of foraminal and/or central canal.    Standing Status:   Future    Standing Expiration Date:   10/11/2019    Order Specific Question:   Reason for Exam (SYMPTOM  OR DIAGNOSIS REQUIRED)    Answer:   Low back pain    Order Specific Question:   Preferred imaging location?    Answer:   [facility]    Order Specific Question:   Call Results- Best Contact Number?    Answer:   [phone] [facility])    Order Specific Question:   Radiology Contrast Protocol - do NOT remove file path    Answer:   \\[email or web address]  . MR LUMBAR SPINE WO CONTRAST    In addition to any acute findings, please report on degenerative changes related to: (Please specify level(s)) (1) ROM & instability (>664mm displacement) (2) Facet joint (Zygoapophyseal Joint) (3) DDD and/or IVDD (4) Pars defects (5) Previous surgical changes (Include description of hardware and hardware  status, if present) (6) Presence and degree of spondylolisthesis, spondylosis, and/or spondyloarthropathies)  (7) Old Fractures (8) Demineralization (9) Additional bone pathology (10) Stenosis (Central, Lateral Recess, Foraminal) (11) If at all possible, please provide AP diameter (mm) of foraminal and/or central canal.    Standing Status:   Future    Standing Expiration Date:   10/11/2019    Order Specific Question:   What is the patient's sedation requirement?    Answer:   No Sedation    Order Specific Question:   Does the patient have a pacemaker or implanted devices?    Answer:   No    Order Specific Question:   Preferred imaging location?    Answer:   [facility] (table limit-350lbs)    Order Specific Question:   Call Results- Best Contact Number?    Answer:   [phone] [facility])    Order Specific Question:   Radiology Contrast Protocol - do NOT  remove file path    Answer:   \\[email or web address]   Follow-up plan:   Return for (VV), E/M after imaging studies to go over the results and develop a treatment plan.     Interventional management options:  Considering:   Diagnostic right IA hip joint injection  Diagnostic bilateral lumbar facet block  Possible bilateral lumbar facet RFA    Palliative PRN treatment(s):   Palliative left L3-4 LESI #3  Palliative left L4 TFESI #2  Palliative right L3 TFESI #2  Palliative right L4 TFESI #3     Recent Visits Date Type Provider Dept  06/21/19 Procedure visit Delano Metz, MD [facility]  06/04/19 Office Visit Delano Metz, MD [facility]  Showing recent visits within past 90 days and meeting all other requirements   Today's Visits Date Type Provider Dept  07/11/19 Office Visit Delano Metz, MD [facility]  Showing today's visits and meeting all other requirements   Future Appointments Date Type Provider Dept  08/27/19 Appointment Delano Metz, MD [facility]  Showing future appointments within next 90 days and meeting all other requirements   I discussed the assessment and treatment plan with the patient. The patient was provided an opportunity to ask questions and all were answered. The patient agreed with the plan and demonstrated an understanding of the instructions.  Patient advised to call back or seek an in-person evaluation if the symptoms or condition worsens.  Total duration of non-face-to-face encounter: 25 minutes.  Note by: Oswaldo Done, MD Date: 07/11/2019; Time: 3:10 PM  Note: This dictation was prepared with Dragon dictation. Any transcriptional errors that may result from this process are unintentional.  Disclaimer:  * Given the special circumstances of the COVID-19 pandemic, the federal government has announced that the Office for Civil Rights (OCR) will exercise its  enforcement discretion and will not impose penalties on physicians using telehealth in the event of noncompliance with regulatory requirements under the [employer] Portability and Accountability Act (HIPAA) in connection with the good faith provision of telehealth during the COVID-19 national public health emergency. (AMA)

## 2019-07-12 ENCOUNTER — Ambulatory Visit
Admission: RE | Admit: 2019-07-12 | Discharge: 2019-07-12 | Disposition: A | Discharge status: Home / Self Care | Payer: Medicare HMO | Source: Ambulatory Visit | Attending: Pain Medicine | Admitting: Pain Medicine

## 2019-07-12 DIAGNOSIS — M4310 Spondylolisthesis, site unspecified: Secondary | ICD-10-CM | POA: Diagnosis present

## 2019-07-12 DIAGNOSIS — M5442 Lumbago with sciatica, left side: Secondary | ICD-10-CM

## 2019-07-12 DIAGNOSIS — M25551 Pain in right hip: Secondary | ICD-10-CM

## 2019-07-12 DIAGNOSIS — M47816 Spondylosis without myelopathy or radiculopathy, lumbar region: Secondary | ICD-10-CM | POA: Diagnosis present

## 2019-07-12 DIAGNOSIS — M5416 Radiculopathy, lumbar region: Secondary | ICD-10-CM | POA: Insufficient documentation

## 2019-07-12 DIAGNOSIS — M48061 Spinal stenosis, lumbar region without neurogenic claudication: Secondary | ICD-10-CM | POA: Diagnosis present

## 2019-07-12 DIAGNOSIS — G8929 Other chronic pain: Secondary | ICD-10-CM | POA: Diagnosis present

## 2019-08-08 ENCOUNTER — Observation Stay
Admission: EM | Admit: 2019-08-08 | Discharge: 2019-08-09 | Disposition: A | Discharge status: Home / Self Care | Payer: Medicare HMO | Attending: Internal Medicine | Admitting: Internal Medicine

## 2019-08-08 ENCOUNTER — Other Ambulatory Visit: Payer: Self-pay

## 2019-08-08 DIAGNOSIS — K5730 Diverticulosis of large intestine without perforation or abscess without bleeding: Secondary | ICD-10-CM | POA: Insufficient documentation

## 2019-08-08 DIAGNOSIS — K219 Gastro-esophageal reflux disease without esophagitis: Secondary | ICD-10-CM | POA: Insufficient documentation

## 2019-08-08 DIAGNOSIS — M5137 Other intervertebral disc degeneration, lumbosacral region: Secondary | ICD-10-CM | POA: Diagnosis not present

## 2019-08-08 DIAGNOSIS — Z8249 Family history of ischemic heart disease and other diseases of the circulatory system: Secondary | ICD-10-CM | POA: Insufficient documentation

## 2019-08-08 DIAGNOSIS — M542 Cervicalgia: Secondary | ICD-10-CM | POA: Insufficient documentation

## 2019-08-08 DIAGNOSIS — Z8711 Personal history of peptic ulcer disease: Secondary | ICD-10-CM | POA: Insufficient documentation

## 2019-08-08 DIAGNOSIS — K222 Esophageal obstruction: Secondary | ICD-10-CM | POA: Diagnosis not present

## 2019-08-08 DIAGNOSIS — M25551 Pain in right hip: Secondary | ICD-10-CM | POA: Insufficient documentation

## 2019-08-08 DIAGNOSIS — G894 Chronic pain syndrome: Secondary | ICD-10-CM | POA: Insufficient documentation

## 2019-08-08 DIAGNOSIS — K5909 Other constipation: Secondary | ICD-10-CM | POA: Insufficient documentation

## 2019-08-08 DIAGNOSIS — E785 Hyperlipidemia, unspecified: Secondary | ICD-10-CM | POA: Insufficient documentation

## 2019-08-08 DIAGNOSIS — K922 Gastrointestinal hemorrhage, unspecified: Secondary | ICD-10-CM | POA: Diagnosis not present

## 2019-08-08 DIAGNOSIS — N183 Chronic kidney disease, stage 3 unspecified: Secondary | ICD-10-CM | POA: Diagnosis not present

## 2019-08-08 DIAGNOSIS — R001 Bradycardia, unspecified: Secondary | ICD-10-CM | POA: Insufficient documentation

## 2019-08-08 DIAGNOSIS — Z20828 Contact with and (suspected) exposure to other viral communicable diseases: Secondary | ICD-10-CM | POA: Insufficient documentation

## 2019-08-08 DIAGNOSIS — D649 Anemia, unspecified: Secondary | ICD-10-CM

## 2019-08-08 DIAGNOSIS — M1990 Unspecified osteoarthritis, unspecified site: Secondary | ICD-10-CM | POA: Diagnosis not present

## 2019-08-08 DIAGNOSIS — K640 First degree hemorrhoids: Secondary | ICD-10-CM | POA: Diagnosis not present

## 2019-08-08 DIAGNOSIS — I129 Hypertensive chronic kidney disease with stage 1 through stage 4 chronic kidney disease, or unspecified chronic kidney disease: Secondary | ICD-10-CM | POA: Diagnosis not present

## 2019-08-08 DIAGNOSIS — K921 Melena: Secondary | ICD-10-CM | POA: Diagnosis not present

## 2019-08-08 DIAGNOSIS — D62 Acute posthemorrhagic anemia: Secondary | ICD-10-CM | POA: Insufficient documentation

## 2019-08-08 DIAGNOSIS — Z79899 Other long term (current) drug therapy: Secondary | ICD-10-CM | POA: Diagnosis not present

## 2019-08-08 DIAGNOSIS — Z87891 Personal history of nicotine dependence: Secondary | ICD-10-CM | POA: Diagnosis not present

## 2019-08-08 DIAGNOSIS — Z79891 Long term (current) use of opiate analgesic: Secondary | ICD-10-CM | POA: Insufficient documentation

## 2019-08-08 DIAGNOSIS — K625 Hemorrhage of anus and rectum: Secondary | ICD-10-CM

## 2019-08-08 LAB — CBC WITH DIFFERENTIAL/PLATELET
Abs Immature Granulocytes: 0.09 10*3/uL — ABNORMAL HIGH (ref 0.00–0.07)
Abs Immature Granulocytes: 0.05 10*3/uL (ref 0.00–0.07)
Abs Immature Granulocytes: 0.04 10*3/uL (ref 0.00–0.07)
Basophils Absolute: 0.1 10*3/uL (ref 0.0–0.1)
Basophils Absolute: 0.1 10*3/uL (ref 0.0–0.1)
Basophils Absolute: 0.1 10*3/uL (ref 0.0–0.1)
Basophils Relative: 1 %
Basophils Relative: 1 %
Basophils Relative: 1 %
Eosinophils Absolute: 0.2 10*3/uL (ref 0.0–0.5)
Eosinophils Absolute: 0.1 10*3/uL (ref 0.0–0.5)
Eosinophils Absolute: 0.1 10*3/uL (ref 0.0–0.5)
Eosinophils Relative: 1 %
Eosinophils Relative: 1 %
Eosinophils Relative: 1 %
HCT: 22.4 % — ABNORMAL LOW (ref 39.0–52.0)
HCT: 28.9 % — ABNORMAL LOW (ref 39.0–52.0)
HCT: 28 % — ABNORMAL LOW (ref 39.0–52.0)
Hemoglobin: 6.4 g/dL — ABNORMAL LOW (ref 13.0–17.0)
Hemoglobin: 8.7 g/dL — ABNORMAL LOW (ref 13.0–17.0)
Hemoglobin: 8.5 g/dL — ABNORMAL LOW (ref 13.0–17.0)
Immature Granulocytes: 1 %
Immature Granulocytes: 1 %
Immature Granulocytes: 0 %
Lymphocytes Relative: 11 %
Lymphocytes Relative: 14 %
Lymphocytes Relative: 13 %
Lymphs Abs: 1.6 10*3/uL (ref 0.7–4.0)
Lymphs Abs: 1.4 10*3/uL (ref 0.7–4.0)
Lymphs Abs: 1.4 10*3/uL (ref 0.7–4.0)
MCH: 22.6 pg — ABNORMAL LOW (ref 26.0–34.0)
MCH: 24.6 pg — ABNORMAL LOW (ref 26.0–34.0)
MCH: 24.6 pg — ABNORMAL LOW (ref 26.0–34.0)
MCHC: 28.6 g/dL — ABNORMAL LOW (ref 30.0–36.0)
MCHC: 30.1 g/dL (ref 30.0–36.0)
MCHC: 30.4 g/dL (ref 30.0–36.0)
MCV: 79.2 fL — ABNORMAL LOW (ref 80.0–100.0)
MCV: 81.9 fL (ref 80.0–100.0)
MCV: 81.2 fL (ref 80.0–100.0)
Monocytes Absolute: 1.1 10*3/uL — ABNORMAL HIGH (ref 0.1–1.0)
Monocytes Absolute: 0.9 10*3/uL (ref 0.1–1.0)
Monocytes Absolute: 1.1 10*3/uL — ABNORMAL HIGH (ref 0.1–1.0)
Monocytes Relative: 8 %
Monocytes Relative: 8 %
Monocytes Relative: 10 %
Neutro Abs: 11.2 10*3/uL — ABNORMAL HIGH (ref 1.7–7.7)
Neutro Abs: 8.1 10*3/uL — ABNORMAL HIGH (ref 1.7–7.7)
Neutro Abs: 7.7 10*3/uL (ref 1.7–7.7)
Neutrophils Relative %: 78 %
Neutrophils Relative %: 75 %
Neutrophils Relative %: 75 %
Platelets: 254 10*3/uL (ref 150–400)
Platelets: 209 10*3/uL (ref 150–400)
Platelets: 209 10*3/uL (ref 150–400)
RBC: 2.83 MIL/uL — ABNORMAL LOW (ref 4.22–5.81)
RBC: 3.53 MIL/uL — ABNORMAL LOW (ref 4.22–5.81)
RBC: 3.45 MIL/uL — ABNORMAL LOW (ref 4.22–5.81)
RDW: 15.9 % — ABNORMAL HIGH (ref 11.5–15.5)
RDW: 16.2 % — ABNORMAL HIGH (ref 11.5–15.5)
RDW: 16.1 % — ABNORMAL HIGH (ref 11.5–15.5)
WBC: 14.1 10*3/uL — ABNORMAL HIGH (ref 4.0–10.5)
WBC: 10.6 10*3/uL — ABNORMAL HIGH (ref 4.0–10.5)
WBC: 10.3 10*3/uL (ref 4.0–10.5)
nRBC: 0 % (ref 0.0–0.2)
nRBC: 0 % (ref 0.0–0.2)
nRBC: 0 % (ref 0.0–0.2)

## 2019-08-08 LAB — COMPREHENSIVE METABOLIC PANEL
ALT: 15 U/L (ref 0–44)
AST: 24 U/L (ref 15–41)
Albumin: 3.7 g/dL (ref 3.5–5.0)
Alkaline Phosphatase: 57 U/L (ref 38–126)
Anion gap: 10 (ref 5–15)
BUN: 23 mg/dL (ref 8–23)
CO2: 23 mmol/L (ref 22–32)
Calcium: 8.7 mg/dL — ABNORMAL LOW (ref 8.9–10.3)
Chloride: 104 mmol/L (ref 98–111)
Creatinine, Ser: 1.14 mg/dL (ref 0.61–1.24)
GFR calc Af Amer: >60 mL/min (ref >60)
GFR calc non Af Amer: >60 mL/min (ref >60)
Glucose, Bld: 108 mg/dL — ABNORMAL HIGH (ref 70–99)
Potassium: 3.8 mmol/L (ref 3.5–5.1)
Sodium: 137 mmol/L (ref 135–145)
Total Bilirubin: 0.6 mg/dL (ref 0.3–1.2)
Total Protein: 6.9 g/dL (ref 6.5–8.1)

## 2019-08-08 LAB — URINALYSIS, ROUTINE W REFLEX MICROSCOPIC
Bacteria, UA: NONE SEEN
Bilirubin Urine: NEGATIVE
Glucose, UA: NEGATIVE mg/dL
Hgb urine dipstick: NEGATIVE
Ketones, ur: NEGATIVE mg/dL
Nitrite: NEGATIVE
Protein, ur: NEGATIVE mg/dL
Specific Gravity, Urine: 1.018 (ref 1.005–1.030)
pH: 5 (ref 5.0–8.0)

## 2019-08-08 LAB — PREPARE RBC (CROSSMATCH)

## 2019-08-08 LAB — PROTIME-INR
INR: 1.1 (ref 0.8–1.2)
Prothrombin Time: 13.9 seconds (ref 11.4–15.2)

## 2019-08-08 LAB — LIPASE, BLOOD: Lipase: 17 U/L (ref 11–51)

## 2019-08-08 LAB — SARS CORONAVIRUS 2 BY RT PCR (HOSPITAL ORDER, PERFORMED IN ~~LOC~~ HOSPITAL LAB): SARS Coronavirus 2: NEGATIVE

## 2019-08-08 LAB — APTT: aPTT: 30 seconds (ref 24–36)

## 2019-08-08 MED ORDER — SODIUM CHLORIDE 0.9 % IV SOLN
10.0000 mL/h | Freq: Once | INTRAVENOUS | Status: AC
  Administered 2019-08-08: 10 mL/h via INTRAVENOUS

## 2019-08-08 MED ORDER — ATORVASTATIN CALCIUM 20 MG PO TABS
20.0000 mg | ORAL_TABLET | Freq: Every day | ORAL | Status: DC
  Administered 2019-08-08: 20 mg via ORAL
  Filled 2019-08-08: qty 1

## 2019-08-08 MED ORDER — ONDANSETRON HCL 4 MG/2ML IJ SOLN: 4.0000 mg | Freq: Four times a day (QID) | INTRAMUSCULAR | Status: DC | PRN

## 2019-08-08 MED ORDER — ACETAMINOPHEN 325 MG PO TABS: 650.0000 mg | ORAL_TABLET | Freq: Four times a day (QID) | ORAL | Status: DC | PRN

## 2019-08-08 MED ORDER — PANTOPRAZOLE SODIUM 40 MG PO TBEC
40.0000 mg | DELAYED_RELEASE_TABLET | Freq: Two times a day (BID) | ORAL | Status: DC
  Administered 2019-08-08 – 2019-08-09 (×3): 40 mg via ORAL
  Filled 2019-08-08 (×3): qty 1

## 2019-08-08 MED ORDER — ONDANSETRON HCL 4 MG PO TABS
4.0000 mg | ORAL_TABLET | Freq: Four times a day (QID) | ORAL | Status: DC | PRN
  Filled 2019-08-08: qty 1

## 2019-08-08 MED ORDER — POLYETHYLENE GLYCOL 3350 17 G PO PACK
17.0000 g | PACK | Freq: Every day | ORAL | Status: DC
  Filled 2019-08-08: qty 1

## 2019-08-08 MED ORDER — PEG 3350-KCL-NA BICARB-NACL 420 G PO SOLR
4000.0000 mL | Freq: Once | ORAL | Status: AC
  Administered 2019-08-08: 4000 mL via ORAL
  Filled 2019-08-08: qty 4000

## 2019-08-08 MED ORDER — LACTULOSE 10 GM/15ML PO SOLN
20.0000 g | Freq: Three times a day (TID) | ORAL | Status: DC
  Administered 2019-08-08 (×2): 20 g via ORAL
  Filled 2019-08-08 (×7): qty 30

## 2019-08-08 MED ORDER — AMLODIPINE BESYLATE 10 MG PO TABS
10.0000 mg | ORAL_TABLET | Freq: Every day | ORAL | Status: DC
  Administered 2019-08-08 – 2019-08-09 (×2): 10 mg via ORAL
  Filled 2019-08-08: qty 2
  Filled 2019-08-08: qty 1

## 2019-08-08 MED ORDER — ACETAMINOPHEN 650 MG RE SUPP
650.0000 mg | Freq: Four times a day (QID) | RECTAL | Status: DC | PRN
  Filled 2019-08-08: qty 1

## 2019-08-08 MED ORDER — TRAMADOL HCL 50 MG PO TABS
100.0000 mg | ORAL_TABLET | Freq: Four times a day (QID) | ORAL | Status: DC | PRN
  Administered 2019-08-08 – 2019-08-09 (×2): 100 mg via ORAL
  Filled 2019-08-08 (×2): qty 2

## 2019-08-08 MED ORDER — FERROUS SULFATE 325 (65 FE) MG PO TABS
325.0000 mg | ORAL_TABLET | Freq: Every day | ORAL | Status: DC
  Administered 2019-08-09: 325 mg via ORAL
  Filled 2019-08-08 (×2): qty 1

## 2019-08-08 MED ORDER — PANTOPRAZOLE SODIUM 40 MG IV SOLR
40.0000 mg | Freq: Once | INTRAVENOUS | Status: AC
  Administered 2019-08-08: 40 mg via INTRAVENOUS
  Filled 2019-08-08: qty 40

## 2019-08-08 NOTE — ED Triage Notes (Signed)
Pt c/o hx of diverticulitis , c/o lower abd pain with bloody diarrhea for the past week. States he was constipated and took a laxative and that's when everything started.

## 2019-08-08 NOTE — ED Notes (Signed)
Pt transferred to room 33. Pt stable at this time, NAD noted. Pt a&ox4 and able to request needs. VSS. Blood infusing. Will continue to monitor.

## 2019-08-08 NOTE — H&P (View-Only) (Signed)
GI Inpatient Consult Note  Reason for Consult: Lower GI Bleed, Acute blood loss anemia   Attending Requesting Consult: Dr. Berle Mull, MD  History of Present Illness: Caleb Edwards is a 68 y.o. male seen for evaluation of acute blood loss anemia, lower GI bleeding at the request of Dr. Berle Mull, MD. Pt has a PMH of GI bleeding 2/2 diverticulosis, Hx of diverticulitis, Hx of peptic ulcer disease, HTN, chronic anemia, chronic back pain. He presented to the ED this morning for lower GI bleeding over the past week. He reports he was initially constipated last week and noticed some spotting in the stool that resolved on its own. However, he took a stool softener last night and started to notice progressively more and more painless bright red blood with some clots in his stool and in the toilet bowl. He was not having any associated fever, vomiting, abdominal pain, or night sweats. He was concerned about GI bleeding so he decided to come to the ED. Upon presentation to the ED, he was found to have a precipitous drop in his hemoglobin to 6.4. Review of labs show in 02/2018, he had a hemoglobin of 11.3. Labs were also significant for WBC 14.1, MCV 79, platelets 254. He has received 2 units pRBCs. He reports he is currently having no abdominal pain. He denies any new medication changes or new OTC vitamins or supplements. He reports he does have some mild constipation, for which he takes miralax as needed. He denies any recent sick contacts or recent travel. He denies any fecal urgency or incontinence. He reports that the bloody stool awakened him up from sleep around 4:30 this morning. There has been no tarry stools. He reports last episode was this morning prior to coming to the ED. He reports he takes take appx 5-6 BC powders a month for arthralgias. He denies any dyspepsia symptoms, nausea, vomiting, dysphagia, or odynophagia. Of note, he does have a history of GI bleeding which has been evaluated by  endoscopies. EGD and colonoscopy performed for hematochezia 02/2018 by Dr. Vicente Males showed no evidence of active or occult GI bleeding. Bleeding suspected secondary to diverticulosis. He had EGD 03/2018 with dilatation of duodenal stricture.    Last Colonoscopy:   Colonoscopy 02/2018 - Diverticulosis in the entire examined colon. - Non-bleeding internal hemorrhoids. - The examined portion of the ileum was normal. - No specimens collected.  Colonoscopy 07/2017 - Preparation of the colon was poor. - Diverticulosis in the entire examined colon. - No specimens collected.   Last Endoscopy:  EGD 01/2016 - Mild Schatzki ring. - Small hiatal hernia. - Non-bleeding gastric ulcer with no stigmata of bleeding. - Acquired duodenal stenosis. - No specimens collected.  EGD 07/2017 - Medium-sized hiatal hernia. - Normal stomach. - Acquired duodenal stenosis. - No specimens collected.  EGD 02/2018 - performed 2/2 hematochezia - Normal stomach. - Normal esophagus. - Acquired duodenal stenosis. - No specimens collected.   EGD 03/2018 - Normal esophagus. - Normal stomach. - Acquired duodenal stenosis. Dilation with a 12 mm pyloric balloon dilator was performed. - No specimens collected.  Past Medical History:  Past Medical History:  Diagnosis Date  . Back pain   . Blood transfusion without reported diagnosis 12/2015   bleeding ulcer hospitalization.   . Diverticulitis   . Hypertension   . Ulcer, stomach peptic, chronic     Problem List: Patient Active Problem List   Diagnosis Date Noted  . Lower GI bleed 08/08/2019  . Pharmacologic therapy  06/04/2019  . Disorder of skeletal system 06/04/2019  . Problems influencing health status 06/04/2019  . Chronic hip pain (Right) 06/04/2019  . Grade 1 Anterolisthesis (7-3mm) of L5 over S1 06/13/2018  . DDD (degenerative disc disease), lumbosacral 06/13/2018  . Lumbar facet arthropathy 06/13/2018  . HTN (hypertension) 02/03/2018  . CKD  (chronic kidney disease), stage III 02/03/2018  . Rectal bleeding 01/23/2018  . Blood in stool   . GI bleed 07/31/2017  . Acute blood loss anemia 07/31/2017  . Abdominal pain 07/31/2017  . Near syncope 07/31/2017  . Chronic pain syndrome 09/09/2016  . Vitamin D insufficiency 01/07/2016  . Symptomatic anemia 01/02/2016  . Long term current use of opiate analgesic 09/24/2015  . Long term prescription opiate use 09/24/2015  . Opiate use 09/24/2015  . Encounter for therapeutic drug level monitoring 09/24/2015  . Encounter for chronic pain management 09/24/2015  . Insomnia secondary to chronic pain 09/24/2015  . Osteoarthritis 09/24/2015  . Chronic low back pain (Primary Area of Pain) (Bilateral) (R>L) 09/24/2015  . Lumbar spondylosis 09/24/2015  . Chronic lower extremity pain (Secondary area of Pain) (Left) 09/24/2015  . 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 Lumbar facet syndrome (Bilateral) (R>L) 09/24/2015  . Diffuse myofascial pain syndrome 09/24/2015  . L4-5 disc bulge 09/24/2015  . 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 Chronic neck pain 09/24/2015  . Cervical spondylosis 09/24/2015    Past Surgical History: Past Surgical History:  Procedure Laterality Date  . COLONOSCOPY WITH PROPOFOL N/A 08/02/2017   Procedure: COLONOSCOPY WITH PROPOFOL;  Surgeon: Midge Minium, MD;  Location: [facility] ENDOSCOPY;  Service: Endoscopy;  Laterality: N/A;  . COLONOSCOPY WITH PROPOFOL N/A 02/05/2018   Procedure: COLONOSCOPY WITH PROPOFOL;  Surgeon: Wyline Mood, MD;  Location: [facility] ENDOSCOPY;  Service: Gastroenterology;  Laterality: N/A;  . ESOPHAGOGASTRODUODENOSCOPY (EGD) WITH PROPOFOL N/A 01/03/2016   Procedure: ESOPHAGOGASTRODUODENOSCOPY (EGD) WITH PROPOFOL;  Surgeon: Scot Jun, MD;  Location: [facility] ENDOSCOPY;  Service: Endoscopy;  Laterality: N/A;  . ESOPHAGOGASTRODUODENOSCOPY (EGD) WITH PROPOFOL  08/02/2017   Procedure:  ESOPHAGOGASTRODUODENOSCOPY (EGD) WITH PROPOFOL;  Surgeon: Midge Minium, MD;  Location: [facility] ENDOSCOPY;  Service: Endoscopy;;  . ESOPHAGOGASTRODUODENOSCOPY (EGD) WITH PROPOFOL N/A 02/05/2018   Procedure: ESOPHAGOGASTRODUODENOSCOPY (EGD) WITH PROPOFOL;  Surgeon: Wyline Mood, MD;  Location[facility] ENDOSCOPY;  Service: Gastroenterology;  Laterality: N/A;  . ESOPHAGOGASTRODUODENOSCOPY (EGD) WITH PROPOFOL N/A 03/30/2018   Procedure: ESOPHAGOGASTRODUODENOSCOPY (EGD) WITH PROPOFOL WITH DILATION;  Surgeon: Wyline Mood, MD;  Location: [facility] ENDOSCOPY;  Service: Gastroenterology;  Laterality: N/A;    Allergies: No Known Allergies  Home Medications: (Not in a hospital admission)  Home medication reconciliation was completed with the patient.   Scheduled Inpatient Medications:   . amLODipine  10 mg Oral Daily  . atorvastatin  20 mg Oral QHS  . [START ON 08/09/2019] ferrous sulfate  325 mg Oral Q breakfast  . lactulose  20 g Oral TID  . pantoprazole  40 mg Oral BID AC  . polyethylene glycol  17 g Oral Daily  . polyethylene glycol-electrolytes  4,000 mL Oral Once    Continuous Inpatient Infusions:    PRN Inpatient Medications:  acetaminophen **OR** acetaminophen, ondansetron **OR** ondansetron (ZOFRAN) IV, traMADol  Family History: family history includes Diabetes in his mother; Heart disease in his father; Prostate cancer in his brother.  The patient's family history is negative for inflammatory bowel disorders, GI malignancy, or solid organ transplantation.  Social History:   reports that he has quit smoking. He  has never used smokeless tobacco. He reports that he does not drink alcohol or use drugs. The patient denies ETOH, tobacco, or drug use.   Review of Systems: Constitutional: Weight is stable.  Eyes: No changes in vision. ENT: No oral lesions, sore throat.  GI: see HPI.  Heme/Lymph: No easy bruising.  CV: No chest pain.  GU: No hematuria.  Integumentary: No rashes.  Neuro: No  headaches.  Psych: No depression/anxiety.  Endocrine: No heat/cold intolerance.  Allergic/Immunologic: No urticaria.  Resp: No cough, SOB.  Musculoskeletal: No joint swelling.    Physical Examination: BP (!) 163/76   Pulse 63   Temp 98.1 F (36.7 C) (Oral)   Resp 14   Ht 6' (1.829 m)   Wt 81.6 kg   SpO2 100%   BMI 24.41 kg/m   Pleasant, well-appearing male in ED stretcher. Wife present in room. Gen: NAD, alert and oriented x 4 HEENT: PEERLA, EOMI, Neck: supple, no JVD or thyromegaly Chest: CTA bilaterally, no wheezes, crackles, or other adventitious sounds CV: RRR, no m/g/c/r Abd: soft, NT, ND, +BS in all four quadrants; no HSM, guarding, ridigity, or rebound tenderness Ext: no edema, well perfused with 2+ pulses, Skin: no rash or lesions noted Lymph: no LAD  Data: Lab Results  Component Value Date   WBC 10.6 (H) 08/08/2019   HGB 8.7 (L) 08/08/2019   HCT 28.9 (L) 08/08/2019   MCV 81.9 08/08/2019   PLT 209 08/08/2019   Recent Labs  Lab 08/08/19 0803 08/08/19 1418  HGB 6.4* 8.7*   Lab Results  Component Value Date   NA 137 08/08/2019   K 3.8 08/08/2019   CL 104 08/08/2019   CO2 23 08/08/2019   BUN 23 08/08/2019   CREATININE 1.14 08/08/2019   Lab Results  Component Value Date   ALT 15 08/08/2019   AST 24 08/08/2019   ALKPHOS 57 08/08/2019   BILITOT 0.6 08/08/2019   Recent Labs  Lab 08/08/19 0803  APTT 30  INR 1.1   Assessment/Plan:  68 y/o AA male with a PMH of CKD Stage III, chronic pain, HTN, OA, Hx of chronic anemia admitted to the hospital for acute blood loss anemia  1. Acute blood loss anemia  2. Painless hematochezia 3. Sigmoid diverticulosis 4. Hx of GI bleeding 2/2 diverticular bleed  COVID-19 test - NEGATIVE  - Pt presented to the ED today for one-week history of painless hematochezia. Lab work-up shows evidence of acute blood loss anemia and hemoglobin precipitously dropped to 6.4. He is s/p 2 units of pRBCs and repeat CBC shows  improvement to 8.7. - Differential includes diverticular bleed, AVMs, colon polyp, Meckel's diverticulum, malignancy, anal outlet etiology, or possibly upper GI source though unlikely given symptomology and history points to lower GI source. - Continue to monitor H&H. Transfuse for Hgb <8.0. - Agree with acid suppression with Protonix - Avoid NSAIDs - No signs of active GI bleeding. If there is hematochezia today, would advise tagged RBC scan to identify active bleed and consult vascular if positive - Plan for EGD and colonoscopy with Dr. Norma Fredricksonoledo tomorrow - Clear liquid diet today. NPO after midnight. Pt will complete Trilyte bowel prep starting 1700 today. - See procedure note for findings and further recommendations. Consider capsule if EGD/colon are neg   I reviewed the risks (including bleeding, perforation, infection, anesthesia complications, cardiac/respiratory complications), benefits and alternatives of EGD and colonoscopy. Patient consents to proceed.     Thank you for the consult. Please call with  questions or concerns.  Mickle Mallory [facility] Gastro[facility] [phone] (Cell)

## 2019-08-08 NOTE — ED Notes (Addendum)
Admitting at bedside.  Dr. Posey Pronto agrees to given each unit over 1 hour pending that patient can tolerate and has no adverse reactions.

## 2019-08-08 NOTE — ED Notes (Signed)
Pt up to restroom without difficulty.  

## 2019-08-08 NOTE — H&P (Signed)
[facility] History and Physical   Patient: Caleb Edwards   [facility] DOB: 04/29/1951   DOA: 08/08/2019   DOS: 08/08/2019   DOS: the patient was seen and examined on 08/08/2019  Patient coming from: The patient is coming from Home  Chief Complaint: Bright red blood per rectum  HPI: Caleb Edwards is a 68 y.o. male with Past medical history of diverticulosis, HTN, chronic anemia, chronic back pain on tramadol. Patient presents with complaints of bright red blood per rectum. Patient was constipated.  2 weeks ago he noticed some spotting blood in the stool but it went away. Took some stool softener 1 week ago and after that he started having bright red blood with some clots per rectum without any pain. He reported some nausea this morning but no vomiting.  No fever no chills no chest pain. Abdominal pain at the time of my evaluation. He reported having similar symptoms in 2019 and underwent colonoscopy. Also required blood transfusion but that as well. He does not take any ibuprofen Motrin Aleve naproxen no regular basis. Does not take aspirin or any other blood thinners as well. He takes chronic pain medication and appears that does not take stool softeners on a regular basis. No alcohol abuse no drug abuse.  ED Course: Hemoglobin dropped from 11 25-6.5.  Ordered 2 PRBC transfusion and patient was referred for admission.  At his baseline ambulates without assistance independent for most of his ADL;  manages his medication on his own.  Review of Systems: as mentioned in the history of present illness.  All other systems reviewed and are negative.  Past Medical History:  Diagnosis Date  . Back pain   . Blood transfusion without reported diagnosis 12/2015   bleeding ulcer hospitalization.   . Diverticulitis   . Hypertension   . Ulcer, stomach peptic, chronic    Past Surgical History:  Procedure Laterality Date  . COLONOSCOPY WITH  PROPOFOL N/A 08/02/2017   Procedure: COLONOSCOPY WITH PROPOFOL;  Surgeon: Midge MiniumWohl, Darren, MD;  [facility];  Service: Endoscopy;  Laterality: N/A;  . COLONOSCOPY WITH PROPOFOL N/A 02/05/2018   Procedure: COLONOSCOPY WITH PROPOFOL;  Surgeon: Wyline MoodAnna, Kiran, MD;  [facility];  Service: Gastroenterology;  Laterality: N/A;  . ESOPHAGOGASTRODUODENOSCOPY (EGD) WITH PROPOFOL N/A 01/03/2016   Procedure: ESOPHAGOGASTRODUODENOSCOPY (EGD) WITH PROPOFOL;  Surgeon: Scot Junobert T Elliott, MD;  [facility];  Service: Endoscopy;  Laterality: N/A;  . ESOPHAGOGASTRODUODENOSCOPY (EGD) WITH PROPOFOL  08/02/2017   Procedure: ESOPHAGOGASTRODUODENOSCOPY (EGD) WITH PROPOFOL;  Surgeon: Midge MiniumWohl, Darren, MD;  Location: [facility] ENDOSCOPY;  Service: Endoscopy;;  . ESOPHAGOGASTRODUODENOSCOPY (EGD) WITH PROPOFOL N/A 02/05/2018   Procedure: ESOPHAGOGASTRODUODENOSCOPY (EGD) WITH PROPOFOL;  Surgeon: Wyline MoodAnna, Kiran, MD;  [facility] END[facility]COPY;  Service: Gastroenterology;  Laterality: N/A;  . ESOPHAGOGASTRODUODENOSCOPY (EGD) WITH PROPOFOL N/A 03/30/2018   Procedure: ESOPHAGOGASTRODUODENOSCOPY (EGD) WITH PROPOFOL WITH DILATION;  Surgeon: Wyline MoodAnna, Kiran, MD;  [facility];  Service: Gastroenterology;  Laterality: N/A;   Social History:  reports that he has quit smoking. He has never used smokeless tobacco. He reports that he does not drink alcohol or use drugs.  No Known Allergies   Family history reviewed and not pertinent Family History  Problem Relation Age of Onset  . Diabetes Mother   . Heart disease Father   . Prostate cancer Brother   . Bladder Cancer Neg Hx   . Kidney cancer Neg Hx      Prior to Admission medications   Medication  Sig Start Date End Date Taking? Authorizing Provider  amLODipine (NORVASC) 10 MG tablet Take 1 tablet by mouth daily. 12/31/17   [provider]  atorvastatin (LIPITOR) 20 MG tablet Take 1 tablet by mouth at bedtime. 12/31/17   [provider]   chlorthalidone (HYGROTON) 50 MG tablet Take 1 tablet by mouth daily. 12/31/17   [provider]  ferrous sulfate 325 (65 FE) MG tablet Take 325 mg by mouth daily with breakfast.    [provider]  lactulose (KRISTALOSE) 20 g packet Take 1 packet (20 g total) by mouth 3 (three) times daily. 04/11/18   Jonathon Bellows, MD  lisinopril (PRINIVIL,ZESTRIL) 40 MG tablet Take 1 tablet by mouth daily. 12/31/17   [provider]  pantoprazole (PROTONIX) 40 MG tablet Take 1 tablet (40 mg total) by mouth 2 (two) times daily before a meal. 01/04/16   Demetrios Loll, MD  polyethylene glycol [facility] Floria Raveling) packet Take 17 g by mouth daily. 02/06/18   Dustin Flock, MD  sildenafil (VIAGRA) 100 MG tablet Take 100 mg by mouth daily as needed for erectile dysfunction.    [provider]  traMADol (ULTRAM) 50 MG tablet Take 2 tablets (100 mg total) by mouth every 6 (six) hours as needed for severe pain. Each refill must last 30 days. 03/12/19 09/08/19  Milinda Pointer, MD    Physical Exam: Vitals:   08/08/19 0925 08/08/19 0930 08/08/19 1040 08/08/19 1044  BP: (!) 144/81 (!) 148/85 126/72 126/72  Pulse: 71 65 62 62  Resp: (!) 106 14 16 16   Temp: 97.9 F (36.6 C)  98.5 F (36.9 C) 98.5 F (36.9 C)  TempSrc: Oral  Oral Oral  SpO2: 100% 100% 100% 100%  Weight:      Height:        General: alert and oriented to time, place, and person. Appear in mild distress, affect appropriate Eyes: PERRL, Conjunctiva normal ENT: Oral Mucosa Clear, moist  Neck: no JVD, no Abnormal Mass Or lumps Cardiovascular: S1 and S2 Present, no Murmur, peripheral pulses symmetrical Respiratory: good respiratory effort, Bilateral Air entry equal and Decreased, no signs of accessory muscle use, Clear to Auscultation, no Crackles, no wheezes Abdomen: Bowel Sound present, Soft and no tenderness, no hernia Skin: no rashes  Extremities: no Pedal edema, no calf tenderness Neurologic: without any new focal  findings Gait not checked due to patient safety concerns  Data Reviewed: I have personally reviewed and interpreted labs, imaging as discussed below.  CBC: Recent Labs  Lab 08/08/19 0803  WBC 14.1*  NEUTROABS 11.2*  HGB 6.4*  HCT 22.4*  MCV 79.2*  PLT 875   Basic Metabolic Panel: Recent Labs  Lab 08/08/19 0803  NA 137  K 3.8  CL 104  CO2 23  GLUCOSE 108*  BUN 23  CREATININE 1.14  CALCIUM 8.7*   GFR: Estimated Creatinine Clearance: 70 mL/min (by C-G formula based on SCr of 1.14 mg/dL). Liver Function Tests: Recent Labs  Lab 08/08/19 0803  AST 24  ALT 15  ALKPHOS 57  BILITOT 0.6  PROT 6.9  ALBUMIN 3.7   Recent Labs  Lab 08/08/19 0803  LIPASE 17   No results for input(s): AMMONIA in the last 168 hours. Coagulation Profile: Recent Labs  Lab 08/08/19 0803  INR 1.1   Cardiac Enzymes: No results for input(s): CKTOTAL, CKMB, CKMBINDEX, TROPONINI in the last 168 hours. BNP (last 3 results) No results for input(s): PROBNP in the last 8760 hours. HbA1C: No results  for input(s): HGBA1C in the last 72 hours. CBG: No results for input(s): GLUCAP in the last 168 hours. Lipid Profile: No results for input(s): CHOL, HDL, LDLCALC, TRIG, CHOLHDL, LDLDIRECT in the last 72 hours. Thyroid Function Tests: No results for input(s): TSH, T4TOTAL, FREET4, T3FREE, THYROIDAB in the last 72 hours. Anemia Panel: No results for input(s): VITAMINB12, FOLATE, FERRITIN, TIBC, IRON, RETICCTPCT in the last 72 hours. Urine analysis:    Component Value Date/Time   COLORURINE YELLOW (A) 08/08/2019 0803   APPEARANCEUR CLEAR (A) 08/08/2019 0803   APPEARANCEUR Clear 06/09/2017 1353   LABSPEC 1.018 08/08/2019 0803   PHURINE 5.0 08/08/2019 0803   GLUCOSEU NEGATIVE 08/08/2019 0803   HGBUR NEGATIVE 08/08/2019 0803   BILIRUBINUR NEGATIVE 08/08/2019 0803   BILIRUBINUR Negative 06/09/2017 1353   KETONESUR NEGATIVE 08/08/2019 0803   PROTEINUR NEGATIVE 08/08/2019 0803   NITRITE  NEGATIVE 08/08/2019 0803   LEUKOCYTESUR MODERATE (A) 08/08/2019 0803    Radiological Exams on Admission: No results found.  I reviewed all nursing notes, pharmacy notes, vitals, pertinent old records.  Assessment/Plan 1. Lower GI bleed Likely diverticular bleed. No pain. Associated with with constipation Gastroenterology consulted. Patient will be kept n.p.o. after midnight. For now clear liquid diet. May require colonoscopy. Monitor on telemetry.  2.  Acute blood loss anemia 2 PRBC ordered for now. Monitor H&H. Transfuse hemoglobin less than 7 or hemodynamic instability.  3.  Essential hypertension. Blood pressure well controlled. Sinus bradycardia. Continue amlodipine. Holding lisinopril and chlorthalidone.  4.  Chronic constipation. Patient is on lactulose as well as MiraLAX on a regular basis. We will continue the same.  5.  Dyslipidemia. Continue Lipitor.  6.  Microcytosis. Suspecting iron deficiency anemia prevention patient is on iron supplementation at home which we will continue. Outpatient work-up for iron level checkup.  7.  Chronic back pain. Continue tramadol. PDMP reviewed. Patient should continue to take his stool softener along with his chronic tramadol.  Nutrition: NPO after midnight and Clear liquid diet DVT Prophylaxis: SCD, pharmacological prophylaxis contraindicated due to GI bleed  Advance goals of care discussion: Full code   Consults: I personally Discussed with GI Dr Norma Fredrickson  Family Communication: family was present at bedside, at the time of interview.  Opportunity was given to ask question and all questions were answered satisfactorily.  Disposition: Admitted as observation, telemetry unit. Likely to be discharged home, in 1-2 days.  I have discussed plan of care as described above with RN and patient/family.  Author: Lynden Oxford, MD [facility] 08/08/2019 10:45 AM   To reach On-call, see care teams to locate the  attending and reach out to them via [email or web address]. If 7PM-7AM, please contact night-coverage If you still have difficulty reaching the attending provider, please page the [facility] (Director on Call) for [facility] on amion for assistance.

## 2019-08-08 NOTE — ED Notes (Signed)
Over first 15 minutes of blood transfusion, pt has had no obvious adverse reactions. Dr. Jari Pigg requests remaining blood to be give over 1 hour.

## 2019-08-08 NOTE — Consult Note (Signed)
GI Inpatient Consult Note  Reason for Consult: Lower GI Bleed, Acute blood loss anemia   Attending Requesting Consult: Dr. Berle Mull, MD  History of Present Illness: Caleb Edwards is a 68 y.o. male seen for evaluation of acute blood loss anemia, lower GI bleeding at the request of Dr. Berle Mull, MD. Pt has a PMH of GI bleeding 2/2 diverticulosis, Hx of diverticulitis, Hx of peptic ulcer disease, HTN, chronic anemia, chronic back pain. He presented to the ED this morning for lower GI bleeding over the past week. He reports he was initially constipated last week and noticed some spotting in the stool that resolved on its own. However, he took a stool softener last night and started to notice progressively more and more painless bright red blood with some clots in his stool and in the toilet bowl. He was not having any associated fever, vomiting, abdominal pain, or night sweats. He was concerned about GI bleeding so he decided to come to the ED. Upon presentation to the ED, he was found to have a precipitous drop in his hemoglobin to 6.4. Review of labs show in 02/2018, he had a hemoglobin of 11.3. Labs were also significant for WBC 14.1, MCV 79, platelets 254. He has received 2 units pRBCs. He reports he is currently having no abdominal pain. He denies any new medication changes or new OTC vitamins or supplements. He reports he does have some mild constipation, for which he takes miralax as needed. He denies any recent sick contacts or recent travel. He denies any fecal urgency or incontinence. He reports that the bloody stool awakened him up from sleep around 4:30 this morning. There has been no tarry stools. He reports last episode was this morning prior to coming to the ED. He reports he takes take appx 5-6 BC powders a month for arthralgias. He denies any dyspepsia symptoms, nausea, vomiting, dysphagia, or odynophagia. Of note, he does have a history of GI bleeding which has been evaluated by  endoscopies. EGD and colonoscopy performed for hematochezia 02/2018 by Dr. Vicente Males showed no evidence of active or occult GI bleeding. Bleeding suspected secondary to diverticulosis. He had EGD 03/2018 with dilatation of duodenal stricture.    Last Colonoscopy:   Colonoscopy 02/2018 - Diverticulosis in the entire examined colon. - Non-bleeding internal hemorrhoids. - The examined portion of the ileum was normal. - No specimens collected.  Colonoscopy 07/2017 - Preparation of the colon was poor. - Diverticulosis in the entire examined colon. - No specimens collected.   Last Endoscopy:  EGD 01/2016 - Mild Schatzki ring. - Small hiatal hernia. - Non-bleeding gastric ulcer with no stigmata of bleeding. - Acquired duodenal stenosis. - No specimens collected.  EGD 07/2017 - Medium-sized hiatal hernia. - Normal stomach. - Acquired duodenal stenosis. - No specimens collected.  EGD 02/2018 - performed 2/2 hematochezia - Normal stomach. - Normal esophagus. - Acquired duodenal stenosis. - No specimens collected.   EGD 03/2018 - Normal esophagus. - Normal stomach. - Acquired duodenal stenosis. Dilation with a 12 mm pyloric balloon dilator was performed. - No specimens collected.  Past Medical History:  Past Medical History:  Diagnosis Date  . Back pain   . Blood transfusion without reported diagnosis 12/2015   bleeding ulcer hospitalization.   . Diverticulitis   . Hypertension   . Ulcer, stomach peptic, chronic     Problem List: Patient Active Problem List   Diagnosis Date Noted  . Lower GI bleed 08/08/2019  . Pharmacologic therapy  06/04/2019  . Disorder of skeletal system 06/04/2019  . Problems influencing health status 06/04/2019  . Chronic hip pain (Right) 06/04/2019  . Grade 1 Anterolisthesis (7-3mm) of L5 over S1 06/13/2018  . DDD (degenerative disc disease), lumbosacral 06/13/2018  . Lumbar facet arthropathy 06/13/2018  . HTN (hypertension) 02/03/2018  . CKD  (chronic kidney disease), stage III 02/03/2018  . Rectal bleeding 01/23/2018  . Blood in stool   . GI bleed 07/31/2017  . Acute blood loss anemia 07/31/2017  . Abdominal pain 07/31/2017  . Near syncope 07/31/2017  . Chronic pain syndrome 09/09/2016  . Vitamin D insufficiency 01/07/2016  . Symptomatic anemia 01/02/2016  . Long term current use of opiate analgesic 09/24/2015  . Long term prescription opiate use 09/24/2015  . Opiate use 09/24/2015  . Encounter for therapeutic drug level monitoring 09/24/2015  . Encounter for chronic pain management 09/24/2015  . Insomnia secondary to chronic pain 09/24/2015  . Osteoarthritis 09/24/2015  . Chronic low back pain (Primary Area of Pain) (Bilateral) (R>L) 09/24/2015  . Lumbar spondylosis 09/24/2015  . Chronic lower extremity pain (Secondary area of Pain) (Left) 09/24/2015  . 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 Lumbar facet syndrome (Bilateral) (R>L) 09/24/2015  . Diffuse myofascial pain syndrome 09/24/2015  . L4-5 disc bulge 09/24/2015  . 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 Chronic neck pain 09/24/2015  . Cervical spondylosis 09/24/2015    Past Surgical History: Past Surgical History:  Procedure Laterality Date  . COLONOSCOPY WITH PROPOFOL N/A 08/02/2017   Procedure: COLONOSCOPY WITH PROPOFOL;  Surgeon: Midge Minium, MD;  Location: [facility] ENDOSCOPY;  Service: Endoscopy;  Laterality: N/A;  . COLONOSCOPY WITH PROPOFOL N/A 02/05/2018   Procedure: COLONOSCOPY WITH PROPOFOL;  Surgeon: Wyline Mood, MD;  Location: [facility] ENDOSCOPY;  Service: Gastroenterology;  Laterality: N/A;  . ESOPHAGOGASTRODUODENOSCOPY (EGD) WITH PROPOFOL N/A 01/03/2016   Procedure: ESOPHAGOGASTRODUODENOSCOPY (EGD) WITH PROPOFOL;  Surgeon: Scot Jun, MD;  Location: [facility] ENDOSCOPY;  Service: Endoscopy;  Laterality: N/A;  . ESOPHAGOGASTRODUODENOSCOPY (EGD) WITH PROPOFOL  08/02/2017   Procedure:  ESOPHAGOGASTRODUODENOSCOPY (EGD) WITH PROPOFOL;  Surgeon: Midge Minium, MD;  Location: [facility] ENDOSCOPY;  Service: Endoscopy;;  . ESOPHAGOGASTRODUODENOSCOPY (EGD) WITH PROPOFOL N/A 02/05/2018   Procedure: ESOPHAGOGASTRODUODENOSCOPY (EGD) WITH PROPOFOL;  Surgeon: Wyline Mood, MD;  Location: [facility] ENDOSCOPY;  Service: Gastroenterology;  Laterality: N/A;  . ESOPHAGOGASTRODUODENOSCOPY (EGD) WITH PROPOFOL N/A 03/30/2018   Procedure: ESOPHAGOGASTRODUODENOSCOPY (EGD) WITH PROPOFOL WITH DILATION;  Surgeon: Wyline Mood, MD;  Location: [facility] ENDOSCOPY;  Service: Gastroenterology;  Laterality: N/A;    Allergies: No Known Allergies  Home Medications: (Not in a hospital admission)  Home medication reconciliation was completed with the patient.   Scheduled Inpatient Medications:   . amLODipine  10 mg Oral Daily  . atorvastatin  20 mg Oral QHS  . [START ON 08/09/2019] ferrous sulfate  325 mg Oral Q breakfast  . lactulose  20 g Oral TID  . pantoprazole  40 mg Oral BID AC  . polyethylene glycol  17 g Oral Daily  . polyethylene glycol-electrolytes  4,000 mL Oral Once    Continuous Inpatient Infusions:    PRN Inpatient Medications:  acetaminophen **OR** acetaminophen, ondansetron **OR** ondansetron (ZOFRAN) IV, traMADol  Family History: family history includes Diabetes in his mother; Heart disease in his father; Prostate cancer in his brother.  The patient's family history is negative for inflammatory bowel disorders, GI malignancy, or solid organ transplantation.  Social History:   reports that he has quit smoking. He  has never used smokeless tobacco. He reports that he does not drink alcohol or use drugs. The patient denies ETOH, tobacco, or drug use.   Review of Systems: Constitutional: Weight is stable.  Eyes: No changes in vision. ENT: No oral lesions, sore throat.  GI: see HPI.  Heme/Lymph: No easy bruising.  CV: No chest pain.  GU: No hematuria.  Integumentary: No rashes.  Neuro: No  headaches.  Psych: No depression/anxiety.  Endocrine: No heat/cold intolerance.  Allergic/Immunologic: No urticaria.  Resp: No cough, SOB.  Musculoskeletal: No joint swelling.    Physical Examination: BP (!) 163/76   Pulse 63   Temp 98.1 F (36.7 C) (Oral)   Resp 14   Ht 6' (1.829 m)   Wt 81.6 kg   SpO2 100%   BMI 24.41 kg/m   Pleasant, well-appearing male in ED stretcher. Wife present in room. Gen: NAD, alert and oriented x 4 HEENT: PEERLA, EOMI, Neck: supple, no JVD or thyromegaly Chest: CTA bilaterally, no wheezes, crackles, or other adventitious sounds CV: RRR, no m/g/c/r Abd: soft, NT, ND, +BS in all four quadrants; no HSM, guarding, ridigity, or rebound tenderness Ext: no edema, well perfused with 2+ pulses, Skin: no rash or lesions noted Lymph: no LAD  Data: Lab Results  Component Value Date   WBC 10.6 (H) 08/08/2019   HGB 8.7 (L) 08/08/2019   HCT 28.9 (L) 08/08/2019   MCV 81.9 08/08/2019   PLT 209 08/08/2019   Recent Labs  Lab 08/08/19 0803 08/08/19 1418  HGB 6.4* 8.7*   Lab Results  Component Value Date   NA 137 08/08/2019   K 3.8 08/08/2019   CL 104 08/08/2019   CO2 23 08/08/2019   BUN 23 08/08/2019   CREATININE 1.14 08/08/2019   Lab Results  Component Value Date   ALT 15 08/08/2019   AST 24 08/08/2019   ALKPHOS 57 08/08/2019   BILITOT 0.6 08/08/2019   Recent Labs  Lab 08/08/19 0803  APTT 30  INR 1.1   Assessment/Plan:  68 y/o AA male with a PMH of CKD Stage III, chronic pain, HTN, OA, Hx of chronic anemia admitted to the hospital for acute blood loss anemia  1. Acute blood loss anemia  2. Painless hematochezia 3. Sigmoid diverticulosis 4. Hx of GI bleeding 2/2 diverticular bleed  COVID-19 test - NEGATIVE  - Pt presented to the ED today for one-week history of painless hematochezia. Lab work-up shows evidence of acute blood loss anemia and hemoglobin precipitously dropped to 6.4. He is s/p 2 units of pRBCs and repeat CBC shows  improvement to 8.7. - Differential includes diverticular bleed, AVMs, colon polyp, Meckel's diverticulum, malignancy, anal outlet etiology, or possibly upper GI source though unlikely given symptomology and history points to lower GI source. - Continue to monitor H&H. Transfuse for Hgb <8.0. - Agree with acid suppression with Protonix - Avoid NSAIDs - No signs of active GI bleeding. If there is hematochezia today, would advise tagged RBC scan to identify active bleed and consult vascular if positive - Plan for EGD and colonoscopy with Dr. Norma Fredricksonoledo tomorrow - Clear liquid diet today. NPO after midnight. Pt will complete Trilyte bowel prep starting 1700 today. - See procedure note for findings and further recommendations. Consider capsule if EGD/colon are neg   I reviewed the risks (including bleeding, perforation, infection, anesthesia complications, cardiac/respiratory complications), benefits and alternatives of EGD and colonoscopy. Patient consents to proceed.     Thank you for the consult. Please call with  questions or concerns.  Deven Audi M Jeancarlos Marchena, PA-C [facility] [phone] (Cell)  

## 2019-08-08 NOTE — ED Provider Notes (Signed)
[facility] Emergency Department Provider Note  ____________________________________________   First MD Initiated Contact with Patient 08/08/19 [phone]     (approximate)  I have reviewed the triage vital signs and the nursing notes.   HISTORY  Chief Complaint Abdominal Pain    HPI Caleb Edwards is a 68 y.o. male with diverticulitis who comes in with lower abdominal pain and bloody diarrhea for the past week.  Patient states that he had some constipation and took a laxative and then afterwards he started having bloody diarrhea.  Is been going on for the past 1 week.  Has been intermittent, nothing makes better, nothing makes it worse.  He has had blood in his stool before secondary to diverticulosis.  He has required blood transfusions.  He is here to have his blood levels evaluated to see if he needs a transfusion.  He denies any abdominal pain at this time.  Denies any fevers.       Past Medical History:  Diagnosis Date  . Back pain   . Blood transfusion without reported diagnosis 12/2015   bleeding ulcer hospitalization.   . Diverticulitis   . Hypertension   . Ulcer, stomach peptic, chronic     Patient Active Problem List   Diagnosis Date Noted  . Pharmacologic therapy 06/04/2019  . Disorder of skeletal system 06/04/2019  . Problems influencing health status 06/04/2019  . Chronic hip pain (Right) 06/04/2019  . Grade 1 Anterolisthesis (7-74mm) of L5 over S1 06/13/2018  . DDD (degenerative disc disease), lumbosacral 06/13/2018  . Lumbar facet arthropathy 06/13/2018  . HTN (hypertension) 02/03/2018  . CKD (chronic kidney disease), stage III 02/03/2018  . Rectal bleeding 01/23/2018  . Blood in stool   . GI bleed 07/31/2017  . Acute blood loss anemia 07/31/2017  . Abdominal pain 07/31/2017  . Near syncope 07/31/2017  . Chronic pain syndrome 09/09/2016  . Vitamin D insufficiency 01/07/2016  . Symptomatic anemia 01/02/2016  . Long term current  use of opiate analgesic 09/24/2015  . Long term prescription opiate use 09/24/2015  . Opiate use 09/24/2015  . Encounter for therapeutic drug level monitoring 09/24/2015  . Encounter for chronic pain management 09/24/2015  . Insomnia secondary to chronic pain 09/24/2015  . Osteoarthritis 09/24/2015  . Chronic low back pain (Primary Area of Pain) (Bilateral) (R>L) 09/24/2015  . Lumbar spondylosis 09/24/2015  . Chronic lower extremity pain (Secondary area of Pain) (Left) 09/24/2015  . 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 Lumbar facet syndrome (Bilateral) (R>L) 09/24/2015  . Diffuse myofascial pain syndrome 09/24/2015  . L4-5 disc bulge 09/24/2015  . 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 Chronic neck pain 09/24/2015  . Cervical spondylosis 09/24/2015    Past Surgical History:  Procedure Laterality Date  . COLONOSCOPY WITH PROPOFOL N/A 08/02/2017   Procedure: COLONOSCOPY WITH PROPOFOL;  Surgeon: Midge Minium, MD;  Location: [facility] ENDOSCOPY;  Service: Endoscopy;  Laterality: N/A;  . COLONOSCOPY WITH PROPOFOL N/A 02/05/2018   Procedure: COLONOSCOPY WITH PROPOFOL;  Surgeon: Wyline Mood, MD;  Location: [facility] ENDOSCOPY;  Service: Gastroenterology;  Laterality: N/A;  . ESOPHAGOGASTRODUODENOSCOPY (EGD) WITH PROPOFOL N/A 01/03/2016   Procedure: ESOPHAGOGASTRODUODENOSCOPY (EGD) WITH PROPOFOL;  Surgeon: Scot Jun, MD;  Location: [facility] ENDOSCOPY;  Service: Endoscopy;  Laterality: N/A;  . ESOPHAGOGASTRODUODENOSCOPY (EGD) WITH PROPOFOL  08/02/2017   Procedure: ESOPHAGOGASTRODUODENOSCOPY (EGD) WITH PROPOFOL;  Surgeon: Midge Minium, MD;  Location: [facility] ENDOSCOPY;  Service: Endoscopy;;  . ESOPHAGOGASTRODUODENOSCOPY (EGD) WITH PROPOFOL  N/A 02/05/2018   Procedure: ESOPHAGOGASTRODUODENOSCOPY (EGD) WITH PROPOFOL;  Surgeon: Jonathon Bellows, MD;  Location: [facility] ENDOSCOPY;  Service: Gastroenterology;  Laterality: N/A;  . ESOPHAGOGASTRODUODENOSCOPY (EGD) WITH  PROPOFOL N/A 03/30/2018   Procedure: ESOPHAGOGASTRODUODENOSCOPY (EGD) WITH PROPOFOL WITH DILATION;  Surgeon: Jonathon Bellows, MD;  Location: [facility] ENDOSCOPY;  Service: Gastroenterology;  Laterality: N/A;    Prior to Admission medications   Medication Sig Start Date End Date Taking? Authorizing Provider  amLODipine (NORVASC) 10 MG tablet Take 1 tablet by mouth daily. 12/31/17   [provider]  atorvastatin (LIPITOR) 20 MG tablet Take 1 tablet by mouth at bedtime. 12/31/17   [provider]  chlorthalidone (HYGROTON) 50 MG tablet Take 1 tablet by mouth daily. 12/31/17   [provider]  ferrous sulfate 325 (65 FE) MG tablet Take 325 mg by mouth daily with breakfast.    [provider]  lactulose (KRISTALOSE) 20 g packet Take 1 packet (20 g total) by mouth 3 (three) times daily. 04/11/18   Jonathon Bellows, MD  lisinopril (PRINIVIL,ZESTRIL) 40 MG tablet Take 1 tablet by mouth daily. 12/31/17   [provider]  pantoprazole (PROTONIX) 40 MG tablet Take 1 tablet (40 mg total) by mouth 2 (two) times daily before a meal. 01/04/16   Demetrios Loll, MD  polyethylene glycol [facility] / Floria Raveling) packet Take 17 g by mouth daily. 02/06/18   Dustin Flock, MD  sildenafil (VIAGRA) 100 MG tablet Take 100 mg by mouth daily as needed for erectile dysfunction.    [provider]  traMADol (ULTRAM) 50 MG tablet Take 2 tablets (100 mg total) by mouth every 6 (six) hours as needed for severe pain. Each refill must last 30 days. 03/12/19 09/08/19  Milinda Pointer, MD    Allergies Patient has no known allergies.  Family History  Problem Relation Age of Onset  . Diabetes Mother   . Heart disease Father   . Prostate cancer Brother   . Bladder Cancer Neg Hx   . Kidney cancer Neg Hx     Social History Social History   Tobacco Use  . Smoking status: Former Research scientist (life sciences)  . Smokeless tobacco: Never Used  Substance Use Topics  . Alcohol use: No    Alcohol/week: 0.0 standard drinks  .  Drug use: No      Review of Systems Constitutional: No fever/chills Eyes: No visual changes. ENT: No sore throat. Cardiovascular: Denies chest pain. Respiratory: Denies shortness of breath. Gastrointestinal: No abdominal pain.  No nausea, no vomiting.  Blood in his stool Genitourinary: Negative for dysuria. Musculoskeletal: Negative for back pain. Skin: Negative for rash. Neurological: Negative for headaches, focal weakness or numbness. All other ROS negative ____________________________________________   PHYSICAL EXAM:  VITAL SIGNS: ED Triage Vitals  Enc Vitals Group     BP 08/08/19 0754 (!) 162/78     Pulse Rate 08/08/19 0754 91     Resp 08/08/19 0754 16     Temp 08/08/19 0754 98.4 F (36.9 C)     Temp Source 08/08/19 0754 Oral     SpO2 08/08/19 0754 99 %     Weight 08/08/19 0755 180 lb (81.6 kg)     Height 08/08/19 0755 6' (1.829 m)     Head Circumference --      Peak Flow --      Pain Score 08/08/19 0755 0     Pain Loc --      Pain Edu? --      Excl. in [place]? --  Constitutional: Alert and oriented. Well appearing and in no acute distress. Eyes: Conjunctivae are normal. EOMI. Head: Atraumatic. Nose: No congestion/rhinnorhea. Mouth/Throat: Mucous membranes are moist.   Neck: No stridor. Trachea Midline. FROM Cardiovascular: Normal rate, regular rhythm. Grossly normal heart sounds.  Good peripheral circulation. Respiratory: Normal respiratory effort.  No retractions. Lungs CTAB. Gastrointestinal: Soft and nontender. No distention. No abdominal bruits.  Musculoskeletal: No lower extremity tenderness nor edema.  No joint effusions. Neurologic:  Normal speech and language. No gross focal neurologic deficits are appreciated.  Skin:  Skin is warm, dry and intact. No rash noted. Psychiatric: Mood and affect are normal. Speech and behavior are normal. GU: No external hemorrhoids or abscesses.  Bright red blood per rectum   ____________________________________________   LABS (all labs ordered are listed, but only abnormal results are displayed)  Labs Reviewed  CBC WITH DIFFERENTIAL/PLATELET - Abnormal; Notable for the following components:      Result Value   WBC 14.1 (*)    RBC 2.83 (*)    Hemoglobin 6.4 (*)    HCT 22.4 (*)    MCV 79.2 (*)    MCH 22.6 (*)    MCHC 28.6 (*)    RDW 15.9 (*)    Neutro Abs 11.2 (*)    Monocytes Absolute 1.1 (*)    Abs Immature Granulocytes 0.09 (*)    All other components within normal limits  COMPREHENSIVE METABOLIC PANEL - Abnormal; Notable for the following components:   Glucose, Bld 108 (*)    Calcium 8.7 (*)    All other components within normal limits  URINALYSIS, ROUTINE W REFLEX MICROSCOPIC - Abnormal; Notable for the following components:   Color, Urine YELLOW (*)    APPearance CLEAR (*)    Leukocytes,Ua MODERATE (*)    All other components within normal limits  SARS CORONAVIRUS 2 BY RT PCR (HOSPITAL ORDER, PERFORMED IN [facility])  LIPASE, BLOOD  PROTIME-INR  APTT  TYPE AND SCREEN  PREPARE RBC (CROSSMATCH)   ____________________________________________      PROCEDURES  Procedure(s) performed (including Critical Care):  .Critical Care Performed by: Concha SeFunke, Mirranda Monrroy E, MD Authorized by: Concha SeFunke, Eustacio Ellen E, MD   Critical care provider statement:    Critical care time (minutes):  30   Critical care was necessary to treat or prevent imminent or life-threatening deterioration of the following conditions:  Circulatory failure   Critical care was time spent personally by me on the following activities:  Discussions with consultants, evaluation of patient's response to treatment, examination of patient, ordering and performing treatments and interventions, ordering and review of laboratory studies, ordering and review of radiographic studies, pulse oximetry, re-evaluation of patient's condition, obtaining history from patient or surrogate and  review of old charts     ____________________________________________   INITIAL IMPRESSION / ASSESSMENT AND PLAN / ED COURSE  Asa SaunasWilliam J Bartholomew was evaluated in Emergency Department on 08/08/2019 for the symptoms described in the history of present illness. He was evaluated in the context of the global COVID-19 pandemic, which necessitated consideration that the patient might be at risk for infection with the SARS-CoV-2 virus that causes COVID-19. Institutional protocols and algorithms that pertain to the evaluation of patients at risk for COVID-19 are in a state of rapid change based on information released by regulatory bodies including the CDC and federal and state organizations. These policies and algorithms were followed during the patient's care in the ED.     Patient is a well-appearing 68 year old with history  of diverticulosis who presents with rectal bleeding.  Patient's had this previously and always been secondary to his diverticulosis.  He has required transfusions in the past.  Will get labs to evaluate for AKI, electrolyte abnormalities, anemia.  Patient has no abdominal pain at this time suggest diverticulitis versus perforation.  Also very low suspicion for mesenteric ischemia given no abdominal pain.  We will continue to closely monitor patient.   Coags are normal.  White count is elevated at 14.1 but no infectious symptoms at this time.  We will hold off on antibiotics given otherwise no sepsis criteria.  Hemoglobin is down to 6.4 from 11.3 1-year ago.  Will transfuse 2 units of RBCs and discussed with hospital team for admission due to rectal bleeding with anemia.   ____________________________________________   FINAL CLINICAL IMPRESSION(S) / ED DIAGNOSES   Final diagnoses:  Rectal bleeding  Symptomatic anemia      MEDICATIONS GIVEN DURING THIS VISIT:  Medications  0.9 %  sodium chloride infusion (has no administration in time range)  pantoprazole (PROTONIX)  injection 40 mg (has no administration in time range)     ED Discharge Orders    None       Note:  This document was prepared using Dragon voice recognition software and may include unintentional dictation errors.   Concha Se, MD 08/08/19 [phone]

## 2019-08-08 NOTE — ED Notes (Signed)
Janette, RN (report given)

## 2019-08-09 ENCOUNTER — Observation Stay: Payer: Medicare HMO | Admitting: Certified Registered Nurse Anesthetist

## 2019-08-09 ENCOUNTER — Encounter: Payer: Self-pay | Admitting: Certified Registered Nurse Anesthetist

## 2019-08-09 ENCOUNTER — Encounter
Admission: EM | Disposition: A | Discharge status: Home / Self Care | Payer: Self-pay | Source: Home / Self Care | Attending: Emergency Medicine

## 2019-08-09 DIAGNOSIS — K922 Gastrointestinal hemorrhage, unspecified: Secondary | ICD-10-CM | POA: Diagnosis not present

## 2019-08-09 HISTORY — PX: COLONOSCOPY WITH PROPOFOL: SHX5780

## 2019-08-09 HISTORY — PX: ESOPHAGOGASTRODUODENOSCOPY (EGD) WITH PROPOFOL: SHX5813

## 2019-08-09 LAB — COMPREHENSIVE METABOLIC PANEL
ALT: 15 U/L (ref 0–44)
AST: 23 U/L (ref 15–41)
Albumin: 3.8 g/dL (ref 3.5–5.0)
Alkaline Phosphatase: 53 U/L (ref 38–126)
Anion gap: 7 (ref 5–15)
BUN: 14 mg/dL (ref 8–23)
CO2: 25 mmol/L (ref 22–32)
Calcium: 8.7 mg/dL — ABNORMAL LOW (ref 8.9–10.3)
Chloride: 106 mmol/L (ref 98–111)
Creatinine, Ser: 1.15 mg/dL (ref 0.61–1.24)
GFR calc Af Amer: >60 mL/min (ref >60)
GFR calc non Af Amer: >60 mL/min (ref >60)
Glucose, Bld: 104 mg/dL — ABNORMAL HIGH (ref 70–99)
Potassium: 3.9 mmol/L (ref 3.5–5.1)
Sodium: 138 mmol/L (ref 135–145)
Total Bilirubin: 0.7 mg/dL (ref 0.3–1.2)
Total Protein: 7.1 g/dL (ref 6.5–8.1)

## 2019-08-09 LAB — TYPE AND SCREEN
ABO/RH(D): O POS
Antibody Screen: NEGATIVE
Unit division: 0
Unit division: 0

## 2019-08-09 LAB — CBC WITH DIFFERENTIAL/PLATELET
Abs Immature Granulocytes: 0.02 10*3/uL (ref 0.00–0.07)
Abs Immature Granulocytes: 0.04 10*3/uL (ref 0.00–0.07)
Basophils Absolute: 0.1 10*3/uL (ref 0.0–0.1)
Basophils Absolute: 0 10*3/uL (ref 0.0–0.1)
Basophils Relative: 1 %
Basophils Relative: 1 %
Eosinophils Absolute: 0.1 10*3/uL (ref 0.0–0.5)
Eosinophils Absolute: 0.1 10*3/uL (ref 0.0–0.5)
Eosinophils Relative: 2 %
Eosinophils Relative: 1 %
HCT: 30.8 % — ABNORMAL LOW (ref 39.0–52.0)
HCT: 29.8 % — ABNORMAL LOW (ref 39.0–52.0)
Hemoglobin: 9.2 g/dL — ABNORMAL LOW (ref 13.0–17.0)
Hemoglobin: 9 g/dL — ABNORMAL LOW (ref 13.0–17.0)
Immature Granulocytes: 0 %
Immature Granulocytes: 1 %
Lymphocytes Relative: 16 %
Lymphocytes Relative: 12 %
Lymphs Abs: 1.4 10*3/uL (ref 0.7–4.0)
Lymphs Abs: 1 10*3/uL (ref 0.7–4.0)
MCH: 24.1 pg — ABNORMAL LOW (ref 26.0–34.0)
MCH: 24.5 pg — ABNORMAL LOW (ref 26.0–34.0)
MCHC: 29.9 g/dL — ABNORMAL LOW (ref 30.0–36.0)
MCHC: 30.2 g/dL (ref 30.0–36.0)
MCV: 80.8 fL (ref 80.0–100.0)
MCV: 81.2 fL (ref 80.0–100.0)
Monocytes Absolute: 1 10*3/uL (ref 0.1–1.0)
Monocytes Absolute: 1 10*3/uL (ref 0.1–1.0)
Monocytes Relative: 11 %
Monocytes Relative: 12 %
Neutro Abs: 6 10*3/uL (ref 1.7–7.7)
Neutro Abs: 5.9 10*3/uL (ref 1.7–7.7)
Neutrophils Relative %: 70 %
Neutrophils Relative %: 73 %
Platelets: 242 10*3/uL (ref 150–400)
Platelets: 243 10*3/uL (ref 150–400)
RBC: 3.81 MIL/uL — ABNORMAL LOW (ref 4.22–5.81)
RBC: 3.67 MIL/uL — ABNORMAL LOW (ref 4.22–5.81)
RDW: 16.3 % — ABNORMAL HIGH (ref 11.5–15.5)
RDW: 16 % — ABNORMAL HIGH (ref 11.5–15.5)
WBC: 8.5 10*3/uL (ref 4.0–10.5)
WBC: 8.1 10*3/uL (ref 4.0–10.5)
nRBC: 0 % (ref 0.0–0.2)
nRBC: 0 % (ref 0.0–0.2)

## 2019-08-09 LAB — BPAM RBC
Blood Product Expiration Date: 202012022359
Blood Product Expiration Date: 202012022359
ISSUE DATE / TIME: 202011040903
ISSUE DATE / TIME: 202011041037
Unit Type and Rh: 5100
Unit Type and Rh: 5100

## 2019-08-09 LAB — HIV ANTIBODY (ROUTINE TESTING W REFLEX): HIV Screen 4th Generation wRfx: NONREACTIVE

## 2019-08-09 SURGERY — ESOPHAGOGASTRODUODENOSCOPY (EGD) WITH PROPOFOL
Anesthesia: General

## 2019-08-09 MED ORDER — LIDOCAINE HCL (CARDIAC) PF 100 MG/5ML IV SOSY
PREFILLED_SYRINGE | INTRAVENOUS | Status: DC | PRN
  Administered 2019-08-09: 50 mg via INTRAVENOUS

## 2019-08-09 MED ORDER — LIDOCAINE HCL (PF) 2 % IJ SOLN
INTRAMUSCULAR | Status: AC
  Filled 2019-08-09: qty 10

## 2019-08-09 MED ORDER — SODIUM CHLORIDE 0.9 % IV SOLN
INTRAVENOUS | Status: DC
  Administered 2019-08-09: 12:00:00 via INTRAVENOUS

## 2019-08-09 MED ORDER — PROPOFOL 10 MG/ML IV BOLUS
INTRAVENOUS | Status: DC | PRN
  Administered 2019-08-09: 20 mg via INTRAVENOUS
  Administered 2019-08-09: 80 mg via INTRAVENOUS
  Administered 2019-08-09: 20 mg via INTRAVENOUS

## 2019-08-09 MED ORDER — PHENYLEPHRINE HCL (PRESSORS) 10 MG/ML IV SOLN
INTRAVENOUS | Status: DC | PRN
  Administered 2019-08-09: 100 ug via INTRAVENOUS

## 2019-08-09 MED ORDER — PROPOFOL 500 MG/50ML IV EMUL
INTRAVENOUS | Status: DC | PRN
  Administered 2019-08-09: 130 ug/kg/min via INTRAVENOUS

## 2019-08-09 MED ORDER — PROPOFOL 500 MG/50ML IV EMUL
INTRAVENOUS | Status: AC
  Filled 2019-08-09: qty 50

## 2019-08-09 MED ORDER — CHLORTHALIDONE 25 MG PO TABS
50.0000 mg | ORAL_TABLET | Freq: Every day | ORAL | Status: DC
  Administered 2019-08-09: 50 mg via ORAL
  Filled 2019-08-09: qty 2

## 2019-08-09 MED ORDER — HYDRALAZINE HCL 20 MG/ML IJ SOLN: 10.0000 mg | Freq: Four times a day (QID) | INTRAMUSCULAR | Status: DC | PRN

## 2019-08-09 MED ORDER — LISINOPRIL 20 MG PO TABS
40.0000 mg | ORAL_TABLET | Freq: Every day | ORAL | Status: DC
  Administered 2019-08-09: 40 mg via ORAL
  Filled 2019-08-09: qty 2

## 2019-08-09 NOTE — Anesthesia Post-op Follow-up Note (Signed)
Anesthesia QCDR form completed.        

## 2019-08-09 NOTE — Transfer of Care (Signed)
Immediate Anesthesia Transfer of Care Note  Patient: Caleb Edwards  Procedure(s) Performed: ESOPHAGOGASTRODUODENOSCOPY (EGD) WITH PROPOFOL (N/A ) COLONOSCOPY WITH PROPOFOL (N/A )  Patient Location: PACU  Anesthesia Type:General  Level of Consciousness: drowsy  Airway & Oxygen Therapy: Patient Spontanous Breathing  Post-op Assessment: Report given to RN and Post -op Vital signs reviewed and stable  Post vital signs: Reviewed and stable  Last Vitals:  Vitals Value Taken Time  BP 80/52 08/09/19 1244  Temp    Pulse 61 08/09/19 1245  Resp 19 08/09/19 1245  SpO2 99 % 08/09/19 1245  Vitals shown include unvalidated device data.  Last Pain:  Vitals:   08/09/19 1143  TempSrc: Tympanic  PainSc: 0-No pain         Complications: No apparent anesthesia complications

## 2019-08-09 NOTE — Op Note (Signed)
[facility] Gastroenterology Patient Name: Caleb Edwards Procedure Date: 08/09/2019 12:10 PM MRN: [id number] Account #: [id number] Date of Birth: 25-May-1951 Admit Type: Inpatient Age: 68 [facility] Gender: Male Note Status: Finalized Procedure:             Colonoscopy Indications:           Hematochezia, Acute post hemorrhagic anemia Providers:             Benay Pike. Alice Reichert MD, MD Referring MD:          No Local Md, MD (Referring MD) Medicines:             Propofol per Anesthesia Complications:         No immediate complications. Procedure:             Pre-Anesthesia Assessment:                        - The risks and benefits of the procedure and the                         sedation options and risks were discussed with the                         patient. All questions were answered and informed                         consent was obtained.                        - Patient identification and proposed procedure were                         verified prior to the procedure by the nurse. The                         procedure was verified in the procedure room.                        - ASA Grade Assessment: III - A patient with severe                         systemic disease.                        - After reviewing the risks and benefits, the patient                         was deemed in satisfactory condition to undergo the                         procedure.                        After obtaining informed consent, the colonoscope was                         passed under direct vision. Throughout the procedure,                         the patient's blood pressure,  pulse, and oxygen                         saturations were monitored continuously. The                         Colonoscope was introduced through the anus and                         advanced to the the cecum, identified by appendiceal                         orifice and ileocecal valve. The  colonoscopy was                         performed without difficulty. The patient tolerated                         the procedure well. The quality of the bowel                         preparation was excellent. The ileocecal valve,                         appendiceal orifice, and rectum were photographed. Findings:      The perianal and digital rectal examinations were normal. Pertinent       negatives include normal sphincter tone and no palpable rectal lesions.      Multiple small and large-mouthed diverticula were found in the entire       colon.      Non-bleeding internal hemorrhoids were found during retroflexion. The       hemorrhoids were Grade I (internal hemorrhoids that do not prolapse).       Estimated blood loss: none.      Hematin (altered blood/coffee-ground-like material) was found in the       sigmoid colon.      There is no endoscopic evidence of bleeding, erythema, inflammation,       mass or polyps in the entire colon.      The exam was otherwise without abnormality. Impression:            - Diverticulosis in the entire examined colon.                        - Non-bleeding internal hemorrhoids.                        - Blood in the sigmoid colon.                        - The examination was otherwise normal.                        - No specimens collected. Recommendation:        - Return patient to hospital ward for possible                         discharge same day.                        - Advance diet  as tolerated.                        - Consider referral to colorectal surgeon for elective                         colectomy given recurrent diverticular bleeds.                        - The findings and recommendations were discussed with                         the patient. Procedure Code(s):     --- Professional ---                        [phone], Colonoscopy, flexible; diagnostic, including                         collection of specimen(s) by brushing or  washing, when                         performed (separate procedure) Diagnosis Code(s):     --- Professional ---                        K57.30, Diverticulosis of large intestine without                         perforation or abscess without bleeding                        D62, Acute posthemorrhagic anemia                        K92.1, Melena (includes Hematochezia)                        K92.2, Gastrointestinal hemorrhage, unspecified                        K64.0, First degree hemorrhoids CPT copyright 2019 American Medical Association. All rights reserved. The codes documented in this report are preliminary and upon coder review may  be revised to meet current compliance requirements. Stanton Kidney MD, MD 08/09/2019 12:42:40 PM This report has been signed electronically. Number of Addenda: 0 Note Initiated On: 08/09/2019 12:10 PM Scope Withdrawal Time: 0 hours 6 minutes 32 seconds  Total Procedure Duration: 0 hours 10 minutes 14 seconds  Estimated Blood Loss:  Estimated blood loss: none.      [facility]

## 2019-08-09 NOTE — Discharge Summary (Signed)
Physician Discharge Summary  Caleb Edwards UTM:[id number] DOB: 11-16-1950 DOA: 08/08/2019  [facility]  Admit date: 08/08/2019 Discharge date: 08/09/2019  Admitted From: home Discharge disposition: home   Code Status: Full Code  Diet Recommendation: cardiac   Recommendations for Outpatient Follow-Up:   1. F/u with PCP 2. Referral given for colorectal surgery evaluation.   Discharge Diagnosis:   Principal Problem:   Lower GI bleed    History of Present Illness / Brief narrative:  Patient is a 68 year old male with history of HTN, GI bleed from gastric ulcer, diverticulosis.   He presented to the ED on 11/4 with complaint of lower GI bleeding for 1 week.   No fever, vomiting, abdominal pain.    In the ED, he was found to have a hemoglobin dropped to 6.4, baseline 11.3 from 02/2018.  He was admitted to hospitalist medicine service.  He received 2 units of PRBCs overnight. He was seen by GI this morning.  He underwent EGD and colonoscopy.  Findings as below.  Hospital Course:  Acute GI bleeding -History of GI bleeding from gastric ulcers as well as diverticulosis. -Underwent EGD and colonoscopy today. -EGD was essentially normal with widely patent Schatzki's ring, normal stoma, normal duodenum. -Colonoscopy showed diverticulosis in the entire colon, nonbleeding internal hemorrhoids, blood in the sigmoid colon.  Findings suggestive of diverticular bleed which has now stopped. -GI recommended to consider referral to colorectal surgeon for elective colectomy because of his recurrent diverticular bleeds.  Acute blood loss anemia -Hemoglobin low at 6.4 at presentation.  2 units of PRBCs transfused.  Hemoglobin improved to 9.2 this morning.   Essential hypertension. -Home meds include amlodipine, chlorthalidone and lisinopril.   -Meds were on hold this morning because of n.p.o. status.  Blood pressure was trending high prior to procedure.  Postprocedure,  blood pressure was low but eventually improved. Resume medicines post discharge.  Chronic constipation. Patient is on lactulose as well as MiraLAX on a regular basis. We will continue the same.  Dyslipidemia. Continue Lipitor.  Chronic back pain. Continue tramadol. PDMP reviewed. Patient should continue to take his stool softener along with his chronic tramadol.   Subjective:  Patient was seen and examined this morning prior to procedure.  Caucasian male.  Not in distress.  No abdominal pain at the time of my evaluation. Seen post procedure as well. Feels better and wants to go home.   Discharge Exam:   Vitals:   08/09/19 0931 08/09/19 1143 08/09/19 1244 08/09/19 1559  BP: (!) 154/84 (!) 145/84 (!) 87/59 140/66  Pulse: 78 77 62 82  Resp: 20 20 16    Temp: 98.6 F (37 C) 99.5 F (37.5 C) (!) 97.5 F (36.4 C) 99.3 F (37.4 C)  TempSrc: Oral Tympanic Skin Oral  SpO2: 100% 100% 100% 98%  Weight:  81.6 kg    Height:  6' (1.829 m)      Body mass index is 24.41 kg/m.  General exam: Appears calm and comfortable.  Skin: No rashes, lesions or ulcers. HEENT: Atraumatic, normocephalic, supple neck, no obvious bleeding Lungs: Clear to auscultation bilaterally CVS: Regular rate and rhythm, no murmur GI/Abd soft, nondistended, nontender, bowel sound present CNS: Alert, awake, oriented x3 Psychiatry: Mood appropriate Extremities: No pedal edema, no calf tenderness  Discharge Instructions:  Wound care: none Discharge Instructions    Ambulatory referral to General Surgery   Complete by: As directed    Recurrent diverticular bleed.   Increase activity slowly  Complete by: As directed       Allergies as of 08/09/2019   No Known Allergies     Medication List    TAKE these medications   amLODipine 10 MG tablet Commonly known as: NORVASC Take 1 tablet by mouth daily.   atorvastatin 20 MG tablet Commonly known as: LIPITOR Take 1 tablet by mouth at bedtime.    chlorthalidone 50 MG tablet Commonly known as: HYGROTON Take 1 tablet by mouth daily.   ferrous sulfate 325 (65 FE) MG tablet Take 325 mg by mouth daily with breakfast.   lactulose 20 g packet Commonly known as: Kristalose Take 1 packet (20 g total) by mouth 3 (three) times daily.   lisinopril 40 MG tablet Commonly known as: ZESTRIL Take 1 tablet by mouth daily.   pantoprazole 40 MG tablet Commonly known as: PROTONIX Take 1 tablet (40 mg total) by mouth 2 (two) times daily before a meal.   polyethylene glycol 17 g packet Commonly known as: MIRALAX / GLYCOLAX Take 17 g by mouth daily.   sildenafil 100 MG tablet Commonly known as: VIAGRA Take 100 mg by mouth daily as needed for erectile dysfunction.   traMADol 50 MG tablet Commonly known as: ULTRAM Take 2 tablets (100 mg total) by mouth every 6 (six) hours as needed for severe pain. Each refill must last 30 days.       Time coordinating discharge: 35 minutes  The results of significant diagnostics from this hospitalization (including imaging, microbiology, ancillary and laboratory) are listed below for reference.    Procedures and Diagnostic Studies:   No results found.   Labs:   Basic Metabolic Panel: Recent Labs  Lab 08/08/19 0803 08/09/19 0606  NA 137 138  K 3.8 3.9  CL 104 106  CO2 23 25  GLUCOSE 108* 104*  BUN 23 14  CREATININE 1.14 1.15  CALCIUM 8.7* 8.7*   GFR Estimated Creatinine Clearance: 69.4 mL/min (by C-G formula based on SCr of 1.15 mg/dL). Liver Function Tests: Recent Labs  Lab 08/08/19 0803 08/09/19 0606  AST 24 23  ALT 15 15  ALKPHOS 57 53  BILITOT 0.6 0.7  PROT 6.9 7.1  ALBUMIN 3.7 3.8   Recent Labs  Lab 08/08/19 0803  LIPASE 17   No results for input(s): AMMONIA in the last 168 hours. Coagulation profile Recent Labs  Lab 08/08/19 0803  INR 1.1    CBC: Recent Labs  Lab 08/08/19 0803 08/08/19 1418 08/08/19 1806 08/09/19 0606  WBC 14.1* 10.6* 10.3 8.5   NEUTROABS 11.2* 8.1* 7.7 6.0  HGB 6.4* 8.7* 8.5* 9.2*  HCT 22.4* 28.9* 28.0* 30.8*  MCV 79.2* 81.9 81.2 80.8  PLT 254 209 209 242   Cardiac Enzymes: No results for input(s): CKTOTAL, CKMB, CKMBINDEX, TROPONINI in the last 168 hours. BNP: Invalid input(s): POCBNP CBG: No results for input(s): GLUCAP in the last 168 hours. D-Dimer No results for input(s): DDIMER in the last 72 hours. Hgb A1c No results for input(s): HGBA1C in the last 72 hours. Lipid Profile No results for input(s): CHOL, HDL, LDLCALC, TRIG, CHOLHDL, LDLDIRECT in the last 72 hours. Thyroid function studies No results for input(s): TSH, T4TOTAL, T3FREE, THYROIDAB in the last 72 hours.  Invalid input(s): FREET3 Anemia work up No results for input(s): VITAMINB12, FOLATE, FERRITIN, TIBC, IRON, RETICCTPCT in the last 72 hours. Microbiology Recent Results (from the past 240 hour(s))  SARS Coronavirus 2 by RT PCR (hospital order, [facility]) Nasopharyngeal Nasopharyngeal Swab  Status: None   Collection Time: 08/08/19  8:51 AM   Specimen: Nasopharyngeal Swab  Result Value Ref Range Status   SARS Coronavirus 2 NEGATIVE NEGATIVE Final    Comment: (NOTE) If result is NEGATIVE SARS-CoV-2 target nucleic acids are NOT DETECTED. The SARS-CoV-2 RNA is generally detectable in upper and lower  respiratory specimens during the acute phase of infection. The lowest  concentration of SARS-CoV-2 viral copies this assay can detect is 250  copies / mL. A negative result does not preclude SARS-CoV-2 infection  and should not be used as the sole basis for treatment or other  patient management decisions.  A negative result may occur with  improper specimen collection / handling, submission of specimen other  than nasopharyngeal swab, presence of viral mutation(s) within the  areas targeted by this assay, and inadequate number of viral copies  (<250 copies / mL). A negative result must be combined with  clinical  observations, patient history, and epidemiological information. If result is POSITIVE SARS-CoV-2 target nucleic acids are DETECTED. The SARS-CoV-2 RNA is generally detectable in upper and lower  respiratory specimens dur ing the acute phase of infection.  Positive  results are indicative of active infection with SARS-CoV-2.  Clinical  correlation with patient history and other diagnostic information is  necessary to determine patient infection status.  Positive results do  not rule out bacterial infection or co-infection with other viruses. If result is PRESUMPTIVE POSTIVE SARS-CoV-2 nucleic acids MAY BE PRESENT.   A presumptive positive result was obtained on the submitted specimen  and confirmed on repeat testing.  While 2019 novel coronavirus  (SARS-CoV-2) nucleic acids may be present in the submitted sample  additional confirmatory testing may be necessary for epidemiological  and / or clinical management purposes  to differentiate between  SARS-CoV-2 and other Sarbecovirus currently known to infect humans.  If clinically indicated additional testing with an alternate test  methodology [id number]) is advised. The SARS-CoV-2 RNA is generally  detectable in upper and lower respiratory sp ecimens during the acute  phase of infection. The expected result is Negative. Fact Sheet for Patients:  StrictlyIdeas.no Fact Sheet for Healthcare Providers: BankingDealers.co.za This test is not yet approved or cleared by the [place] FDA and has been authorized for detection and/or diagnosis of SARS-CoV-2 by FDA under an Emergency Use Authorization (EUA).  This EUA will remain in effect (meaning this test can be used) for the duration of the COVID-19 declaration under Section 564(b)(1) of the Act, 21 U.S.C. section 360bbb-3(b)(1), unless the authorization is terminated or revoked sooner. Performed at [facility], [address], [place], [place] [address]     Please note: You were cared for by a hospitalist during your hospital stay. Once you are discharged, your primary care physician will handle any further medical issues. Please note that NO REFILLS for any discharge medications will be authorized once you are discharged, as it is imperative that you return to your primary care physician (or establish a relationship with a primary care physician if you do not have one) for your post hospital discharge needs so that they can reassess your need for medications and monitor your lab values.  Signed: Terrilee Croak  [facility] 08/09/2019, 4:07 PM

## 2019-08-09 NOTE — Anesthesia Postprocedure Evaluation (Signed)
Anesthesia Post Note  Patient: Caleb Edwards  Procedure(s) Performed: ESOPHAGOGASTRODUODENOSCOPY (EGD) WITH PROPOFOL (N/A ) COLONOSCOPY WITH PROPOFOL (N/A )  Patient location during evaluation: Endoscopy Anesthesia Type: General Level of consciousness: awake and alert Pain management: pain level controlled Vital Signs Assessment: post-procedure vital signs reviewed and stable Respiratory status: spontaneous breathing, nonlabored ventilation, respiratory function stable and patient connected to nasal cannula oxygen Cardiovascular status: blood pressure returned to baseline and stable Postop Assessment: no apparent nausea or vomiting Anesthetic complications: no     Last Vitals:  Vitals:   08/09/19 1143 08/09/19 1244  BP: (!) 145/84 (!) 87/59  Pulse: 77 62  Resp: 20 16  Temp: 37.5 C (!) 36.4 C  SpO2: 100% 100%    Last Pain:  Vitals:   08/09/19 1304  TempSrc:   PainSc: 0-No pain                 Martha Clan

## 2019-08-09 NOTE — Op Note (Signed)
[facility] Gastroenterology Patient Name: Caleb Edwards Procedure Date: 08/09/2019 12:11 PM MRN: [id number] Account #: [id number] Date of Birth: 28-Jun-1951 Admit Type: Inpatient Age: 68 Room: [facility] ENDO ROOM 2 Gender: Male Note Status: Finalized Procedure:             Upper GI endoscopy Indications:           Acute post hemorrhagic anemia Providers:             Sagona Nearing. Norma Fredrickson MD, MD Referring MD:          No Local Md, MD (Referring MD) Medicines:             Propofol per Anesthesia Complications:         No immediate complications. Procedure:             Pre-Anesthesia Assessment:                        - The risks and benefits of the procedure and the                         sedation options and risks were discussed with the                         patient. All questions were answered and informed                         consent was obtained.                        - Patient identification and proposed procedure were                         verified prior to the procedure by the nurse. The                         procedure was verified in the procedure room.                        - ASA Grade Assessment: III - A patient with severe                         systemic disease.                        - After reviewing the risks and benefits, the patient                         was deemed in satisfactory condition to undergo the                         procedure.                        After obtaining informed consent, the endoscope was                         passed under direct vision. Throughout the procedure,                         the patient's blood  pressure, pulse, and oxygen                         saturations were monitored continuously. The Endoscope                         was introduced through the mouth, and advanced to the                         third part of duodenum. The upper GI endoscopy was                         accomplished without  difficulty. The patient tolerated                         the procedure well. Findings:      A widely patent Schatzki ring was found in the distal esophagus.      The stomach was normal.      The examined duodenum was normal. Impression:            - Widely patent Schatzki ring.                        - Normal stomach.                        - Normal examined duodenum.                        - No specimens collected. Recommendation:        - Proceed with colonoscopy Procedure Code(s):     --- Professional ---                        [phone], Esophagogastroduodenoscopy, flexible,                         transoral; diagnostic, including collection of                         specimen(s) by brushing or washing, when performed                         (separate procedure) Diagnosis Code(s):     --- Professional ---                        D62, Acute posthemorrhagic anemia                        K22.2, Esophageal obstruction CPT copyright 2019 American Medical Association. All rights reserved. The codes documented in this report are preliminary and upon coder review may  be revised to meet current compliance requirements. Efrain Sella MD, MD 08/09/2019 12:24:40 PM This report has been signed electronically. Number of Addenda: 0 Note Initiated On: 08/09/2019 12:11 PM Estimated Blood Loss:  Estimated blood loss: none.      [facility]

## 2019-08-09 NOTE — Progress Notes (Signed)
Caleb Edwards  A and O x 4 VSS. Pt tolerating diet well. No complaints of pain or nausea. IV removed intact, prescriptions given. Pt voices understanding of discharge instructions with no further questions. Pt discharged via wheelchair with axillary.   Allergies as of 08/09/2019   No Known Allergies     Medication List    TAKE these medications   amLODipine 10 MG tablet Commonly known as: NORVASC Take 1 tablet by mouth daily.   atorvastatin 20 MG tablet Commonly known as: LIPITOR Take 1 tablet by mouth at bedtime.   chlorthalidone 50 MG tablet Commonly known as: HYGROTON Take 1 tablet by mouth daily.   ferrous sulfate 325 (65 FE) MG tablet Take 325 mg by mouth daily with breakfast.   lactulose 20 g packet Commonly known as: Kristalose Take 1 packet (20 g total) by mouth 3 (three) times daily.   lisinopril 40 MG tablet Commonly known as: ZESTRIL Take 1 tablet by mouth daily.   pantoprazole 40 MG tablet Commonly known as: PROTONIX Take 1 tablet (40 mg total) by mouth 2 (two) times daily before a meal.   polyethylene glycol 17 g packet Commonly known as: MIRALAX / GLYCOLAX Take 17 g by mouth daily.   sildenafil 100 MG tablet Commonly known as: VIAGRA Take 100 mg by mouth daily as needed for erectile dysfunction.   traMADol 50 MG tablet Commonly known as: ULTRAM Take 2 tablets (100 mg total) by mouth every 6 (six) hours as needed for severe pain. Each refill must last 30 days.       Vitals:   08/09/19 1244 08/09/19 1559  BP: (!) 87/59 140/66  Pulse: 62 82  Resp: 16   Temp: (!) 97.5 F (36.4 C) 99.3 F (37.4 C)  SpO2: 100% 98%    Cayman Kielbasa Payton Mccallum

## 2019-08-09 NOTE — Interval H&P Note (Signed)
History and Physical Interval Note:  08/09/2019 11:38 AM  Caleb Edwards  has presented today for surgery, with the diagnosis of Acute blood loss anemia, Hematochezia.  The various methods of treatment have been discussed with the patient and family. After consideration of risks, benefits and other options for treatment, the patient has consented to  Procedure(s): ESOPHAGOGASTRODUODENOSCOPY (EGD) WITH PROPOFOL (N/A) COLONOSCOPY WITH PROPOFOL (N/A) as a surgical intervention.  The patient's history has been reviewed, patient examined, no change in status, stable for surgery.  I have reviewed the patient's chart and labs.  Questions were answered to the patient's satisfaction.     [place], [place]

## 2019-08-09 NOTE — Anesthesia Postprocedure Evaluation (Deleted)
Anesthesia Post Note  Patient: Caleb Edwards  Procedure(s) Performed: ESOPHAGOGASTRODUODENOSCOPY (EGD) WITH PROPOFOL (N/A ) COLONOSCOPY WITH PROPOFOL (N/A )  Patient location during evaluation: Endoscopy Anesthesia Type: General Level of consciousness: awake and alert Pain management: pain level controlled Vital Signs Assessment: post-procedure vital signs reviewed and stable Respiratory status: spontaneous breathing, nonlabored ventilation, respiratory function stable and patient connected to nasal cannula oxygen Cardiovascular status: blood pressure returned to baseline and stable Postop Assessment: no apparent nausea or vomiting Anesthetic complications: no     Last Vitals:  Vitals:   08/09/19 1143 08/09/19 1244  BP: (!) 145/84 (!) 87/59  Pulse: 77 62  Resp: 20 16  Temp: 37.5 C (!) 36.4 C  SpO2: 100% 100%    Last Pain:  Vitals:   08/09/19 1304  TempSrc:   PainSc: 0-No pain                 Osborne Serio     

## 2019-08-09 NOTE — Addendum Note (Signed)
Addendum  created 08/09/19 1307 by Martha Clan, MD   Clinical Note Signed, Delete clinical note

## 2019-08-09 NOTE — Anesthesia Preprocedure Evaluation (Signed)
Anesthesia Evaluation  Patient identified by MRN, date of birth, ID band Patient awake    Reviewed: Allergy & Precautions, NPO status , Patient's Chart, lab work & pertinent test results, reviewed documented beta blocker date and time   History of Anesthesia Complications Negative for: history of anesthetic complications  Airway Mallampati: II  TM Distance: >3 FB     Dental  (+) Chipped, Upper Dentures, Partial Lower, Dental Advidsory Given, Poor Dentition   Pulmonary neg pulmonary ROS, former smoker,           Cardiovascular Exercise Tolerance: Good hypertension, Pt. on medications (-) angina(-) CAD, (-) Past MI, (-) Cardiac Stents and (-) CABG (-) dysrhythmias (-) Valvular Problems/Murmurs     Neuro/Psych neg Seizures  Neuromuscular disease negative psych ROS   GI/Hepatic Neg liver ROS, PUD, GERD  ,  Endo/Other  negative endocrine ROS  Renal/GU CRFRenal disease     Musculoskeletal  (+) Arthritis ,   Abdominal   Peds  Hematology  (+) Blood dyscrasia, anemia ,   Anesthesia Other Findings Past Medical History: No date: Back pain 12/2015: Blood transfusion without reported diagnosis     Comment:  bleeding ulcer hospitalization.  No date: Diverticulitis No date: Hypertension No date: Ulcer, stomach peptic, chronic   Reproductive/Obstetrics                             Anesthesia Physical  Anesthesia Plan  ASA: III  Anesthesia Plan: General   Post-op Pain Management:    Induction: Intravenous  PONV Risk Score and Plan: 2 and Propofol infusion and TIVA  Airway Management Planned: Nasal Cannula and Natural Airway  Additional Equipment:   Intra-op Plan:   Post-operative Plan:   Informed Consent: I have reviewed the patients History and Physical, chart, labs and discussed the procedure including the risks, benefits and alternatives for the proposed anesthesia with the patient or  authorized representative who has indicated his/her understanding and acceptance.       Plan Discussed with: CRNA  Anesthesia Plan Comments:         Anesthesia Quick Evaluation

## 2019-08-10 ENCOUNTER — Encounter: Payer: Self-pay | Admitting: Internal Medicine

## 2019-08-23 ENCOUNTER — Encounter: Payer: Self-pay | Admitting: Pain Medicine

## 2019-08-26 NOTE — Progress Notes (Signed)
Pain Management Virtual Encounter Note - Virtual Visit via Telephone Telehealth (real-time audio visits between healthcare provider and patient).   Patient's Phone No. & Preferred Pharmacy:  [phone] (home); [phone] (mobile); (Preferred) [phone] No e-mail address on record  [facility] [address] - [place] - [phone] [place] [id number] Phone: [phone] Fax: [phone]    Pre-screening note:  Our staff contacted [name] and offered him an "in person", "face-to-face" appointment versus a telephone encounter. He indicated preferring the telephone encounter, at this time.   Reason for Virtual Visit: COVID-19*  Social distancing based on CDC and AMA recommendations.   I contacted Caleb SaunasWilliam J Edwards on 08/27/2019 via telephone.      I clearly identified myself as Caleb DoneFrancisco A Alaisa Moffitt, MD. I verified that I was speaking with the correct person using two identifiers (Name: Caleb Edwards, and date of birth: 11/18/1950).  Advanced Informed Consent I sought verbal advanced consent from Caleb SaunasWilliam J Gapinski for virtual visit interactions. I informed [name] of possible security and privacy concerns, risks, and limitations associated with providing "not-in-person" medical evaluation and management services. I also informed [name] of the availability of "in-person" appointments. Finally, I informed him that there would be a charge for the virtual visit and that he could be  personally, fully or partially, financially responsible for it. [name] expressed understanding and agreed to proceed.   Historic Elements   Mr. Caleb SaunasWilliam J Haigler is a 68 y.o. year old, male patient evaluated today after his last encounter by our practice on 07/11/2019. [name]  has a past medical history of Back pain, Blood transfusion without reported diagnosis (12/2015), Diverticulitis, Hypertension, and Ulcer, stomach peptic, chronic. He  also  has a past surgical history that includes Esophagogastroduodenoscopy (egd) with propofol (N/A, 01/03/2016); Colonoscopy with propofol (N/A, 08/02/2017); Esophagogastroduodenoscopy (egd) with propofol (08/02/2017); Esophagogastroduodenoscopy (egd) with propofol (N/A, 02/05/2018); Colonoscopy with propofol (N/A, 02/05/2018); Esophagogastroduodenoscopy (egd) with propofol (N/A, 03/30/2018); Esophagogastroduodenoscopy (egd) with propofol (N/A, 08/09/2019); and Colonoscopy with propofol (N/A, 08/09/2019). [name] has a current medication list which includes the following prescription(s): amlodipine, atorvastatin, chlorthalidone, ferrous sulfate, lisinopril, pantoprazole, polyethylene glycol, sildenafil, and tramadol. He  reports that he has quit smoking. He has never used smokeless tobacco. He reports that he does not drink alcohol or use drugs. [name] has No Known Allergies.   HPI  Today, he is being contacted for medication management.  The patient indicates doing well with the current medication regimen. No adverse reactions or side effects reported to the medications.   Pharmacotherapy Assessment  Analgesic: Tramadol 50 mg, 2 tabs PO q 6 hrs (400 mg/day of tramadol) MME/day:40 mg/day.   Monitoring: Pharmacotherapy: No side-effects or adverse reactions reported.  PMP: PDMP reviewed during this encounter.       Compliance: No problems identified. Effectiveness: Clinically acceptable. Plan: Refer to "POC".  UDS:  Summary  Date Value Ref Range Status  03/24/2017 FINAL  Final    Comment:    ==================================================================== TOXASSURE SELECT 13 (MW) ==================================================================== Test                             Result       Flag       Units Drug Present   Tramadol                       PRESENT   O-Desmethyltramadol  PRESENT   N-Desmethyltramadol            PRESENT    Source of tramadol is a prescription  medication.    O-desmethyltramadol and N-desmethyltramadol are expected    metabolites of tramadol. ==================================================================== Test                      Result    Flag   Units      Ref Range   Creatinine              125              mg/dL      >=08 ==================================================================== Declared Medications:  Medication list was not provided. ==================================================================== For clinical consultation, please call [phone]. ====================================================================    Laboratory Chemistry Profile (12 mo)  Renal: 08/09/2019: BUN 14; Creatinine, Ser 1.15  Lab Results  Component Value Date   GFRAA >60 08/09/2019   GFRNONAA >60 08/09/2019   Hepatic: 08/09/2019: Albumin 3.8 Lab Results  Component Value Date   AST 23 08/09/2019   ALT 15 08/09/2019   Other: No results found for requested labs within last 8760 hours. Note: Above Lab results reviewed.  Imaging  Last 90 days:  Dg Lumbar Spine Complete W/bend  Result Date: 07/12/2019 CLINICAL DATA:  Low back pain right-sided with right hip pain. No injury. EXAM: LUMBAR SPINE - COMPLETE WITH BENDING VIEWS COMPARISON:  09/16/2016 FINDINGS: Slight curvature of the lumbar spine convex right without significant change. Stable grade 1 anterolisthesis of L5 on S1. Vertebral body heights are maintained. There is mild to moderate spondylosis of the lumbar spine. Disc space narrowing throughout the lumbar spine with sparing of the L1-2 level. No evidence of instability on flexion and extension. Mild stable T12 compression deformity. IMPRESSION: No acute findings. 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 Stable grade 1 anterolisthesis of L5 on S1 without instability. Stable mild T12 compression deformity. Electronically Signed   By: Elberta Fortis M.D.   On: 07/12/2019 21:37    Dg Pain Clinic C-arm 1-60 Min No Report  Result Date: 06/21/2019 Fluoro was used, but no Radiologist interpretation will be provided. Please refer to "NOTES" tab for provider progress note.  Dg Hip Unilat W Or W/o Pelvis 2-3 Views Right  Result Date: 07/12/2019 CLINICAL DATA:  Right low back and hip pain.  No injury. EXAM: DG HIP (WITH OR WITHOUT PELVIS) 2-3V RIGHT COMPARISON:  None. FINDINGS: Mild symmetric degenerative change of the hips. No acute fracture or dislocation. No focal lytic or sclerotic lesion. Degenerative change of the spine and sacroiliac joints. IMPRESSION: No acute findings. 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 Electronically Signed   By: Elberta Fortis M.D.   On: 07/12/2019 21:38    Assessment  The primary encounter diagnosis was Chronic pain syndrome. Diagnoses of Chronic low back pain (Primary Area of Pain) (Bilateral) (R>L), Chronic lower extremity pain (Secondary area of Pain) (Left), and Pharmacologic therapy were also pertinent to this visit.  Plan of Care  I have discontinued Anselm Pancoast. Mirante's lactulose. I am also having him maintain his pantoprazole, chlorthalidone, amLODipine, atorvastatin, lisinopril, ferrous sulfate, sildenafil, polyethylene glycol, and traMADol.  Pharmacotherapy (Medications Ordered): Meds ordered this encounter  Medications  . traMADol (ULTRAM) 50 MG tablet    Sig: Take 2 tablets (100 mg total) by mouth every 6 (six) hours as needed for severe pain. Each refill must last 30 days.  Dispense:  240 tablet    Refill:  5    Chronic Pain: STOP Act - Not applicable. Fill 1 day early if closed on scheduled refill date. Do not fill until: 09/08/2019. To last until: 03/06/2020. Instruct to avoid benzodiazepines within 8 hours of opioid.   Orders:  Orders Placed This Encounter  Procedures  . UDS (Compliance-13) ([facility]) ([facility]) (Established Pt.)    Volume: 30 ml(s). Minimum 3 ml of urine is needed. Document temperature of fresh  sample. Indications: Long term (current) use of opiate analgesic (T65.465)   Follow-up plan:   Return in about 6 months (around 03/06/2020) for (VV), (MM) and to review UDS.      Interventional treatment options:  Under consideration:   Diagnostic right IA hip joint injection  Diagnostic bilateral lumbar facet block  Possible bilateral lumbar facet RFA    Therapeutic/palliative (PRN):   Palliative left L3-4 LESI #3  Palliative left L4 TFESI #2  Palliative right L3 TFESI #2  Palliative right L4 TFESI #3     Recent Visits Date Type Provider Dept  07/11/19 Office Visit Milinda Pointer, MD [facility]  06/21/19 Procedure visit Milinda Pointer, MD [facility]  06/04/19 Office Visit Milinda Pointer, MD [facility]  Showing recent visits within past 90 days and meeting all other requirements   Today's Visits Date Type Provider Dept  08/27/19 Telemedicine Milinda Pointer, MD [facility]  Showing today's visits and meeting all other requirements   Future Appointments No visits were found meeting these conditions.  Showing future appointments within next 90 days and meeting all other requirements   I discussed the assessment and treatment plan with the patient. The patient was provided an opportunity to ask questions and all were answered. The patient agreed with the plan and demonstrated an understanding of the instructions.  Patient advised to call back or seek an in-person evaluation if the symptoms or condition worsens.  Total duration of non-face-to-face encounter: 13 minutes.  Note by: Gaspar Cola, MD Date: 08/27/2019; Time: 9:17 AM  Note: This dictation was prepared with Dragon dictation. Any transcriptional errors that may result from this process are unintentional.  Disclaimer:  * Given the special circumstances of the COVID-19 pandemic, the federal government has announced that the Office for Civil Rights (OCR)  will exercise its enforcement discretion and will not impose penalties on physicians using telehealth in the event of noncompliance with regulatory requirements under the [place] (HIPAA) in connection with the good faith provision of telehealth during the KPTWS-56 national public health emergency. ([place])

## 2019-08-27 ENCOUNTER — Other Ambulatory Visit: Payer: Self-pay

## 2019-08-27 ENCOUNTER — Ambulatory Visit: Payer: Medicare HMO | Attending: Pain Medicine | Admitting: Pain Medicine

## 2019-08-27 DIAGNOSIS — M79605 Pain in left leg: Secondary | ICD-10-CM

## 2019-08-27 DIAGNOSIS — Z79899 Other long term (current) drug therapy: Secondary | ICD-10-CM

## 2019-08-27 DIAGNOSIS — M5442 Lumbago with sciatica, left side: Secondary | ICD-10-CM

## 2019-08-27 DIAGNOSIS — G894 Chronic pain syndrome: Secondary | ICD-10-CM | POA: Diagnosis not present

## 2019-08-27 DIAGNOSIS — G8929 Other chronic pain: Secondary | ICD-10-CM

## 2019-08-27 MED ORDER — TRAMADOL HCL 50 MG PO TABS: 100.0000 mg | ORAL_TABLET | Freq: Four times a day (QID) | ORAL | 5 refills | Status: DC | PRN

## 2019-09-12 ENCOUNTER — Ambulatory Visit: Payer: Medicare HMO | Admitting: Pain Medicine

## 2019-12-27 ENCOUNTER — Encounter: Payer: Self-pay | Admitting: Student

## 2019-12-31 ENCOUNTER — Other Ambulatory Visit: Payer: Self-pay

## 2019-12-31 ENCOUNTER — Ambulatory Visit (INDEPENDENT_AMBULATORY_CARE_PROVIDER_SITE_OTHER): Payer: Medicare HMO | Admitting: Gastroenterology

## 2019-12-31 VITALS — BP 182/94 | HR 102 | Temp 98.2°F | Ht 72.0 in | Wt 177.8 lb

## 2019-12-31 DIAGNOSIS — K625 Hemorrhage of anus and rectum: Secondary | ICD-10-CM

## 2019-12-31 DIAGNOSIS — D508 Other iron deficiency anemias: Secondary | ICD-10-CM

## 2019-12-31 DIAGNOSIS — K315 Obstruction of duodenum: Secondary | ICD-10-CM | POA: Diagnosis not present

## 2019-12-31 NOTE — Progress Notes (Signed)
Wyline Mood MD, MRCP(U.K) [address]  [address]  [place], [place] [address]  Main: [phone]  Fax: [phone]   Primary Care Physician: Antonieta Loveless, MD  Primary Gastroenterologist:  Dr. Halford Decamp follow-up for rectal bleeding   HPI: Caleb Edwards is a 69 y.o. male    Summary of history :  He was last seen at my office in May 2019.  Heunderwent an upper endoscopy and colonoscopy in 2018.  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  The colonoscopy demonstrated pancolonic diverticulosis.  In April 2019 admitted with a diverticular bleed, subsequently readmitted in May 2019 with similar issues.  I performed an EGD and colonoscopy at the same time.The upper endoscopy demonstrated a benign-appearing severe stenosis in the third portion of the duodenum which I could not transverse.  I also performed a colonoscopy which showed diverticulosis throughout the colon with internal hemorrhoids that were nonbleeding and medium in size.  At the time of his discharge his hemoglobin was 9 g with an MCV of 93.9 and I felt that his bleeding again was from diverticular disease as there was no microcytosis to indicate a chronic iron deficiency anemia.  Upper endoscopy was repeated in June 2019 and a duodenal stenosis was dilated to 12 mm  Interval history 2019-12/31/2019  He was admitted in November 2020 for lower GI bleed and secondary anemia.  EGD was normal and colonoscopy showed diverticulosis of the entire colon.  Nonbleeding internal hemorrhoids.  Impression was it was a diverticular bleed.  Plan at discharge was to refer to a surgeon as an outpatient to evaluate for colectomy. The iron levels are slightly low which is probably related to the large bleed that she had when he came into the hospital 10/11/2019: Hemoglobin 12.5 g with an MCV of 78, creatinine 1.2  Presented to the emergency room at [facility] on 12/10/2019 with rectal bleeding given  IV iron and discharged hemoglobin when checked on 12/11/2019 was 8.5 g g with an MCV of 86.1.  Normal folate and B12.  Ferritin was 10.   Since [facility] visit he has had further episode of blood mixed with the stools. Current Outpatient Medications  Medication Sig Dispense Refill  . amLODipine (NORVASC) 10 MG tablet Take 1 tablet by mouth daily.  3  . atorvastatin (LIPITOR) 20 MG tablet Take 1 tablet by mouth at bedtime.  3  . chlorthalidone (HYGROTON) 50 MG tablet Take 1 tablet by mouth daily.  3  . ferrous sulfate 325 (65 FE) MG tablet Take 325 mg by mouth daily with breakfast.    . lisinopril (PRINIVIL,ZESTRIL) 40 MG tablet Take 1 tablet by mouth daily.  3  . pantoprazole (PROTONIX) 40 MG tablet Take 1 tablet (40 mg total) by mouth 2 (two) times daily before a meal. 60 tablet 0  . polyethylene glycol (MIRALAX / GLYCOLAX) packet Take 17 g by mouth daily. 30 each 0  . sildenafil (VIAGRA) 100 MG tablet Take 100 mg by mouth daily as needed for erectile dysfunction.    . traMADol (ULTRAM) 50 MG tablet Take 2 tablets (100 mg total) by mouth every 6 (six) hours as needed for severe pain. Each refill must last 30 days. 240 tablet 5   No current facility-administered medications for this visit.    Allergies as of 12/31/2019  . (No Known Allergies)    ROS:  General: Negative for anorexia, weight loss, fever, chills, fatigue, weakness. ENT: Negative for hoarseness, difficulty  swallowing , nasal congestion. CV: Negative for chest pain, angina, palpitations, dyspnea on exertion, peripheral edema.  Respiratory: Negative for dyspnea at rest, dyspnea on exertion, cough, sputum, wheezing.  GI: See history of present illness. GU:  Negative for dysuria, hematuria, urinary incontinence, urinary frequency, nocturnal urination.  Endo: Negative for unusual weight change.    Physical Examination:   There were no vitals taken for this visit.  General: Well-nourished, well-developed in no acute distress.    Eyes: No icterus. Conjunctivae pink. Neuro: Alert and oriented x 3.  Grossly intact. Skin: Warm and dry, no jaundice.   Psych: Alert and cooperative, normal mood and affect.   Imaging Studies: No results found.  Assessment and Plan:   Caleb Edwards is a 69 y.o. y/o male has been admitted to the hospital on multiple occasions for the past 2 to 3 years with hematochezia.  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  Multiple endoscopies which have only shown a stricture but no other causes of anemia except diverticulosis and internal hemorrhoids.  So far the episodes of bleeding had been attributed to diverticulosis and most recently during his hospital admission discussion was had to consider colectomy as an outpatient.  He has never had a capsule study of the small bowel.  I will plan to perform a capsule study of the small bowel to rule out any bleeds in the small bowel in addition we will rule out Meckel's diverticulum as a cause..  Plan  1.  IV iron: Will refer to Dr. Rogue Bussing in hematology   2.  Refer to Dr. Dahlia Byes  to discuss about total colectomy  3.  Capsule study of the small bowel to be performed endoscopically since he has a history of a duodenal stricture I want to ensure that there is no stricture where the capsule can get stuck.  I will get a upper GI series with small bowel follow-through to be performed before the capsule study to ensure there are no other strictures of the small bowel              4.  Meckel scan to rule out Meckel's diverticulum              5.  EGD to be performed after upper GI series along with capsule study of small bowel  I have discussed alternative options, risks & benefits,  which include, but are not limited to, bleeding, infection, perforation,respiratory complication & drug reaction.  The patient agrees with this plan & written consent will be obtained.     Risks, benefits, alternatives of Givens capsule discussed  with patient to include but not limited to the rare risk of Given's capsule becoming lodged in the GI tract requiring surgical removal.  The patient agrees with this plan & consent will be obtained.     Dr Jonathon Bellows  MD,MRCP [facility]) Follow up in 8 to 12 weeks

## 2020-01-01 ENCOUNTER — Telehealth: Payer: Self-pay

## 2020-01-01 NOTE — Telephone Encounter (Signed)
Patient has been informed of Imaging Appt for Upper GI and Meckles Scan.  Scheduled for Tuesday April 1st at [facility].  Patient has been asked to arrive at 9am to [facility] for Registration.  Advised nothing to eat or drink after midnight.  Patient expressed understanding, and made note of appt.  Thanks,  [place], [place]

## 2020-01-03 ENCOUNTER — Telehealth: Payer: Self-pay

## 2020-01-03 NOTE — Telephone Encounter (Signed)
Patient arrived at the [facility] this am for his Imaging as asked, but they did not have him down for today. Contacted scheduling to confirm the date and they have him down for 01/08/20 arrive at 9am.  Apologized for the inconvenience this has caused him this morning.  Thanks,  [place], [place]

## 2020-01-07 ENCOUNTER — Ambulatory Visit (INDEPENDENT_AMBULATORY_CARE_PROVIDER_SITE_OTHER): Payer: Medicare HMO | Admitting: Surgery

## 2020-01-07 ENCOUNTER — Other Ambulatory Visit: Payer: Self-pay

## 2020-01-07 ENCOUNTER — Encounter: Payer: Self-pay | Admitting: Surgery

## 2020-01-07 VITALS — BP 173/82 | HR 87 | Temp 95.2°F | Ht 72.0 in | Wt 183.0 lb

## 2020-01-07 DIAGNOSIS — K922 Gastrointestinal hemorrhage, unspecified: Secondary | ICD-10-CM | POA: Diagnosis not present

## 2020-01-07 NOTE — Patient Instructions (Addendum)
Dr.Pabon discussed with patient that he would like to wait until after patient has the imaging/scans done tomorrow to follow up and discuss the next recommended steps. Dr.Pabon would like to follow up with patient in three weeks.  Laparoscopic Colectomy Laparoscopic colectomy is surgery to remove part or all of the large intestine (colon). This procedure may be used to treat several conditions, including:  Inflammation and infection of the colon (diverticulitis).  Tumors or masses in the colon.  Inflammatory bowel disease, such as Crohn disease or ulcerative colitis. Colectomy is an option when symptoms cannot be controlled with medicines.  Bleeding from the colon that cannot be controlled by another method.  Blockage or obstruction of the colon. Tell a health care provider about:  Any allergies you have.  All medicines you are taking, including vitamins, herbs, eye drops, creams, and over-the-counter medicines.  Any problems you or family members have had with anesthetic medicines.  Any blood disorders you have.  Any surgeries you have had.  Any medical conditions you have. What are the risks? Generally, this is a safe procedure. However, problems may occur, including:  Infection.  Bleeding.  Allergic reactions to medicines or dyes.  Damage to other structures or organs.  Leaking from where the colon was sewn together.  Future blockage of the small intestines from scar tissue. Another surgery may be needed to repair this.  Needing to convert to an open procedure. Complications such as damage to other organs or excessive bleeding may require the surgeon to convert from a laparoscopic procedure to an open procedure. This involves making a larger incision in the abdomen. What happens before the procedure? Staying hydrated Follow instructions from your health care provider about hydration, which may include:  Up to 2 hours before the procedure - you may continue to drink  clear liquids, such as water, clear fruit juice, black coffee, and plain tea. Eating and drinking restrictions Follow instructions from your health care provider about eating and drinking, which may include:  8 hours before the procedure - stop eating heavy meals, meals with high fiber, or foods such as meat, fried foods, or fatty foods.  6 hours before the procedure - stop eating light meals or foods, such as toast or cereal.  6 hours before the procedure - stop drinking milk or drinks that contain milk.  2 hours before the procedure - stop drinking clear liquids. Medicines  Ask your health care provider about: ? Changing or stopping your regular medicines. This is especially important if you are taking diabetes medicines or blood thinners. ? Taking medicines such as aspirin and ibuprofen. These medicines can thin your blood. Do not take these medicines before your procedure if your health care provider instructs you not to.  You may be given antibiotic medicine to clean out bacteria from your colon. Follow the directions carefully and take the medicine at the correct time. General instructions  You may be prescribed an oral bowel prep to clean out your colon in preparation for the surgery: ? Follow instructions from your health care provider about how to do this. ? Do not eat or drink anything else after you have started the bowel prep, unless your health care provider tells you it is safe to do so.  Do not use any products that contain nicotine or tobacco, such as cigarettes and e-cigarettes. If you need help quitting, ask your health care provider. What happens during the procedure?  To reduce your risk of infection: ? Your health  care team will wash or sanitize their hands. ? Your skin will be washed with soap.  An IV tube will be inserted into one of your veins to deliver fluid and medication.  You will be given one of the following: ? A medicine to help you relax (sedative).  ? A medicine to make you fall asleep (general anesthetic).  Small monitors will be connected to your body. They will be used to check your heart, blood pressure, and oxygen level.  A breathing tube may be placed into your lungs during the procedure.  A thin, flexible tube (catheter) will be placed into your bladder to drain urine.  A tube may be placed through your nose and into your stomach to drain stomach fluids (nasogastric tube, or NG tube).  Your abdomen will be filled with air so it expands. This gives the surgeon more room to operate and makes your organs easier to see.  Several small cuts (incisions) will be made in your abdomen.  A thin, lighted tube with a tiny camera on the end (laparoscope) will be put through one of the small incisions. The camera on the laparoscope will send a picture to a computer screen in the operating room. This will give the surgeon a good view inside your abdomen.  Hollow tubes will be put through the other small incisions in your abdomen. The tools that are needed for the procedure will be put through these tubes.  Clamps or staples will be put on both ends of the diseased part of the colon.  The part of the intestine between the clamps or staples will be removed.  If possible, the ends of the healthy colon that remain will be stitched (sutured) or stapled together to allow your body to pass waste (stool).  Sometimes, the remaining colon cannot be stitched back together. If this is the case, a colostomy will be needed. If you need a colostomy: ? An opening to the outside of your body (stoma) will be made through your abdomen. ? The end of your colon will be brought to the opening. It will be stitched to the skin. ? A bag will be attached to the opening. Stool will drain into this removable bag. ? The colostomy may be temporary or permanent.  The incisions from the colectomy will be closed with sutures or staples. The procedure may vary among  health care providers and hospitals. What happens after the procedure?  Your blood pressure, heart rate, breathing rate, and blood oxygen level will be monitored until the medicines you were given have worn off.  You will receive fluids through an IV tube until your bowels start to work properly.  Once your bowels are working again, you will be given clear liquids first and then solid food as tolerated.  You will be given medicines to control your pain and nausea, if needed.  Do not drive for 24 hours if you were given a sedative. This information is not intended to replace advice given to you by your health care provider. Make sure you discuss any questions you have with your health care provider. Document Revised: 09/02/2017 Document Reviewed: 06/21/2016 Elsevier Patient Education  2020 [employer].

## 2020-01-08 ENCOUNTER — Other Ambulatory Visit: Payer: Self-pay

## 2020-01-08 ENCOUNTER — Encounter
Admission: RE | Admit: 2020-01-08 | Discharge: 2020-01-08 | Disposition: A | Discharge status: Home / Self Care | Payer: Medicare HMO | Source: Ambulatory Visit | Attending: Gastroenterology | Admitting: Gastroenterology

## 2020-01-08 ENCOUNTER — Inpatient Hospital Stay: Payer: Medicare HMO | Admitting: Internal Medicine

## 2020-01-08 ENCOUNTER — Encounter: Payer: Self-pay | Admitting: Gastroenterology

## 2020-01-08 ENCOUNTER — Inpatient Hospital Stay: Payer: Medicare HMO

## 2020-01-08 ENCOUNTER — Ambulatory Visit
Admission: RE | Admit: 2020-01-08 | Discharge: 2020-01-08 | Disposition: A | Discharge status: Home / Self Care | Payer: Medicare HMO | Source: Ambulatory Visit | Attending: Gastroenterology | Admitting: Gastroenterology

## 2020-01-08 DIAGNOSIS — K625 Hemorrhage of anus and rectum: Secondary | ICD-10-CM | POA: Diagnosis not present

## 2020-01-08 DIAGNOSIS — K315 Obstruction of duodenum: Secondary | ICD-10-CM

## 2020-01-08 DIAGNOSIS — D500 Iron deficiency anemia secondary to blood loss (chronic): Secondary | ICD-10-CM | POA: Insufficient documentation

## 2020-01-08 MED ORDER — SODIUM PERTECHNETATE TC 99M INJECTION
10.0000 | Freq: Once | INTRAVENOUS | Status: AC | PRN
  Administered 2020-01-08: 10.123 via INTRAVENOUS

## 2020-01-08 NOTE — Progress Notes (Deleted)
[facility] CONSULT NOTE  Patient Care Team: Duffy, Feliz Beam, MD as PCP - General (Student)  CHIEF COMPLAINTS/PURPOSE OF CONSULTATION:    HEMATOLOGY HISTORY  # ANEMIA EGD-; colonoscopy- ; capsule-? Bone marrow Biopsy-?  HISTORY OF PRESENTING ILLNESS:  Caleb Edwards 69 y.o.  male has been referred to [place] for further evaluation/work-up for anemia.   Blood in stools: None Change in bowel habits- None Blood in urine: None Difficulty swallowing: None Abnormal weight loss: None Iron supplementation: Prior Blood transfusions:   Vaginal bleeding: None   ROS  MEDICAL HISTORY:  Past Medical History:  Diagnosis Date  . Back pain   . Blood transfusion without reported diagnosis 12/2015   bleeding ulcer hospitalization.   . Diverticulitis   . Hypertension   . Ulcer, stomach peptic, chronic     SURGICAL HISTORY: Past Surgical History:  Procedure Laterality Date  . COLONOSCOPY WITH PROPOFOL N/A 08/02/2017   Procedure: COLONOSCOPY WITH PROPOFOL;  Surgeon: Lucilla Lame, MD;  Location: [facility] ENDOSCOPY;  Service: Endoscopy;  Laterality: N/A;  . COLONOSCOPY WITH PROPOFOL N/A 02/05/2018   Procedure: COLONOSCOPY WITH PROPOFOL;  Surgeon: Jonathon Bellows, MD;  Location: [facility] ENDOSCOPY;  Service: Gastroenterology;  Laterality: N/A;  . COLONOSCOPY WITH PROPOFOL N/A 08/09/2019   Procedure: COLONOSCOPY WITH PROPOFOL;  Surgeon: Toledo, Benay Pike, MD;  Location: [facility] ENDOSCOPY;  Service: Gastroenterology;  Laterality: N/A;  . ESOPHAGOGASTRODUODENOSCOPY (EGD) WITH PROPOFOL N/A 01/03/2016   Procedure: ESOPHAGOGASTRODUODENOSCOPY (EGD) WITH PROPOFOL;  Surgeon: Manya Silvas, MD;  Location: [facility] ENDOSCOPY;  Service: Endoscopy;  Laterality: N/A;  . ESOPHAGOGASTRODUODENOSCOPY (EGD) WITH PROPOFOL  08/02/2017   Procedure: ESOPHAGOGASTRODUODENOSCOPY (EGD) WITH PROPOFOL;  Surgeon: Lucilla Lame, MD;  Location: [facility] ENDOSCOPY;  Service: Endoscopy;;  . ESOPHAGOGASTRODUODENOSCOPY (EGD) WITH PROPOFOL  N/A 02/05/2018   Procedure: ESOPHAGOGASTRODUODENOSCOPY (EGD) WITH PROPOFOL;  Surgeon: Jonathon Bellows, MD;  Location: [facility] ENDOSCOPY;  Service: Gastroenterology;  Laterality: N/A;  . ESOPHAGOGASTRODUODENOSCOPY (EGD) WITH PROPOFOL N/A 03/30/2018   Procedure: ESOPHAGOGASTRODUODENOSCOPY (EGD) WITH PROPOFOL WITH DILATION;  Surgeon: Jonathon Bellows, MD;  Location: [facility] ENDOSCOPY;  Service: Gastroenterology;  Laterality: N/A;  . ESOPHAGOGASTRODUODENOSCOPY (EGD) WITH PROPOFOL N/A 08/09/2019   Procedure: ESOPHAGOGASTRODUODENOSCOPY (EGD) WITH PROPOFOL;  Surgeon: Toledo, Benay Pike, MD;  Location: [facility] ENDOSCOPY;  Service: Gastroenterology;  Laterality: N/A;    SOCIAL HISTORY: Social History   Socioeconomic History  . Marital status: Married    Spouse name: Not on file  . Number of children: Not on file  . Years of education: Not on file  . Highest education level: Not on file  Occupational History  . Not on file  Tobacco Use  . Smoking status: Former Research scientist (life sciences)  . Smokeless tobacco: Never Used  Substance and Sexual Activity  . Alcohol use: No    Alcohol/week: 0.0 standard drinks  . Drug use: No  . Sexual activity: Yes  Other Topics Concern  . Not on file  Social History Narrative  . Not on file   Social Determinants of Health   Financial Resource Strain:   . Difficulty of Paying Living Expenses:   Food Insecurity:   . Worried About Charity fundraiser in the Last Year:   . Arboriculturist in the Last Year:   Transportation Needs:   . Film/video editor (Medical):   Marland Kitchen Lack of Transportation (Non-Medical):   Physical Activity:   . Days of Exercise per Week:   . Minutes of Exercise per Session:   Stress:   . Feeling of Stress :  Social Connections:   . Frequency of Communication with Friends and Family:   . Frequency of Social Gatherings with Friends and Family:   . Attends Religious Services:   . Active Member of Clubs or Organizations:   . Attends Archivist Meetings:   Marland Kitchen  Marital Status:   Intimate Partner Violence:   . Fear of Current or Ex-Partner:   . Emotionally Abused:   Marland Kitchen Physically Abused:   . Sexually Abused:     FAMILY HISTORY: Family History  Problem Relation Age of Onset  . Diabetes Mother   . Heart disease Father   . Prostate cancer Brother   . Bladder Cancer Neg Hx   . Kidney cancer Neg Hx     ALLERGIES:  has No Known Allergies.  MEDICATIONS:  Current Outpatient Medications  Medication Sig Dispense Refill  . amLODipine (NORVASC) 10 MG tablet Take 1 tablet by mouth daily.  3  . atorvastatin (LIPITOR) 20 MG tablet Take 1 tablet by mouth at bedtime.  3  . chlorthalidone (HYGROTON) 50 MG tablet Take 1 tablet by mouth daily.  3  . ferrous sulfate 325 (65 FE) MG tablet Take 325 mg by mouth daily with breakfast.    . lisinopril (PRINIVIL,ZESTRIL) 40 MG tablet Take 1 tablet by mouth daily.  3  . pantoprazole (PROTONIX) 40 MG tablet Take 1 tablet (40 mg total) by mouth 2 (two) times daily before a meal. 60 tablet 0  . polyethylene glycol (MIRALAX / GLYCOLAX) packet Take 17 g by mouth daily. 30 each 0  . sildenafil (VIAGRA) 100 MG tablet Take 100 mg by mouth daily as needed for erectile dysfunction.    . traMADol (ULTRAM) 50 MG tablet Take 2 tablets (100 mg total) by mouth every 6 (six) hours as needed for severe pain. Each refill must last 30 days. 240 tablet 5   No current facility-administered medications for this visit.      PHYSICAL EXAMINATION:   There were no vitals filed for this visit. There were no vitals filed for this visit.  Physical Exam  LABORATORY DATA:  I have reviewed the data as listed Lab Results  Component Value Date   WBC 8.1 08/09/2019   HGB 9.0 (L) 08/09/2019   HCT 29.8 (L) 08/09/2019   MCV 81.2 08/09/2019   PLT 243 08/09/2019   Recent Labs    08/08/19 0803 08/09/19 0606  NA 137 138  K 3.8 3.9  CL 104 106  CO2 23 25  GLUCOSE 108* 104*  BUN 23 14  CREATININE 1.14 1.15  CALCIUM 8.7* 8.7*   GFRNONAA >60 >60  GFRAA >60 >60  PROT 6.9 7.1  ALBUMIN 3.7 3.8  AST 24 23  ALT 15 15  ALKPHOS 57 53  BILITOT 0.6 0.7     NM Bowel Img Meckels  Result Date: 01/08/2020 CLINICAL DATA:  Rectal bleeding question Meckel's diverticulum EXAM: NUCLEAR MEDICINE MECKELS SCAN TECHNIQUE: Sequential abdominal images were obtained following intravenous injection of radiopharmaceutical. RADIOPHARMACEUTICALS:  10.123 mCi Tc-91mpertechnetate IV COMPARISON:  CT abdomen pelvis 01/25/2018 FINDINGS: Expected localization of mid pertechnetate within gastric mucosa. Excreted tracer into first and second portions of duodenum. No abnormal gastrointestinal localization of tracer seen to suggest Meckel's diverticulum. Excreted tracer within urinary bladder. IMPRESSION: Negative Meckel's scan Electronically Signed   By: MLavonia DanaM.D.   On: 01/08/2020 11:12   DG UGI W SMALL BOWEL  Result Date: 01/08/2020 CLINICAL DATA:  History of a duodenal stricture, requires capsule study,  eval for strictures in the proximal and rest of the small bowel ,before to perform a capsule study of the small bowel EXAM: UPPER GI SERIES WITH KUB TECHNIQUE: After obtaining a scout radiograph a routine upper GI series was performed using thin and high density barium. FLUOROSCOPY TIME:  Fluoroscopy Time:  0.5 minute Radiation Exposure Index (if provided by the fluoroscopic device): 24.4 mGy Number of Acquired Spot Images: 0 COMPARISON:  None. FINDINGS: Examination of the esophagus demonstrated normal esophageal motility. Normal esophageal morphology without evidence of esophagitis or ulceration. No esophageal stricture, diverticula, or mass lesion. No evidence of hiatal hernia. Mild gastroesophageal reflux. Examination of the stomach demonstrated normal rugal folds and areae gastricae. The gastric mucosa appeared unremarkable without evidence of ulceration, scarring, or mass lesion. Gastric motility and emptying was normal. Fluoroscopic examination  of the duodenum demonstrates normal motility and morphology without evidence of ulceration or mass lesion. Medium density barium was periodically observed under fluoroscopy to travel from the stomach to the ascending colon (over a 45 minute time period). There is no evidence of small bowel stricture or obstruction. No large filling defects to suggest mass lesion. In addition, there is no evidence of tethering or definite inflammatory changes present within the small bowel. IMPRESSION: 1. No esophageal stricture or ulceration. 2. Mild gastroesophageal reflux. 3. Normal upper GI and small bowel follow-through. Electronically Signed   By: Kathreen Devoid   On: 01/08/2020 12:24    No problem-specific Assessment & Plan notes found for this encounter.    All questions were answered. The patient knows to call the clinic with any problems, questions or concerns.      Cammie Sickle, MD 01/08/2020 1:10 PM

## 2020-01-09 NOTE — Progress Notes (Signed)
Surgical Consultation  01/09/2020  Caleb Edwards is an 69 y.o. male.   Chief Complaint  Patient presents with  . New Patient (Initial Visit)    new pt ref Dr.Anna discuss total colectomy     HPI: 69 year old male seen at the request of Dr. Tobi Bastos for recurrent lower GI bleed presumed to be from diverticulosis.  Patient has had multiple hospitalization and a couple of blood transfusion due to lower GI bleed.  I have personally reviewed the images to include a CT scan of the abdomen and pelvis revealing diverticulosis as well as a colonoscopy revealing evidence of diverticulosis without an active bleed and in one of the scopes there was some blood throughout the whole colon. He reports that he feels well and denies any recent episode of hematochezia or melena.  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  Recent hospitalization was at [facility] a few weeks ago with rectal bleeding and he was discharged home after iron infusion. She is feeling well denies any abdominal pain no recent melena.  No fevers no chills no shortness of breath.  He does have a history of chronic pain.  Past Medical History:  Diagnosis Date  . Back pain   . Blood transfusion without reported diagnosis 12/2015   bleeding ulcer hospitalization.   . Diverticulitis   . Hypertension   . Ulcer, stomach peptic, chronic     Past Surgical History:  Procedure Laterality Date  . COLONOSCOPY WITH PROPOFOL N/A 08/02/2017   Procedure: COLONOSCOPY WITH PROPOFOL;  Surgeon: Midge Minium, MD;  Location: [facility] ENDOSCOPY;  Service: Endoscopy;  Laterality: N/A;  . COLONOSCOPY WITH PROPOFOL N/A 02/05/2018   Procedure: COLONOSCOPY WITH PROPOFOL;  Surgeon: Wyline Mood, MD;  Location: [facility] ENDOSCOPY;  Service: Gastroenterology;  Laterality: N/A;  . COLONOSCOPY WITH PROPOFOL N/A 08/09/2019   Procedure: COLONOSCOPY WITH PROPOFOL;  Surgeon: Toledo, Amara Nearing, MD;   Location: [facility] ENDOSCOPY;  Service: Gastroenterology;  Laterality: N/A;  . ESOPHAGOGASTRODUODENOSCOPY (EGD) WITH PROPOFOL N/A 01/03/2016   Procedure: ESOPHAGOGASTRODUODENOSCOPY (EGD) WITH PROPOFOL;  Surgeon: Scot Jun, MD;  Location: [facility] ENDOSCOPY;  Service: Endoscopy;  Laterality: N/A;  . ESOPHAGOGASTRODUODENOSCOPY (EGD) WITH PROPOFOL  08/02/2017   Procedure: ESOPHAGOGASTRODUODENOSCOPY (EGD) WITH PROPOFOL;  Surgeon: Midge Minium, MD;  Location: [facility] ENDOSCOPY;  Service: Endoscopy;;  . ESOPHAGOGASTRODUODENOSCOPY (EGD) WITH PROPOFOL N/A 02/05/2018   Procedure: ESOPHAGOGASTRODUODENOSCOPY (EGD) WITH PROPOFOL;  Surgeon: Wyline Mood, MD;  Location: [facility] ENDOSCOPY;  Service: Gastroenterology;  Laterality: N/A;  . ESOPHAGOGASTRODUODENOSCOPY (EGD) WITH PROPOFOL N/A 03/30/2018   Procedure: ESOPHAGOGASTRODUODENOSCOPY (EGD) WITH PROPOFOL WITH DILATION;  Surgeon: Wyline Mood, MD;  Location: [facility] ENDOSCOPY;  Service: Gastroenterology;  Laterality: N/A;  . ESOPHAGOGASTRODUODENOSCOPY (EGD) WITH PROPOFOL N/A 08/09/2019   Procedure: ESOPHAGOGASTRODUODENOSCOPY (EGD) WITH PROPOFOL;  Surgeon: Toledo, Schirtzinger Nearing, MD;  Location: [facility] ENDOSCOPY;  Service: Gastroenterology;  Laterality: N/A;    Family History  Problem Relation Age of Onset  . Diabetes Mother   . Heart disease Father   . Prostate cancer Brother   . Bladder Cancer Neg Hx   . Kidney cancer Neg Hx     Social History:  reports that he has quit smoking. He has never used smokeless tobacco. He reports that he does not drink alcohol or use drugs.  Allergies: No Known Allergies  Medications reviewed.    ROS Full ROS performed and is otherwise negative other than what is stated in the HPI    BP Marland Kitchen)  173/82   Pulse 87   Temp (!) 95.2 F (35.1 C) (Temporal)   Ht 6' (1.829 m)   Wt 183 lb (83 kg)   SpO2 96%   BMI 24.82 kg/m   Physical Exam Vitals and nursing note reviewed. Exam conducted with a chaperone present.  Constitutional:       General: He is not in acute distress.    Appearance: Normal appearance. He is normal weight. He is not ill-appearing.  Eyes:     General: No scleral icterus.       Right eye: No discharge.        Left eye: No discharge.  Cardiovascular:     Rate and Rhythm: Normal rate and regular rhythm.     Heart sounds: No murmur.  Pulmonary:     Effort: Pulmonary effort is normal. No respiratory distress.     Breath sounds: Normal breath sounds. No stridor. No wheezing or rhonchi.  Abdominal:     General: Abdomen is flat. There is no distension.     Palpations: There is no mass.     Tenderness: There is no abdominal tenderness. There is no guarding or rebound.     Hernia: No hernia is present.  Musculoskeletal:        General: No swelling. Normal range of motion.     Cervical back: Normal range of motion and neck supple. No rigidity or tenderness.  Skin:    General: Skin is warm and dry.     Capillary Refill: Capillary refill takes less than 2 seconds.  Neurological:     General: No focal deficit present.     Mental Status: He is alert and oriented to person, place, and time.     Cranial Nerves: No cranial nerve deficit.  Psychiatric:        Mood and Affect: Mood normal.        Behavior: Behavior normal.        Thought Content: Thought content normal.        Judgment: Judgment normal.   Assessment/Plan: 69 year old male with a complex history of lower GI bleed presumably with pandiverticulosis and so far the only positive findings on extensive work-up.  We still do not have 100% clear diagnosis since there has not been any witnessed active bleeding on any of his previous diagnostics tests. I would Like to wait for the upper GI series as well as the Meckel scan to see if there is any other potential source.  He is also very hesitant about doing any surgical intervention as he feels that his ears bleedings are controlled. I will see him back in a few weeks and revisit potential for colectomy.   Currently he is not actively bleeding and there is no need for any urgent or emergent intervention at this time.   May need to do Ct enterography or capsule endoscopy before committing to colectomy  Caroleen Hamman, MD [place] Surgeon

## 2020-01-10 ENCOUNTER — Telehealth: Payer: Self-pay

## 2020-01-10 NOTE — Telephone Encounter (Signed)
Spoke with pt regarding his missed hematology appointment. Pt states he didn't know that he was scheduled with hematology. Pt states he may have been confused as he had 3 appointments back to back between 01-07-20 and 01-08-20. I informed him of hematology's contact information and gave him directions to contact their office to reschedule. Pt agrees to do so.

## 2020-01-10 NOTE — Telephone Encounter (Signed)
Called pt to inquire about missed Hematology appointment.  Unable to contact, LVM to return call

## 2020-01-10 NOTE — Telephone Encounter (Signed)
-----   Message from Wyline Mood, MD sent at 01/10/2020  9:22 AM EDT ----- Regarding: FW: Hematology Referral-No Show Can you find out why he didn't show up ?  Kiran ----- Message ----- From: Avie Arenas, CMA Sent: 01/08/2020   2:36 PM EDT To: Wyline Mood, MD Subject: Hematology Referral-No Show                    Dr. Hansel Starling RN at Dr. Sharlette Dense just informed me that Mr. Flannery did not show up for his appt today.  She wanted to make you aware.  Thank you,  Marcelino Duster, CMA

## 2020-01-17 ENCOUNTER — Encounter: Payer: Self-pay | Admitting: Internal Medicine

## 2020-01-17 ENCOUNTER — Inpatient Hospital Stay: Payer: Medicare HMO | Attending: Internal Medicine | Admitting: Internal Medicine

## 2020-01-17 ENCOUNTER — Telehealth: Payer: Self-pay | Admitting: Internal Medicine

## 2020-01-17 ENCOUNTER — Other Ambulatory Visit: Payer: Self-pay

## 2020-01-17 ENCOUNTER — Inpatient Hospital Stay: Payer: Medicare HMO

## 2020-01-17 DIAGNOSIS — D500 Iron deficiency anemia secondary to blood loss (chronic): Secondary | ICD-10-CM

## 2020-01-17 DIAGNOSIS — K5791 Diverticulosis of intestine, part unspecified, without perforation or abscess with bleeding: Secondary | ICD-10-CM

## 2020-01-17 LAB — CBC WITH DIFFERENTIAL/PLATELET
Abs Immature Granulocytes: 0.03 10*3/uL (ref 0.00–0.07)
Basophils Absolute: 0.1 10*3/uL (ref 0.0–0.1)
Basophils Relative: 1 %
Eosinophils Absolute: 0.1 10*3/uL (ref 0.0–0.5)
Eosinophils Relative: 1 %
HCT: 43.5 % (ref 39.0–52.0)
Hemoglobin: 13.9 g/dL (ref 13.0–17.0)
Immature Granulocytes: 0 %
Lymphocytes Relative: 16 %
Lymphs Abs: 1.6 10*3/uL (ref 0.7–4.0)
MCH: 31 pg (ref 26.0–34.0)
MCHC: 32 g/dL (ref 30.0–36.0)
MCV: 96.9 fL (ref 80.0–100.0)
Monocytes Absolute: 0.9 10*3/uL (ref 0.1–1.0)
Monocytes Relative: 9 %
Neutro Abs: 7.3 10*3/uL (ref 1.7–7.7)
Neutrophils Relative %: 73 %
Platelets: 205 10*3/uL (ref 150–400)
RBC: 4.49 MIL/uL (ref 4.22–5.81)
RDW: 14.6 % (ref 11.5–15.5)
WBC: 10 10*3/uL (ref 4.0–10.5)
nRBC: 0 % (ref 0.0–0.2)

## 2020-01-17 NOTE — Assessment & Plan Note (Addendum)
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  [facility] December 21, 2019-hemoglobin 9 saturation 7% ferritin 10.  #Discussed regarding IV iron infusion; Discussed the potential acute infusion reactions with IV iron; which are quite rare.  Patient understands the risk; will proceed with infusions.  #Plan labs today.   #Diverticular bleeding recurrent-defer to GI for further management.  Thank you Dr.Anna for allowing me to participate in the care of your pleasant patient. Please do not hesitate to contact me with questions or concerns in the interim.  # DISPOSITION: # labs- cbc today # venofer weekly x4 # follow up in 8 weeks- MD;labs- cbc/possilble venofer-Dr.B   Addendum: Patient's hemoglobin is 13 today; would recommend holding off IV iron infusion at this time.  We will recheck in 8 weeks; and if needed we will plan infusion at the time.  Patient will be informed.

## 2020-01-17 NOTE — Progress Notes (Signed)
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     HISTORY OF PRESENTING ILLNESS:  Caleb Edwards 69 y.o.  male has been referred to [place] for IV iron infusions for ongoing iron deficiency anemia.  Patient has history of diverticular bleeding; most recent March 2021.  He was evaluated at [facility], [place]-hemoglobin 9.5; patient did not receive any blood transfusion.  Patient received IV iron.  Patient overall feels well today.  Mild fatigue.  Otherwise no shortness of breath.  Fairly active.    Review of Systems  Constitutional: Positive for malaise/fatigue. Negative for chills, diaphoresis, fever and weight loss.  HENT: Negative for nosebleeds and sore throat.   Eyes: Negative for double vision.  Respiratory: Negative for cough, hemoptysis, sputum production, shortness of breath and wheezing.   Cardiovascular: Negative for chest pain, palpitations, orthopnea and leg swelling.  Gastrointestinal: Negative for abdominal pain, blood in stool, constipation, diarrhea, heartburn, melena, nausea and vomiting.  Genitourinary: Negative for dysuria, frequency and urgency.  Musculoskeletal: Positive for back pain and joint pain.  Skin: Negative.  Negative for itching and rash.  Neurological: Negative for dizziness, tingling, focal weakness, weakness and headaches.  Endo/Heme/Allergies: Does not bruise/bleed easily.  Psychiatric/Behavioral: Negative for depression. The patient is not nervous/anxious and does not have insomnia.     MEDICAL HISTORY:  Past Medical History:  Diagnosis Date  . Back pain   . Blood transfusion without  reported diagnosis 12/2015   bleeding ulcer hospitalization.   . Diverticulitis   . Hypertension   . Ulcer, stomach peptic, chronic     SURGICAL HISTORY: Past Surgical History:  Procedure Laterality Date  . COLONOSCOPY WITH PROPOFOL N/A 08/02/2017   Procedure: COLONOSCOPY WITH PROPOFOL;  Surgeon: Midge Minium, MD;  Location: [facility] ENDOSCOPY;  Service: Endoscopy;  Laterality: N/A;  . COLONOSCOPY WITH PROPOFOL N/A 02/05/2018   Procedure: COLONOSCOPY WITH PROPOFOL;  Surgeon: Wyline Mood, MD;  Location: [facility] ENDOSCOPY;  Service: Gastroenterology;  Laterality: N/A;  . COLONOSCOPY WITH PROPOFOL N/A 08/09/2019   Procedure: COLONOSCOPY WITH PROPOFOL;  Surgeon: Toledo, Leath Nearing, MD;  Location: [facility] ENDOSCOPY;  Service: Gastroenterology;  Laterality: N/A;  . ESOPHAGOGASTRODUODENOSCOPY (EGD) WITH PROPOFOL N/A 01/03/2016   Procedure: ESOPHAGOGASTRODUODENOSCOPY (EGD) WITH PROPOFOL;  Surgeon: Scot Jun, MD;  Location: [facility] ENDOSCOPY;  Service: Endoscopy;  Laterality: N/A;  . ESOPHAGOGASTRODUODENOSCOPY (EGD) WITH PROPOFOL  08/02/2017   Procedure: ESOPHAGOGASTRODUODENOSCOPY (EGD) WITH PROPOFOL;  Surgeon: Midge Minium, MD;  Location: [facility] ENDOSCOPY;  Service: Endoscopy;;  . ESOPHAGOGASTRODUODENOSCOPY (EGD) WITH PROPOFOL N/A 02/05/2018   Procedure: ESOPHAGOGASTRODUODENOSCOPY (EGD) WITH PROPOFOL;  Surgeon: Wyline Mood, MD;  Location: [facility] ENDOSCOPY;  Service: Gastroenterology;  Laterality: N/A;  . ESOPHAGOGASTRODUODENOSCOPY (EGD) WITH PROPOFOL N/A 03/30/2018   Procedure: ESOPHAGOGASTRODUODENOSCOPY (EGD) WITH PROPOFOL WITH DILATION;  Surgeon: Wyline Mood, MD;  Location: [facility] ENDOSCOPY;  Service: Gastroenterology;  Laterality: N/A;  . ESOPHAGOGASTRODUODENOSCOPY (EGD) WITH PROPOFOL N/A 08/09/2019   Procedure: ESOPHAGOGASTRODUODENOSCOPY (EGD) WITH PROPOFOL;  Surgeon: Toledo, Beshears Nearing, MD;  Location: [facility] ENDOSCOPY;  Service: Gastroenterology;  Laterality: N/A;    SOCIAL HISTORY: Social History   Socioeconomic  History  . Marital status: Married    Spouse name: Not on file  . Number of children: Not  on file  . Years of education: Not on file  . Highest education level: Not on file  Occupational History  . Not on file  Tobacco Use  . Smoking status: Former Research scientist (life sciences)  . Smokeless tobacco: Never Used  Substance and Sexual Activity  . Alcohol use: No    Alcohol/week: 0.0 standard drinks  . Drug use: No  . Sexual activity: Yes  Other Topics Concern  . Not on file  Social History Narrative   apartment [place] [address]; lives in [place] with wife; quit smoking 25 years ago; beer- couple every day.    Social Determinants of Health   Financial Resource Strain:   . Difficulty of Paying Living Expenses:   Food Insecurity:   . Worried About Charity fundraiser in the Last Year:   . Arboriculturist in the Last Year:   Transportation Needs:   . Film/video editor (Medical):   Marland Kitchen Lack of Transportation (Non-Medical):   Physical Activity:   . Days of Exercise per Week:   . Minutes of Exercise per Session:   Stress:   . Feeling of Stress :   Social Connections:   . Frequency of Communication with Friends and Family:   . Frequency of Social Gatherings with Friends and Family:   . Attends Religious Services:   . Active Member of Clubs or Organizations:   . Attends Archivist Meetings:   Marland Kitchen Marital Status:   Intimate Partner Violence:   . Fear of Current or Ex-Partner:   . Emotionally Abused:   Marland Kitchen Physically Abused:   . Sexually Abused:     FAMILY HISTORY: Family History  Problem Relation Age of Onset  . Diabetes Mother   . Heart disease Father   . Prostate cancer Brother   . Bladder Cancer Neg Hx   . Kidney cancer Neg Hx     ALLERGIES:  has No Known Allergies.  MEDICATIONS:  Current Outpatient Medications  Medication Sig Dispense Refill  . amLODipine (NORVASC) 10 MG tablet Take 1 tablet by mouth daily.  3  . atorvastatin (LIPITOR) 20 MG tablet Take 1 tablet by mouth at  bedtime.  3  . chlorthalidone (HYGROTON) 50 MG tablet Take 1 tablet by mouth daily.  3  . ferrous sulfate 325 (65 FE) MG tablet Take 325 mg by mouth daily with breakfast.    . lisinopril (PRINIVIL,ZESTRIL) 40 MG tablet Take 1 tablet by mouth daily.  3  . pantoprazole (PROTONIX) 40 MG tablet Take 1 tablet (40 mg total) by mouth 2 (two) times daily before a meal. 60 tablet 0  . polyethylene glycol (MIRALAX / GLYCOLAX) packet Take 17 g by mouth daily. 30 each 0  . sildenafil (VIAGRA) 100 MG tablet Take 100 mg by mouth daily as needed for erectile dysfunction.    . traMADol (ULTRAM) 50 MG tablet Take 2 tablets (100 mg total) by mouth every 6 (six) hours as needed for severe pain. Each refill must last 30 days. 240 tablet 5   No current facility-administered medications for this visit.      PHYSICAL EXAMINATION:   Vitals:   01/17/20 1144  BP: (!) 167/96  Pulse: 66  Temp: (!) 97.3 F (36.3 C)   Filed Weights   01/17/20 1144  Weight: 178 lb (80.7 kg)    Physical Exam  Constitutional: He is oriented to person, place, and time and well-developed, well-nourished, and in no distress.  HENT:  Head: Normocephalic and atraumatic.  Mouth/Throat: Oropharynx  is clear and moist. No oropharyngeal exudate.  Eyes: Pupils are equal, round, and reactive to light.  Cardiovascular: Normal rate and regular rhythm.  Pulmonary/Chest: Effort normal and breath sounds normal. No respiratory distress. He has no wheezes.  Abdominal: Soft. Bowel sounds are normal. He exhibits no distension and no mass. There is no abdominal tenderness. There is no rebound and no guarding.  Musculoskeletal:        General: No tenderness or edema. Normal range of motion.     Cervical back: Normal range of motion and neck supple.  Neurological: He is alert and oriented to person, place, and time.  Skin: Skin is warm.  Psychiatric: Affect normal.    LABORATORY DATA:  I have reviewed the data as listed Lab Results   Component Value Date   WBC 10.0 01/17/2020   HGB 13.9 01/17/2020   HCT 43.5 01/17/2020   MCV 96.9 01/17/2020   PLT 205 01/17/2020   Recent Labs    08/08/19 0803 08/09/19 0606  NA 137 138  K 3.8 3.9  CL 104 106  CO2 23 25  GLUCOSE 108* 104*  BUN 23 14  CREATININE 1.14 1.15  CALCIUM 8.7* 8.7*  GFRNONAA >60 >60  GFRAA >60 >60  PROT 6.9 7.1  ALBUMIN 3.7 3.8  AST 24 23  ALT 15 15  ALKPHOS 57 53  BILITOT 0.6 0.7     NM Bowel Img Meckels  Result Date: 01/08/2020 CLINICAL DATA:  Rectal bleeding question Meckel's diverticulum EXAM: NUCLEAR MEDICINE MECKELS SCAN TECHNIQUE: Sequential abdominal images were obtained following intravenous injection of radiopharmaceutical. RADIOPHARMACEUTICALS:  10.123 mCi Tc-48m pertechnetate IV COMPARISON:  CT abdomen pelvis 01/25/2018 FINDINGS: Expected localization of mid pertechnetate within gastric mucosa. Excreted tracer into first and second portions of duodenum. No abnormal gastrointestinal localization of tracer seen to suggest Meckel's diverticulum. Excreted tracer within urinary bladder. IMPRESSION: Negative Meckel's scan Electronically Signed   By: Ulyses Southward M.D.   On: 01/08/2020 11:12   DG UGI W SMALL BOWEL  Result Date: 01/08/2020 CLINICAL DATA:  History of a duodenal stricture, requires capsule study, eval for strictures in the proximal and rest of the small bowel ,before to perform a capsule study of the small bowel EXAM: UPPER GI SERIES WITH KUB TECHNIQUE: After obtaining a scout radiograph a routine upper GI series was performed using thin and high density barium. FLUOROSCOPY TIME:  Fluoroscopy Time:  0.5 minute Radiation Exposure Index (if provided by the fluoroscopic device): 24.4 mGy Number of Acquired Spot Images: 0 COMPARISON:  None. FINDINGS: Examination of the esophagus demonstrated normal esophageal motility. Normal esophageal morphology without evidence of esophagitis or ulceration. No esophageal stricture, diverticula, or mass  lesion. No evidence of hiatal hernia. Mild gastroesophageal reflux. Examination of the stomach demonstrated normal rugal folds and areae gastricae. The gastric mucosa appeared unremarkable without evidence of ulceration, scarring, or mass lesion. Gastric motility and emptying was normal. Fluoroscopic examination of the duodenum demonstrates normal motility and morphology without evidence of ulceration or mass lesion. Medium density barium was periodically observed under fluoroscopy to travel from the stomach to the ascending colon (over a 45 minute time period). There is no evidence of small bowel stricture or obstruction. No large filling defects to suggest mass lesion. In addition, there is no evidence of tethering or definite inflammatory changes present within the small bowel. IMPRESSION: 1. No esophageal stricture or ulceration. 2. Mild gastroesophageal reflux. 3. Normal upper GI and small bowel follow-through. Electronically Signed   By: Alan Ripper  Patel   On: 01/08/2020 12:24    Iron deficiency anemia due to chronic blood loss #Iron deficient anemia likely secondary to diverticular bleeding.  [facility] December 21, 2019-hemoglobin 9 saturation 7% ferritin 10.  #Discussed regarding IV iron infusion; Discussed the potential acute infusion reactions with IV iron; which are quite rare.  Patient understands the risk; will proceed with infusions.  #Plan labs today.   #Diverticular bleeding recurrent-defer to GI for further management.  Thank you Dr.Anna for allowing me to participate in the care of your pleasant patient. Please do not hesitate to contact me with questions or concerns in the interim.  # DISPOSITION: # labs- cbc today # venofer weekly x4 # follow up in 8 weeks- MD;labs- cbc/possilble venofer-Dr.B   Addendum: Patient's hemoglobin is 13 today; would recommend holding off IV iron infusion at this time.  We will recheck in 8 weeks; and if needed we will plan infusion at the time.  Patient will be  informed.   All questions were answered. The patient knows to call the clinic with any problems, questions or concerns.    Earna Coder, MD 01/17/2020 2:29 PM

## 2020-01-17 NOTE — Addendum Note (Signed)
Addended by: Keitha Butte on: 01/17/2020 03:21 PM   Modules accepted: Orders

## 2020-01-17 NOTE — Telephone Encounter (Signed)
H/T-please inform patient that his hemoglobin is improved at 13 on today's blood work.  I would not recommend any IV iron infusion at this time.  Continue oral iron pill  As pt is currently taking.   C-cancel infusion appointments; keep appointments as planned-in 8 week; MD;labs;possible venofer. ADD-iron studies ferritin at that visit.  GB

## 2020-01-17 NOTE — Telephone Encounter (Signed)
Spoke patient. He gave verbal understanding of the plan of care. He understands that IV infusions are now cnl and not needed. He will return to clinic on 6/9.   Iron/ferritin studies added to pt's scheduled labs on 6/9

## 2020-01-24 ENCOUNTER — Ambulatory Visit: Payer: Medicare HMO

## 2020-01-28 ENCOUNTER — Ambulatory Visit: Payer: Medicare HMO | Admitting: Surgery

## 2020-01-30 ENCOUNTER — Other Ambulatory Visit: Payer: Self-pay

## 2020-01-30 ENCOUNTER — Ambulatory Visit (INDEPENDENT_AMBULATORY_CARE_PROVIDER_SITE_OTHER): Payer: Medicare HMO | Admitting: Surgery

## 2020-01-30 ENCOUNTER — Encounter: Payer: Self-pay | Admitting: Surgery

## 2020-01-30 VITALS — BP 132/78 | HR 88 | Temp 97.9°F | Resp 12 | Ht 72.0 in | Wt 178.6 lb

## 2020-01-30 DIAGNOSIS — K922 Gastrointestinal hemorrhage, unspecified: Secondary | ICD-10-CM

## 2020-01-30 NOTE — Patient Instructions (Addendum)
Your CT Enterography is scheduled for March 10, 2020 at 10am at the [facility]. Arrival 8:45am. Nothing to eat/drink 4 hours prior.   You will need to have lab work completed prior to this appointment. You can have this done at the [facility]. You do not need an appointment for this.   Please see your follow up visit with Dr Everlene Farrier to discuss your results.   Call the office if you have any questions or concerns.

## 2020-01-31 ENCOUNTER — Ambulatory Visit: Payer: Medicare HMO

## 2020-02-01 NOTE — Progress Notes (Signed)
Outpatient Surgical Follow Up  02/01/2020  Caleb Edwards is an 69 y.o. male.   Chief Complaint  Patient presents with  . Follow-up    discuss imaging     HPI: 69 year old male well-known to me with history of recurrent lower GI bleed presumed to be from diverticulitis.  He currently denies any melena or any hematochezia.  He also underwent an upper GI series that I have personally reviewed showing no evidence of definitive lesions.  No other strictures or ulcerations. He does have a pending capsule endoscopy.  I have also personally reviewed the Meckel's scan showing no evidence of Meckel's.  Past Medical History:  Diagnosis Date  . Back pain   . Blood transfusion without reported diagnosis 12/2015   bleeding ulcer hospitalization.   . Diverticulitis   . Hypertension   . Ulcer, stomach peptic, chronic     Past Surgical History:  Procedure Laterality Date  . COLONOSCOPY WITH PROPOFOL N/A 08/02/2017   Procedure: COLONOSCOPY WITH PROPOFOL;  Surgeon: Lucilla Lame, MD;  Location: [facility] ENDOSCOPY;  Service: Endoscopy;  Laterality: N/A;  . COLONOSCOPY WITH PROPOFOL N/A 02/05/2018   Procedure: COLONOSCOPY WITH PROPOFOL;  Surgeon: Jonathon Bellows, MD;  Location: [facility] ENDOSCOPY;  Service: Gastroenterology;  Laterality: N/A;  . COLONOSCOPY WITH PROPOFOL N/A 08/09/2019   Procedure: COLONOSCOPY WITH PROPOFOL;  Surgeon: Toledo, Benay Pike, MD;  Location: [facility] ENDOSCOPY;  Service: Gastroenterology;  Laterality: N/A;  . ESOPHAGOGASTRODUODENOSCOPY (EGD) WITH PROPOFOL N/A 01/03/2016   Procedure: ESOPHAGOGASTRODUODENOSCOPY (EGD) WITH PROPOFOL;  Surgeon: Manya Silvas, MD;  Location: [facility] ENDOSCOPY;  Service: Endoscopy;  Laterality: N/A;  . ESOPHAGOGASTRODUODENOSCOPY (EGD) WITH PROPOFOL  08/02/2017   Procedure: ESOPHAGOGASTRODUODENOSCOPY (EGD) WITH PROPOFOL;  Surgeon: Lucilla Lame, MD;  Location: [facility] ENDOSCOPY;  Service: Endoscopy;;  . ESOPHAGOGASTRODUODENOSCOPY (EGD) WITH PROPOFOL N/A 02/05/2018   Procedure: ESOPHAGOGASTRODUODENOSCOPY (EGD) WITH PROPOFOL;  Surgeon: Jonathon Bellows, MD;  Location: [facility] ENDOSCOPY;  Service: Gastroenterology;  Laterality: N/A;  . ESOPHAGOGASTRODUODENOSCOPY (EGD) WITH PROPOFOL N/A 03/30/2018   Procedure: ESOPHAGOGASTRODUODENOSCOPY (EGD) WITH PROPOFOL WITH DILATION;  Surgeon: Jonathon Bellows, MD;  Location: [facility] ENDOSCOPY;  Service: Gastroenterology;  Laterality: N/A;  . ESOPHAGOGASTRODUODENOSCOPY (EGD) WITH PROPOFOL N/A 08/09/2019   Procedure: ESOPHAGOGASTRODUODENOSCOPY (EGD) WITH PROPOFOL;  Surgeon: Toledo, Benay Pike, MD;  Location: [facility] ENDOSCOPY;  Service: Gastroenterology;  Laterality: N/A;    Family History  Problem Relation Age of Onset  . Diabetes Mother   . Heart disease Father   . Prostate cancer Brother   . Bladder Cancer Neg Hx   . Kidney cancer Neg Hx     Social History:  reports that he has quit smoking. He has never used smokeless tobacco. He reports that he does not drink alcohol or use drugs.  Allergies: No Known Allergies  Medications reviewed.    ROS Full ROS performed and is otherwise negative other than what is stated in HPI   BP 132/78   Pulse 88   Temp 97.9 F (36.6 C)   Resp 12   Ht 6' (1.829 m)   Wt 178 lb 9.6 oz (81 kg)   SpO2 94%   BMI 24.22 kg/m   Physical Exam Vitals and nursing note reviewed. Exam conducted with a chaperone present.  Constitutional:      Appearance: Normal appearance.  Pulmonary:     Effort: Pulmonary effort is normal. No respiratory distress.     Breath sounds: Normal breath sounds. No stridor.  Abdominal:     General: Abdomen is flat. There is no distension.  Palpations: There is no mass.     Tenderness: There is no abdominal tenderness. There is no guarding or rebound.     Hernia: No hernia is present.  Musculoskeletal:        General: Normal range of motion.     Cervical back: Normal range of motion and neck supple. No rigidity.  Skin:    General: Skin is warm and dry.     Capillary  Refill: Capillary refill takes less than 2 seconds.  Neurological:     General: No focal deficit present.     Mental Status: He is alert and oriented to person, place, and time.  Psychiatric:        Mood and Affect: Mood normal.        Behavior: Behavior normal.        Thought Content: Thought content normal.        Judgment: Judgment normal.     Assessment/Plan:  1. Lower GI bleed from presumed diverticulitis however not a definitive diagnosis.  I will await for the capsule endoscopy and also perform a CT enterography to rule out any potential small bowel sources. Patient is very hesitant about having a subtotal colectomy at this time.  He is in agreement with further diagnostic test.  I will see him back after he completes the rest of his work-up.  No need for emergent surgical intervention   Greater than 50% of the 25 minutes  visit was spent in counseling/coordination of care   Sterling Big, MD [facility] General Surgeon

## 2020-02-07 ENCOUNTER — Ambulatory Visit: Payer: Medicare HMO

## 2020-02-12 ENCOUNTER — Encounter: Payer: Self-pay | Admitting: Gastroenterology

## 2020-02-12 ENCOUNTER — Telehealth: Payer: Medicare HMO | Admitting: Pain Medicine

## 2020-02-14 ENCOUNTER — Ambulatory Visit: Payer: Medicare HMO

## 2020-02-14 ENCOUNTER — Telehealth: Payer: Self-pay

## 2020-02-14 NOTE — Telephone Encounter (Signed)
Attempted to call patient, No anwer, Left VM to call our office for Mondays VV

## 2020-02-15 ENCOUNTER — Encounter: Payer: Self-pay | Admitting: Pain Medicine

## 2020-02-17 NOTE — Progress Notes (Signed)
Patient: Caleb Edwards  Service Category: E/M  Provider: Gaspar Cola, MD  DOB: 1951-09-16  DOS: 02/18/2020  Location: Office  MRN: [id number]  Setting: Ambulatory outpatient  Referring Provider: Cherylann Parr, MD  Type: Established Patient  Specialty: Interventional Pain Management  PCP: Caleb Parr, MD  Location: Remote location  Delivery: TeleHealth     Virtual Encounter - Pain Management PROVIDER NOTE: Information contained herein reflects review and annotations entered in association with encounter. Interpretation of such information and data should be left to medically-trained personnel. Information provided to patient can be located elsewhere in the medical record under "Patient Instructions". Document created using STT-dictation technology, any transcriptional errors that may result from process are unintentional.    Contact & Pharmacy Preferred: [phone] Home: [phone] (home) Mobile: [phone] (mobile) E-mail: [email or web address]  [facility] [address] ([address]), [place] ([place]) [place] [address] Phone: [phone] Fax: [phone]   Pre-screening  [name] offered "in-person" vs "virtual" encounter. He indicated preferring virtual for this encounter.   Reason COVID-19*  Social distancing based on CDC and AMA recommendations.   I contacted Caleb Edwards on 02/18/2020 via telephone.      I clearly identified myself as 03/02/2020, MD. I verified that I was speaking with the correct person using two identifiers (Name: Caleb Edwards, and date of birth: 23-Aug-1951).  Consent I sought verbal advanced consent from 15/12/1950 for virtual visit interactions. I informed Caleb Edwards of possible security and privacy concerns, risks, and limitations associated with providing "not-in-person" medical evaluation and management services. I also informed Caleb Edwards of the availability of  "in-person" appointments. Finally, I informed him that there would be a charge for the virtual visit and that he could be  personally, fully or partially, financially responsible for it. Caleb Edwards expressed understanding and agreed to proceed.   Historic Elements   Caleb Edwards is a 69 y.o. year old, male patient evaluated today after his last contact with our practice on 02/14/2020. Caleb Edwards  has a past medical history of Back pain, Blood transfusion without reported diagnosis (12/2015), Diverticulitis, Hypertension, and Ulcer, stomach peptic, chronic. He also  has a past surgical history that includes Esophagogastroduodenoscopy (egd) with propofol (N/A, 01/03/2016); Colonoscopy with propofol (N/A, 08/02/2017); Esophagogastroduodenoscopy (egd) with propofol (08/02/2017); Esophagogastroduodenoscopy (egd) with propofol (N/A, 02/05/2018); Colonoscopy with propofol (N/A, 02/05/2018); Esophagogastroduodenoscopy (egd) with propofol (N/A, 03/30/2018); Esophagogastroduodenoscopy (egd) with propofol (N/A, 08/09/2019); and Colonoscopy with propofol (N/A, 08/09/2019). Caleb Edwards has a current medication list which includes the following prescription(s): amlodipine, atorvastatin, chlorthalidone, ferrous sulfate, lisinopril, pantoprazole, polyethylene glycol, sildenafil, and [START ON 03/06/2020] tramadol. He  reports that he has quit smoking. He has never used smokeless tobacco. He reports that he does not drink alcohol or use drugs. Caleb Edwards has No Known Allergies.   HPI  Today, he is being contacted for medication management. The patient indicates doing well with the current medication regimen. No adverse reactions or side effects reported to the medications.  The patient refers that he is having a flareup of his lower extremity pain where it is worse than the lower back, bilateral, with the right being worse than the left.  In the past we had done transforaminal epidural steroid injections on either side, but at this  point he refers that the pain is bilateral and it is probably just as bad on the right as it is on the left.  He would like for those 2 sites to be treated.  We will go ahead and make arrangements for that as soon as possible.  Pharmacotherapy Assessment  Analgesic: Tramadol 50 mg, 2 tabs PO q 6 hrs (400 mg/day of tramadol) MME/day:40 mg/day.   Monitoring:  PMP: PDMP reviewed during this encounter.       Pharmacotherapy: No side-effects or adverse reactions reported. Compliance: No problems identified. Effectiveness: Clinically acceptable. Plan: Refer to "POC".  UDS:  Summary  Date Value Ref Range Status  03/24/2017 FINAL  Final    Comment:    ==================================================================== TOXASSURE SELECT 13 (MW) ==================================================================== Test                             Result       Flag       Units Drug Present   Tramadol                       PRESENT   O-Desmethyltramadol            PRESENT   N-Desmethyltramadol            PRESENT    Source of tramadol is a prescription medication.    O-desmethyltramadol and N-desmethyltramadol are expected    metabolites of tramadol. ==================================================================== Test                      Result    Flag   Units      Ref Range   Creatinine              125              mg/dL      >=20 ==================================================================== Declared Medications:  Medication list was not provided. ==================================================================== For clinical consultation, please call [phone]. ====================================================================    Laboratory Chemistry Profile   Renal Lab Results  Component Value Date   BUN 14 08/09/2019   CREATININE 1.15 08/09/2019   GFRAA >60 08/09/2019   GFRNONAA >60 08/09/2019     Hepatic Lab Results  Component Value Date   AST 23  08/09/2019   ALT 15 08/09/2019   ALBUMIN 3.8 08/09/2019   ALKPHOS 53 08/09/2019   LIPASE 17 08/08/2019     Electrolytes Lab Results  Component Value Date   NA 138 08/09/2019   K 3.9 08/09/2019   CL 106 08/09/2019   CALCIUM 8.7 (L) 08/09/2019   MG 1.9 01/04/2016     Bone Lab Results  Component Value Date   VD25OH 24.0 (L) 12/26/2015     Inflammation (CRP: Acute Phase) (ESR: Chronic Phase) Lab Results  Component Value Date   CRP 0.6 12/26/2015   ESRSEDRATE 3 12/26/2015       Note: Above Lab results reviewed.  Imaging  DG UGI W SMALL BOWEL CLINICAL DATA:  History of a duodenal stricture, requires capsule study, eval for strictures in the proximal and rest of the small bowel ,before to perform a capsule study of the small bowel  EXAM: UPPER GI SERIES WITH KUB  TECHNIQUE: After obtaining a scout radiograph a routine upper GI series was performed using thin and high density barium.  FLUOROSCOPY TIME:  Fluoroscopy Time:  0.5 minute  Radiation Exposure Index (if provided by the fluoroscopic device): 24.4 mGy  Number of Acquired Spot Images: 0  COMPARISON:  None.  FINDINGS: Examination of the esophagus demonstrated normal esophageal motility. Normal  esophageal morphology without evidence of esophagitis or ulceration. No esophageal stricture, diverticula, or mass lesion. No evidence of hiatal hernia. Mild gastroesophageal reflux.  Examination of the stomach demonstrated normal rugal folds and areae gastricae. The gastric mucosa appeared unremarkable without evidence of ulceration, scarring, or mass lesion. Gastric motility and emptying was normal. Fluoroscopic examination of the duodenum demonstrates normal motility and morphology without evidence of ulceration or mass lesion.  Medium density barium was periodically observed under fluoroscopy to travel from the stomach to the ascending colon (over a 45 minute time period). There is no evidence of small bowel  stricture or obstruction. No large filling defects to suggest mass lesion. In addition, there is no evidence of tethering or definite inflammatory changes present within the small bowel.  IMPRESSION: 1. No esophageal stricture or ulceration. 2. Mild gastroesophageal reflux. 3. Normal upper GI and small bowel follow-through.  Electronically Signed   By: Kathreen Devoid   On: 01/08/2020 12:24 NM Bowel Img Meckels CLINICAL DATA:  Rectal bleeding question Meckel's diverticulum  EXAM: NUCLEAR MEDICINE MECKELS SCAN  TECHNIQUE: Sequential abdominal images were obtained following intravenous injection of radiopharmaceutical.  RADIOPHARMACEUTICALS:  10.123 mCi Tc-36mpertechnetate IV  COMPARISON:  CT abdomen pelvis 01/25/2018  FINDINGS: Expected localization of mid pertechnetate within gastric mucosa.  Excreted tracer into first and second portions of duodenum.  No abnormal gastrointestinal localization of tracer seen to suggest Meckel's diverticulum.  Excreted tracer within urinary bladder.  IMPRESSION: Negative Meckel's scan  Electronically Signed   By: MLavonia DanaM.D.   On: 01/08/2020 11:12  Assessment  The primary encounter diagnosis was Chronic pain syndrome. 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  Plan of Care  Problem-specific:  No problem-specific Assessment & Plan notes found for this encounter.  Mr. WiPHOENYX PAULSENas a current medication list which includes the following long-term medication(s): atorvastatin, chlorthalidone, lisinopril, pantoprazole, and [START ON 03/06/2020] tramadol.  Pharmacotherapy (Medications Ordered): Meds ordered this encounter   Medications  . traMADol (ULTRAM) 50 MG tablet    Sig: Take 2 tablets (100 mg total) by mouth every 6 (six) hours as needed for severe pain. Each refill must last 30 days.    Dispense:  240 tablet    Refill:  5    Chronic Pain: STOP Act - Not applicable. Fill 1 day early if closed on scheduled refill date. Do not fill until: 03/06/2020. To last until: 09/02/2020. Instruct to avoid benzodiazepines within 8 hours of opioid.   Orders:  Orders Placed This Encounter  Procedures  . Lumbar Transforaminal Epidural    Standing Status:   Future    Standing Expiration Date:   03/20/2020    Scheduling Instructions:     Side: Right-sided     Level: L3 & L4     Sedation: Patient's choice.     Timeframe: ASAP    Order Specific Question:   Where will this procedure be performed?    Answer:   [facility]  . Lumbar Transforaminal Epidural    Standing Status:   Standing    Number of Occurrences:   1    Standing Expiration Date:   02/17/2021    Scheduling Instructions:     Purpose: Therapeutic     Indication:  Lower extremity pain. Radiculopathy/Radiculitis lumbar (M54.16).     Side: Bilateral     Level: L3 & L4     Sedation: Patient's choice.     TIMEFRAME: PRN procedure. (Mr. Carll will call when needed.)    Order Specific Question:   Where will this procedure be performed?    Answer:   [facility]  . ToxASSURE Select 13 (MW), Urine    Volume: 30 ml(s). Minimum 3 ml of urine is needed. Document temperature of fresh sample. Indications: Long term (current) use of opiate analgesic (V37.106)    Order Specific Question:   Release to patient    Answer:   Immediate   Follow-up plan:   Return in 28 weeks (on 09/01/2020) for Procedure (w/ sedation): (B) L3 & L4 TFESI #3, (F2F), (MM).      Interventional treatment options:  Under consideration:   Diagnostic right IA hip joint injection  Diagnostic bilateral lumbar facet block  Possible bilateral lumbar facet RFA     Therapeutic/palliative (PRN):   Palliative left L3-4 LESI #3  Palliative left L4 TFESI #2  Palliative right L3 TFESI #2  Palliative right L4 TFESI #3     Recent Visits No visits were found meeting these conditions.  Showing recent visits within past 90 days and meeting all other requirements   Today's Visits Date Type Provider Dept  02/18/20 Telemedicine Milinda Pointer, MD [facility]  Showing today's visits and meeting all other requirements   Future Appointments Date Type Provider Dept  03/05/20 Appointment Milinda Pointer, MD [facility]  Showing future appointments within next 90 days and meeting all other requirements   I discussed the assessment and treatment plan with the patient. The patient was provided an opportunity to ask questions and all were answered. The patient agreed with the plan and demonstrated an understanding of the instructions.  Patient advised to call back or seek an in-person evaluation if the symptoms or condition worsens.  Duration of encounter: 12 minutes.  Note by: Gaspar Cola, MD Date: 02/18/2020; Time: 12:53 PM

## 2020-02-18 ENCOUNTER — Other Ambulatory Visit: Payer: Self-pay

## 2020-02-18 ENCOUNTER — Ambulatory Visit: Payer: Medicare HMO | Attending: Pain Medicine | Admitting: Pain Medicine

## 2020-02-18 DIAGNOSIS — M79605 Pain in left leg: Secondary | ICD-10-CM

## 2020-02-18 DIAGNOSIS — M48061 Spinal stenosis, lumbar region without neurogenic claudication: Secondary | ICD-10-CM

## 2020-02-18 DIAGNOSIS — M4310 Spondylolisthesis, site unspecified: Secondary | ICD-10-CM

## 2020-02-18 DIAGNOSIS — G894 Chronic pain syndrome: Secondary | ICD-10-CM

## 2020-02-18 DIAGNOSIS — M5442 Lumbago with sciatica, left side: Secondary | ICD-10-CM

## 2020-02-18 DIAGNOSIS — G8929 Other chronic pain: Secondary | ICD-10-CM

## 2020-02-18 DIAGNOSIS — Z79899 Other long term (current) drug therapy: Secondary | ICD-10-CM

## 2020-02-18 DIAGNOSIS — M5137 Other intervertebral disc degeneration, lumbosacral region: Secondary | ICD-10-CM | POA: Diagnosis not present

## 2020-02-18 MED ORDER — TRAMADOL HCL 50 MG PO TABS: 100.0000 mg | ORAL_TABLET | Freq: Four times a day (QID) | ORAL | 5 refills | Status: DC | PRN

## 2020-02-18 NOTE — Patient Instructions (Signed)
____________________________________________________________________________________________  Preparing for Procedure with Sedation  Procedure appointments are limited to planned procedures: . No Prescription Refills. . No disability issues will be discussed. . No medication changes will be discussed.  Instructions: . Oral Intake: Do not eat or drink anything for at least 8 hours prior to your procedure. (Exception: Blood Pressure Medication. See below.) . Transportation: Unless otherwise stated by your physician, you may drive yourself after the procedure. . Blood Pressure Medicine: Do not forget to take your blood pressure medicine with a sip of water the morning of the procedure. If your Diastolic (lower reading)is above 100 mmHg, elective cases will be cancelled/rescheduled. . Blood thinners: These will need to be stopped for procedures. Notify our staff if you are taking any blood thinners. Depending on which one you take, there will be specific instructions on how and when to stop it. . Diabetics on insulin: Notify the staff so that you can be scheduled 1st case in the morning. If your diabetes requires high dose insulin, take only  of your normal insulin dose the morning of the procedure and notify the staff that you have done so. . Preventing infections: Shower with an antibacterial soap the morning of your procedure. . Build-up your immune system: Take 1000 mg of Vitamin C with every meal (3 times a day) the day prior to your procedure. . Antibiotics: Inform the staff if you have a condition or reason that requires you to take antibiotics before dental procedures. . Pregnancy: If you are pregnant, call and cancel the procedure. . Sickness: If you have a cold, fever, or any active infections, call and cancel the procedure. . Arrival: You must be in the facility at least 30 minutes prior to your scheduled procedure. . Children: Do not bring children with you. . Dress appropriately:  Bring dark clothing that you would not mind if they get stained. . Valuables: Do not bring any jewelry or valuables.  Reasons to call and reschedule or cancel your procedure: (Following these recommendations will minimize the risk of a serious complication.) . Surgeries: Avoid having procedures within 2 weeks of any surgery. (Avoid for 2 weeks before or after any surgery). . Flu Shots: Avoid having procedures within 2 weeks of a flu shots or . (Avoid for 2 weeks before or after immunizations). . Barium: Avoid having a procedure within 7-10 days after having had a radiological study involving the use of radiological contrast. (Myelograms, Barium swallow or enema study). . Heart attacks: Avoid any elective procedures or surgeries for the initial 6 months after a "Myocardial Infarction" (Heart Attack). . Blood thinners: It is imperative that you stop these medications before procedures. Let us know if you if you take any blood thinner.  . Infection: Avoid procedures during or within two weeks of an infection (including chest colds or gastrointestinal problems). Symptoms associated with infections include: Localized redness, fever, chills, night sweats or profuse sweating, burning sensation when voiding, cough, congestion, stuffiness, runny nose, sore throat, diarrhea, nausea, vomiting, cold or Flu symptoms, recent or current infections. It is specially important if the infection is over the area that we intend to treat. . Heart and lung problems: Symptoms that may suggest an active cardiopulmonary problem include: cough, chest pain, breathing difficulties or shortness of breath, dizziness, ankle swelling, uncontrolled high or unusually low blood pressure, and/or palpitations. If you are experiencing any of these symptoms, cancel your procedure and contact your primary care physician for an evaluation.  Remember:  Regular Business hours are:    Monday to Thursday 8:00 AM to 4:00 PM  Provider's  Schedule: Darrio Bade, MD:  Procedure days: Tuesday and Thursday 7:30 AM to 4:00 PM  Bilal Lateef, MD:  Procedure days: Monday and Wednesday 7:30 AM to 4:00 PM ____________________________________________________________________________________________    

## 2020-02-20 ENCOUNTER — Telehealth: Payer: Self-pay

## 2020-02-20 NOTE — Telephone Encounter (Signed)
-----   Message from Wyline Mood, MD sent at 02/12/2020 11:03 AM EDT ----- No stricture seen on barium- proceed with EGD and placement of capsule endoscopically

## 2020-02-20 NOTE — Telephone Encounter (Signed)
Patient states he will discuss this at follow up appointment with Dr. Tobi Bastos

## 2020-02-21 ENCOUNTER — Other Ambulatory Visit
Admission: RE | Admit: 2020-02-21 | Discharge: 2020-02-21 | Disposition: A | Discharge status: Home / Self Care | Payer: Medicare HMO | Source: Ambulatory Visit | Attending: Pain Medicine | Admitting: Pain Medicine

## 2020-02-21 ENCOUNTER — Other Ambulatory Visit
Admission: RE | Admit: 2020-02-21 | Discharge: 2020-02-21 | Disposition: A | Discharge status: Home / Self Care | Payer: Medicare HMO | Source: Ambulatory Visit | Attending: Surgery | Admitting: Surgery

## 2020-02-21 DIAGNOSIS — Z79899 Other long term (current) drug therapy: Secondary | ICD-10-CM | POA: Insufficient documentation

## 2020-02-21 DIAGNOSIS — K922 Gastrointestinal hemorrhage, unspecified: Secondary | ICD-10-CM | POA: Diagnosis present

## 2020-02-21 DIAGNOSIS — G894 Chronic pain syndrome: Secondary | ICD-10-CM | POA: Diagnosis not present

## 2020-02-21 LAB — BUN: BUN: 18 mg/dL (ref 8–23)

## 2020-02-21 LAB — IRON AND TIBC
Iron: 41 ug/dL — ABNORMAL LOW (ref 45–182)
Saturation Ratios: 12 % — ABNORMAL LOW (ref 17.9–39.5)
TIBC: 340 ug/dL (ref 250–450)
UIBC: 299 ug/dL

## 2020-02-21 LAB — CREATININE, SERUM
Creatinine, Ser: 1.24 mg/dL (ref 0.61–1.24)
GFR calc Af Amer: >60 mL/min (ref >60)
GFR calc non Af Amer: 60 mL/min — ABNORMAL LOW (ref >60)

## 2020-02-28 LAB — MISC LABCORP TEST (SEND OUT): Labcorp test code: 738526

## 2020-03-05 ENCOUNTER — Encounter: Payer: Medicare HMO | Admitting: Pain Medicine

## 2020-03-10 ENCOUNTER — Ambulatory Visit: Payer: Medicare HMO

## 2020-03-10 ENCOUNTER — Telehealth: Payer: Self-pay | Admitting: Emergency Medicine

## 2020-03-10 NOTE — Telephone Encounter (Signed)
Pt states his CT Enterography has been rescheduled to 03/19/20. Follow up appt changed to 03/24/20. Pt voiced understanding of upcoming appts.

## 2020-03-11 ENCOUNTER — Ambulatory Visit: Payer: Medicare HMO | Admitting: Gastroenterology

## 2020-03-12 ENCOUNTER — Ambulatory Visit: Payer: Medicare HMO | Admitting: Surgery

## 2020-03-12 ENCOUNTER — Inpatient Hospital Stay: Payer: Medicare HMO | Attending: Internal Medicine

## 2020-03-12 ENCOUNTER — Inpatient Hospital Stay: Payer: Medicare HMO

## 2020-03-12 ENCOUNTER — Inpatient Hospital Stay: Payer: Medicare HMO | Admitting: Internal Medicine

## 2020-03-13 ENCOUNTER — Other Ambulatory Visit: Payer: Self-pay

## 2020-03-13 ENCOUNTER — Encounter: Payer: Self-pay | Admitting: Pain Medicine

## 2020-03-13 ENCOUNTER — Ambulatory Visit (HOSPITAL_BASED_OUTPATIENT_CLINIC_OR_DEPARTMENT_OTHER): Payer: Medicare HMO | Admitting: Pain Medicine

## 2020-03-13 ENCOUNTER — Ambulatory Visit
Admission: RE | Admit: 2020-03-13 | Discharge: 2020-03-13 | Disposition: A | Discharge status: Home / Self Care | Payer: Medicare HMO | Source: Ambulatory Visit | Attending: Pain Medicine | Admitting: Pain Medicine

## 2020-03-13 VITALS — BP 158/100 | HR 64 | Temp 98.7°F | Resp 17 | Ht 72.0 in | Wt 182.0 lb

## 2020-03-13 DIAGNOSIS — G8929 Other chronic pain: Secondary | ICD-10-CM

## 2020-03-13 DIAGNOSIS — M4310 Spondylolisthesis, site unspecified: Secondary | ICD-10-CM | POA: Diagnosis present

## 2020-03-13 DIAGNOSIS — M5416 Radiculopathy, lumbar region: Secondary | ICD-10-CM | POA: Diagnosis present

## 2020-03-13 DIAGNOSIS — M48061 Spinal stenosis, lumbar region without neurogenic claudication: Secondary | ICD-10-CM | POA: Diagnosis present

## 2020-03-13 DIAGNOSIS — M5137 Other intervertebral disc degeneration, lumbosacral region: Secondary | ICD-10-CM | POA: Insufficient documentation

## 2020-03-13 DIAGNOSIS — M5126 Other intervertebral disc displacement, lumbar region: Secondary | ICD-10-CM | POA: Insufficient documentation

## 2020-03-13 DIAGNOSIS — M5136 Other intervertebral disc degeneration, lumbar region: Secondary | ICD-10-CM

## 2020-03-13 DIAGNOSIS — M79605 Pain in left leg: Secondary | ICD-10-CM | POA: Diagnosis present

## 2020-03-13 MED ORDER — LIDOCAINE HCL 2 % IJ SOLN
20.0000 mL | Freq: Once | INTRAMUSCULAR | Status: AC
  Administered 2020-03-13: 400 mg
  Filled 2020-03-13: qty 40

## 2020-03-13 MED ORDER — LACTATED RINGERS IV SOLN: 1000.0000 mL | Freq: Once | INTRAVENOUS | Status: DC

## 2020-03-13 MED ORDER — SODIUM CHLORIDE 0.9% FLUSH
2.0000 mL | Freq: Once | INTRAVENOUS | Status: AC
  Administered 2020-03-13: 4 mL

## 2020-03-13 MED ORDER — SODIUM CHLORIDE (PF) 0.9 % IJ SOLN
INTRAMUSCULAR | Status: AC
  Filled 2020-03-13: qty 10

## 2020-03-13 MED ORDER — ROPIVACAINE HCL 2 MG/ML IJ SOLN
2.0000 mL | Freq: Once | INTRAMUSCULAR | Status: AC
  Administered 2020-03-13: 4 mL via EPIDURAL
  Filled 2020-03-13: qty 10

## 2020-03-13 MED ORDER — MIDAZOLAM HCL 5 MG/5ML IJ SOLN: 1.0000 mg | INTRAMUSCULAR | Status: DC | PRN

## 2020-03-13 MED ORDER — FENTANYL CITRATE (PF) 100 MCG/2ML IJ SOLN: 25.0000 ug | INTRAMUSCULAR | Status: DC | PRN

## 2020-03-13 MED ORDER — IOHEXOL 180 MG/ML  SOLN
10.0000 mL | Freq: Once | INTRAMUSCULAR | Status: AC
  Administered 2020-03-13: 10 mL via EPIDURAL

## 2020-03-13 MED ORDER — DEXAMETHASONE SODIUM PHOSPHATE 10 MG/ML IJ SOLN
20.0000 mg | Freq: Once | INTRAMUSCULAR | Status: AC
  Administered 2020-03-13: 20 mg
  Filled 2020-03-13: qty 2

## 2020-03-13 NOTE — Progress Notes (Signed)
PROVIDER NOTE: Information contained herein reflects review and annotations entered in association with encounter. Interpretation of such information and data should be left to medically-trained personnel. Information provided to patient can be located elsewhere in the medical record under "Patient Instructions". Document created using STT-dictation technology, any transcriptional errors that may result from process are unintentional.    Patient: Caleb Edwards  Service Category: Procedure  Provider: Oswaldo DoneFrancisco A Earnestene Angello, MD  DOB: 04/20/1951  DOS: 03/13/2020  Location: [facility]  MRN: [id number]  Setting: Ambulatory - outpatient  Referring Provider: Antonieta Lovelessuffy, Molly M, MD  Type: Established Patient  Specialty: Interventional Pain Management  PCP: Antonieta Lovelessuffy, Molly M, MD   Primary Reason for Visit: Interventional Pain Management Treatment. CC: Back Pain  Procedure:          Anesthesia, Analgesia, Anxiolysis:  Type: Trans-Foraminal Epidural Steroid Injection #3  Purpose: Diagnostic/Therapeutic Region: Posterolateral Lumbosacral Target Area: The 6 o'clock position under the pedicle, on the affected side. Approach: Posterior Percutaneous Paravertebral approach. Level: L3 & L4 Level Laterality: Bilateral Paravertebral  Type: Local Anesthesia Indication(s): Analgesia         Route: Infiltration ([place]/IM) IV Access: Declined Sedation: Declined  Local Anesthetic: Lidocaine 1-2%  Position: Prone   Indications: 1. DDD (degenerative disc disease), lumbosacral   2. 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 Grade 1 Anterolisthesis (7-759mm) of L5 over S1   4. L4-5 disc bulge   5. 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 Chronic lower extremity pain (Secondary area of Pain) (Left)    Pain Score: Pre-procedure: 7 /10 Post-procedure: 3 /10   Pre-op Assessment:  Caleb Edwards is a 69 y.o. (year old), male patient, seen today for interventional treatment. He  has a  past surgical history that includes Esophagogastroduodenoscopy (egd) with propofol (N/A, 01/03/2016); Colonoscopy with propofol (N/A, 08/02/2017); Esophagogastroduodenoscopy (egd) with propofol (08/02/2017); Esophagogastroduodenoscopy (egd) with propofol (N/A, 02/05/2018); Colonoscopy with propofol (N/A, 02/05/2018); Esophagogastroduodenoscopy (egd) with propofol (N/A, 03/30/2018); Esophagogastroduodenoscopy (egd) with propofol (N/A, 08/09/2019); and Colonoscopy with propofol (N/A, 08/09/2019). Caleb Edwards has a current medication list which includes the following prescription(s): amlodipine, atorvastatin, chlorthalidone, ferrous sulfate, lisinopril, pantoprazole, polyethylene glycol, sildenafil, and tramadol. His primarily concern today is the Back Pain  Initial Vital Signs:  Pulse/HCG Rate: 64ECG Heart Rate: 66 Temp: 98.7 F (37.1 C) Resp: 12 BP: (!) 159/83 SpO2: 100 %  BMI: Estimated body mass index is 24.68 kg/m as calculated from the following:   Height as of this encounter: 6' (1.829 m).   Weight as of this encounter: 182 lb (82.6 kg).  Risk Assessment: Allergies: Reviewed. He has No Known Allergies.  Allergy Precautions: None required Coagulopathies: Reviewed. None identified.  Blood-thinner therapy: None at this time Active Infection(s): Reviewed. None identified. Caleb Edwards is afebrile  Site Confirmation: Caleb Edwards was asked to confirm the procedure and laterality before marking the site Procedure checklist: Completed Consent: Before the procedure and under the influence of no sedative(s), amnesic(s), or anxiolytics, the patient was informed of the treatment options, risks and possible complications. To fulfill our ethical and legal obligations, as recommended by the American Medical Association's Code of Ethics, I have informed the patient of my clinical impression; the nature and purpose of the treatment or procedure; the risks, benefits, and possible complications of the intervention; the  alternatives, including doing nothing; the risk(s) and benefit(s) of the alternative treatment(s) or procedure(s); and the risk(s) and benefit(s) of doing nothing. The patient was provided information about  the general risks and possible complications associated with the procedure. These may include, but are not limited to: failure to achieve desired goals, infection, bleeding, organ or nerve damage, allergic reactions, paralysis, and death. In addition, the patient was informed of those risks and complications associated to Spine-related procedures, such as failure to decrease pain; infection (i.e.: Meningitis, epidural or intraspinal abscess); bleeding (i.e.: epidural hematoma, subarachnoid hemorrhage, or any other type of intraspinal or peri-dural bleeding); organ or nerve damage (i.e.: Any type of peripheral nerve, nerve root, or spinal cord injury) with subsequent damage to sensory, motor, and/or autonomic systems, resulting in permanent pain, numbness, and/or weakness of one or several areas of the body; allergic reactions; (i.e.: anaphylactic reaction); and/or death. Furthermore, the patient was informed of those risks and complications associated with the medications. These include, but are not limited to: allergic reactions (i.e.: anaphylactic or anaphylactoid reaction(s)); adrenal axis suppression; blood sugar elevation that in diabetics may result in ketoacidosis or comma; water retention that in patients with history of congestive heart failure may result in shortness of breath, pulmonary edema, and decompensation with resultant heart failure; weight gain; swelling or edema; medication-induced neural toxicity; particulate matter embolism and blood vessel occlusion with resultant organ, and/or nervous system infarction; and/or aseptic necrosis of one or more joints. Finally, the patient was informed that Medicine is not an exact science; therefore, there is also the possibility of unforeseen or  unpredictable risks and/or possible complications that may result in a catastrophic outcome. The patient indicated having understood very clearly. We have given the patient no guarantees and we have made no promises. Enough time was given to the patient to ask questions, all of which were answered to the patient's satisfaction. Mr. Farra has indicated that he wanted to continue with the procedure. Attestation: I, the ordering provider, attest that I have discussed with the patient the benefits, risks, side-effects, alternatives, likelihood of achieving goals, and potential problems during recovery for the procedure that I have provided informed consent. Date  Time: 03/13/2020  8:52 AM  Pre-Procedure Preparation:  Monitoring: As per clinic protocol. Respiration, ETCO2, SpO2, BP, heart rate and rhythm monitor placed and checked for adequate function Safety Precautions: Patient was assessed for positional comfort and pressure points before starting the procedure. Time-out: I initiated and conducted the "Time-out" before starting the procedure, as per protocol. The patient was asked to participate by confirming the accuracy of the "Time Out" information. Verification of the correct person, site, and procedure were performed and confirmed by me, the nursing staff, and the patient. "Time-out" conducted as per Joint Commission's Universal Protocol (UP.01.01.01). Time: 0936  Description of Procedure:          Area Prepped: Entire Posterior Lumbosacral Area DuraPrep (Iodine Povacrylex [0.7% available iodine] and Isopropyl Alcohol, 74% w/w) Safety Precautions: Aspiration looking for blood return was conducted prior to all injections. At no point did we inject any substances, as a needle was being advanced. No attempts were made at seeking any paresthesias. Safe injection practices and needle disposal techniques used. Medications properly checked for expiration dates. SDV (single dose vial) medications  used. Description of the Procedure: Protocol guidelines were followed. The patient was placed in position over the procedure table. The target area was identified and the area prepped in the usual manner. Skin & deeper tissues infiltrated with local anesthetic. Appropriate amount of time allowed to pass for local anesthetics to take effect. The procedure needles were then advanced to the target area. Proper needle placement  secured. Negative aspiration confirmed. Solution injected in intermittent fashion, asking for systemic symptoms every 0.5cc of injectate. The needles were then removed and the area cleansed, making sure to leave some of the prepping solution back to take advantage of its long term bactericidal properties.  Vitals:   03/13/20 0935 03/13/20 0940 03/13/20 0945 03/13/20 0951  BP: (!) 161/85 (!) 168/79 (!) 159/97 (!) 158/100  Pulse:      Resp: 12 17 18 17   Temp:      SpO2: 100% 99% 99% 99%  Weight:      Height:        Start Time: 0937 hrs. End Time: 0951 hrs.  Materials:  Needle(s) Type: Spinal Needle Gauge: 22G Length: 3.5-in Medication(s): Please see orders for medications and dosing details.  Imaging Guidance (Spinal):          Type of Imaging Technique: Fluoroscopy Guidance (Spinal) Indication(s): Assistance in needle guidance and placement for procedures requiring needle placement in or near specific anatomical locations not easily accessible without such assistance. Exposure Time: Please see nurses notes. Contrast: Before injecting any contrast, we confirmed that the patient did not have an allergy to iodine, shellfish, or radiological contrast. Once satisfactory needle placement was completed at the desired level, radiological contrast was injected. Contrast injected under live fluoroscopy. No contrast complications. See chart for type and volume of contrast used. Fluoroscopic Guidance: I was personally present during the use of fluoroscopy. "Tunnel Vision Technique"  used to obtain the best possible view of the target area. Parallax error corrected before commencing the procedure. "Direction-depth-direction" technique used to introduce the needle under continuous pulsed fluoroscopy. Once target was reached, antero-posterior, oblique, and lateral fluoroscopic projection used confirm needle placement in all planes. Images permanently stored in EMR. Interpretation: I personally interpreted the imaging intraoperatively. Adequate needle placement confirmed in multiple planes. Appropriate spread of contrast into desired area was observed. No evidence of afferent or efferent intravascular uptake. No intrathecal or subarachnoid spread observed. Permanent images saved into the patient's record.  Antibiotic Prophylaxis:   Anti-infectives (From admission, onward)   None     Indication(s): None identified  Post-operative Assessment:  Post-procedure Vital Signs:  Pulse/HCG Rate: 6464 Temp: 98.7 F (37.1 C) Resp: 17 BP: (!) 158/100 SpO2: 99 %  EBL: None  Complications: No immediate post-treatment complications observed by team, or reported by patient.  Note: The patient tolerated the entire procedure well. A repeat set of vitals were taken after the procedure and the patient was kept under observation following institutional policy, for this type of procedure. Post-procedural neurological assessment was performed, showing return to baseline, prior to discharge. The patient was provided with post-procedure discharge instructions, including a section on how to identify potential problems. Should any problems arise concerning this procedure, the patient was given instructions to immediately contact us, at any time, without hesitation. In any case, we plan to contact the patient by telephone for a follow-up status report regarding this interventional procedure.  Comments:  No additional relevant information.  Plan of Care  Orders:  Orders Placed This Encounter   Procedures  . Lumbar Transforaminal Epidural    Scheduling Instructions:     Side: Bilateral     Level: L3 & L4     Sedation: Patient's choice.     Timeframe: Today    Order Specific Question:   Where will this procedure be performed?    Answer:   [facility]  . DG PAIN [facility] C-ARM 1-60 MIN NO REPORT  Intraoperative interpretation by procedural physician at [facility].    Standing Status:   Standing    Number of Occurrences:   1    Order Specific Question:   Reason for exam:    Answer:   Assistance in needle guidance and placement for procedures requiring needle placement in or near specific anatomical locations not easily accessible without such assistance.  . Informed Consent Details: Physician/Practitioner Attestation; Transcribe to consent form and obtain patient signature    Provider Attestation: I, Lavelle Akel A. Laban Emperor, MD, (Pain Management Specialist), the physician/practitioner, attest that I have discussed with the patient the benefits, risks, side effects, alternatives, likelihood of achieving goals and potential problems during recovery for the procedure that I have provided informed consent.    Scheduling Instructions:     Procedure: Diagnostic lumbar transforaminal epidural steroid injection under fluoroscopic guidance. (See notes for level and laterality.)     Indication/Reason: Lumbar radiculopathy/radiculitis associated with lumbar stenosis     Note: Always confirm laterality of pain with Mr. Brannigan, before procedure.     Transcribe to consent form and obtain patient signature.  . Provide equipment / supplies at bedside    Equipment required: Single use, disposable, "Block Tray"    Standing Status:   Standing    Number of Occurrences:   1    Order Specific Question:   Specify    Answer:   Block Tray   Chronic Opioid Analgesic:  Tramadol 50 mg, 2 tabs PO q 6 hrs (400 mg/day of tramadol) MME/day:40 mg/day.   Medications ordered for  procedure: Meds ordered this encounter  Medications  . iohexol (OMNIPAQUE) 180 MG/ML injection 10 mL    Must be Myelogram-compatible. If not available, you may substitute with a water-soluble, non-ionic, hypoallergenic, myelogram-compatible radiological contrast medium.  Marland Kitchen lidocaine (XYLOCAINE) 2 % (with pres) injection 400 mg  . DISCONTD: lactated ringers infusion 1,000 mL  . DISCONTD: midazolam (VERSED) 5 MG/5ML injection 1-2 mg    Make sure Flumazenil is available in the pyxis when using this medication. If oversedation occurs, administer 0.2 mg IV over 15 sec. If after 45 sec no response, administer 0.2 mg again over 1 min; may repeat at 1 min intervals; not to exceed 4 doses (1 mg)  . DISCONTD: fentaNYL (SUBLIMAZE) injection 25-50 mcg    Make sure Narcan is available in the pyxis when using this medication. In the event of respiratory depression (RR< 8/min): Titrate NARCAN (naloxone) in increments of 0.1 to 0.2 mg IV at 2-3 minute intervals, until desired degree of reversal.  . sodium chloride flush (NS) 0.9 % injection 2 mL  . ropivacaine (PF) 2 mg/mL (0.2%) (NAROPIN) injection 2 mL  . dexamethasone (DECADRON) injection 20 mg   Medications administered: We administered iohexol, lidocaine, sodium chloride flush, ropivacaine (PF) 2 mg/mL (0.2%), and dexamethasone.  See the medical record for exact dosing, route, and time of administration.  Follow-up plan:   Return in about 2 weeks (around 03/27/2020) for (VV), (PP).       Interventional treatment options:  Under consideration:   Diagnostic right IA hip joint injection  Diagnostic bilateral lumbar facet block  Possible bilateral lumbar facet RFA    Therapeutic/palliative (PRN):   Palliative left L3-4 LESI #3  Palliative left L4 TFESI #2  Palliative right L3 TFESI #2  Palliative right L4 TFESI #3      Recent Visits Date Type Provider Dept  02/18/20 Telemedicine Delano Metz, MD [facility]  Showing recent  visits  within past 90 days and meeting all other requirements Today's Visits Date Type Provider Dept  03/13/20 Procedure visit Delano Metz, MD [facility]  Showing today's visits and meeting all other requirements Future Appointments Date Type Provider Dept  03/27/20 Appointment Delano Metz, MD [facility]  Showing future appointments within next 90 days and meeting all other requirements  Disposition: Discharge home  Discharge (Date  Time): 03/13/2020; 1005 hrs.   Primary Care Physician: Duffy, Barnabas Lister, MD Location: [facility] Note by: Oswaldo Done, MD Date: 03/13/2020; Time: 10:00 AM  Disclaimer:  Medicine is not an Visual merchandiser. The only guarantee in medicine is that nothing is guaranteed. It is important to note that the decision to proceed with this intervention was based on the information collected from the patient. The Data and conclusions were drawn from the patient's questionnaire, the interview, and the physical examination. Because the information was provided in large part by the patient, it cannot be guaranteed that it has not been purposely or unconsciously manipulated. Every effort has been made to obtain as much relevant data as possible for this evaluation. It is important to note that the conclusions that lead to this procedure are derived in large part from the available data. Always take into account that the treatment will also be dependent on availability of resources and existing treatment guidelines, considered by other Pain Management Practitioners as being common knowledge and practice, at the time of the intervention. For Medico-Legal purposes, it is also important to point out that variation in procedural techniques and pharmacological choices are the acceptable norm. The indications, contraindications, technique, and results of the above procedure should only be interpreted and judged by a  Board-Certified Interventional Pain Specialist with extensive familiarity and expertise in the same exact procedure and technique.

## 2020-03-13 NOTE — Patient Instructions (Addendum)
____________________________________________________________________________________________  Post-Procedure Discharge Instructions  Instructions:  Apply ice:   Purpose: This will minimize any swelling and discomfort after procedure.   When: Day of procedure, as soon as you get home.  How: Fill a plastic sandwich bag with crushed ice. Cover it with a small towel and apply to injection site.  How long: (15 min on, 15 min off) Apply for 15 minutes then remove x 15 minutes.  Repeat sequence on day of procedure, until you go to bed.  Apply heat:   Purpose: To treat any soreness and discomfort from the procedure.  When: Starting the next day after the procedure.  How: Apply heat to procedure site starting the day following the procedure.  How long: May continue to repeat daily, until discomfort goes away.  Food intake: Start with clear liquids (like water) and advance to regular food, as tolerated.   Physical activities: Keep activities to a minimum for the first 8 hours after the procedure. After that, then as tolerated.  Driving: If you have received any sedation, be responsible and do not drive. You are not allowed to drive for 24 hours after having sedation.  Blood thinner: (Applies only to those taking blood thinners) You may restart your blood thinner 6 hours after your procedure.  Insulin: (Applies only to Diabetic patients taking insulin) As soon as you can eat, you may resume your normal dosing schedule.  Infection prevention: Keep procedure site clean and dry. Shower daily and clean area with soap and water.  Post-procedure Pain Diary: Extremely important that this be done correctly and accurately. Recorded information will be used to determine the next step in treatment. For the purpose of accuracy, follow these rules:  Evaluate only the area treated. Do not report or include pain from an untreated area. For the purpose of this evaluation, ignore all other areas of pain,  except for the treated area.  After your procedure, avoid taking a long nap and attempting to complete the pain diary after you wake up. Instead, set your alarm clock to go off every hour, on the hour, for the initial 8 hours after the procedure. Document the duration of the numbing medicine, and the relief you are getting from it.  Do not go to sleep and attempt to complete it later. It will not be accurate. If you received sedation, it is likely that you were given a medication that may cause amnesia. Because of this, completing the diary at a later time may cause the information to be inaccurate. This information is needed to plan your care.  Follow-up appointment: Keep your post-procedure follow-up evaluation appointment after the procedure (usually 2 weeks for most procedures, 6 weeks for radiofrequencies). DO NOT FORGET to bring you pain diary with you.   Expect: (What should I expect to see with my procedure?)  From numbing medicine (AKA: Local Anesthetics): Numbness or decrease in pain. You may also experience some weakness, which if present, could last for the duration of the local anesthetic.  Onset: Full effect within 15 minutes of injected.  Duration: It will depend on the type of local anesthetic used. On the average, 1 to 8 hours.   From steroids (Applies only if steroids were used): Decrease in swelling or inflammation. Once inflammation is improved, relief of the pain will follow.  Onset of benefits: Depends on the amount of swelling present. The more swelling, the longer it will take for the benefits to be seen. In some cases, up to 10 days.    Duration: Steroids will stay in the system x 2 weeks. Duration of benefits will depend on multiple posibilities including persistent irritating factors.  Side-effects: If present, they may typically last 2 weeks (the duration of the steroids).  Frequent: Cramps (if they occur, drink Gatorade and take over-the-counter Magnesium 450-500 mg  once to twice a day); water retention with temporary weight gain; increases in blood sugar; decreased immune system response; increased appetite.  Occasional: Facial flushing (red, warm cheeks); mood swings; menstrual changes.  Uncommon: Long-term decrease or suppression of natural hormones; bone thinning. (These are more common with higher doses or more frequent use. This is why we prefer that our patients avoid having any injection therapies in other practices.)   Very Rare: Severe mood changes; psychosis; aseptic necrosis.  From procedure: Some discomfort is to be expected once the numbing medicine wears off. This should be minimal if ice and heat are applied as instructed.  Call if: (When should I call?)  You experience numbness and weakness that gets worse with time, as opposed to wearing off.  New onset bowel or bladder incontinence. (Applies only to procedures done in the spine)  Emergency Numbers:  Durning business hours (Monday - Thursday, 8:00 AM - 4:00 PM) (Friday, 9:00 AM - 12:00 Noon): [phone]  After hours: [phone]  NOTE: If you are having a problem and are unable connect with, or to talk to a provider, then go to your nearest urgent care or emergency department. If the problem is serious and urgent, please call 911. ____________________________________________________________________________________________   Selective Nerve Root Block Patient Information  Description: Specific nerve roots exit the spinal canal and these nerves can be compressed and inflamed by a bulging disc and bone spurs.  By injecting steroids on the nerve root, we can potentially decrease the inflammation surrounding these nerves, which often leads to decreased pain.  Also, by injecting local anesthesia on the nerve root, this can provide [place] helpful information to give to your referring doctor if it decreases your pain.  Selective nerve root blocks can be done along the spine from the  neck to the low back depending on the location of your pain.   After numbing the skin with local anesthesia, a small needle is passed to the nerve root and the position of the needle is verified using x-ray pictures.  After the needle is in correct position, we then deposit the medication.  You may experience a pressure sensation while this is being done.  The entire block usually lasts less than 15 minutes.  Conditions that may be treated with selective nerve root blocks:  Low back and leg pain  Spinal stenosis  Diagnostic block prior to potential surgery  Neck and arm pain  Post laminectomy syndrome  Preparation for the injection:  1. Do not eat any solid food or dairy products within 8 hours of your appointment. 2. You may drink clear liquids up to 3 hours before an appointment.  Clear liquids include water, black coffee, juice or soda.  No milk or cream please. 3. You may take your regular medications, including pain medications, with a sip of water before your appointment.  Diabetics should hold regular insulin (if taken separately) and take 1/2 normal NPH dose the morning of the procedure.  Carry some sugar containing items with you to your appointment. 4. A driver must accompany you and be prepared to drive you home after your procedure. 5. Bring all your current medications with you. 6. An IV  may be inserted and sedation may be given at the discretion of the physician. 7. A blood pressure cuff, EKG, and other monitors will often be applied during the procedure.  Some patients may need to have extra oxygen administered for a short period. 8. You will be asked to provide medical information, including allergies, prior to the procedure.  We must know immediately if you are taking blood  Thinners (like Coumadin) or if you are allergic to IV iodine contrast (dye).  Possible side-effects: All are usually temporary  Bleeding from needle site  Light headedness  Numbness and  tingling  Decreased blood pressure  Weakness in arms/legs  Pressure sensation in back/neck  Pain at injection site (several days)  Possible complications: All are extremely rare  Infection  Nerve injury  Spinal headache (a headache wore with upright position)  Call if you experience:  Fever/chills associated with headache or increased back/neck pain  Headache worsened by an upright position  New onset weakness or numbness of an extremity below the injection site  Hives or difficulty breathing (go to the emergency room)  Inflammation or drainage at the injection site(s)  Severe back/neck pain greater than usual  New symptoms which are concerning to you  Please note:  Although the local anesthetic injected can often make your back or neck feel good for several hours after the injection the pain will likely return.  It takes 3-5 days for steroids to work on the nerve root. You may not notice any pain relief for at least one week.  If effective, we will often do a series of 3 injections spaced 3-6 weeks apart to maximally decrease your pain.    If you have any questions, please call [phone] [facility] Discharge Instructions  General Discharge Instructions :  If you need to reach your doctor call: Monday-Friday 8:00 am - 4:00 pm at [phone] or toll free [phone].  After clinic hours [phone] to have operator reach doctor.  Bring all of your medication bottles to all your appointments in the pain clinic.  To cancel or reschedule your appointment with Pain Management please remember to call 24 hours in advance to avoid a fee.  Refer to the educational materials which you have been given on: General Risks, I had my Procedure. Discharge Instructions, Post Sedation.  Post Procedure Instructions:  The drugs you were given will stay in your system until tomorrow, so for the next 24 hours you should not  drive, make any legal decisions or drink any alcoholic beverages.  You may eat anything you prefer, but it is better to start with liquids then soups and crackers, and gradually work up to solid foods.  Please notify your doctor immediately if you have any unusual bleeding, trouble breathing or pain that is not related to your normal pain.  Depending on the type of procedure that was done, some parts of your body may feel week and/or numb.  This usually clears up by tonight or the next day.  Walk with the use of an assistive device or accompanied by an adult for the 24 hours.  You may use ice on the affected area for the first 24 hours.  Put ice in a Ziploc bag and cover with a towel and place against area 15 minutes on 15 minutes off.  You may switch to heat after 24 hours.

## 2020-03-14 ENCOUNTER — Telehealth: Payer: Self-pay

## 2020-03-14 NOTE — Telephone Encounter (Signed)
Post procedure phone call.  Patient states he is doing pretty good this morning.

## 2020-03-18 ENCOUNTER — Telehealth: Payer: Self-pay | Admitting: *Deleted

## 2020-03-18 NOTE — Telephone Encounter (Signed)
Patient called and wants to get a clear understanding why he is having a CT scan since him and Dr Everlene Farrier has not wanting to proceed with surgery right now.

## 2020-03-18 NOTE — Telephone Encounter (Signed)
Patient called to confirm Ct scan - appointment tomorrow- he was asked to arrive 12:30 for prep. Reminded of follow up with Dr.Pabon 6/21. Patient verbalized understanding.

## 2020-03-19 ENCOUNTER — Other Ambulatory Visit: Payer: Self-pay

## 2020-03-19 ENCOUNTER — Ambulatory Visit
Admission: RE | Admit: 2020-03-19 | Discharge: 2020-03-19 | Disposition: A | Discharge status: Home / Self Care | Payer: Medicare HMO | Source: Ambulatory Visit | Attending: Surgery | Admitting: Surgery

## 2020-03-19 DIAGNOSIS — K922 Gastrointestinal hemorrhage, unspecified: Secondary | ICD-10-CM

## 2020-03-19 MED ORDER — IOHEXOL 300 MG/ML  SOLN
100.0000 mL | Freq: Once | INTRAMUSCULAR | Status: AC | PRN
  Administered 2020-03-19: 100 mL via INTRAVENOUS

## 2020-03-21 ENCOUNTER — Telehealth: Payer: Self-pay

## 2020-03-21 NOTE — Telephone Encounter (Signed)
Dr. Tobi Bastos.  [name] informed Joni Reining that he would like to reschedule a colonoscopy due to he was not able to have COVID test done. I don't see where he was ordered a colonoscopy.  Can you advise please.  Thanks,  [place], [place]

## 2020-03-23 NOTE — Telephone Encounter (Signed)
Needs repeat EGD with capsule placement at the same time

## 2020-03-24 ENCOUNTER — Ambulatory Visit: Payer: Medicare HMO | Admitting: Surgery

## 2020-03-25 ENCOUNTER — Other Ambulatory Visit: Payer: Self-pay

## 2020-03-25 DIAGNOSIS — K315 Obstruction of duodenum: Secondary | ICD-10-CM

## 2020-03-25 DIAGNOSIS — D508 Other iron deficiency anemias: Secondary | ICD-10-CM

## 2020-03-26 ENCOUNTER — Encounter: Payer: Self-pay | Admitting: Pain Medicine

## 2020-03-26 ENCOUNTER — Telehealth: Payer: Self-pay | Admitting: Gastroenterology

## 2020-03-26 NOTE — Telephone Encounter (Signed)
I called patient to see if he wanted to r/s his office appointment. We then discussed he scheduled a procedure with DR Tobi Bastos for 04-26-20 & we have 04-17-20. Please verify with patient.

## 2020-03-26 NOTE — Progress Notes (Signed)
Patient: Caleb Edwards  Service Category: E/M  Provider: Gaspar Cola, MD  DOB: 1951-04-08  DOS: 03/27/2020  Location: Office  MRN: [id number]  Setting: Ambulatory outpatient  Referring Provider: Cherylann Parr, MD  Type: Established Patient  Specialty: Interventional Pain Management  PCP: Cherylann Parr, MD  Location: Remote location  Delivery: TeleHealth     Virtual Encounter - Pain Management PROVIDER NOTE: Information contained herein reflects review and annotations entered in association with encounter. Interpretation of such information and data should be left to medically-trained personnel. Information provided to patient can be located elsewhere in the medical record under "Patient Instructions". Document created using STT-dictation technology, any transcriptional errors that may result from process are unintentional.    Contact & Pharmacy Preferred: [phone] Home: [phone] (home) Mobile: [phone] (mobile) E-mail: [email or web address]  [facility] [address]), [place]) [place] [address] Phone: [phone] Fax: [phone]   Pre-screening  Caleb Edwards offered "in-person" vs "virtual" encounter. He indicated preferring virtual for this encounter.   Reason COVID-19*  Social distancing based on CDC and AMA recommendations.   I contacted Caleb Edwards on 03/27/2020 via telephone.      I clearly identified myself as Gaspar Cola, MD. I verified that I was speaking with the correct person using two identifiers (Name: Caleb Edwards, and date of birth: Jan 04, 1951).  Consent I sought verbal advanced consent from Caleb Edwards for virtual visit interactions. I informed Caleb Edwards of possible security and privacy concerns, risks, and limitations associated with providing "not-in-person" medical evaluation and management services. I also informed Caleb Edwards of the availability of  "in-person" appointments. Finally, I informed him that there would be a charge for the virtual visit and that he could be  personally, fully or partially, financially responsible for it. Caleb Edwards expressed understanding and agreed to proceed.   Historic Elements   Caleb Edwards is a 69 y.o. year old, male patient evaluated today after his last contact with our practice on 03/14/2020. Caleb Edwards  has a past medical history of Back pain, Blood transfusion without reported diagnosis (12/2015), Diverticulitis, Hypertension, and Ulcer, stomach peptic, chronic. He also  has a past surgical history that includes Esophagogastroduodenoscopy (egd) with propofol (N/A, 01/03/2016); Colonoscopy with propofol (N/A, 08/02/2017); Esophagogastroduodenoscopy (egd) with propofol (08/02/2017); Esophagogastroduodenoscopy (egd) with propofol (N/A, 02/05/2018); Colonoscopy with propofol (N/A, 02/05/2018); Esophagogastroduodenoscopy (egd) with propofol (N/A, 03/30/2018); Esophagogastroduodenoscopy (egd) with propofol (N/A, 08/09/2019); and Colonoscopy with propofol (N/A, 08/09/2019). Caleb Edwards has a current medication list which includes the following prescription(s): amlodipine, atorvastatin, chlorthalidone, ferrous sulfate, lisinopril, pantoprazole, polyethylene glycol, sildenafil, and tramadol. He  reports that he has quit smoking. He has never used smokeless tobacco. He reports that he does not drink alcohol and does not use drugs. Caleb Edwards has No Known Allergies.   HPI  Today, he is being contacted for a post-procedure assessment.  Today the patient indicates having done well with the last injection and not needing anything else done for now.  Unfortunately, we also spent quite a bit of time on the phone due to the fact that according to my records his last UDS was done in 2018, but he actually did have another one done on 02/21/2020 that was within normal limits.  The problem was that for some reason it was scanned into the  system as a miscellaneous lab with a report returning in paper format and having been scanned  in, instead of reported in the usual manner.  Post-Procedure Evaluation  Procedure: (03/13/2020) diagnostic/therapeutic bilateral L3 & L4 TFESI #3 under fluoroscopic guidance, no sedation Pre-procedure pain level: 7/10 Post-procedure: 3/10 (> 50% relief)  Sedation: None.  Effectiveness during initial hour after procedure(Ultra-Short Term Relief): 95 %.  Local anesthetic used: Long-acting (4-6 hours) Effectiveness: Defined as any analgesic benefit obtained secondary to the administration of local anesthetics. This carries significant diagnostic value as to the etiological location, or anatomical origin, of the pain. Duration of benefit is expected to coincide with the duration of the local anesthetic used.  Effectiveness during initial 4-6 hours after procedure(Short-Term Relief): 95 %.  Long-term benefit: Defined as any relief past the pharmacologic duration of the local anesthetics.  Effectiveness past the initial 6 hours after procedure(Long-Term Relief): 60 %.  Current benefits: Defined as benefit that persist at this time.   Analgesia:  >50% relief Function: Caleb Edwards reports improvement in function ROM: Caleb Edwards reports improvement in ROM  Pharmacotherapy Assessment  Analgesic: Tramadol 50 mg, 2 tabs PO q 6 hrs (400 mg/day of tramadol) (LAST UDS DONE 02/21/2020. Scanned wrong into system) MME/day:40 mg/day.   Monitoring: Caleb Edwards PMP: PDMP reviewed during this encounter.       Pharmacotherapy: No side-effects or adverse reactions reported. Compliance: No problems identified. Effectiveness: Clinically acceptable. Plan: Refer to "POC".  UDS:  Summary  Date Value Ref Range Status  03/24/2017 FINAL  Final    Comment:    ==================================================================== TOXASSURE SELECT 13 (MW) ==================================================================== Test                              Result       Flag       Units Drug Present   Tramadol                       PRESENT   O-Desmethyltramadol            PRESENT   N-Desmethyltramadol            PRESENT    Source of tramadol is a prescription medication.    O-desmethyltramadol and N-desmethyltramadol are expected    metabolites of tramadol. ==================================================================== Test                      Result    Flag   Units      Ref Range   Creatinine              125              mg/dL      >=20 ==================================================================== Declared Medications:  Medication list was not provided. ==================================================================== For clinical consultation, please call [phone]. ====================================================================     Laboratory Chemistry Profile   Renal Lab Results  Component Value Date   BUN 18 02/21/2020   CREATININE 1.24 02/21/2020   GFRAA >60 02/21/2020   GFRNONAA 60 (L) 02/21/2020     Hepatic Lab Results  Component Value Date   AST 23 08/09/2019   ALT 15 08/09/2019   ALBUMIN 3.8 08/09/2019   ALKPHOS 53 08/09/2019   LIPASE 17 08/08/2019     Electrolytes Lab Results  Component Value Date   NA 138 08/09/2019   K 3.9 08/09/2019   CL 106 08/09/2019   CALCIUM 8.7 (L) 08/09/2019   MG 1.9 01/04/2016     Bone Lab Results  Component Value  Date   VD25OH 24.0 (L) 12/26/2015     Inflammation (CRP: Acute Phase) (ESR: Chronic Phase) Lab Results  Component Value Date   CRP 0.6 12/26/2015   ESRSEDRATE 3 12/26/2015       Note: Above Lab results reviewed.   Imaging  CT ENTERO ABD/PELVIS W CONTAST CLINICAL DATA:  Lower GI bleeding.  Recurrent diverticulitis.  EXAM: CT ABDOMEN AND PELVIS WITH CONTRAST (ENTEROGRAPHY)  TECHNIQUE: Multidetector CT of the abdomen and pelvis during bolus administration of intravenous contrast. Negative oral  contrast was given.  CONTRAST:  128m OMNIPAQUE IOHEXOL 300 MG/ML  SOLN  COMPARISON:  01/23/2018  FINDINGS: Lower Chest: No acute findings.  Hepatobiliary: No hepatic masses identified. Gallbladder is unremarkable. No evidence of biliary ductal dilatation.  Pancreas:  No mass or inflammatory changes.  Spleen: Within normal limits in size and appearance.  Adrenals/Urinary Tract: No masses identified. A few tiny renal cysts are again seen bilaterally. Small less than 1 cm calculi are again seen in both kidneys. No evidence of ureteral calculi or hydronephrosis.  Stomach/Bowel: No evidence of bowel wall thickening, abnormal contrast enhancement, or dilatation. No evidence of mesenteric inflammatory changes, enteric fistula, or abnormal fluid collections. The terminal ileum is normal in appearance. Severe diffuse colonic diverticulosis is again seen, however there is no evidence of diverticulitis.  Vascular/Lymphatic: No pathologically enlarged lymph nodes. No abdominal aortic aneurysm. Aortic atherosclerosis noted.  Reproductive:  Stable mildly enlarged prostate.  Other:  None.  Musculoskeletal:  No suspicious bone lesions identified.  IMPRESSION: 1. No radiographic evidence of inflammatory bowel disease or other acute findings. 2. Severe colonic diverticulosis, without radiographic evidence of diverticulitis. 3. Bilateral nephrolithiasis. No evidence of ureteral calculi or hydronephrosis. 4. Stable mildly enlarged prostate.  Aortic Atherosclerosis (ICD10-I70.0).  Electronically Signed   By: JMarlaine HindM.D.   On: 03/19/2020 14:16  Assessment  The primary encounter diagnosis was Chronic pain syndrome. Diagnoses of Chronic low back pain (Primary Area of Pain) (Bilateral) (R>L), Chronic lower extremity pain (Secondary area of Pain) (Left), Uncomplicated opioid dependence (HCC), DDD (degenerative disc disease), lumbosacral, Grade 1 Anterolisthesis (7-951m of L5 over  S1, L4-5 disc bulge, and Pharmacologic therapy were also pertinent to this visit.  Plan of Care  Problem-specific:  No problem-specific Assessment & Plan notes found for this encounter.  Mr. WiTARIS GALINDOas a current medication list which includes the following long-term medication(s): atorvastatin, chlorthalidone, lisinopril, pantoprazole, and tramadol.  Pharmacotherapy (Medications Ordered): No orders of the defined types were placed in this encounter.  Orders:  No orders of the defined types were placed in this encounter.  Follow-up plan:   Return in about 6 months (around 10/01/2020) for (F2F), (MM), regular appointment.      Interventional treatment options:  Under consideration:   Diagnostic right IA hip joint injection  Diagnostic bilateral lumbar facet block  Possible bilateral lumbar facet RFA    Therapeutic/palliative (PRN):   Palliative left L3-4 LESI #3  Palliative left L4 TFESI #2  Palliative right L3 TFESI #2  Palliative right L4 TFESI #3       Recent Visits Date Type Provider Dept  03/13/20 Procedure visit NaMilinda PointerMD [facility]  02/18/20 Telemedicine NaMilinda PointerMD [facility]  Showing recent visits within past 90 days and meeting all other requirements Today's Visits Date Type Provider Dept  03/27/20 Telemedicine NaMilinda PointerMD [facility]  Showing today's visits and meeting all other requirements Future Appointments No visits were found meeting these  conditions. Showing future appointments within next 90 days and meeting all other requirements  I discussed the assessment and treatment plan with the patient. The patient was provided an opportunity to ask questions and all were answered. The patient agreed with the plan and demonstrated an understanding of the instructions.  Patient advised to call back or seek an in-person evaluation if the symptoms or condition worsens.  Duration of  encounter: 25 minutes.  Note by: Gaspar Cola, MD Date: 03/27/2020; Time: 2:39 PM

## 2020-03-27 ENCOUNTER — Ambulatory Visit: Payer: Medicare HMO | Attending: Pain Medicine | Admitting: Pain Medicine

## 2020-03-27 ENCOUNTER — Other Ambulatory Visit: Payer: Self-pay

## 2020-03-27 DIAGNOSIS — G894 Chronic pain syndrome: Secondary | ICD-10-CM | POA: Diagnosis not present

## 2020-03-27 DIAGNOSIS — M5126 Other intervertebral disc displacement, lumbar region: Secondary | ICD-10-CM

## 2020-03-27 DIAGNOSIS — M5136 Other intervertebral disc degeneration, lumbar region: Secondary | ICD-10-CM

## 2020-03-27 DIAGNOSIS — M5137 Other intervertebral disc degeneration, lumbosacral region: Secondary | ICD-10-CM

## 2020-03-27 DIAGNOSIS — M5442 Lumbago with sciatica, left side: Secondary | ICD-10-CM

## 2020-03-27 DIAGNOSIS — M79605 Pain in left leg: Secondary | ICD-10-CM

## 2020-03-27 DIAGNOSIS — F1120 Opioid dependence, uncomplicated: Secondary | ICD-10-CM | POA: Insufficient documentation

## 2020-03-27 DIAGNOSIS — M4310 Spondylolisthesis, site unspecified: Secondary | ICD-10-CM

## 2020-03-27 DIAGNOSIS — G8929 Other chronic pain: Secondary | ICD-10-CM

## 2020-03-27 DIAGNOSIS — Z79899 Other long term (current) drug therapy: Secondary | ICD-10-CM

## 2020-03-27 NOTE — Telephone Encounter (Signed)
Spoke with pt and reminded him that he chose July 15 for his procedure July 24 is a Saturday. Pt then requested to reschedule the procedure to July 22nd.

## 2020-04-14 ENCOUNTER — Ambulatory Visit: Payer: Medicare HMO | Admitting: Surgery

## 2020-04-22 ENCOUNTER — Other Ambulatory Visit: Payer: Self-pay

## 2020-04-22 ENCOUNTER — Other Ambulatory Visit
Admission: RE | Admit: 2020-04-22 | Discharge: 2020-04-22 | Disposition: A | Discharge status: Home / Self Care | Payer: Medicare HMO | Source: Ambulatory Visit | Attending: Gastroenterology | Admitting: Gastroenterology

## 2020-04-22 DIAGNOSIS — Z01812 Encounter for preprocedural laboratory examination: Secondary | ICD-10-CM | POA: Insufficient documentation

## 2020-04-22 DIAGNOSIS — Z20822 Contact with and (suspected) exposure to covid-19: Secondary | ICD-10-CM | POA: Diagnosis not present

## 2020-04-22 LAB — SARS CORONAVIRUS 2 (TAT 6-24 HRS): SARS Coronavirus 2: NEGATIVE

## 2020-04-24 ENCOUNTER — Ambulatory Visit: Payer: Medicare HMO | Admitting: Anesthesiology

## 2020-04-24 ENCOUNTER — Ambulatory Visit
Admission: RE | Admit: 2020-04-24 | Discharge: 2020-04-24 | Disposition: A | Discharge status: Home / Self Care | Payer: Medicare HMO | Attending: Gastroenterology | Admitting: Gastroenterology

## 2020-04-24 ENCOUNTER — Other Ambulatory Visit: Payer: Self-pay

## 2020-04-24 ENCOUNTER — Encounter
Admission: RE | Disposition: A | Discharge status: Home / Self Care | Payer: Self-pay | Source: Home / Self Care | Attending: Gastroenterology

## 2020-04-24 ENCOUNTER — Encounter: Payer: Self-pay | Admitting: Gastroenterology

## 2020-04-24 DIAGNOSIS — D508 Other iron deficiency anemias: Secondary | ICD-10-CM

## 2020-04-24 DIAGNOSIS — Z79899 Other long term (current) drug therapy: Secondary | ICD-10-CM | POA: Diagnosis not present

## 2020-04-24 DIAGNOSIS — I10 Essential (primary) hypertension: Secondary | ICD-10-CM | POA: Diagnosis not present

## 2020-04-24 DIAGNOSIS — K311 Adult hypertrophic pyloric stenosis: Secondary | ICD-10-CM | POA: Insufficient documentation

## 2020-04-24 DIAGNOSIS — Z87891 Personal history of nicotine dependence: Secondary | ICD-10-CM | POA: Insufficient documentation

## 2020-04-24 DIAGNOSIS — K315 Obstruction of duodenum: Secondary | ICD-10-CM

## 2020-04-24 DIAGNOSIS — D62 Acute posthemorrhagic anemia: Secondary | ICD-10-CM | POA: Diagnosis not present

## 2020-04-24 DIAGNOSIS — D649 Anemia, unspecified: Secondary | ICD-10-CM

## 2020-04-24 HISTORY — PX: GIVENS CAPSULE STUDY: SHX5432

## 2020-04-24 HISTORY — PX: ESOPHAGOGASTRODUODENOSCOPY (EGD) WITH PROPOFOL: SHX5813

## 2020-04-24 SURGERY — IMAGING PROCEDURE, GI TRACT, INTRALUMINAL, VIA CAPSULE
Anesthesia: General

## 2020-04-24 MED ORDER — SODIUM CHLORIDE 0.9 % IV SOLN: INTRAVENOUS | Status: DC

## 2020-04-24 MED ORDER — SODIUM CHLORIDE FLUSH 0.9 % IV SOLN
INTRAVENOUS | Status: AC
  Filled 2020-04-24: qty 10

## 2020-04-24 MED ORDER — PROPOFOL 10 MG/ML IV BOLUS
INTRAVENOUS | Status: DC | PRN
  Administered 2020-04-24 (×2): 20 mg via INTRAVENOUS
  Administered 2020-04-24: 40 mg via INTRAVENOUS
  Administered 2020-04-24: 20 mg via INTRAVENOUS
  Administered 2020-04-24: 30 mg via INTRAVENOUS
  Administered 2020-04-24: 70 mg via INTRAVENOUS
  Administered 2020-04-24: 40 mg via INTRAVENOUS

## 2020-04-24 MED ORDER — PROPOFOL 10 MG/ML IV BOLUS
INTRAVENOUS | Status: AC
  Filled 2020-04-24: qty 20

## 2020-04-24 MED ORDER — LIDOCAINE HCL (CARDIAC) PF 100 MG/5ML IV SOSY
PREFILLED_SYRINGE | INTRAVENOUS | Status: DC | PRN
  Administered 2020-04-24: 50 mg via INTRAVENOUS

## 2020-04-24 MED ORDER — GLYCOPYRROLATE 0.2 MG/ML IJ SOLN
INTRAMUSCULAR | Status: DC | PRN
  Administered 2020-04-24: .1 mg via INTRAVENOUS

## 2020-04-24 MED ORDER — EPHEDRINE 5 MG/ML INJ
INTRAVENOUS | Status: AC
  Filled 2020-04-24: qty 10

## 2020-04-24 MED ORDER — EPHEDRINE SULFATE 50 MG/ML IJ SOLN
INTRAMUSCULAR | Status: DC | PRN
  Administered 2020-04-24 (×2): 10 mg via INTRAVENOUS

## 2020-04-24 NOTE — H&P (Signed)
Wyline Mood, MD [address], [address], [place], [place], [address] [address], [address], [place], [place], [address] Phone: [phone]  Fax: [phone]  Primary Care Physician:  Antonieta Loveless, MD   Pre-Procedure History & Physical: HPI:  Caleb Edwards is a 69 y.o. male is here for an endoscopy    Past Medical History:  Diagnosis Date  . Back pain   . Blood transfusion without reported diagnosis 12/2015   bleeding ulcer hospitalization.   . Diverticulitis   . Hypertension   . Ulcer, stomach peptic, chronic     Past Surgical History:  Procedure Laterality Date  . COLONOSCOPY WITH PROPOFOL N/A 08/02/2017   Procedure: COLONOSCOPY WITH PROPOFOL;  Surgeon: Midge Minium, MD;  Location: [facility] ENDOSCOPY;  Service: Endoscopy;  Laterality: N/A;  . COLONOSCOPY WITH PROPOFOL N/A 02/05/2018   Procedure: COLONOSCOPY WITH PROPOFOL;  Surgeon: Wyline Mood, MD;  Location: [facility] ENDOSCOPY;  Service: Gastroenterology;  Laterality: N/A;  . COLONOSCOPY WITH PROPOFOL N/A 08/09/2019   Procedure: COLONOSCOPY WITH PROPOFOL;  Surgeon: Toledo, Mcglasson Nearing, MD;  Location: [facility] ENDOSCOPY;  Service: Gastroenterology;  Laterality: N/A;  . ESOPHAGOGASTRODUODENOSCOPY (EGD) WITH PROPOFOL N/A 01/03/2016   Procedure: ESOPHAGOGASTRODUODENOSCOPY (EGD) WITH PROPOFOL;  Surgeon: Scot Jun, MD;  Location: [facility] ENDOSCOPY;  Service: Endoscopy;  Laterality: N/A;  . ESOPHAGOGASTRODUODENOSCOPY (EGD) WITH PROPOFOL  08/02/2017   Procedure: ESOPHAGOGASTRODUODENOSCOPY (EGD) WITH PROPOFOL;  Surgeon: Midge Minium, MD;  Location: [facility] ENDOSCOPY;  Service: Endoscopy;;  . ESOPHAGOGASTRODUODENOSCOPY (EGD) WITH PROPOFOL N/A 02/05/2018   Procedure: ESOPHAGOGASTRODUODENOSCOPY (EGD) WITH PROPOFOL;  Surgeon: Wyline Mood, MD;  Location: [facility] ENDOSCOPY;  Service: Gastroenterology;  Laterality: N/A;  . ESOPHAGOGASTRODUODENOSCOPY (EGD) WITH PROPOFOL N/A 03/30/2018   Procedure: ESOPHAGOGASTRODUODENOSCOPY (EGD) WITH PROPOFOL WITH  DILATION;  Surgeon: Wyline Mood, MD;  Location: [facility] ENDOSCOPY;  Service: Gastroenterology;  Laterality: N/A;  . ESOPHAGOGASTRODUODENOSCOPY (EGD) WITH PROPOFOL N/A 08/09/2019   Procedure: ESOPHAGOGASTRODUODENOSCOPY (EGD) WITH PROPOFOL;  Surgeon: Toledo, Bowden Nearing, MD;  Location: [facility] ENDOSCOPY;  Service: Gastroenterology;  Laterality: N/A;    Prior to Admission medications   Medication Sig Start Date End Date Taking? Authorizing Provider  amLODipine (NORVASC) 10 MG tablet Take 1 tablet by mouth daily. 12/31/17  Yes [provider]  atorvastatin (LIPITOR) 20 MG tablet Take 1 tablet by mouth at bedtime. 12/31/17  Yes [provider]  chlorthalidone (HYGROTON) 50 MG tablet Take 1 tablet by mouth daily. 12/31/17  Yes [provider]  lisinopril (PRINIVIL,ZESTRIL) 40 MG tablet Take 1 tablet by mouth daily. 12/31/17  Yes [provider]  pantoprazole (PROTONIX) 40 MG tablet Take 1 tablet (40 mg total) by mouth 2 (two) times daily before a meal. 01/04/16  Yes Shaune Pollack, MD  traMADol (ULTRAM) 50 MG tablet Take 2 tablets (100 mg total) by mouth every 6 (six) hours as needed for severe pain. Each refill must last 30 days. 03/06/20 09/02/20 Yes Delano Metz, MD  ferrous sulfate 325 (65 FE) MG tablet Take 325 mg by mouth daily with breakfast.    [provider]  polyethylene glycol (MIRALAX / GLYCOLAX) packet Take 17 g by mouth daily. 02/06/18   Auburn Bilberry, MD  sildenafil (VIAGRA) 100 MG tablet Take 100 mg by mouth daily as needed for erectile dysfunction.    [provider]    Allergies as of 03/25/2020  . (No Known Allergies)    Family History  Problem Relation Age of Onset  . Diabetes Mother   . Heart disease Father   .  Prostate cancer Brother   . Bladder Cancer Neg Hx   . Kidney cancer Neg Hx     Social History   Socioeconomic History  . Marital status: Married    Spouse name: Not on file  . Number of children: Not on file  . Years  of education: Not on file  . Highest education level: Not on file  Occupational History  . Not on file  Tobacco Use  . Smoking status: Former Games developer  . Smokeless tobacco: Never Used  Vaping Use  . Vaping Use: Never used  Substance and Sexual Activity  . Alcohol use: Yes    Alcohol/week: 1.0 standard drink    Types: 1 Standard drinks or equivalent per week  . Drug use: No  . Sexual activity: Yes  Other Topics Concern  . Not on file  Social History Narrative   apartment [place]; lives in [place] with wife; quit smoking 25 years ago; beer- couple every day.    Social Determinants of Health   Financial Resource Strain:   . Difficulty of Paying Living Expenses:   Food Insecurity:   . Worried About Programme researcher, broadcasting/film/video in the Last Year:   . Barista in the Last Year:   Transportation Needs:   . Freight forwarder (Medical):   Marland Kitchen Lack of Transportation (Non-Medical):   Physical Activity:   . Days of Exercise per Week:   . Minutes of Exercise per Session:   Stress:   . Feeling of Stress :   Social Connections:   . Frequency of Communication with Friends and Family:   . Frequency of Social Gatherings with Friends and Family:   . Attends Religious Services:   . Active Member of Clubs or Organizations:   . Attends Banker Meetings:   Marland Kitchen Marital Status:   Intimate Partner Violence:   . Fear of Current or Ex-Partner:   . Emotionally Abused:   Marland Kitchen Physically Abused:   . Sexually Abused:     Review of Systems: See HPI, otherwise negative ROS  Physical Exam: BP 140/75   Pulse 70   Temp 97.8 F (36.6 C) (Temporal)   Resp 16   Ht 6' (1.829 m)   Wt 79.4 kg   SpO2 100%   BMI 23.73 kg/m  General:   Alert,  pleasant and cooperative in NAD Head:  Normocephalic and atraumatic. Neck:  Supple; no masses or thyromegaly. Lungs:  Clear throughout to auscultation, normal respiratory effort.    Heart:  +S1, +S2, Regular rate and rhythm, No edema. Abdomen:   Soft, nontender and nondistended. Normal bowel sounds, without guarding, and without rebound.   Neurologic:  Alert and  oriented x4;  grossly normal neurologically.  Impression/Plan: Caleb Edwards is here for an endoscopy  to be performed for  Placement of capsule - pill cam endoscopically    Risks, benefits, limitations, and alternatives regarding endoscopy have been reviewed with the patient.  Questions have been answered.  All parties agreeable.   Wyline Mood, MD  04/24/2020, 8:12 AM

## 2020-04-24 NOTE — Op Note (Signed)
[facility] Patient Name: Caleb Edwards Procedure Date: 04/24/2020 7:41 AM MRN: [id number] Account #: [id number] Date of Birth: 1951/07/31 Admit Type: Outpatient Age: 69 Room: [facility] ENDO ROOM 3 Gender: Male Note Status: Finalized Procedure:             Upper GI endoscopy Indications:           Acute post hemorrhagic anemia Providers:             Wyline Mood MD, MD Medicines:             Monitored Anesthesia Care Complications:         No immediate complications. Procedure:             Pre-Anesthesia Assessment:                        - Prior to the procedure, a History and Physical was                         performed, and patient medications, allergies and                         sensitivities were reviewed. The patient's tolerance                         of previous anesthesia was reviewed.                        - The risks and benefits of the procedure and the                         sedation options and risks were discussed with the                         patient. All questions were answered and informed                         consent was obtained.                        - ASA Grade Assessment: II - A patient with mild                         systemic disease.                        After obtaining informed consent, the endoscope was                         passed under direct vision. Throughout the procedure,                         the patient's blood pressure, pulse, and oxygen                         saturations were monitored continuously. The Endoscope                         was introduced through the mouth, and advanced to the  third part of duodenum. The upper GI endoscopy was                         technically difficult and complex due to the patient's                         medical instability. The patient tolerated the                         procedure well. Findings:      The esophagus was normal.       An acquired benign-appearing, intrinsic moderate stenosis was found in       the second portion of the duodenum and was traversed. A TTS dilator was       passed through the scope. Dilation with a 15 mm pyloric balloon dilator       was performed. The dilation site was examined and showed moderate       mucosal disruption.      A benign-appearing, intrinsic mild stenosis was found at the pylorus.       This was traversed. A TTS dilator was passed through the scope. Dilation       with a 15 mm pyloric balloon dilator was performed. The dilation site       was examined and showed no change. Attemmpted to pass the pill cam       endoscopically but could not get it past the pyrous ? spasm , every       attempt tried to push it through the pylorus he developed bradycardia       and hence had to stop the procedure      The cardia and gastric fundus were normal on retroflexion. Impression:            - Normal esophagus.                        - Acquired duodenal stenosis. Dilated.                        - Gastric stenosis was found at the pylorus. Dilated.                        - No specimens collected. Recommendation:        - Discharge patient to home (with escort).                        - Resume previous diet.                        - Continue present medications.                        - Return to my office in 4 weeks. Procedure Code(s):     --- Professional ---                        [phone], Esophagogastroduodenoscopy, flexible,                         transoral; with dilation of gastric/duodenal  stricture(s) (eg, balloon, bougie) Diagnosis Code(s):     --- Professional ---                        K31.5, Obstruction of duodenum                        K31.1, Adult hypertrophic pyloric stenosis                        D62, Acute posthemorrhagic anemia CPT copyright 2019 American Medical Association. All rights reserved. The codes documented in this report are  preliminary and upon coder review may  be revised to meet current compliance requirements. Wyline Mood, MD Wyline Mood MD, MD 04/24/2020 9:27:27 AM This report has been signed electronically. Number of Addenda: 0 Note Initiated On: 04/24/2020 7:41 AM Estimated Blood Loss:  Estimated blood loss: none.      [facility]

## 2020-04-24 NOTE — Anesthesia Postprocedure Evaluation (Signed)
Anesthesia Post Note  Patient: Caleb Edwards  Procedure(s) Performed: GIVENS CAPSULE STUDY (N/A ) ESOPHAGOGASTRODUODENOSCOPY (EGD) WITH PROPOFOL (N/A )  Patient location during evaluation: Endoscopy Anesthesia Type: General Level of consciousness: awake and alert Pain management: pain level controlled Vital Signs Assessment: post-procedure vital signs reviewed and stable Respiratory status: spontaneous breathing, nonlabored ventilation, respiratory function stable and patient connected to nasal cannula oxygen Cardiovascular status: blood pressure returned to baseline and stable Postop Assessment: no apparent nausea or vomiting Anesthetic complications: no   No complications documented.   Last Vitals:  Vitals:   04/24/20 0900 04/24/20 0910  BP: 123/73 135/75  Pulse: 77 (!) 51  Resp: 20 15  Temp:    SpO2: 100% 100%    Last Pain:  Vitals:   04/24/20 0850  TempSrc: Temporal                 Corinda Gubler

## 2020-04-24 NOTE — Anesthesia Preprocedure Evaluation (Signed)
Anesthesia Evaluation  Patient identified by MRN, date of birth, ID band Patient awake    Reviewed: Allergy & Precautions, NPO status , Patient's Chart, lab work & pertinent test results, reviewed documented beta blocker date and time   History of Anesthesia Complications Negative for: history of anesthetic complications  Airway Mallampati: II  TM Distance: >3 FB Neck ROM: Full    Dental  (+) Upper Dentures, Lower Dentures   Pulmonary neg pulmonary ROS, former smoker,    Pulmonary exam normal breath sounds clear to auscultation       Cardiovascular Exercise Tolerance: Good hypertension, Pt. on medications (-) angina(-) CAD, (-) Past MI, (-) Cardiac Stents and (-) CABG (-) dysrhythmias (-) Valvular Problems/Murmurs Rhythm:Regular Rate:Normal - Systolic murmurs    Neuro/Psych neg Seizures  Neuromuscular disease negative psych ROS   GI/Hepatic Neg liver ROS, PUD, GERD  ,  Endo/Other  negative endocrine ROS  Renal/GU CRFRenal disease     Musculoskeletal  (+) Arthritis ,   Abdominal   Peds  Hematology  (+) Blood dyscrasia, anemia ,   Anesthesia Other Findings Past Medical History: No date: Back pain 12/2015: Blood transfusion without reported diagnosis     Comment:  bleeding ulcer hospitalization.  No date: Diverticulitis No date: Hypertension No date: Ulcer, stomach peptic, chronic   Reproductive/Obstetrics                             Anesthesia Physical  Anesthesia Plan  ASA: II  Anesthesia Plan: General   Post-op Pain Management:    Induction: Intravenous  PONV Risk Score and Plan: 2 and Propofol infusion and TIVA  Airway Management Planned: Nasal Cannula and Natural Airway  Additional Equipment: None  Intra-op Plan:   Post-operative Plan:   Informed Consent: I have reviewed the patients History and Physical, chart, labs and discussed the procedure including the risks,  benefits and alternatives for the proposed anesthesia with the patient or authorized representative who has indicated his/her understanding and acceptance.     Dental advisory given  Plan Discussed with: CRNA  Anesthesia Plan Comments: (Discussed risks of anesthesia with patient, including possibility of difficulty with spontaneous ventilation under anesthesia necessitating airway intervention, PONV, and rare risks such as cardiac or respiratory or neurological events. Patient understands.)        Anesthesia Quick Evaluation

## 2020-04-24 NOTE — Brief Op Note (Signed)
Pyloric stricture dilated with 10-12 and 12-15 wireguided balloon. MD unable to pass pyloris with capsule (guided with insertion device)

## 2020-04-24 NOTE — Transfer of Care (Signed)
Immediate Anesthesia Transfer of Care Note  Patient: Caleb Edwards  Procedure(s) Performed: GIVENS CAPSULE STUDY (N/A ) ESOPHAGOGASTRODUODENOSCOPY (EGD) WITH PROPOFOL (N/A )  Patient Location: PACU and Endoscopy Unit  Anesthesia Type:General  Level of Consciousness: awake, alert  and oriented  Airway & Oxygen Therapy: Patient Spontanous Breathing  Post-op Assessment: Report given to RN and Post -op Vital signs reviewed and stable  Post vital signs: Reviewed and stable  Last Vitals:  Vitals Value Taken Time  BP 118/78 04/24/20 0856  Temp    Pulse 58 04/24/20 0856  Resp 17 04/24/20 0856  SpO2 100 % 04/24/20 0856  Vitals shown include unvalidated device data.  Last Pain:  Vitals:   04/24/20 0708  TempSrc: Temporal         Complications: No complications documented.

## 2020-04-24 NOTE — Anesthesia Procedure Notes (Signed)
Date/Time: 04/24/2020 8:15 AM Performed by: Elmarie Mainland, CRNA Pre-anesthesia Checklist: Patient identified, Emergency Drugs available, Suction available and Patient being monitored Patient Re-evaluated:Patient Re-evaluated prior to induction Oxygen Delivery Method: Nasal cannula

## 2020-04-25 ENCOUNTER — Encounter: Payer: Self-pay | Admitting: Gastroenterology

## 2020-04-30 ENCOUNTER — Ambulatory Visit: Payer: Medicare HMO | Admitting: Surgery

## 2020-06-16 ENCOUNTER — Encounter: Payer: Self-pay | Admitting: *Deleted

## 2020-06-17 ENCOUNTER — Encounter: Payer: Self-pay | Admitting: *Deleted

## 2020-09-01 ENCOUNTER — Encounter: Payer: Medicare HMO | Admitting: Pain Medicine

## 2020-09-21 NOTE — Progress Notes (Signed)
PROVIDER NOTE: Information contained herein reflects review and annotations entered in association with encounter. Interpretation of such information and data should be left to medically-trained personnel. Information provided to patient can be located elsewhere in the medical record under "Patient Instructions". Document created using STT-dictation technology, any transcriptional errors that may result from process are unintentional.    Patient: Caleb Edwards  Service Category: E/M  Provider: Francisco A Naveira, MD  DOB: 11/02/1950  DOS: 09/22/2020  Specialty: Interventional Pain Management  MRN: [id number]  Setting: Ambulatory outpatient  PCP: Duffy, Molly M, MD  Type: Established Patient    Referring Provider: [facility]  Location: Office  Delivery: Face-to-face     HPI  Mr. Taft J Tait, a 69 y.o. year old male, is here today because of his Cervical spondylosis [M47.812]. Mr. Shedrick's primary complain today is Back Pain (low) and Neck Pain Last encounter: My last encounter with him was on Visit date not found. Pertinent problems: Mr. 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 Pain Assessment: Severity of Chronic pain is reported as a 8 /10. Location: Back Lower/radiates down the back of both legs to calf. Onset: More than a month ago. Quality: Throbbing. Timing:  Intermittent. Modifying factor(s): rest, medications. Vitals:  height is 6' (1.829 m) and weight is 180 lb (81.6 kg). His temperature is 97.3 F (36.3 C) (abnormal). His blood pressure is 142/84 (abnormal) and his pulse is 81. His respiration is 18 and oxygen saturation is 100%.   Reason for encounter: medication management.  The patient indicates doing well with the current medication regimen. No adverse reactions or side effects reported to the medications.  The patient indicates that he has been having more problems with the lower back and his neck.  In the area of the neck the pain goes to the shoulder and down the left arm into the thumb, index finger, and middle finger and what seems to be a C6/C7 dermatomal distribution.  The patient does have an MRI that we did in 2014, but nothing since.  He will like to proceed with an injection see if we can get this better.  After we get done with the cervical region, then will turn our attention to his lower back again.  Last time we provided him with interventional therapies for the lower back was approximately 6 months ago.  RTCB: 12/21/2020  Pharmacotherapy Assessment   Analgesic: Tramadol 50 mg, 2 tabs PO q 6 hrs (400 mg/day of tramadol) (LAST UDS DONE 02/21/2020. Scanned wrong into system) MME/day:40 mg/day.   Monitoring: [place] PMP: PDMP reviewed during this encounter.       Pharmacotherapy: No side-effects or adverse reactions reported. Compliance: No problems identified. Effectiveness: Clinically acceptable.  Tice, Kori M, RN  09/22/2020  2:26 PM  Signed Nursing Pain Medication Assessment:  Safety precautions to be maintained throughout the outpatient stay will include: orient to surroundings, keep bed in low position, maintain call bell within reach at all times, provide assistance with transfer out of bed and   ambulation.  Medication Inspection Compliance: Mr. Antenucci did not comply with our request to bring his pills to be counted. He was  reminded that bringing the medication bottles, even when empty, is a requirement.  Medication: None brought in. Pill/Patch Count: None available to be counted. Bottle Appearance: No container available. Did not bring bottle(s) to appointment. Filled Date: N/A Last Medication intake:  Today  Reminded to bring to all appointments    UDS:  Summary  Date Value Ref Range Status  03/24/2017 FINAL  Final    Comment:    ==================================================================== TOXASSURE SELECT 13 (MW) ==================================================================== Test                             Result       Flag       Units Drug Present   Tramadol                       PRESENT   O-Desmethyltramadol            PRESENT   N-Desmethyltramadol            PRESENT    Source of tramadol is a prescription medication.    O-desmethyltramadol and N-desmethyltramadol are expected    metabolites of tramadol. ==================================================================== Test                      Result    Flag   Units      Ref Range   Creatinine              125              mg/dL      >=20 ==================================================================== Declared Medications:  Medication list was not provided. ==================================================================== For clinical consultation, please call [phone]. ====================================================================      ROS  Constitutional: Denies any fever or chills Gastrointestinal: No reported hemesis, hematochezia, vomiting, or acute GI distress Musculoskeletal: Denies any acute onset joint swelling, redness, loss of ROM, or weakness Neurological: No reported episodes of acute onset apraxia, aphasia, dysarthria, agnosia, amnesia, paralysis, loss of coordination, or loss of consciousness  Medication Review  amLODipine, atorvastatin, chlorthalidone, ferrous sulfate, lisinopril,  pantoprazole, polyethylene glycol, sildenafil, traMADol, and traZODone  History Review  Allergy: Mr. Pullara has No Known Allergies. Drug: Mr. Franzel  reports no history of drug use. Alcohol:  reports current alcohol use of about 1.0 standard drink of alcohol per week. Tobacco:  reports that he has quit smoking. He has never used smokeless tobacco. Social: Mr. Hinojos  reports that he has quit smoking. He has never used smokeless tobacco. He reports current alcohol use of about 1.0 standard drink of alcohol per week. He reports that he does not use drugs. Medical:  has a past medical history of Back pain, Blood transfusion without reported diagnosis (12/2015), Diverticulitis, Hypertension, and Ulcer, stomach peptic, chronic. Surgical: Mr. Streater  has a past surgical history that includes Esophagogastroduodenoscopy (egd) with propofol (N/A, 01/03/2016); Colonoscopy with propofol (N/A, 08/02/2017); Esophagogastroduodenoscopy (egd) with propofol (08/02/2017); Esophagogastroduodenoscopy (egd) with propofol (N/A, 02/05/2018); Colonoscopy with propofol (N/A, 02/05/2018); Esophagogastroduodenoscopy (egd) with propofol (N/A, 03/30/2018); Esophagogastroduodenoscopy (egd) with propofol (N/A, 08/09/2019); Colonoscopy with propofol (N/A, 08/09/2019); Givens capsule study (N/A, 04/24/2020); and Esophagogastroduodenoscopy (egd) with propofol (N/A, 04/24/2020). Family: family history includes Diabetes in his mother; Heart disease in his father; Prostate cancer in his brother.  Laboratory Chemistry Profile   Renal Lab  Results  Component Value Date   BUN 18 02/21/2020   CREATININE 1.24 02/21/2020   GFRAA >60 02/21/2020   GFRNONAA 60 (L) 02/21/2020     Hepatic Lab Results  Component Value Date   AST 23 08/09/2019   ALT 15 08/09/2019   ALBUMIN 3.8 08/09/2019   ALKPHOS 53 08/09/2019   LIPASE 17 08/08/2019     Electrolytes Lab Results  Component Value Date   NA 138 08/09/2019   K 3.9 08/09/2019   CL 106  08/09/2019   CALCIUM 8.7 (L) 08/09/2019   MG 1.9 01/04/2016     Bone Lab Results  Component Value Date   VD25OH 24.0 (L) 12/26/2015     Inflammation (CRP: Acute Phase) (ESR: Chronic Phase) Lab Results  Component Value Date   CRP 0.6 12/26/2015   ESRSEDRATE 3 12/26/2015       Note: Above Lab results reviewed.  Recent Imaging Review  CT ENTERO ABD/PELVIS W CONTAST CLINICAL DATA:  Lower GI bleeding.  Recurrent diverticulitis.  EXAM: CT ABDOMEN AND PELVIS WITH CONTRAST (ENTEROGRAPHY)  TECHNIQUE: Multidetector CT of the abdomen and pelvis during bolus administration of intravenous contrast. Negative oral contrast was given.  CONTRAST:  100mL OMNIPAQUE IOHEXOL 300 MG/ML  SOLN  COMPARISON:  01/23/2018  FINDINGS: Lower Chest: No acute findings.  Hepatobiliary: No hepatic masses identified. Gallbladder is unremarkable. No evidence of biliary ductal dilatation.  Pancreas:  No mass or inflammatory changes.  Spleen: Within normal limits in size and appearance.  Adrenals/Urinary Tract: No masses identified. A few tiny renal cysts are again seen bilaterally. Small less than 1 cm calculi are again seen in both kidneys. No evidence of ureteral calculi or hydronephrosis.  Stomach/Bowel: No evidence of bowel wall thickening, abnormal contrast enhancement, or dilatation. No evidence of mesenteric inflammatory changes, enteric fistula, or abnormal fluid collections. The terminal ileum is normal in appearance. Severe diffuse colonic diverticulosis is again seen, however there is no evidence of diverticulitis.  Vascular/Lymphatic: No pathologically enlarged lymph nodes. No abdominal aortic aneurysm. Aortic atherosclerosis noted.  Reproductive:  Stable mildly enlarged prostate.  Other:  None.  Musculoskeletal:  No suspicious bone lesions identified.  IMPRESSION: 1. No radiographic evidence of inflammatory bowel disease or other acute findings. 2. Severe colonic  diverticulosis, without radiographic evidence of diverticulitis. 3. Bilateral nephrolithiasis. No evidence of ureteral calculi or hydronephrosis. 4. Stable mildly enlarged prostate.  Aortic Atherosclerosis (ICD10-I70.0).  Electronically Signed   By: John A Stahl M.D.   On: 03/19/2020 14:16 Note: Reviewed        Physical Exam  General appearance: Well nourished, well developed, and well hydrated. In no apparent acute distress Mental status: Alert, oriented x 3 (person, place, & time)       Respiratory: No evidence of acute respiratory distress Eyes: PERLA Vitals: BP (!) 142/84   Pulse 81   Temp (!) 97.3 F (36.3 C)   Resp 18   Ht 6' (1.829 m)   Wt 180 lb (81.6 kg)   SpO2 100%   BMI 24.41 kg/m  BMI: Estimated body mass index is 24.41 kg/m as calculated from the following:   Height as of this encounter: 6' (1.829 m).   Weight as of this encounter: 180 lb (81.6 kg). Ideal: Ideal body weight: 77.6 kg (171 lb 1.2 oz) Adjusted ideal body weight: 79.2 kg (174 lb 10.3 oz)  Assessment   Status Diagnosis  Controlled Controlled Controlled 1. Cervical spondylosis   2. Chronic pain syndrome   3. Chronic neck pain     4. Cervical radiculopathy (Left)      Updated Problems: Problem  Cervical radiculopathy (Left)    Plan of Care  Problem-specific:  No problem-specific Assessment & Plan notes found for this encounter.  Mr. ADELARD SANON has a current medication list which includes the following long-term medication(s): atorvastatin, chlorthalidone, lisinopril, pantoprazole, tramadol, and trazodone.  Pharmacotherapy (Medications Ordered): Meds ordered this encounter  Medications  . traMADol (ULTRAM) 50 MG tablet    Sig: Take 2 tablets (100 mg total) by mouth every 6 (six) hours as needed for severe pain. Each refill must last 30 days.    Dispense:  240 tablet    Refill:  2    Chronic Pain: STOP Act (Not applicable) Fill 1 day early if closed on refill date. Avoid  benzodiazepines within 8 hours of opioids   Orders:  Orders Placed This Encounter  Procedures  . Cervical Epidural Injection    Level(s): C7-T1 Laterality: Left-sided Purpose: Diagnostic/Therapeutic Indication(s): Radiculitis and cervicalgia associater with cervical degenerative disc disease.    Standing Status:   Future    Standing Expiration Date:   10/23/2020    Scheduling Instructions:     Procedure: Cervical Epidural Steroid Injection/Block     Sedation: Patient's choice.     Timeframe: As soon as schedule allows    Order Specific Question:   Where will this procedure be performed?    Answer:   [facility]    Comments:   by Dr. Dossie Arbour  . DG Cervical Spine With Flex & Extend    Patient presents with axial pain with possible radicular component.  Please evaluate for any evidence of cervical spine instability. Describe the presence of any spondylolisthesis (Antero- or retrolisthesis). If present, provide displacement "Grade" and measurement in cm. Please describe presence and specific location (Level & Laterality) of any signs of  osteoarthritis, zygapophyseal (Facet) joints DJD (including decreased joint space and/or osteophytosis), DDD, Foraminal narrowing, as well as any sclerosis and/or cyst formation. Please comment on ROM. In addition to any acute findings, please report on:  1. Facet (Zygapophyseal) joint DJD (Hypertrophy, space narrowing, subchondral sclerosis, and/or osteophyte formation) 2. DDD and/or IVDD (Loss of disc height, desiccation or "Black disc disease") 3. Pars defects 4. Spondylolisthesis, spondylosis, and/or spondyloarthropathies (include Degree/Grade of displacement in mm) 5. Vertebral body Fractures, including age (old, new/acute) 57. Modic Type Changes 7. Demineralization 8. Bone pathology 9. Central, Lateral Recess, and/or Foraminal Stenosis (include AP diameter of stenosis in mm) 10. Surgical changes (hardware type, status, and presence of  fibrosis) NOTE: Please specify level(s) and laterality. If applicable: Please indicate ROM and/or evidence of instability (>35m displacement between flexion and extension views)    Standing Status:   Future    Standing Expiration Date:   10/23/2020    Scheduling Instructions:     Imaging must be done as soon as possible. Inform patient that order will expire within 30 days and I will not renew it.    Order Specific Question:   Reason for Exam (SYMPTOM  OR DIAGNOSIS REQUIRED)    Answer:   Cervicalgia    Order Specific Question:   Preferred imaging location?    Answer:   [facility]    Order Specific Question:   Call Results- Best Contact Number?    Answer:   [phone]([facility]    Order Specific Question:   Radiology Contrast Protocol - do NOT remove file path    Answer:   [email or web address]    Order  Specific Question:   Release to patient    Answer:   Immediate   Follow-up plan:   Return for Procedure (w/ sedation): (L) CESI #1 & UDS update.      Interventional treatment options:  Under consideration:   Diagnostic right IA hip joint injection  Diagnostic bilateral lumbar facet block  Possible bilateral lumbar facet RFA    Therapeutic/palliative (PRN):   Palliative left L3-4 LESI #3  Palliative left L4 TFESI #2  Palliative right L3 TFESI #2  Palliative right L4 TFESI #3        Recent Visits No visits were found meeting these conditions. Showing recent visits within past 90 days and meeting all other requirements Today's Visits Date Type Provider Dept  09/22/20 Office Visit Milinda Pointer, MD [facility]  Showing today's visits and meeting all other requirements Future Appointments No visits were found meeting these conditions. Showing future appointments within next 90 days and meeting all other requirements  I discussed the assessment and treatment plan with the patient. The patient was provided an  opportunity to ask questions and all were answered. The patient agreed with the plan and demonstrated an understanding of the instructions.  Patient advised to call back or seek an in-person evaluation if the symptoms or condition worsens.  Duration of encounter: 30 minutes.  Note by: Gaspar Cola, MD Date: 09/22/2020; Time: 3:11 PM

## 2020-09-22 ENCOUNTER — Ambulatory Visit: Payer: Medicare HMO | Attending: Pain Medicine | Admitting: Pain Medicine

## 2020-09-22 ENCOUNTER — Other Ambulatory Visit: Payer: Self-pay

## 2020-09-22 ENCOUNTER — Encounter: Payer: Self-pay | Admitting: Pain Medicine

## 2020-09-22 VITALS — BP 142/84 | HR 81 | Temp 97.3°F | Resp 18 | Ht 72.0 in | Wt 180.0 lb

## 2020-09-22 DIAGNOSIS — M5412 Radiculopathy, cervical region: Secondary | ICD-10-CM | POA: Diagnosis not present

## 2020-09-22 DIAGNOSIS — M47812 Spondylosis without myelopathy or radiculopathy, cervical region: Secondary | ICD-10-CM | POA: Insufficient documentation

## 2020-09-22 DIAGNOSIS — G8929 Other chronic pain: Secondary | ICD-10-CM | POA: Diagnosis present

## 2020-09-22 DIAGNOSIS — G894 Chronic pain syndrome: Secondary | ICD-10-CM | POA: Diagnosis not present

## 2020-09-22 DIAGNOSIS — M542 Cervicalgia: Secondary | ICD-10-CM

## 2020-09-22 MED ORDER — TRAMADOL HCL 50 MG PO TABS: 100.0000 mg | ORAL_TABLET | Freq: Four times a day (QID) | ORAL | 2 refills | Status: DC | PRN

## 2020-09-22 NOTE — Patient Instructions (Addendum)
____________________________________________________________________________________________  Preparing for Procedure with Sedation  Procedure appointments are limited to planned procedures: . No Prescription Refills. . No disability issues will be discussed. . No medication changes will be discussed.  Instructions: . Oral Intake: Do not eat or drink anything for at least 8 hours prior to your procedure. (Exception: Blood Pressure Medication. See below.) . Transportation: Unless otherwise stated by your physician, you may drive yourself after the procedure. . Blood Pressure Medicine: Do not forget to take your blood pressure medicine with a sip of water the morning of the procedure. If your Diastolic (lower reading)is above 100 mmHg, elective cases will be cancelled/rescheduled. . Blood thinners: These will need to be stopped for procedures. Notify our staff if you are taking any blood thinners. Depending on which one you take, there will be specific instructions on how and when to stop it. . Diabetics on insulin: Notify the staff so that you can be scheduled 1st case in the morning. If your diabetes requires high dose insulin, take only  of your normal insulin dose the morning of the procedure and notify the staff that you have done so. . Preventing infections: Shower with an antibacterial soap the morning of your procedure. . Build-up your immune system: Take 1000 mg of Vitamin C with every meal (3 times a day) the day prior to your procedure. . Antibiotics: Inform the staff if you have a condition or reason that requires you to take antibiotics before dental procedures. . Pregnancy: If you are pregnant, call and cancel the procedure. . Sickness: If you have a cold, fever, or any active infections, call and cancel the procedure. . Arrival: You must be in the facility at least 30 minutes prior to your scheduled procedure. . Children: Do not bring children with you. . Dress appropriately:  Bring dark clothing that you would not mind if they get stained. . Valuables: Do not bring any jewelry or valuables.  Reasons to call and reschedule or cancel your procedure: (Following these recommendations will minimize the risk of a serious complication.) . Surgeries: Avoid having procedures within 2 weeks of any surgery. (Avoid for 2 weeks before or after any surgery). . Flu Shots: Avoid having procedures within 2 weeks of a flu shots or . (Avoid for 2 weeks before or after immunizations). . Barium: Avoid having a procedure within 7-10 days after having had a radiological study involving the use of radiological contrast. (Myelograms, Barium swallow or enema study). . Heart attacks: Avoid any elective procedures or surgeries for the initial 6 months after a "Myocardial Infarction" (Heart Attack). . Blood thinners: It is imperative that you stop these medications before procedures. Let us know if you if you take any blood thinner.  . Infection: Avoid procedures during or within two weeks of an infection (including chest colds or gastrointestinal problems). Symptoms associated with infections include: Localized redness, fever, chills, night sweats or profuse sweating, burning sensation when voiding, cough, congestion, stuffiness, runny nose, sore throat, diarrhea, nausea, vomiting, cold or Flu symptoms, recent or current infections. It is specially important if the infection is over the area that we intend to treat. . Heart and lung problems: Symptoms that may suggest an active cardiopulmonary problem include: cough, chest pain, breathing difficulties or shortness of breath, dizziness, ankle swelling, uncontrolled high or unusually low blood pressure, and/or palpitations. If you are experiencing any of these symptoms, cancel your procedure and contact your primary care physician for an evaluation.  Remember:  Regular Business hours are:    Monday to Thursday 8:00 AM to 4:00 PM  Provider's  Schedule: Rahul Malinak, MD:  Procedure days: Tuesday and Thursday 7:30 AM to 4:00 PM  Bilal Lateef, MD:  Procedure days: Monday and Wednesday 7:30 AM to 4:00 PM ____________________________________________________________________________________________   Preparing for your procedure (without sedation) Instructions: . Oral Intake: Do not eat or drink anything for at least 3 hours prior to your procedure. . Transportation: Unless otherwise stated by your physician, you may drive yourself after the procedure. . Blood Pressure Medicine: Take your blood pressure medicine with a sip of water the morning of the procedure. . Insulin: Take only  of your normal insulin dose. . Preventing infections: Shower with an antibacterial soap the morning of your procedure. . Build-up your immune system: Take 1000 mg of Vitamin C with every meal (3 times a day) the day prior to your procedure. . Pregnancy: If you are pregnant, call and cancel the procedure. . Sickness: If you have a cold, fever, or any active infections, call and cancel the procedure. . Arrival: You must be in the facility at least 30 minutes prior to your scheduled procedure. . Children: Do not bring any children with you. . Dress appropriately: Bring dark clothing that you would not mind if they get stained. . Valuables: Do not bring any jewelry or valuables. Procedure appointments are reserved for interventional treatments only. . No Prescription Refills. . No medication changes will be discussed during procedure appointments. . No disability issues will be discussed. .  

## 2020-09-22 NOTE — Progress Notes (Signed)
Nursing Pain Medication Assessment:  Safety precautions to be maintained throughout the outpatient stay will include: orient to surroundings, keep bed in low position, maintain call bell within reach at all times, provide assistance with transfer out of bed and ambulation.  Medication Inspection Compliance: Caleb Edwards did not comply with our request to bring his pills to be counted. He was reminded that bringing the medication bottles, even when empty, is a requirement.  Medication: None brought in. Pill/Patch Count: None available to be counted. Bottle Appearance: No container available. Did not bring bottle(s) to appointment. Filled Date: N/A Last Medication intake:  Today  Reminded to bring to all appointments

## 2020-10-21 ENCOUNTER — Ambulatory Visit
Admission: RE | Admit: 2020-10-21 | Discharge: 2020-10-21 | Disposition: A | Discharge status: Home / Self Care | Payer: Medicare HMO | Source: Ambulatory Visit | Attending: Pain Medicine | Admitting: Pain Medicine

## 2020-10-21 ENCOUNTER — Ambulatory Visit
Admission: RE | Admit: 2020-10-21 | Discharge: 2020-10-21 | Disposition: A | Discharge status: Home / Self Care | Payer: Medicare HMO | Attending: Pain Medicine | Admitting: Pain Medicine

## 2020-10-21 ENCOUNTER — Other Ambulatory Visit: Payer: Self-pay

## 2020-10-21 DIAGNOSIS — G8929 Other chronic pain: Secondary | ICD-10-CM | POA: Insufficient documentation

## 2020-10-21 DIAGNOSIS — M542 Cervicalgia: Secondary | ICD-10-CM

## 2020-10-21 DIAGNOSIS — M47812 Spondylosis without myelopathy or radiculopathy, cervical region: Secondary | ICD-10-CM | POA: Diagnosis not present

## 2020-10-28 ENCOUNTER — Other Ambulatory Visit: Payer: Self-pay

## 2020-10-28 ENCOUNTER — Encounter: Payer: Self-pay | Admitting: Pain Medicine

## 2020-10-28 ENCOUNTER — Ambulatory Visit (HOSPITAL_BASED_OUTPATIENT_CLINIC_OR_DEPARTMENT_OTHER): Payer: Medicare HMO | Admitting: Pain Medicine

## 2020-10-28 ENCOUNTER — Ambulatory Visit
Admission: RE | Admit: 2020-10-28 | Discharge: 2020-10-28 | Disposition: A | Discharge status: Home / Self Care | Payer: Medicare HMO | Source: Ambulatory Visit | Attending: Pain Medicine | Admitting: Pain Medicine

## 2020-10-28 VITALS — BP 146/79 | HR 87 | Temp 97.1°F | Resp 13 | Ht 72.0 in | Wt 180.0 lb

## 2020-10-28 DIAGNOSIS — M4312 Spondylolisthesis, cervical region: Secondary | ICD-10-CM | POA: Insufficient documentation

## 2020-10-28 DIAGNOSIS — G8929 Other chronic pain: Secondary | ICD-10-CM | POA: Diagnosis present

## 2020-10-28 DIAGNOSIS — R937 Abnormal findings on diagnostic imaging of other parts of musculoskeletal system: Secondary | ICD-10-CM | POA: Diagnosis present

## 2020-10-28 DIAGNOSIS — M5030 Other cervical disc degeneration, unspecified cervical region: Secondary | ICD-10-CM | POA: Diagnosis present

## 2020-10-28 DIAGNOSIS — M542 Cervicalgia: Secondary | ICD-10-CM | POA: Insufficient documentation

## 2020-10-28 DIAGNOSIS — M4802 Spinal stenosis, cervical region: Secondary | ICD-10-CM | POA: Diagnosis present

## 2020-10-28 DIAGNOSIS — M532X2 Spinal instabilities, cervical region: Secondary | ICD-10-CM | POA: Insufficient documentation

## 2020-10-28 DIAGNOSIS — M47812 Spondylosis without myelopathy or radiculopathy, cervical region: Secondary | ICD-10-CM | POA: Diagnosis present

## 2020-10-28 DIAGNOSIS — M5412 Radiculopathy, cervical region: Secondary | ICD-10-CM

## 2020-10-28 MED ORDER — SODIUM CHLORIDE 0.9% FLUSH
1.0000 mL | Freq: Once | INTRAVENOUS | Status: AC
  Administered 2020-10-28: 1 mL

## 2020-10-28 MED ORDER — LIDOCAINE HCL (PF) 2 % IJ SOLN
INTRAMUSCULAR | Status: AC
  Filled 2020-10-28: qty 10

## 2020-10-28 MED ORDER — FENTANYL CITRATE (PF) 100 MCG/2ML IJ SOLN
INTRAMUSCULAR | Status: AC
  Filled 2020-10-28: qty 2

## 2020-10-28 MED ORDER — ROPIVACAINE HCL 2 MG/ML IJ SOLN
INTRAMUSCULAR | Status: AC
  Filled 2020-10-28: qty 10

## 2020-10-28 MED ORDER — IOHEXOL 180 MG/ML  SOLN
10.0000 mL | Freq: Once | INTRAMUSCULAR | Status: AC
  Administered 2020-10-28: 10 mL via EPIDURAL

## 2020-10-28 MED ORDER — SODIUM CHLORIDE (PF) 0.9 % IJ SOLN
INTRAMUSCULAR | Status: AC
  Filled 2020-10-28: qty 10

## 2020-10-28 MED ORDER — ROPIVACAINE HCL 2 MG/ML IJ SOLN
1.0000 mL | Freq: Once | INTRAMUSCULAR | Status: AC
  Administered 2020-10-28: 1 mL via EPIDURAL

## 2020-10-28 MED ORDER — MIDAZOLAM HCL 5 MG/5ML IJ SOLN
INTRAMUSCULAR | Status: AC
  Filled 2020-10-28: qty 5

## 2020-10-28 MED ORDER — DEXAMETHASONE SODIUM PHOSPHATE 10 MG/ML IJ SOLN
10.0000 mg | Freq: Once | INTRAMUSCULAR | Status: AC
  Administered 2020-10-28: 10 mg

## 2020-10-28 MED ORDER — FENTANYL CITRATE (PF) 100 MCG/2ML IJ SOLN: 25.0000 ug | INTRAMUSCULAR | Status: DC | PRN

## 2020-10-28 MED ORDER — LIDOCAINE HCL 2 % IJ SOLN
20.0000 mL | Freq: Once | INTRAMUSCULAR | Status: AC
  Administered 2020-10-28: 200 mg
  Filled 2020-10-28: qty 20

## 2020-10-28 MED ORDER — DEXAMETHASONE SODIUM PHOSPHATE 10 MG/ML IJ SOLN
INTRAMUSCULAR | Status: AC
  Filled 2020-10-28: qty 1

## 2020-10-28 MED ORDER — ALPRAZOLAM 1 MG PO TABS: ORAL_TABLET | ORAL | 0 refills | Status: DC

## 2020-10-28 MED ORDER — IOHEXOL 180 MG/ML  SOLN
INTRAMUSCULAR | Status: AC
  Filled 2020-10-28: qty 20

## 2020-10-28 MED ORDER — LACTATED RINGERS IV SOLN: 1000.0000 mL | Freq: Once | INTRAVENOUS | Status: DC

## 2020-10-28 MED ORDER — MIDAZOLAM HCL 5 MG/5ML IJ SOLN: 1.0000 mg | INTRAMUSCULAR | Status: DC | PRN

## 2020-10-28 NOTE — Progress Notes (Signed)
PROVIDER NOTE: Information contained herein reflects review and annotations entered in association with encounter. Interpretation of such information and data should be left to medically-trained personnel. Information provided to patient can be located elsewhere in the medical record under "Patient Instructions". Document created using STT-dictation technology, any transcriptional errors that may result from process are unintentional.    Patient: Caleb Edwards  Service Category: Procedure  Provider: Oswaldo DoneFrancisco A Jarmon Javid, MD  DOB: 09/14/1951  DOS: 10/28/2020  Location: [facility]  MRN: [id number]  Setting: Ambulatory - outpatient  Referring Provider: Delano MetzNaveira, Aysiah Jurado, MD  Type: Established Patient  Specialty: Interventional Pain Management  PCP: Antonieta Lovelessuffy, Molly M, MD   Primary Reason for Visit: Interventional Pain Management Treatment. CC: Shoulder Pain (bilateral)  Procedure:          Anesthesia, Analgesia, Anxiolysis:  Type: Diagnostic, Inter-Laminar, Cervical Epidural Steroid Injection  #1  Region: Posterior Cervico-thoracic Region Level: C7-T1 Laterality: Left-Sided Paramedial  Type: Local Anesthesia Indication(s): Analgesia         Route: Infiltration ([place]/IM) IV Access: Declined Sedation: Declined  Local Anesthetic: Lidocaine 1-2%  Position: Prone with head of the table was raised to facilitate breathing.   Indications: 1. Cervical radiculopathy (Left)   2. Cervical foraminal stenosis (Right: C5-C8) (Left: C6-C7)   3. DDD (degenerative disc disease), cervical   4. Cervicalgia   5. Cervical spondylosis   6. 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 Cervical facet arthropathy (Right: C4-5, C5-6)   8. Grade 1 Anterolisthesis of cervical spine (1-2 mm) (C4/C5)   9. Abnormal x-ray of cervical spine    Pain Score: Pre-procedure: 8 /10 Post-procedure: 3 /10   Pre-op H&P Assessment:  Caleb Edwards is a 70 y.o. (year old), male patient, seen today for interventional  treatment. He  has a past surgical history that includes Esophagogastroduodenoscopy (egd) with propofol (N/A, 01/03/2016); Colonoscopy with propofol (N/A, 08/02/2017); Esophagogastroduodenoscopy (egd) with propofol (08/02/2017); Esophagogastroduodenoscopy (egd) with propofol (N/A, 02/05/2018); Colonoscopy with propofol (N/A, 02/05/2018); Esophagogastroduodenoscopy (egd) with propofol (N/A, 03/30/2018); Esophagogastroduodenoscopy (egd) with propofol (N/A, 08/09/2019); Colonoscopy with propofol (N/A, 08/09/2019); Givens capsule study (N/A, 04/24/2020); and Esophagogastroduodenoscopy (egd) with propofol (N/A, 04/24/2020). Caleb Edwards has a current medication list which includes the following prescription(s): alprazolam, amlodipine, atorvastatin, chlorthalidone, ferrous sulfate, lisinopril, pantoprazole, polyethylene glycol, sildenafil, tramadol, and trazodone. His primarily concern today is the Shoulder Pain (bilateral)  Initial Vital Signs:  Pulse/HCG Rate: 66  Temp: (!) 97.1 F (36.2 C) Resp: 16 BP: (!) 149/84 SpO2: 100 %  BMI: Estimated body mass index is 24.41 kg/m as calculated from the following:   Height as of this encounter: 6' (1.829 m).   Weight as of this encounter: 180 lb (81.6 kg).  Risk Assessment: Allergies: Reviewed. He has No Known Allergies.  Allergy Precautions: None required Coagulopathies: Reviewed. None identified.  Blood-thinner therapy: None at this time Active Infection(s): Reviewed. None identified. Caleb Edwards is afebrile  Site Confirmation: Caleb Edwards was asked to confirm the procedure and laterality before marking the site Procedure checklist: Completed Consent: Before the procedure and under the influence of no sedative(s), amnesic(s), or anxiolytics, the patient was informed of the treatment options, risks and possible complications. To fulfill our ethical and legal obligations, as recommended by the American Medical Association's Code of Ethics, I have informed the patient of  my clinical impression; the nature and purpose of the treatment or procedure; the risks, benefits, and possible complications of the intervention; the alternatives, including doing nothing; the risk(s) and benefit(s) of the  alternative treatment(s) or procedure(s); and the risk(s) and benefit(s) of doing nothing. The patient was provided information about the general risks and possible complications associated with the procedure. These may include, but are not limited to: failure to achieve desired goals, infection, bleeding, organ or nerve damage, allergic reactions, paralysis, and death. In addition, the patient was informed of those risks and complications associated to Spine-related procedures, such as failure to decrease pain; infection (i.e.: Meningitis, epidural or intraspinal abscess); bleeding (i.e.: epidural hematoma, subarachnoid hemorrhage, or any other type of intraspinal or peri-dural bleeding); organ or nerve damage (i.e.: Any type of peripheral nerve, nerve root, or spinal cord injury) with subsequent damage to sensory, motor, and/or autonomic systems, resulting in permanent pain, numbness, and/or weakness of one or several areas of the body; allergic reactions; (i.e.: anaphylactic reaction); and/or death. Furthermore, the patient was informed of those risks and complications associated with the medications. These include, but are not limited to: allergic reactions (i.e.: anaphylactic or anaphylactoid reaction(s)); adrenal axis suppression; blood sugar elevation that in diabetics may result in ketoacidosis or comma; water retention that in patients with history of congestive heart failure may result in shortness of breath, pulmonary edema, and decompensation with resultant heart failure; weight gain; swelling or edema; medication-induced neural toxicity; particulate matter embolism and blood vessel occlusion with resultant organ, and/or nervous system infarction; and/or aseptic necrosis of one or  more joints. Finally, the patient was informed that Medicine is not an exact science; therefore, there is also the possibility of unforeseen or unpredictable risks and/or possible complications that may result in a catastrophic outcome. The patient indicated having understood very clearly. We have given the patient no guarantees and we have made no promises. Enough time was given to the patient to ask questions, all of which were answered to the patient's satisfaction. Mr. Sauseda has indicated that he wanted to continue with the procedure. Attestation: I, the ordering provider, attest that I have discussed with the patient the benefits, risks, side-effects, alternatives, likelihood of achieving goals, and potential problems during recovery for the procedure that I have provided informed consent. Date  Time: 10/28/2020 11:07 AM  Pre-Procedure Preparation:  Monitoring: As per clinic protocol. Respiration, ETCO2, SpO2, BP, heart rate and rhythm monitor placed and checked for adequate function Safety Precautions: Patient was assessed for positional comfort and pressure points before starting the procedure. Time-out: I initiated and conducted the "Time-out" before starting the procedure, as per protocol. The patient was asked to participate by confirming the accuracy of the "Time Out" information. Verification of the correct person, site, and procedure were performed and confirmed by me, the nursing staff, and the patient. "Time-out" conducted as per Joint Commission's Universal Protocol (UP.01.01.01). Time: 1145  Description of Procedure:          Target Area: For Epidural Steroid injections the target is the interlaminar space, initially targeting the lower border of the superior vertebral body lamina. Approach: Paramedial approach. Area Prepped: Entire PosteriorCervical Region DuraPrep (Iodine Povacrylex [0.7% available iodine] and Isopropyl Alcohol, 74% w/w) Safety Precautions: Aspiration looking for  blood return was conducted prior to all injections. At no point did we inject any substances, as a needle was being advanced. No attempts were made at seeking any paresthesias. Safe injection practices and needle disposal techniques used. Medications properly checked for expiration dates. SDV (single dose vial) medications used. Description of the Procedure: Protocol guidelines were followed. The procedure needle was introduced through the skin, ipsilateral to the reported pain, and advanced  to the target area. Bone was contacted and the needle walked caudad, until the lamina was cleared. The epidural space was identified using "loss-of-resistance technique" with 2-3 ml of PF-NaCl (0.9% NSS), in a 5cc LOR glass syringe. Vitals:   10/28/20 1107 10/28/20 1108 10/28/20 1145 10/28/20 1153  BP:  (!) 149/84 139/85 (!) 146/79  Pulse:  66 73 87  Resp:  16 13 13   Temp: (!) 97.1 F (36.2 C)     SpO2:  100% 100% 98%  Weight: 180 lb (81.6 kg)     Height: 6' (1.829 m)       Start Time: 1145 hrs. End Time: 1153 hrs. Materials:  Needle(s) Type: Epidural needle Gauge: 17G Length: 3.5-in Medication(s): Please see orders for medications and dosing details.  Imaging Guidance (Spinal):          Type of Imaging Technique: Fluoroscopy Guidance (Spinal) Indication(s): Assistance in needle guidance and placement for procedures requiring needle placement in or near specific anatomical locations not easily accessible without such assistance. Exposure Time: Please see nurses notes. Contrast: Before injecting any contrast, we confirmed that the patient did not have an allergy to iodine, shellfish, or radiological contrast. Once satisfactory needle placement was completed at the desired level, radiological contrast was injected. Contrast injected under live fluoroscopy. No contrast complications. See chart for type and volume of contrast used. Fluoroscopic Guidance: I was personally present during the use of  fluoroscopy. "Tunnel Vision Technique" used to obtain the best possible view of the target area. Parallax error corrected before commencing the procedure. "Direction-depth-direction" technique used to introduce the needle under continuous pulsed fluoroscopy. Once target was reached, antero-posterior, oblique, and lateral fluoroscopic projection used confirm needle placement in all planes. Images permanently stored in EMR. Interpretation: I personally interpreted the imaging intraoperatively. Adequate needle placement confirmed in multiple planes. Appropriate spread of contrast into desired area was observed. No evidence of afferent or efferent intravascular uptake. No intrathecal or subarachnoid spread observed. Permanent images saved into the patient's record.  Antibiotic Prophylaxis:   Anti-infectives (From admission, onward)   None     Indication(s): None identified  Post-operative Assessment:  Post-procedure Vital Signs:  Pulse/HCG Rate: 87 (nsr)  Temp: (!) 97.1 F (36.2 C) Resp: 13 BP: (!) 146/79 SpO2: 98 %  EBL: None  Complications: No immediate post-treatment complications observed by team, or reported by patient.  Note: The patient tolerated the entire procedure well. A repeat set of vitals were taken after the procedure and the patient was kept under observation following institutional policy, for this type of procedure. Post-procedural neurological assessment was performed, showing return to baseline, prior to discharge. The patient was provided with post-procedure discharge instructions, including a section on how to identify potential problems. Should any problems arise concerning this procedure, the patient was given instructions to immediately contact , at any time, without hesitation. In any case, we plan to contact the patient by telephone for a follow-up status report regarding this interventional procedure.  Comments:  No additional relevant information.  Plan of Care   Orders:  Orders Placed This Encounter  Procedures  . Cervical Epidural Injection    Procedure: Cervical Epidural Steroid Injection/Block Purpose: Diagnostic Indication(s): Radiculitis and cervicalgia associater with cervical degenerative disc disease.    Scheduling Instructions:     Level(s): C7-T1     Laterality: Left-sided     Sedation: Patient's choice.     Timeframe: Today    Order Specific Question:   Where will this procedure be performed?  Answer:   [facility]    Comments:   by Dr. Laban EmperorNaveira  . MR CERVICAL SPINE WO CONTRAST    Patient presents with axial pain with possible radicular component.  In addition to any acute findings, please report on:  1. Facet (Zygapophyseal) joint DJD (Hypertrophy, space narrowing, subchondral sclerosis, and/or osteophyte formation) 2. DDD and/or IVDD (Loss of disc height, desiccation or "Black disc disease") 3. Pars defects 4. Spondylolisthesis, spondylosis, and/or spondyloarthropathies (include Degree/Grade of displacement in mm) 5. Vertebral body Fractures, including age (old, new/acute) 6. Modic Type Changes 7. Demineralization 8. Bone pathology 9. Central, Lateral Recess, and/or Foraminal Stenosis (include AP diameter of stenosis in mm) 10. Surgical changes (hardware type, status, and presence of fibrosis) NOTE: Please specify level(s) and laterality.    Standing Status:   Future    Standing Expiration Date:   11/28/2020    Scheduling Instructions:     Imaging must be done as soon as possible. Inform patient that order will expire within 30 days and I will not renew it.    Order Specific Question:   What is the patient's sedation requirement?    Answer:   No Sedation    Order Specific Question:   Does the patient have a pacemaker or implanted devices?    Answer:   No    Order Specific Question:   Preferred imaging location?    Answer:   [facility] (table limit-350lbs)    Order Specific Question:   Call Results-  Best Contact Number?    Answer:   [phone] [facility])    Order Specific Question:   Radiology Contrast Protocol - do NOT remove file path    Answer:   [email or web address]  . [facility] PA[facility] C-ARM 1-60 MIN NO REPORT    Intraoperative interpretation by procedural physician at [facility].    Standing Status:   Standing    Number of Occurrences:   1    Order Specific Question:   Reason for exam:    Answer:   Assistance in needle guidance and placement for procedures requiring needle placement in or near specific anatomical locations not easily accessible without such assistance.  . Informed Consent Details: Physician/Practitioner Attestation; Transcribe to consent form and obtain patient signature    Nursing Order: Transcribe to consent form and obtain patient signature. Note: Always confirm laterality of pain with Caleb Edwards, before procedure.    Order Specific Question:   Physician/Practitioner attestation of informed consent for procedure/surgical case    Answer:   I, the physician/practitioner, attest that I have discussed with the patient the benefits, risks, side effects, alternatives, likelihood of achieving goals and potential problems during recovery for the procedure that I have provided informed consent.    Order Specific Question:   Procedure    Answer:   Cervical Epidural Steroid Injection (CESI) under fluoroscopic guidance    Order Specific Question:   Physician/Practitioner performing the procedure    Answer:   Syann Cupples A. Laban EmperorNaveira MD    Order Specific Question:   Indication/Reason    Answer:   Cervicalgia (Neck Pain) with or without Cervical Radiculopathy/Radiculitis (Arm/Shoulder Pain, Numbness, and/or weakness), secondary to Cervical and/or Cervicothoracic Degenerative Disc Disease (DDD), with or without Intervertebral Disc Displacement (IVD  . Provide equipment / supplies at bedside    "Epidural Tray" (Disposable  single  use) Catheter: NOT required    Standing Status:   Standing    Number of Occurrences:   1  Order Specific Question:   Specify    Answer:   Epidural Tray   Chronic Opioid Analgesic:  Tramadol 50 mg, 2 tabs PO q 6 hrs (400 mg/day of tramadol) (LAST UDS DONE 02/21/2020. Scanned wrong into system) MME/day:40 mg/day.   Medications ordered for procedure: Meds ordered this encounter  Medications  . ALPRAZolam (XANAX) 1 MG tablet    Sig: Take 1 tablet 45 minutes and 1 tablet just prior to MRI. Have a driver.    Dispense:  2 tablet    Refill:  0    Remind the patient not to drive after having taken this medication.  . iohexol (OMNIPAQUE) 180 MG/ML injection 10 mL    Must be Myelogram-compatible. If not available, you may substitute with a water-soluble, non-ionic, hypoallergenic, myelogram-compatible radiological contrast medium.  Marland Kitchen lidocaine (XYLOCAINE) 2 % (with pres) injection 400 mg  . DISCONTD: lactated ringers infusion 1,000 mL  . DISCONTD: midazolam (VERSED) 5 MG/5ML injection 1-2 mg    Make sure Flumazenil is available in the pyxis when using this medication. If oversedation occurs, administer 0.2 mg IV over 15 sec. If after 45 sec no response, administer 0.2 mg again over 1 min; may repeat at 1 min intervals; not to exceed 4 doses (1 mg)  . DISCONTD: fentaNYL (SUBLIMAZE) injection 25-50 mcg    Make sure Narcan is available in the pyxis when using this medication. In the event of respiratory depression (RR< 8/min): Titrate NARCAN (naloxone) in increments of 0.1 to 0.2 mg IV at 2-3 minute intervals, until desired degree of reversal.  . sodium chloride flush (NS) 0.9 % injection 1 mL  . ropivacaine (PF) 2 mg/mL (0.2%) (NAROPIN) injection 1 mL  . dexamethasone (DECADRON) injection 10 mg   Medications administered: We administered iohexol, lidocaine, sodium chloride flush, ropivacaine (PF) 2 mg/mL (0.2%), and dexamethasone.  See the medical record for exact dosing, route, and time of  administration.  Follow-up plan:   Return in about 2 weeks (around 11/11/2020) for (F2F), (PP) Follow-up.       Interventional treatment options:  Under consideration:   Diagnostic right IA hip joint injection  Diagnostic bilateral lumbar facet block  Possible bilateral lumbar facet RFA    Therapeutic/palliative (PRN):   Palliative left L3-4 LESI #3  Palliative left L4 TFESI #2  Palliative right L3 TFESI #2  Palliative right L4 TFESI #3         Recent Visits Date Type Provider Dept  09/22/20 Office Visit Delano Metz, MD [facility]  Showing recent visits within past 90 days and meeting all other requirements Today's Visits Date Type Provider Dept  10/28/20 Procedure visit Delano Metz, MD [facility]  Showing today's visits and meeting all other requirements Future Appointments Date Type Provider Dept  11/11/20 Appointment Delano Metz, MD [facility]  12/15/20 Appointment Delano Metz, MD [facility]  Showing future appointments within next 90 days and meeting all other requirements  Disposition: Discharge home  Discharge (Date  Time): 10/28/2020; 1201 hrs.   Primary Care Physician: Duffy, Barnabas Lister, MD Location: [facility] Note by: Oswaldo Done, MD Date: 10/28/2020; Time: 1:16 PM  Disclaimer:  Medicine is not an exact science. The only guarantee in medicine is that nothing is guaranteed. It is important to note that the decision to proceed with this intervention was based on the information collected from the patient. The Data and conclusions were drawn from the patient's questionnaire, the interview, and the  physical examination. Because the information was provided in large part by the patient, it cannot be guaranteed that it has not been purposely or unconsciously manipulated. Every effort has been made to obtain as much relevant data as possible for this evaluation. It is  important to note that the conclusions that lead to this procedure are derived in large part from the available data. Always take into account that the treatment will also be dependent on availability of resources and existing treatment guidelines, considered by other Pain Management Practitioners as being common knowledge and practice, at the time of the intervention. For Medico-Legal purposes, it is also important to point out that variation in procedural techniques and pharmacological choices are the acceptable norm. The indications, contraindications, technique, and results of the above procedure should only be interpreted and judged by a Board-Certified Interventional Pain Specialist with extensive familiarity and expertise in the same exact procedure and technique.

## 2020-10-28 NOTE — Patient Instructions (Addendum)
____________________________________________________________________________________________  Post-Procedure Discharge Instructions  Instructions:  Apply ice:   Purpose: This will minimize any swelling and discomfort after procedure.   When: Day of procedure, as soon as you get home.  How: Fill a plastic sandwich bag with crushed ice. Cover it with a small towel and apply to injection site.  How long: (15 min on, 15 min off) Apply for 15 minutes then remove x 15 minutes.  Repeat sequence on day of procedure, until you go to bed.  Apply heat:   Purpose: To treat any soreness and discomfort from the procedure.  When: Starting the next day after the procedure.  How: Apply heat to procedure site starting the day following the procedure.  How long: May continue to repeat daily, until discomfort goes away.  Food intake: Start with clear liquids (like water) and advance to regular food, as tolerated.   Physical activities: Keep activities to a minimum for the first 8 hours after the procedure. After that, then as tolerated.  Driving: If you have received any sedation, be responsible and do not drive. You are not allowed to drive for 24 hours after having sedation.  Blood thinner: (Applies only to those taking blood thinners) You may restart your blood thinner 6 hours after your procedure.  Insulin: (Applies only to Diabetic patients taking insulin) As soon as you can eat, you may resume your normal dosing schedule.  Infection prevention: Keep procedure site clean and dry. Shower daily and clean area with soap and water.  Post-procedure Pain Diary: Extremely important that this be done correctly and accurately. Recorded information will be used to determine the next step in treatment. For the purpose of accuracy, follow these rules:  Evaluate only the area treated. Do not report or include pain from an untreated area. For the purpose of this evaluation, ignore all other areas of pain,  except for the treated area.  After your procedure, avoid taking a long nap and attempting to complete the pain diary after you wake up. Instead, set your alarm clock to go off every hour, on the hour, for the initial 8 hours after the procedure. Document the duration of the numbing medicine, and the relief you are getting from it.  Do not go to sleep and attempt to complete it later. It will not be accurate. If you received sedation, it is likely that you were given a medication that may cause amnesia. Because of this, completing the diary at a later time may cause the information to be inaccurate. This information is needed to plan your care.  Follow-up appointment: Keep your post-procedure follow-up evaluation appointment after the procedure (usually 2 weeks for most procedures, 6 weeks for radiofrequencies). DO NOT FORGET to bring you pain diary with you.   Expect: (What should I expect to see with my procedure?)  From numbing medicine (AKA: Local Anesthetics): Numbness or decrease in pain. You may also experience some weakness, which if present, could last for the duration of the local anesthetic.  Onset: Full effect within 15 minutes of injected.  Duration: It will depend on the type of local anesthetic used. On the average, 1 to 8 hours.   From steroids (Applies only if steroids were used): Decrease in swelling or inflammation. Once inflammation is improved, relief of the pain will follow.  Onset of benefits: Depends on the amount of swelling present. The more swelling, the longer it will take for the benefits to be seen. In some cases, up to 10 days.    Duration: Steroids will stay in the system x 2 weeks. Duration of benefits will depend on multiple posibilities including persistent irritating factors.  Side-effects: If present, they may typically last 2 weeks (the duration of the steroids).  Frequent: Cramps (if they occur, drink Gatorade and take over-the-counter Magnesium 450-500 mg  once to twice a day); water retention with temporary weight gain; increases in blood sugar; decreased immune system response; increased appetite.  Occasional: Facial flushing (red, warm cheeks); mood swings; menstrual changes.  Uncommon: Long-term decrease or suppression of natural hormones; bone thinning. (These are more common with higher doses or more frequent use. This is why we prefer that our patients avoid having any injection therapies in other practices.)   Very Rare: Severe mood changes; psychosis; aseptic necrosis.  From procedure: Some discomfort is to be expected once the numbing medicine wears off. This should be minimal if ice and heat are applied as instructed.  Call if: (When should I call?)  You experience numbness and weakness that gets worse with time, as opposed to wearing off.  New onset bowel or bladder incontinence. (Applies only to procedures done in the spine)  Emergency Numbers:  Durning business hours (Monday - Thursday, 8:00 AM - 4:00 PM) (Friday, 9:00 AM - 12:00 Noon): [phone]  After hours: [phone]  NOTE: If you are having a problem and are unable connect with, or to talk to a provider, then go to your nearest urgent care or emergency department. If the problem is serious and urgent, please call 911. ____________________________________________________________________________________________   Epidural Steroid Injection  An epidural steroid injection is a shot of steroid medicine and numbing medicine that is given into the space between the spinal cord and the bones of the back (epidural space). The shot helps relieve pain caused by an irritated or swollen nerve root. The amount of pain relief you get from the injection depends on what is causing the nerve to be swollen and irritated, and how long your pain lasts. You are more likely to benefit from this injection if your pain is strong and comes on suddenly rather than if you have had  long-term (chronic) pain. Tell a health care provider about:  Any allergies you have.  All medicines you are taking, including vitamins, herbs, eye drops, creams, and over-the-counter medicines.  Any problems you or family members have had with anesthetic medicines.  Any blood disorders you have.  Any surgeries you have had.  Any medical conditions you have.  Whether you are pregnant or may be pregnant. What are the risks? Generally, this is a safe procedure. However, problems may occur, including:  Headache.  Bleeding.  Infection.  Allergic reaction to medicines.  Nerve damage. What happens before the procedure? Staying hydrated Follow instructions from your health care provider about hydration, which may include:  Up to 2 hours before the procedure - you may continue to drink clear liquids, such as water, clear fruit juice, black coffee, and plain tea. Eating and drinking restrictions Follow instructions from your health care provider about eating and drinking, which may include:  8 hours before the procedure - stop eating heavy meals or foods, such as meat, fried foods, or fatty foods.  6 hours before the procedure - stop eating light meals or foods, such as toast or cereal.  6 hours before the procedure - stop drinking milk or drinks that contain milk.  2 hours before the procedure - stop drinking clear liquids. Medicines  You may be given   medicines to lower anxiety.  Ask your health care provider about: ? Changing or stopping your regular medicines. This is especially important if you are taking diabetes medicines or blood thinners. ? Taking medicines such as aspirin and ibuprofen. These medicines can thin your blood. Do not take these medicines unless your health care provider tells you to take them. ? Taking over-the-counter medicines, vitamins, herbs, and supplements. General instructions  Ask your health care provider what steps will be taken to prevent  infection.  Plan to have a responsible adult take you home from the hospital or clinic.  If you will be going home right after the procedure, plan to have a responsible adult care for you for the time you are told. This is important. What happens during the procedure?  An IV will be inserted into one of your veins.  You will be given one or more of the following: ? A medicine to help you relax (sedative). ? A medicine to numb the area (local anesthetic).  You will be asked to lie on your abdomen or sit.  The injection site will be cleaned.  A needle will be inserted through your skin into the epidural space. This may cause you some discomfort. An X-ray machine will be used to guide the needle as close as possible to the affected nerve.  A steroid medicine and a local anesthetic will be injected into the epidural space.  The needle and IV will be removed.  A bandage (dressing) will be put over the injection site. The procedure may vary among health care providers and hospitals. What can I expect after the procedure?  Your blood pressure, heart rate, breathing rate, and blood oxygen level will be monitored until you leave the hospital or clinic.  Your arm or leg may feel weak or numb for a few hours.  The injection site may feel sore. Follow these instructions at home: Injection site care  You may remove the bandage (dressing) after 24 hours.  Check your injection site every day for signs of infection. Check for: ? Redness, swelling, or pain. ? Fluid or blood. ? Warmth. ? Pus or a bad smell. Managing pain, stiffness, and swelling  For 24 hours after the procedure: ? Avoid using heat on the injection site. ? Do not take baths, swim, or use a hot tub until your health care provider approves. Ask your health care provider if you may take showers. You may only be allowed to take sponge baths.   If directed, put ice on the injection site. To do this: ? Put ice in a plastic  bag. ? Place a towel between your skin and the bag. ? Leave the ice on for 20 minutes, 2-3 times a day.   Activity  If you were given a sedative during the procedure, it can affect you for several hours. Do not drive or operate machinery until your health care provider says that it is safe.  Return to your normal activities as told by your health care provider. Ask your health care provider what activities are safe for you. General instructions  Take over-the-counter and prescription medicines only as told by your health care provider.  Drink enough fluid to keep your urine pale yellow.  Keep all follow-up visits as told by your health care provider. This is important. Contact a health care provider if:  You have any of these signs of infection: ? Redness, swelling, or pain around your injection site. ? Fluid or blood coming   from your injection site. ? Warmth coming from your injection site. ? Pus or a bad smell coming from your injection site. ? A fever.  You continue to have pain and soreness around the injection site, even after taking over-the-counter pain medicine.  You have severe, sudden, or lasting nausea or vomiting. Get help right away if:  You have severe pain at the injection site that is not relieved by medicines.  You develop a severe headache or a stiff neck.  You become sensitive to light.  You have any new numbness or weakness in your legs or arms.  You lose control of your bladder or bowel movements.  You have trouble breathing. Summary  An epidural steroid injection is a shot of steroid medicine and numbing medicine that is given into the epidural space.  The shot helps relieve pain caused by an irritated or swollen nerve root.  You are more likely to benefit from this injection if your pain is strong and comes on suddenly rather than if you have had chronic pain. This information is not intended to replace advice given to you by your health care  provider. Make sure you discuss any questions you have with your health care provider. Document Revised: 01/18/2020 Document Reviewed: 04/02/2019 [employer] Patient Education  2021 [employer]  

## 2020-10-28 NOTE — Progress Notes (Signed)
Safety precautions to be maintained throughout the outpatient stay will include: orient to surroundings, keep bed in low position, maintain call bell within reach at all times, provide assistance with transfer out of bed and ambulation.  

## 2020-10-29 ENCOUNTER — Telehealth: Payer: Self-pay

## 2020-10-29 NOTE — Telephone Encounter (Signed)
Post procedure phone call. Patient states he is doing well.  

## 2020-11-10 NOTE — Progress Notes (Signed)
PROVIDER NOTE: Information contained herein reflects review and annotations entered in association with encounter. Interpretation of such information and data should be left to medically-trained personnel. Information provided to patient can be located elsewhere in the medical record under "Patient Instructions". Document created using STT-dictation technology, any transcriptional errors that may result from process are unintentional.    Patient: Caleb Edwards  Service Category: E/M  Provider: Gaspar Cola, MD  DOB: [birth year]  DOS: 11/11/2020  Specialty: Interventional Pain Management  MRN: [id number]  Setting: Ambulatory outpatient  PCP: Cherylann Parr, MD  Type: Established Patient    Referring Provider: Cherylann Parr, MD  Location: Office  Delivery: Face-to-face     HPI  Mr. LATRELL POTEMPA, a 70 y.o. year old male, is here today because of his Chronic pain syndrome [G89.4]. Mr. Weatherall primary complain today is No chief complaint on file. Last encounter: My last encounter with him was on 10/28/2020. Pertinent problems: Mr. 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 Pain Assessment: Severity of Chronic pain is reported as a 6 /10. Location: Neck Mid,Posterior,Upper/Pain from lower neck to base of head.. Onset: More than a month ago. Quality: Nagging,Aching ("just pain"). Timing: Constant. Modifying factor(s): excersing the next help. Vitals:  height is 6' (1.829 m) and weight is 180 lb (81.6 kg). His temporal temperature is 97.2 F (36.2 C) (abnormal). His blood pressure is 132/76 and his pulse is 86. His respiration is 18 and oxygen saturation is 100%.   Reason for encounter: post-procedure assessment.  According to the patient he attained 100% relief of the pain for the duration of the local anesthetic, which actually lasted for approximately 4 to 5 days.  After that the pain started coming back and currently he is enjoying 60% improvement in the area.  However, the patient describes that when he flexes the cervical spine and attempts to extend it, it seems to "catch".  This is rather disturbing since it confirms what we have seen on the flexion-extension x-rays of his cervical spine were he was detected that he has an unstable cervical spine.  Because of this, I will be referring the patient to a neurosurgeon for possible fusion.  I will also go ahead and schedule him for a second cervical epidural steroid injection.  Although the steroid injections may provide him with some temporary relief of the pain, they will certainly not fix his cervical instability.  Hopefully this can be addressed by a neurosurgeon, as soon as possible.  RTCB: 12/21/2020  Post-Procedure Evaluation  Procedure (10/28/2020): Diagnostic left cervical ESI #1 under fluoroscopic guidance, no sedation Pre-procedure pain level: 8/10 Post-procedure: 3/10 Limited initial benefit, possibly due to rapid discharge after no sedation procedure, without enough time to allow full onset of block.  Sedation: None.  Effectiveness during  initial hour after procedure(Ultra-Short Term Relief): 100 %.  Local anesthetic used: Long-acting (4-6 hours) Effectiveness: Defined as any analgesic benefit obtained secondary to the administration of local anesthetics. This carries significant diagnostic value as to the etiological location, or anatomical origin, of the pain. Duration of benefit is expected to coincide with the duration of the local anesthetic used.  Effectiveness during initial 4-6 hours after procedure(Short-Term Relief): 100 %.  Long-term benefit: Defined as any relief past the pharmacologic duration of the local anesthetics.  Effectiveness past the initial 6 hours after procedure(Long-Term Relief): 60 % (100% relief until day 4-5. Pain in better but feels like somehting is "catching" when moving neck).  Current benefits: Defined as benefit that persist at this time.   Analgesia:  60% ongoing relief of the pain Function: Mr. Harrower reports improvement in function ROM: Mr. Okerlund reports improvement in ROM  Pharmacotherapy Assessment   Analgesic: Tramadol 50 mg, 2 tabs PO q 6 hrs (400 mg/day of tramadol) (LAST UDS DONE 02/21/2020. Scanned wrong into system) MME/day:40 mg/day.   Monitoring: [place] PMP: PDMP reviewed during this encounter.       Pharmacotherapy: No side-effects or adverse reactions reported. Compliance: No problems identified. Effectiveness: Clinically acceptable.  Janne Napoleon, RN  11/11/2020  1:43 PM  Sign when Signing Visit Safety precautions to be maintained throughout the outpatient stay will include: orient to surroundings, keep bed in low position, maintain call bell within reach at all times, provide assistance with transfer out of bed and ambulation.     UDS:  Summary  Date Value Ref Range Status  03/24/2017 FINAL  Final    Comment:    ==================================================================== TOXASSURE SELECT 13  (MW) ==================================================================== Test                             Result       Flag       Units Drug Present   Tramadol                       PRESENT   O-Desmethyltramadol            PRESENT   N-Desmethyltramadol            PRESENT    Source of tramadol is a prescription medication.    O-desmethyltramadol and N-desmethyltramadol are expected    metabolites of tramadol. ==================================================================== Test                      Result    Flag   Units      Ref Range   Creatinine              125              mg/dL      >=20 ==================================================================== Declared Medications:  Medication list was not provided. ==================================================================== For clinical consultation, please call [phone]. ====================================================================      ROS  Constitutional: Denies any fever or chills Gastrointestinal: No reported hemesis, hematochezia, vomiting, or acute GI distress Musculoskeletal: Denies any acute onset joint swelling, redness, loss of ROM, or weakness Neurological: No reported episodes of acute onset apraxia, aphasia, dysarthria, agnosia, amnesia, paralysis, loss of coordination, or loss of consciousness  Medication Review  amLODipine, atorvastatin, chlorthalidone, ferrous sulfate, lisinopril, pantoprazole, polyethylene glycol, sildenafil, traMADol, and traZODone  History Review  Allergy: Mr. Banks has No Known Allergies. Drug: Mr. Benassi  reports  no history of drug use. Alcohol:  reports current alcohol use of about 1.0 standard drink of alcohol per week. Tobacco:  reports that he has quit smoking. He has never used smokeless tobacco. Social: Mr. Dube  reports that he has quit smoking. He has never used smokeless tobacco. He reports current alcohol use of about 1.0 standard drink of alcohol per  week. He reports that he does not use drugs. Medical:  has a past medical history of Back pain, Blood transfusion without reported diagnosis (12/2015), Diverticulitis, Hypertension, and Ulcer, stomach peptic, chronic. Surgical: Mr. Seyfried  has a past surgical history that includes Esophagogastroduodenoscopy (egd) with propofol (N/A, 01/03/2016); Colonoscopy with propofol (N/A, 08/02/2017); Esophagogastroduodenoscopy (egd) with propofol (08/02/2017); Esophagogastroduodenoscopy (egd) with propofol (N/A, 02/05/2018); Colonoscopy with propofol (N/A, 02/05/2018); Esophagogastroduodenoscopy (egd) with propofol (N/A, 03/30/2018); Esophagogastroduodenoscopy (egd) with propofol (N/A, 08/09/2019); Colonoscopy with propofol (N/A, 08/09/2019); Givens capsule study (N/A, 04/24/2020); and Esophagogastroduodenoscopy (egd) with propofol (N/A, 04/24/2020). Family: family history includes Diabetes in his mother; Heart disease in his father; Prostate cancer in his brother.  Laboratory Chemistry Profile   Renal Lab Results  Component Value Date   BUN 18 02/21/2020   CREATININE 1.24 02/21/2020   GFRAA >60 02/21/2020   GFRNONAA 60 (L) 02/21/2020     Hepatic Lab Results  Component Value Date   AST 23 08/09/2019   ALT 15 08/09/2019   ALBUMIN 3.8 08/09/2019   ALKPHOS 53 08/09/2019   LIPASE 17 08/08/2019     Electrolytes Lab Results  Component Value Date   NA 138 08/09/2019   K 3.9 08/09/2019   CL 106 08/09/2019   CALCIUM 8.7 (L) 08/09/2019   MG 1.9 01/04/2016     Bone Lab Results  Component Value Date   VD25OH 24.0 (L) 12/26/2015     Inflammation (CRP: Acute Phase) (ESR: Chronic Phase) Lab Results  Component Value Date   CRP 0.6 12/26/2015   ESRSEDRATE 3 12/26/2015       Note: Above Lab results reviewed.  Recent Imaging Review  DG PAIN CLINIC C-ARM 1-60 MIN NO REPORT Fluoro was used, but no Radiologist interpretation will be provided.  Please refer to "NOTES" tab for provider progress note. Note:  Reviewed        Physical Exam  General appearance: Well nourished, well developed, and well hydrated. In no apparent acute distress Mental status: Alert, oriented x 3 (person, place, & time)       Respiratory: No evidence of acute respiratory distress Eyes: PERLA Vitals: BP 132/76   Pulse 86   Temp (!) 97.2 F (36.2 C) (Temporal)   Resp 18   Ht 6' (1.829 m)   Wt 180 lb (81.6 kg)   SpO2 100%   BMI 24.41 kg/m  BMI: Estimated body mass index is 24.41 kg/m as calculated from the following:   Height as of this encounter: 6' (1.829 m).   Weight as of this encounter: 180 lb (81.6 kg). Ideal: Ideal body weight: 77.6 kg (171 lb 1.2 oz) Adjusted ideal body weight: 79.2 kg (174 lb 10.3 oz)  Assessment   Status Diagnosis  Controlled Controlled Controlled 1. Chronic pain syndrome   2. Cervical radiculopathy (Left)   3. Cervicalgia   4. Cervical foraminal stenosis (Right: C5-C8) (Left: C6-C7)   5. Cervical spondylosis   6. DDD (degenerative disc disease), cervical   7. Grade 1 Anterolisthesis of cervical spine (1-2 mm) (C4/C5)   8. 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 Pharmacologic therapy  Updated Problems: No problems updated.  Plan of Care  Problem-specific:  No problem-specific Assessment & Plan notes found for this encounter.  Mr. BAILEE THALL has a current medication list which includes the following long-term medication(s): atorvastatin, chlorthalidone, lisinopril, pantoprazole, tramadol, and trazodone.  Pharmacotherapy (Medications Ordered): No orders of the defined types were placed in this encounter.  Orders:  Orders Placed This Encounter  Procedures  . Cervical Epidural Injection    Level(s): C7-T1 Laterality: Left-sided Purpose: Diagnostic/Therapeutic Indication(s): Radiculitis and cervicalgia associater with cervical degenerative disc disease.    Standing Status:   Future    Standing Expiration Date:   12/09/2020    Scheduling Instructions:      Procedure: Cervical Epidural Steroid Injection/Block     Sedation: Patient's choice.     Timeframe: As soon as schedule allows    Order Specific Question:   Where will this procedure be performed?    Answer:   [facility]    Comments:   by Dr. Dossie Arbour  . Ambulatory referral to Neurosurgery    Referral Priority:   Routine    Referral Type:   Surgical    Referral Reason:   Specialty Services Required    Requested Specialty:   Neurosurgery    Number of Visits Requested:   1   Follow-up plan:   Return for Procedure (w/ sedation): (L) CESI #2.      Interventional Therapies  Risk  Complexity Considerations:   CKD HTN   Planned  Pending:   Pending further evaluation   Under consideration:   Diagnostic right IA hip joint injection  Diagnostic bilateral lumbar facet block  Possible bilateral lumbar facet RFA    Completed:   Diagnostic left cervical ESI x1 (10/28/2020)  Therapeutic left L3-4 LESI x2 (06/13/2018)  Therapeutic left L3 TFESI x1 (03/13/2020)  Therapeutic left L4 TFESI x2 (03/13/2020)  Therapeutic right L3 TFESI x2 (03/13/2020)  Therapeutic right L4 TFESI x3 (03/13/2020)    Therapeutic  Palliative (PRN) options:   Palliative left L3-4 LESI #3  Palliative left L3 TFESI #2  Palliative left L4 TFESI #3  Palliative right L3 TFESI #3  Palliative right L4 TFESI #4     Recent Visits Date Type Provider Dept  10/28/20 Procedure visit Milinda Pointer, MD [facility]  09/22/20 Office Visit Milinda Pointer, MD [facility]  Showing recent visits within past 90 days and meeting all other requirements Today's Visits Date Type Provider Dept  11/11/20 Office Visit Milinda Pointer, MD [facility]  Showing today's visits and meeting all other requirements Future Appointments Date Type Provider Dept  12/15/20 Appointment Milinda Pointer, MD [facility]  Showing future appointments within next 90 days and meeting all other  requirements  I discussed the assessment and treatment plan with the patient. The patient was provided an opportunity to ask questions and all were answered. The patient agreed with the plan and demonstrated an understanding of the instructions.  Patient advised to call back or seek an in-person evaluation if the symptoms or condition worsens.  Duration of encounter: 30 minutes.  Note by: Gaspar Cola, MD Date: 11/11/2020; Time: 2:01 PM

## 2020-11-11 ENCOUNTER — Other Ambulatory Visit: Payer: Self-pay

## 2020-11-11 ENCOUNTER — Ambulatory Visit: Payer: Medicare HMO | Attending: Pain Medicine | Admitting: Pain Medicine

## 2020-11-11 VITALS — BP 132/76 | HR 86 | Temp 97.2°F | Resp 18 | Ht 72.0 in | Wt 180.0 lb

## 2020-11-11 DIAGNOSIS — G894 Chronic pain syndrome: Secondary | ICD-10-CM

## 2020-11-11 DIAGNOSIS — M5030 Other cervical disc degeneration, unspecified cervical region: Secondary | ICD-10-CM | POA: Diagnosis present

## 2020-11-11 DIAGNOSIS — M542 Cervicalgia: Secondary | ICD-10-CM | POA: Diagnosis not present

## 2020-11-11 DIAGNOSIS — M4802 Spinal stenosis, cervical region: Secondary | ICD-10-CM | POA: Insufficient documentation

## 2020-11-11 DIAGNOSIS — M532X2 Spinal instabilities, cervical region: Secondary | ICD-10-CM | POA: Diagnosis present

## 2020-11-11 DIAGNOSIS — M47812 Spondylosis without myelopathy or radiculopathy, cervical region: Secondary | ICD-10-CM | POA: Diagnosis present

## 2020-11-11 DIAGNOSIS — Z79899 Other long term (current) drug therapy: Secondary | ICD-10-CM | POA: Diagnosis present

## 2020-11-11 DIAGNOSIS — M5412 Radiculopathy, cervical region: Secondary | ICD-10-CM | POA: Diagnosis not present

## 2020-11-11 DIAGNOSIS — M4312 Spondylolisthesis, cervical region: Secondary | ICD-10-CM

## 2020-11-11 NOTE — Patient Instructions (Signed)
____________________________________________________________________________________________  Preparing for Procedure with Sedation  Procedure appointments are limited to planned procedures: . No Prescription Refills. . No disability issues will be discussed. . No medication changes will be discussed.  Instructions: . Oral Intake: Do not eat or drink anything for at least 8 hours prior to your procedure. (Exception: Blood Pressure Medication. See below.) . Transportation: Unless otherwise stated by your physician, you may drive yourself after the procedure. . Blood Pressure Medicine: Do not forget to take your blood pressure medicine with a sip of water the morning of the procedure. If your Diastolic (lower reading)is above 100 mmHg, elective cases will be cancelled/rescheduled. . Blood thinners: These will need to be stopped for procedures. Notify our staff if you are taking any blood thinners. Depending on which one you take, there will be specific instructions on how and when to stop it. . Diabetics on insulin: Notify the staff so that you can be scheduled 1st case in the morning. If your diabetes requires high dose insulin, take only  of your normal insulin dose the morning of the procedure and notify the staff that you have done so. . Preventing infections: Shower with an antibacterial soap the morning of your procedure. . Build-up your immune system: Take 1000 mg of Vitamin C with every meal (3 times a day) the day prior to your procedure. . Antibiotics: Inform the staff if you have a condition or reason that requires you to take antibiotics before dental procedures. . Pregnancy: If you are pregnant, call and cancel the procedure. . Sickness: If you have a cold, fever, or any active infections, call and cancel the procedure. . Arrival: You must be in the facility at least 30 minutes prior to your scheduled procedure. . Children: Do not bring children with you. . Dress appropriately:  Bring dark clothing that you would not mind if they get stained. . Valuables: Do not bring any jewelry or valuables.  Reasons to call and reschedule or cancel your procedure: (Following these recommendations will minimize the risk of a serious complication.) . Surgeries: Avoid having procedures within 2 weeks of any surgery. (Avoid for 2 weeks before or after any surgery). . Flu Shots: Avoid having procedures within 2 weeks of a flu shots or . (Avoid for 2 weeks before or after immunizations). . Barium: Avoid having a procedure within 7-10 days after having had a radiological study involving the use of radiological contrast. (Myelograms, Barium swallow or enema study). . Heart attacks: Avoid any elective procedures or surgeries for the initial 6 months after a "Myocardial Infarction" (Heart Attack). . Blood thinners: It is imperative that you stop these medications before procedures. Let us know if you if you take any blood thinner.  . Infection: Avoid procedures during or within two weeks of an infection (including chest colds or gastrointestinal problems). Symptoms associated with infections include: Localized redness, fever, chills, night sweats or profuse sweating, burning sensation when voiding, cough, congestion, stuffiness, runny nose, sore throat, diarrhea, nausea, vomiting, cold or Flu symptoms, recent or current infections. It is specially important if the infection is over the area that we intend to treat. . Heart and lung problems: Symptoms that may suggest an active cardiopulmonary problem include: cough, chest pain, breathing difficulties or shortness of breath, dizziness, ankle swelling, uncontrolled high or unusually low blood pressure, and/or palpitations. If you are experiencing any of these symptoms, cancel your procedure and contact your primary care physician for an evaluation.  Remember:  Regular Business hours are:    Monday to Thursday 8:00 AM to 4:00 PM  Provider's  Schedule: Dorsie Burich, MD:  Procedure days: Tuesday and Thursday 7:30 AM to 4:00 PM  Bilal Lateef, MD:  Procedure days: Monday and Wednesday 7:30 AM to 4:00 PM ____________________________________________________________________________________________    

## 2020-11-11 NOTE — Progress Notes (Signed)
Safety precautions to be maintained throughout the outpatient stay will include: orient to surroundings, keep bed in low position, maintain call bell within reach at all times, provide assistance with transfer out of bed and ambulation.  

## 2020-11-28 ENCOUNTER — Other Ambulatory Visit: Payer: Self-pay

## 2020-11-28 ENCOUNTER — Ambulatory Visit
Admission: RE | Admit: 2020-11-28 | Discharge: 2020-11-28 | Disposition: A | Discharge status: Home / Self Care | Payer: Medicare HMO | Source: Ambulatory Visit | Attending: Pain Medicine | Admitting: Pain Medicine

## 2020-11-28 DIAGNOSIS — M5030 Other cervical disc degeneration, unspecified cervical region: Secondary | ICD-10-CM | POA: Diagnosis present

## 2020-11-28 DIAGNOSIS — R937 Abnormal findings on diagnostic imaging of other parts of musculoskeletal system: Secondary | ICD-10-CM | POA: Diagnosis present

## 2020-11-28 DIAGNOSIS — M4312 Spondylolisthesis, cervical region: Secondary | ICD-10-CM | POA: Diagnosis present

## 2020-11-28 DIAGNOSIS — M542 Cervicalgia: Secondary | ICD-10-CM | POA: Insufficient documentation

## 2020-11-28 DIAGNOSIS — M532X2 Spinal instabilities, cervical region: Secondary | ICD-10-CM | POA: Insufficient documentation

## 2020-11-28 DIAGNOSIS — M5412 Radiculopathy, cervical region: Secondary | ICD-10-CM | POA: Diagnosis not present

## 2020-11-28 DIAGNOSIS — M4802 Spinal stenosis, cervical region: Secondary | ICD-10-CM | POA: Insufficient documentation

## 2020-11-28 DIAGNOSIS — M47812 Spondylosis without myelopathy or radiculopathy, cervical region: Secondary | ICD-10-CM | POA: Diagnosis present

## 2020-12-04 ENCOUNTER — Ambulatory Visit: Payer: Medicare HMO | Admitting: Pain Medicine

## 2020-12-13 NOTE — Progress Notes (Deleted)
Procedure canceled by patient on morning of appointment.

## 2020-12-13 NOTE — Progress Notes (Signed)
PROVIDER NOTE: Information contained herein reflects review and annotations entered in association with encounter. Interpretation of such information and data should be left to medically-trained personnel. Information provided to patient can be located elsewhere in the medical record under "Patient Instructions". Document created using STT-dictation technology, any transcriptional errors that may result from process are unintentional.    Patient: Caleb Edwards  Service Category: E/M  Provider: Gaspar Cola, MD  DOB: [birth year]  DOS: 12/15/2020  Specialty: Interventional Pain Management  MRN: [id number]  Setting: Ambulatory outpatient  PCP: Caleb Parr, MD  Type: Established Patient    Referring Provider: Cherylann Parr, MD  Location: Office  Delivery: Face-to-face     HPI  [name], a 70 y.o. year old male, is here today because of his Chronic pain syndrome [G89.4]. [name] primary complain today is Shoulder Pain (bilateral) Last encounter: My last encounter with him was on 11/11/2020. Pertinent problems: 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); Lumbar facet syndrome (Bilateral) (R>L); Diffuse myofascial pain syndrome; L4-5 disc bulge; Lumbar foraminal stenosis (Bilateral) (L4-5) (L>R); Chronic neck pain; Cervical spondylosis; Chronic pain syndrome; Abdominal pain; Grade 1 Anterolisthesis (7-72m) of L5 over S1; DDD (degenerative disc disease), lumbosacral; Lumbar facet arthropathy; Chronic hip pain (Right); Cervical radiculopathy (Left); Cervicalgia; DDD (degenerative disc disease), cervical; Abnormal x-ray of cervical spine; 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 Pain Assessment: Severity of Chronic pain is reported as a 8 /10. Location: Shoulder Right,Left/ . Onset: More than a month ago. Quality: Aching,Constant. Timing: Constant. Modifying factor(s): medications, topicals. Vitals:  height is 6' (1.829 m) and weight is 178 lb (80.7 kg). His temporal temperature is 97.1 F (36.2 C) (abnormal). His blood pressure is 149/99 (abnormal) and his pulse is 88. His respiration is 18 and oxygen saturation is 100%.   Reason for encounter: medication management.   The patient indicates doing well with the current medication regimen. No adverse reactions or side effects reported to the medications.   RTCB: 03/21/2021  Pharmacotherapy Assessment   Analgesic: Tramadol 50 mg, 2 tabs PO q 6 hrs (400 mg/day of tramadol) (LAST UDS DONE 02/21/2020. Scanned wrong into system) MME/day:40 mg/day.   Monitoring:  PMP: PDMP reviewed during this encounter.       Pharmacotherapy: No side-effects or adverse reactions reported. Compliance: No problems identified. Effectiveness: Clinically acceptable.  SHart Rochester RN  12/15/2020  2:42 PM  Sign when Signing Visit Nursing Pain Medication Assessment:  Safety precautions to be maintained throughout the outpatient stay will include: orient to surroundings, keep bed in low position, maintain call bell within reach at all times, provide assistance with transfer out of bed and ambulation.  Medication Inspection Compliance: [name] not comply with our request to bring his pills to be counted. He was reminded that bringing the medication bottles, even when empty, is a requirement.  Medication: Tramadol (Ultram) Pill/Patch Count: No pills available to be counted. Pill/Patch Appearance: Markings consistent with prescribed medication Bottle Appearance: No container available. Did not bring bottle(s) to appointment. Filled Date: ? / ? / ? Last  Medication intake:  Today    UDS:  Summary  Date Value Ref Range Status  03/24/2017 FINAL  Final    Comment:    ==================================================================== TOXASSURE SELECT 13 (MW) ==================================================================== Test                             Result       Flag       Units Drug Present   Tramadol                       PRESENT   O-Desmethyltramadol            PRESENT   N-Desmethyltramadol            PRESENT    Source of tramadol is a prescription medication.    O-desmethyltramadol and N-desmethyltramadol are expected    metabolites of tramadol. ==================================================================== Test                      Result    Flag   Units      Ref Range   Creatinine              125              mg/dL      >=20 ==================================================================== Declared Medications:  Medication list was not provided. ==================================================================== For clinical consultation, please call [phone]. ====================================================================      ROS  Constitutional: Denies any fever or chills Gastrointestinal: No reported hemesis, hematochezia, vomiting, or acute GI distress Musculoskeletal: Denies any acute onset joint swelling, redness, loss of ROM, or weakness Neurological: No reported episodes of acute onset apraxia, aphasia, dysarthria, agnosia, amnesia, paralysis, loss of coordination, or loss of consciousness  Medication Review  amLODipine, atorvastatin, chlorthalidone, ferrous sulfate, lisinopril, pantoprazole, polyethylene glycol, sildenafil, traMADol, and traZODone  History Review  Allergy: [name] has No Known Allergies. Drug: [name]  reports no history of drug use. Alcohol:  reports current alcohol use of about 1.0 standard drink of alcohol per week. Tobacco:  reports that he has quit  smoking. He has never used smokeless tobacco. Social: [name]  reports that he has quit smoking. He has never used smokeless tobacco. He reports current alcohol use of about 1.0 standard drink of alcohol per week. He reports that he does not use drugs. Medical:  has a past medical history of Back pain, Blood transfusion without reported diagnosis (12/2015), Diverticulitis, Hypertension, and Ulcer, stomach peptic, chronic. Surgical: Mr. Fury  has a past surgical history that includes Esophagogastroduodenoscopy (egd) with propofol (N/A, 01/03/2016); Colonoscopy with propofol (N/A, 08/02/2017); Esophagogastroduodenoscopy (egd) with propofol (08/02/2017); Esophagogastroduodenoscopy (egd) with propofol (N/A, 02/05/2018); Colonoscopy with propofol (N/A, 02/05/2018); Esophagogastroduodenoscopy (egd) with propofol (N/A, 03/30/2018); Esophagogastroduodenoscopy (egd) with propofol (N/A, 08/09/2019); Colonoscopy with propofol (N/A, 08/09/2019); Givens capsule study (N/A, 04/24/2020); and Esophagogastroduodenoscopy (egd) with propofol (N/A, 04/24/2020). Family: family history includes Diabetes in his mother; Heart disease in his father; Prostate cancer in his brother.  Laboratory Chemistry Profile   Renal Lab Results  Component Value Date   BUN 18 02/21/2020   CREATININE 1.24 02/21/2020   GFRAA >60 02/21/2020   GFRNONAA 60 (L) 02/21/2020     Hepatic Lab Results  Component Value Date   AST 23 08/09/2019   ALT 15 08/09/2019   ALBUMIN 3.8 08/09/2019   ALKPHOS 53 08/09/2019   LIPASE 17 08/08/2019     Electrolytes Lab Results  Component Value Date   NA 138 08/09/2019   K 3.9 08/09/2019   CL  106 08/09/2019   CALCIUM 8.7 (L) 08/09/2019   MG 1.9 01/04/2016     Bone Lab Results  Component Value Date   VD25OH 24.0 (L) 12/26/2015     Inflammation (CRP: Acute Phase) (ESR: Chronic Phase) Lab Results  Component Value Date   CRP 0.6 12/26/2015   ESRSEDRATE 3 12/26/2015       Note: Above Lab results  reviewed.  Recent Imaging Review  MR CERVICAL SPINE WO CONTRAST CLINICAL DATA:  Chronic neck pain  EXAM: MRI CERVICAL SPINE WITHOUT CONTRAST  TECHNIQUE: Multiplanar, multisequence MR imaging of the cervical spine was performed. No intravenous contrast was administered.  COMPARISON:  MRI 03/26/2013  FINDINGS: Alignment: Straightening of the cervical lordosis with trace retrolisthesis of C3 on C4 and C6 on C7. Minimal grade 1 anterolisthesis C4 on C5.  Vertebrae: No fracture, evidence of discitis, or bone lesion.  Cord: Normal signal and morphology.  Posterior Fossa, vertebral arteries, paraspinal tissues: Negative.  Disc levels:  C2-C3: Minimal posterior disc osteophyte complex with bilateral facet arthropathy and uncovertebral spurring. Mild right foraminal stenosis. No canal stenosis. Unchanged.  C3-C4: Posterior disc osteophyte complex with bilateral facet and uncovertebral arthropathy. Severe bilateral foraminal stenosis with mild canal stenosis. Findings slightly progressed from prior.  C4-C5: Posterior disc osteophyte complex with right greater than left facet arthropathy. Mild-to-moderate right and mild left foraminal stenosis. No canal stenosis. Unchanged.  C5-C6: Posterior disc osteophyte complex with bilateral facet and uncovertebral arthropathy. Findings result in moderate canal stenosis with moderate to severe bilateral foraminal stenosis. Unchanged.  C6-C7: Left paracentral disc osteophyte complex and uncovertebral spurring result in moderate canal stenosis with moderate left and mild right foraminal stenosis. Unchanged.  C7-T1: No significant disc protrusion, foraminal stenosis, or canal stenosis.  IMPRESSION: 1. Multilevel cervical spondylosis of the cervical spine, with slight interval progression from the previous MRI. 2. Moderate canal stenosis with moderate to severe bilateral foramina stenosis at C5-6. 3. Moderate canal stenosis of C6-7. 4.  Severe bilateral foraminal stenosis at C3-4.  Electronically Signed   By: Davina Poke D.O.   On: 11/28/2020 17:00 Note: Reviewed        Physical Exam  General appearance: Well nourished, well developed, and well hydrated. In no apparent acute distress Mental status: Alert, oriented x 3 (person, place, & time)       Respiratory: No evidence of acute respiratory distress Eyes: PERLA Vitals: BP (!) 149/99   Pulse 88   Temp (!) 97.1 F (36.2 C) (Temporal)   Resp 18   Ht 6' (1.829 m)   Wt 178 lb (80.7 kg)   SpO2 100%   BMI 24.14 kg/m  BMI: Estimated body mass index is 24.14 kg/m as calculated from the following:   Height as of this encounter: 6' (1.829 m).   Weight as of this encounter: 178 lb (80.7 kg). Ideal: Ideal body weight: 77.6 kg (171 lb 1.2 oz) Adjusted ideal body weight: 78.9 kg (173 lb 13.5 oz)  Assessment   Status Diagnosis  Controlled Controlled Controlled 1. Chronic pain syndrome   2. Cervical radiculopathy (Left)   3. Cervicalgia   4. Cervical foraminal stenosis (Right: C5-C8) (Left: C6-C7)   5. Grade 1 Anterolisthesis of cervical spine (1-2 mm) (C4/C5)   6. 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 Pharmacologic therapy   8. Uncomplicated opioid dependence ([place])      Updated Problems: No problems updated.  Plan of Care  Problem-specific:  No problem-specific Assessment & Plan notes found for this encounter.  Mr. KRISTI HYER has a current medication list which includes the following long-term medication(s): atorvastatin, chlorthalidone, lisinopril, pantoprazole, [START ON 12/21/2020] tramadol, and trazodone.  Pharmacotherapy (Medications Ordered): Meds ordered this encounter  Medications  . traMADol (ULTRAM) 50 MG tablet    Sig: Take 2 tablets (100 mg total) by mouth every 6 (six) hours as needed for severe pain. Each refill must last 30 days.    Dispense:  240 tablet    Refill:  2    Chronic Pain: STOP Act (Not applicable) Fill 1 day early  if closed on refill date. Avoid benzodiazepines within 8 hours of opioids   Orders:  No orders of the defined types were placed in this encounter.  Follow-up plan:   Return in about 3 months (around 03/21/2021) for (F2F), (MM).      Interventional Therapies  Risk  Complexity Considerations:   CKD HTN   Planned  Pending:   Pending further evaluation   Under consideration:   Diagnostic right IA hip joint injection  Diagnostic bilateral lumbar facet block  Possible bilateral lumbar facet RFA    Completed:   Diagnostic left cervical ESI x1 (10/28/2020)  Therapeutic left L3-4 LESI x2 (06/13/2018)  Therapeutic left L3 TFESI x1 (03/13/2020)  Therapeutic left L4 TFESI x2 (03/13/2020)  Therapeutic right L3 TFESI x2 (03/13/2020)  Therapeutic right L4 TFESI x3 (03/13/2020)    Therapeutic  Palliative (PRN) options:   Palliative left L3-4 LESI #3  Palliative left L3 TFESI #2  Palliative left L4 TFESI #3  Palliative right L3 TFESI #3  Palliative right L4 TFESI #4      Recent Visits Date Type Provider Dept  11/11/20 Office Visit Milinda Pointer, MD [facility]  10/28/20 Procedure visit Milinda Pointer, [facility]  09/22/20 Office Visit Milinda Pointer, MD [facility]  Showing recent visits within past 90 days and meeting all other requirements Today's Visits Date Type Provider Dept  12/15/20 Office Visit Milinda Pointer, MD [facility]  Showing today's visits and meeting all other requirements Future Appointments Date Type Provider Dept  12/18/20 Appointment Milinda Pointer, MD [facility]  Showing future appointments within next 90 days and meeting all other requirements  I discussed the assessment and treatment plan with the patient. The patient was provided an opportunity to ask questions and all were answered. The patient agreed with the plan and demonstrated an understanding of the instructions.  Patient  advised to call back or seek an in-person evaluation if the symptoms or condition worsens.  Duration of encounter: 30 minutes.  Note by: Caleb Cola, MD Date: 12/15/2020; Time: 3:10 PM

## 2020-12-15 ENCOUNTER — Encounter: Payer: Self-pay | Admitting: Pain Medicine

## 2020-12-15 ENCOUNTER — Other Ambulatory Visit: Payer: Self-pay

## 2020-12-15 ENCOUNTER — Ambulatory Visit: Payer: Medicare HMO | Attending: Pain Medicine | Admitting: Pain Medicine

## 2020-12-15 VITALS — BP 149/99 | HR 88 | Temp 97.1°F | Resp 18 | Ht 72.0 in | Wt 178.0 lb

## 2020-12-15 DIAGNOSIS — M4802 Spinal stenosis, cervical region: Secondary | ICD-10-CM

## 2020-12-15 DIAGNOSIS — G894 Chronic pain syndrome: Secondary | ICD-10-CM

## 2020-12-15 DIAGNOSIS — M5412 Radiculopathy, cervical region: Secondary | ICD-10-CM | POA: Insufficient documentation

## 2020-12-15 DIAGNOSIS — F1120 Opioid dependence, uncomplicated: Secondary | ICD-10-CM | POA: Insufficient documentation

## 2020-12-15 DIAGNOSIS — M532X2 Spinal instabilities, cervical region: Secondary | ICD-10-CM

## 2020-12-15 DIAGNOSIS — M542 Cervicalgia: Secondary | ICD-10-CM | POA: Insufficient documentation

## 2020-12-15 DIAGNOSIS — Z79899 Other long term (current) drug therapy: Secondary | ICD-10-CM | POA: Insufficient documentation

## 2020-12-15 DIAGNOSIS — M4312 Spondylolisthesis, cervical region: Secondary | ICD-10-CM | POA: Insufficient documentation

## 2020-12-15 MED ORDER — TRAMADOL HCL 50 MG PO TABS: 100.0000 mg | ORAL_TABLET | Freq: Four times a day (QID) | ORAL | 2 refills | Status: DC | PRN

## 2020-12-15 NOTE — Patient Instructions (Signed)
____________________________________________________________________________________________  Medication Rules  Purpose: To inform patients, and their family members, of our rules and regulations.  Applies to: All patients receiving prescriptions (written or electronic).  Pharmacy of record: Pharmacy where electronic prescriptions will be sent. If written prescriptions are taken to a different pharmacy, please inform the nursing staff. The pharmacy listed in the electronic medical record should be the one where you would like electronic prescriptions to be sent.  Electronic prescriptions: In compliance with the [place] Strengthen Opioid Misuse Prevention (STOP) Act of 2017 (Session Law 2017-74/H243), effective October 04, 2018, all controlled substances must be electronically prescribed. Calling prescriptions to the pharmacy will cease to exist.  Prescription refills: Only during scheduled appointments. Applies to all prescriptions.  NOTE: The following applies primarily to controlled substances (Opioid* Pain Medications).   Type of encounter (visit): For patients receiving controlled substances, face-to-face visits are required. (Not an option or up to the patient.)  Patient's responsibilities: 1. Pain Pills: Bring all pain pills to every appointment (except for procedure appointments). 2. Pill Bottles: Bring pills in original pharmacy bottle. Always bring the newest bottle. Bring bottle, even if empty. 3. Medication refills: You are responsible for knowing and keeping track of what medications you take and those you need refilled. The day before your appointment: write a list of all prescriptions that need to be refilled. The day of the appointment: give the list to the admitting nurse. Prescriptions will be written only during appointments. No prescriptions will be written on procedure days. If you forget a medication: it will not be "Called in", "Faxed", or "electronically sent".  You will need to get another appointment to get these prescribed. No early refills. Do not call asking to have your prescription filled early. 4. Prescription Accuracy: You are responsible for carefully inspecting your prescriptions before leaving our office. Have the discharge nurse carefully go over each prescription with you, before taking them home. Make sure that your name is accurately spelled, that your address is correct. Check the name and dose of your medication to make sure it is accurate. Check the number of pills, and the written instructions to make sure they are clear and accurate. Make sure that you are given enough medication to last until your next medication refill appointment. 5. Taking Medication: Take medication as prescribed. When it comes to controlled substances, taking less pills or less frequently than prescribed is permitted and encouraged. Never take more pills than instructed. Never take medication more frequently than prescribed.  6. Inform other Doctors: Always inform, all of your healthcare providers, of all the medications you take. 7. Pain Medication from other Providers: You are not allowed to accept any additional pain medication from any other Doctor or Healthcare provider. There are two exceptions to this rule. (see below) In the event that you require additional pain medication, you are responsible for notifying us, as stated below. 8. Cough Medicine: Often these contain an opioid, such as codeine or hydrocodone. Never accept or take cough medicine containing these opioids if you are already taking an opioid* medication. The combination may cause respiratory failure and death. 9. Medication Agreement: You are responsible for carefully reading and following our Medication Agreement. This must be signed before receiving any prescriptions from our practice. Safely store a copy of your signed Agreement. Violations to the Agreement will result in no further prescriptions.  (Additional copies of our Medication Agreement are available upon request.) 10. Laws, Rules, & Regulations: All patients are expected to follow all   Federal and State Laws, Statutes, Rules, & Regulations. Ignorance of the Laws does not constitute a valid excuse.  11. Illegal drugs and Controlled Substances: The use of illegal substances (including, but not limited to marijuana and its derivatives) and/or the illegal use of any controlled substances is strictly prohibited. Violation of this rule may result in the immediate and permanent discontinuation of any and all prescriptions being written by our practice. The use of any illegal substances is prohibited. 12. Adopted CDC guidelines & recommendations: Target dosing levels will be at or below 60 MME/day. Use of benzodiazepines** is not recommended.  Exceptions: There are only two exceptions to the rule of not receiving pain medications from other Healthcare Providers. 1. Exception #1 (Emergencies): In the event of an emergency (i.e.: accident requiring emergency care), you are allowed to receive additional pain medication. However, you are responsible for: As soon as you are able, call our office [phone], at any time of the day or night, and leave a message stating your name, the date and nature of the emergency, and the name and dose of the medication prescribed. In the event that your call is answered by a member of our staff, make sure to document and save the date, time, and the name of the person that took your information.  2. Exception #2 (Planned Surgery): In the event that you are scheduled by another doctor or dentist to have any type of surgery or procedure, you are allowed (for a period no longer than 30 days), to receive additional pain medication, for the acute post-op pain. However, in this case, you are responsible for picking up a copy of our "Post-op Pain Management for Surgeons" handout, and giving it to your surgeon or dentist. This  document is available at our office, and does not require an appointment to obtain it. Simply go to our office during business hours (Monday-Thursday from 8:00 AM to 4:00 PM) (Friday 8:00 AM to 12:00 Noon) or if you have a scheduled appointment with us, prior to your surgery, and ask for it by name. In addition, you are responsible for: calling our office [phone], at any time of the day or night, and leaving a message stating your name, name of your surgeon, type of surgery, and date of procedure or surgery. Failure to comply with your responsibilities may result in termination of therapy involving the controlled substances.  *Opioid medications include: morphine, codeine, oxycodone, oxymorphone, hydrocodone, hydromorphone, meperidine, tramadol, tapentadol, buprenorphine, fentanyl, methadone. **Benzodiazepine medications include: diazepam (Valium), alprazolam (Xanax), clonazepam (Klonopine), lorazepam (Ativan), clorazepate (Tranxene), chlordiazepoxide (Librium), estazolam (Prosom), oxazepam (Serax), temazepam (Restoril), triazolam (Halcion) (Last updated: 09/01/2020) ____________________________________________________________________________________________   ____________________________________________________________________________________________  Medication Recommendations and Reminders  Applies to: All patients receiving prescriptions (written and/or electronic).  Medication Rules & Regulations: These rules and regulations exist for your safety and that of others. They are not flexible and neither are we. Dismissing or ignoring them will be considered "non-compliance" with medication therapy, resulting in complete and irreversible termination of such therapy. (See document titled "Medication Rules" for more details.) In all conscience, because of safety reasons, we cannot continue providing a therapy where the patient does not follow instructions.  Pharmacy of record:   Definition:  This is the pharmacy where your electronic prescriptions will be sent.   We do not endorse any particular pharmacy, however, we have experienced problems with [facility] not securing enough medication supply for the community.  We do not restrict you in your choice of pharmacy. However,   once we write for your prescriptions, we will NOT be re-sending more prescriptions to fix restricted supply problems created by your pharmacy, or your insurance.   The pharmacy listed in the electronic medical record should be the one where you want electronic prescriptions to be sent.  If you choose to change pharmacy, simply notify our nursing staff.  Recommendations:  Keep all of your pain medications in a safe place, under lock and key, even if you live alone. We will NOT replace lost, stolen, or damaged medication.  After you fill your prescription, take 1 week's worth of pills and put them away in a safe place. You should keep a separate, properly labeled bottle for this purpose. The remainder should be kept in the original bottle. Use this as your primary supply, until it runs out. Once it's gone, then you know that you have 1 week's worth of medicine, and it is time to come in for a prescription refill. If you do this correctly, it is unlikely that you will ever run out of medicine.  To make sure that the above recommendation works, it is very important that you make sure your medication refill appointments are scheduled at least 1 week before you run out of medicine. To do this in an effective manner, make sure that you do not leave the office without scheduling your next medication management appointment. Always ask the nursing staff to show you in your prescription , when your medication will be running out. Then arrange for the receptionist to get you a return appointment, at least 7 days before you run out of medicine. Do not wait until you have 1 or 2 pills left, to come in. This is very poor planning and  does not take into consideration that we may need to cancel appointments due to bad weather, sickness, or emergencies affecting our staff.  DO NOT ACCEPT A "Partial Fill": If for any reason your pharmacy does not have enough pills/tablets to completely fill or refill your prescription, do not allow for a "partial fill". The law allows the pharmacy to complete that prescription within 72 hours, without requiring a new prescription. If they do not fill the rest of your prescription within those 72 hours, you will need a separate prescription to fill the remaining amount, which we will NOT provide. If the reason for the partial fill is your insurance, you will need to talk to the pharmacist about payment alternatives for the remaining tablets, but again, DO NOT ACCEPT A PARTIAL FILL, unless you can trust your pharmacist to obtain the remainder of the pills within 72 hours.  Prescription refills and/or changes in medication(s):   Prescription refills, and/or changes in dose or medication, will be conducted only during scheduled medication management appointments. (Applies to both, written and electronic prescriptions.)  No refills on procedure days. No medication will be changed or started on procedure days. No changes, adjustments, and/or refills will be conducted on a procedure day. Doing so will interfere with the diagnostic portion of the procedure.  No phone refills. No medications will be "called into the pharmacy".  No Fax refills.  No weekend refills.  No Holliday refills.  No after hours refills.  Remember:  Business hours are:  Monday to Thursday 8:00 AM to 4:00 PM Provider's Schedule: Francisco Naveira, MD - Appointments are:  Medication management: Monday and Wednesday 8:00 AM to 4:00 PM Procedure day: Tuesday and Thursday 7:30 AM to 4:00 PM Bilal Lateef, MD - Appointments are:    Medication management: Tuesday and Thursday 8:00 AM to 4:00 PM Procedure day: Monday and Wednesday  7:30 AM to 4:00 PM (Last update: 04/23/2020) ____________________________________________________________________________________________    

## 2020-12-15 NOTE — Progress Notes (Signed)
Nursing Pain Medication Assessment:  Safety precautions to be maintained throughout the outpatient stay will include: orient to surroundings, keep bed in low position, maintain call bell within reach at all times, provide assistance with transfer out of bed and ambulation.  Medication Inspection Compliance: Caleb Edwards did not comply with our request to bring his pills to be counted. He was reminded that bringing the medication bottles, even when empty, is a requirement.  Medication: Tramadol (Ultram) Pill/Patch Count: No pills available to be counted. Pill/Patch Appearance: Markings consistent with prescribed medication Bottle Appearance: No container available. Did not bring bottle(s) to appointment. Filled Date: ? / ? / ? Last Medication intake:  Today

## 2020-12-18 ENCOUNTER — Ambulatory Visit: Payer: Medicare HMO | Admitting: Pain Medicine

## 2021-01-15 ENCOUNTER — Ambulatory Visit: Payer: Medicare HMO | Admitting: Pain Medicine

## 2021-02-05 ENCOUNTER — Other Ambulatory Visit: Payer: Self-pay

## 2021-02-05 ENCOUNTER — Encounter: Payer: Self-pay | Admitting: Pain Medicine

## 2021-02-05 ENCOUNTER — Ambulatory Visit
Admission: RE | Admit: 2021-02-05 | Discharge: 2021-02-05 | Disposition: A | Discharge status: Home / Self Care | Payer: Medicare HMO | Source: Ambulatory Visit | Attending: Pain Medicine | Admitting: Pain Medicine

## 2021-02-05 ENCOUNTER — Ambulatory Visit (HOSPITAL_BASED_OUTPATIENT_CLINIC_OR_DEPARTMENT_OTHER): Payer: Medicare HMO | Admitting: Pain Medicine

## 2021-02-05 VITALS — BP 121/88 | HR 86 | Temp 97.2°F | Resp 16 | Ht 72.0 in | Wt 178.0 lb

## 2021-02-05 DIAGNOSIS — M4802 Spinal stenosis, cervical region: Secondary | ICD-10-CM | POA: Insufficient documentation

## 2021-02-05 DIAGNOSIS — M47812 Spondylosis without myelopathy or radiculopathy, cervical region: Secondary | ICD-10-CM | POA: Insufficient documentation

## 2021-02-05 DIAGNOSIS — M542 Cervicalgia: Secondary | ICD-10-CM | POA: Insufficient documentation

## 2021-02-05 DIAGNOSIS — M5030 Other cervical disc degeneration, unspecified cervical region: Secondary | ICD-10-CM | POA: Diagnosis present

## 2021-02-05 DIAGNOSIS — R937 Abnormal findings on diagnostic imaging of other parts of musculoskeletal system: Secondary | ICD-10-CM

## 2021-02-05 DIAGNOSIS — M532X2 Spinal instabilities, cervical region: Secondary | ICD-10-CM | POA: Insufficient documentation

## 2021-02-05 DIAGNOSIS — M5412 Radiculopathy, cervical region: Secondary | ICD-10-CM | POA: Diagnosis present

## 2021-02-05 MED ORDER — LACTATED RINGERS IV SOLN: 1000.0000 mL | Freq: Once | INTRAVENOUS | Status: DC

## 2021-02-05 MED ORDER — MIDAZOLAM HCL 5 MG/5ML IJ SOLN: 1.0000 mg | INTRAMUSCULAR | Status: DC | PRN

## 2021-02-05 MED ORDER — DEXAMETHASONE SODIUM PHOSPHATE 10 MG/ML IJ SOLN
10.0000 mg | Freq: Once | INTRAMUSCULAR | Status: AC
  Administered 2021-02-05: 10 mg
  Filled 2021-02-05: qty 1

## 2021-02-05 MED ORDER — FENTANYL CITRATE (PF) 100 MCG/2ML IJ SOLN
25.0000 ug | INTRAMUSCULAR | Status: DC | PRN
Start: 2021-02-05 — End: 2021-02-05

## 2021-02-05 MED ORDER — ROPIVACAINE HCL 2 MG/ML IJ SOLN
1.0000 mL | Freq: Once | INTRAMUSCULAR | Status: AC
Start: 2021-02-05 — End: 2021-02-05
  Administered 2021-02-05: 1 mL via EPIDURAL
  Filled 2021-02-05: qty 10

## 2021-02-05 MED ORDER — IOHEXOL 180 MG/ML  SOLN
10.0000 mL | Freq: Once | INTRAMUSCULAR | Status: AC
  Administered 2021-02-05: 10 mL via EPIDURAL

## 2021-02-05 MED ORDER — SODIUM CHLORIDE 0.9% FLUSH
1.0000 mL | Freq: Once | INTRAVENOUS | Status: AC
  Administered 2021-02-05: 1 mL

## 2021-02-05 MED ORDER — SODIUM CHLORIDE (PF) 0.9 % IJ SOLN
INTRAMUSCULAR | Status: AC
  Filled 2021-02-05: qty 10

## 2021-02-05 MED ORDER — LIDOCAINE HCL 2 % IJ SOLN
20.0000 mL | Freq: Once | INTRAMUSCULAR | Status: AC
  Administered 2021-02-05: 400 mg
  Filled 2021-02-05: qty 20

## 2021-02-05 NOTE — Progress Notes (Signed)
Safety precautions to be maintained throughout the outpatient stay will include: orient to surroundings, keep bed in low position, maintain call bell within reach at all times, provide assistance with transfer out of bed and ambulation.  

## 2021-02-05 NOTE — Progress Notes (Signed)
PROVIDER NOTE: Information contained herein reflects review and annotations entered in association with encounter. Interpretation of such information and data should be left to medically-trained personnel. Information provided to patient can be located elsewhere in the medical record under "Patient Instructions". Document created using STT-dictation technology, any transcriptional errors that may result from process are unintentional.    Patient: Caleb Edwards  Service Category: Procedure  Provider: Oswaldo Done, MD  DOB: [birth year]  DOS: 02/05/2021  Location: [facility]  MRN: [id number]  Setting: Ambulatory - outpatient  Referring Provider: Antonieta Loveless, MD  Type: Established Patient  Specialty: Interventional Pain Management  PCP: Antonieta Loveless, MD   Primary Reason for Visit: Interventional Pain Management Treatment. CC: Neck Pain and Shoulder Pain (left)  Procedure:          Anesthesia, Analgesia, Anxiolysis:  Type: Therapeutic, Inter-Laminar, Cervical Epidural Steroid Injection  #2  Region: Posterior Cervico-thoracic Region Level: C7-T1 Laterality: Left-Sided Paramedial  Type: Local Anesthesia Indication(s): Analgesia         Route: Infiltration (/IM) IV Access: Declined Sedation: Declined  Local Anesthetic: Lidocaine 1-2%  Position: Prone with head of the table was raised to facilitate breathing.   Indications: 1. Cervicalgia   2. Cervical radiculopathy (Left)   3. Cervical foraminal stenosis (Right: C5-C8) (Left: C6-C7)   4. DDD (degenerative disc disease), cervical   5. 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 Abnormal x-ray of cervical spine    Pain Score: Pre-procedure: 8 /10 Post-procedure: 0-No pain/10   Pre-op H&P Assessment:  Caleb Edwards is a 70 y.o. (year old), male patient, seen today for interventional treatment. He  has a past surgical history that includes Esophagogastroduodenoscopy  (egd) with propofol (N/A, 01/03/2016); Colonoscopy with propofol (N/A, 08/02/2017); Esophagogastroduodenoscopy (egd) with propofol (08/02/2017); Esophagogastroduodenoscopy (egd) with propofol (N/A, 02/05/2018); Colonoscopy with propofol (N/A, 02/05/2018); Esophagogastroduodenoscopy (egd) with propofol (N/A, 03/30/2018); Esophagogastroduodenoscopy (egd) with propofol (N/A, 08/09/2019); Colonoscopy with propofol (N/A, 08/09/2019); Givens capsule study (N/A, 04/24/2020); and Esophagogastroduodenoscopy (egd) with propofol (N/A, 04/24/2020). Caleb Edwards has a current medication list which includes the following prescription(s): amlodipine, atorvastatin, chlorthalidone, ferrous sulfate, lisinopril, pantoprazole, polyethylene glycol, sildenafil, tramadol, and trazodone, and the following Facility-Administered Medications: dexamethasone, iohexol, lidocaine, ropivacaine (pf) 2 mg/ml (0.2%), and sodium chloride flush. His primarily concern today is the Neck Pain and Shoulder Pain (left)  Initial Vital Signs:  Pulse/HCG Rate: 69  Temp: (!) 97.2 F (36.2 C) Resp: 18 BP: 137/73 SpO2: 100 %  BMI: Estimated body mass index is 24.14 kg/m as calculated from the following:   Height as of this encounter: 6' (1.829 m).   Weight as of this encounter: 178 lb (80.7 kg).  Risk Assessment: Allergies: Reviewed. He has No Known Allergies.  Allergy Precautions: None required Coagulopathies: Reviewed. None identified.  Blood-thinner therapy: None at this time Active Infection(s): Reviewed. None identified. Caleb Edwards is afebrile  Site Confirmation: Caleb Edwards was asked to confirm the procedure and laterality before marking the site Procedure checklist: Completed Consent: Before the procedure and under the influence of no sedative(s), amnesic(s), or anxiolytics, the patient was informed of the treatment options, risks and possible complications. To fulfill our ethical and legal obligations, as recommended by the American Medical  Association's Code of Ethics, I have informed the patient of my clinical impression; the nature and purpose of the treatment or procedure; the risks, benefits, and possible complications of the intervention; the alternatives, including doing nothing; the  risk(s) and benefit(s) of the alternative treatment(s) or procedure(s); and the risk(s) and benefit(s) of doing nothing. The patient was provided information about the general risks and possible complications associated with the procedure. These may include, but are not limited to: failure to achieve desired goals, infection, bleeding, organ or nerve damage, allergic reactions, paralysis, and death. In addition, the patient was informed of those risks and complications associated to Spine-related procedures, such as failure to decrease pain; infection (i.e.: Meningitis, epidural or intraspinal abscess); bleeding (i.e.: epidural hematoma, subarachnoid hemorrhage, or any other type of intraspinal or peri-dural bleeding); organ or nerve damage (i.e.: Any type of peripheral nerve, nerve root, or spinal cord injury) with subsequent damage to sensory, motor, and/or autonomic systems, resulting in permanent pain, numbness, and/or weakness of one or several areas of the body; allergic reactions; (i.e.: anaphylactic reaction); and/or death. Furthermore, the patient was informed of those risks and complications associated with the medications. These include, but are not limited to: allergic reactions (i.e.: anaphylactic or anaphylactoid reaction(s)); adrenal axis suppression; blood sugar elevation that in diabetics may result in ketoacidosis or comma; water retention that in patients with history of congestive heart failure may result in shortness of breath, pulmonary edema, and decompensation with resultant heart failure; weight gain; swelling or edema; medication-induced neural toxicity; particulate matter embolism and blood vessel occlusion with resultant organ, and/or  nervous system infarction; and/or aseptic necrosis of one or more joints. Finally, the patient was informed that Medicine is not an exact science; therefore, there is also the possibility of unforeseen or unpredictable risks and/or possible complications that may result in a catastrophic outcome. The patient indicated having understood very clearly. We have given the patient no guarantees and we have made no promises. Enough time was given to the patient to ask questions, all of which were answered to the patient's satisfaction. [name] has indicated that he wanted to continue with the procedure. Attestation: I, the ordering provider, attest that I have discussed with the patient the benefits, risks, side-effects, alternatives, likelihood of achieving goals, and potential problems during recovery for the procedure that I have provided informed consent. Date  Time: 02/05/2021 10:46 AM  Pre-Procedure Preparation:  Monitoring: As per clinic protocol. Respiration, ETCO2, SpO2, BP, heart rate and rhythm monitor placed and checked for adequate function Safety Precautions: Patient was assessed for positional comfort and pressure points before starting the procedure. Time-out: I initiated and conducted the "Time-out" before starting the procedure, as per protocol. The patient was asked to participate by confirming the accuracy of the "Time Out" information. Verification of the correct person, site, and procedure were performed and confirmed by me, the nursing staff, and the patient. "Time-out" conducted as per Joint Commission's Universal Protocol (UP.01.01.01). Time: 1147  Description of Procedure:          Target Area: For Epidural Steroid injections the target is the interlaminar space, initially targeting the lower border of the superior vertebral body lamina. Approach: Paramedial approach. Area Prepped: Entire PosteriorCervical Region DuraPrep (Iodine Povacrylex [0.7% available iodine] and Isopropyl  Alcohol, 74% w/w) Safety Precautions: Aspiration looking for blood return was conducted prior to all injections. At no point did we inject any substances, as a needle was being advanced. No attempts were made at seeking any paresthesias. Safe injection practices and needle disposal techniques used. Medications properly checked for expiration dates. SDV (single dose vial) medications used. Description of the Procedure: Protocol guidelines were followed. The procedure needle was introduced through the skin, ipsilateral to  the reported pain, and advanced to the target area. Bone was contacted and the needle walked caudad, until the lamina was cleared. The epidural space was identified using "loss-of-resistance technique" with 2-3 ml of PF-NaCl (0.9% NSS), in a 5cc LOR glass syringe. Vitals:   02/05/21 1046 02/05/21 1140 02/05/21 1150 02/05/21 1156  BP: 137/73 125/81 (!) 116/93 121/88  Pulse: 69 88 88 86  Resp: 18 19 15 16   Temp:      SpO2: 100% 100% 100% 100%  Weight:      Height:        Start Time: 1147 hrs. End Time:   hrs. Materials:  Needle(s) Type: Epidural needle Gauge: 17G Length: 3.5-in Medication(s): Please see orders for medications and dosing details.  Imaging Guidance (Spinal):          Type of Imaging Technique: Fluoroscopy Guidance (Spinal) Indication(s): Assistance in needle guidance and placement for procedures requiring needle placement in or near specific anatomical locations not easily accessible without such assistance. Exposure Time: Please see nurses notes. Contrast: Before injecting any contrast, we confirmed that the patient did not have an allergy to iodine, shellfish, or radiological contrast. Once satisfactory needle placement was completed at the desired level, radiological contrast was injected. Contrast injected under live fluoroscopy. No contrast complications. See chart for type and volume of contrast used. Fluoroscopic Guidance: I was personally present during  the use of fluoroscopy. "Tunnel Vision Technique" used to obtain the best possible view of the target area. Parallax error corrected before commencing the procedure. "Direction-depth-direction" technique used to introduce the needle under continuous pulsed fluoroscopy. Once target was reached, antero-posterior, oblique, and lateral fluoroscopic projection used confirm needle placement in all planes. Images permanently stored in EMR. Interpretation: I personally interpreted the imaging intraoperatively. Adequate needle placement confirmed in multiple planes. Appropriate spread of contrast into desired area was observed. No evidence of afferent or efferent intravascular uptake. No intrathecal or subarachnoid spread observed. Permanent images saved into the patient's record.  Antibiotic Prophylaxis:   Anti-infectives (From admission, onward)   None     Indication(s): None identified  Post-operative Assessment:  Post-procedure Vital Signs:  Pulse/HCG Rate: 86  Temp: (!) 97.2 F (36.2 C) Resp: 16 BP: 121/88 SpO2: 100 %  EBL: None  Complications: No immediate post-treatment complications observed by team, or reported by patient.  Note: The patient tolerated the entire procedure well. A repeat set of vitals were taken after the procedure and the patient was kept under observation following institutional policy, for this type of procedure. Post-procedural neurological assessment was performed, showing return to baseline, prior to discharge. The patient was provided with post-procedure discharge instructions, including a section on how to identify potential problems. Should any problems arise concerning this procedure, the patient was given instructions to immediately contact , at any time, without hesitation. In any case, we plan to contact the patient by telephone for a follow-up status report regarding this interventional procedure.  Comments:  No additional relevant information.  Plan of  Care  Orders:  Orders Placed This Encounter  Procedures  . Cervical Epidural Injection    Procedure: Cervical Epidural Steroid Injection/Block Purpose: Therapeutic Indication(s): Radiculitis and cervicalgia associater with cervical degenerative disc disease.    Scheduling Instructions:     Level(s): C7-T1     Laterality: Left-sided     Sedation: Patient's choice.     Timeframe: Today    Order Specific Question:   Where will this procedure be performed?    Answer:  [facility]    Comments:   by Dr. Laban Emperor  . [facility] C-ARM 1-60 MIN NO REPORT    Intraoperative interpretation by procedural physician at [facility].    Standing Status:   Standing    Number of Occurrences:   1    Order Specific Question:   Reason for exam:    Answer:   Assistance in needle guidance and placement for procedures requiring needle placement in or near specific anatomical locations not easily accessible without such assistance.  . Informed Consent Details: Physician/Practitioner Attestation; Transcribe to consent form and obtain patient signature    Nursing Order: Transcribe to consent form and obtain patient signature. Note: Always confirm laterality of pain with Mr. Rion, before procedure.    Order Specific Question:   Physician/Practitioner attestation of informed consent for procedure/surgical case    Answer:   I, the physician/practitioner, attest that I have discussed with the patient the benefits, risks, side effects, alternatives, likelihood of achieving goals and potential problems during recovery for the procedure that I have provided informed consent.    Order Specific Question:   Procedure    Answer:   Cervical Epidural Steroid Injection (CESI) under fluoroscopic guidance    Order Specific Question:   Physician/Practitioner performing the procedure    Answer:   Ming Kunka A. Laban Emperor MD    Order Specific Question:   Indication/Reason    Answer:   Cervicalgia (Neck Pain)  with or without Cervical Radiculopathy/Radiculitis (Arm/Shoulder Pain, Numbness, and/or weakness), secondary to Cervical and/or Cervicothoracic Degenerative Disc Disease (DDD), with or without Intervertebral Disc Displacement (IVD  . Provide equipment / supplies at bedside    "Epidural Tray" (Disposable  single use) Catheter: NOT required    Standing Status:   Standing    Number of Occurrences:   1    Order Specific Question:   Specify    Answer:   Epidural Tray   Chronic Opioid Analgesic:  Tramadol 50 mg, 2 tabs PO q 6 hrs (400 mg/day of tramadol) (LAST UDS DONE 02/21/2020. Scanned wrong into system) MME/day:40 mg/day.   Medications ordered for procedure: Meds ordered this encounter  Medications  . iohexol (OMNIPAQUE) 180 MG/ML injection 10 mL    Must be Myelogram-compatible. If not available, you may substitute with a water-soluble, non-ionic, hypoallergenic, myelogram-compatible radiological contrast medium.  Marland Kitchen lidocaine (XYLOCAINE) 2 % (with pres) injection 400 mg  . DISCONTD: lactated ringers infusion 1,000 mL  . DISCONTD: midazolam (VERSED) 5 MG/5ML injection 1-2 mg    Make sure Flumazenil is available in the pyxis when using this medication. If oversedation occurs, administer 0.2 mg IV over 15 sec. If after 45 sec no response, administer 0.2 mg again over 1 min; may repeat at 1 min intervals; not to exceed 4 doses (1 mg)  . DISCONTD: fentaNYL (SUBLIMAZE) injection 25-50 mcg    Make sure Narcan is available in the pyxis when using this medication. In the event of respiratory depression (RR< 8/min): Titrate NARCAN (naloxone) in increments of 0.1 to 0.2 mg IV at 2-3 minute intervals, until desired degree of reversal.  . sodium chloride flush (NS) 0.9 % injection 1 mL  . ropivacaine (PF) 2 mg/mL (0.2%) (NAROPIN) injection 1 mL  . dexamethasone (DECADRON) injection 10 mg   Medications administered: Caleb Edwards had no medications administered during this visit.  See the medical  record for exact dosing, route, and time of administration.  Follow-up plan:   Return in about 2 weeks (  around 02/19/2021) for (afternoon VV), on procedure day, (MM).       Interventional Therapies  Risk  Complexity Considerations:   CKD HTN   Planned  Pending:   Pending further evaluation   Under consideration:   Diagnostic right IA hip joint injection  Diagnostic bilateral lumbar facet block  Possible bilateral lumbar facet RFA    Completed:   Diagnostic left cervical ESI x1 (10/28/2020)  Therapeutic left L3-4 LESI x2 (06/13/2018)  Therapeutic left L3 TFESI x1 (03/13/2020)  Therapeutic left L4 TFESI x2 (03/13/2020)  Therapeutic right L3 TFESI x2 (03/13/2020)  Therapeutic right L4 TFESI x3 (03/13/2020)  Referral to neurosurgery Volanda Napoleon, MD) for cervical instability (11/11/2020)    Therapeutic  Palliative (PRN) options:   Palliative left L3-4 LESI #3  Palliative left L3 TFESI #2  Palliative left L4 TFESI #3  Palliative right L3 TFESI #3  Palliative right L4 TFESI #4       Recent Visits Date Type Provider Dept  12/15/20 Office Visit Delano Metz, MD [facility]  11/11/20 Office Visit Delano Metz, MD [facility]  Showing recent visits within past 90 days and meeting all other requirements Today's Visits Date Type Provider Dept  02/05/21 Procedure visit Delano Metz, MD [facility]  Showing today's visits and meeting all other requirements Future Appointments Date Type Provider Dept  02/19/21 Appointment Delano Metz, MD [facility]  03/11/21 Appointment Delano Metz, MD [facility]  Showing future appointments within next 90 days and meeting all other requirements  Disposition: Discharge home  Discharge (Date  Time): 02/05/2021; 1159 hrs.   Primary Care Physician: Duffy, Barnabas Lister, MD Location: [facility] Note by: Oswaldo Done, MD Date:  02/05/2021; Time: 1:08 PM  Disclaimer:  Medicine is not an Visual merchandiser. The only guarantee in medicine is that nothing is guaranteed. It is important to note that the decision to proceed with this intervention was based on the information collected from the patient. The Data and conclusions were drawn from the patient's questionnaire, the interview, and the physical examination. Because the information was provided in large part by the patient, it cannot be guaranteed that it has not been purposely or unconsciously manipulated. Every effort has been made to obtain as much relevant data as possible for this evaluation. It is important to note that the conclusions that lead to this procedure are derived in large part from the available data. Always take into account that the treatment will also be dependent on availability of resources and existing treatment guidelines, considered by other Pain Management Practitioners as being common knowledge and practice, at the time of the intervention. For Medico-Legal purposes, it is also important to point out that variation in procedural techniques and pharmacological choices are the acceptable norm. The indications, contraindications, technique, and results of the above procedure should only be interpreted and judged by a Board-Certified Interventional Pain Specialist with extensive familiarity and expertise in the same exact procedure and technique.

## 2021-02-05 NOTE — Patient Instructions (Signed)

## 2021-02-06 ENCOUNTER — Telehealth: Payer: Self-pay

## 2021-02-06 NOTE — Telephone Encounter (Signed)
Post procedure phone call.  Patient states he is doing pretty good.  

## 2021-02-18 ENCOUNTER — Encounter: Payer: Self-pay | Admitting: Pain Medicine

## 2021-02-18 NOTE — Progress Notes (Signed)
Patient: Caleb Edwards  Service Category: E/M  Provider: Gaspar Cola, MD  DOB: [birth year]  DOS: 02/19/2021  Location: Office  MRN: [id number]  Setting: Ambulatory outpatient  Referring Provider: Cherylann Parr, MD  Type: Established Patient  Specialty: Interventional Pain Management  PCP: Cherylann Parr, MD  Location: Remote location  Delivery: TeleHealth     Virtual Encounter - Pain Management PROVIDER NOTE: Information contained herein reflects review and annotations entered in association with encounter. Interpretation of such information and data should be left to medically-trained personnel. Information provided to patient can be located elsewhere in the medical record under "Patient Instructions". Document created using STT-dictation technology, any transcriptional errors that may result from process are unintentional.    Contact & Pharmacy Preferred: [phone] Home: [phone] (home) Mobile: [phone] (mobile) E-mail: [email or web address]  [place] ([address]), [place] [address] Phone: [phone] Fax: [phone]   Pre-screening  Mr. Jablon offered "in-person" vs "virtual" encounter. He indicated preferring virtual for this encounter.   Reason COVID-19*  Social distancing based on CDC and AMA recommendations.   I contacted Jackelyn Knife on 02/19/2021 via telephone.      I clearly identified myself as Gaspar Cola, MD. I verified that I was speaking with the correct person using two identifiers (Name: TAKOTA CAHALAN, and date of birth: Jun 13, 1951).  Consent I sought verbal advanced consent from Jackelyn Knife for virtual visit interactions. I informed Mr. Labrosse of possible security and privacy concerns, risks, and limitations associated with providing "not-in-person" medical evaluation and management services. I also informed Mr. Uher of the availability of  "in-person" appointments. Finally, I informed him that there would be a charge for the virtual visit and that he could be  personally, fully or partially, financially responsible for it. Mr. Longley expressed understanding and agreed to proceed.   Historic Elements   Mr. TYDARIUS YAWN is a 70 y.o. year old, male patient evaluated today after our last contact on 02/05/2021. Mr. Stidham  has a past medical history of Back pain, Blood transfusion without reported diagnosis (12/2015), Diverticulitis, Hypertension, and Ulcer, stomach peptic, chronic. He also  has a past surgical history that includes Esophagogastroduodenoscopy (egd) with propofol (N/A, 01/03/2016); Colonoscopy with propofol (N/A, 08/02/2017); Esophagogastroduodenoscopy (egd) with propofol (08/02/2017); Esophagogastroduodenoscopy (egd) with propofol (N/A, 02/05/2018); Colonoscopy with propofol (N/A, 02/05/2018); Esophagogastroduodenoscopy (egd) with propofol (N/A, 03/30/2018); Esophagogastroduodenoscopy (egd) with propofol (N/A, 08/09/2019); Colonoscopy with propofol (N/A, 08/09/2019); Givens capsule study (N/A, 04/24/2020); and Esophagogastroduodenoscopy (egd) with propofol (N/A, 04/24/2020). Mr. Leventhal has a current medication list which includes the following prescription(s): amlodipine, atorvastatin, chlorthalidone, ferrous sulfate, lisinopril, pantoprazole, polyethylene glycol, sildenafil, tramadol, and trazodone. He  reports that he has quit smoking. He has never used smokeless tobacco. He reports current alcohol use of about 1.0 standard drink of alcohol per week. He reports that he does not use drugs. Mr. Saiz has No Known Allergies.   HPI  Today, he is being contacted for a post-procedure assessment.  The patient refers having attained some benefit from the second left-sided cervical epidural steroid injection.  On 11/11/2020 I had referred him to Dr. Elayne Guerin for evaluation of his cervical instability.  Unfortunately, the patient did not  keep that appointment and although I told him on the last visit to call them and reschedule, today he refers that he forgot to do that.  Today I will get my staff to call the  [facility] neurosurgery department to see if they can provide him with an appointment to see one of their neurosurgeons.  Post-Procedure Evaluation  Procedure (02/05/2021): Therapeutic left C7-T1 cervical ESI #2 under fluoroscopic guidance, no sedation Pre-procedure pain level: 8/10 Post-procedure: 0/10 (100% relief)  Sedation: None.  Effectiveness during initial hour after procedure(Ultra-Short Term Relief): 100 %.  Local anesthetic used: Long-acting (4-6 hours) Effectiveness: Defined as any analgesic benefit obtained secondary to the administration of local anesthetics. This carries significant diagnostic value as to the etiological location, or anatomical origin, of the pain. Duration of benefit is expected to coincide with the duration of the local anesthetic used.  Effectiveness during initial 4-6 hours after procedure(Short-Term Relief): 100 %.  Long-term benefit: Defined as any relief past the pharmacologic duration of the local anesthetics.  Effectiveness past the initial 6 hours after procedure(Long-Term Relief): 70 % (lsting 8-10 days).  Current benefits: Defined as benefit that persist at this time.   Analgesia:  The patient refers still getting benefit from the cervical epidural steroid injection where he feels that he still has at least a 50% ongoing relief of the pain. Function: Mr. Guymon reports improvement in function ROM: Mr. Sellen reports improvement in ROM  Pharmacotherapy Assessment  Analgesic: Tramadol 50 mg, 2 tabs PO q 6 hrs (400 mg/day of tramadol) (LAST UDS DONE 02/21/2020. Scanned wrong into system) MME/day:40 mg/day.   Monitoring: [place] PMP: PDMP reviewed during this encounter.       Pharmacotherapy: No side-effects or adverse reactions reported. Compliance: No problems  identified. Effectiveness: Clinically acceptable. Plan: Refer to "POC".  UDS:  Summary  Date Value Ref Range Status  03/24/2017 FINAL  Final    Comment:    ==================================================================== TOXASSURE SELECT 13 (MW) ==================================================================== Test                             Result       Flag       Units Drug Present   Tramadol                       PRESENT   O-Desmethyltramadol            PRESENT   N-Desmethyltramadol            PRESENT    Source of tramadol is a prescription medication.    O-desmethyltramadol and N-desmethyltramadol are expected    metabolites of tramadol. ==================================================================== Test                      Result    Flag   Units      Ref Range   Creatinine              125              mg/dL      >=20 ==================================================================== Declared Medications:  Medication list was not provided. ==================================================================== For clinical consultation, please call [phone]. ====================================================================     Laboratory Chemistry Profile   Renal Lab Results  Component Value Date   BUN 18 02/21/2020   CREATININE 1.24 02/21/2020   GFRAA >60 02/21/2020   GFRNONAA 60 (L) 02/21/2020     Hepatic Lab Results  Component Value Date   AST 23 08/09/2019   ALT 15 08/09/2019   ALBUMIN 3.8 08/09/2019   ALKPHOS 53 08/09/2019   LIPASE 17 08/08/2019     Electrolytes Lab Results  Component Value Date   NA 138 08/09/2019   K 3.9 08/09/2019   CL 106 08/09/2019   CALCIUM 8.7 (L) 08/09/2019   MG 1.9 01/04/2016     Bone Lab Results  Component Value Date   VD25OH 24.0 (L) 12/26/2015     Inflammation (CRP: Acute Phase) (ESR: Chronic Phase) Lab Results  Component Value Date   CRP 0.6 12/26/2015   ESRSEDRATE 3 12/26/2015        Note: Above Lab results reviewed.  Imaging  DG PAIN CLINIC C-ARM 1-60 MIN NO REPORT Fluoro was used, but no Radiologist interpretation will be provided.  Please refer to "NOTES" tab for provider progress note.  Assessment  The primary encounter diagnosis was Cervicalgia. 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  Plan of Care  Problem-specific:  No problem-specific Assessment & Plan notes found for this encounter.  Mr. MIKLOS BIDINGER has a current medication list which includes the following long-term medication(s): atorvastatin, chlorthalidone, lisinopril, pantoprazole, tramadol, and trazodone.  Pharmacotherapy (Medications Ordered): No orders of the defined types were placed in this encounter.  Orders:  No orders of the defined types were placed in this encounter.  Follow-up plan:   Return for scheduled encounter.      Interventional Therapies  Risk  Complexity Considerations:   CKD HTN   Planned  Pending:   Pending further evaluation   Under consideration:   Diagnostic right IA hip joint injection  Diagnostic bilateral lumbar facet block  Possible bilateral lumbar facet RFA    Completed:   Diagnostic left cervical ESI x1 (10/28/2020)  Therapeutic left L3-4 LESI x2 (06/13/2018)  Therapeutic left L3 TFESI x1 (03/13/2020)  Therapeutic left L4 TFESI x2 (03/13/2020)  Therapeutic right L3 TFESI x2 (03/13/2020)  Therapeutic right L4 TFESI x3 (03/13/2020)  Referral to neurosurgery Elayne Guerin, MD) for cervical instability (11/11/2020)    Therapeutic  Palliative (PRN) options:   Palliative left L3-4 LESI #3  Palliative left L3 TFESI #2  Palliative left L4 TFESI #3  Palliative right L3 TFESI #3  Palliative  right L4 TFESI #4     Recent Visits Date Type Provider Dept  02/05/21 Procedure visit Milinda Pointer, MD [facility]  12/15/20 Office Visit Milinda Pointer, MD [facility]  Showing recent visits within past 90 days and meeting all other requirements Today's Visits Date Type Provider Dept  02/19/21 Telemedicine Milinda Pointer, MD [facility]  Showing today's visits and meeting all other requirements Future Appointments Date Type Provider Dept  03/11/21 Appointment Milinda Pointer, MD [facility]  Showing future appointments within next 90 days and meeting all other requirements  I discussed the assessment and treatment plan with the patient. The patient was provided an opportunity to ask questions and all were answered. The patient agreed with the plan and demonstrated an understanding of the instructions.  Patient advised to call back or seek an in-person evaluation if the symptoms or condition worsens.  Duration of encounter: 12 minutes.  Note by: Gaspar Cola, MD Date: 02/19/2021; Time: 4:54 PM

## 2021-02-19 ENCOUNTER — Ambulatory Visit: Payer: Medicare HMO | Attending: Pain Medicine | Admitting: Pain Medicine

## 2021-02-19 ENCOUNTER — Other Ambulatory Visit: Payer: Self-pay

## 2021-02-19 ENCOUNTER — Encounter: Payer: Medicare HMO | Admitting: Pain Medicine

## 2021-02-19 DIAGNOSIS — M542 Cervicalgia: Secondary | ICD-10-CM

## 2021-02-19 DIAGNOSIS — M532X2 Spinal instabilities, cervical region: Secondary | ICD-10-CM

## 2021-02-19 DIAGNOSIS — M5030 Other cervical disc degeneration, unspecified cervical region: Secondary | ICD-10-CM | POA: Diagnosis not present

## 2021-02-19 DIAGNOSIS — M4802 Spinal stenosis, cervical region: Secondary | ICD-10-CM

## 2021-02-19 DIAGNOSIS — M5412 Radiculopathy, cervical region: Secondary | ICD-10-CM

## 2021-02-19 DIAGNOSIS — G894 Chronic pain syndrome: Secondary | ICD-10-CM

## 2021-02-19 DIAGNOSIS — M4312 Spondylolisthesis, cervical region: Secondary | ICD-10-CM

## 2021-02-26 NOTE — Progress Notes (Signed)
Patient is scheduled for Dr. Myer Haff June 24th at 1pm I called and left msg for patient with the appt and asked him to call me for confirmation.  I will try to call him again.

## 2021-03-04 ENCOUNTER — Telehealth: Payer: Self-pay | Admitting: Gastroenterology

## 2021-03-04 NOTE — Telephone Encounter (Signed)
Patient needs refill  pantoprazole (PROTONIX) 40 MG tablet  [facility].  Patient has appointment 04/16/21

## 2021-03-06 ENCOUNTER — Other Ambulatory Visit: Payer: Self-pay

## 2021-03-06 MED ORDER — PANTOPRAZOLE SODIUM 40 MG PO TBEC: 40.0000 mg | DELAYED_RELEASE_TABLET | Freq: Two times a day (BID) | ORAL | 0 refills | Status: DC

## 2021-03-06 NOTE — Telephone Encounter (Signed)
Refill has been sent to pharmacy. Patient will need to keep appointment for future refills.

## 2021-03-10 NOTE — Progress Notes (Signed)
PROVIDER NOTE: Information contained herein reflects review and annotations entered in association with encounter. Interpretation of such information and data should be left to medically-trained personnel. Information provided to patient can be located elsewhere in the medical record under "Patient Instructions". Document created using STT-dictation technology, any transcriptional errors that may result from process are unintentional.    Patient: Caleb Edwards  Service Category: E/M  Provider: Gaspar Cola, MD  DOB: [birth year]  DOS: 03/11/2021  Specialty: Interventional Pain Management  MRN: [id number]  Setting: Ambulatory outpatient  PCP: Cherylann Parr, MD  Type: Established Patient    Referring Provider: Cherylann Parr, MD  Location: Office  Delivery: Face-to-face     HPI  [name], a 70 y.o. year old male, is here today because of his Chronic pain syndrome [G89.4]. [name] primary complain today is Back Pain (Right low) and Neck Pain (right) Last encounter: My last encounter with him was on 02/05/2021. Pertinent problems: 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 Pain Assessment: Severity of Chronic pain is reported as a 7 /10. Location: Back Lower,Right/low back radiates into the back of right leg to knee, right neck radiates into right shoulder. Onset: More than a month ago. Quality: Aching. Timing: Intermittent. Modifying factor(s): medications. Vitals:  height is 6' (1.829 m) and weight is 175 lb (79.4 kg). His temperature is 96.8 F (36 C) (abnormal). His blood pressure is 136/88 and his pulse is 72. His respiration is 18 and oxygen saturation is 100%.   Reason for encounter: medication management.   The patient indicates doing well with the current medication regimen. No adverse reactions or side effects reported to the medications.   RTCB: 06/19/2021  Pharmacotherapy Assessment   Analgesic: Tramadol 50 mg, 2 tabs PO q 6 hrs (400 mg/day of tramadol) (LAST UDS DONE 02/21/2020. Scanned wrong into system) MME/day:40 mg/day.   Monitoring: [place] PMP: PDMP reviewed during this encounter.       Pharmacotherapy: No side-effects or adverse reactions reported. Compliance: No problems identified. Effectiveness: Clinically acceptable.  TDewayne Shorter RN  03/11/2021 11:49 AM  Signed Nursing Pain Medication Assessment:  Safety precautions to be maintained throughout the outpatient stay will include: orient to surroundings, keep bed in low position, maintain call bell within reach at all times, provide assistance with transfer out of bed and ambulation.  Medication Inspection Compliance: Mr. [name] not comply with our request to bring his pills to be counted. He was reminded that bringing the medication bottles, even when empty, is a requirement.  Medication: None brought in. Pill/Patch Count: None available to be counted. Bottle Appearance: No container available. Did not bring bottle(s) to appointment. Filled Date: N/A Last Medication intake:  Today  Reminded to  bring to app appointments  UDS:  Summary  Date Value Ref Range Status  03/24/2017 FINAL  Final    Comment:    ==================================================================== TOXASSURE SELECT 13 (MW) ==================================================================== Test                             Result       Flag       Units Drug Present   Tramadol                       PRESENT   O-Desmethyltramadol            PRESENT   N-Desmethyltramadol            PRESENT    Source of tramadol is a prescription medication.    O-desmethyltramadol and N-desmethyltramadol are expected    metabolites of tramadol. ==================================================================== Test                      Result    Flag   Units      Ref Range   Creatinine              125              mg/dL      >=20 ==================================================================== Declared Medications:  Medication list was not provided. ==================================================================== For clinical consultation, please call [phone]. ====================================================================      ROS  Constitutional: Denies any fever or chills Gastrointestinal: No reported hemesis, hematochezia, vomiting, or acute GI distress Musculoskeletal: Denies any acute onset joint swelling, redness, loss of ROM, or weakness Neurological: No reported episodes of acute onset apraxia, aphasia, dysarthria, agnosia, amnesia, paralysis, loss of coordination, or loss of consciousness  Medication Review  amLODipine, atorvastatin, chlorthalidone, ferrous sulfate, lisinopril, pantoprazole, polyethylene glycol, sildenafil, traMADol, and traZODone  History Review  Allergy: [name] has No Known Allergies. Drug: [name]  reports no history of drug use. Alcohol:  reports current alcohol use of about 1.0 standard drink of alcohol per week. Tobacco:  reports that he has quit  smoking. He has never used smokeless tobacco. Social: [name]  reports that he has quit smoking. He has never used smokeless tobacco. He reports current alcohol use of about 1.0 standard drink of alcohol per week. He reports that he does not use drugs. Medical:  has a past medical history of Back pain, Blood transfusion without reported diagnosis (12/2015), Diverticulitis, Hypertension, and Ulcer, stomach peptic, chronic. Surgical: Mr. Taillon  has a past surgical history that includes Esophagogastroduodenoscopy (egd) with propofol (N/A, 01/03/2016); Colonoscopy with propofol (N/A, 08/02/2017); Esophagogastroduodenoscopy (egd) with propofol (08/02/2017); Esophagogastroduodenoscopy (egd) with propofol (N/A, 02/05/2018); Colonoscopy with propofol (N/A, 02/05/2018); Esophagogastroduodenoscopy (egd) with propofol (N/A, 03/30/2018); Esophagogastroduodenoscopy (egd) with propofol (N/A, 08/09/2019); Colonoscopy with propofol (N/A, 08/09/2019); Givens capsule study (N/A, 04/24/2020); and Esophagogastroduodenoscopy (egd) with propofol (N/A, 04/24/2020). Family: family history includes Diabetes in his mother; Heart disease in his father; Prostate cancer in his brother.  Laboratory Chemistry Profile   Renal Lab Results  Component Value Date   BUN 18 02/21/2020   CREATININE 1.24 02/21/2020   GFRAA >60 02/21/2020   GFRNONAA 60 (L) 02/21/2020     Hepatic Lab Results  Component Value Date   AST 23 08/09/2019   ALT 15 08/09/2019   ALBUMIN 3.8 08/09/2019   ALKPHOS 53 08/09/2019   LIPASE 17 08/08/2019     Electrolytes Lab Results  Component Value Date   NA  138 08/09/2019   K 3.9 08/09/2019   CL 106 08/09/2019   CALCIUM 8.7 (L) 08/09/2019   MG 1.9 01/04/2016     Bone Lab Results  Component Value Date   VD25OH 24.0 (L) 12/26/2015     Inflammation (CRP: Acute Phase) (ESR: Chronic Phase) Lab Results  Component Value Date   CRP 0.6 12/26/2015   ESRSEDRATE 3 12/26/2015       Note: Above Lab results  reviewed.  Recent Imaging Review  DG PAIN CLINIC C-ARM 1-60 MIN NO REPORT Fluoro was used, but no Radiologist interpretation will be provided.  Please refer to "NOTES" tab for provider progress note. Note: Reviewed        Physical Exam  General appearance: Well nourished, well developed, and well hydrated. In no apparent acute distress Mental status: Alert, oriented x 3 (person, place, & time)       Respiratory: No evidence of acute respiratory distress Eyes: PERLA Vitals: BP 136/88   Pulse 72   Temp (!) 96.8 F (36 C)   Resp 18   Ht 6' (1.829 m)   Wt 175 lb (79.4 kg)   SpO2 100%   BMI 23.73 kg/m  BMI: Estimated body mass index is 23.73 kg/m as calculated from the following:   Height as of this encounter: 6' (1.829 m).   Weight as of this encounter: 175 lb (79.4 kg). Ideal: Ideal body weight: 77.6 kg (171 lb 1.2 oz) Adjusted ideal body weight: 78.3 kg (172 lb 10.3 oz)  Assessment   Status Diagnosis  Controlled Controlled Controlled 1. Chronic pain syndrome   2. Chronic low back pain (1ry area of Pain) (Bilateral) (R>L) w/ sciatica (Left)   3. 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 Chronic lower extremity pain (2ry area of Pain) (Left)   5. Cervicalgia   6. 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 Pharmacologic therapy   8. Chronic use of opiate for therapeutic purpose   9. Uncomplicated opioid dependence ([place]      Updated Problems: Problem  Chronic low back pain (1ry area of Pain) (Bilateral) (R>L) w/ sciatica (Left)  Chronic lower extremity pain (2ry area of Pain) (Left)  Chronic Use of Opiate for Therapeutic Purpose  Lower GI Bleed  Duodenal Stenosis  Absolute Anemia  Iron Deficiency Anemia Due to Chronic Blood Loss    Plan of Care  Problem-specific:  No problem-specific Assessment & Plan notes found for this encounter.  Mr. WPARMINDER CUPPLEShas a current medication list which includes the following long-term  medication(s): atorvastatin, chlorthalidone, lisinopril, pantoprazole, [START ON 03/21/2021] tramadol, and trazodone.  Pharmacotherapy (Medications Ordered): Meds ordered this encounter  Medications  . traMADol (ULTRAM) 50 MG tablet    Sig: Take 2 tablets (100 mg total) by mouth every 6 (six) hours as needed for severe pain. Each refill must last 30 days.    Dispense:  240 tablet    Refill:  2    Not a duplicate. Do NOT delete! Dispense 1 day early if closed on refill date. Avoid benzodiazepines within 8 hours of opioids. Do not send refill requests.   Orders:  Orders Placed This Encounter  Procedures  . ToxASSURE Select 13 (MW), Urine    Volume: 30 ml(s). Minimum 3 ml of urine is needed. Document temperature of fresh sample. Indications: Long term (current) use of opiate analgesic ((K59.935    Order Specific Question:   Release to patient  Answer:   Immediate   Follow-up plan:   Return in about 3 months (around 06/19/2021) for evaluation day (F2F) (MM).      Interventional Therapies  Risk  Complexity Considerations:   CKD HTN   Planned  Pending:   Pending further evaluation   Under consideration:   Diagnostic right IA hip joint injection  Diagnostic bilateral lumbar facet block  Possible bilateral lumbar facet RFA    Completed:   Diagnostic left cervical ESI x1 (10/28/2020)  Therapeutic left L3-4 LESI x2 (06/13/2018)  Therapeutic left L3 TFESI x1 (03/13/2020)  Therapeutic left L4 TFESI x2 (03/13/2020)  Therapeutic right L3 TFESI x2 (03/13/2020)  Therapeutic right L4 TFESI x3 (03/13/2020)  Referral to neurosurgery Elayne Guerin, MD) for cervical instability (11/11/2020)    Therapeutic  Palliative (PRN) options:   Palliative left L3-4 LESI #3  Palliative left L3 TFESI #2  Palliative left L4 TFESI #3  Palliative right L3 TFESI #3  Palliative right L4 TFESI #4      Recent Visits Date Type Provider Dept  02/19/21 Telemedicine Milinda Pointer, MD [facility]  02/05/21 Procedure visit Milinda Pointer, MD [facility]  12/15/20 Office Visit Milinda Pointer, MD [facility]  Showing recent visits within past 90 days and meeting all other requirements Today's Visits Date Type Provider Dept  03/11/21 Office Visit Milinda Pointer, MD [facility]  Showing today's visits and meeting all other requirements Future Appointments No visits were found meeting these conditions. Showing future appointments within next 90 days and meeting all other requirements  I discussed the assessment and treatment plan with the patient. The patient was provided an opportunity to ask questions and all were answered. The patient agreed with the plan and demonstrated an understanding of the instructions.  Patient advised to call back or seek an in-person evaluation if the symptoms or condition worsens.  Duration of encounter: 30 minutes.  Note by: Gaspar Cola, MD Date: 03/11/2021; Time: 12:00 PM

## 2021-03-11 ENCOUNTER — Ambulatory Visit: Payer: Medicare HMO | Attending: Pain Medicine | Admitting: Pain Medicine

## 2021-03-11 ENCOUNTER — Other Ambulatory Visit: Payer: Self-pay

## 2021-03-11 ENCOUNTER — Encounter: Payer: Self-pay | Admitting: Pain Medicine

## 2021-03-11 VITALS — BP 136/88 | HR 72 | Temp 96.8°F | Resp 18 | Ht 72.0 in | Wt 175.0 lb

## 2021-03-11 DIAGNOSIS — M4310 Spondylolisthesis, site unspecified: Secondary | ICD-10-CM | POA: Diagnosis not present

## 2021-03-11 DIAGNOSIS — G8929 Other chronic pain: Secondary | ICD-10-CM

## 2021-03-11 DIAGNOSIS — M542 Cervicalgia: Secondary | ICD-10-CM

## 2021-03-11 DIAGNOSIS — M5442 Lumbago with sciatica, left side: Secondary | ICD-10-CM | POA: Diagnosis not present

## 2021-03-11 DIAGNOSIS — Z79891 Long term (current) use of opiate analgesic: Secondary | ICD-10-CM | POA: Insufficient documentation

## 2021-03-11 DIAGNOSIS — G894 Chronic pain syndrome: Secondary | ICD-10-CM | POA: Diagnosis not present

## 2021-03-11 DIAGNOSIS — F1120 Opioid dependence, uncomplicated: Secondary | ICD-10-CM

## 2021-03-11 DIAGNOSIS — M79605 Pain in left leg: Secondary | ICD-10-CM | POA: Diagnosis not present

## 2021-03-11 DIAGNOSIS — M4312 Spondylolisthesis, cervical region: Secondary | ICD-10-CM

## 2021-03-11 DIAGNOSIS — Z79899 Other long term (current) drug therapy: Secondary | ICD-10-CM

## 2021-03-11 MED ORDER — TRAMADOL HCL 50 MG PO TABS: 100.0000 mg | ORAL_TABLET | Freq: Four times a day (QID) | ORAL | 2 refills | Status: DC | PRN

## 2021-03-11 NOTE — Patient Instructions (Signed)
____________________________________________________________________________________________  Medication Rules  Purpose: To inform patients, and their family members, of our rules and regulations.  Applies to: All patients receiving prescriptions (written or electronic).  Pharmacy of record: Pharmacy where electronic prescriptions will be sent. If written prescriptions are taken to a different pharmacy, please inform the nursing staff. The pharmacy listed in the electronic medical record should be the one where you would like electronic prescriptions to be sent.  Electronic prescriptions: In compliance with the [place] Strengthen Opioid Misuse Prevention (STOP) Act of 2017 (Session Law 2017-74/H243), effective October 04, 2018, all controlled substances must be electronically prescribed. Calling prescriptions to the pharmacy will cease to exist.  Prescription refills: Only during scheduled appointments. Applies to all prescriptions.  NOTE: The following applies primarily to controlled substances (Opioid* Pain Medications).   Type of encounter (visit): For patients receiving controlled substances, face-to-face visits are required. (Not an option or up to the patient.)  Patient's responsibilities: 1. Pain Pills: Bring all pain pills to every appointment (except for procedure appointments). 2. Pill Bottles: Bring pills in original pharmacy bottle. Always bring the newest bottle. Bring bottle, even if empty. 3. Medication refills: You are responsible for knowing and keeping track of what medications you take and those you need refilled. The day before your appointment: write a list of all prescriptions that need to be refilled. The day of the appointment: give the list to the admitting nurse. Prescriptions will be written only during appointments. No prescriptions will be written on procedure days. If you forget a medication: it will not be "Called in", "Faxed", or "electronically sent".  You will need to get another appointment to get these prescribed. No early refills. Do not call asking to have your prescription filled early. 4. Prescription Accuracy: You are responsible for carefully inspecting your prescriptions before leaving our office. Have the discharge nurse carefully go over each prescription with you, before taking them home. Make sure that your name is accurately spelled, that your address is correct. Check the name and dose of your medication to make sure it is accurate. Check the number of pills, and the written instructions to make sure they are clear and accurate. Make sure that you are given enough medication to last until your next medication refill appointment. 5. Taking Medication: Take medication as prescribed. When it comes to controlled substances, taking less pills or less frequently than prescribed is permitted and encouraged. Never take more pills than instructed. Never take medication more frequently than prescribed.  6. Inform other Doctors: Always inform, all of your healthcare providers, of all the medications you take. 7. Pain Medication from other Providers: You are not allowed to accept any additional pain medication from any other Doctor or Healthcare provider. There are two exceptions to this rule. (see below) In the event that you require additional pain medication, you are responsible for notifying us, as stated below. 8. Cough Medicine: Often these contain an opioid, such as codeine or hydrocodone. Never accept or take cough medicine containing these opioids if you are already taking an opioid* medication. The combination may cause respiratory failure and death. 9. Medication Agreement: You are responsible for carefully reading and following our Medication Agreement. This must be signed before receiving any prescriptions from our practice. Safely store a copy of your signed Agreement. Violations to the Agreement will result in no further prescriptions.  (Additional copies of our Medication Agreement are available upon request.) 10. Laws, Rules, & Regulations: All patients are expected to follow all   Federal and State Laws, Statutes, Rules, & Regulations. Ignorance of the Laws does not constitute a valid excuse.  11. Illegal drugs and Controlled Substances: The use of illegal substances (including, but not limited to marijuana and its derivatives) and/or the illegal use of any controlled substances is strictly prohibited. Violation of this rule may result in the immediate and permanent discontinuation of any and all prescriptions being written by our practice. The use of any illegal substances is prohibited. 12. Adopted CDC guidelines & recommendations: Target dosing levels will be at or below 60 MME/day. Use of benzodiazepines** is not recommended.  Exceptions: There are only two exceptions to the rule of not receiving pain medications from other Healthcare Providers. 1. Exception #1 (Emergencies): In the event of an emergency (i.e.: accident requiring emergency care), you are allowed to receive additional pain medication. However, you are responsible for: As soon as you are able, call our office [phone], at any time of the day or night, and leave a message stating your name, the date and nature of the emergency, and the name and dose of the medication prescribed. In the event that your call is answered by a member of our staff, make sure to document and save the date, time, and the name of the person that took your information.  2. Exception #2 (Planned Surgery): In the event that you are scheduled by another doctor or dentist to have any type of surgery or procedure, you are allowed (for a period no longer than 30 days), to receive additional pain medication, for the acute post-op pain. However, in this case, you are responsible for picking up a copy of our "Post-op Pain Management for Surgeons" handout, and giving it to your surgeon or dentist. This  document is available at our office, and does not require an appointment to obtain it. Simply go to our office during business hours (Monday-Thursday from 8:00 AM to 4:00 PM) (Friday 8:00 AM to 12:00 Noon) or if you have a scheduled appointment with us, prior to your surgery, and ask for it by name. In addition, you are responsible for: calling our office [phone], at any time of the day or night, and leaving a message stating your name, name of your surgeon, type of surgery, and date of procedure or surgery. Failure to comply with your responsibilities may result in termination of therapy involving the controlled substances.  *Opioid medications include: morphine, codeine, oxycodone, oxymorphone, hydrocodone, hydromorphone, meperidine, tramadol, tapentadol, buprenorphine, fentanyl, methadone. **Benzodiazepine medications include: diazepam (Valium), alprazolam (Xanax), clonazepam (Klonopine), lorazepam (Ativan), clorazepate (Tranxene), chlordiazepoxide (Librium), estazolam (Prosom), oxazepam (Serax), temazepam (Restoril), triazolam (Halcion) (Last updated: 09/01/2020) ____________________________________________________________________________________________   ____________________________________________________________________________________________  Medication Recommendations and Reminders  Applies to: All patients receiving prescriptions (written and/or electronic).  Medication Rules & Regulations: These rules and regulations exist for your safety and that of others. They are not flexible and neither are we. Dismissing or ignoring them will be considered "non-compliance" with medication therapy, resulting in complete and irreversible termination of such therapy. (See document titled "Medication Rules" for more details.) In all conscience, because of safety reasons, we cannot continue providing a therapy where the patient does not follow instructions.  Pharmacy of record:   Definition:  This is the pharmacy where your electronic prescriptions will be sent.   We do not endorse any particular pharmacy, however, we have experienced problems with [facility] not securing enough medication supply for the community.  We do not restrict you in your choice of pharmacy. However,   once we write for your prescriptions, we will NOT be re-sending more prescriptions to fix restricted supply problems created by your pharmacy, or your insurance.   The pharmacy listed in the electronic medical record should be the one where you want electronic prescriptions to be sent.  If you choose to change pharmacy, simply notify our nursing staff.  Recommendations:  Keep all of your pain medications in a safe place, under lock and key, even if you live alone. We will NOT replace lost, stolen, or damaged medication.  After you fill your prescription, take 1 week's worth of pills and put them away in a safe place. You should keep a separate, properly labeled bottle for this purpose. The remainder should be kept in the original bottle. Use this as your primary supply, until it runs out. Once it's gone, then you know that you have 1 week's worth of medicine, and it is time to come in for a prescription refill. If you do this correctly, it is unlikely that you will ever run out of medicine.  To make sure that the above recommendation works, it is very important that you make sure your medication refill appointments are scheduled at least 1 week before you run out of medicine. To do this in an effective manner, make sure that you do not leave the office without scheduling your next medication management appointment. Always ask the nursing staff to show you in your prescription , when your medication will be running out. Then arrange for the receptionist to get you a return appointment, at least 7 days before you run out of medicine. Do not wait until you have 1 or 2 pills left, to come in. This is very poor planning and  does not take into consideration that we may need to cancel appointments due to bad weather, sickness, or emergencies affecting our staff.  DO NOT ACCEPT A "Partial Fill": If for any reason your pharmacy does not have enough pills/tablets to completely fill or refill your prescription, do not allow for a "partial fill". The law allows the pharmacy to complete that prescription within 72 hours, without requiring a new prescription. If they do not fill the rest of your prescription within those 72 hours, you will need a separate prescription to fill the remaining amount, which we will NOT provide. If the reason for the partial fill is your insurance, you will need to talk to the pharmacist about payment alternatives for the remaining tablets, but again, DO NOT ACCEPT A PARTIAL FILL, unless you can trust your pharmacist to obtain the remainder of the pills within 72 hours.  Prescription refills and/or changes in medication(s):   Prescription refills, and/or changes in dose or medication, will be conducted only during scheduled medication management appointments. (Applies to both, written and electronic prescriptions.)  No refills on procedure days. No medication will be changed or started on procedure days. No changes, adjustments, and/or refills will be conducted on a procedure day. Doing so will interfere with the diagnostic portion of the procedure.  No phone refills. No medications will be "called into the pharmacy".  No Fax refills.  No weekend refills.  No Holliday refills.  No after hours refills.  Remember:  Business hours are:  Monday to Thursday 8:00 AM to 4:00 PM Provider's Schedule: Aryahna Spagna, MD - Appointments are:  Medication management: Monday and Wednesday 8:00 AM to 4:00 PM Procedure day: Tuesday and Thursday 7:30 AM to 4:00 PM Bilal Lateef, MD - Appointments are:    Medication management: Tuesday and Thursday 8:00 AM to 4:00 PM Procedure day: Monday and Wednesday  7:30 AM to 4:00 PM (Last update: 04/23/2020) ____________________________________________________________________________________________   ____________________________________________________________________________________________  CBD (cannabidiol) WARNING  Applicable to: All individuals currently taking or considering taking CBD (cannabidiol) and, more important, all patients taking opioid analgesic controlled substances (pain medication). (Example: oxycodone; oxymorphone; hydrocodone; hydromorphone; morphine; methadone; tramadol; tapentadol; fentanyl; buprenorphine; butorphanol; dextromethorphan; meperidine; codeine; etc.)  Legal status: CBD remains a Schedule I drug prohibited for any use. CBD is illegal with one exception. In the [place], CBD has a limited Food and Drug Administration (FDA) approval for the treatment of two specific types of epilepsy disorders. Only one CBD product has been approved by the FDA for this purpose: "Epidiolex". FDA is aware that some companies are marketing products containing cannabis and cannabis-derived compounds in ways that violate the Federal Food, Drug and Cosmetic Act (FD&C Act) and that may put the health and safety of consumers at risk. The FDA, a Federal agency, has not enforced the CBD status since 2018.   Legality: Some manufacturers ship CBD products nationally, which is illegal. Often such products are sold online and are therefore available throughout the country. CBD is openly sold in head shops and health food stores in some states where such sales have not been explicitly legalized. Selling unapproved products with unsubstantiated therapeutic claims is not only a violation of the law, but also can put patients at risk, as these products have not been proven to be safe or effective. Federal illegality makes it difficult to conduct research on CBD.  Reference: "FDA Regulation of Cannabis and Cannabis-Derived Products, Including Cannabidiol  (CBD)" - https://www.fda.gov/news-events/public-health-focus/fda-regulation-cannabis-and-cannabis-derived-products-including-cannabidiol-cbd  Warning: CBD is not FDA approved and has not undergo the same manufacturing controls as prescription drugs.  This means that the purity and safety of available CBD may be questionable. Most of the time, despite manufacturer's claims, it is contaminated with THC (delta-9-tetrahydrocannabinol - the chemical in marijuana responsible for the "HIGH").  When this is the case, the THC contaminant will trigger a positive urine drug screen (UDS) test for Marijuana (carboxy-THC). Because a positive UDS for any illicit substance is a violation of our medication agreement, your opioid analgesics (pain medicine) may be permanently discontinued.  MORE ABOUT CBD  General Information: CBD  is a derivative of the Marijuana (cannabis sativa) plant discovered in 1940. It is one of the 113 identified substances found in Marijuana. It accounts for up to 40% of the plant's extract. As of 2018, preliminary clinical studies on CBD included research for the treatment of anxiety, movement disorders, and pain. CBD is available and consumed in multiple forms, including inhalation of smoke or vapor, as an aerosol spray, and by mouth. It may be supplied as an oil containing CBD, capsules, dried cannabis, or as a liquid solution. CBD is thought not to be as psychoactive as THC (delta-9-tetrahydrocannabinol - the chemical in marijuana responsible for the "HIGH"). Studies suggest that CBD may interact with different biological target receptors in the body, including cannabinoid and other neurotransmitter receptors. As of 2018 the mechanism of action for its biological effects has not been determined.  Side-effects  Adverse reactions: Dry mouth, diarrhea, decreased appetite, fatigue, drowsiness, malaise, weakness, sleep disturbances, and others.  Drug interactions: CBC may interact with other  medications such as blood-thinners. (Last update: 05/10/2020) ____________________________________________________________________________________________   ____________________________________________________________________________________________  Drug Holidays (Slow)  What is a "Drug Holiday"? Drug Holiday: is the name given to the period of time during   which a patient stops taking a medication(s) for the purpose of eliminating tolerance to the drug.  Benefits . Improved effectiveness of opioids. . Decreased opioid dose needed to achieve benefits. . Improved pain with lesser dose.  What is tolerance? Tolerance: is the progressive decreased in effectiveness of a drug due to its repetitive use. With repetitive use, the body gets use to the medication and as a consequence, it loses its effectiveness. This is a common problem seen with opioid pain medications. As a result, a larger dose of the drug is needed to achieve the same effect that used to be obtained with a smaller dose.  How long should a "Drug Holiday" last? You should stay off of the pain medicine for at least 14 consecutive days. (2 weeks)  Should I stop the medicine "cold turkey"? No. You should always coordinate with your Pain Specialist so that he/she can provide you with the correct medication dose to make the transition as smoothly as possible.  How do I stop the medicine? Slowly. You will be instructed to decrease the daily amount of pills that you take by one (1) pill every seven (7) days. This is called a "slow downward taper" of your dose. For example: if you normally take four (4) pills per day, you will be asked to drop this dose to three (3) pills per day for seven (7) days, then to two (2) pills per day for seven (7) days, then to one (1) per day for seven (7) days, and at the end of those last seven (7) days, this is when the "Drug Holiday" would start.   Will I have withdrawals? By doing a "slow downward  taper" like this one, it is unlikely that you will experience any significant withdrawal symptoms. Typically, what triggers withdrawals is the sudden stop of a high dose opioid therapy. Withdrawals can usually be avoided by slowly decreasing the dose over a prolonged period of time. If you do not follow these instructions and decide to stop your medication abruptly, withdrawals may be possible.  What are withdrawals? Withdrawals: refers to the wide range of symptoms that occur after stopping or dramatically reducing opiate drugs after heavy and prolonged use. Withdrawal symptoms do not occur to patients that use low dose opioids, or those who take the medication sporadically. Contrary to benzodiazepine (example: Valium, Xanax, etc.) or alcohol withdrawals ("Delirium Tremens"), opioid withdrawals are not lethal. Withdrawals are the physical manifestation of the body getting rid of the excess receptors.  Expected Symptoms Early symptoms of withdrawal may include: . Agitation . Anxiety . Muscle aches . Increased tearing . Insomnia . Runny nose . Sweating . Yawning  Late symptoms of withdrawal may include: . Abdominal cramping . Diarrhea . Dilated pupils . Goose bumps . Nausea . Vomiting  Will I experience withdrawals? Due to the slow nature of the taper, it is very unlikely that you will experience any.  What is a slow taper? Taper: refers to the gradual decrease in dose.  (Last update: 04/23/2020) ____________________________________________________________________________________________     

## 2021-03-11 NOTE — Progress Notes (Signed)
Nursing Pain Medication Assessment:  Safety precautions to be maintained throughout the outpatient stay will include: orient to surroundings, keep bed in low position, maintain call bell within reach at all times, provide assistance with transfer out of bed and ambulation.  Medication Inspection Compliance: Caleb Edwards did not comply with our request to bring his pills to be counted. He was reminded that bringing the medication bottles, even when empty, is a requirement.  Medication: None brought in. Pill/Patch Count: None available to be counted. Bottle Appearance: No container available. Did not bring bottle(s) to appointment. Filled Date: N/A Last Medication intake:  Today  Reminded to bring to app appointments

## 2021-03-18 LAB — TOXASSURE SELECT 13 (MW), URINE

## 2021-04-16 ENCOUNTER — Other Ambulatory Visit: Payer: Self-pay

## 2021-04-16 ENCOUNTER — Encounter: Payer: Self-pay | Admitting: Gastroenterology

## 2021-04-16 ENCOUNTER — Ambulatory Visit (INDEPENDENT_AMBULATORY_CARE_PROVIDER_SITE_OTHER): Payer: Medicare HMO | Admitting: Gastroenterology

## 2021-04-16 VITALS — BP 148/87 | HR 86 | Temp 98.1°F | Ht 72.0 in | Wt 179.8 lb

## 2021-04-16 DIAGNOSIS — K219 Gastro-esophageal reflux disease without esophagitis: Secondary | ICD-10-CM | POA: Diagnosis not present

## 2021-04-16 MED ORDER — PANTOPRAZOLE SODIUM 40 MG PO TBEC: 40.0000 mg | DELAYED_RELEASE_TABLET | Freq: Two times a day (BID) | ORAL | 3 refills | Status: AC

## 2021-04-16 NOTE — Patient Instructions (Signed)
Please give [place] a call in case you have any issues.  Your refills were sent to your pharmacy.

## 2021-04-16 NOTE — Progress Notes (Signed)
Wyline Mood MD, MRCP(U.K) [address]  [address]  [place], [place] [address]  Main: [phone]  Fax: [phone]   Primary Care Physician: Antonieta Loveless, MD  Primary Gastroenterologist:  Dr. Wyline Mood   Chief complaint: Follow-up for iron deficiency anemia   HPI: Caleb Edwards is a 70 y.o. male   Summary of history :  Last seen in the office in March 2021 for rectal bleeding.  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  Diverticular bleed in May 2019.  Diagonal stenosis dilated to 12 mm in June 2019.  He has had recurrent GI bleed subsequently felt to be diverticular.  At that time he also had iron deficiency anemia. 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  In March 2021 was seen at [facility] for rectal bleeding of 6 days duration.  CT angiogram showed active extravasation in the hepatic flexure.  It resolved spontaneously.  He is doing well.  Denies any issues she is only here today for refill for his pantoprazole 40 mg twice daily which he takes for acid reflux he states that he ran out of the medications and he does not take it for a few days feels significant reflux.  Denies any features of gastric outlet obstruction.  Normal bowel movements.  Current Outpatient Medications  Medication Sig Dispense Refill   amLODipine (NORVASC) 10 MG tablet Take 1 tablet by mouth daily.  3   atorvastatin (LIPITOR) 20 MG tablet Take 1 tablet by mouth at bedtime.  3   chlorthalidone (HYGROTON) 50 MG tablet Take 1 tablet by mouth daily.  3   ferrous sulfate 325 (65 FE) MG tablet Take 325 mg by mouth daily with breakfast.     lisinopril (PRINIVIL,ZESTRIL) 40 MG tablet Take 1 tablet by mouth daily.  3   pantoprazole (PROTONIX) 40 MG tablet Take 1 tablet (40 mg total) by mouth 2 (two) times daily before a meal. 60 tablet 0   polyethylene  glycol (MIRALAX / GLYCOLAX) packet Take 17 g by mouth daily. 30 each 0   sildenafil (VIAGRA) 100 MG tablet Take 100 mg by mouth daily as needed for erectile dysfunction.     traMADol (ULTRAM) 50 MG tablet Take 2 tablets (100 mg total) by mouth every 6 (six) hours as needed for severe pain. Each refill must last 30 days. 240 tablet 2   traZODone (DESYREL) 50 MG tablet 50 mg at bedtime as needed. (Patient not taking: Reported on 04/16/2021)     No current facility-administered medications for this visit.    Allergies as of 04/16/2021   (No Known Allergies)    ROS:  General: Negative for anorexia, weight loss, fever, chills, fatigue, weakness. ENT: Negative for hoarseness, difficulty swallowing , nasal congestion. CV: Negative for chest pain, angina, palpitations, dyspnea on exertion, peripheral edema.  Respiratory: Negative for dyspnea at rest, dyspnea on exertion, cough, sputum, wheezing.  GI: See history of present illness. GU:  Negative for dysuria, hematuria, urinary incontinence, urinary frequency, nocturnal urination.  Endo: Negative for unusual weight change.    Physical Examination:   BP (!) 148/87   Pulse 86   Temp 98.1 F (36.7 C) (Oral)   Ht 6' (1.829 m)   Wt 179 lb 12.8 oz (81.6 kg)   BMI 24.39 kg/m   General: Well-nourished, well-developed in no acute distress.  Eyes: No icterus.  Conjunctivae pink. Mouth: Oropharyngeal mucosa moist and pink , no lesions erythema or exudate. Neuro: Alert and oriented x 3.  Grossly intact. Skin: Warm and dry, no jaundice.   Psych: Alert and cooperative, normal mood and affect.   Imaging Studies: No results found.  Assessment and Plan:   Caleb Edwards is a 70 y.o. 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  Here today to obtain refill for his  pantoprazole which he takes for acid reflux.  Counseled him about lifestyle changes for acid reflux we will provide him patient information provided him a refill of his medications for 3 months with 3 refills.  He denies any features of gastric outlet obstruction as he has had a duodenal stenosis dilated in the past.  No evidence of lower GI bleed as he has had normal bowel movements.  Dr Wyline Mood  MD,MRCP [facility]) Follow up in as needed

## 2021-05-31 IMAGING — MR MR CERVICAL SPINE W/O CM
5 series · 38 of 48 positions shown · non-contrast
Comparison: MRI 03/26/2013

CLINICAL DATA: Chronic neck pain

EXAM:
MRI CERVICAL SPINE WITHOUT CONTRAST
TECHNIQUE: Multiplanar, multisequence MR imaging of the cervical spine was
performed. No intravenous contrast was administered.

[Series 5: T2 · sagittal · 3.0mm · 0.62mm/px · 6 of 15 slices shown (1 of 2)]
[im 1/15]
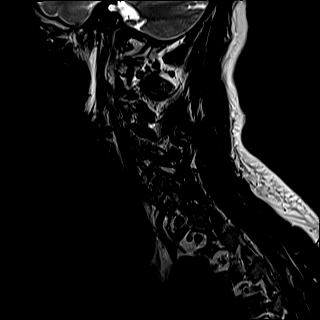
[im 3/15]
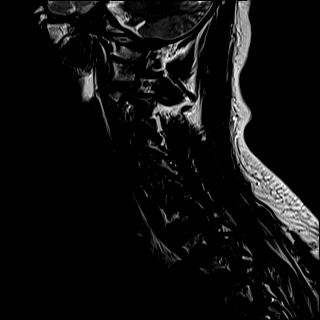
[im 6/15]
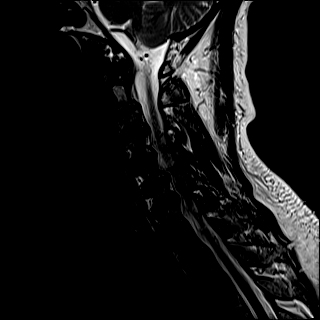
[im 9/15]
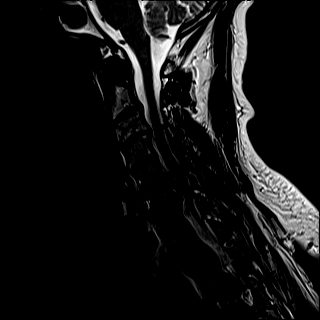
[im 12/15]
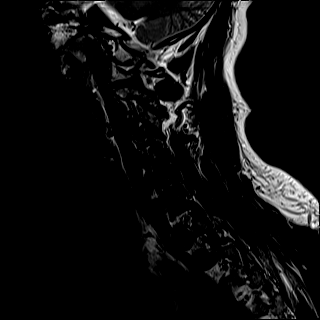
[im 15/15]
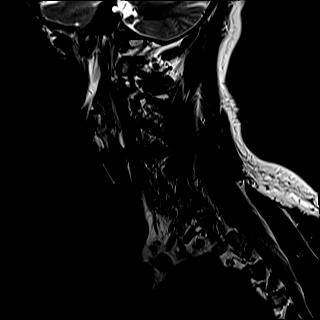

[Series 6: FLAIR · sagittal · 3.0mm · 0.78mm/px · 7 of 15 slices shown]
[im 1/15]
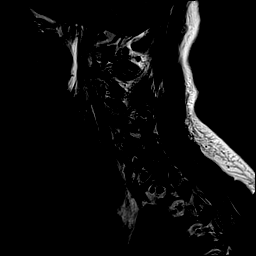
[im 3/15]
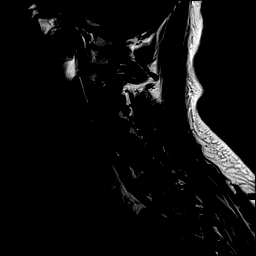
[im 5/15]
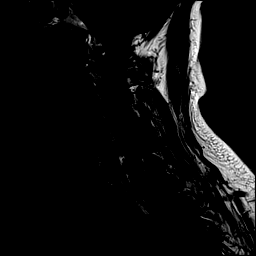
[im 8/15]
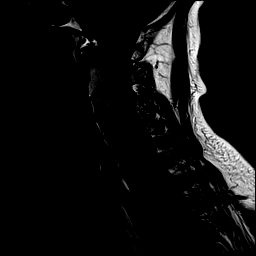
[im 10/15]
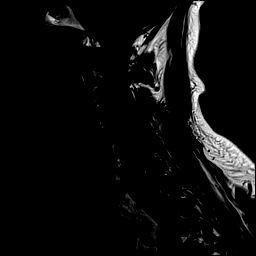
[im 12/15]
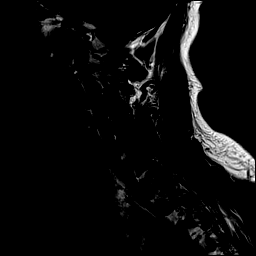
[im 15/15]
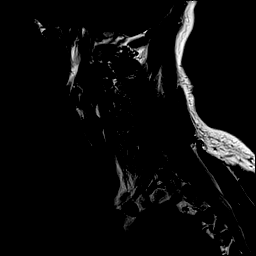

[Series 7: STIR · sagittal · 3.0mm · 0.62mm/px · 7 of 15 slices shown]
[im 1/15]
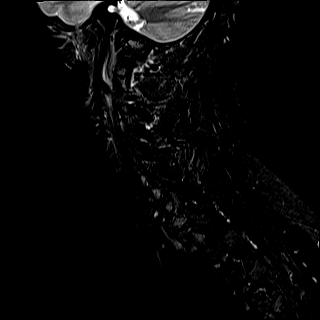
[im 3/15]
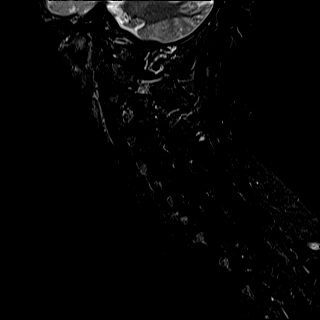
[im 5/15]
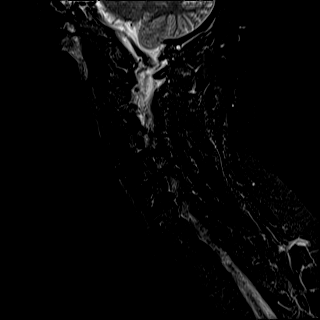
[im 8/15]
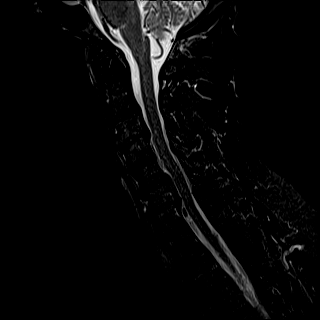
[im 10/15]
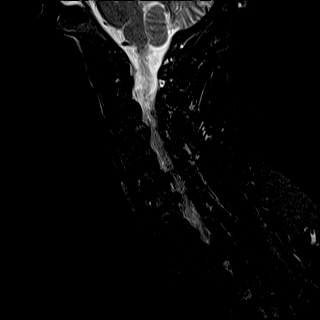
[im 12/15]
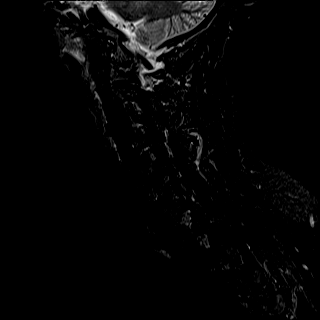
[im 15/15]
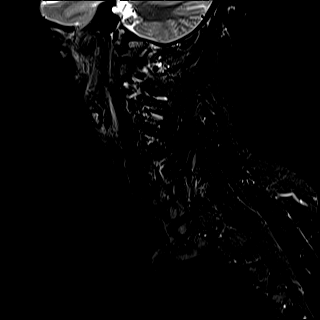

[Series 8: T2 · axial · 3.0mm · 0.70mm/px · z∈[-5,+85]mm · 10 of 29 slices shown (2 of 2)]
[im 1/29]
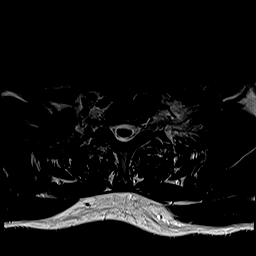
[im 3/29]
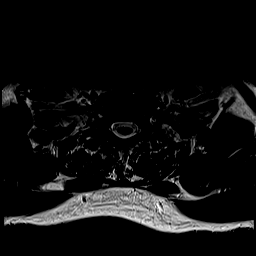
[im 5/29]
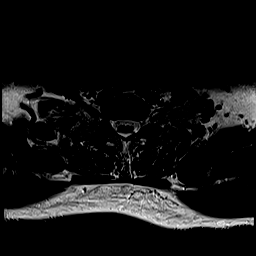
[im 7/29]
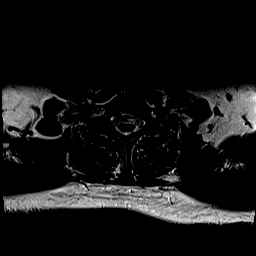
[im 9/29]
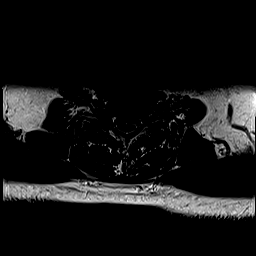
[im 13/29]
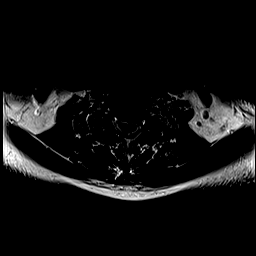
[im 16/29]
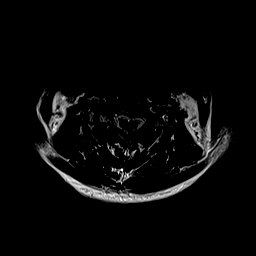
[im 20/29]
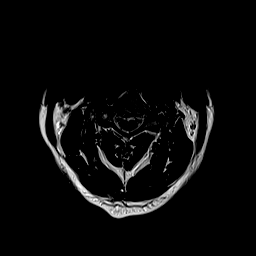
[im 24/29]
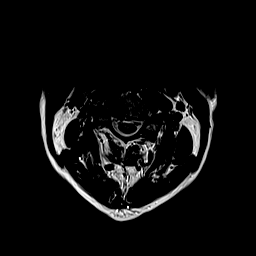
[im 29/29]
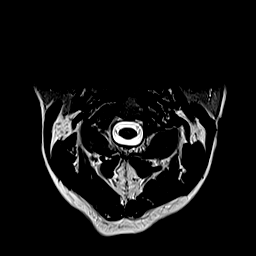

[Series 9: ax mpgr · axial · 3.0mm · 0.35mm/px · z∈[-5,+85]mm · 8 of 29 slices shown]
[im 1/29]
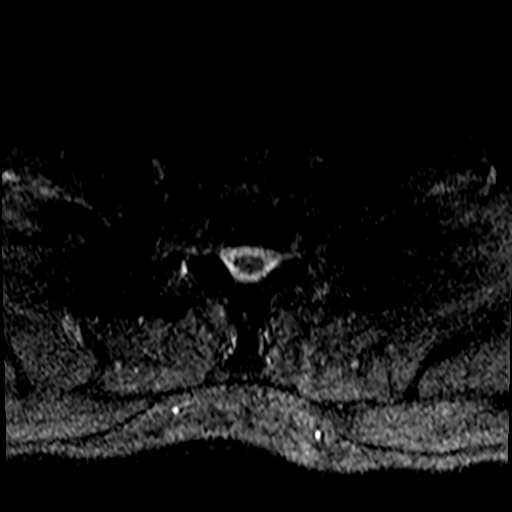
[im 5/29]
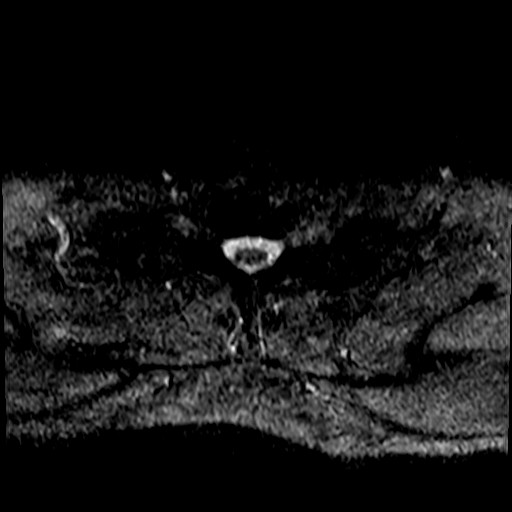
[im 9/29]
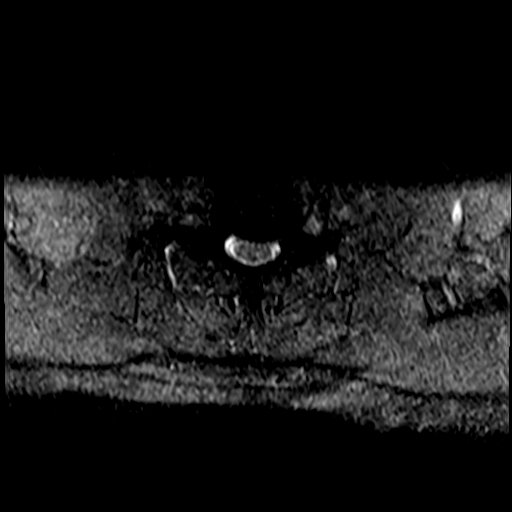
[im 13/29]
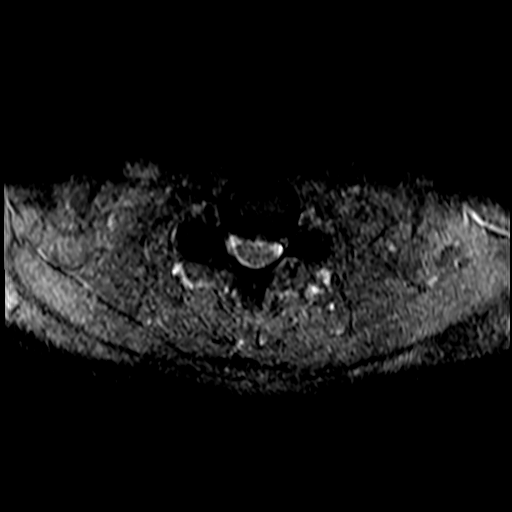
[im 16/29]
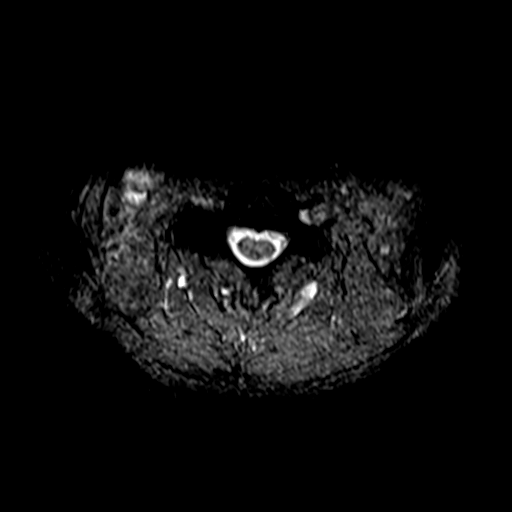
[im 20/29]
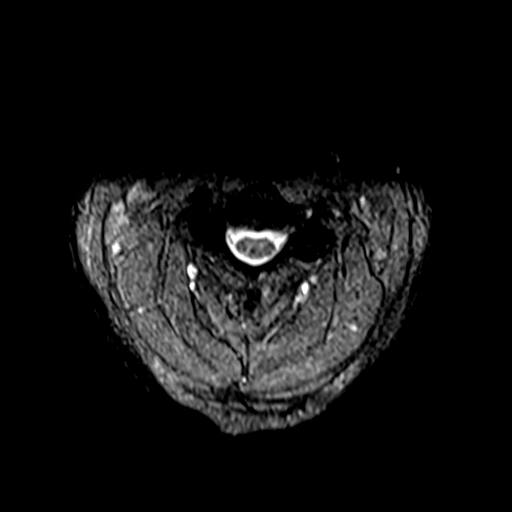
[im 24/29]
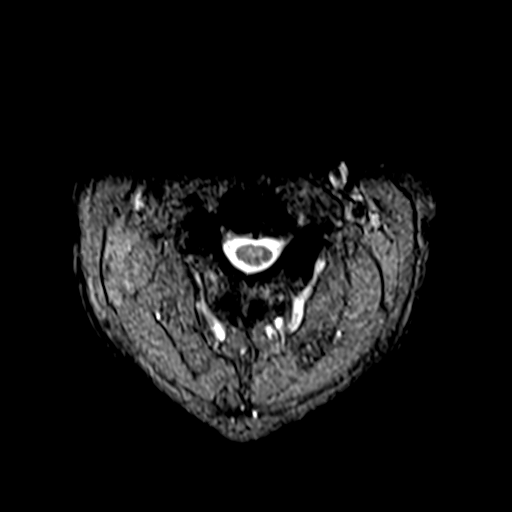
[im 29/29]
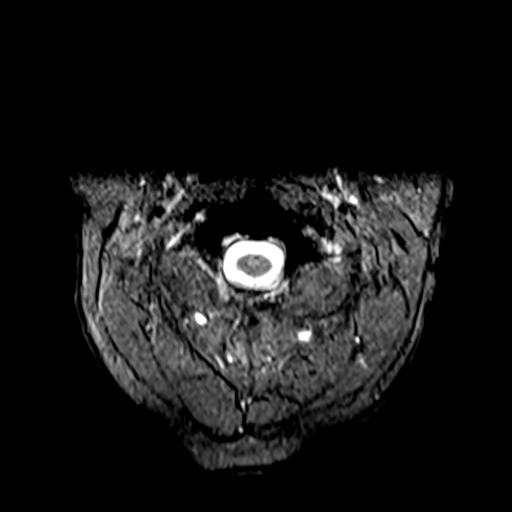

[38 of 48 positions shown; findings below may reference images not displayed]

FINDINGS: Alignment: Straightening of the cervical lordosis with trace
retrolisthesis of C3 on C4 and C6 on C7. Minimal grade 1
anterolisthesis C4 on C5.

Vertebrae: No fracture, evidence of discitis, or bone lesion.

Cord: Normal signal and morphology.

Posterior Fossa, vertebral arteries, paraspinal tissues: Negative.

Disc levels:

C2-C3: Minimal posterior disc osteophyte complex with bilateral
facet arthropathy and uncovertebral spurring. Mild right foraminal
stenosis. No canal stenosis. Unchanged.

C3-C4: Posterior disc osteophyte complex with bilateral facet and
uncovertebral arthropathy. Severe bilateral foraminal stenosis with
mild canal stenosis. Findings slightly progressed from prior.

C4-C5: Posterior disc osteophyte complex with right greater than
left facet arthropathy. Mild-to-moderate right and mild left
foraminal stenosis. No canal stenosis. Unchanged.

C5-C6: Posterior disc osteophyte complex with bilateral facet and
uncovertebral arthropathy. Findings result in moderate canal
stenosis with moderate to severe bilateral foraminal stenosis.
Unchanged.

C6-C7: Left paracentral disc osteophyte complex and uncovertebral
spurring result in moderate canal stenosis with moderate left and
mild right foraminal stenosis. Unchanged.

C7-T1: No significant disc protrusion, foraminal stenosis, or canal
stenosis.
IMPRESSION: 1. Multilevel cervical spondylosis of the cervical spine, with
slight interval progression from the previous MRI.
2. Moderate canal stenosis with moderate to severe bilateral
foramina stenosis at C5-6.
3. Moderate canal stenosis of C6-7.
4. Severe bilateral foraminal stenosis at C3-4.

## 2021-06-15 ENCOUNTER — Encounter: Payer: Medicare HMO | Admitting: Pain Medicine

## 2021-06-15 NOTE — Progress Notes (Signed)
PROVIDER NOTE: Information contained herein reflects review and annotations entered in association with encounter. Interpretation of such information and data should be left to medically-trained personnel. Information provided to patient can be located elsewhere in the medical record under "Patient Instructions". Document created using STT-dictation technology, any transcriptional errors that may result from process are unintentional.    Patient: Caleb Edwards  Service Category: E/M  Provider: Gaspar Cola, MD  DOB: 1951/06/21  DOS: 06/16/2021  Specialty: Interventional Pain Management  MRN: [id number]  Setting: Ambulatory outpatient  PCP: Cherylann Parr, MD  Type: Established Patient    Referring Provider: Cherylann Parr, MD  Location: Office  Delivery: Face-to-face     HPI  [name], a 70 y.o. year old male, is here today because of his Acute exacerbation of chronic low back pain [M54.50, G89.29]. [name] primary complain today is No chief complaint on file. Last encounter: My last encounter with him was on 03/11/2021. Pertinent problems: 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) (L>R); Chronic neck pain; Cervical spondylosis; Chronic pain syndrome; Abdominal pain; Grade 1 Anterolisthesis (7-39m) of L5 over S1; DDD (degenerative disc disease), lumbosacral; Lumbar facet arthropathy; Chronic hip pain (2ry area of Pain) (Bilateral) (R>L); Cervical radiculopathy (Left); Cervicalgia; DDD (degenerative disc disease), cervical; Abnormal x-ray of cervical spine; 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 Pain Assessment: Severity of Chronic pain is reported as a 9 /10. Location: Back Lower, Left, Right/into hps and legs to approx the back of the knee.  patient had a fall about 3 weeks ago and back pain has been worse.. Onset: More than a month ago. Quality: Discomfort, Constant, Throbbing. Timing: Constant. Modifying factor(s): medications are not helping so much since the fall.. Vitals:  height is 6' (1.829 m) and weight is 179 lb (81.2 kg). His temporal temperature is 97.3 F (36.3 C) (abnormal). His blood pressure is 127/76 and his pulse is 73. His respiration is 16 and oxygen saturation is 100%.   Reason for encounter: medication management.   The patient indicates doing well with the current medication regimen. No adverse reactions or side effects reported to the medications.   The patient indicates that approximately 3 weeks ago he had a fall where he landed on the right side of his lower back.  He indicates having immediately began to experience pain in the lower back but denies having gone to the emergency room or urgent care for any evaluation or x-rays.  Currently his primary area of pain is that of the lower back, bilaterally, with the right side being worse than the left.  He denies any prior surgeries or recent evaluation.  He also denies any x-rays in the last 2 years or physical therapy, also in the last 2 years.  The patient's secondary area pain is that of the hips, bilaterally, with the right being worse than the left.  He denies any prior hip surgeries or recent x-rays.  His third area pain is that of the lower extremities, bilaterally, with the right being worse than the left.  In the case of the right lower extremity the pain after the fall initially went all the way down into his big toe (L5) and to the lateral aspect of  the foot (S1).  However, since then the pain has been getting better and currently it only goes down to the back of his knee through the back of the leg.  In the case of the left lower extremity the pain also goes down to the back of the knee but it never went below that area.  Today's physical exam demonstrated the patient to be able to toe walk and heel walk without any problems.  However, hyperextension on rotation of the lumbar spine showed decreased range of motion with reproduction of his low back pain ipsilateral to the side that he was turning into on his provocative hyperextension and rotation maneuver of the lumbar spine.  Based on today's evaluation, I will be ordering some x-rays of the lumbar spine and I will schedule him to return for a diagnostic bilateral lumbar facet block under fluoroscopic guidance.  The patient indicates wanting to have the procedure done without sedation.  RTCB: 12/16/2021   Pharmacotherapy Assessment  Analgesic: Tramadol 50 mg, 2 tabs PO q 6 hrs (400 mg/day of tramadol) (LAST UDS DONE 02/21/2020. Scanned wrong into system) MME/day: 40 mg/day.   Monitoring: [place] PMP: PDMP reviewed during this encounter.       Pharmacotherapy: No side-effects or adverse reactions reported. Compliance: No problems identified. Effectiveness: Clinically acceptable.  Janett Billow, RN  06/16/2021  1:39 PM  Sign when Signing Visit Nursing Pain Medication Assessment:  Safety precautions to be maintained throughout the outpatient stay will include: orient to surroundings, keep bed in low position, maintain call bell within reach at all times, provide assistance with transfer out of bed and ambulation.  Medication Inspection Compliance: Pill count conducted under aseptic conditions, in front of the patient. Neither the pills nor the bottle was removed from the patient's sight at any time. Once count was completed pills were immediately returned to the patient in their original  bottle.  Medication: Tramadol (Ultram) Pill/Patch Count:  140 of 240 pills remain Pill/Patch Appearance: Markings consistent with prescribed medication Bottle Appearance: Standard pharmacy container. Clearly labeled. Filled Date: 08 / 29 / 2022 Last Medication intake:  Today    UDS:  Summary  Date Value Ref Range Status  03/11/2021 Note  Final    Comment:    ==================================================================== [employer] Select 13 (MW) ==================================================================== Test                             Result       Flag       Units  Drug Present and Declared for Prescription Verification   Tramadol                       >3472        EXPECTED   ng/mg creat   O-Desmethyltramadol            >3472        EXPECTED   ng/mg creat   N-Desmethyltramadol            1831         EXPECTED   ng/mg creat    Source of tramadol is a prescription medication. O-desmethyltramadol    and N-desmethyltramadol are expected metabolites of tramadol.  ==================================================================== Test  Result    Flag   Units      Ref Range   Creatinine              144              mg/dL      >=20 ==================================================================== Declared Medications:  The flagging and interpretation on this report are based on the  following declared medications.  Unexpected results may arise from  inaccuracies in the declared medications.   **Note: The testing scope of this panel includes these medications:   Tramadol (Ultram)   **Note: The testing scope of this panel does not include the  following reported medications:   Amlodipine (Norvasc)  Atorvastatin (Lipitor)  Chlorthalidone (Hygroton)  Iron  Lisinopril (Zestril)  Pantoprazole (Protonix)  Polyethylene Glycol (MiraLAX)  Sildenafil (Viagra)  Trazodone  (Desyrel) ==================================================================== For clinical consultation, please call [phone]. ====================================================================      ROS  Constitutional: Denies any fever or chills Gastrointestinal: No reported hemesis, hematochezia, vomiting, or acute GI distress Musculoskeletal: Denies any acute onset joint swelling, redness, loss of ROM, or weakness Neurological: No reported episodes of acute onset apraxia, aphasia, dysarthria, agnosia, amnesia, paralysis, loss of coordination, or loss of consciousness  Medication Review  amLODipine, atorvastatin, chlorthalidone, ferrous sulfate, lisinopril, pantoprazole, polyethylene glycol, sildenafil, traMADol, and traZODone  History Review  Allergy: [name] has No Known Allergies. Drug: [name]  reports no history of drug use. Alcohol:  reports current alcohol use of about 1.0 standard drink per week. Tobacco:  reports that he has quit smoking. He has never used smokeless tobacco. Social: [name]  reports that he has quit smoking. He has never used smokeless tobacco. He reports current alcohol use of about 1.0 standard drink per week. He reports that he does not use drugs. Medical:  has a past medical history of Back pain, Blood transfusion without reported diagnosis (12/2015), Diverticulitis, Hypertension, and Ulcer, stomach peptic, chronic. Surgical: [name]  has a past surgical history that includes Esophagogastroduodenoscopy (egd) with propofol (N/A, 01/03/2016); Colonoscopy with propofol (N/A, 08/02/2017); Esophagogastroduodenoscopy (egd) with propofol (08/02/2017); Esophagogastroduodenoscopy (egd) with propofol (N/A, 02/05/2018); Colonoscopy with propofol (N/A, 02/05/2018); Esophagogastroduodenoscopy (egd) with propofol (N/A, 03/30/2018); Esophagogastroduodenoscopy (egd) with propofol (N/A, 08/09/2019); Colonoscopy with propofol (N/A, 08/09/2019); Givens capsule study  (N/A, 04/24/2020); and Esophagogastroduodenoscopy (egd) with propofol (N/A, 04/24/2020). Family: family history includes Diabetes in his mother; Heart disease in his father; Prostate cancer in his brother.  Laboratory Chemistry Profile   Renal Lab Results  Component Value Date   BUN 18 02/21/2020   CREATININE 1.24 02/21/2020   GFRAA >60 02/21/2020   GFRNONAA 60 (L) 02/21/2020    Hepatic Lab Results  Component Value Date   AST 23 08/09/2019   ALT 15 08/09/2019   ALBUMIN 3.8 08/09/2019   ALKPHOS 53 08/09/2019   LIPASE 17 08/08/2019    Electrolytes Lab Results  Component Value Date   NA 138 08/09/2019   K 3.9 08/09/2019   CL 106 08/09/2019   CALCIUM 8.7 (L) 08/09/2019   MG 1.9 01/04/2016    Bone Lab Results  Component Value Date   VD25OH 24.0 (L) 12/26/2015    Inflammation (CRP: Acute Phase) (ESR: Chronic Phase) Lab Results  Component Value Date   CRP 0.6 12/26/2015   ESRSEDRATE 3 12/26/2015         Note: Above Lab results reviewed.  Recent Imaging Review  DG PAIN CLINIC C-ARM 1-60 MIN NO REPORT Fluoro was used, but no Radiologist interpretation  will be provided.  Please refer to "NOTES" tab for provider progress note. Note: Reviewed        Physical Exam  General appearance: Well nourished, well developed, and well hydrated. In no apparent acute distress Mental status: Alert, oriented x 3 (person, place, & time)       Respiratory: No evidence of acute respiratory distress Eyes: PERLA Vitals: BP 127/76 (BP Location: Right Arm, Patient Position: Sitting, Cuff Size: Normal)   Pulse 73   Temp (!) 97.3 F (36.3 C) (Temporal)   Resp 16   Ht 6' (1.829 m)   Wt 179 lb (81.2 kg)   SpO2 100%   BMI 24.28 kg/m  BMI: Estimated body mass index is 24.28 kg/m as calculated from the following:   Height as of this encounter: 6' (1.829 m).   Weight as of this encounter: 179 lb (81.2 kg). Ideal: Ideal body weight: 77.6 kg (171 lb 1.2 oz) Adjusted ideal body weight: 79 kg  (174 lb 3.9 oz)  Assessment   Status Diagnosis  Controlled Controlled Controlled 1. Acute exacerbation of chronic low back pain   2. Chronic low back pain (1ry area of Pain) (Bilateral) (R>L) w/o sciatica   3. Lumbar facet syndrome (Bilateral) (R>L)   4. Chronic hip pain (2ry area of Pain) (Bilateral) (R>L)   5. Chronic lower extremity pain (3ry area of Pain) (Bilateral) (R>L)   6. Falls, sequela   7. Diffuse myofascial pain syndrome   8. Cervicalgia   9. Chronic pain syndrome   10. Pharmacologic therapy   11. Chronic use of opiate for therapeutic purpose   12. Uncomplicated opioid dependence ([place])   13. Encounter for medication management      Updated Problems: Problem  Falls, Sequela  Chronic hip pain (2ry area of Pain) (Bilateral) (R>L)  Chronic low back pain (1ry area of Pain) (Bilateral) (R>L) w/o sciatica  Chronic lower extremity pain (3ry area of Pain) (Bilateral) (R>L)    Plan of Care  Problem-specific:  No problem-specific Assessment & Plan notes found for this encounter.  Caleb Edwards has a current medication list which includes the following long-term medication(s): atorvastatin, chlorthalidone, lisinopril, pantoprazole, trazodone, and [START ON 06/19/2021] tramadol.  Pharmacotherapy (Medications Ordered): Meds ordered this encounter  Medications   traMADol (ULTRAM) 50 MG tablet    Sig: Take 2 tablets (100 mg total) by mouth every 6 (six) hours as needed for severe pain. Each refill must last 30 days.    Dispense:  240 tablet    Refill:  5    Not a duplicate. Do NOT delete! Dispense 1 day early if closed on refill date. Avoid benzodiazepines within 8 hours of opioids. Do not send refill requests.    Orders:  Orders Placed This Encounter  Procedures   LUMBAR FACET(MEDIAL BRANCH NERVE BLOCK) MBNB    Standing Status:   Future    Standing Expiration Date:   07/16/2021    Scheduling Instructions:     Procedure: Lumbar facet block (AKA.: Lumbosacral  medial branch nerve block)     Side: Bilateral     Level: L3-4, L4-5, & L5-S1 Facets (L2, L3, L4, L5, & S1 Medial Branch Nerves)     Sedation: No Sedation.     Timeframe: ASAA    Order Specific Question:   Where will this procedure be performed?    Answer:   [facility]   DG Lumbar Spine Complete W/Bend    Patient presents with axial pain with possible  radicular component. Please assist [place] in identifying specific level(s) and laterality of any additional findings such as: 1. Facet (Zygapophyseal) joint DJD (Hypertrophy, space narrowing, subchondral sclerosis, and/or osteophyte formation) 2. DDD and/or IVDD (Loss of disc height, desiccation, gas patterns, osteophytes, endplate sclerosis, or "Black disc disease") 3. Pars defects 4. Spondylolisthesis, spondylosis, and/or spondyloarthropathies (include Degree/Grade of displacement in mm) (stability) 5. Vertebral body Fractures (acute/chronic) (state percentage of collapse) 6. Demineralization (osteopenia/osteoporotic) 7. Bone pathology 8. Foraminal narrowing  9. Surgical changes    Standing Status:   Future    Standing Expiration Date:   07/16/2021    Scheduling Instructions:     Imaging must be done as soon as possible. Inform patient that order will expire within 30 days and I will not renew it.    Order Specific Question:   Reason for Exam (SYMPTOM  OR DIAGNOSIS REQUIRED)    Answer:   Low back pain    Order Specific Question:   Preferred imaging location?    Answer:   [facility]    Order Specific Question:   Call Results- Best Contact Number?    Answer:   [phone] ([facility])    Order Specific Question:   Radiology Contrast Protocol - do NOT remove file path    Answer:   [email or web address]    Order Specific Question:   Release to patient    Answer:   Immediate   Informed Consent Details: Physician/Practitioner Attestation; Transcribe to consent form and obtain patient  signature    Nursing Order: Transcribe to consent form and obtain patient signature. Note: Always confirm laterality of pain with [name], before procedure.    Order Specific Question:   Physician/Practitioner attestation of informed consent for procedure/surgical case    Answer:   I, the physician/practitioner, attest that I have discussed with the patient the benefits, risks, side effects, alternatives, likelihood of achieving goals and potential problems during recovery for the procedure that I have provided informed consent.    Order Specific Question:   Procedure    Answer:   Lumbar Facet Block  under fluoroscopic guidance    Order Specific Question:   Physician/Practitioner performing the procedure    Answer:   Becka Lagasse A. Dossie Arbour MD    Order Specific Question:   Indication/Reason    Answer:   Low Back Pain, with our without leg pain, due to Facet Joint Arthralgia (Joint Pain) Spondylosis (Arthritis of the Spine), without myelopathy or radiculopathy (Nerve Damage).    Follow-up plan:   Return for Proc-day(T,Th), (NS), (Clinic) procedure: (B) L-FCT BLK.     Interventional Therapies  Risk  Complexity Considerations:   CKD HTN   Planned  Pending:   Pending further evaluation   Under consideration:   Diagnostic right IA hip joint injection  Diagnostic bilateral lumbar facet block  Possible bilateral lumbar facet RFA    Completed:   Diagnostic left cervical ESI x1 (10/28/2020)  Therapeutic left L3-4 LESI x2 (06/13/2018)  Therapeutic left L3 TFESI x1 (03/13/2020)  Therapeutic left L4 TFESI x2 (03/13/2020)  Therapeutic right L3 TFESI x2 (03/13/2020)  Therapeutic right L4 TFESI x3 (03/13/2020)  Referral to neurosurgery Elayne Guerin, MD) for cervical instability (11/11/2020)    Therapeutic  Palliative (PRN) options:   Palliative left L3-4 LESI #3  Palliative left L3 TFESI #2  Palliative left L4 TFESI #3  Palliative right L3 TFESI #3  Palliative right L4 TFESI #4        Recent Visits No visits were found  meeting these conditions. Showing recent visits within past 90 days and meeting all other requirements Today's Visits Date Type Provider Dept  06/16/21 Office Visit Milinda Pointer, MD [facility]  Showing today's visits and meeting all other requirements Future Appointments No visits were found meeting these conditions. Showing future appointments within next 90 days and meeting all other requirements I discussed the assessment and treatment plan with the patient. The patient was provided an opportunity to ask questions and all were answered. The patient agreed with the plan and demonstrated an understanding of the instructions.  Patient advised to call back or seek an in-person evaluation if the symptoms or condition worsens.  Duration of encounter: 30 minutes.  Note by: Gaspar Cola, MD Date: 06/16/2021; Time: 2:33 PM

## 2021-06-16 ENCOUNTER — Ambulatory Visit: Payer: Medicare HMO | Attending: Pain Medicine | Admitting: Pain Medicine

## 2021-06-16 ENCOUNTER — Encounter: Payer: Self-pay | Admitting: Pain Medicine

## 2021-06-16 ENCOUNTER — Other Ambulatory Visit: Payer: Self-pay

## 2021-06-16 VITALS — BP 127/76 | HR 73 | Temp 97.3°F | Resp 16 | Ht 72.0 in | Wt 179.0 lb

## 2021-06-16 DIAGNOSIS — M4722 Other spondylosis with radiculopathy, cervical region: Secondary | ICD-10-CM | POA: Insufficient documentation

## 2021-06-16 DIAGNOSIS — W19XXXS Unspecified fall, sequela: Secondary | ICD-10-CM

## 2021-06-16 DIAGNOSIS — M47816 Spondylosis without myelopathy or radiculopathy, lumbar region: Secondary | ICD-10-CM

## 2021-06-16 DIAGNOSIS — F1120 Opioid dependence, uncomplicated: Secondary | ICD-10-CM

## 2021-06-16 DIAGNOSIS — M25552 Pain in left hip: Secondary | ICD-10-CM | POA: Diagnosis not present

## 2021-06-16 DIAGNOSIS — Z79891 Long term (current) use of opiate analgesic: Secondary | ICD-10-CM

## 2021-06-16 DIAGNOSIS — M79604 Pain in right leg: Secondary | ICD-10-CM

## 2021-06-16 DIAGNOSIS — G894 Chronic pain syndrome: Secondary | ICD-10-CM

## 2021-06-16 DIAGNOSIS — M7918 Myalgia, other site: Secondary | ICD-10-CM

## 2021-06-16 DIAGNOSIS — M25551 Pain in right hip: Secondary | ICD-10-CM | POA: Diagnosis not present

## 2021-06-16 DIAGNOSIS — M4312 Spondylolisthesis, cervical region: Secondary | ICD-10-CM | POA: Insufficient documentation

## 2021-06-16 DIAGNOSIS — M79605 Pain in left leg: Secondary | ICD-10-CM | POA: Diagnosis not present

## 2021-06-16 DIAGNOSIS — Z79899 Other long term (current) drug therapy: Secondary | ICD-10-CM

## 2021-06-16 DIAGNOSIS — G8929 Other chronic pain: Secondary | ICD-10-CM | POA: Diagnosis present

## 2021-06-16 DIAGNOSIS — M4802 Spinal stenosis, cervical region: Secondary | ICD-10-CM | POA: Insufficient documentation

## 2021-06-16 DIAGNOSIS — Z87891 Personal history of nicotine dependence: Secondary | ICD-10-CM | POA: Diagnosis not present

## 2021-06-16 DIAGNOSIS — M545 Low back pain, unspecified: Secondary | ICD-10-CM

## 2021-06-16 DIAGNOSIS — M4726 Other spondylosis with radiculopathy, lumbar region: Secondary | ICD-10-CM | POA: Insufficient documentation

## 2021-06-16 DIAGNOSIS — M542 Cervicalgia: Secondary | ICD-10-CM

## 2021-06-16 MED ORDER — TRAMADOL HCL 50 MG PO TABS: 100.0000 mg | ORAL_TABLET | Freq: Four times a day (QID) | ORAL | 5 refills | Status: DC | PRN

## 2021-06-16 NOTE — Patient Instructions (Addendum)
____________________________________________________________________________________________  Medication Rules  Purpose: To inform patients, and their family members, of our rules and regulations.  Applies to: All patients receiving prescriptions (written or electronic).  Pharmacy of record: Pharmacy where electronic prescriptions will be sent. If written prescriptions are taken to a different pharmacy, please inform the nursing staff. The pharmacy listed in the electronic medical record should be the one where you would like electronic prescriptions to be sent.  Electronic prescriptions: In compliance with the [place] Strengthen Opioid Misuse Prevention (STOP) Act of 2017 (Session Law 2017-74/H243), effective October 04, 2018, all controlled substances must be electronically prescribed. Calling prescriptions to the pharmacy will cease to exist.  Prescription refills: Only during scheduled appointments. Applies to all prescriptions.  NOTE: The following applies primarily to controlled substances (Opioid* Pain Medications).   Type of encounter (visit): For patients receiving controlled substances, face-to-face visits are required. (Not an option or up to the patient.)  Patient's responsibilities: Pain Pills: Bring all pain pills to every appointment (except for procedure appointments). Pill Bottles: Bring pills in original pharmacy bottle. Always bring the newest bottle. Bring bottle, even if empty. Medication refills: You are responsible for knowing and keeping track of what medications you take and those you need refilled. The day before your appointment: write a list of all prescriptions that need to be refilled. The day of the appointment: give the list to the admitting nurse. Prescriptions will be written only during appointments. No prescriptions will be written on procedure days. If you forget a medication: it will not be "Called in", "Faxed", or "electronically sent". You will  need to get another appointment to get these prescribed. No early refills. Do not call asking to have your prescription filled early. Prescription Accuracy: You are responsible for carefully inspecting your prescriptions before leaving our office. Have the discharge nurse carefully go over each prescription with you, before taking them home. Make sure that your name is accurately spelled, that your address is correct. Check the name and dose of your medication to make sure it is accurate. Check the number of pills, and the written instructions to make sure they are clear and accurate. Make sure that you are given enough medication to last until your next medication refill appointment. Taking Medication: Take medication as prescribed. When it comes to controlled substances, taking less pills or less frequently than prescribed is permitted and encouraged. Never take more pills than instructed. Never take medication more frequently than prescribed.  Inform other Doctors: Always inform, all of your healthcare providers, of all the medications you take. Pain Medication from other Providers: You are not allowed to accept any additional pain medication from any other Doctor or Healthcare provider. There are two exceptions to this rule. (see below) In the event that you require additional pain medication, you are responsible for notifying us, as stated below. Cough Medicine: Often these contain an opioid, such as codeine or hydrocodone. Never accept or take cough medicine containing these opioids if you are already taking an opioid* medication. The combination may cause respiratory failure and death. Medication Agreement: You are responsible for carefully reading and following our Medication Agreement. This must be signed before receiving any prescriptions from our practice. Safely store a copy of your signed Agreement. Violations to the Agreement will result in no further prescriptions. (Additional copies of our  Medication Agreement are available upon request.) Laws, Rules, & Regulations: All patients are expected to follow all Federal and State Laws, Statutes, Rules, & Regulations. Ignorance of   the Laws does not constitute a valid excuse.  Illegal drugs and Controlled Substances: The use of illegal substances (including, but not limited to marijuana and its derivatives) and/or the illegal use of any controlled substances is strictly prohibited. Violation of this rule may result in the immediate and permanent discontinuation of any and all prescriptions being written by our practice. The use of any illegal substances is prohibited. Adopted CDC guidelines & recommendations: Target dosing levels will be at or below 60 MME/day. Use of benzodiazepines** is not recommended.  Exceptions: There are only two exceptions to the rule of not receiving pain medications from other Healthcare Providers. Exception #1 (Emergencies): In the event of an emergency (i.e.: accident requiring emergency care), you are allowed to receive additional pain medication. However, you are responsible for: As soon as you are able, call our office [phone], at any time of the day or night, and leave a message stating your name, the date and nature of the emergency, and the name and dose of the medication prescribed. In the event that your call is answered by a member of our staff, make sure to document and save the date, time, and the name of the person that took your information.  Exception #2 (Planned Surgery): In the event that you are scheduled by another doctor or dentist to have any type of surgery or procedure, you are allowed (for a period no longer than 30 days), to receive additional pain medication, for the acute post-op pain. However, in this case, you are responsible for picking up a copy of our "Post-op Pain Management for Surgeons" handout, and giving it to your surgeon or dentist. This document is available at our office, and  does not require an appointment to obtain it. Simply go to our office during business hours (Monday-Thursday from 8:00 AM to 4:00 PM) (Friday 8:00 AM to 12:00 Noon) or if you have a scheduled appointment with us, prior to your surgery, and ask for it by name. In addition, you are responsible for: calling our office [phone], at any time of the day or night, and leaving a message stating your name, name of your surgeon, type of surgery, and date of procedure or surgery. Failure to comply with your responsibilities may result in termination of therapy involving the controlled substances.  *Opioid medications include: morphine, codeine, oxycodone, oxymorphone, hydrocodone, hydromorphone, meperidine, tramadol, tapentadol, buprenorphine, fentanyl, methadone. **Benzodiazepine medications include: diazepam (Valium), alprazolam (Xanax), clonazepam (Klonopine), lorazepam (Ativan), clorazepate (Tranxene), chlordiazepoxide (Librium), estazolam (Prosom), oxazepam (Serax), temazepam (Restoril), triazolam (Halcion) (Last updated: 09/01/2020) ____________________________________________________________________________________________  ____________________________________________________________________________________________  Medication Recommendations and Reminders  Applies to: All patients receiving prescriptions (written and/or electronic).  Medication Rules & Regulations: These rules and regulations exist for your safety and that of others. They are not flexible and neither are we. Dismissing or ignoring them will be considered "non-compliance" with medication therapy, resulting in complete and irreversible termination of such therapy. (See document titled "Medication Rules" for more details.) In all conscience, because of safety reasons, we cannot continue providing a therapy where the patient does not follow instructions.  Pharmacy of record:  Definition: This is the pharmacy where your electronic  prescriptions will be sent.  We do not endorse any particular pharmacy, however, we have experienced problems with [facility] not securing enough medication supply for the community. We do not restrict you in your choice of pharmacy. However, once we write for your prescriptions, we will NOT be re-sending more prescriptions to fix restricted supply problems   created by your pharmacy, or your insurance.  The pharmacy listed in the electronic medical record should be the one where you want electronic prescriptions to be sent. If you choose to change pharmacy, simply notify our nursing staff.  Recommendations: Keep all of your pain medications in a safe place, under lock and key, even if you live alone. We will NOT replace lost, stolen, or damaged medication. After you fill your prescription, take 1 week's worth of pills and put them away in a safe place. You should keep a separate, properly labeled bottle for this purpose. The remainder should be kept in the original bottle. Use this as your primary supply, until it runs out. Once it's gone, then you know that you have 1 week's worth of medicine, and it is time to come in for a prescription refill. If you do this correctly, it is unlikely that you will ever run out of medicine. To make sure that the above recommendation works, it is very important that you make sure your medication refill appointments are scheduled at least 1 week before you run out of medicine. To do this in an effective manner, make sure that you do not leave the office without scheduling your next medication management appointment. Always ask the nursing staff to show you in your prescription , when your medication will be running out. Then arrange for the receptionist to get you a return appointment, at least 7 days before you run out of medicine. Do not wait until you have 1 or 2 pills left, to come in. This is very poor planning and does not take into consideration that we may need to  cancel appointments due to bad weather, sickness, or emergencies affecting our staff. DO NOT ACCEPT A "Partial Fill": If for any reason your pharmacy does not have enough pills/tablets to completely fill or refill your prescription, do not allow for a "partial fill". The law allows the pharmacy to complete that prescription within 72 hours, without requiring a new prescription. If they do not fill the rest of your prescription within those 72 hours, you will need a separate prescription to fill the remaining amount, which we will NOT provide. If the reason for the partial fill is your insurance, you will need to talk to the pharmacist about payment alternatives for the remaining tablets, but again, DO NOT ACCEPT A PARTIAL FILL, unless you can trust your pharmacist to obtain the remainder of the pills within 72 hours.  Prescription refills and/or changes in medication(s):  Prescription refills, and/or changes in dose or medication, will be conducted only during scheduled medication management appointments. (Applies to both, written and electronic prescriptions.) No refills on procedure days. No medication will be changed or started on procedure days. No changes, adjustments, and/or refills will be conducted on a procedure day. Doing so will interfere with the diagnostic portion of the procedure. No phone refills. No medications will be "called into the pharmacy". No Fax refills. No weekend refills. No Holliday refills. No after hours refills.  Remember:  Business hours are:  Monday to Thursday 8:00 AM to 4:00 PM Provider's Schedule: Francisco Naveira, MD - Appointments are:  Medication management: Monday and Wednesday 8:00 AM to 4:00 PM Procedure day: Tuesday and Thursday 7:30 AM to 4:00 PM Bilal Lateef, MD - Appointments are:  Medication management: Tuesday and Thursday 8:00 AM to 4:00 PM Procedure day: Monday and Wednesday 7:30 AM to 4:00 PM (Last update:  04/23/2020) ____________________________________________________________________________________________  ____________________________________________________________________________________________  CBD (cannabidiol) WARNING    Applicable to: All individuals currently taking or considering taking CBD (cannabidiol) and, more important, all patients taking opioid analgesic controlled substances (pain medication). (Example: oxycodone; oxymorphone; hydrocodone; hydromorphone; morphine; methadone; tramadol; tapentadol; fentanyl; buprenorphine; butorphanol; dextromethorphan; meperidine; codeine; etc.)  Legal status: CBD remains a Schedule I drug prohibited for any use. CBD is illegal with one exception. In the [place], CBD has a limited Food and Drug Administration (FDA) approval for the treatment of two specific types of epilepsy disorders. Only one CBD product has been approved by the FDA for this purpose: "Epidiolex". FDA is aware that some companies are marketing products containing cannabis and cannabis-derived compounds in ways that violate the Federal Food, Drug and Cosmetic Act (FD&C Act) and that may put the health and safety of consumers at risk. The FDA, a Federal agency, has not enforced the CBD status since 2018.   Legality: Some manufacturers ship CBD products nationally, which is illegal. Often such products are sold online and are therefore available throughout the country. CBD is openly sold in head shops and health food stores in some states where such sales have not been explicitly legalized. Selling unapproved products with unsubstantiated therapeutic claims is not only a violation of the law, but also can put patients at risk, as these products have not been proven to be safe or effective. Federal illegality makes it difficult to conduct research on CBD.  Reference: "FDA Regulation of Cannabis and Cannabis-Derived Products, Including Cannabidiol (CBD)" -  https://www.fda.gov/news-events/public-health-focus/fda-regulation-cannabis-and-cannabis-derived-products-including-cannabidiol-cbd  Warning: CBD is not FDA approved and has not undergo the same manufacturing controls as prescription drugs.  This means that the purity and safety of available CBD may be questionable. Most of the time, despite manufacturer's claims, it is contaminated with THC (delta-9-tetrahydrocannabinol - the chemical in marijuana responsible for the "HIGH").  When this is the case, the THC contaminant will trigger a positive urine drug screen (UDS) test for Marijuana (carboxy-THC). Because a positive UDS for any illicit substance is a violation of our medication agreement, your opioid analgesics (pain medicine) may be permanently discontinued.  MORE ABOUT CBD  General Information: CBD  is a derivative of the Marijuana (cannabis sativa) plant discovered in 1940. It is one of the 113 identified substances found in Marijuana. It accounts for up to 40% of the plant's extract. As of 2018, preliminary clinical studies on CBD included research for the treatment of anxiety, movement disorders, and pain. CBD is available and consumed in multiple forms, including inhalation of smoke or vapor, as an aerosol spray, and by mouth. It may be supplied as an oil containing CBD, capsules, dried cannabis, or as a liquid solution. CBD is thought not to be as psychoactive as THC (delta-9-tetrahydrocannabinol - the chemical in marijuana responsible for the "HIGH"). Studies suggest that CBD may interact with different biological target receptors in the body, including cannabinoid and other neurotransmitter receptors. As of 2018 the mechanism of action for its biological effects has not been determined.  Side-effects  Adverse reactions: Dry mouth, diarrhea, decreased appetite, fatigue, drowsiness, malaise, weakness, sleep disturbances, and others.  Drug interactions: CBC may interact with other medications  such as blood-thinners. (Last update: 05/10/2020) ____________________________________________________________________________________________  ____________________________________________________________________________________________  Drug Holidays (Slow)  What is a "Drug Holiday"? Drug Holiday: is the name given to the period of time during which a patient stops taking a medication(s) for the purpose of eliminating tolerance to the drug.  Benefits Improved effectiveness of opioids. Decreased opioid dose needed to achieve benefits. Improved pain with lesser dose.    What is tolerance? Tolerance: is the progressive decreased in effectiveness of a drug due to its repetitive use. With repetitive use, the body gets use to the medication and as a consequence, it loses its effectiveness. This is a common problem seen with opioid pain medications. As a result, a larger dose of the drug is needed to achieve the same effect that used to be obtained with a smaller dose.  How long should a "Drug Holiday" last? You should stay off of the pain medicine for at least 14 consecutive days. (2 weeks)  Should I stop the medicine "cold [place]"? No. You should always coordinate with your Pain Specialist so that he/she can provide you with the correct medication dose to make the transition as smoothly as possible.  How do I stop the medicine? Slowly. You will be instructed to decrease the daily amount of pills that you take by one (1) pill every seven (7) days. This is called a "slow downward taper" of your dose. For example: if you normally take four (4) pills per day, you will be asked to drop this dose to three (3) pills per day for seven (7) days, then to two (2) pills per day for seven (7) days, then to one (1) per day for seven (7) days, and at the end of those last seven (7) days, this is when the "Drug Holiday" would start.   Will I have withdrawals? By doing a "slow downward taper" like this one, it  is unlikely that you will experience any significant withdrawal symptoms. Typically, what triggers withdrawals is the sudden stop of a high dose opioid therapy. Withdrawals can usually be avoided by slowly decreasing the dose over a prolonged period of time. If you do not follow these instructions and decide to stop your medication abruptly, withdrawals may be possible.  What are withdrawals? Withdrawals: refers to the wide range of symptoms that occur after stopping or dramatically reducing opiate drugs after heavy and prolonged use. Withdrawal symptoms do not occur to patients that use low dose opioids, or those who take the medication sporadically. Contrary to benzodiazepine (example: Valium, Xanax, etc.) or alcohol withdrawals ("Delirium Tremens"), opioid withdrawals are not lethal. Withdrawals are the physical manifestation of the body getting rid of the excess receptors.  Expected Symptoms Early symptoms of withdrawal may include: Agitation Anxiety Muscle aches Increased tearing Insomnia Runny nose Sweating Yawning  Late symptoms of withdrawal may include: Abdominal cramping Diarrhea Dilated pupils Goose bumps Nausea Vomiting  Will I experience withdrawals? Due to the slow nature of the taper, it is very unlikely that you will experience any.  What is a slow taper? Taper: refers to the gradual decrease in dose.  (Last update: 04/23/2020) ____________________________________________________________________________________________   ______________________________________________________________________  Preparing for your procedure (without sedation)  Procedure appointments are limited to planned procedures: No Prescription Refills. No disability issues will be discussed. No medication changes will be discussed.  Instructions: Oral Intake: Do not eat or drink anything for at least 6 hours prior to your procedure. (Exception: Blood Pressure Medication. See  below.) Transportation: Unless otherwise stated by your physician, you may drive yourself after the procedure. Blood Pressure Medicine: Do not forget to take your blood pressure medicine with a sip of water the morning of the procedure. If your Diastolic (lower reading)is above 100 mmHg, elective cases will be cancelled/rescheduled. Blood thinners: These will need to be stopped for procedures. Notify our staff if you are taking any blood thinners. Depending on which one you  take, there will be specific instructions on how and when to stop it. Diabetics on insulin: Notify the staff so that you can be scheduled 1st case in the morning. If your diabetes requires high dose insulin, take only  of your normal insulin dose the morning of the procedure and notify the staff that you have done so. Preventing infections: Shower with an antibacterial soap the morning of your procedure.  Build-up your immune system: Take 1000 mg of Vitamin C with every meal (3 times a day) the day prior to your procedure. Antibiotics: Inform the staff if you have a condition or reason that requires you to take antibiotics before dental procedures. Pregnancy: If you are pregnant, call and cancel the procedure. Sickness: If you have a cold, fever, or any active infections, call and cancel the procedure. Arrival: You must be in the facility at least 30 minutes prior to your scheduled procedure. Children: Do not bring any children with you. Dress appropriately: Bring dark clothing that you would not mind if they get stained. Valuables: Do not bring any jewelry or valuables.  Reasons to call and reschedule or cancel your procedure: (Following these recommendations will minimize the risk of a serious complication.) Surgeries: Avoid having procedures within 2 weeks of any surgery. (Avoid for 2 weeks before or after any surgery). Flu Shots: Avoid having procedures within 2 weeks of a flu shots or . (Avoid for 2 weeks before or after  immunizations). Barium: Avoid having a procedure within 7-10 days after having had a radiological study involving the use of radiological contrast. (Myelograms, Barium swallow or enema study). Heart attacks: Avoid any elective procedures or surgeries for the initial 6 months after a "Myocardial Infarction" (Heart Attack). Blood thinners: It is imperative that you stop these medications before procedures. Let us know if you if you take any blood thinner.  Infection: Avoid procedures during or within two weeks of an infection (including chest colds or gastrointestinal problems). Symptoms associated with infections include: Localized redness, fever, chills, night sweats or profuse sweating, burning sensation when voiding, cough, congestion, stuffiness, runny nose, sore throat, diarrhea, nausea, vomiting, cold or Flu symptoms, recent or current infections. It is specially important if the infection is over the area that we intend to treat. Heart and lung problems: Symptoms that may suggest an active cardiopulmonary problem include: cough, chest pain, breathing difficulties or shortness of breath, dizziness, ankle swelling, uncontrolled high or unusually low blood pressure, and/or palpitations. If you are experiencing any of these symptoms, cancel your procedure and contact your primary care physician for an evaluation.  Remember:  Regular Business hours are:  Monday to Thursday 8:00 AM to 4:00 PM  Provider's Schedule: Delano Metz, MD:  Procedure days: Tuesday and Thursday 7:30 AM to 4:00 PM  Edward Jolly, MD:  Procedure days: Monday and Wednesday 7:30 AM to 4:00 PM ______________________________________________________________________  ____________________________________________________________________________________________  General Risks and Possible Complications  Patient Responsibilities: It is important that you read this as it is part of your informed consent. It is our duty to inform  you of the risks and possible complications associated with treatments offered to you. It is your responsibility as a patient to read this and to ask questions about anything that is not clear or that you believe was not covered in this document.  Patient's Rights: You have the right to refuse treatment. You also have the right to change your mind, even after initially having agreed to have the treatment done. However, under this last  option, if you wait until the last second to change your mind, you may be charged for the materials used up to that point.  Introduction: Medicine is not an Visual merchandiser. Everything in Medicine, including the lack of treatment(s), carries the potential for danger, harm, or loss (which is by definition: Risk). In Medicine, a complication is a secondary problem, condition, or disease that can aggravate an already existing one. All treatments carry the risk of possible complications. The fact that a side effects or complications occurs, does not imply that the treatment was conducted incorrectly. It must be clearly understood that these can happen even when everything is done following the highest safety standards.  No treatment: You can choose not to proceed with the proposed treatment alternative. The "PRO(s)" would include: avoiding the risk of complications associated with the therapy. The "CON(s)" would include: not getting any of the treatment benefits. These benefits fall under one of three categories: diagnostic; therapeutic; and/or palliative. Diagnostic benefits include: getting information which can ultimately lead to improvement of the disease or symptom(s). Therapeutic benefits are those associated with the successful treatment of the disease. Finally, palliative benefits are those related to the decrease of the primary symptoms, without necessarily curing the condition (example: decreasing the pain from a flare-up of a chronic condition, such as incurable terminal  cancer).  General Risks and Complications: These are associated to most interventional treatments. They can occur alone, or in combination. They fall under one of the following six (6) categories: no benefit or worsening of symptoms; bleeding; infection; nerve damage; allergic reactions; and/or death. No benefits or worsening of symptoms: In Medicine there are no guarantees, only probabilities. No healthcare provider can ever guarantee that a medical treatment will work, they can only state the probability that it may. Furthermore, there is always the possibility that the condition may worsen, either directly, or indirectly, as a consequence of the treatment. Bleeding: This is more common if the patient is taking a blood thinner, either prescription or over the counter (example: Goody Powders, Fish oil, Aspirin, Garlic, etc.), or if suffering a condition associated with impaired coagulation (example: Hemophilia, cirrhosis of the liver, low platelet counts, etc.). However, even if you do not have one on these, it can still happen. If you have any of these conditions, or take one of these drugs, make sure to notify your treating physician. Infection: This is more common in patients with a compromised immune system, either due to disease (example: diabetes, cancer, human immunodeficiency virus [HIV], etc.), or due to medications or treatments (example: therapies used to treat cancer and rheumatological diseases). However, even if you do not have one on these, it can still happen. If you have any of these conditions, or take one of these drugs, make sure to notify your treating physician. Nerve Damage: This is more common when the treatment is an invasive one, but it can also happen with the use of medications, such as those used in the treatment of cancer. The damage can occur to small secondary nerves, or to large primary ones, such as those in the spinal cord and brain. This damage may be temporary or permanent  and it may lead to impairments that can range from temporary numbness to permanent paralysis and/or brain death. Allergic Reactions: Any time a substance or material comes in contact with our body, there is the possibility of an allergic reaction. These can range from a mild skin rash (contact dermatitis) to a severe systemic reaction (anaphylactic reaction),  which can result in death. Death: In general, any medical intervention can result in death, most of the time due to an unforeseen complication. ____________________________________________________________________________________________ Facet Blocks Patient Information  Description: The facets are joints in the spine between the vertebrae.  Like any joints in the body, facets can become irritated and painful.  Arthritis can also effect the facets.  By injecting steroids and local anesthetic in and around these joints, we can temporarily block the nerve supply to them.  Steroids act directly on irritated nerves and tissues to reduce selling and inflammation which often leads to decreased pain.  Facet blocks may be done anywhere along the spine from the neck to the low back depending upon the location of your pain.   After numbing the skin with local anesthetic (like Novocaine), a small needle is passed onto the facet joints under x-ray guidance.  You may experience a sensation of pressure while this is being done.  The entire block usually lasts about 15-25 minutes.   Conditions which may be treated by facet blocks:  Low back/buttock pain Neck/shoulder pain Certain types of headaches  Preparation for the injection:  Do not eat any solid food or dairy products within 8 hours of your appointment. You may drink clear liquid up to 3 hours before appointment.  Clear liquids include water, black coffee, juice or soda.  No milk or cream please. You may take your regular medication, including pain medications, with a sip of water before your  appointment.  Diabetics should hold regular insulin (if taken separately) and take 1/2 normal NPH dose the morning of the procedure.  Carry some sugar containing items with you to your appointment. A driver must accompany you and be prepared to drive you home after your procedure. Bring all your current medications with you. An IV may be inserted and sedation may be given at the discretion of the physician. A blood pressure cuff, EKG and other monitors will often be applied during the procedure.  Some patients may need to have extra oxygen administered for a short period. You will be asked to provide medical information, including your allergies and medications, prior to the procedure.  We must know immediately if you are taking blood thinners (like Coumadin/Warfarin) or if you are allergic to IV iodine contrast (dye).  We must know if you could possible be pregnant.  Possible side-effects:  Bleeding from needle site Infection (rare, may require surgery) Nerve injury (rare) Numbness & tingling (temporary) Difficulty urinating (rare, temporary) Spinal headache (a headache worse with upright posture) Light-headedness (temporary) Pain at injection site (serveral days) Decreased blood pressure (rare, temporary) Weakness in arm/leg (temporary) Pressure sensation in back/neck (temporary)   Call if you experience:  Fever/chills associated with headache or increased back/neck pain Headache worsened by an upright position New onset, weakness or numbness of an extremity below the injection site Hives or difficulty breathing (go to the emergency room) Inflammation or drainage at the injection site(s) Severe back/neck pain greater than usual New symptoms which are concerning to you  Please note:  Although the local anesthetic injected can often make your back or neck feel good for several hours after the injection, the pain will likely return. It takes 3-7 days for steroids to work.  You may  not notice any pain relief for at least one week.  If effective, we will often do a series of 2-3 injections spaced 3-6 weeks apart to maximally decrease your pain.  After the initial series, you may  be a candidate for a more permanent nerve block of the facets.  If you have any questions, please call [phone] [facility]

## 2021-06-16 NOTE — Progress Notes (Signed)
Nursing Pain Medication Assessment:  Safety precautions to be maintained throughout the outpatient stay will include: orient to surroundings, keep bed in low position, maintain call bell within reach at all times, provide assistance with transfer out of bed and ambulation.  Medication Inspection Compliance: Pill count conducted under aseptic conditions, in front of the patient. Neither the pills nor the bottle was removed from the patient's sight at any time. Once count was completed pills were immediately returned to the patient in their original bottle.  Medication: Tramadol (Ultram) Pill/Patch Count:  140 of 240 pills remain Pill/Patch Appearance: Markings consistent with prescribed medication Bottle Appearance: Standard pharmacy container. Clearly labeled. Filled Date: 08 / 29 / 2022 Last Medication intake:  Today

## 2021-06-23 ENCOUNTER — Ambulatory Visit
Admission: RE | Admit: 2021-06-23 | Discharge: 2021-06-23 | Disposition: A | Discharge status: Home / Self Care | Payer: Medicare HMO | Source: Ambulatory Visit | Attending: Pain Medicine | Admitting: Pain Medicine

## 2021-06-23 DIAGNOSIS — M25552 Pain in left hip: Secondary | ICD-10-CM | POA: Insufficient documentation

## 2021-06-23 DIAGNOSIS — M79605 Pain in left leg: Secondary | ICD-10-CM | POA: Diagnosis not present

## 2021-06-23 DIAGNOSIS — M79604 Pain in right leg: Secondary | ICD-10-CM | POA: Insufficient documentation

## 2021-06-23 DIAGNOSIS — W19XXXS Unspecified fall, sequela: Secondary | ICD-10-CM

## 2021-06-23 DIAGNOSIS — M545 Low back pain, unspecified: Secondary | ICD-10-CM | POA: Insufficient documentation

## 2021-06-23 DIAGNOSIS — M25551 Pain in right hip: Secondary | ICD-10-CM | POA: Insufficient documentation

## 2021-06-23 DIAGNOSIS — M4317 Spondylolisthesis, lumbosacral region: Secondary | ICD-10-CM | POA: Insufficient documentation

## 2021-06-23 DIAGNOSIS — G8929 Other chronic pain: Secondary | ICD-10-CM | POA: Diagnosis not present

## 2021-06-23 DIAGNOSIS — M47816 Spondylosis without myelopathy or radiculopathy, lumbar region: Secondary | ICD-10-CM | POA: Insufficient documentation

## 2021-06-29 NOTE — Progress Notes (Signed)
PROVIDER NOTE: Information contained herein reflects review and annotations entered in association with encounter. Interpretation of such information and data should be left to medically-trained personnel. Information provided to patient can be located elsewhere in the medical record under "Patient Instructions". Document created using STT-dictation technology, any transcriptional errors that may result from process are unintentional.    Patient: Caleb Edwards  Service Category: Procedure  Provider: Oswaldo Done, MD  DOB: 05-02-1951  DOS: 06/30/2021  Location: [facility]  MRN: [id number]  Setting: Ambulatory - outpatient  Referring Provider: Delano Metz, MD  Type: Established Patient  Specialty: Interventional Pain Management  PCP: Antonieta Loveless, MD   Primary Reason for Visit: Interventional Pain Management Treatment. CC: Back Pain (lower)    Procedure:          Anesthesia, Analgesia, Anxiolysis:  Type: Lumbar Facet, Medial Branch Block(s)          Primary Purpose: Diagnostic Region: Posterolateral Lumbosacral Spine Level: L2, L3, L4, L5, & S1 Medial Branch Level(s). Injecting these levels blocks the L3-4, L4-5, and L5-S1 lumbar facet joints. Laterality: Bilateral  Type: Local Anesthesia Local Anesthetic: Lidocaine 1-2% Sedation: Minimal Anxiolysis  Indication(s): Anxiety & Analgesia Route: Infiltration ([place]/IM) IV Access: Available   Position: Prone   Indications: 1. Lumbar facet syndrome (Bilateral) (R>L)   2. Spondylosis without myelopathy or radiculopathy, lumbosacral region   3. Lumbar facet arthropathy   4. Grade 1 Anterolisthesis (7-76mm) of L5 over S1   5. DDD (degenerative disc disease), lumbosacral   6. Chronic low back pain (1ry area of Pain) (Bilateral) (R>L) w/o sciatica    Pain Score: Pre-procedure: 8 /10 Post-procedure: 4 /10     Pre-op H&P Assessment:  Mr. Westfall is a 70 y.o. (year old), male patient, seen today for interventional  treatment. He  has a past surgical history that includes Esophagogastroduodenoscopy (egd) with propofol (N/A, 01/03/2016); Colonoscopy with propofol (N/A, 08/02/2017); Esophagogastroduodenoscopy (egd) with propofol (08/02/2017); Esophagogastroduodenoscopy (egd) with propofol (N/A, 02/05/2018); Colonoscopy with propofol (N/A, 02/05/2018); Esophagogastroduodenoscopy (egd) with propofol (N/A, 03/30/2018); Esophagogastroduodenoscopy (egd) with propofol (N/A, 08/09/2019); Colonoscopy with propofol (N/A, 08/09/2019); Givens capsule study (N/A, 04/24/2020); and Esophagogastroduodenoscopy (egd) with propofol (N/A, 04/24/2020). Mr. Stracke has a current medication list which includes the following prescription(s): amlodipine, atorvastatin, chlorthalidone, ferrous sulfate, lisinopril, pantoprazole, polyethylene glycol, sildenafil, tramadol, and trazodone. His primarily concern today is the Back Pain (lower)  Initial Vital Signs:  Pulse/HCG Rate: 63ECG Heart Rate: 77 Temp: (!) 97 F (36.1 C) Resp: 16 BP: (!) 150/71 SpO2: 100 %  BMI: Estimated body mass index is 24.41 kg/m as calculated from the following:   Height as of this encounter: 6' (1.829 m).   Weight as of this encounter: 180 lb (81.6 kg).  Risk Assessment: Allergies: Reviewed. He has No Known Allergies.  Allergy Precautions: None required Coagulopathies: Reviewed. None identified.  Blood-thinner therapy: None at this time Active Infection(s): Reviewed. None identified. Mr. Noxon is afebrile  Site Confirmation: Mr. Ose was asked to confirm the procedure and laterality before marking the site Procedure checklist: Completed Consent: Before the procedure and under the influence of no sedative(s), amnesic(s), or anxiolytics, the patient was informed of the treatment options, risks and possible complications. To fulfill our ethical and legal obligations, as recommended by the American Medical Association's Code of Ethics, I have informed the patient of my  clinical impression; the nature and purpose of the treatment or procedure; the risks, benefits, and possible complications of the intervention; the alternatives, including doing  nothing; the risk(s) and benefit(s) of the alternative treatment(s) or procedure(s); and the risk(s) and benefit(s) of doing nothing. The patient was provided information about the general risks and possible complications associated with the procedure. These may include, but are not limited to: failure to achieve desired goals, infection, bleeding, organ or nerve damage, allergic reactions, paralysis, and death. In addition, the patient was informed of those risks and complications associated to Spine-related procedures, such as failure to decrease pain; infection (i.e.: Meningitis, epidural or intraspinal abscess); bleeding (i.e.: epidural hematoma, subarachnoid hemorrhage, or any other type of intraspinal or peri-dural bleeding); organ or nerve damage (i.e.: Any type of peripheral nerve, nerve root, or spinal cord injury) with subsequent damage to sensory, motor, and/or autonomic systems, resulting in permanent pain, numbness, and/or weakness of one or several areas of the body; allergic reactions; (i.e.: anaphylactic reaction); and/or death. Furthermore, the patient was informed of those risks and complications associated with the medications. These include, but are not limited to: allergic reactions (i.e.: anaphylactic or anaphylactoid reaction(s)); adrenal axis suppression; blood sugar elevation that in diabetics may result in ketoacidosis or comma; water retention that in patients with history of congestive heart failure may result in shortness of breath, pulmonary edema, and decompensation with resultant heart failure; weight gain; swelling or edema; medication-induced neural toxicity; particulate matter embolism and blood vessel occlusion with resultant organ, and/or nervous system infarction; and/or aseptic necrosis of one or  more joints. Finally, the patient was informed that Medicine is not an exact science; therefore, there is also the possibility of unforeseen or unpredictable risks and/or possible complications that may result in a catastrophic outcome. The patient indicated having understood very clearly. We have given the patient no guarantees and we have made no promises. Enough time was given to the patient to ask questions, all of which were answered to the patient's satisfaction. Mr. Abernethy has indicated that he wanted to continue with the procedure. Attestation: I, the ordering provider, attest that I have discussed with the patient the benefits, risks, side-effects, alternatives, likelihood of achieving goals, and potential problems during recovery for the procedure that I have provided informed consent. Date  Time: 06/30/2021 10:05 AM  Pre-Procedure Preparation:  Monitoring: As per clinic protocol. Respiration, ETCO2, SpO2, BP, heart rate and rhythm monitor placed and checked for adequate function Safety Precautions: Patient was assessed for positional comfort and pressure points before starting the procedure. Time-out: I initiated and conducted the "Time-out" before starting the procedure, as per protocol. The patient was asked to participate by confirming the accuracy of the "Time Out" information. Verification of the correct person, site, and procedure were performed and confirmed by me, the nursing staff, and the patient. "Time-out" conducted as per Joint Commission's Universal Protocol (UP.01.01.01). Time: 1056  Description of Procedure:          Laterality: Bilateral. The procedure was performed in identical fashion on both sides. Levels:  L2, L3, L4, L5, & S1 Medial Branch Level(s) Area Prepped: Posterior Lumbosacral Region DuraPrep (Iodine Povacrylex [0.7% available iodine] and Isopropyl Alcohol, 74% w/w) Safety Precautions: Aspiration looking for blood return was conducted prior to all injections.  At no point did we inject any substances, as a needle was being advanced. Before injecting, the patient was told to immediately notify me if he was experiencing any new onset of "ringing in the ears, or metallic taste in the mouth". No attempts were made at seeking any paresthesias. Safe injection practices and needle disposal techniques used. Medications properly checked  for expiration dates. SDV (single dose vial) medications used. After the completion of the procedure, all disposable equipment used was discarded in the proper designated medical waste containers. Local Anesthesia: Protocol guidelines were followed. The patient was positioned over the fluoroscopy table. The area was prepped in the usual manner. The time-out was completed. The target area was identified using fluoroscopy. A 12-in long, straight, sterile hemostat was used with fluoroscopic guidance to locate the targets for each level blocked. Once located, the skin was marked with an approved surgical skin marker. Once all sites were marked, the skin (epidermis, dermis, and hypodermis), as well as deeper tissues (fat, connective tissue and muscle) were infiltrated with a small amount of a short-acting local anesthetic, loaded on a 10cc syringe with a 25G, 1.5-in  Needle. An appropriate amount of time was allowed for local anesthetics to take effect before proceeding to the next step. Local Anesthetic: Lidocaine 2.0% The unused portion of the local anesthetic was discarded in the proper designated containers. Technical explanation of process:  L2 Medial Branch Nerve Block (MBB): The target area for the L2 medial branch is at the junction of the postero-lateral aspect of the superior articular process and the superior, posterior, and medial edge of the transverse process of L3. Under fluoroscopic guidance, a Quincke needle was inserted until contact was made with os over the superior postero-lateral aspect of the pedicular shadow (target area).  After negative aspiration for blood, 0.5 mL of the nerve block solution was injected without difficulty or complication. The needle was removed intact. L3 Medial Branch Nerve Block (MBB): The target area for the L3 medial branch is at the junction of the postero-lateral aspect of the superior articular process and the superior, posterior, and medial edge of the transverse process of L4. Under fluoroscopic guidance, a Quincke needle was inserted until contact was made with os over the superior postero-lateral aspect of the pedicular shadow (target area). After negative aspiration for blood, 0.5 mL of the nerve block solution was injected without difficulty or complication. The needle was removed intact. L4 Medial Branch Nerve Block (MBB): The target area for the L4 medial branch is at the junction of the postero-lateral aspect of the superior articular process and the superior, posterior, and medial edge of the transverse process of L5. Under fluoroscopic guidance, a Quincke needle was inserted until contact was made with os over the superior postero-lateral aspect of the pedicular shadow (target area). After negative aspiration for blood, 0.5 mL of the nerve block solution was injected without difficulty or complication. The needle was removed intact. L5 Medial Branch Nerve Block (MBB): The target area for the L5 medial branch is at the junction of the postero-lateral aspect of the superior articular process and the superior, posterior, and medial edge of the sacral ala. Under fluoroscopic guidance, a Quincke needle was inserted until contact was made with os over the superior postero-lateral aspect of the pedicular shadow (target area). After negative aspiration for blood, 0.5 mL of the nerve block solution was injected without difficulty or complication. The needle was removed intact. S1 Medial Branch Nerve Block (MBB): The target area for the S1 medial branch is at the posterior and inferior 6 o'clock  position of the L5-S1 facet joint. Under fluoroscopic guidance, the Quincke needle inserted for the L5 MBB was redirected until contact was made with os over the inferior and postero aspect of the sacrum, at the 6 o' clock position under the L5-S1 facet joint (Target area). After negative  aspiration for blood, 0.5 mL of the nerve block solution was injected without difficulty or complication. The needle was removed intact.  Nerve block solution: 0.2% PF-Ropivacaine + Triamcinolone (40 mg/mL) diluted to a final concentration of 4 mg of Triamcinolone/mL of Ropivacaine The unused portion of the solution was discarded in the proper designated containers. Procedural Needles: 22-gauge, 3.5-inch, Quincke needles used for all levels.  Once the entire procedure was completed, the treated area was cleaned, making sure to leave some of the prepping solution back to take advantage of its long term bactericidal properties.      Illustration of the posterior view of the lumbar spine and the posterior neural structures. Laminae of L2 through S1 are labeled. DPRL5, dorsal primary ramus of L5; DPRS1, dorsal primary ramus of S1; DPR3, dorsal primary ramus of L3; FJ, facet (zygapophyseal) joint L3-L4; I, inferior articular process of L4; LB1, lateral branch of dorsal primary ramus of L1; IAB, inferior articular branches from L3 medial branch (supplies L4-L5 facet joint); IBP, intermediate branch plexus; MB3, medial branch of dorsal primary ramus of L3; NR3, third lumbar nerve root; S, superior articular process of L5; SAB, superior articular branches from L4 (supplies L4-5 facet joint also); TP3, transverse process of L3.  Vitals:   06/30/21 1055 06/30/21 1100 06/30/21 1105 06/30/21 1107  BP: (!) 158/96 (!) 158/97 (!) 158/97 (!) 160/106  Pulse:      Resp: Temp:      TempSrc:      SpO2: 100% 100% 100% 100%  Weight:      Height:         Start Time: 1056 hrs. End Time: 1107 hrs.  Imaging Guidance  (Spinal):          Type of Imaging Technique: Fluoroscopy Guidance (Spinal) Indication(s): Assistance in needle guidance and placement for procedures requiring needle placement in or near specific anatomical locations not easily accessible without such assistance. Exposure Time: Please see nurses notes. Contrast: None used. Fluoroscopic Guidance: I was personally present during the use of fluoroscopy. "Tunnel Vision Technique" used to obtain the best possible view of the target area. Parallax error corrected before commencing the procedure. "Direction-depth-direction" technique used to introduce the needle under continuous pulsed fluoroscopy. Once target was reached, antero-posterior, oblique, and lateral fluoroscopic projection used confirm needle placement in all planes. Images permanently stored in EMR. Interpretation: No contrast injected. I personally interpreted the imaging intraoperatively. Adequate needle placement confirmed in multiple planes. Permanent images saved into the patient's record.  Antibiotic Prophylaxis:   Anti-infectives (From admission, onward)    None      Indication(s): None identified  Post-operative Assessment:  Post-procedure Vital Signs:  Pulse/HCG Rate: 6372 Temp: (!) 97 F (36.1 C) Resp: 18 BP: (!) 160/106 SpO2: 100 %  EBL: None  Complications: No immediate post-treatment complications observed by team, or reported by patient.  Note: The patient tolerated the entire procedure well. A repeat set of vitals were taken after the procedure and the patient was kept under observation following institutional policy, for this type of procedure. Post-procedural neurological assessment was performed, showing return to baseline, prior to discharge. The patient was provided with post-procedure discharge instructions, including a section on how to identify potential problems. Should any problems arise concerning this procedure, the patient was given instructions to  immediately contact us, at any time, without hesitation. In any case, we plan to contact the patient by telephone for a follow-up status report regarding this interventional procedure.  Comments:  No additional relevant information.  Plan of Care  Orders:  Orders Placed This Encounter  Procedures   LUMBAR FACET(MEDIAL BRANCH NERVE BLOCK) MBNB    Scheduling Instructions:     Procedure: Lumbar facet block (AKA.: Lumbosacral medial branch nerve block)     Side: Bilateral     Level: L3-4, L4-5, & L5-S1 Facets (L2, L3, L4, L5, & S1 Medial Branch Nerves)     Sedation: Patient's choice.     Timeframe: Today    Order Specific Question:   Where will this procedure be performed?    Answer:   [facility]   DG PA[facility] C-ARM 1-60 MIN NO REPORT    Intraoperative interpretation by procedural physician at [facility].    Standing Status:   Standing    Number of Occurrences:   1    Order Specific Question:   Reason for exam:    Answer:   Assistance in needle guidance and placement for procedures requiring needle placement in or near specific anatomical locations not easily accessible without such assistance.   Informed Consent Details: Physician/Practitioner Attestation; Transcribe to consent form and obtain patient signature    Nursing Order: Transcribe to consent form and obtain patient signature. Note: Always confirm laterality of pain with Mr. Mcday, before procedure.    Order Specific Question:   Physician/Practitioner attestation of informed consent for procedure/surgical case    Answer:   I, the physician/practitioner, attest that I have discussed with the patient the benefits, risks, side effects, alternatives, likelihood of achieving goals and potential problems during recovery for the procedure that I have provided informed consent.    Order Specific Question:   Procedure    Answer:   Lumbar Facet Block  under fluoroscopic guidance    Order Specific Question:    Physician/Practitioner performing the procedure    Answer:   Keely Drennan A. Laban Emperor MD    Order Specific Question:   Indication/Reason    Answer:   Low Back Pain, with our without leg pain, due to Facet Joint Arthralgia (Joint Pain) Spondylosis (Arthritis of the Spine), without myelopathy or radiculopathy (Nerve Damage).   Provide equipment / supplies at bedside    "Block Tray" (Disposable  single use) Needle type: SpinalSpinal Amount/quantity: 4 Size: Regular (3.5-inch) Gauge: 22G    Standing Status:   Standing    Number of Occurrences:   1    Order Specific Question:   Specify    Answer:   Block Tray    Chronic Opioid Analgesic:  Tramadol 50 mg, 2 tabs PO q 6 hrs (400 mg/day of tramadol) (LAST UDS DONE 02/21/2020. Scanned wrong into system) MME/day: 40 mg/day.   Medications ordered for procedure: Meds ordered this encounter  Medications   lidocaine (XYLOCAINE) 2 % (with pres) injection 400 mg   pentafluoroprop-tetrafluoroeth (GEBAUERS) aerosol   ropivacaine (PF) 2 mg/mL (0.2%) (NAROPIN) injection 18 mL   triamcinolone acetonide (KENALOG-40) injection 80 mg    Medications administered: We administered lidocaine, pentafluoroprop-tetrafluoroeth, ropivacaine (PF) 2 mg/mL (0.2%), and triamcinolone acetonide.  See the medical record for exact dosing, route, and time of administration.  Follow-up plan:   Return in about 2 weeks (around 07/14/2021) for Proc-day (T,Th), (VV), (PPE).       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 Volanda Napoleon, MD) for cervical instability (11/11/2020)    Therapeutic  Palliative (PRN) options:   Palliative left L3-4 LESI #3  Palliative left L3 TFESI #2  Palliative left L4 TFESI #3  Palliative right L3 TFESI #3  Palliative right L4 TFESI #4        Recent Visits Date Type Provider Dept  06/16/21 Office Visit Delano Metz, MD [facility]  Showing recent visits within past 90 days and meeting all other requirements Today's Visits Date Type Provider Dept  06/30/21 Procedure visit Delano Metz, MD [facility]  Showing today's visits and meeting all other requirements Future Appointments Date Type Provider Dept  07/16/21 Appointment Delano Metz, MD [facility]  Showing future appointments within next 90 days and meeting all other requirements Disposition: Discharge home  Discharge (Date  Time): 06/30/2021;   hrs.   Primary Care Physician: Duffy, Barnabas Lister, MD Location: [facility] Note by: Oswaldo Done, MD Date: 06/30/2021; Time: 12:07 PM  Disclaimer:  Medicine is not an Visual merchandiser. The only guarantee in medicine is that nothing is guaranteed. It is important to note that the decision to proceed with this intervention was based on the information collected from the patient. The Data and conclusions were drawn from the patient's questionnaire, the interview, and the physical examination. Because the information was provided in large part by the patient, it cannot be guaranteed that it has not been purposely or unconsciously manipulated. Every effort has been made to obtain as much relevant data as possible for this evaluation. It is important to note that the conclusions that lead to this procedure are derived in large part from the available data. Always take into account that the treatment will also be dependent on  availability of resources and existing treatment guidelines, considered by other Pain Management Practitioners as being common knowledge and practice, at the time of the intervention. For Medico-Legal purposes, it is also important to point out that variation in procedural techniques and pharmacological choices are the acceptable norm. The indications, contraindications, technique, and results of the above procedure should only be interpreted and judged by a Board-Certified Interventional Pain Specialist with extensive familiarity and expertise in the same exact procedure and technique.

## 2021-06-30 ENCOUNTER — Ambulatory Visit
Admission: RE | Admit: 2021-06-30 | Discharge: 2021-06-30 | Disposition: A | Discharge status: Home / Self Care | Payer: Medicare HMO | Source: Ambulatory Visit | Attending: Pain Medicine | Admitting: Pain Medicine

## 2021-06-30 ENCOUNTER — Other Ambulatory Visit: Payer: Self-pay

## 2021-06-30 ENCOUNTER — Encounter: Payer: Self-pay | Admitting: Pain Medicine

## 2021-06-30 ENCOUNTER — Ambulatory Visit (HOSPITAL_BASED_OUTPATIENT_CLINIC_OR_DEPARTMENT_OTHER): Payer: Medicare HMO | Admitting: Pain Medicine

## 2021-06-30 VITALS — BP 160/106 | HR 63 | Temp 97.0°F | Resp 18 | Ht 72.0 in | Wt 180.0 lb

## 2021-06-30 DIAGNOSIS — M4310 Spondylolisthesis, site unspecified: Secondary | ICD-10-CM | POA: Insufficient documentation

## 2021-06-30 DIAGNOSIS — M47817 Spondylosis without myelopathy or radiculopathy, lumbosacral region: Secondary | ICD-10-CM | POA: Insufficient documentation

## 2021-06-30 DIAGNOSIS — G8929 Other chronic pain: Secondary | ICD-10-CM | POA: Insufficient documentation

## 2021-06-30 DIAGNOSIS — M47816 Spondylosis without myelopathy or radiculopathy, lumbar region: Secondary | ICD-10-CM | POA: Insufficient documentation

## 2021-06-30 DIAGNOSIS — M545 Low back pain, unspecified: Secondary | ICD-10-CM | POA: Diagnosis present

## 2021-06-30 DIAGNOSIS — M5137 Other intervertebral disc degeneration, lumbosacral region: Secondary | ICD-10-CM | POA: Diagnosis present

## 2021-06-30 MED ORDER — TRIAMCINOLONE ACETONIDE 40 MG/ML IJ SUSP
INTRAMUSCULAR | Status: AC
  Filled 2021-06-30: qty 2

## 2021-06-30 MED ORDER — TRIAMCINOLONE ACETONIDE 40 MG/ML IJ SUSP
80.0000 mg | Freq: Once | INTRAMUSCULAR | Status: AC
  Administered 2021-06-30: 80 mg

## 2021-06-30 MED ORDER — PENTAFLUOROPROP-TETRAFLUOROETH EX AERO
INHALATION_SPRAY | Freq: Once | CUTANEOUS | Status: AC
  Administered 2021-06-30: 30 via TOPICAL
  Filled 2021-06-30: qty 116

## 2021-06-30 MED ORDER — LIDOCAINE HCL 2 % IJ SOLN
INTRAMUSCULAR | Status: AC
  Filled 2021-06-30: qty 20

## 2021-06-30 MED ORDER — LIDOCAINE HCL 2 % IJ SOLN
20.0000 mL | Freq: Once | INTRAMUSCULAR | Status: AC
  Administered 2021-06-30: 400 mg

## 2021-06-30 MED ORDER — ROPIVACAINE HCL 2 MG/ML IJ SOLN
INTRAMUSCULAR | Status: AC
  Filled 2021-06-30: qty 20

## 2021-06-30 MED ORDER — ROPIVACAINE HCL 2 MG/ML IJ SOLN
18.0000 mL | Freq: Once | INTRAMUSCULAR | Status: AC
  Administered 2021-06-30: 18 mL via PERINEURAL

## 2021-06-30 NOTE — Progress Notes (Signed)
Safety precautions to be maintained throughout the outpatient stay will include: orient to surroundings, keep bed in low position, maintain call bell within reach at all times, provide assistance with transfer out of bed and ambulation.  

## 2021-06-30 NOTE — Patient Instructions (Signed)
Pain Management Discharge Instructions  General Discharge Instructions :  If you need to reach your doctor call: Monday-Friday 8:00 am - 4:00 pm at [phone] or toll free [phone].  After clinic hours [phone] to have operator reach doctor.  Bring all of your medication bottles to all your appointments in the pain clinic.  To cancel or reschedule your appointment with Pain Management please remember to call 24 hours in advance to avoid a fee.  Refer to the educational materials which you have been given on: General Risks, I had my Procedure. Discharge Instructions, Post Sedation.  Post Procedure Instructions:  The drugs you were given will stay in your system until tomorrow, so for the next 24 hours you should not drive, make any legal decisions or drink any alcoholic beverages.  You may eat anything you prefer, but it is better to start with liquids then soups and crackers, and gradually work up to solid foods.  Please notify your doctor immediately if you have any unusual bleeding, trouble breathing or pain that is not related to your normal pain.  Depending on the type of procedure that was done, some parts of your body may feel week and/or numb.  This usually clears up by tonight or the next day.  Walk with the use of an assistive device or accompanied by an adult for the 24 hours.  You may use ice on the affected area for the first 24 hours.  Put ice in a Ziploc bag and cover with a towel and place against area 15 minutes on 15 minutes off.  You may switch to heat after 24 hours.Facet Blocks Patient Information  Description: The facets are joints in the spine between the vertebrae.  Like any joints in the body, facets can become irritated and painful.  Arthritis can also effect the facets.  By injecting steroids and local anesthetic in and around these joints, we can temporarily block the nerve supply to them.  Steroids act directly on irritated nerves and tissues to  reduce selling and inflammation which often leads to decreased pain.  Facet blocks may be done anywhere along the spine from the neck to the low back depending upon the location of your pain.   After numbing the skin with local anesthetic (like Novocaine), a small needle is passed onto the facet joints under x-ray guidance.  You may experience a sensation of pressure while this is being done.  The entire block usually lasts about 15-25 minutes.   Conditions which may be treated by facet blocks:  Low back/buttock pain Neck/shoulder pain Certain types of headaches  Preparation for the injection:  Do not eat any solid food or dairy products within 8 hours of your appointment. You may drink clear liquid up to 3 hours before appointment.  Clear liquids include water, black coffee, juice or soda.  No milk or cream please. You may take your regular medication, including pain medications, with a sip of water before your appointment.  Diabetics should hold regular insulin (if taken separately) and take 1/2 normal NPH dose the morning of the procedure.  Carry some sugar containing items with you to your appointment. A driver must accompany you and be prepared to drive you home after your procedure. Bring all your current medications with you. An IV may be inserted and sedation may be given at the discretion of the physician. A blood pressure cuff, EKG and other monitors will often be applied during the procedure.  Some patients may need to   have extra oxygen administered for a short period. You will be asked to provide medical information, including your allergies and medications, prior to the procedure.  We must know immediately if you are taking blood thinners (like Coumadin/Warfarin) or if you are allergic to IV iodine contrast (dye).  We must know if you could possible be pregnant.  Possible side-effects:  Bleeding from needle site Infection (rare, may require surgery) Nerve injury (rare) Numbness  & tingling (temporary) Difficulty urinating (rare, temporary) Spinal headache (a headache worse with upright posture) Light-headedness (temporary) Pain at injection site (serveral days) Decreased blood pressure (rare, temporary) Weakness in arm/leg (temporary) Pressure sensation in back/neck (temporary)   Call if you experience:  Fever/chills associated with headache or increased back/neck pain Headache worsened by an upright position New onset, weakness or numbness of an extremity below the injection site Hives or difficulty breathing (go to the emergency room) Inflammation or drainage at the injection site(s) Severe back/neck pain greater than usual New symptoms which are concerning to you  Please note:  Although the local anesthetic injected can often make your back or neck feel good for several hours after the injection, the pain will likely return. It takes 3-7 days for steroids to work.  You may not notice any pain relief for at least one week.  If effective, we will often do a series of 2-3 injections spaced 3-6 weeks apart to maximally decrease your pain.  After the initial series, you may be a candidate for a more permanent nerve block of the facets.  If you have any questions, please call [phone] [facility] 

## 2021-07-01 ENCOUNTER — Telehealth: Payer: Self-pay

## 2021-07-01 NOTE — Telephone Encounter (Signed)
Post procedure phone call. Patient states he is doing well.  

## 2021-07-16 ENCOUNTER — Encounter: Payer: Self-pay | Admitting: Emergency Medicine

## 2021-07-16 ENCOUNTER — Other Ambulatory Visit: Payer: Self-pay

## 2021-07-16 ENCOUNTER — Ambulatory Visit: Payer: Medicare HMO | Attending: Pain Medicine | Admitting: Pain Medicine

## 2021-07-16 ENCOUNTER — Emergency Department
Admission: EM | Admit: 2021-07-16 | Discharge: 2021-07-16 | Disposition: A | Discharge status: Home / Self Care | Payer: Medicare HMO | Attending: Emergency Medicine | Admitting: Emergency Medicine

## 2021-07-16 DIAGNOSIS — N183 Chronic kidney disease, stage 3 unspecified: Secondary | ICD-10-CM | POA: Insufficient documentation

## 2021-07-16 DIAGNOSIS — M25552 Pain in left hip: Secondary | ICD-10-CM

## 2021-07-16 DIAGNOSIS — I129 Hypertensive chronic kidney disease with stage 1 through stage 4 chronic kidney disease, or unspecified chronic kidney disease: Secondary | ICD-10-CM | POA: Insufficient documentation

## 2021-07-16 DIAGNOSIS — Z79899 Other long term (current) drug therapy: Secondary | ICD-10-CM | POA: Insufficient documentation

## 2021-07-16 DIAGNOSIS — M4310 Spondylolisthesis, site unspecified: Secondary | ICD-10-CM | POA: Diagnosis not present

## 2021-07-16 DIAGNOSIS — S0591XA Unspecified injury of right eye and orbit, initial encounter: Secondary | ICD-10-CM | POA: Diagnosis present

## 2021-07-16 DIAGNOSIS — Z87891 Personal history of nicotine dependence: Secondary | ICD-10-CM | POA: Insufficient documentation

## 2021-07-16 DIAGNOSIS — M47816 Spondylosis without myelopathy or radiculopathy, lumbar region: Secondary | ICD-10-CM | POA: Diagnosis not present

## 2021-07-16 DIAGNOSIS — M545 Low back pain, unspecified: Secondary | ICD-10-CM | POA: Diagnosis not present

## 2021-07-16 DIAGNOSIS — S0501XA Injury of conjunctiva and corneal abrasion without foreign body, right eye, initial encounter: Secondary | ICD-10-CM | POA: Insufficient documentation

## 2021-07-16 DIAGNOSIS — X58XXXA Exposure to other specified factors, initial encounter: Secondary | ICD-10-CM | POA: Diagnosis not present

## 2021-07-16 DIAGNOSIS — M25551 Pain in right hip: Secondary | ICD-10-CM | POA: Diagnosis not present

## 2021-07-16 DIAGNOSIS — G8929 Other chronic pain: Secondary | ICD-10-CM

## 2021-07-16 MED ORDER — TETRACAINE HCL 0.5 % OP SOLN
2.0000 [drp] | Freq: Once | OPHTHALMIC | Status: AC
  Administered 2021-07-16: 2 [drp] via OPHTHALMIC
  Filled 2021-07-16: qty 4

## 2021-07-16 MED ORDER — ERYTHROMYCIN 5 MG/GM OP OINT: 1.0000 "application " | TOPICAL_OINTMENT | Freq: Four times a day (QID) | OPHTHALMIC | 0 refills | Status: AC

## 2021-07-16 MED ORDER — FLUORESCEIN SODIUM 1 MG OP STRP
1.0000 | ORAL_STRIP | Freq: Once | OPHTHALMIC | Status: AC
  Administered 2021-07-16: 1 via OPHTHALMIC
  Filled 2021-07-16: qty 1

## 2021-07-16 NOTE — ED Triage Notes (Signed)
C/O using wire brush on the drill yesterday and c/o right eye pain.

## 2021-07-16 NOTE — Progress Notes (Signed)
Patient: Caleb Edwards  Service Category: E/M  Provider: Gaspar Cola, MD  DOB: 02-16-1951  DOS: 07/16/2021  Location: Office  MRN: [id number]  Setting: Ambulatory outpatient  Referring Provider: Cherylann Parr, MD  Type: Established Patient  Specialty: Interventional Pain Management  PCP: Cherylann Parr, MD  Location: Remote location  Delivery: TeleHealth     Virtual Encounter - Pain Management PROVIDER NOTE: Information contained herein reflects review and annotations entered in association with encounter. Interpretation of such information and data should be left to medically-trained personnel. Information provided to patient can be located elsewhere in the medical record under "Patient Instructions". Document created using STT-dictation technology, any transcriptional errors that may result from process are unintentional.    Contact & Pharmacy Preferred: [phone] Home: [phone] (home) Mobile: [phone] (mobile) E-mail: [email or web address]  [facility] [address]), [place] ([place]) [place] [address] Phone: [phone] Fax: [phone]   Pre-screening  Caleb Edwards offered "in-person" vs "virtual" encounter. He indicated preferring virtual for this encounter.   Reason COVID-19*  Social distancing based on CDC and AMA recommendations.   I contacted Caleb Edwards on 07/16/2021 via telephone.      I clearly identified myself as Gaspar Cola, MD. I verified that I was speaking with the correct person using two identifiers (Name: Caleb Edwards, and date of birth: 24-Aug-1951).  Consent I sought verbal advanced consent from Caleb Edwards for virtual visit interactions. I informed Caleb Edwards of possible security and privacy concerns, risks, and limitations associated with providing "not-in-person" medical evaluation and management services. I also informed Caleb Edwards of the availability of  "in-person" appointments. Finally, I informed him that there would be a charge for the virtual visit and that he could be  personally, fully or partially, financially responsible for it. Caleb Edwards expressed understanding and agreed to proceed.   Historic Elements   Caleb Edwards is a 70 y.o. year old, male patient evaluated today after our last contact on 06/30/2021. Mr. Mcbryar  has a past medical history of Back pain, Blood transfusion without reported diagnosis (12/2015), Diverticulitis, Hypertension, and Ulcer, stomach peptic, chronic. He also  has a past surgical history that includes Esophagogastroduodenoscopy (egd) with propofol (N/A, 01/03/2016); Colonoscopy with propofol (N/A, 08/02/2017); Esophagogastroduodenoscopy (egd) with propofol (08/02/2017); Esophagogastroduodenoscopy (egd) with propofol (N/A, 02/05/2018); Colonoscopy with propofol (N/A, 02/05/2018); Esophagogastroduodenoscopy (egd) with propofol (N/A, 03/30/2018); Esophagogastroduodenoscopy (egd) with propofol (N/A, 08/09/2019); Colonoscopy with propofol (N/A, 08/09/2019); Givens capsule study (N/A, 04/24/2020); and Esophagogastroduodenoscopy (egd) with propofol (N/A, 04/24/2020). Caleb Edwards has a current medication list which includes the following prescription(s): amlodipine, atorvastatin, chlorthalidone, ferrous sulfate, lisinopril, pantoprazole, polyethylene glycol, sildenafil, tramadol, trazodone, and erythromycin. He  reports that he has quit smoking. He has never used smokeless tobacco. He reports current alcohol use of about 1.0 standard drink per week. He reports that he does not use drugs. Caleb Edwards has No Known Allergies.   HPI  Today, he is being contacted for a post-procedure assessment.  Caleb Edwards seems to be doing great after his diagnostic bilateral lumbar facet block were he is currently enjoying 100% relief of his pain.  Hopefully this will last for a long time but if it does not, we have already confirmed that his pain is  coming from the lumbar facets and therefore we could consider the possibility of doing a second diagnostic injection and if again he gets good benefit, but it does  not last, we will definitely consider the possibility of radiofrequency ablation.  This plan was shared with the patient who understood and accepted.  Post-Procedure Evaluation  Procedure (06/30/2021):  Procedure:           Anesthesia, Analgesia, Anxiolysis:  Type: Lumbar Facet, Medial Branch Block(s)          Primary Purpose: Diagnostic Region: Posterolateral Lumbosacral Spine Level: L2, L3, L4, L5, & S1 Medial Branch Level(s). Injecting these levels blocks the L3-4, L4-5, and L5-S1 lumbar facet joints. Laterality: Bilateral   Type: Local Anesthesia Local Anesthetic: Lidocaine 1-2% Sedation: Minimal Anxiolysis  Indication(s): Anxiety & Analgesia Route: Infiltration (Halliday/IM) IV Access: Available     Position: Prone    Indications: 1. Lumbar facet syndrome (Bilateral) (R>L)   2. Spondylosis without myelopathy or radiculopathy, lumbosacral region   3. Lumbar facet arthropathy   4. Grade 1 Anterolisthesis (7-65m) of L5 over S1   5. DDD (degenerative disc disease), lumbosacral   6. Chronic low back pain (1ry area of Pain) (Bilateral) (R>L) w/o sciatica     Pain Score: Pre-procedure: 8 /10 Post-procedure: 4 /10   Anxiolysis: none.  Effectiveness during initial hour after procedure (Ultra-Short Term Relief): 100 %.  Local anesthetic used: Long-acting (4-6 hours) Effectiveness: Defined as any analgesic benefit obtained secondary to the administration of local anesthetics. This carries significant diagnostic value as to the etiological location, or anatomical origin, of the pain. Duration of benefit is expected to coincide with the duration of the local anesthetic used.  Effectiveness during initial 4-6 hours after procedure (Short-Term Relief): 100 %.  Long-term benefit: Defined as any relief past the pharmacologic duration of  the local anesthetics.  Effectiveness past the initial 6 hours after procedure (Long-Term Relief): 100 %.  Benefits, current: Defined as benefit present at the time of this evaluation.   Analgesia: The patient indicates currently enjoying 100% relief of his low back pain.  This seems to be ongoing and he is extremely happy with those results.  This has confirmed that we are dealing with a lumbar facet syndrome.  Should and when the pain returns, we may consider doing a second diagnostic injection and if again he gets similar results, he may be a good candidate for radiofrequency ablation. Function: Mr. BHagoodreports improvement in function ROM: Mr. BReileyreports improvement in ROM  Pharmacotherapy Assessment   Analgesic: Tramadol 50 mg, 2 tabs PO q 6 hrs (400 mg/day of tramadol) (LAST UDS DONE 02/21/2020. Scanned wrong into system) MME/day: 40 mg/day.   Monitoring: [place] PMP: PDMP reviewed during this encounter.       Pharmacotherapy: No side-effects or adverse reactions reported. Compliance: No problems identified. Effectiveness: Clinically acceptable. Plan: Refer to "POC". UDS:  Summary  Date Value Ref Range Status  03/11/2021 Note  Final    Comment:    ==================================================================== [employer]ox[employer] Select 13 (MW) ==================================================================== Test                             Result       Flag       Units  Drug Present and Declared for Prescription Verification   Tramadol                       >3472        EXPECTED   ng/mg creat   O-Desmethyltramadol            >3472  EXPECTED   ng/mg creat   N-Desmethyltramadol            1831         EXPECTED   ng/mg creat    Source of tramadol is a prescription medication. O-desmethyltramadol    and N-desmethyltramadol are expected metabolites of tramadol.  ==================================================================== Test                      Result    Flag    Units      Ref Range   Creatinine              144              mg/dL      >=20 ==================================================================== Declared Medications:  The flagging and interpretation on this report are based on the  following declared medications.  Unexpected results may arise from  inaccuracies in the declared medications.   **Note: The testing scope of this panel includes these medications:   Tramadol (Ultram)   **Note: The testing scope of this panel does not include the  following reported medications:   Amlodipine (Norvasc)  Atorvastatin (Lipitor)  Chlorthalidone (Hygroton)  Iron  Lisinopril (Zestril)  Pantoprazole (Protonix)  Polyethylene Glycol (MiraLAX)  Sildenafil (Viagra)  Trazodone (Desyrel) ==================================================================== For clinical consultation, please call [phone]. ====================================================================      Laboratory Chemistry Profile   Renal Lab Results  Component Value Date   BUN 18 02/21/2020   CREATININE 1.24 02/21/2020   GFRAA >60 02/21/2020   GFRNONAA 60 (L) 02/21/2020    Hepatic Lab Results  Component Value Date   AST 23 08/09/2019   ALT 15 08/09/2019   ALBUMIN 3.8 08/09/2019   ALKPHOS 53 08/09/2019   LIPASE 17 08/08/2019    Electrolytes Lab Results  Component Value Date   NA 138 08/09/2019   K 3.9 08/09/2019   CL 106 08/09/2019   CALCIUM 8.7 (L) 08/09/2019   MG 1.9 01/04/2016    Bone Lab Results  Component Value Date   VD25OH 24.0 (L) 12/26/2015    Inflammation (CRP: Acute Phase) (ESR: Chronic Phase) Lab Results  Component Value Date   CRP 0.6 12/26/2015   ESRSEDRATE 3 12/26/2015         Note: Above Lab results reviewed.  Imaging  DG PAIN CLINIC C-ARM 1-60 MIN NO REPORT Fluoro was used, but no Radiologist interpretation will be provided.  Please refer to "NOTES" tab for provider progress note.  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 Diagnoses of Lumbar facet arthropathy, Grade 1 Anterolisthesis (7-47m) of L5 over S1, Chronic low back pain (1ry area of Pain) (Bilateral) (R>L) w/o sciatica, and Chronic hip pain (2ry area of Pain) (Bilateral) (R>L) were also pertinent to this visit.  Plan of Care  Problem-specific:  No problem-specific Assessment & Plan notes found for this encounter.  Mr. WMIHCAEL LEDEEhas a current medication list which includes the following long-term medication(s): atorvastatin, chlorthalidone, lisinopril, pantoprazole, tramadol, and trazodone.  Pharmacotherapy (Medications Ordered): No orders of the defined types were placed in this encounter.  Orders:  No orders of the defined types were placed in this encounter.  Follow-up plan:   Return for scheduled encounter.     Interventional Therapies  Risk  Complexity Considerations:   CKD HTN   Planned  Pending:   Pending further evaluation   Under consideration:   Diagnostic right IA hip joint injection  Diagnostic bilateral  lumbar facet block  Possible bilateral lumbar facet RFA    Completed:   Diagnostic left cervical ESI x1 (10/28/2020)  Therapeutic left L3-4 LESI x2 (06/13/2018)  Therapeutic left L3 TFESI x1 (03/13/2020)  Therapeutic left L4 TFESI x2 (03/13/2020)  Therapeutic right L3 TFESI x2 (03/13/2020)  Therapeutic right L4 TFESI x3 (03/13/2020)  Referral to neurosurgery Elayne Guerin, MD) for cervical instability (11/11/2020)    Therapeutic  Palliative (PRN) options:   Palliative left L3-4 LESI #3  Palliative left L3 TFESI #2  Palliative left L4 TFESI #3  Palliative right L3 TFESI #3  Palliative right L4 TFESI #4         Recent Visits Date Type Provider Dept  06/30/21 Procedure visit Milinda Pointer, MD [facility]  06/16/21 Office Visit Milinda Pointer, MD [facility]  Showing recent visits within past 90 days and meeting all  other requirements Today's Visits Date Type Provider Dept  07/16/21 Office Visit Milinda Pointer, MD [facility]  Showing today's visits and meeting all other requirements Future Appointments No visits were found meeting these conditions. Showing future appointments within next 90 days and meeting all other requirements I discussed the assessment and treatment plan with the patient. The patient was provided an opportunity to ask questions and all were answered. The patient agreed with the plan and demonstrated an understanding of the instructions.  Patient advised to call back or seek an in-person evaluation if the symptoms or condition worsens.  Duration of encounter: 13 minutes.  Note by: Gaspar Cola, MD Date: 07/16/2021; Time: 4:32 PM

## 2021-07-16 NOTE — ED Notes (Signed)
See triage note presents with something under right eye lid   right eye  is red and sl irritated

## 2021-07-16 NOTE — ED Provider Notes (Signed)
[facility] Emergency Department Provider Note ____________________________________________  Time seen: Approximately 10:20 AM  I have reviewed the triage vital signs and the nursing notes.   HISTORY  Chief Complaint Eye Problem   HPI Caleb Edwards is a 70 y.o. male presents to the emergency department for treatment and evaluation of right irritation and sensation of retained foreign body.  He states that yesterday he was using a wire brush connected to his drill and believes that something may have gotten into his eye.  He has a burning sensation and feeling that something is under his eyelid.  He was able to remove a small black fleck this morning but continued to have the eye irritation and decided to come for evaluation.  Past Medical History:  Diagnosis Date   Back pain    Blood transfusion without reported diagnosis 12/2015   bleeding ulcer hospitalization.    Diverticulitis    Hypertension    Ulcer, stomach peptic, chronic     Patient Active Problem List   Diagnosis Date Noted   Spondylosis without myelopathy or radiculopathy, lumbosacral region 06/30/2021   Falls, sequela 06/16/2021   Chronic use of opiate for therapeutic purpose 03/11/2021   Cervicalgia 10/28/2020   DDD (degenerative disc disease), cervical 10/28/2020   Abnormal x-ray of cervical spine 10/28/2020   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) 10/28/2020   Cervical facet arthropathy (Right: C4-5, C5-6) 10/28/2020   Grade 1 Anterolisthesis of cervical spine (1-2 mm) (C4/C5) 10/28/2020   Cervical foraminal stenosis (Right: C5-C8) (Left: C6-C7) 10/28/2020   Cervical radiculopathy (Left) 09/22/2020   Duodenal stenosis    Absolute anemia    Uncomplicated opioid dependence (HCC) 03/27/2020   Iron deficiency anemia due to chronic blood loss 01/08/2020   Lower GI bleed 08/08/2019   Pharmacologic therapy 06/04/2019   Disorder of skeletal system 06/04/2019   Problems influencing health  status 06/04/2019   Chronic hip pain (2ry area of Pain) (Bilateral) (R>L) 06/04/2019   Grade 1 Anterolisthesis (7-57mm) of L5 over S1 06/13/2018   DDD (degenerative disc disease), lumbosacral 06/13/2018   Lumbar facet arthropathy 06/13/2018   HTN (hypertension) 02/03/2018   CKD (chronic kidney disease), stage III (HCC) 02/03/2018   Rectal bleeding 01/23/2018   Blood in stool    GI bleed 07/31/2017   Acute blood loss anemia 07/31/2017   Abdominal pain 07/31/2017   Near syncope 07/31/2017   Chronic pain syndrome 09/09/2016   Vitamin D insufficiency 01/07/2016   Symptomatic anemia 01/02/2016   Long term current use of opiate analgesic 09/24/2015   Long term prescription opiate use 09/24/2015   Opiate use 09/24/2015   Encounter for therapeutic drug level monitoring 09/24/2015   Encounter for chronic pain management 09/24/2015   Insomnia secondary to chronic pain 09/24/2015   Osteoarthritis 09/24/2015   Chronic low back pain (1ry area of Pain) (Bilateral) (R>L) w/o sciatica 09/24/2015   Lumbar spondylosis 09/24/2015   Chronic lower extremity pain (3ry area of Pain) (Bilateral) (R>L) 09/24/2015   Chronic lumbar radicular pain (Left) (L4 Dermatome) 09/24/2015   Lumbar facet syndrome (Bilateral) (R>L) 09/24/2015   Diffuse myofascial pain syndrome 09/24/2015   L4-5 disc bulge 09/24/2015   Lumbar foraminal stenosis (Bilateral) (L4-5) (L>R) 09/24/2015   Chronic neck pain 09/24/2015   Cervical spondylosis 09/24/2015    Past Surgical History:  Procedure Laterality Date   COLONOSCOPY WITH PROPOFOL N/A 08/02/2017   Procedure: COLONOSCOPY WITH PROPOFOL;  Surgeon: Midge Minium, MD;  Location[facility] ENDOSCOPY;  Service: Endoscopy;  Laterality: N/A;  COLONOSCOPY WITH PROPOFOL N/A 02/05/2018   Procedure: COLONOSCOPY WITH PROPOFOL;  Surgeon: Wyline Mood, MD;  Location: [facility] ENDOSCOPY;  Service: Gastroenterology;  Laterality: N/A;   COLONOSCOPY WITH PROPOFOL N/A 08/09/2019   Procedure: COLONOSCOPY  WITH PROPOFOL;  Surgeon: Toledo, Grajales Nearing, MD;  Location: [facility] ENDOSCOPY;  Service: Gastroenterology;  Laterality: N/A;   ESOPHAGOGASTRODUODENOSCOPY (EGD) WITH PROPOFOL N/A 01/03/2016   Procedure: ESOPHAGOGASTRODUODENOSCOPY (EGD) WITH PROPOFOL;  Surgeon: Scot Jun, MD;  Location: [facility] ENDOSCOPY;  Service: Endoscopy;  Laterality: N/A;   ESOPHAGOGASTRODUODENOSCOPY (EGD) WITH PROPOFOL  08/02/2017   Procedure: ESOPHAGOGASTRODUODENOSCOPY (EGD) WITH PROPOFOL;  Surgeon: Midge Minium, MD;  Location: [facility] ENDOSCOPY;  Service: Endoscopy;;   ESOPHAGOGASTRODUODENOSCOPY (EGD) WITH PROPOFOL N/A 02/05/2018   Procedure: ESOPHAGOGASTRODUODENOSCOPY (EGD) WITH PROPOFOL;  Surgeon: Wyline Mood, MD;  Location: [facility] ENDOSCOPY;  Service: Gastroenterology;  Laterality: N/A;   ESOPHAGOGASTRODUODENOSCOPY (EGD) WITH PROPOFOL N/A 03/30/2018   Procedure: ESOPHAGOGASTRODUODENOSCOPY (EGD) WITH PROPOFOL WITH DILATION;  Surgeon: Wyline Mood, MD;  Location: [facility] ENDOSCOPY;  Service: Gastroenterology;  Laterality: N/A;   ESOPHAGOGASTRODUODENOSCOPY (EGD) WITH PROPOFOL N/A 08/09/2019   Procedure: ESOPHAGOGASTRODUODENOSCOPY (EGD) WITH PROPOFOL;  Surgeon: Toledo, Krzywicki Nearing, MD;  Location: [facility] ENDOSCOPY;  Service: Gastroenterology;  Laterality: N/A;   ESOPHAGOGASTRODUODENOSCOPY (EGD) WITH PROPOFOL N/A 04/24/2020   Procedure: ESOPHAGOGASTRODUODENOSCOPY (EGD) WITH PROPOFOL;  Surgeon: Wyline Mood, MD;  Location: [facility] ENDOSCOPY;  Service: Gastroenterology;  Laterality: N/A;   GIVENS CAPSULE STUDY N/A 04/24/2020   Procedure: GIVENS CAPSULE STUDY;  Surgeon: Wyline Mood, MD;  Location: [facility] ENDOSCOPY;  Service: Gastroenterology;  Laterality: N/A;    Prior to Admission medications   Medication Sig Start Date End Date Taking? Authorizing Provider  erythromycin ophthalmic ointment Place 1 application into the left eye 4 (four) times daily for 5 days. 07/16/21 07/21/21 Yes Genaro Bekker B, FNP  amLODipine (NORVASC) 10 MG tablet Take 1 tablet by  mouth daily. 12/31/17   [provider]  atorvastatin (LIPITOR) 20 MG tablet Take 1 tablet by mouth at bedtime. 12/31/17   [provider]  chlorthalidone (HYGROTON) 50 MG tablet Take 1 tablet by mouth daily. 12/31/17   [provider]  ferrous sulfate 325 (65 FE) MG tablet Take 325 mg by mouth daily with breakfast.    [provider]  lisinopril (PRINIVIL,ZESTRIL) 40 MG tablet Take 1 tablet by mouth daily. 12/31/17   [provider]  pantoprazole (PROTONIX) 40 MG tablet Take 1 tablet (40 mg total) by mouth 2 (two) times daily before a meal. 04/16/21   Wyline Mood, MD  polyethylene glycol [facility]) packet Take 17 g by mouth daily. 02/06/18   Auburn Bilberry, MD  sildenafil (VIAGRA) 100 MG tablet Take 100 mg by mouth daily as needed for erectile dysfunction.    [provider]  traMADol (ULTRAM) 50 MG tablet Take 2 tablets (100 mg total) by mouth every 6 (six) hours as needed for severe pain. Each refill must last 30 days. 06/19/21 12/16/21  Delano Metz, MD  traZODone (DESYREL) 50 MG tablet 50 mg at bedtime as needed. 07/18/20   [provider]    Allergies Patient has no known allergies.  Family History  Problem Relation Age of Onset   Diabetes Mother    Heart disease Father    Prostate cancer Brother    Bladder Cancer Neg Hx    Kidney cancer Neg Hx     Social History Social History   Tobacco Use   Smoking status: Former   Smokeless tobacco: Never  Advertising account planner  Vaping Use: Never used  Substance Use Topics   Alcohol use: Yes    Alcohol/week: 1.0 standard drink    Types: 1 Standard drinks or equivalent per week   Drug use: No    Review of Systems   Constitutional: No fever/chills Eyes: Negative for visual changes.  Positive for pain.  Negative for drainage. Musculoskeletal: Negative for pain. Skin: Negative for rash. Neurological: Negative for headaches, focal weakness or numbness. Allergic: Negative  for seasonal allergies. ____________________________________________  PHYSICAL EXAM:  VITAL SIGNS: ED Triage Vitals  Enc Vitals Group     BP 07/16/21 0843 139/89     Pulse Rate 07/16/21 0843 98     Resp 07/16/21 0843 18     Temp 07/16/21 0843 98 F (36.7 C)     Temp Source 07/16/21 0843 Oral     SpO2 07/16/21 0843 100 %     Weight 07/16/21 0834 179 lb 14.3 oz (81.6 kg)     Height 07/16/21 0834 6' (1.829 m)     Head Circumference --      Peak Flow --      Pain Score 07/16/21 0833 8     Pain Loc --      Pain Edu? --      Excl. in GC? --     Constitutional: Alert and oriented. Well appearing and in no acute distress. Eyes: Visual acuity--see nursing documentation; no globe trauma; Eyelids normal to inspection; Sclera appears anicteric.  Eyelids were inverted. Conjunctiva appears clear; Cornea with punctate abrasion at approximately 11 o'clock position without obvious retained foreign body with magnified and fluorescein stain exam.. Head: Atraumatic. Nose: No congestion/rhinnorhea. Mouth/Throat: Mucous membranes are moist.  Oropharynx non-erythematous. Respiratory: Respirations even and unlabored. Breath sounds clear to auscultation. Musculoskeletal:Normal ROM x 4 extremities. Neurologic:  Normal speech and language. No gross focal neurologic deficits are appreciated. Speech is normal. No gait instability. Skin:  Skin is warm, dry and intact. No rash noted. Psychiatric: Mood and affect are normal. Speech and behavior are normal.  ____________________________________________   LABS (all labs ordered are listed, but only abnormal results are displayed)  Labs Reviewed - No data to display ____________________________________________  EKG  Not indicated ____________________________________________  RADIOLOGY  Not indicated ____________________________________________   PROCEDURES  Procedure(s) performed: None ____________________________________________   INITIAL  IMPRESSION / ASSESSMENT AND PLAN / ED COURSE  70 year old male presenting to the emergency department for treatment and evaluation of right eye irritation.  See HPI for further details.  Punctate corneal abrasion noted on exam.  He will be prescribed erythromycin ointment.  He was encouraged to follow-up with ophthalmology if not improving over the next few days.  For symptoms that change or worsen or for new eye pain or change in vision he is to return to the emergency department.  Pertinent labs & imaging results that were available during my care of the patient were reviewed by me and considered in my medical decision making (see chart for details). ____________________________________________   FINAL CLINICAL IMPRESSION(S) / ED DIAGNOSES  Final diagnoses:  Abrasion of right cornea, initial encounter    Note:  This document was prepared using Dragon voice recognition software and may include unintentional dictation errors.    Chinita Pester, FNP 07/16/21 1548    Chesley Noon, MD 07/18/21 [phone]

## 2021-09-21 ENCOUNTER — Other Ambulatory Visit: Payer: Self-pay | Admitting: Student

## 2021-09-21 DIAGNOSIS — R42 Dizziness and giddiness: Secondary | ICD-10-CM

## 2021-10-01 ENCOUNTER — Ambulatory Visit: Payer: Medicare HMO

## 2021-10-13 ENCOUNTER — Other Ambulatory Visit: Payer: Self-pay

## 2021-10-13 ENCOUNTER — Ambulatory Visit
Admission: RE | Admit: 2021-10-13 | Discharge: 2021-10-13 | Disposition: A | Discharge status: Home / Self Care | Payer: Medicare HMO | Source: Ambulatory Visit | Attending: Student | Admitting: Student

## 2021-10-13 DIAGNOSIS — R42 Dizziness and giddiness: Secondary | ICD-10-CM

## 2021-11-06 ENCOUNTER — Other Ambulatory Visit: Payer: Self-pay | Admitting: Student

## 2021-11-06 DIAGNOSIS — R202 Paresthesia of skin: Secondary | ICD-10-CM

## 2021-11-25 ENCOUNTER — Other Ambulatory Visit: Payer: Self-pay

## 2021-11-25 ENCOUNTER — Ambulatory Visit
Admission: RE | Admit: 2021-11-25 | Discharge: 2021-11-25 | Disposition: A | Discharge status: Home / Self Care | Payer: Medicare HMO | Source: Ambulatory Visit | Attending: Student | Admitting: Student

## 2021-11-25 DIAGNOSIS — R202 Paresthesia of skin: Secondary | ICD-10-CM | POA: Diagnosis not present

## 2021-12-08 NOTE — Progress Notes (Signed)
IncreasePROVIDER NOTE: Information contained herein reflects review and annotations entered in association with encounter. Interpretation of such information and data should be left to medically-trained personnel. Information provided to patient can be located elsewhere in the medical record under "Patient Instructions". Document created using STT-dictation technology, any transcriptional errors that may result from process are unintentional.    Patient: Caleb Edwards  Service Category: E/M  Provider: Gaspar Cola, MD  DOB: 1950-12-27  DOS: 12/09/2021  Specialty: Interventional Pain Management  MRN: [id number]  Setting: Ambulatory outpatient  PCP: Cherylann Parr, MD  Type: Established Patient    Referring Provider: Cherylann Parr, MD  Location: Office  Delivery: Face-to-face     HPI  [name], a 71 y.o. year old male, is here today because of his Chronic pain syndrome [G89.4]. [name] primary complain today is Back Pain (low) Last encounter: My last encounter with him was on 07/16/2021. Pertinent problems: 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; DDD (degenerative disc disease), lumbosacral; Lumbar facet arthropathy; Chronic hip pain (2ry area of Pain) (Bilateral) (R>L); Cervical radiculopathy (Left); Cervicalgia; DDD (degenerative disc disease), cervical; Abnormal x-ray of cervical spine; 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 Pain Assessment: Severity of Chronic pain is reported as a 7 /10. Location: Back Lower/legs, posterior to midcalf. Onset: More than a month ago. Quality: Dull, Throbbing, Constant. Timing: Constant. Modifying factor(s): medications. Vitals:  height is 6' (1.829 m) and weight is 185 lb (83.9 kg). His temporal temperature is 97 F (36.1 C) (abnormal). His blood pressure is 137/89 and his pulse is 64. His respiration is 18 and oxygen saturation is 95%.   Reason for encounter: medication management.   The patient indicates doing well with the current medication regimen. No adverse reactions or side effects reported to the medications.   According to the patient he has been experiencing this neck pain and pain going down the right upper extremity to the area of the ring and pinky finger (C8 dermatomal distribution) with occasional pain in the area of the thumb and index finger (C6 dermatomal distribution).  Review of the patient's medical record show an abnormal cervical MRI and x-rays with evidence of central and foraminal stenosis at multiple levels.  Based on the patient's current clinical symptoms and available imaging, I have offered the patient a right-sided cervical epidural steroid injection under fluoroscopic guidance.  The plan was shared with the patient who understood and accepted.  RTCB: 06/14/2022   Pharmacotherapy Assessment  Analgesic: Tramadol 50 mg, 2 tabs PO q 6 hrs (400 mg/day of tramadol) (LAST UDS DONE 02/21/2020. Scanned wrong into system) MME/day: 40 mg/day.   Monitoring: [place] PMP: PDMP reviewed during this encounter.       Pharmacotherapy: No side-effects or adverse reactions  reported. Compliance: No problems identified. Effectiveness: Clinically acceptable.  WDonneta Romberg Dena L,  RN  12/09/2021  2:59 PM  Signed Nursing Pain Medication Assessment:  Safety precautions to be maintained throughout the outpatient stay will include: orient to surroundings, keep bed in low position, maintain call bell within reach at all times, provide assistance with transfer out of bed and ambulation.  Medication Inspection Compliance: Caleb Edwards did not comply with our request to bring his pills to be counted. He was reminded that bringing the medication bottles, even when empty, is a requirement.  Medication: None brought in. Pill/Patch Count: None available to be counted. Bottle Appearance: No container available. Did not bring bottle(s) to appointment. Filled Date: N/A Last Medication intake:  Today    UDS:  Summary  Date Value Ref Range Status  03/11/2021 Note  Final    Comment:    ==================================================================== [employer] Select 13 (MW) ==================================================================== Test                             Result       Flag       Units  Drug Present and Declared for Prescription Verification   Tramadol                       >3472        EXPECTED   ng/mg creat   O-Desmethyltramadol            >3472        EXPECTED   ng/mg creat   N-Desmethyltramadol            1831         EXPECTED   ng/mg creat    Source of tramadol is a prescription medication. O-desmethyltramadol    and N-desmethyltramadol are expected metabolites of tramadol.  ==================================================================== Test                      Result    Flag   Units      Ref Range   Creatinine              144              mg/dL      >=20 ==================================================================== Declared Medications:  The flagging and interpretation on this report are based on the  following declared medications.   Unexpected results may arise from  inaccuracies in the declared medications.   **Note: The testing scope of this panel includes these medications:   Tramadol (Ultram)   **Note: The testing scope of this panel does not include the  following reported medications:   Amlodipine (Norvasc)  Atorvastatin (Lipitor)  Chlorthalidone (Hygroton)  Iron  Lisinopril (Zestril)  Pantoprazole (Protonix)  Polyethylene Glycol (MiraLAX)  Sildenafil (Viagra)  Trazodone (Desyrel) ==================================================================== For clinical consultation, please call [phone]. ====================================================================      ROS  Constitutional: Denies any fever or chills Gastrointestinal: No reported hemesis, hematochezia, vomiting, or acute GI distress Musculoskeletal: Denies any acute onset joint swelling, redness, loss of ROM, or weakness Neurological: No reported episodes of acute onset apraxia, aphasia, dysarthria, agnosia, amnesia, paralysis, loss of coordination, or loss of consciousness  Medication Review  amLODipine, atorvastatin, chlorthalidone, ferrous sulfate, lisinopril, pantoprazole, polyethylene glycol, sildenafil, traMADol, and traZODone  History Review  Allergy: Caleb Edwards has No Known Allergies. Drug: Caleb Edwards  reports no history of drug use. Alcohol:  reports current alcohol use of about 1.0 standard drink per week. Tobacco:  reports that he has  quit smoking. He has never used smokeless tobacco. Social: Caleb Edwards  reports that he has quit smoking. He has never used smokeless tobacco. He reports current alcohol use of about 1.0 standard drink per week. He reports that he does not use drugs. Medical:  has a past medical history of Back pain, Blood transfusion without reported diagnosis (12/2015), Diverticulitis, Hypertension, and Ulcer, stomach peptic, chronic. Surgical: Caleb Edwards  has a past surgical history that includes  Esophagogastroduodenoscopy (egd) with propofol (N/A, 01/03/2016); Colonoscopy with propofol (N/A, 08/02/2017); Esophagogastroduodenoscopy (egd) with propofol (08/02/2017); Esophagogastroduodenoscopy (egd) with propofol (N/A, 02/05/2018); Colonoscopy with propofol (N/A, 02/05/2018); Esophagogastroduodenoscopy (egd) with propofol (N/A, 03/30/2018); Esophagogastroduodenoscopy (egd) with propofol (N/A, 08/09/2019); Colonoscopy with propofol (N/A, 08/09/2019); Givens capsule study (N/A, 04/24/2020); and Esophagogastroduodenoscopy (egd) with propofol (N/A, 04/24/2020). Family: family history includes Diabetes in his mother; Heart disease in his father; Prostate cancer in his brother.  Laboratory Chemistry Profile   Renal Lab Results  Component Value Date   BUN 18 02/21/2020   CREATININE 1.24 02/21/2020   GFRAA >60 02/21/2020   GFRNONAA 60 (L) 02/21/2020    Hepatic Lab Results  Component Value Date   AST 23 08/09/2019   ALT 15 08/09/2019   ALBUMIN 3.8 08/09/2019   ALKPHOS 53 08/09/2019   LIPASE 17 08/08/2019    Electrolytes Lab Results  Component Value Date   NA 138 08/09/2019   K 3.9 08/09/2019   CL 106 08/09/2019   CALCIUM 8.7 (L) 08/09/2019   MG 1.9 01/04/2016    Bone Lab Results  Component Value Date   VD25OH 24.0 (L) 12/26/2015    Inflammation (CRP: Acute Phase) (ESR: Chronic Phase) Lab Results  Component Value Date   CRP 0.6 12/26/2015   ESRSEDRATE 3 12/26/2015         Note: Above Lab results reviewed.  Recent Imaging Review  CT MAXILLOFACIAL WO CONTRAST CLINICAL DATA:  Headache and dizziness in the frontal sinus area. Pain in the nasal area in the mornings.  EXAM: CT MAXILLOFACIAL WITHOUT CONTRAST  TECHNIQUE: Multidetector CT images of the paranasal sinuses were obtained using the standard protocol without intravenous contrast.  RADIATION DOSE REDUCTION: This exam was performed according to the departmental dose-optimization program which includes automated exposure  control, adjustment of the mA and/or kV according to patient size and/or use of iterative reconstruction technique.  COMPARISON:  Head CT 10/13/2021  FINDINGS: Paranasal sinuses:  Frontal: Normally aerated. Patent frontal sinus drainage pathways.  Ethmoid: Normally aerated.  Maxillary: Normally aerated.  Sphenoid: Complete opacification on the left with atelectasis and sclerotic wall thickening. Internal soft tissue density is contiguous with the nasal cavity where there is polypoid morphology and demineralization of the uncinate process. The polypoid process measures approximately 14 mm on coronal reformats.  Right ostiomeatal unit: Patent.  Left ostiomeatal unit: Completely obstructed maxillary sinus outflow, as above. Narrowing of the middle meatus as well, but no associated ethmoid disease.  Nasal passages: Patent. Intact nasal septum is essentially midline.  Anatomy: No pneumatization superior to anterior ethmoid notches. Sellar sphenoid pneumatization pattern. No dehiscence of carotid or optic canals. No onodi cell.  Other: No incidental finding on soft tissue windows.  IMPRESSION: Focal, chronic left maxillary sinusitis with polypoid mass at the left middle meatus, recommend nasal endoscopy.  Electronically Signed   By: Jorje Guild M.D.   On: 11/26/2021 12:47 Note: Reviewed        Physical Exam  General appearance: Well nourished, well developed, and well hydrated. In no apparent acute  distress Mental status: Alert, oriented x 3 (person, place, & time)       Respiratory: No evidence of acute respiratory distress Eyes: PERLA Vitals: BP 137/89    Pulse 64    Temp (!) 97 F (36.1 C) (Temporal)    Resp 18    Ht 6' (1.829 m)    Wt 185 lb (83.9 kg)    SpO2 95%    BMI 25.09 kg/m  BMI: Estimated body mass index is 25.09 kg/m as calculated from the following:   Height as of this encounter: 6' (1.829 m).   Weight as of this encounter: 185 lb (83.9 kg). Ideal:  Ideal body weight: 77.6 kg (171 lb 1.2 oz) Adjusted ideal body weight: 80.1 kg (176 lb 10.3 oz)  Assessment   Status Diagnosis  Controlled Controlled Controlled 1. Chronic pain syndrome   2. Chronic low back pain (1ry area of Pain) (Bilateral) (R>L) w/o sciatica   3. Lumbar facet syndrome (Bilateral) (R>L)   4. Chronic hip pain (2ry area of Pain) (Bilateral) (R>L)   5. Chronic lower extremity pain (3ry area of Pain) (Bilateral) (R>L)   6. Chronic lumbar radicular pain (Left) (L4 Dermatome)   7. Chronic neck pain   8. Cervical radiculopathy (Right)   9. DDD (degenerative disc disease), cervical   10. Cervical spondylosis with radiculopathy   11. Cervical foraminal stenosis (Right: C5-C8) (Left: C6-C7)   12. Abnormal x-ray of cervical spine   13. Abnormal MRI, cervical spine (11/28/2020)   14. Diffuse myofascial pain syndrome   15. Pharmacologic therapy   16. Uncomplicated opioid dependence ([place])   17. Encounter for medication management   18. Encounter for chronic pain management      Updated Problems: Problem  Cervical radiculopathy (Right)   Affecting C8 and occasionally C6.   Cervical Spondylosis With Radiculopathy  Abnormal MRI, cervical spine (11/28/2020)   (11/28/2020) CERVICAL MRI FINDINGS: Alignment: Straightening of the cervical lordosis with trace retrolisthesis of C3 on C4 and C6 on C7. Minimal grade 1 anterolisthesis C4 on C5.  DISC LEVELS: C2-3: Minimal posterior disc osteophyte complex with bilateral facet arthropathy and uncovertebral spurring. Mild right foraminal stenosis. C3-4: Posterior disc osteophyte complex with bilateral facet and uncovertebral arthropathy. Severe bilateral foraminal stenosis with mild canal stenosis. C4-5: Posterior disc osteophyte complex with right greater than left facet arthropathy. Mild-to-moderate right and mild left foraminal stenosis. C5-6: Posterior disc osteophyte complex with bilateral facet and uncovertebral arthropathy.  Findings result in moderate canal stenosis with moderate to severe bilateral foraminal stenosis. C6-7: Left paracentral disc osteophyte complex and uncovertebral spurring result in moderate canal stenosis with moderate left and mild right foraminal stenosis. C7-T1: No significant disc protrusion, foraminal stenosis, or canal stenosis.  IMPRESSION: 1. Multilevel cervical spondylosis 2. Moderate canal stenosis with moderate to severe bilateral foramina stenosis at C5-6. 3. Moderate canal stenosis of C6-7. 4. Severe bilateral foraminal stenosis at C3-4.     Plan of Care  Problem-specific:  No problem-specific Assessment & Plan notes found for this encounter.  Caleb Edwards has a current medication list which includes the following long-term medication(s): [START ON 12/16/2021] tramadol, atorvastatin, chlorthalidone, lisinopril, pantoprazole, and trazodone.  Pharmacotherapy (Medications Ordered): Meds ordered this encounter  Medications   traMADol (ULTRAM) 50 MG tablet    Sig: Take 2 tablets (100 mg total) by mouth every 6 (six) hours as needed for severe pain. Each refill must last 30 days.    Dispense:  240 tablet    Refill:  5    DO NOT: delete (not duplicate); no partial-fill (will deny script to complete), no refill request (F/U required). DISPENSE: 1 day early if closed on fill date. WARN: No CNS-depressants within 8 hrs of med.   Orders:  Orders Placed This Encounter  Procedures   Cervical Epidural Injection    Sedation: Patient's choice. Purpose: Therapeutic Indication(s): Radiculitis and cervicalgia associater with cervical degenerative disc disease.    Standing Status:   Future    Standing Expiration Date:   03/11/2022    Scheduling Instructions:     Procedure: Cervical Epidural Steroid Injection/Block     Level(s): C7-T1     Laterality: Right-sided     Timeframe: As soon as schedule allows    Order Specific Question:   Where will this procedure be performed?     Answer:   [facility]    Comments:   by Dr. Dossie Arbour   Follow-up plan:   Return for [facility]) procedure: (R) CESI .     Interventional Therapies  Risk   Complexity Considerations:   CKD HTN   Planned   Pending:   Pending further evaluation   Under consideration:   Diagnostic right IA hip joint injection  Diagnostic bilateral lumbar facet block  Possible bilateral lumbar facet RFA    Completed:   Diagnostic left cervical ESI x1 (10/28/2020)  Therapeutic left L3-4 LESI x2 (06/13/2018)  Therapeutic left L3 TFESI x1 (03/13/2020)  Therapeutic left L4 TFESI x2 (03/13/2020)  Therapeutic right L3 TFESI x2 (03/13/2020)  Therapeutic right L4 TFESI x3 (03/13/2020)  Referral to neurosurgery Elayne Guerin, MD) for cervical instability (11/11/2020)    Therapeutic   Palliative (PRN) options:   Palliative left L3-4 LESI #3  Palliative left L3 TFESI #2  Palliative left L4 TFESI #3  Palliative right L3 TFESI #3  Palliative right L4 TFESI #4     Recent Visits No visits were found meeting these conditions. Showing recent visits within past 90 days and meeting all other requirements Today's Visits Date Type Provider Dept  12/09/21 Office Visit Milinda Pointer, MD [facility]  Showing today's visits and meeting all other requirements Future Appointments No visits were found meeting these conditions. Showing future appointments within next 90 days and meeting all other requirements  I discussed the assessment and treatment plan with the patient. The patient was provided an opportunity to ask questions and all were answered. The patient agreed with the plan and demonstrated an understanding of the instructions.  Patient advised to call back or seek an in-person evaluation if the symptoms or condition worsens.  Duration of encounter: 30 minutes.  Note by: Gaspar Cola, MD Date: 12/09/2021; Time: 3:20 PM

## 2021-12-09 ENCOUNTER — Ambulatory Visit: Payer: Medicare HMO | Attending: Pain Medicine | Admitting: Pain Medicine

## 2021-12-09 ENCOUNTER — Other Ambulatory Visit: Payer: Self-pay

## 2021-12-09 VITALS — BP 137/89 | HR 64 | Temp 97.0°F | Resp 18 | Ht 72.0 in | Wt 185.0 lb

## 2021-12-09 DIAGNOSIS — M25551 Pain in right hip: Secondary | ICD-10-CM | POA: Diagnosis not present

## 2021-12-09 DIAGNOSIS — R937 Abnormal findings on diagnostic imaging of other parts of musculoskeletal system: Secondary | ICD-10-CM | POA: Insufficient documentation

## 2021-12-09 DIAGNOSIS — F1120 Opioid dependence, uncomplicated: Secondary | ICD-10-CM

## 2021-12-09 DIAGNOSIS — M5030 Other cervical disc degeneration, unspecified cervical region: Secondary | ICD-10-CM

## 2021-12-09 DIAGNOSIS — M7918 Myalgia, other site: Secondary | ICD-10-CM

## 2021-12-09 DIAGNOSIS — M79604 Pain in right leg: Secondary | ICD-10-CM

## 2021-12-09 DIAGNOSIS — M4802 Spinal stenosis, cervical region: Secondary | ICD-10-CM

## 2021-12-09 DIAGNOSIS — M4722 Other spondylosis with radiculopathy, cervical region: Secondary | ICD-10-CM

## 2021-12-09 DIAGNOSIS — Z79899 Other long term (current) drug therapy: Secondary | ICD-10-CM

## 2021-12-09 DIAGNOSIS — M542 Cervicalgia: Secondary | ICD-10-CM

## 2021-12-09 DIAGNOSIS — M47816 Spondylosis without myelopathy or radiculopathy, lumbar region: Secondary | ICD-10-CM

## 2021-12-09 DIAGNOSIS — M5412 Radiculopathy, cervical region: Secondary | ICD-10-CM

## 2021-12-09 DIAGNOSIS — M5416 Radiculopathy, lumbar region: Secondary | ICD-10-CM

## 2021-12-09 DIAGNOSIS — G894 Chronic pain syndrome: Secondary | ICD-10-CM

## 2021-12-09 DIAGNOSIS — M79605 Pain in left leg: Secondary | ICD-10-CM

## 2021-12-09 DIAGNOSIS — M545 Low back pain, unspecified: Secondary | ICD-10-CM

## 2021-12-09 DIAGNOSIS — M25552 Pain in left hip: Secondary | ICD-10-CM

## 2021-12-09 DIAGNOSIS — G8929 Other chronic pain: Secondary | ICD-10-CM

## 2021-12-09 MED ORDER — TRAMADOL HCL 50 MG PO TABS: 100.0000 mg | ORAL_TABLET | Freq: Four times a day (QID) | ORAL | 5 refills | Status: DC | PRN

## 2021-12-09 NOTE — Patient Instructions (Addendum)
______________________________________________________________________ ° °Preparing for Procedure with Sedation ° °NOTICE: Due to recent regulatory changes, starting on May 04, 2021, procedures requiring intravenous (IV) sedation will no longer be performed at the [facility].  These types of procedures are required to be performed at [facility] ambulatory surgery facility.  We are very sorry for the inconvenience. ° °Procedure appointments are limited to planned procedures: °No Prescription Refills. °No disability issues will be discussed. °No medication changes will be discussed. ° °Instructions: °Oral Intake: Do not eat or drink anything for at least 8 hours prior to your procedure. (Exception: Blood Pressure Medication. See below.) °Transportation: A driver is required. You may not drive yourself after the procedure. °Blood Pressure Medicine: Do not forget to take your blood pressure medicine with a sip of water the morning of the procedure. If your Diastolic (lower reading) is above 100 mmHg, elective cases will be cancelled/rescheduled. °Blood thinners: These will need to be stopped for procedures. Notify our staff if you are taking any blood thinners. Depending on which one you take, there will be specific instructions on how and when to stop it. °Diabetics on insulin: Notify the staff so that you can be scheduled 1st case in the morning. If your diabetes requires high dose insulin, take only ½ of your normal insulin dose the morning of the procedure and notify the staff that you have done so. °Preventing infections: Shower with an antibacterial soap the morning of your procedure. °Build-up your immune system: Take 1000 mg of Vitamin C with every meal (3 times a day) the day prior to your procedure. °Antibiotics: Inform the staff if you have a condition or reason that requires you to take antibiotics before dental procedures. °Pregnancy: If you are pregnant, call and cancel the procedure. °Sickness: If  you have a cold, fever, or any active infections, call and cancel the procedure. °Arrival: You must be in the facility at least 30 minutes prior to your scheduled procedure. °Children: Do not bring children with you. °Dress appropriately: Bring dark clothing that you would not mind if they get stained. °Valuables: Do not bring any jewelry or valuables. ° °Reasons to call and reschedule or cancel your procedure: (Following these recommendations will minimize the risk of a serious complication.) °Surgeries: Avoid having procedures within 2 weeks of any surgery. (Avoid for 2 weeks before or after any surgery). °Flu Shots: Avoid having procedures within 2 weeks of a flu shots. (Avoid for 2 weeks before or after immunizations). °Barium: Avoid having a procedure within 7-10 days after having had a radiological study involving the use of radiological contrast. (Myelograms, Barium swallow or enema study). °Heart attacks: Avoid any elective procedures or surgeries for the initial 6 months after a "Myocardial Infarction" (Heart Attack). °Blood thinners: It is imperative that you stop these medications before procedures. Let us know if you if you take any blood thinner.  °Infection: Avoid procedures during or within two weeks of an infection (including chest colds or gastrointestinal problems). Symptoms associated with infections include: Localized redness, fever, chills, night sweats or profuse sweating, burning sensation when voiding, cough, congestion, stuffiness, runny nose, sore throat, diarrhea, nausea, vomiting, cold or Flu symptoms, recent or current infections. It is specially important if the infection is over the area that we intend to treat. °Heart and lung problems: Symptoms that may suggest an active cardiopulmonary problem include: cough, chest pain, breathing difficulties or shortness of breath, dizziness, ankle swelling, uncontrolled high or unusually low blood pressure, and/or palpitations. If you are    experiencing any of these symptoms, cancel your procedure and contact your primary care physician for an evaluation. ° °Remember:  °Regular Business hours are:  °Monday to Thursday 8:00 AM to 4:00 PM ° °Provider's Schedule: °Llesenia Fogal, MD:  °Procedure days: Tuesday and Thursday 7:30 AM to 4:00 PM ° °Bilal Lateef, MD:  °Procedure days: Monday and Wednesday 7:30 AM to 4:00 PM °______________________________________________________________________ ° ____________________________________________________________________________________________ ° °General Risks and Possible Complications ° °Patient Responsibilities: It is important that you read this as it is part of your informed consent. It is our duty to inform you of the risks and possible complications associated with treatments offered to you. It is your responsibility as a patient to read this and to ask questions about anything that is not clear or that you believe was not covered in this document. ° °Patient’s Rights: You have the right to refuse treatment. You also have the right to change your mind, even after initially having agreed to have the treatment done. However, under this last option, if you wait until the last second to change your mind, you may be charged for the materials used up to that point. ° °Introduction: Medicine is not an exact science. Everything in Medicine, including the lack of treatment(s), carries the potential for danger, harm, or loss (which is by definition: Risk). In Medicine, a complication is a secondary problem, condition, or disease that can aggravate an already existing one. All treatments carry the risk of possible complications. The fact that a side effects or complications occurs, does not imply that the treatment was conducted incorrectly. It must be clearly understood that these can happen even when everything is done following the highest safety standards. ° °No treatment: You can choose not to proceed with the  proposed treatment alternative. The “PRO(s)” would include: avoiding the risk of complications associated with the therapy. The “CON(s)” would include: not getting any of the treatment benefits. These benefits fall under one of three categories: diagnostic; therapeutic; and/or palliative. Diagnostic benefits include: getting information which can ultimately lead to improvement of the disease or symptom(s). Therapeutic benefits are those associated with the successful treatment of the disease. Finally, palliative benefits are those related to the decrease of the primary symptoms, without necessarily curing the condition (example: decreasing the pain from a flare-up of a chronic condition, such as incurable terminal cancer). ° °General Risks and Complications: These are associated to most interventional treatments. They can occur alone, or in combination. They fall under one of the following six (6) categories: no benefit or worsening of symptoms; bleeding; infection; nerve damage; allergic reactions; and/or death. °No benefits or worsening of symptoms: In Medicine there are no guarantees, only probabilities. No healthcare provider can ever guarantee that a medical treatment will work, they can only state the probability that it may. Furthermore, there is always the possibility that the condition may worsen, either directly, or indirectly, as a consequence of the treatment. °Bleeding: This is more common if the patient is taking a blood thinner, either prescription or over the counter (example: Goody Powders, Fish oil, Aspirin, Garlic, etc.), or if suffering a condition associated with impaired coagulation (example: Hemophilia, cirrhosis of the liver, low platelet counts, etc.). However, even if you do not have one on these, it can still happen. If you have any of these conditions, or take one of these drugs, make sure to notify your treating physician. °Infection: This is more common in patients with a compromised  immune system, either due to disease (example:   diabetes, cancer, human immunodeficiency virus [HIV], etc.), or due to medications or treatments (example: therapies used to treat cancer and rheumatological diseases). However, even if you do not have one on these, it can still happen. If you have any of these conditions, or take one of these drugs, make sure to notify your treating physician. °Nerve Damage: This is more common when the treatment is an invasive one, but it can also happen with the use of medications, such as those used in the treatment of cancer. The damage can occur to small secondary nerves, or to large primary ones, such as those in the spinal cord and brain. This damage may be temporary or permanent and it may lead to impairments that can range from temporary numbness to permanent paralysis and/or brain death. °Allergic Reactions: Any time a substance or material comes in contact with our body, there is the possibility of an allergic reaction. These can range from a mild skin rash (contact dermatitis) to a severe systemic reaction (anaphylactic reaction), which can result in death. °Death: In general, any medical intervention can result in death, most of the time due to an unforeseen complication. °____________________________________________________________________________________________ ____________________________________________________________________________________________ ° °Medication Rules ° °Purpose: To inform patients, and their family members, of our rules and regulations. ° °Applies to: All patients receiving prescriptions (written or electronic). ° °Pharmacy of record: Pharmacy where electronic prescriptions will be sent. If written prescriptions are taken to a different pharmacy, please inform the nursing staff. The pharmacy listed in the electronic medical record should be the one where you would like electronic prescriptions to be sent. ° °Electronic prescriptions: In compliance  with the [place] [employer]reng[employer] Opioid Misuse Prevention (STOP) Act of 2017 (Session Law 2017-74/H243), effective October 04, 2018, all controlled substances must be electronically prescribed. Calling prescriptions to the pharmacy will cease to exist. ° °Prescription refills: Only during scheduled appointments. Applies to all prescriptions. ° °NOTE: The following applies primarily to controlled substances (Opioid* Pain Medications).  ° °Type of encounter (visit): For patients receiving controlled substances, face-to-face visits are required. (Not an option or up to the patient.) ° °Patient's responsibilities: °Pain Pills: Bring all pain pills to every appointment (except for procedure appointments). °Pill Bottles: Bring pills in original pharmacy bottle. Always bring the newest bottle. Bring bottle, even if empty. °Medication refills: You are responsible for knowing and keeping track of what medications you take and those you need refilled. °The day before your appointment: write a list of all prescriptions that need to be refilled. °The day of the appointment: give the list to the admitting nurse. Prescriptions will be written only during appointments. No prescriptions will be written on procedure days. °If you forget a medication: it will not be "Called in", "Faxed", or "electronically sent". You will need to get another appointment to get these prescribed. °No early refills. Do not call asking to have your prescription filled early. °Prescription Accuracy: You are responsible for carefully inspecting your prescriptions before leaving our office. Have the discharge nurse carefully go over each prescription with you, before taking them home. Make sure that your name is accurately spelled, that your address is correct. Check the name and dose of your medication to make sure it is accurate. Check the number of pills, and the written instructions to make sure they are clear and accurate. Make sure that you are given  enough medication to last until your next medication refill appointment. °Taking Medication: Take medication as prescribed. When it comes to controlled substances, taking less   pills or less frequently than prescribed is permitted and encouraged. °Never take more pills than instructed. °Never take medication more frequently than prescribed.  °Inform other Doctors: Always inform, all of your healthcare providers, of all the medications you take. °Pain Medication from other Providers: You are not allowed to accept any additional pain medication from any other Doctor or Healthcare provider. There are two exceptions to this rule. (see below) In the event that you require additional pain medication, you are responsible for notifying us, as stated below. °Cough Medicine: Often these contain an opioid, such as codeine or hydrocodone. Never accept or take cough medicine containing these opioids if you are already taking an opioid* medication. The combination may cause respiratory failure and death. °Medication Agreement: You are responsible for carefully reading and following our Medication Agreement. This must be signed before receiving any prescriptions from our practice. Safely store a copy of your signed Agreement. Violations to the Agreement will result in no further prescriptions. (Additional copies of our Medication Agreement are available upon request.) °Laws, Rules, & Regulations: All patients are expected to follow all Federal and State Laws, Statutes, Rules, & Regulations. Ignorance of the Laws does not constitute a valid excuse.  °Illegal drugs and Controlled Substances: The use of illegal substances (including, but not limited to marijuana and its derivatives) and/or the illegal use of any controlled substances is strictly prohibited. Violation of this rule may result in the immediate and permanent discontinuation of any and all prescriptions being written by our practice. The use of any illegal substances is  prohibited. °Adopted CDC guidelines & recommendations: Target dosing levels will be at or below 60 MME/day. Use of benzodiazepines** is not recommended. ° °Exceptions: There are only two exceptions to the rule of not receiving pain medications from other Healthcare Providers. °Exception #1 (Emergencies): In the event of an emergency (i.e.: accident requiring emergency care), you are allowed to receive additional pain medication. However, you are responsible for: As soon as you are able, call our office [phone], at any time of the day or night, and leave a message stating your name, the date and nature of the emergency, and the name and dose of the medication prescribed. In the event that your call is answered by a member of our staff, make sure to document and save the date, time, and the name of the person that took your information.  °Exception #2 (Planned Surgery): In the event that you are scheduled by another doctor or dentist to have any type of surgery or procedure, you are allowed (for a period no longer than 30 days), to receive additional pain medication, for the acute post-op pain. However, in this case, you are responsible for picking up a copy of our "Post-op Pain Management for Surgeons" handout, and giving it to your surgeon or dentist. This document is available at our office, and does not require an appointment to obtain it. Simply go to our office during business hours (Monday-Thursday from 8:00 AM to 4:00 PM) (Friday 8:00 AM to 12:00 Noon) or if you have a scheduled appointment with us, prior to your surgery, and ask for it by name. In addition, you are responsible for: calling our office [phone], at any time of the day or night, and leaving a message stating your name, name of your surgeon, type of surgery, and date of procedure or surgery. Failure to comply with your responsibilities may result in termination of therapy involving the controlled substances. °Medication Agreement  Violation.   Following the above rules, including your responsibilities will help you in avoiding a Medication Agreement Violation (“Breaking your Pain Medication Contract”). ° °*Opioid medications include: morphine, codeine, oxycodone, oxymorphone, hydrocodone, hydromorphone, meperidine, tramadol, tapentadol, buprenorphine, fentanyl, methadone. °**Benzodiazepine medications include: diazepam (Valium), alprazolam (Xanax), clonazepam (Klonopine), lorazepam (Ativan), clorazepate (Tranxene), chlordiazepoxide (Librium), estazolam (Prosom), oxazepam (Serax), temazepam (Restoril), triazolam (Halcion) °(Last updated: 07/01/2021) °____________________________________________________________________________________________ ° ____________________________________________________________________________________________ ° °Medication Recommendations and Reminders ° °Applies to: All patients receiving prescriptions (written and/or electronic). ° °Medication Rules & Regulations: These rules and regulations exist for your safety and that of others. They are not flexible and neither are we. Dismissing or ignoring them will be considered "non-compliance" with medication therapy, resulting in complete and irreversible termination of such therapy. (See document titled "Medication Rules" for more details.) In all conscience, because of safety reasons, we cannot continue providing a therapy where the patient does not follow instructions. ° °Pharmacy of record:  °Definition: This is the pharmacy where your electronic prescriptions will be sent.  °We do not endorse any particular pharmacy, however, we have experienced problems with [facility] not securing enough medication supply for the community. °We do not restrict you in your choice of pharmacy. However, once we write for your prescriptions, we will NOT be re-sending more prescriptions to fix restricted supply problems created by your pharmacy, or your insurance.  °The pharmacy listed in  the electronic medical record should be the one where you want electronic prescriptions to be sent. °If you choose to change pharmacy, simply notify our nursing staff. ° °Recommendations: °Keep all of your pain medications in a safe place, under lock and key, even if you live alone. We will NOT replace lost, stolen, or damaged medication. °After you fill your prescription, take 1 week's worth of pills and put them away in a safe place. You should keep a separate, properly labeled bottle for this purpose. The remainder should be kept in the original bottle. Use this as your primary supply, until it runs out. Once it's gone, then you know that you have 1 week's worth of medicine, and it is time to come in for a prescription refill. If you do this correctly, it is unlikely that you will ever run out of medicine. °To make sure that the above recommendation works, it is very important that you make sure your medication refill appointments are scheduled at least 1 week before you run out of medicine. To do this in an effective manner, make sure that you do not leave the office without scheduling your next medication management appointment. Always ask the nursing staff to show you in your prescription , when your medication will be running out. Then arrange for the receptionist to get you a return appointment, at least 7 days before you run out of medicine. Do not wait until you have 1 or 2 pills left, to come in. This is very poor planning and does not take into consideration that we may need to cancel appointments due to bad weather, sickness, or emergencies affecting our staff. °DO NOT ACCEPT A "Partial Fill": If for any reason your pharmacy does not have enough pills/tablets to completely fill or refill your prescription, do not allow for a "partial fill". The law allows the pharmacy to complete that prescription within 72 hours, without requiring a new prescription. If they do not fill the rest of your prescription  within those 72 hours, you will need a separate prescription to fill the remaining amount, which we will NOT provide. If   the reason for the partial fill is your insurance, you will need to talk to the pharmacist about payment alternatives for the remaining tablets, but again, DO NOT ACCEPT A PARTIAL FILL, unless you can trust your pharmacist to obtain the remainder of the pills within 72 hours. ° °Prescription refills and/or changes in medication(s):  °Prescription refills, and/or changes in dose or medication, will be conducted only during scheduled medication management appointments. (Applies to both, written and electronic prescriptions.) °No refills on procedure days. No medication will be changed or started on procedure days. No changes, adjustments, and/or refills will be conducted on a procedure day. Doing so will interfere with the diagnostic portion of the procedure. °No phone refills. No medications will be "called into the pharmacy". °No Fax refills. °No weekend refills. °No Holliday refills. °No after hours refills. ° °Remember:  °Business hours are:  °Monday to Thursday 8:00 AM to 4:00 PM °Provider's Schedule: °Kery Haltiwanger, MD - Appointments are:  °Medication management: Monday and Wednesday 8:00 AM to 4:00 PM °Procedure day: Tuesday and Thursday 7:30 AM to 4:00 PM °Bilal Lateef, MD - Appointments are:  °Medication management: Tuesday and Thursday 8:00 AM to 4:00 PM °Procedure day: Monday and Wednesday 7:30 AM to 4:00 PM °(Last update: 04/23/2020) °____________________________________________________________________________________________ ° ____________________________________________________________________________________________ ° °CBD (cannabidiol) & Delta-8 (Delta-8 tetrahydrocannabinol) WARNING ° °Intro: Cannabidiol (CBD) and tetrahydrocannabinol (THC), are two natural compounds found in plants of the Cannabis genus. They can both be extracted from hemp or cannabis. Hemp and cannabis come  from the Cannabis sativa plant. Both compounds interact with your body’s endocannabinoid system, but they have very different effects. CBD does not produce the high sensation associated with cannabis. Delta-8 tetrahydrocannabinol, also known as delta-8 THC, is a psychoactive substance found in the Cannabis sativa plant, of which marijuana and hemp are two varieties. THC is responsible for the high associated with the illicit use of marijuana. ° °Applicable to: All individuals currently taking or considering taking CBD (cannabidiol) and, more important, all patients taking opioid analgesic controlled substances (pain medication). (Example: oxycodone; oxymorphone; hydrocodone; hydromorphone; morphine; methadone; tramadol; tapentadol; fentanyl; buprenorphine; butorphanol; dextromethorphan; meperidine; codeine; etc.) ° °Legal status: CBD remains a Schedule I drug prohibited for any use. CBD is illegal with one exception. In the [place], CBD has a limited Food and Drug Administration (FDA) approval for the treatment of two specific types of epilepsy disorders. Only one CBD product has been approved by the FDA for this purpose: "Epidiolex". FDA is aware that some companies are marketing products containing cannabis and cannabis-derived compounds in ways that violate the Federal Food, Drug and Cosmetic Act (FD&C Act) and that may put the health and safety of consumers at risk. The FDA, a Federal agency, has not enforced the CBD status since 2018. UPDATE: (11/20/2021) The Drug Enforcement Agency (DEA) issued a letter stating that "delta" cannabinoids, including Delta-8-THCO and Delta-9-THCO, synthetically derived from hemp do not qualify as hemp and will be viewed as Schedule I drugs. (Schedule I drugs, substances, or chemicals are defined as drugs with no currently accepted medical use and a high potential for abuse. Some examples of Schedule I drugs are: heroin, lysergic acid diethylamide (LSD), marijuana  (cannabis), 3,4-methylenedioxymethamphetamine (ecstasy), methaqualone, and peyote.) (https://www.dea.gov) ° °Legality: Some manufacturers ship CBD products nationally, which is illegal. Often such products are sold online and are therefore available throughout the country. CBD is openly sold in head shops and health food stores in some states where such sales have not been explicitly legalized. Selling   unapproved products with unsubstantiated therapeutic claims is not only a violation of the law, but also can put patients at risk, as these products have not been proven to be safe or effective. Federal illegality makes it difficult to conduct research on CBD. ° °Reference: "FDA Regulation of Cannabis and Cannabis-Derived Products, Including Cannabidiol (CBD)" - https://www.fda.gov/news-events/public-health-focus/fda-regulation-cannabis-and-cannabis-derived-products-including-cannabidiol-cbd ° °Warning: CBD is not FDA approved and has not undergo the same manufacturing controls as prescription drugs.  This means that the purity and safety of available CBD may be questionable. Most of the time, despite manufacturer's claims, it is contaminated with THC (delta-9-tetrahydrocannabinol - the chemical in marijuana responsible for the "HIGH").  When this is the case, the THC contaminant will trigger a positive urine drug screen (UDS) test for Marijuana (carboxy-THC). Because a positive UDS for any illicit substance is a violation of our medication agreement, your opioid analgesics (pain medicine) may be permanently discontinued. °The FDA recently put out a warning about 5 things that everyone should be aware of regarding Delta-8 THC: °Delta-8 THC products have not been evaluated or approved by the FDA for safe use and may be marketed in ways that put the public health at risk. °The FDA has received adverse event reports involving delta-8 THC-containing products. °Delta-8 THC has psychoactive and intoxicating  effects. °Delta-8 THC manufacturing often involve use of potentially harmful chemicals to create the concentrations of delta-8 THC claimed in the marketplace. The final delta-8 THC product may have potentially harmful by-products (contaminants) due to the chemicals used in the process. Manufacturing of delta-8 THC products may occur in uncontrolled or unsanitary settings, which may lead to the presence of unsafe contaminants or other potentially harmful substances. °Delta-8 THC products should be kept out of the reach of children and pets. ° °MORE ABOUT CBD ° °General Information: CBD was discovered in 1940 and it is a derivative of the cannabis sativa genus plants (Marijuana and Hemp). It is one of the 113 identified substances found in Marijuana. It accounts for up to 40% of the plant's extract. As of 2018, preliminary clinical studies on CBD included research for the treatment of anxiety, movement disorders, and pain. CBD is available and consumed in multiple forms, including inhalation of smoke or vapor, as an aerosol spray, and by mouth. It may be supplied as an oil containing CBD, capsules, dried cannabis, or as a liquid solution. CBD is thought not to be as psychoactive as THC (delta-9-tetrahydrocannabinol - the chemical in marijuana responsible for the "HIGH"). Studies suggest that CBD may interact with different biological target receptors in the body, including cannabinoid and other neurotransmitter receptors. As of 2018 the mechanism of action for its biological effects has not been determined. ° °Side-effects   Adverse reactions: Dry mouth, diarrhea, decreased appetite, fatigue, drowsiness, malaise, weakness, sleep disturbances, and others. ° °Drug interactions: CBC may interact with other medications such as blood-thinners. Because CBD causes drowsiness on its own, it also increases the drowsiness caused by other medications, including antihistamines (such as Benadryl), benzodiazepines (Xanax, Ativan,  Valium), antipsychotics, antidepressants and opioids, as well as alcohol and supplements such as kava, melatonin and St. John's Wort. Be cautious with the following combinations:  ° °Brivaracetam (Briviact) °Brivaracetam is changed and broken down by the body. CBD might decrease how quickly the body breaks down brivaracetam. This might increase levels of brivaracetam in the body. ° °Caffeine °Caffeine is changed and broken down by the body. CBD might decrease how quickly the body breaks down caffeine. This might increase levels of   caffeine in the body. ° °Carbamazepine (Tegretol) °Carbamazepine is changed and broken down by the body. CBD might decrease how quickly the body breaks down carbamazepine. This might increase levels of carbamazepine in the body and increase its side effects. ° °Citalopram (Celexa) °Citalopram is changed and broken down by the body. CBD might decrease how quickly the body breaks down citalopram. This might increase levels of citalopram in the body and increase its side effects. ° °Clobazam (Onfi) °Clobazam is changed and broken down by the liver. CBD might decrease how quickly the liver breaks down clobazam. This might increase the effects and side effects of clobazam. ° °Eslicarbazepine (Aptiom) °Eslicarbazepine is changed and broken down by the body. CBD might decrease how quickly the body breaks down eslicarbazepine. This might increase levels of eslicarbazepine in the body by a small amount. ° °Everolimus (Zostress) °Everolimus is changed and broken down by the body. CBD might decrease how quickly the body breaks down everolimus. This might increase levels of everolimus in the body. ° °Lithium °Taking higher doses of CBD might increase levels of lithium. This can increase the risk of lithium toxicity. ° °Medications changed by the liver (Cytochrome P450 1A1 (CYP1A1) substrates) °Some medications are changed and broken down by the liver. CBD might change how quickly the liver breaks down  these medications. This could change the effects and side effects of these medications. ° °Medications changed by the liver (Cytochrome P450 1A2 (CYP1A2) substrates) °Some medications are changed and broken down by the liver. CBD might change how quickly the liver breaks down these medications. This could change the effects and side effects of these medications. ° °Medications changed by the liver (Cytochrome P450 1B1 (CYP1B1) substrates) °Some medications are changed and broken down by the liver. CBD might change how quickly the liver breaks down these medications. This could change the effects and side effects of these medications. ° °Medications changed by the liver (Cytochrome P450 2A6 (CYP2A6) substrates) °Some medications are changed and broken down by the liver. CBD might change how quickly the liver breaks down these medications. This could change the effects and side effects of these medications. ° °Medications changed by the liver (Cytochrome P450 2B6 (CYP2B6) substrates) °Some medications are changed and broken down by the liver. CBD might change how quickly the liver breaks down these medications. This could change the effects and side effects of these medications. ° °Medications changed by the liver (Cytochrome P450 2C19 (CYP2C19) substrates) °Some medications are changed and broken down by the liver. CBD might change how quickly the liver breaks down these medications. This could change the effects and side effects of these medications. ° °Medications changed by the liver (Cytochrome P450 2C8 (CYP2C8) substrates) °Some medications are changed and broken down by the liver. CBD might change how quickly the liver breaks down these medications. This could change the effects and side effects of these medications. ° °Medications changed by the liver (Cytochrome P450 2C9 (CYP2C9) substrates) °Some medications are changed and broken down by the liver. CBD might change how quickly the liver breaks down these  medications. This could change the effects and side effects of these medications. ° °Medications changed by the liver (Cytochrome P450 2D6 (CYP2D6) substrates) °Some medications are changed and broken down by the liver. CBD might change how quickly the liver breaks down these medications. This could change the effects and side effects of these medications. ° °Medications changed by the liver (Cytochrome P450 2E1 (CYP2E1) substrates) °Some medications are changed and broken   down by the liver. CBD might change how quickly the liver breaks down these medications. This could change the effects and side effects of these medications. ° °Medications changed by the liver (Cytochrome P450 3A4 (CYP3A4) substrates) °Some medications are changed and broken down by the liver. CBD might change how quickly the liver breaks down these medications. This could change the effects and side effects of these medications. ° °Medications changed by the liver (Glucuronidated drugs) °Some medications are changed and broken down by the liver. CBD might change how quickly the liver breaks down these medications. This could change the effects and side effects of these medications. ° °Medications that decrease the breakdown of other medications by the liver (Cytochrome P450 2C19 (CYP2C19) inhibitors) °CBD is changed and broken down by the liver. Some drugs decrease how quickly the liver changes and breaks down CBD. This could change the effects and side effects of CBD. ° °Medications that decrease the breakdown of other medications in the liver (Cytochrome P450 3A4 (CYP3A4) inhibitors) °CBD is changed and broken down by the liver. Some drugs decrease how quickly the liver changes and breaks down CBD. This could change the effects and side effects of CBD. ° °Medications that increase breakdown of other medications by the liver (Cytochrome P450 3A4 (CYP3A4) inducers) °CBD is changed and broken down by the liver. Some drugs increase how quickly the  liver changes and breaks down CBD. This could change the effects and side effects of CBD. ° °Medications that increase the breakdown of other medications by the liver (Cytochrome P450 2C19 (CYP2C19) inducers) °CBD is changed and broken down by the liver. Some drugs increase how quickly the liver changes and breaks down CBD. This could change the effects and side effects of CBD. ° °Methadone (Dolophine) °Methadone is broken down by the liver. CBD might decrease how quickly the liver breaks down methadone. Taking cannabidiol along with methadone might increase the effects and side effects of methadone. ° °Rufinamide (Banzel) °Rufinamide is changed and broken down by the body. CBD might decrease how quickly the body breaks down rufinamide. This might increase levels of rufinamide in the body by a small amount. ° °Sedative medications (CNS depressants) °CBD might cause sleepiness and slowed breathing. Some medications, called sedatives, can also cause sleepiness and slowed breathing. Taking CBD with sedative medications might cause breathing problems and/or too much sleepiness. ° °Sirolimus (Rapamune) °Sirolimus is changed and broken down by the body. CBD might decrease how quickly the body breaks down sirolimus. This might increase levels of sirolimus in the body. ° °Stiripentol (Diacomit) °Stiripentol is changed and broken down by the body. CBD might decrease how quickly the body breaks down stiripentol. This might increase levels of stiripentol in the body and increase its side effects. ° °Tacrolimus (Prograf) °Tacrolimus is changed and broken down by the body. CBD might decrease how quickly the body breaks down tacrolimus. This might increase levels of tacrolimus in the body. ° °Tamoxifen (Soltamox) °Tamoxifen is changed and broken down by the body. CBD might affect how quickly the body breaks down tamoxifen. This might affect levels of tamoxifen in the body. ° °Topiramate (Topamax) °Topiramate is changed and broken  down by the body. CBD might decrease how quickly the body breaks down topiramate. This might increase levels of topiramate in the body by a small amount. ° °Valproate °Valproic acid can cause liver injury. Taking cannabidiol with valproic acid might increase the chance of liver injury. CBD and/or valproic acid might need to be stopped,   or the dose might need to be reduced. ° °Warfarin (Coumadin) °CBD might increase levels of warfarin, which can increase the risk for bleeding. CBD and/or warfarin might need to be stopped, or the dose might need to be reduced. ° °Zonisamide °Zonisamide is changed and broken down by the body. CBD might decrease how quickly the body breaks down zonisamide. This might increase levels of zonisamide in the body by a small amount. °(Last update: 12/02/2021) °____________________________________________________________________________________________ ° ____________________________________________________________________________________________ ° °Drug Holidays (Slow) ° °What is a "Drug Holiday"? °Drug Holiday: is the name given to the period of time during which a patient stops taking a medication(s) for the purpose of eliminating tolerance to the drug. ° °Benefits °Improved effectiveness of opioids. °Decreased opioid dose needed to achieve benefits. °Improved pain with lesser dose. ° °What is tolerance? °Tolerance: is the progressive decreased in effectiveness of a drug due to its repetitive use. With repetitive use, the body gets use to the medication and as a consequence, it loses its effectiveness. This is a common problem seen with opioid pain medications. As a result, a larger dose of the drug is needed to achieve the same effect that used to be obtained with a smaller dose. ° °How long should a "Drug Holiday" last? °You should stay off of the pain medicine for at least 14 consecutive days. (2 weeks) ° °Should I stop the medicine "cold turkey"? °No. You should always coordinate with  your Pain Specialist so that he/she can provide you with the correct medication dose to make the transition as smoothly as possible. ° °How do I stop the medicine? °Slowly. You will be instructed to decrease the daily amount of pills that you take by one (1) pill every seven (7) days. This is called a "slow downward taper" of your dose. For example: if you normally take four (4) pills per day, you will be asked to drop this dose to three (3) pills per day for seven (7) days, then to two (2) pills per day for seven (7) days, then to one (1) per day for seven (7) days, and at the end of those last seven (7) days, this is when the "Drug Holiday" would start.  ° °Will I have withdrawals? °By doing a "slow downward taper" like this one, it is unlikely that you will experience any significant withdrawal symptoms. Typically, what triggers withdrawals is the sudden stop of a high dose opioid therapy. Withdrawals can usually be avoided by slowly decreasing the dose over a prolonged period of time. If you do not follow these instructions and decide to stop your medication abruptly, withdrawals may be possible. ° °What are withdrawals? °Withdrawals: refers to the wide range of symptoms that occur after stopping or dramatically reducing opiate drugs after heavy and prolonged use. Withdrawal symptoms do not occur to patients that use low dose opioids, or those who take the medication sporadically. Contrary to benzodiazepine (example: Valium, Xanax, etc.) or alcohol withdrawals (“Delirium Tremens”), opioid withdrawals are not lethal. Withdrawals are the physical manifestation of the body getting rid of the excess receptors. ° °Expected Symptoms °Early symptoms of withdrawal may include: °Agitation °Anxiety °Muscle aches °Increased tearing °Insomnia °Runny nose °Sweating °Yawning ° °Late symptoms of withdrawal may include: °Abdominal cramping °Diarrhea °Dilated pupils °Goose bumps °Nausea °Vomiting ° °Will I experience  withdrawals? °Due to the slow nature of the taper, it is very unlikely that you will experience any. ° °What is a slow taper? °Taper: refers to the gradual decrease in dose.  °(  Last update: 04/23/2020) °____________________________________________________________________________________________ ° °  °

## 2021-12-09 NOTE — Progress Notes (Signed)
Nursing Pain Medication Assessment:  Safety precautions to be maintained throughout the outpatient stay will include: orient to surroundings, keep bed in low position, maintain call bell within reach at all times, provide assistance with transfer out of bed and ambulation.  Medication Inspection Compliance: Caleb Edwards did not comply with our request to bring his pills to be counted. He was reminded that bringing the medication bottles, even when empty, is a requirement.  Medication: None brought in. Pill/Patch Count: None available to be counted. Bottle Appearance: No container available. Did not bring bottle(s) to appointment. Filled Date: N/A Last Medication intake:  Today 

## 2021-12-14 NOTE — Progress Notes (Unsigned)
PROVIDER NOTE: Interpretation of information contained herein should be left to medically-trained personnel. Specific patient instructions are provided elsewhere under "Patient Instructions" section of medical record. This document was created in part using STT-dictation technology, any transcriptional errors that may result from this process are unintentional.  Patient: Caleb Edwards Type: Established DOB: Mar 02, 1951 MRN: [id number] PCP: Cherylann Parr, MD  Service: Procedure DOS: 12/15/2021 Setting: Ambulatory Location: Ambulatory outpatient facility Delivery: Face-to-face Provider: Gaspar Cola, MD Specialty: Interventional Pain Management Specialty designation: 09 Location: Outpatient facility Ref. Prov.: Milinda Pointer, MD   Procedure [facility] Interventional Pain Management )   Type: Cervical Epidural Steroid injection (ESI) (Interlaminar)  ***   Laterality: Right  Level: C7-T1 Imaging: Fluoroscopy-assisted DOS: 12/15/2021  Performed by: Milinda Pointer, MD Anesthesia: Local anesthesia (1-2% Lidocaine) Anxiolysis: None                 Sedation: None.   Purpose: Diagnostic/Therapeutic Indications: Cervicalgia, cervical radicular pain, degenerative disc disease, severe enough to impact quality of life or function. No diagnosis found. NAS-11 score:   Pre-procedure:  /10   Post-procedure:  /10     Pre-Procedure Preparation  Monitoring: As per clinic protocol. Respiration, ETCO2, SpO2, BP, heart rate and rhythm monitor placed and checked for adequate function  Risk Assessment: Vitals:  [id number] body mass index is 25.09 kg/m as calculated from the following:   Height as of 12/09/21: 6' (1.829 m).   Weight as of 12/09/21: 185 lb (83.9 kg)., Rate:  , BP: , Resp: , Temp: , SpO2:   Allergies: He has No Known Allergies.  Precautions: None required  Blood-thinner(s): None at this time  Coagulopathies: Reviewed. None identified.   Active Infection(s): Reviewed. None  identified. Caleb Edwards is afebrile   Location setting: Procedure suite Position: Prone, on modified reverse trendelenburg to facilitate breathing, with head in head-cradle. Pillows positioned under chest (below chin-level) with cervical spine flexed. Safety Precautions: Patient was assessed for positional comfort and pressure points before starting the procedure. Prepping solution: DuraPrep (Iodine Povacrylex [0.7% available iodine] and Isopropyl Alcohol, 74% w/w) Prep Area: Entire  cervicothoracic region Approach: percutaneous, paramedial Intended target: Posterior cervical epidural space Materials: Tray: Epidural Needle(s): Epidural (Tuohy) Qty: 1 Length: (33mm) 3.5-inch Gauge: 17G   No orders of the defined types were placed in this encounter.   No orders of the defined types were placed in this encounter.    Time-out:   I initiated and conducted the "Time-out" before starting the procedure, as per protocol. The patient was asked to participate by confirming the accuracy of the "Time Out" information. Verification of the correct person, site, and procedure were performed and confirmed by me, the nursing staff, and the patient. "Time-out" conducted as per Joint Commission's Universal Protocol (UP.01.01.01). Procedure checklist: Completed   H&P (Pre-op  Assessment)  Caleb Edwards is a 71 y.o. (year old), male patient, seen today for interventional treatment. He  has a past surgical history that includes Esophagogastroduodenoscopy (egd) with propofol (N/A, 01/03/2016); Colonoscopy with propofol (N/A, 08/02/2017); Esophagogastroduodenoscopy (egd) with propofol (08/02/2017); Esophagogastroduodenoscopy (egd) with propofol (N/A, 02/05/2018); Colonoscopy with propofol (N/A, 02/05/2018); Esophagogastroduodenoscopy (egd) with propofol (N/A, 03/30/2018); Esophagogastroduodenoscopy (egd) with propofol (N/A, 08/09/2019); Colonoscopy with propofol (N/A, 08/09/2019); Givens capsule study (N/A, 04/24/2020); and  Esophagogastroduodenoscopy (egd) with propofol (N/A, 04/24/2020). Caleb Edwards has a current medication list which includes the following prescription(s): amlodipine, atorvastatin, chlorthalidone, ferrous sulfate, lisinopril, pantoprazole, polyethylene glycol, sildenafil, [START ON 12/16/2021] tramadol, and trazodone. His primarily concern today is the No  chief complaint on file.  He has No Known Allergies.   Last encounter: My last encounter with him was on 12/09/2021. 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); Chronic neck pain; Cervical spondylosis; Chronic pain syndrome; Abdominal pain; Grade 1 Anterolisthesis (7-54mm) of L5 over S1; DDD (degenerative disc disease), lumbosacral; Lumbar facet arthropathy; Chronic hip pain (2ry area of Pain) (Bilateral) (R>L); Cervical radiculopathy (Left); Cervicalgia; DDD (degenerative disc disease), cervical; Abnormal x-ray of cervical spine; 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 Pain Assessment: Severity of   is reported as a  /10. Location:    / . Onset:  . Quality:  . Timing:  . Modifying factor(s):  Marland Kitchen Vitals:  vitals were not taken for this visit.   Reason for encounter: Interventional pain management therapy due pain of  at least four (4) weeks in duration, with to failure to respond to and/or inability to tolerate more conservative care.   Site Confirmation: Caleb Edwards was asked to confirm the procedure and laterality before marking the site.  Consent: Before the procedure and under the influence of no sedative(s), amnesic(s), or anxiolytics, the patient was informed of the treatment options, risks and possible complications. To fulfill our ethical and legal obligations, as recommended by the American Medical Association's Code of Ethics, I have informed the patient of my clinical impression; the nature and purpose of the treatment or procedure; the risks, benefits, and possible complications of the intervention; the alternatives, including doing nothing; the risk(s) and benefit(s) of the alternative treatment(s) or procedure(s); and the risk(s) and benefit(s) of doing nothing. The patient was provided information about the general risks and possible complications associated with the procedure. These may include, but are not limited to: failure to achieve desired goals, infection, bleeding, organ or nerve damage, allergic reactions, paralysis, and death. In addition, the patient was informed of those risks and complications associated to Spine-related procedures, such as failure to decrease pain; infection (i.e.: Meningitis, epidural or intraspinal abscess); bleeding (i.e.: epidural hematoma, subarachnoid hemorrhage, or any other type of intraspinal or peri-dural bleeding); organ or nerve damage (i.e.: Any type of peripheral nerve, nerve root, or spinal cord injury) with subsequent damage to sensory, motor, and/or autonomic systems, resulting in permanent pain, numbness, and/or weakness of one or several areas of the body; allergic reactions; (i.e.: anaphylactic reaction); and/or death. Furthermore, the patient was informed of those risks and complications associated with the medications. These include, but are not limited  to: allergic reactions (i.e.: anaphylactic or anaphylactoid reaction(s)); adrenal axis suppression; blood sugar elevation that in diabetics may result in ketoacidosis or comma; water retention that in patients with history of congestive heart failure may result in shortness of breath, pulmonary edema, and decompensation with resultant heart failure; weight gain; swelling or edema; medication-induced neural toxicity; particulate matter embolism and blood vessel occlusion with resultant organ, and/or nervous system infarction; and/or aseptic necrosis of one or more joints. Finally, the patient was informed that Medicine  is not an Chief Strategy Officer; therefore, there is also the possibility of unforeseen or unpredictable risks and/or possible complications that may result in a catastrophic outcome. The patient indicated having understood very clearly. We have given the patient no guarantees and we have made no promises. Enough time was given to the patient to ask questions, all of which were answered to the patient's satisfaction. Mr. Suchecki has indicated that he wanted to continue with the procedure. Attestation: I, the ordering provider, attest that I have discussed with the patient the benefits, risks, side-effects, alternatives, likelihood of achieving goals, and potential problems during recovery for the procedure that I have provided informed consent.  Date   Time: {CHL [facility]-PAIN TIME CHOICES:21018001}   Prophylactic antibiotics  Anti-infectives (From admission, onward)    None      Indication(s): None identified   Description of procedure   Start Time:   hrs  Local Anesthesia: Once the patient was positioned, prepped, and time-out was completed. The target area was identified located. The skin was marked with an approved surgical skin marker. Once marked, the skin (epidermis, dermis, and hypodermis), and deeper tissues (fat, connective tissue and muscle) were infiltrated with a small amount of a  short-acting local anesthetic, loaded on a 10cc syringe with a 25G, 1.5-in  Needle. An appropriate amount of time was allowed for local anesthetics to take effect before proceeding to the next step. Local Anesthetic: Lidocaine 1-2% The unused portion of the local anesthetic was discarded in the proper designated containers. Safety Precautions: Aspiration looking for blood return was conducted prior to all injections. At no point did I inject any substances, as a needle was being advanced. Before injecting, the patient was told to immediately notify me if he was experiencing any new onset of "ringing in the ears, or metallic taste in the mouth". No attempts were made at seeking any paresthesias. Safe injection practices and needle disposal techniques used. Medications properly checked for expiration dates. SDV (single dose vial) medications used. After the completion of the procedure, all disposable equipment used was discarded in the proper designated medical waste containers.  Technical description: Protocol guidelines were followed. Using fluoroscopic guidance, the epidural needle was introduced through the skin, ipsilateral to the reported pain, and advanced to the target area. Posterior laminar os was contacted and the needle walked caudad, until the lamina was cleared. The ligamentum flavum was engaged and the epidural space identified using loss-of-resistance technique with 2-3 ml of PF-NaCl (0.9% NSS), in a 5cc dedicated LOR syringe. See "Imaging guidance" below for use of contrast details.  Injection: Once satisfactory needle placement was confirmed, I proceeded to inject the desired solution in slow, incremental fashion, intermittently assessing for discomfort or any signs of abnormal or undesired spread of substance. Once completed, the needle was removed and disposed of, as per hospital protocols.   There were no vitals filed for this visit.  End Time:   hrs  Once the entire procedure was  completed, the treated area was cleaned, making sure to leave some of the prepping solution back to take advantage of its long term bactericidal properties.   Imaging guidance  Type of Imaging Technique: Fluoroscopy Guidance (Spinal) Indication(s): Assistance in needle guidance and placement for procedures requiring needle placement in or near specific anatomical locations not easily accessible without such assistance. Exposure Time: Please see nurses notes for exact fluoroscopy time. Contrast: Before injecting any contrast, we confirmed that the patient did not have an allergy to iodine, shellfish, or  radiological contrast. Once satisfactory needle placement was completed, radiological contrast was injected under continuous fluoroscopic guidance. Injection of contrast accomplished without complications. See chart for type and volume of contrast used. Fluoroscopic Guidance: I was personally present in the fluoroscopy suite, where the patient was placed in position for the procedure, over the fluoroscopy-compatible table. Fluoroscopy was manipulated, using "Tunnel Vision Technique", to obtain the best possible view of the target area, on the affected side. Parallax error was corrected before commencing the procedure. A "direction-depth-direction" technique was used to introduce the needle under continuous pulsed fluoroscopic guidance. Once the target was reached, antero-posterior, oblique, and lateral fluoroscopic projection views were taken to confirm needle placement in all planes. Electronic images uploaded into EMR.  Interpretation: Successful epidural injection. Intraoperative imaging interpretation by performing Physician.    Post-op assessment  Post-procedure Vital Signs:  Pulse/HCG Rate:    Temp:   Resp:   BP:   SpO2:    EBL: None  Complications: No immediate post-treatment complications observed by team, or reported by patient.  Note: The patient tolerated the entire procedure well. A  repeat set of vitals were taken after the procedure and the patient was kept under observation following institutional policy, for this type of procedure. Post-procedural neurological assessment was performed, showing return to baseline, prior to discharge. The patient was provided with post-procedure discharge instructions, including a section on how to identify potential problems. Should any problems arise concerning this procedure, the patient was given instructions to immediately contact us, at any time, without hesitation. In any case, we plan to contact the patient by telephone for a follow-up status report regarding this interventional procedure.  Comments:  No additional relevant information.   Plan of care  Chronic Opioid Analgesic:  Tramadol 50 mg, 2 tabs PO q 6 hrs (400 mg/day of tramadol) (LAST UDS DONE 02/21/2020. Scanned wrong into system) MME/day: 40 mg/day.   Medications administered: Jackelyn Knife had no medications administered during this visit.  Follow-up plan:   No follow-ups on file.      Interventional Therapies  Risk   Complexity Considerations:   CKD HTN   Planned   Pending:   Pending further evaluation   Under consideration:   Diagnostic right IA hip joint injection  Diagnostic bilateral lumbar facet block  Possible bilateral lumbar facet RFA    Completed:   Diagnostic left cervical ESI x1 (10/28/2020)  Therapeutic left L3-4 LESI x2 (06/13/2018)  Therapeutic left L3 TFESI x1 (03/13/2020)  Therapeutic left L4 TFESI x2 (03/13/2020)  Therapeutic right L3 TFESI x2 (03/13/2020)  Therapeutic right L4 TFESI x3 (03/13/2020)  Referral to neurosurgery Elayne Guerin, MD) for cervical instability (11/11/2020)    Therapeutic   Palliative (PRN) options:   Palliative left L3-4 LESI #3  Palliative left L3 TFESI #2  Palliative left L4 TFESI #3  Palliative right L3 TFESI #3  Palliative right L4 TFESI #4      Recent Visits Date Type Provider Dept  12/09/21  Office Visit Milinda Pointer, MD [facility]  Showing recent visits within past 90 days and meeting all other requirements Future Appointments Date Type Provider Dept  12/15/21 Appointment Milinda Pointer, MD [facility]  Showing future appointments within next 90 days and meeting all other requirements   Disposition: Discharge home  Discharge (Date   Time): 12/15/2021;   hrs.   Primary Care Physician: Duffy, Feliz Beam, MD Location: [facility] Note by: Gaspar Cola, MD Date: 12/15/2021; Time: 3:49  PM  DISCLAIMER: Medicine is not an Chief Strategy Officer. It has no guarantees or warranties. The decision to proceed with this intervention was based on the information collected from the patient. Conclusions were drawn from the patient's questionnaire, interview, and examination. Because information was provided in large part by the patient, it cannot be guaranteed that it has not been purposely or unconsciously manipulated or altered. Every effort has been made to obtain as much accurate, relevant, available data as possible. Always take into account that the treatment will also be dependent on availability of resources and existing treatment guidelines, considered by other Pain Management Specialists as being common knowledge and practice, at the time of the intervention. It is also important to point out that variation in procedural techniques and pharmacological choices are the acceptable norm. For Medico-Legal review purposes, the indications, contraindications, technique, and results of the these procedures should only be evaluated, judged and interpreted by a Board-Certified Interventional Pain Specialist with extensive familiarity and expertise in the same exact procedure and technique.

## 2021-12-15 ENCOUNTER — Other Ambulatory Visit: Payer: Self-pay

## 2021-12-15 ENCOUNTER — Encounter: Payer: Self-pay | Admitting: Pain Medicine

## 2021-12-15 ENCOUNTER — Ambulatory Visit
Admission: RE | Admit: 2021-12-15 | Discharge: 2021-12-15 | Disposition: A | Discharge status: Home / Self Care | Payer: Medicare HMO | Source: Ambulatory Visit | Attending: Pain Medicine | Admitting: Pain Medicine

## 2021-12-15 ENCOUNTER — Ambulatory Visit (HOSPITAL_BASED_OUTPATIENT_CLINIC_OR_DEPARTMENT_OTHER): Payer: Medicare HMO | Admitting: Pain Medicine

## 2021-12-15 VITALS — BP 129/84 | HR 90 | Temp 97.0°F | Resp 20 | Ht 72.0 in | Wt 185.0 lb

## 2021-12-15 DIAGNOSIS — M5412 Radiculopathy, cervical region: Secondary | ICD-10-CM

## 2021-12-15 DIAGNOSIS — R937 Abnormal findings on diagnostic imaging of other parts of musculoskeletal system: Secondary | ICD-10-CM

## 2021-12-15 DIAGNOSIS — M532X2 Spinal instabilities, cervical region: Secondary | ICD-10-CM

## 2021-12-15 DIAGNOSIS — M4722 Other spondylosis with radiculopathy, cervical region: Secondary | ICD-10-CM | POA: Insufficient documentation

## 2021-12-15 DIAGNOSIS — M4312 Spondylolisthesis, cervical region: Secondary | ICD-10-CM | POA: Insufficient documentation

## 2021-12-15 DIAGNOSIS — M542 Cervicalgia: Secondary | ICD-10-CM | POA: Insufficient documentation

## 2021-12-15 DIAGNOSIS — M47812 Spondylosis without myelopathy or radiculopathy, cervical region: Secondary | ICD-10-CM | POA: Diagnosis present

## 2021-12-15 DIAGNOSIS — M4802 Spinal stenosis, cervical region: Secondary | ICD-10-CM | POA: Diagnosis present

## 2021-12-15 DIAGNOSIS — M5030 Other cervical disc degeneration, unspecified cervical region: Secondary | ICD-10-CM | POA: Insufficient documentation

## 2021-12-15 MED ORDER — ROPIVACAINE HCL 2 MG/ML IJ SOLN
INTRAMUSCULAR | Status: AC
  Filled 2021-12-15: qty 20

## 2021-12-15 MED ORDER — IOHEXOL 180 MG/ML  SOLN
10.0000 mL | Freq: Once | INTRAMUSCULAR | Status: AC
  Administered 2021-12-15: 5 mL via EPIDURAL

## 2021-12-15 MED ORDER — SODIUM CHLORIDE 0.9% FLUSH
1.0000 mL | Freq: Once | INTRAVENOUS | Status: AC
  Administered 2021-12-15: 1 mL

## 2021-12-15 MED ORDER — ROPIVACAINE HCL 2 MG/ML IJ SOLN
1.0000 mL | Freq: Once | INTRAMUSCULAR | Status: AC
Start: 2021-12-15 — End: 2021-12-15
  Administered 2021-12-15: 1 mL via EPIDURAL

## 2021-12-15 MED ORDER — LIDOCAINE HCL 2 % IJ SOLN
20.0000 mL | Freq: Once | INTRAMUSCULAR | Status: AC
Start: 2021-12-15 — End: 2021-12-15
  Administered 2021-12-15: 400 mg

## 2021-12-15 MED ORDER — LIDOCAINE HCL 2 % IJ SOLN
INTRAMUSCULAR | Status: AC
  Filled 2021-12-15: qty 20

## 2021-12-15 MED ORDER — MIDAZOLAM HCL 5 MG/5ML IJ SOLN
0.5000 mg | Freq: Once | INTRAMUSCULAR | Status: DC
Start: 2021-12-15 — End: 2021-12-15

## 2021-12-15 MED ORDER — SODIUM CHLORIDE (PF) 0.9 % IJ SOLN
INTRAMUSCULAR | Status: AC
  Filled 2021-12-15: qty 10

## 2021-12-15 MED ORDER — PENTAFLUOROPROP-TETRAFLUOROETH EX AERO
INHALATION_SPRAY | Freq: Once | CUTANEOUS | Status: AC
  Administered 2021-12-15: 30 via TOPICAL
  Filled 2021-12-15: qty 116

## 2021-12-15 MED ORDER — LACTATED RINGERS IV SOLN: 1000.0000 mL | Freq: Once | INTRAVENOUS | Status: DC

## 2021-12-15 MED ORDER — DEXAMETHASONE SODIUM PHOSPHATE 10 MG/ML IJ SOLN
INTRAMUSCULAR | Status: AC
  Filled 2021-12-15: qty 1

## 2021-12-15 MED ORDER — DEXAMETHASONE SODIUM PHOSPHATE 10 MG/ML IJ SOLN
10.0000 mg | Freq: Once | INTRAMUSCULAR | Status: AC
  Administered 2021-12-15: 10 mg

## 2021-12-15 NOTE — Progress Notes (Signed)
Safety precautions to be maintained throughout the outpatient stay will include: orient to surroundings, keep bed in low position, maintain call bell within reach at all times, provide assistance with transfer out of bed and ambulation.  

## 2021-12-15 NOTE — Patient Instructions (Addendum)
____________________________________________________________________________________________ ? ?Post-Procedure Discharge Instructions ? ?Instructions: ?Apply ice:  ?Purpose: This will minimize any swelling and discomfort after procedure.  ?When: Day of procedure, as soon as you get home. ?How: Fill a plastic sandwich bag with crushed ice. Cover it with a small towel and apply to injection site. ?How long: (15 min on, 15 min off) Apply for 15 minutes then remove x 15 minutes.  Repeat sequence on day of procedure, until you go to bed. ?Apply heat:  ?Purpose: To treat any soreness and discomfort from the procedure. ?When: Starting the next day after the procedure. ?How: Apply heat to procedure site starting the day following the procedure. ?How long: May continue to repeat daily, until discomfort goes away. ?Food intake: Start with clear liquids (like water) and advance to regular food, as tolerated.  ?Physical activities: Keep activities to a minimum for the first 8 hours after the procedure. After that, then as tolerated. ?Driving: If you have received any sedation, be responsible and do not drive. You are not allowed to drive for 24 hours after having sedation. ?Blood thinner: (Applies only to those taking blood thinners) You may restart your blood thinner 6 hours after your procedure. ?Insulin: (Applies only to Diabetic patients taking insulin) As soon as you can eat, you may resume your normal dosing schedule. ?Infection prevention: Keep procedure site clean and dry. Shower daily and clean area with soap and water. ?Post-procedure Pain Diary: Extremely important that this be done correctly and accurately. Recorded information will be used to determine the next step in treatment. For the purpose of accuracy, follow these rules: ?Evaluate only the area treated. Do not report or include pain from an untreated area. For the purpose of this evaluation, ignore all other areas of pain, except for the treated area. ?After  your procedure, avoid taking a long nap and attempting to complete the pain diary after you wake up. Instead, set your alarm clock to go off every hour, on the hour, for the initial 8 hours after the procedure. Document the duration of the numbing medicine, and the relief you are getting from it. ?Do not go to sleep and attempt to complete it later. It will not be accurate. If you received sedation, it is likely that you were given a medication that may cause amnesia. Because of this, completing the diary at a later time may cause the information to be inaccurate. This information is needed to plan your care. ?Follow-up appointment: Keep your post-procedure follow-up evaluation appointment after the procedure (usually 2 weeks for most procedures, 6 weeks for radiofrequencies). DO NOT FORGET to bring you pain diary with you.  ? ?Expect: (What should I expect to see with my procedure?) ?From numbing medicine (AKA: Local Anesthetics): Numbness or decrease in pain. You may also experience some weakness, which if present, could last for the duration of the local anesthetic. ?Onset: Full effect within 15 minutes of injected. ?Duration: It will depend on the type of local anesthetic used. On the average, 1 to 8 hours.  ?From steroids (Applies only if steroids were used): Decrease in swelling or inflammation. Once inflammation is improved, relief of the pain will follow. ?Onset of benefits: Depends on the amount of swelling present. The more swelling, the longer it will take for the benefits to be seen. In some cases, up to 10 days. ?Duration: Steroids will stay in the system x 2 weeks. Duration of benefits will depend on multiple posibilities including persistent irritating factors. ?Side-effects: If present, they   may typically last 2 weeks (the duration of the steroids). ?Frequent: Cramps (if they occur, drink Gatorade and take over-the-counter Magnesium 450-500 mg once to twice a day); water retention with temporary  weight gain; increases in blood sugar; decreased immune system response; increased appetite. ?Occasional: Facial flushing (red, warm cheeks); mood swings; menstrual changes. ?Uncommon: Long-term decrease or suppression of natural hormones; bone thinning. (These are more common with higher doses or more frequent use. This is why we prefer that our patients avoid having any injection therapies in other practices.)  ?Very Rare: Severe mood changes; psychosis; aseptic necrosis. ?From procedure: Some discomfort is to be expected once the numbing medicine wears off. This should be minimal if ice and heat are applied as instructed. ? ?Call if: (When should I call?) ?You experience numbness and weakness that gets worse with time, as opposed to wearing off. ?New onset bowel or bladder incontinence. (Applies only to procedures done in the spine) ? ?Emergency Numbers: ?Durning business hours (Monday - Thursday, 8:00 AM - 4:00 PM) (Friday, 9:00 AM - 12:00 Noon): [phone] ?After hours: [phone] ?NOTE: If you are having a problem and are unable connect with, or to talk to a provider, then go to your nearest urgent care or emergency department. If the problem is serious and urgent, please call 911. ?____________________________________________________________________________________________ ? ____________________________________________________________________________________________ ? ?Virtual Visits  ? ?What is a "Virtual Visit"? ?It is a healthcare communication encounter (medical visit) that takes place on real time (NOT TEXT or E-MAIL) over the telephone or computer device (desktop, laptop, tablet, smart phone, etc.). It allows for more location flexibility between the patient and the healthcare provider. ? ?Who decides when these types of visits will be used? ?The physician. ? ?Who is eligible for these types of visits? ?Only those patients that can be reliably reached over the telephone. ? ?What do you mean by  reliably? ?We do not have time to call everyone multiple times, therefore those that tend to screen calls and then call back later are not suitable candidates for this system. We understand how people are reluctant to pickup on "unknown" calls, therefore, we suggest adding our telephone numbers to your list of "CONTACT(s)". This way, you should be able to readily identify our calls when you receive one. All of our numbers are available below.  ? ?Who is not eligible? ?This option is not available for medication management encounters, specially for controlled substances. Patients on pain medications that fall under the category of controlled substances have to come in for "Face-to-Face" encounters. This is required for mandatory monitoring of these substances. You may be asked to provide a sample for an unannounced urine drug screening test (UDS), and we will need to count your pain pills. Not bringing your pills to be counted may result in no refill. Obviously, neither one of these can be done over the phone. ? ?When will this type of visits be used? ?You can request a virtual visit whenever you are physically unable to attend a regular appointment. The decision will be made by the physician (or healthcare provider) on a case by case basis.  ? ?At what time will I be called? ?This is an excellent question. The providers will try to call you whenever they have time available. Do not expect to be called at any specific time. The secretaries will assign you a time for your virtual visit appointment, but this is done simply to keep a list of those patients that need to be called,   but not for the purpose of keeping a time schedule. Be advised that the call may come in anytime during the day, between the hours of 8:00 AM and 8::00 PM, depending on provider availability. We do understand that the system is not perfect. If you are unable to be available that day on a moments notice, then request an "in-person" appointment  rather than a "virtual visit". ? ?Can I request my medication visits to be "Virtual"? ?Yes you may request it, but the decision is entirely up to the healthcare provider. Control substances require specifi

## 2021-12-16 ENCOUNTER — Telehealth: Payer: Self-pay | Admitting: *Deleted

## 2021-12-16 ENCOUNTER — Encounter: Payer: Medicare HMO | Admitting: Pain Medicine

## 2021-12-16 NOTE — Telephone Encounter (Signed)
No problems post procedure. 

## 2021-12-27 NOTE — Progress Notes (Signed)
Patient: Caleb Edwards  Service Category: E/M  Provider: Gaspar Cola, MD  ?DOB: 1951/06/19  DOS: 12/30/2021  Location: Office  ?MRN: [id number]  Setting: Ambulatory outpatient  Referring Provider: Duffy, Feliz Beam, MD  ?Type: Established Patient  Specialty: Interventional Pain Management  PCP: Caleb Parr, MD  ?Location: Remote location  Delivery: TeleHealth    ? ?Virtual Encounter - Pain Management ?PROVIDER NOTE: Information contained herein reflects review and annotations entered in association with encounter. Interpretation of such information and data should be left to medically-trained personnel. Information provided to patient can be located elsewhere in the medical record under "Patient Instructions". Document created using STT-dictation technology, any transcriptional errors that may result from process are unintentional.  ?  ?Contact & Pharmacy ?Preferred: [phone] ?Home: [phone] (home) ?Mobile: [phone] (mobile) ?E-mail: [email or web address]  ?[facility] [address] [place] [address] ?[address]) [place] [address] ?Phone: [phone] Fax: [phone] ?  ?Pre-screening  ?Caleb Edwards offered "in-person" vs "virtual" encounter. He indicated preferring virtual for this encounter.  ? ?Reason ?COVID-19*  Social distancing based on CDC and AMA recommendations.  ? ?I contacted Caleb Edwards on 12/30/2021 via telephone.      I clearly identified myself as Caleb Cola, MD. I verified that I was speaking with the correct person using two identifiers (Name: Caleb Edwards, and date of birth: Jan 07, 1951). ? ?Consent ?I sought verbal advanced consent from Caleb Edwards for virtual visit interactions. I informed Caleb Edwards of possible security and privacy concerns, risks, and limitations associated with providing "not-in-person" medical evaluation and management services. I also informed Caleb Edwards of the availability of  "in-person" appointments. Finally, I informed him that there would be a charge for the virtual visit and that he could be  personally, fully or partially, financially responsible for it. Caleb Edwards expressed understanding and agreed to proceed.  ? ?Historic Elements   ?Caleb Edwards is a 71 y.o. year old, male patient evaluated today after our last contact on 12/15/2021. Caleb Edwards  has a past medical history of Back pain, Blood transfusion without reported diagnosis (12/2015), Diverticulitis, Hypertension, and Ulcer, stomach peptic, chronic. He also  has a past surgical history that includes Esophagogastroduodenoscopy (egd) with propofol (N/A, 01/03/2016); Colonoscopy with propofol (N/A, 08/02/2017); Esophagogastroduodenoscopy (egd) with propofol (08/02/2017); Esophagogastroduodenoscopy (egd) with propofol (N/A, 02/05/2018); Colonoscopy with propofol (N/A, 02/05/2018); Esophagogastroduodenoscopy (egd) with propofol (N/A, 03/30/2018); Esophagogastroduodenoscopy (egd) with propofol (N/A, 08/09/2019); Colonoscopy with propofol (N/A, 08/09/2019); Givens capsule study (N/A, 04/24/2020); and Esophagogastroduodenoscopy (egd) with propofol (N/A, 04/24/2020). Caleb Edwards has a current medication list which includes the following prescription(s): amlodipine, atorvastatin, chlorthalidone, ferrous sulfate, lisinopril, pantoprazole, polyethylene glycol, sildenafil, tramadol, and trazodone. He  reports that he has quit smoking. He has never used smokeless tobacco. He reports current alcohol use of about 1.0 standard drink per week. He reports that he does not use drugs. Caleb Edwards has No Known Allergies.  ? ?HPI  ?Today, he is being contacted for a post-procedure assessment.  The patient refers having attained an ongoing 90% relief of the pain.  He is very happy with the results. ? ?RTCB: 06/14/2022 ? ?Post-procedure evaluation  ?  Type: Cervical Epidural Steroid injection (ESI) (Interlaminar) #1  ?Laterality: Right  ?Level:  C7-T1 ?Imaging: Fluoroscopy-assisted ?DOS: 12/15/2021  ?Performed by: Milinda Pointer, MD ?Anesthesia: Local anesthesia (1-2% Lidocaine) ?Anxiolysis: None                 ?  Sedation: None.  ? ?Purpose: Diagnostic/Therapeutic ?Indications: Cervicalgia, cervical radicular pain, degenerative disc disease, severe enough to impact quality of life or function. ?1. Cervical radiculopathy (Right)   ?2. 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 Cervical spondylosis   ?4. Cervical spondylosis with radiculopathy   ?5. Cervicalgia   ?6. DDD (degenerative disc disease), cervical   ?7. Cervical foraminal stenosis (Right: C5-C8) (Left: C6-C7)   ?8. Grade 1 Anterolisthesis of cervical spine (1-2 mm) (C4/C5)   ? ?NAS-11 score:  ? Pre-procedure: 9 /10  ? Post-procedure: 0-No pain/10  ?   ?Effectiveness:  ?Initial hour after procedure: 90 %. ?Subsequent 4-6 hours post-procedure: 90 %. ?Analgesia past initial 6 hours: 90 %. ?Ongoing improvement:  ?Analgesic: The patient indicates having attained an ongoing 90% relief of his neck and upper extremity symptoms. ?Function: Caleb Edwards reports improvement in function ?ROM: Caleb Edwards reports improvement in ROM ? ?Pharmacotherapy Assessment  ? ?Opioid Analgesic: Tramadol 50 mg, 2 tabs PO q 6 hrs (400 mg/day of tramadol) (LAST UDS DONE 02/21/2020. Scanned wrong into system) ?MME/day: 40 mg/day.  ? ?Monitoring: ?[place] PMP: PDMP reviewed during this encounter.       ?Pharmacotherapy: No side-effects or adverse reactions reported. ?Compliance: No problems identified. ?Effectiveness: Clinically acceptable. ?Plan: Refer to "POC". UDS:  ?Summary  ?Date Value Ref Range Status  ?03/11/2021 Note  Final  ?  Comment:  ?  ==================================================================== ?[employer] Select 13 (MW) ?==================================================================== ?Test                             Result       Flag       Units ? ?Drug Present and Declared for Prescription Verification ?   Tramadol                       >3472        EXPECTED   ng/mg creat ?  O-Desmethyltramadol            >3472        EXPECTED   ng/mg creat ?  N-Desmethyltramadol            1831         EXPECTED   ng/mg creat ?   Source of tramadol is a prescription medication. O-desmethyltramadol ?   and N-desmethyltramadol are expected metabolites of tramadol. ? ?==================================================================== ?Test                      Result    Flag   Units      Ref Range ?  Creatinine              144              mg/dL      >=20 ?==================================================================== ?Declared Medications: ? The flagging and interpretation on this report are based on the ? following declared medications.  Unexpected results may arise from ? inaccuracies in the declared medications. ? ? **Note: The testing scope of this panel includes these medications: ? ? Tramadol (Ultram) ? ? **Note: The testing scope of this panel does not include the ? following reported medications: ? ? Amlodipine (Norvasc) ? Atorvastatin (Lipitor) ? Chlorthalidone (Hygroton) ? Iron ? Lisinopril (Zestril) ? Pantoprazole (Protonix) ? Polyethylene Glycol (MiraLAX) ? Sildenafil (Viagra) ? Trazodone (Desyrel) ?==================================================================== ?For clinical consultation, please call [phone]. ?==================================================================== ?  ?  ? ?Laboratory Chemistry Profile  ? ?  Renal ?Lab Results  ?Component Value Date  ? BUN 18 02/21/2020  ? CREATININE 1.24 02/21/2020  ? GFRAA >60 02/21/2020  ? GFRNONAA 60 (L) 02/21/2020  ?  Hepatic ?Lab Results  ?Component Value Date  ? AST 23 08/09/2019  ? ALT 15 08/09/2019  ? ALBUMIN 3.8 08/09/2019  ? ALKPHOS 53 08/09/2019  ? LIPASE 17 08/08/2019  ?  ?Electrolytes ?Lab Results  ?Component Value Date  ? NA 138 08/09/2019  ? K 3.9 08/09/2019  ? CL 106 08/09/2019  ? CALCIUM 8.7 (L) 08/09/2019  ? MG 1.9 01/04/2016  ?  Bone ?Lab  Results  ?Component Value Date  ? VD25OH 24.0 (L) 12/26/2015  ?  ?Inflammation (CRP: Acute Phase) (ESR: Chronic Phase) ?Lab Results  ?Component Value Date  ? CRP 0.6 12/26/2015  ? ESRSEDRATE 3 12/26/2015  ?    ?  ? ?Note

## 2021-12-30 ENCOUNTER — Ambulatory Visit: Payer: Medicare HMO | Attending: Pain Medicine | Admitting: Pain Medicine

## 2021-12-30 ENCOUNTER — Other Ambulatory Visit: Payer: Self-pay

## 2021-12-30 DIAGNOSIS — G894 Chronic pain syndrome: Secondary | ICD-10-CM | POA: Diagnosis not present

## 2021-12-30 DIAGNOSIS — M542 Cervicalgia: Secondary | ICD-10-CM

## 2021-12-30 DIAGNOSIS — M5412 Radiculopathy, cervical region: Secondary | ICD-10-CM

## 2021-12-30 NOTE — Patient Instructions (Signed)
____________________________________________________________________________________________ ? ?Medication Rules ? ?Purpose: To inform patients, and their family members, of our rules and regulations. ? ?Applies to: All patients receiving prescriptions (written or electronic). ? ?Pharmacy of record: Pharmacy where electronic prescriptions will be sent. If written prescriptions are taken to a different pharmacy, please inform the nursing staff. The pharmacy listed in the electronic medical record should be the one where you would like electronic prescriptions to be sent. ? ?Electronic prescriptions: In compliance with the [place] Strengthen Opioid Misuse Prevention (STOP) Act of 2017 (Session Law 2017-74/H243), effective October 04, 2018, all controlled substances must be electronically prescribed. Calling prescriptions to the pharmacy will cease to exist. ? ?Prescription refills: Only during scheduled appointments. Applies to all prescriptions. ? ?NOTE: The following applies primarily to controlled substances (Opioid* Pain Medications).  ? ?Type of encounter (visit): For patients receiving controlled substances, face-to-face visits are required. (Not an option or up to the patient.) ? ?Patient's responsibilities: ?Pain Pills: Bring all pain pills to every appointment (except for procedure appointments). ?Pill Bottles: Bring pills in original pharmacy bottle. Always bring the newest bottle. Bring bottle, even if empty. ?Medication refills: You are responsible for knowing and keeping track of what medications you take and those you need refilled. ?The day before your appointment: write a list of all prescriptions that need to be refilled. ?The day of the appointment: give the list to the admitting nurse. Prescriptions will be written only during appointments. No prescriptions will be written on procedure days. ?If you forget a medication: it will not be "Called in", "Faxed", or "electronically sent". You will  need to get another appointment to get these prescribed. ?No early refills. Do not call asking to have your prescription filled early. ?Prescription Accuracy: You are responsible for carefully inspecting your prescriptions before leaving our office. Have the discharge nurse carefully go over each prescription with you, before taking them home. Make sure that your name is accurately spelled, that your address is correct. Check the name and dose of your medication to make sure it is accurate. Check the number of pills, and the written instructions to make sure they are clear and accurate. Make sure that you are given enough medication to last until your next medication refill appointment. ?Taking Medication: Take medication as prescribed. When it comes to controlled substances, taking less pills or less frequently than prescribed is permitted and encouraged. ?Never take more pills than instructed. ?Never take medication more frequently than prescribed.  ?Inform other Doctors: Always inform, all of your healthcare providers, of all the medications you take. ?Pain Medication from other Providers: You are not allowed to accept any additional pain medication from any other Doctor or Healthcare provider. There are two exceptions to this rule. (see below) In the event that you require additional pain medication, you are responsible for notifying us, as stated below. ?Cough Medicine: Often these contain an opioid, such as codeine or hydrocodone. Never accept or take cough medicine containing these opioids if you are already taking an opioid* medication. The combination may cause respiratory failure and death. ?Medication Agreement: You are responsible for carefully reading and following our Medication Agreement. This must be signed before receiving any prescriptions from our practice. Safely store a copy of your signed Agreement. Violations to the Agreement will result in no further prescriptions. (Additional copies of our  Medication Agreement are available upon request.) ?Laws, Rules, & Regulations: All patients are expected to follow all Federal and State Laws, Statutes, Rules, & Regulations. Ignorance of   the Laws does not constitute a valid excuse.  ?Illegal drugs and Controlled Substances: The use of illegal substances (including, but not limited to marijuana and its derivatives) and/or the illegal use of any controlled substances is strictly prohibited. Violation of this rule may result in the immediate and permanent discontinuation of any and all prescriptions being written by our practice. The use of any illegal substances is prohibited. ?Adopted CDC guidelines & recommendations: Target dosing levels will be at or below 60 MME/day. Use of benzodiazepines** is not recommended. ? ?Exceptions: There are only two exceptions to the rule of not receiving pain medications from other Healthcare Providers. ?Exception #1 (Emergencies): In the event of an emergency (i.e.: accident requiring emergency care), you are allowed to receive additional pain medication. However, you are responsible for: As soon as you are able, call our office [phone], at any time of the day or night, and leave a message stating your name, the date and nature of the emergency, and the name and dose of the medication prescribed. In the event that your call is answered by a member of our staff, make sure to document and save the date, time, and the name of the person that took your information.  ?Exception #2 (Planned Surgery): In the event that you are scheduled by another doctor or dentist to have any type of surgery or procedure, you are allowed (for a period no longer than 30 days), to receive additional pain medication, for the acute post-op pain. However, in this case, you are responsible for picking up a copy of our "Post-op Pain Management for Surgeons" handout, and giving it to your surgeon or dentist. This document is available at our office, and  does not require an appointment to obtain it. Simply go to our office during business hours (Monday-Thursday from 8:00 AM to 4:00 PM) (Friday 8:00 AM to 12:00 Noon) or if you have a scheduled appointment with us, prior to your surgery, and ask for it by name. In addition, you are responsible for: calling our office [phone], at any time of the day or night, and leaving a message stating your name, name of your surgeon, type of surgery, and date of procedure or surgery. Failure to comply with your responsibilities may result in termination of therapy involving the controlled substances. ?Medication Agreement Violation. Following the above rules, including your responsibilities will help you in avoiding a Medication Agreement Violation (?Breaking your Pain Medication Contract?). ? ?*Opioid medications include: morphine, codeine, oxycodone, oxymorphone, hydrocodone, hydromorphone, meperidine, tramadol, tapentadol, buprenorphine, fentanyl, methadone. ?**Benzodiazepine medications include: diazepam (Valium), alprazolam (Xanax), clonazepam (Klonopine), lorazepam (Ativan), clorazepate (Tranxene), chlordiazepoxide (Librium), estazolam (Prosom), oxazepam (Serax), temazepam (Restoril), triazolam (Halcion) ?(Last updated: 07/01/2021) ?____________________________________________________________________________________________ ? ____________________________________________________________________________________________ ? ?Medication Recommendations and Reminders ? ?Applies to: All patients receiving prescriptions (written and/or electronic). ? ?Medication Rules & Regulations: These rules and regulations exist for your safety and that of others. They are not flexible and neither are we. Dismissing or ignoring them will be considered "non-compliance" with medication therapy, resulting in complete and irreversible termination of such therapy. (See document titled "Medication Rules" for more details.) In all conscience,  because of safety reasons, we cannot continue providing a therapy where the patient does not follow instructions. ? ?Pharmacy of record:  ?Definition: This is the pharmacy where your electronic prescriptions w

## 2022-05-27 ENCOUNTER — Other Ambulatory Visit: Payer: Self-pay | Admitting: Family Medicine

## 2022-05-27 DIAGNOSIS — R300 Dysuria: Secondary | ICD-10-CM

## 2022-05-28 ENCOUNTER — Ambulatory Visit
Admission: RE | Admit: 2022-05-28 | Discharge: 2022-05-28 | Disposition: A | Discharge status: Home / Self Care | Payer: Medicare HMO | Source: Ambulatory Visit | Attending: Family Medicine | Admitting: Family Medicine

## 2022-05-28 DIAGNOSIS — I700 Atherosclerosis of aorta: Secondary | ICD-10-CM | POA: Diagnosis not present

## 2022-05-28 DIAGNOSIS — R300 Dysuria: Secondary | ICD-10-CM | POA: Insufficient documentation

## 2022-05-28 DIAGNOSIS — N200 Calculus of kidney: Secondary | ICD-10-CM | POA: Insufficient documentation

## 2022-06-08 ENCOUNTER — Ambulatory Visit (INDEPENDENT_AMBULATORY_CARE_PROVIDER_SITE_OTHER): Payer: Medicare HMO | Admitting: Urology

## 2022-06-08 ENCOUNTER — Encounter: Payer: Self-pay | Admitting: Urology

## 2022-06-08 VITALS — BP 147/79 | HR 91 | Ht 72.0 in | Wt 185.0 lb

## 2022-06-08 DIAGNOSIS — N390 Urinary tract infection, site not specified: Secondary | ICD-10-CM | POA: Diagnosis not present

## 2022-06-08 DIAGNOSIS — R310 Gross hematuria: Secondary | ICD-10-CM

## 2022-06-08 DIAGNOSIS — N200 Calculus of kidney: Secondary | ICD-10-CM | POA: Diagnosis not present

## 2022-06-08 NOTE — Progress Notes (Signed)
06/08/22 1:33 PM   Asa Saunas 07-20-1951 [id number]  CC: UTI, gross hematuria, nephrolithiasis  HPI: 71 year old male referred for recent E. coli UTI with gross hematuria as well as bilateral nephrolithiasis.  He was actually seen by Dr. Sherryl Barters in urology in 2018 for gross hematuria and work-up showed multiple nonobstructive renal stones and cystoscopy showed an enlarged friable prostate but no suspicious bladder lesions.  He never followed up for surveillance KUB.  He recently developed dysuria and gross hematuria with clots and was found to have an E. coli UTI and treated with antibiotics with resolution of his urinary symptoms as well as hematuria.  He denies any complaints today.  They ordered a CT abdomen and pelvis with contrast which showed unchanged nonobstructing bilateral renal stones, no other significant abnormalities.  Bladder was decompressed and unable to evaluate.   PMH: Past Medical History:  Diagnosis Date   Back pain    Blood transfusion without reported diagnosis 12/2015   bleeding ulcer hospitalization.    Diverticulitis    Hypertension    Kidney stone    Ulcer, stomach peptic, chronic     Surgical History: Past Surgical History:  Procedure Laterality Date   COLONOSCOPY WITH PROPOFOL N/A 08/02/2017   Procedure: COLONOSCOPY WITH PROPOFOL;  Surgeon: Midge Minium, MD;  Location: [facility] ENDOSCOPY;  Service: Endoscopy;  Laterality: N/A;   COLONOSCOPY WITH PROPOFOL N/A 02/05/2018   Procedure: COLONOSCOPY WITH PROPOFOL;  Surgeon: Wyline Mood, MD;  Location: [facility] ENDOSCOPY;  Service: Gastroenterology;  Laterality: N/A;   COLONOSCOPY WITH PROPOFOL N/A 08/09/2019   Procedure: COLONOSCOPY WITH PROPOFOL;  Surgeon: Toledo, Cottier Nearing, MD;  Location: [facility] ENDOSCOPY;  Service: Gastroenterology;  Laterality: N/A;   ESOPHAGOGASTRODUODENOSCOPY (EGD) WITH PROPOFOL N/A 01/03/2016   Procedure: ESOPHAGOGASTRODUODENOSCOPY (EGD) WITH PROPOFOL;  Surgeon: Scot Jun, MD;  [facility]  [facility]COPY;  Service: Endoscopy;  Laterality: N/A;   ESOPHAGOGASTRODUODENOSCOPY (EGD) WITH PROPOFOL  08/02/2017   Procedure: ESOPHAGOGASTRODUODENOSCOPY (EGD) WITH PROPOFOL;  Surgeon: Midge Minium, MD;  Location: [facility] ENDOSCOPY;  Service: Endoscopy;;   ESOPHAGOGASTRODUODENOSCOPY (EGD) WITH PROPOFOL N/A 02/05/2018   Procedure: ESOPHAGOGASTRODUODENOSCOPY (EGD) WITH PROPOFOL;  Surgeon: Wyline Mood, MD;  Location: [facility] ENDOSCOPY;  Service: Gastroenterology;  Laterality: N/A;   ESOPHAGOGASTRODUODENOSCOPY (EGD) WITH PROPOFOL N/A 03/30/2018   Procedure: ESOPHAGOGASTRODUODENOSCOPY (EGD) WITH PROPOFOL WITH DILATION;  Surgeon: Wyline Mood, MD;  Location: [facility] ENDOSCOPY;  Service: Gastroenterology;  Laterality: N/A;   ESOPHAGOGASTRODUODENOSCOPY (EGD) WITH PROPOFOL N/A 08/09/2019   Procedure: ESOPHAGOGASTRODUODENOSCOPY (EGD) WITH PROPOFOL;  Surgeon: Toledo, Aycock Nearing, MD;  Location: [facility] ENDOSCOPY;  Service: Gastroenterology;  Laterality: N/A;   ESOPHAGOGASTRODUODENOSCOPY (EGD) WITH PROPOFOL N/A 04/24/2020   Procedure: ESOPHAGOGASTRODUODENOSCOPY (EGD) WITH PROPOFOL;  Surgeon: Wyline Mood, MD;  Location: [facility] ENDOSCOPY;  Service: Gastroenterology;  Laterality: N/A;   GIVENS CAPSULE STUDY N/A 04/24/2020   Procedure: GIVENS CAPSULE STUDY;  Surgeon: Wyline Mood, MD;  Location: [facility] ENDOSCOPY;  Service: Gastroenterology;  Laterality: N/A;   Family History: Family History  Problem Relation Age of Onset   Diabetes Mother    Heart disease Father    Prostate cancer Brother    Bladder Cancer Neg Hx    Kidney cancer Neg Hx     Social History:  reports that he has quit smoking. He has never used smokeless tobacco. He reports current alcohol use of about 1.0 standard drink of alcohol per week. He reports that he does not use drugs.  Physical Exam: BP (!) 147/79 (BP Location: Left Arm, Patient Position: Sitting, Cuff Size: Normal)  Pulse 91   Ht 6' (1.829 m)   Wt 185 lb (83.9 kg)   BMI 25.09 kg/m     Constitutional:  Alert and oriented, No acute distress. Cardiovascular: No clubbing, cyanosis, or edema. Respiratory: Normal respiratory effort, no increased work of breathing. GI: Abdomen is soft, nontender, nondistended, no abdominal masses   Laboratory Data: Reviewed, see HPI  Pertinent Imaging: I have personally viewed and interpreted the original CT urogram from 2018 as well as the recent CT abdomen and pelvis with contrast showing stable nonobstructing renal stones, no hydronephrosis, no ureteral stones, and bladder decompressed on recent CT.  Assessment & Plan:   71 year old male with recent UTI with significant hematuria and passage of clots and tissue.  CT abdomen pelvis essentially benign with stable nonobstructing renal stones unchanged since 2018.  With the significant degree of hematuria as well as tissue passage, I recommended cystoscopy for further evaluation with his recent UTI and significant hematuria.  We discussed common possible etiologies of hematuria including infection, BPH, malignancy, urolithiasis, medical renal disease, and idiopathic. Standard workup recommended by the [facility] includes imaging with CT urogram to assess the upper tracts, and cystoscopy. Cytology is performed on patient's with gross hematuria to look for malignant cells in the urine.  Return for cystoscopy  Legrand Rams, MD 06/08/2022  [facility] [address] [place], [place] [address] [phone]

## 2022-06-08 NOTE — Progress Notes (Signed)
No show to appointment.

## 2022-06-08 NOTE — Patient Instructions (Signed)

## 2022-06-08 NOTE — Patient Instructions (Signed)

## 2022-06-09 ENCOUNTER — Ambulatory Visit (HOSPITAL_BASED_OUTPATIENT_CLINIC_OR_DEPARTMENT_OTHER): Payer: Medicare HMO | Admitting: Pain Medicine

## 2022-06-09 DIAGNOSIS — M7918 Myalgia, other site: Secondary | ICD-10-CM

## 2022-06-09 DIAGNOSIS — Z91199 Patient's noncompliance with other medical treatment and regimen due to unspecified reason: Secondary | ICD-10-CM

## 2022-06-09 DIAGNOSIS — G894 Chronic pain syndrome: Secondary | ICD-10-CM

## 2022-06-09 DIAGNOSIS — G8929 Other chronic pain: Secondary | ICD-10-CM

## 2022-06-09 DIAGNOSIS — Z79899 Other long term (current) drug therapy: Secondary | ICD-10-CM

## 2022-06-09 DIAGNOSIS — M47816 Spondylosis without myelopathy or radiculopathy, lumbar region: Secondary | ICD-10-CM

## 2022-06-09 DIAGNOSIS — F1120 Opioid dependence, uncomplicated: Secondary | ICD-10-CM

## 2022-06-24 NOTE — Progress Notes (Signed)
PROVIDER NOTE: Information contained herein reflects review and annotations entered in association with encounter. Interpretation of such information and data should be left to medically-trained personnel. Information provided to patient can be located elsewhere in the medical record under "Patient Instructions". Document created using STT-dictation technology, any transcriptional errors that may result from process are unintentional.    Patient: Caleb Edwards  Service Category: E/M  Provider: Gaspar Cola, MD  DOB: 11/17/1950  DOS: 06/28/2022  Referring Provider: Cherylann Parr, MD  MRN: [id number]  Specialty: Interventional Pain Management  PCP: Cherylann Parr, MD  Type: Established Patient  Setting: Ambulatory outpatient    Location: Office  Delivery: Face-to-face     HPI  Caleb Edwards, a 71 y.o. year old male, is here today because of his Chronic pain syndrome [G89.4]. Caleb Edwards primary complain today is Neck Pain and Back Pain Last encounter: My last encounter with him was on 06/09/2022. 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) of L5 over S1; DDD (degenerative disc disease), lumbosacral; Lumbar facet arthropathy; Chronic hip pain (2ry area of Pain) (Bilateral) (R>L); Cervical radiculopathy (Left); Cervicalgia; DDD (degenerative disc disease), cervical; Abnormal x-ray of cervical spine; 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 Pain Assessment: Severity of Chronic pain is reported as a 8 /10. Location: Neck  /radiates down right shoulder. Onset: More than a month ago. Quality: Aching. Timing: Intermittent. Modifying factor(s): rest. Vitals:  height is 6' (1.829 m) and weight is 185 lb (83.9 kg). His temperature is 97 F (36.1 C) (abnormal). His blood pressure is 170/89 (abnormal) and his pulse is 99. His respiration is 18 and oxygen saturation is 98%.   Reason for encounter: medication management.  The patient indicates doing well with the current medication regimen. No adverse reactions or side effects reported to the medications.  The patient comes into the clinic today indicating that he is having pain in the midline of the cervical thoracic spine.  Evaluation reveals a possible trigger point/bursitis of the interspinous ligament at the C6-7 level.  Today I have offered the patient a trigger point injection of the area and he has accepted.  Because he did not bring his pills to be counted, we have provided the patient with only 1 month refill on his medications.  The patient was reminded that the pill counts are part of the monitoring process for the control substances.  He understood and accepted.  Routine UDS ordered today.  (His last prescription was filled on 05/31/2022 and should be good until 07/01/2022)  RTCB: 07/31/2022.  Pharmacotherapy Assessment  Analgesic: Tramadol 50 mg, 2 tabs PO q 6 hrs (400 mg/day of tramadol) (LAST UDS DONE 02/21/2020. Scanned wrong into system) MME/day: 40 mg/day.   Monitoring: [place] PMP: PDMP reviewed during this encounter.        Pharmacotherapy: No  side-effects or adverse reactions reported. Compliance: No problems identified. Effectiveness: Clinically acceptable.  Dewayne Shorter, RN  06/28/2022  3:10 PM  Sign when Signing Visit Nursing Pain Medication Assessment:  Safety precautions to be maintained throughout the outpatient stay will include: orient to surroundings, keep bed in low position, maintain call bell within reach at all times, provide assistance with transfer out of bed and ambulation.  Medication Inspection Compliance: Caleb Edwards did not comply with our request to bring his pills to be counted. He was reminded that bringing the medication bottles, even when empty, is a requirement.  Medication: None brought in. Pill/Patch Count: None available to be counted. Bottle Appearance: No container available. Did not bring bottle(s) to appointment. Filled Date: N/A Last Medication intake:  Today    No results found for: "CBDTHCR" No results found for: "D8THCCBX" No results found for: "D9THCCBX"  UDS:  Summary  Date Value Ref Range Status  03/11/2021 Note  Final    Comment:    ==================================================================== [employer] Select 13 (MW) ==================================================================== Test                             Result       Flag       Units  Drug Present and Declared for Prescription Verification   Tramadol                       >3472        EXPECTED   ng/mg creat   O-Desmethyltramadol            >3472        EXPECTED   ng/mg creat   N-Desmethyltramadol            1831         EXPECTED   ng/mg creat    Source of tramadol is a prescription medication. O-desmethyltramadol    and N-desmethyltramadol are expected metabolites of tramadol.  ==================================================================== Test                      Result    Flag   Units      Ref Range   Creatinine              144              mg/dL       >=20 ==================================================================== Declared Medications:  The flagging and interpretation on this report are based on the  following declared medications.  Unexpected results may arise from  inaccuracies in the declared medications.   **Note: The testing scope of this panel includes these medications:   Tramadol (Ultram)   **Note: The testing scope of this panel does not include the  following reported medications:   Amlodipine (Norvasc)  Atorvastatin (Lipitor)  Chlorthalidone (Hygroton)  Iron  Lisinopril (Zestril)  Pantoprazole (Protonix)  Polyethylene Glycol (MiraLAX)  Sildenafil (Viagra)  Trazodone (Desyrel) ==================================================================== For clinical consultation, please call [phone]. ====================================================================       ROS  Constitutional: Denies any fever or chills Gastrointestinal: No reported hemesis, hematochezia, vomiting, or acute GI distress Musculoskeletal: Denies any acute onset joint swelling, redness, loss of ROM, or weakness Neurological: No reported episodes of acute onset apraxia, aphasia, dysarthria, agnosia, amnesia, paralysis, loss of coordination, or loss of consciousness  Medication Review  amLODipine, chlorthalidone, ferrous sulfate, ibuprofen, lisinopril, pantoprazole, rosuvastatin, sildenafil, traMADol, and traZODone  History Review  Allergy:  Caleb Edwards has No Known Allergies. Drug: Caleb Edwards  reports no history of drug use. Alcohol:  reports current alcohol use of about 1.0 standard drink of alcohol per week. Tobacco:  reports that he has quit smoking. He has never used smokeless tobacco. Social: Caleb Edwards  reports that he has quit smoking. He has never used smokeless tobacco. He reports current alcohol use of about 1.0 standard drink of alcohol per week. He reports that he does not use drugs. Medical:  has a past medical  history of Back pain, Blood transfusion without reported diagnosis (12/2015), Diverticulitis, Hypertension, Kidney stone, and Ulcer, stomach peptic, chronic. Surgical: Caleb Edwards  has a past surgical history that includes Esophagogastroduodenoscopy (egd) with propofol (N/A, 01/03/2016); Colonoscopy with propofol (N/A, 08/02/2017); Esophagogastroduodenoscopy (egd) with propofol (08/02/2017); Esophagogastroduodenoscopy (egd) with propofol (N/A, 02/05/2018); Colonoscopy with propofol (N/A, 02/05/2018); Esophagogastroduodenoscopy (egd) with propofol (N/A, 03/30/2018); Esophagogastroduodenoscopy (egd) with propofol (N/A, 08/09/2019); Colonoscopy with propofol (N/A, 08/09/2019); Givens capsule study (N/A, 04/24/2020); and Esophagogastroduodenoscopy (egd) with propofol (N/A, 04/24/2020). Family: family history includes Diabetes in his mother; Heart disease in his father; Prostate cancer in his brother.  Laboratory Chemistry Profile   Renal Lab Results  Component Value Date   BUN 18 02/21/2020   CREATININE 1.24 02/21/2020   GFRAA >60 02/21/2020   GFRNONAA 60 (L) 02/21/2020    Hepatic Lab Results  Component Value Date   AST 23 08/09/2019   ALT 15 08/09/2019   ALBUMIN 3.8 08/09/2019   ALKPHOS 53 08/09/2019   LIPASE 17 08/08/2019    Electrolytes Lab Results  Component Value Date   NA 138 08/09/2019   K 3.9 08/09/2019   CL 106 08/09/2019   CALCIUM 8.7 (L) 08/09/2019   MG 1.9 01/04/2016    Bone Lab Results  Component Value Date   VD25OH 24.0 (L) 12/26/2015    Inflammation (CRP: Acute Phase) (ESR: Chronic Phase) Lab Results  Component Value Date   CRP 0.6 12/26/2015   ESRSEDRATE 3 12/26/2015         Note: Above Lab results reviewed.  Recent Imaging Review  CT ABDOMEN PELVIS WO CONTRAST CLINICAL DATA:  71 year old male with dysuria, hematuria and bilateral flank pain.  EXAM: CT ABDOMEN AND PELVIS WITHOUT CONTRAST  TECHNIQUE: Multidetector CT imaging of the abdomen and pelvis was  performed following the standard protocol without IV contrast.  RADIATION DOSE REDUCTION: This exam was performed according to the departmental dose-optimization program which includes automated exposure control, adjustment of the mA and/or kV according to patient size and/or use of iterative reconstruction technique.  COMPARISON:  03/19/2020 and prior CTs  FINDINGS: Please note that parenchymal and vascular abnormalities may be missed as intravenous contrast was not administered.  Lower chest: No acute abnormality. Subcentimeter basilar nodules are unchanged from 2017 and considered benign.  Hepatobiliary: The liver and gallbladder are unremarkable. There is no evidence of intrahepatic or extrahepatic biliary dilatation.  Pancreas: Unremarkable  Spleen: Unremarkable  Adrenals/Urinary Tract: Bilateral nephrolithiasis again noted with an 8 mm RIGHT LOWER pole calculus, 6 mm LEFT LOWER pole calculus and a 7 mm mid LEFT renal calculus. There is no evidence of hydronephrosis, obstructing urinary calculus or suspicious renal mass.  Increased density within the bladder may represent complicated fluid/blood, given this patient's history of hematuria. No other definite bladder abnormalities identified on this noncontrast study.  The adrenal glands are unremarkable.  Stomach/Bowel: Stomach is within normal limits. Appendix appears normal. No evidence of bowel wall thickening, distention, or inflammatory changes. Colonic diverticulosis  identified without evidence of acute diverticulitis.  Vascular/Lymphatic: Aortic atherosclerosis. No enlarged abdominal or pelvic lymph nodes.  Reproductive: Mild prostate enlargement again noted.  Other: Insert no ascites  Musculoskeletal: No acute or suspicious bony abnormalities are noted. Grade 1 anterolisthesis of L5 on S1 and multilevel degenerative disc disease/spondylosis and facet arthropathy again noted.  IMPRESSION: 1. Increased  density within the bladder, which may represent complicated fluid/blood, given this patient's history of hematuria. No other gross bladder abnormality identified. Clinical follow-up recommended. 2. Bilateral nephrolithiasis. No evidence of hydronephrosis or obstructing urinary calculi. 3. Aortic Atherosclerosis (ICD10-I70.0).  Electronically Signed   By: Margarette Canada M.D.   On: 05/30/2022 09:54 Note: Reviewed        Physical Exam  General appearance: Well nourished, well developed, and well hydrated. In no apparent acute distress Mental status: Alert, oriented x 3 (person, place, & time)       Respiratory: No evidence of acute respiratory distress Eyes: PERLA Vitals: BP (!) 170/89   Pulse 99   Temp (!) 97 F (36.1 C)   Resp 18   Ht 6' (1.829 m)   Wt 185 lb (83.9 kg)   SpO2 98%   BMI 25.09 kg/m  BMI: Estimated body mass index is 25.09 kg/m as calculated from the following:   Height as of this encounter: 6' (1.829 m).   Weight as of this encounter: 185 lb (83.9 kg). Ideal: Ideal body weight: 77.6 kg (171 lb 1.2 oz) Adjusted ideal body weight: 80.1 kg (176 lb 10.3 oz)  Assessment   Diagnosis Status  1. Chronic pain syndrome   2. Chronic low back pain (1ry area of Pain) (Bilateral) (R>L) w/o sciatica   3. Chronic hip pain (2ry area of Pain) (Bilateral) (R>L)   4. Chronic lower extremity pain (3ry area of Pain) (Bilateral) (R>L)   5. 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 Chronic neck pain   7. Diffuse myofascial pain syndrome   8. Lumbar facet syndrome (Bilateral) (R>L)   9. Pharmacologic therapy   10. Uncomplicated opioid dependence ([place])   11. Encounter for medication management   12. Encounter for chronic pain management   13. 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  Caleb Edwards has a current medication list which includes the following long-term medication(s): chlorthalidone, lisinopril, pantoprazole, rosuvastatin, trazodone, and [START ON 07/01/2022] tramadol.  Pharmacotherapy (Medications Ordered): Meds ordered this encounter  Medications   traMADol (ULTRAM) 50 MG tablet    Sig: Take 2 tablets (100 mg total) by mouth every 6 (six) hours. Each refill must last 30 days.    Dispense:  240 tablet    Refill:  0    DO NOT: delete (not duplicate); no partial-fill (will deny script to complete), no refill request (F/U required). DISPENSE: 1 day early if closed on fill date. WARN: No CNS-depressants within 8 hrs of med.   pentafluoroprop-tetrafluoroeth (GEBAUERS) aerosol   lidocaine (XYLOCAINE) 2 % (with pres) injection 400 mg   ropivacaine (PF) 2 mg/mL (0.2%) (NAROPIN) injection 4 mL   methylPREDNISolone acetate (DEPO-MEDROL) injection 80 mg   Orders:  Orders Placed This Encounter  Procedures   TRIGGER POINT INJECTION    Scheduling Instructions:     Area: Neck     Side: Midline  Sedation: No Sedation.     Timeframe: Today    Order Specific Question:   Where will this procedure be performed?    Answer:   [facility]   Tox[employer]SU[employer] Select 13 (MW), Urine    Volume: 30 ml(s). Minimum 3 ml of urine is needed. Document temperature of fresh sample. Indications: Long term (current) use of opiate analgesic (Z79.891)    Order Specific Question:   Release to patient    Answer:   Immediate   Provide equipment / supplies at bedside    "Block Tray" (Disposable  single use) Needle type: SpinalRegular Amount/quantity: 2 Size: Short(1.5-inch) Gauge: (25G x1) + (22G x1)    Standing Status:   Standing    Number of Occurrences:   1    Order Specific Question:   Specify    Answer:   Block Tray   Follow-up plan:   Return in about 1 month (around 07/31/2022) for Eval-day (M,W), (F2F), (MM) +   (PPE).     Interventional Therapies  Risk  Complexity Considerations:   Estimated body mass index is 25.09 kg/m as calculated from the following:   Height as of this encounter: 6' (1.829 m).   Weight as of this encounter: 185 lb (83.9 kg). CKD HTN   Planned  Pending:   Therapeutic right cervical ESI #1 (12/15/2021)    Under consideration:   Diagnostic right IA hip joint injection  Diagnostic bilateral lumbar facet block  Possible bilateral lumbar facet RFA    Completed:   Diagnostic bilateral lumbar facet MBB x1 (06/30/2021)  Therapeutic left cervical ESI x2 (02/05/2021)  Therapeutic right cervical ESI x1 (12/15/2021) (90/90/90/90)  Therapeutic left L3-4 LESI x2 (06/13/2018)  Therapeutic left L3 TFESI x1 (03/13/2020)  Therapeutic left L4 TFESI x2 (03/13/2020)  Therapeutic right L3 TFESI x2 (03/13/2020)  Therapeutic right L4 TFESI x3 (03/13/2020)  Referral to neurosurgery Elayne Guerin, MD) for cervical instability (11/11/2020)    Therapeutic  Palliative (PRN) options:   Palliative left L3-4 LESI #3  Palliative left L3 TFESI #2  Palliative left L4 TFESI #3  Palliative right L3 TFESI #3  Palliative right L4 TFESI #4       Recent Visits No visits were found meeting these conditions. Showing recent visits within past 90 days and meeting all other requirements Today's Visits Date Type Provider Dept  06/28/22 Office Visit Milinda Pointer, MD [facility]  Showing today's visits and meeting all other requirements Future Appointments No visits were found meeting these conditions. Showing future appointments within next 90 days and meeting all other requirements  I discussed the assessment and treatment plan with the patient. The patient was provided an opportunity to ask questions and all were answered. The patient agreed with the plan and demonstrated an understanding of the instructions.  Patient advised to call back or seek an in-person evaluation if the  symptoms or condition worsens.  Duration of encounter: 30 minutes.  Total time on encounter, as per AMA guidelines included both the face-to-face and non-face-to-face time personally spent by the physician and/or other qualified health care professional(s) on the day of the encounter (includes time in activities that require the physician or other qualified health care professional and does not include time in activities normally performed by clinical staff). Physician's time may include the following activities when performed: preparing to see the patient (eg, review of tests, pre-charting review of records) obtaining and/or reviewing separately obtained history performing a medically appropriate examination and/or evaluation counseling and educating  the patient/family/caregiver ordering medications, tests, or procedures referring and communicating with other health care professionals (when not separately reported) documenting clinical information in the electronic or other health record independently interpreting results (not separately reported) and communicating results to the patient/ family/caregiver care coordination (not separately reported)  Note by: Gaspar Cola, MD Date: 06/28/2022; Time: 3:31 PM

## 2022-06-28 ENCOUNTER — Ambulatory Visit: Payer: Medicare HMO | Attending: Pain Medicine | Admitting: Pain Medicine

## 2022-06-28 ENCOUNTER — Encounter: Payer: Self-pay | Admitting: Pain Medicine

## 2022-06-28 VITALS — BP 165/72 | HR 89 | Temp 97.0°F | Resp 18 | Ht 72.0 in | Wt 185.0 lb

## 2022-06-28 DIAGNOSIS — M79604 Pain in right leg: Secondary | ICD-10-CM | POA: Insufficient documentation

## 2022-06-28 DIAGNOSIS — M545 Low back pain, unspecified: Secondary | ICD-10-CM | POA: Diagnosis not present

## 2022-06-28 DIAGNOSIS — M542 Cervicalgia: Secondary | ICD-10-CM | POA: Insufficient documentation

## 2022-06-28 DIAGNOSIS — M47816 Spondylosis without myelopathy or radiculopathy, lumbar region: Secondary | ICD-10-CM | POA: Diagnosis present

## 2022-06-28 DIAGNOSIS — M25552 Pain in left hip: Secondary | ICD-10-CM | POA: Insufficient documentation

## 2022-06-28 DIAGNOSIS — M25551 Pain in right hip: Secondary | ICD-10-CM | POA: Diagnosis present

## 2022-06-28 DIAGNOSIS — G894 Chronic pain syndrome: Secondary | ICD-10-CM | POA: Diagnosis not present

## 2022-06-28 DIAGNOSIS — M79605 Pain in left leg: Secondary | ICD-10-CM | POA: Diagnosis present

## 2022-06-28 DIAGNOSIS — Z79899 Other long term (current) drug therapy: Secondary | ICD-10-CM | POA: Insufficient documentation

## 2022-06-28 DIAGNOSIS — F1120 Opioid dependence, uncomplicated: Secondary | ICD-10-CM | POA: Diagnosis present

## 2022-06-28 DIAGNOSIS — M7918 Myalgia, other site: Secondary | ICD-10-CM | POA: Insufficient documentation

## 2022-06-28 DIAGNOSIS — M719 Bursopathy, unspecified: Secondary | ICD-10-CM | POA: Insufficient documentation

## 2022-06-28 DIAGNOSIS — G8929 Other chronic pain: Secondary | ICD-10-CM | POA: Diagnosis present

## 2022-06-28 DIAGNOSIS — M5416 Radiculopathy, lumbar region: Secondary | ICD-10-CM | POA: Insufficient documentation

## 2022-06-28 MED ORDER — ROPIVACAINE HCL 2 MG/ML IJ SOLN
4.0000 mL | Freq: Once | INTRAMUSCULAR | Status: AC
  Administered 2022-06-28: 4 mL

## 2022-06-28 MED ORDER — LIDOCAINE HCL 2 % IJ SOLN
20.0000 mL | Freq: Once | INTRAMUSCULAR | Status: AC
Start: 2022-06-28 — End: 2022-06-28
  Administered 2022-06-28: 400 mg

## 2022-06-28 MED ORDER — PENTAFLUOROPROP-TETRAFLUOROETH EX AERO
INHALATION_SPRAY | Freq: Once | CUTANEOUS | Status: DC
  Filled 2022-06-28: qty 116

## 2022-06-28 MED ORDER — METHYLPREDNISOLONE ACETATE 80 MG/ML IJ SUSP
80.0000 mg | Freq: Once | INTRAMUSCULAR | Status: AC
  Administered 2022-06-28: 80 mg via INTRA_ARTICULAR

## 2022-06-28 MED ORDER — ROPIVACAINE HCL 2 MG/ML IJ SOLN
INTRAMUSCULAR | Status: AC
  Filled 2022-06-28: qty 20

## 2022-06-28 MED ORDER — TRAMADOL HCL 50 MG PO TABS: 100.0000 mg | ORAL_TABLET | Freq: Four times a day (QID) | ORAL | 0 refills | Status: DC

## 2022-06-28 MED ORDER — METHYLPREDNISOLONE ACETATE 80 MG/ML IJ SUSP
INTRAMUSCULAR | Status: AC
  Filled 2022-06-28: qty 1

## 2022-06-28 MED ORDER — LIDOCAINE HCL (PF) 1 % IJ SOLN
INTRAMUSCULAR | Status: AC
  Filled 2022-06-28: qty 5

## 2022-06-28 NOTE — Progress Notes (Signed)
Nursing Pain Medication Assessment:  Safety precautions to be maintained throughout the outpatient stay will include: orient to surroundings, keep bed in low position, maintain call bell within reach at all times, provide assistance with transfer out of bed and ambulation.  Medication Inspection Compliance: Caleb Edwards did not comply with our request to bring his pills to be counted. He was reminded that bringing the medication bottles, even when empty, is a requirement.  Medication: None brought in. Pill/Patch Count: None available to be counted. Bottle Appearance: No container available. Did not bring bottle(s) to appointment. Filled Date: N/A Last Medication intake:  Today

## 2022-06-28 NOTE — Patient Instructions (Addendum)
____________________________________________________________________________________________  Pharmacy Shortages of Pain Medication   Introduction Shockingly as it may seem, .  "No [place] Supreme Court decision has ever interpreted the Constitution as guaranteeing a right to health care for all Americans." - https://www.healthequityandpolicylab.com/elusive-right-to-health-care-under-us-law  "With respect to human rights, the [place] has no formally codified right to health, nor does it participate in a human rights treaty that specifies a right to health." - Scott J. Schweikart, JD, MBE  Situation By now, most of our patients have had the experience of being told by their pharmacist that they do not have enough medication to cover their prescription. If you have not had this experience, just know that you soon will.  Problem There appears to be a shortage of these medications, either at the national level or locally. This is happening with all pharmacies. When there is not enough medication, patients are offered a partial fill and they are told that they will try to get the rest of the medicine for them at a later time. If they do not have enough for even a partial fill, the pharmacists are telling the patients to call us (the prescribing physicians) to request that we send another prescription to another pharmacy to get the medicine.   This reordering of a controlled substance creates documentation problems where additional paperwork needs to be created to explain why two prescriptions for the same period of time and the same medicine are being prescribed to the same patient. It also creates situations where the last appointment note does not accurately reflect when and what prescriptions were given to a patient. This leads to prescribing errors down the line, in subsequent follow-up visits.   [place] Board [employer]acy ([place]) Research revealed that Board of Pharmacy Rule .1806 (21  [place]AC 46.1806) authorizes pharmacists to the transfer of prescriptions among pharmacies, and it sets forth procedural and recordkeeping requirements for doing so. However, this requires the pharmacist to complete the previously mentioned procedural paperwork to accomplish the transfer. As it turns out, it is much easier for them to have the prescribing physicians do the work.   Possible solutions 1. You can ask your physician to assist you in weaning yourself off these medications. 2. Ask your pharmacy if the medication is in stock, 3 days prior to your refill. 3. If you need a pharmacy change, let us know at your medication management visit. Prescriptions that have already been electronically sent to a pharmacy will not be re-sent to a different pharmacy if your pharmacy of record does not have it in stock. Proper stocking of medication is a pharmacy problem, not a prescriber problem. Work with your pharmacist to solve the problem. 4. Have the  State Assembly add a provision to the "STOP ACT" (the law that mandates how controlled substances are prescribed) where there is an exception to the electronic prescribing rule that states that in the event there are shortages of medications the physicians are allowed to use written prescriptions as opposed to electronic ones. This would allow patients to take their prescriptions to a different pharmacy that may have enough medication available to fill the prescription. The problem is that currently there is a law that does not allow for written prescriptions, with the exception of instances where the electronic medical record is down due to technical issues.  5. Have [place] Congress ease the pressure on pharmaceutical companies, allowing them to produce enough quantities of the medication to adequately supply the population. 6. Have pharmacies keep enough   stocks of these medications to cover their client base.  7. Have the [place] Assembly add  a provision to the "STOP ACT" where they ease the regulations surrounding the transfer of controlled substances between pharmacies, so as to simplify the transfer of supplies. As an alternative, develop a system to allow patients to obtain the remainder of their prescription at another one of their pharmacies or at an associate pharmacy.   How this shortage will affect you.  Understand that this is a pharmacy supply problem, not a prescriber problem. Work with your pharmacy to solve it. The job of the prescriber is to evaluate and monitor the patient for the appropriate indications and use of these medicines. It is not the job of the prescriber to supply the medication or to solve problems with that supply. The responsibility and the choice to obtain the medication resides on the patient. By law, supplying the medication is the job of the pharmacy. It is certainly not the job of the prescriber to solve supply problems.   Due to the above problems we are no longer taking patients to write for their pain medication. Future discussions with your physician may include potentially weaning medications or transitioning to alternatives.  We will be focusing primarily on interventional based pain management. We will continue to evaluate for appropriate indications and we may provide recommendations regarding medication, dose, and schedule, as well as monitoring recommendations, however, we will not be taking over the actual prescribing of these substances. On those patients where we are treating their chronic pain with interventional therapies, exceptions will be considered on a case by case basis. At this time, we will try to continue providing this supplemental service to those patients we have been managing in the past. However, as of August 1st, 2023, we no longer will be sending additional prescriptions to other pharmacies for the purpose of solving their supply problems. Once we send a prescription to a pharmacy,  we will not be resending it again to another pharmacy to cover for their shortages.   What to do. Write as many letters as you can. Recruit the help of family members in writing these letters. Below are some of the places where you can write to make your voice heard. Let them know what the problem is and push them to look for solutions.   Search internet for: "[place] find your legislators" https://www.ncleg.gov/findyourlegislators  Search internet for: "[employer] insurance commissioner complaints" https://www.ncdoi.gov/contactscomplaints/assistance-or-file-complaint  Search internet for: "[place] Board of Pharmacy complaints" http://www.ncbop.org/contact.htm  Search internet for: "[facility] pharmacy complaints" Email [facility] Customer Relations https://www.[facility].com/help/email-customer-relations.jsp?callType=store  Search internet for: "[facility] pharmacy customer service complaints" https://www.[facility].com/topic/marketing/contactus/contactus_customerservice.jsp  ____________________________________________________________________________________________  ____________________________________________________________________________________________  Medication Rules  Purpose: To inform patients, and their family members, of our rules and regulations.  Applies to: All patients receiving prescriptions (written or electronic).  Pharmacy of record: Pharmacy where electronic prescriptions will be sent. If written prescriptions are taken to a different pharmacy, please inform the nursing staff. The pharmacy listed in the electronic medical record should be the one where you would like electronic prescriptions to be sent.  Electronic prescriptions: In compliance with the [place] Strengthen Opioid Misuse Prevention (STOP) Act of 2017 (Session Law 2017-74/H243), effective October 04, 2018, all controlled substances must be electronically prescribed. Calling prescriptions  to the pharmacy will cease to exist.  Prescription refills: Only during scheduled appointments. Applies to all prescriptions.  NOTE: The following applies primarily to controlled substances (Opioid* Pain Medications).     Type of encounter (visit): For patients receiving controlled substances, face-to-face visits are required. (Not an option or up to the patient.)  Patient's responsibilities: Pain Pills: Bring all pain pills to every appointment (except for procedure appointments). Pill Bottles: Bring pills in original pharmacy bottle. Always bring the newest bottle. Bring bottle, even if empty. Medication refills: You are responsible for knowing and keeping track of what medications you take and those you need refilled. The day before your appointment: write a list of all prescriptions that need to be refilled. The day of the appointment: give the list to the admitting nurse. Prescriptions will be written only during appointments. No prescriptions will be written on procedure days. If you forget a medication: it will not be "Called in", "Faxed", or "electronically sent". You will need to get another appointment to get these prescribed. No early refills. Do not call asking to have your prescription filled early. Prescription Accuracy: You are responsible for carefully inspecting your prescriptions before leaving our office. Have the discharge nurse carefully go over each prescription with you, before taking them home. Make sure that your name is accurately spelled, that your address is correct. Check the name and dose of your medication to make sure it is accurate. Check the number of pills, and the written instructions to make sure they are clear and accurate. Make sure that you are given enough medication to last until your next medication refill appointment. Taking Medication: Take medication as prescribed. When it comes to controlled substances, taking less pills or less frequently than prescribed  is permitted and encouraged. Never take more pills than instructed. Never take medication more frequently than prescribed.  Inform other Doctors: Always inform, all of your healthcare providers, of all the medications you take. Pain Medication from other Providers: You are not allowed to accept any additional pain medication from any other Doctor or Healthcare provider. There are two exceptions to this rule. (see below) In the event that you require additional pain medication, you are responsible for notifying us, as stated below. Cough Medicine: Often these contain an opioid, such as codeine or hydrocodone. Never accept or take cough medicine containing these opioids if you are already taking an opioid* medication. The combination may cause respiratory failure and death. Medication Agreement: You are responsible for carefully reading and following our Medication Agreement. This must be signed before receiving any prescriptions from our practice. Safely store a copy of your signed Agreement. Violations to the Agreement will result in no further prescriptions. (Additional copies of our Medication Agreement are available upon request.) Laws, Rules, & Regulations: All patients are expected to follow all Federal and State Laws, Statutes, Rules, & Regulations. Ignorance of the Laws does not constitute a valid excuse.  Illegal drugs and Controlled Substances: The use of illegal substances (including, but not limited to marijuana and its derivatives) and/or the illegal use of any controlled substances is strictly prohibited. Violation of this rule may result in the immediate and permanent discontinuation of any and all prescriptions being written by our practice. The use of any illegal substances is prohibited. Adopted CDC guidelines & recommendations: Target dosing levels will be at or below 60 MME/day. Use of benzodiazepines** is not recommended.  Exceptions: There are only two exceptions to the rule of not  receiving pain medications from other Healthcare Providers. Exception #1 (Emergencies): In the event of an emergency (i.e.: accident requiring emergency care), you are allowed to receive additional pain medication. However, you are responsible for: As soon as   you are able, call our office [phone], at any time of the day or night, and leave a message stating your name, the date and nature of the emergency, and the name and dose of the medication prescribed. In the event that your call is answered by a member of our staff, make sure to document and save the date, time, and the name of the person that took your information.  Exception #2 (Planned Surgery): In the event that you are scheduled by another doctor or dentist to have any type of surgery or procedure, you are allowed (for a period no longer than 30 days), to receive additional pain medication, for the acute post-op pain. However, in this case, you are responsible for picking up a copy of our "Post-op Pain Management for Surgeons" handout, and giving it to your surgeon or dentist. This document is available at our office, and does not require an appointment to obtain it. Simply go to our office during business hours (Monday-Thursday from 8:00 AM to 4:00 PM) (Friday 8:00 AM to 12:00 Noon) or if you have a scheduled appointment with us, prior to your surgery, and ask for it by name. In addition, you are responsible for: calling our office [phone], at any time of the day or night, and leaving a message stating your name, name of your surgeon, type of surgery, and date of procedure or surgery. Failure to comply with your responsibilities may result in termination of therapy involving the controlled substances. Medication Agreement Violation. Following the above rules, including your responsibilities will help you in avoiding a Medication Agreement Violation ("Breaking your Pain Medication Contract").  *Opioid medications include: morphine,  codeine, oxycodone, oxymorphone, hydrocodone, hydromorphone, meperidine, tramadol, tapentadol, buprenorphine, fentanyl, methadone. **Benzodiazepine medications include: diazepam (Valium), alprazolam (Xanax), clonazepam (Klonopine), lorazepam (Ativan), clorazepate (Tranxene), chlordiazepoxide (Librium), estazolam (Prosom), oxazepam (Serax), temazepam (Restoril), triazolam (Halcion) (Last updated: 07/01/2021) ____________________________________________________________________________________________  ____________________________________________________________________________________________  Medication Recommendations and Reminders  Applies to: All patients receiving prescriptions (written and/or electronic).  Medication Rules & Regulations: These rules and regulations exist for your safety and that of others. They are not flexible and neither are we. Dismissing or ignoring them will be considered "non-compliance" with medication therapy, resulting in complete and irreversible termination of such therapy. (See document titled "Medication Rules" for more details.) In all conscience, because of safety reasons, we cannot continue providing a therapy where the patient does not follow instructions.  Pharmacy of record:  Definition: This is the pharmacy where your electronic prescriptions will be sent.  We do not endorse any particular pharmacy, however, we have experienced problems with [facility] not securing enough medication supply for the community. We do not restrict you in your choice of pharmacy. However, once we write for your prescriptions, we will NOT be re-sending more prescriptions to fix restricted supply problems created by your pharmacy, or your insurance.  The pharmacy listed in the electronic medical record should be the one where you want electronic prescriptions to be sent. If you choose to change pharmacy, simply notify our nursing staff.  Recommendations: Keep all of your pain  medications in a safe place, under lock and key, even if you live alone. We will NOT replace lost, stolen, or damaged medication. After you fill your prescription, take 1 week's worth of pills and put them away in a safe place. You should keep a separate, properly labeled bottle for this purpose. The remainder should be kept in the original bottle. Use this as your primary supply, until   it runs out. Once it's gone, then you know that you have 1 week's worth of medicine, and it is time to come in for a prescription refill. If you do this correctly, it is unlikely that you will ever run out of medicine. To make sure that the above recommendation works, it is very important that you make sure your medication refill appointments are scheduled at least 1 week before you run out of medicine. To do this in an effective manner, make sure that you do not leave the office without scheduling your next medication management appointment. Always ask the nursing staff to show you in your prescription , when your medication will be running out. Then arrange for the receptionist to get you a return appointment, at least 7 days before you run out of medicine. Do not wait until you have 1 or 2 pills left, to come in. This is very poor planning and does not take into consideration that we may need to cancel appointments due to bad weather, sickness, or emergencies affecting our staff. DO NOT ACCEPT A "Partial Fill": If for any reason your pharmacy does not have enough pills/tablets to completely fill or refill your prescription, do not allow for a "partial fill". The law allows the pharmacy to complete that prescription within 72 hours, without requiring a new prescription. If they do not fill the rest of your prescription within those 72 hours, you will need a separate prescription to fill the remaining amount, which we will NOT provide. If the reason for the partial fill is your insurance, you will need to talk to the pharmacist  about payment alternatives for the remaining tablets, but again, DO NOT ACCEPT A PARTIAL FILL, unless you can trust your pharmacist to obtain the remainder of the pills within 72 hours.  Prescription refills and/or changes in medication(s):  Prescription refills, and/or changes in dose or medication, will be conducted only during scheduled medication management appointments. (Applies to both, written and electronic prescriptions.) No refills on procedure days. No medication will be changed or started on procedure days. No changes, adjustments, and/or refills will be conducted on a procedure day. Doing so will interfere with the diagnostic portion of the procedure. No phone refills. No medications will be "called into the pharmacy". No Fax refills. No weekend refills. No Holliday refills. No after hours refills.  Remember:  Business hours are:  Monday to Thursday 8:00 AM to 4:00 PM Provider's Schedule: Francisco Naveira, MD - Appointments are:  Medication management: Monday and Wednesday 8:00 AM to 4:00 PM Procedure day: Tuesday and Thursday 7:30 AM to 4:00 PM Bilal Lateef, MD - Appointments are:  Medication management: Tuesday and Thursday 8:00 AM to 4:00 PM Procedure day: Monday and Wednesday 7:30 AM to 4:00 PM (Last update: 04/23/2020) ____________________________________________________________________________________________  ____________________________________________________________________________________________  CBD (cannabidiol) & Delta-8 (Delta-8 tetrahydrocannabinol) WARNING  Intro: Cannabidiol (CBD) and tetrahydrocannabinol (THC), are two natural compounds found in plants of the Cannabis genus. They can both be extracted from hemp or cannabis. Hemp and cannabis come from the Cannabis sativa plant. Both compounds interact with your body's endocannabinoid system, but they have very different effects. CBD does not produce the high sensation associated with cannabis. Delta-8  tetrahydrocannabinol, also known as delta-8 THC, is a psychoactive substance found in the Cannabis sativa plant, of which marijuana and hemp are two varieties. THC is responsible for the high associated with the illicit use of marijuana.  Applicable to: All individuals currently taking or considering taking CBD (cannabidiol) and,   more important, all patients taking opioid analgesic controlled substances (pain medication). (Example: oxycodone; oxymorphone; hydrocodone; hydromorphone; morphine; methadone; tramadol; tapentadol; fentanyl; buprenorphine; butorphanol; dextromethorphan; meperidine; codeine; etc.)  Legal status: CBD remains a Schedule I drug prohibited for any use. CBD is illegal with one exception. In the [place], CBD has a limited Food and Drug Administration (FDA) approval for the treatment of two specific types of epilepsy disorders. Only one CBD product has been approved by the FDA for this purpose: "Epidiolex". FDA is aware that some companies are marketing products containing cannabis and cannabis-derived compounds in ways that violate the Federal Food, Drug and Cosmetic Act (FD&C Act) and that may put the health and safety of consumers at risk. The FDA, a Federal agency, has not enforced the CBD status since 2018. UPDATE: (11/20/2021) The Drug Enforcement Agency (DEA) issued a letter stating that "delta" cannabinoids, including Delta-8-THCO and Delta-9-THCO, synthetically derived from hemp do not qualify as hemp and will be viewed as Schedule I drugs. (Schedule I drugs, substances, or chemicals are defined as drugs with no currently accepted medical use and a high potential for abuse. Some examples of Schedule I drugs are: heroin, lysergic acid diethylamide (LSD), marijuana (cannabis), 3,4-methylenedioxymethamphetamine (ecstasy), methaqualone, and peyote.) (https://www.dea.gov)  Legality: Some manufacturers ship CBD products nationally, which is illegal. Often such products are sold  online and are therefore available throughout the country. CBD is openly sold in head shops and health food stores in some states where such sales have not been explicitly legalized. Selling unapproved products with unsubstantiated therapeutic claims is not only a violation of the law, but also can put patients at risk, as these products have not been proven to be safe or effective. Federal illegality makes it difficult to conduct research on CBD.  Reference: "FDA Regulation of Cannabis and Cannabis-Derived Products, Including Cannabidiol (CBD)" - https://www.fda.gov/news-events/public-health-focus/fda-regulation-cannabis-and-cannabis-derived-products-including-cannabidiol-cbd  Warning: CBD is not FDA approved and has not undergo the same manufacturing controls as prescription drugs.  This means that the purity and safety of available CBD may be questionable. Most of the time, despite manufacturer's claims, it is contaminated with THC (delta-9-tetrahydrocannabinol - the chemical in marijuana responsible for the "HIGH").  When this is the case, the THC contaminant will trigger a positive urine drug screen (UDS) test for Marijuana (carboxy-THC). Because a positive UDS for any illicit substance is a violation of our medication agreement, your opioid analgesics (pain medicine) may be permanently discontinued. The FDA recently put out a warning about 5 things that everyone should be aware of regarding Delta-8 THC: Delta-8 THC products have not been evaluated or approved by the FDA for safe use and may be marketed in ways that put the public health at risk. The FDA has received adverse event reports involving delta-8 THC-containing products. Delta-8 THC has psychoactive and intoxicating effects. Delta-8 THC manufacturing often involve use of potentially harmful chemicals to create the concentrations of delta-8 THC claimed in the marketplace. The final delta-8 THC product may have potentially harmful by-products  (contaminants) due to the chemicals used in the process. Manufacturing of delta-8 THC products may occur in uncontrolled or unsanitary settings, which may lead to the presence of unsafe contaminants or other potentially harmful substances. Delta-8 THC products should be kept out of the reach of children and pets.  MORE ABOUT CBD  General Information: CBD was discovered in 1940 and it is a derivative of the cannabis sativa genus plants (Marijuana and Hemp). It is one of the 113 identified substances found in Marijuana.   It accounts for up to 40% of the plant's extract. As of 2018, preliminary clinical studies on CBD included research for the treatment of anxiety, movement disorders, and pain. CBD is available and consumed in multiple forms, including inhalation of smoke or vapor, as an aerosol spray, and by mouth. It may be supplied as an oil containing CBD, capsules, dried cannabis, or as a liquid solution. CBD is thought not to be as psychoactive as THC (delta-9-tetrahydrocannabinol - the chemical in marijuana responsible for the "HIGH"). Studies suggest that CBD may interact with different biological target receptors in the body, including cannabinoid and other neurotransmitter receptors. As of 2018 the mechanism of action for its biological effects has not been determined.  Side-effects  Adverse reactions: Dry mouth, diarrhea, decreased appetite, fatigue, drowsiness, malaise, weakness, sleep disturbances, and others.  Drug interactions: CBC may interact with other medications such as blood-thinners. Because CBD causes drowsiness on its own, it also increases the drowsiness caused by other medications, including antihistamines (such as Benadryl), benzodiazepines (Xanax, Ativan, Valium), antipsychotics, antidepressants and opioids, as well as alcohol and supplements such as kava, melatonin and St. John's Wort. Be cautious with the following combinations:   Brivaracetam (Briviact) Brivaracetam is changed  and broken down by the body. CBD might decrease how quickly the body breaks down brivaracetam. This might increase levels of brivaracetam in the body.  Caffeine Caffeine is changed and broken down by the body. CBD might decrease how quickly the body breaks down caffeine. This might increase levels of caffeine in the body.  Carbamazepine (Tegretol) Carbamazepine is changed and broken down by the body. CBD might decrease how quickly the body breaks down carbamazepine. This might increase levels of carbamazepine in the body and increase its side effects.  Citalopram (Celexa) Citalopram is changed and broken down by the body. CBD might decrease how quickly the body breaks down citalopram. This might increase levels of citalopram in the body and increase its side effects.  Clobazam (Onfi) Clobazam is changed and broken down by the liver. CBD might decrease how quickly the liver breaks down clobazam. This might increase the effects and side effects of clobazam.  Eslicarbazepine (Aptiom) Eslicarbazepine is changed and broken down by the body. CBD might decrease how quickly the body breaks down eslicarbazepine. This might increase levels of eslicarbazepine in the body by a small amount.  Everolimus (Zostress) Everolimus is changed and broken down by the body. CBD might decrease how quickly the body breaks down everolimus. This might increase levels of everolimus in the body.  Lithium Taking higher doses of CBD might increase levels of lithium. This can increase the risk of lithium toxicity.  Medications changed by the liver (Cytochrome P450 1A1 (CYP1A1) substrates) Some medications are changed and broken down by the liver. CBD might change how quickly the liver breaks down these medications. This could change the effects and side effects of these medications.  Medications changed by the liver (Cytochrome P450 1A2 (CYP1A2) substrates) Some medications are changed and broken down by the liver. CBD  might change how quickly the liver breaks down these medications. This could change the effects and side effects of these medications.  Medications changed by the liver (Cytochrome P450 1B1 (CYP1B1) substrates) Some medications are changed and broken down by the liver. CBD might change how quickly the liver breaks down these medications. This could change the effects and side effects of these medications.  Medications changed by the liver (Cytochrome P450 2A6 (CYP2A6) substrates) Some medications are changed and   broken down by the liver. CBD might change how quickly the liver breaks down these medications. This could change the effects and side effects of these medications.  Medications changed by the liver (Cytochrome P450 2B6 (CYP2B6) substrates) Some medications are changed and broken down by the liver. CBD might change how quickly the liver breaks down these medications. This could change the effects and side effects of these medications.  Medications changed by the liver (Cytochrome P450 2C19 (CYP2C19) substrates) Some medications are changed and broken down by the liver. CBD might change how quickly the liver breaks down these medications. This could change the effects and side effects of these medications.  Medications changed by the liver (Cytochrome P450 2C8 (CYP2C8) substrates) Some medications are changed and broken down by the liver. CBD might change how quickly the liver breaks down these medications. This could change the effects and side effects of these medications.  Medications changed by the liver (Cytochrome P450 2C9 (CYP2C9) substrates) Some medications are changed and broken down by the liver. CBD might change how quickly the liver breaks down these medications. This could change the effects and side effects of these medications.  Medications changed by the liver (Cytochrome P450 2D6 (CYP2D6) substrates) Some medications are changed and broken down by the liver. CBD might  change how quickly the liver breaks down these medications. This could change the effects and side effects of these medications.  Medications changed by the liver (Cytochrome P450 2E1 (CYP2E1) substrates) Some medications are changed and broken down by the liver. CBD might change how quickly the liver breaks down these medications. This could change the effects and side effects of these medications.  Medications changed by the liver (Cytochrome P450 3A4 (CYP3A4) substrates) Some medications are changed and broken down by the liver. CBD might change how quickly the liver breaks down these medications. This could change the effects and side effects of these medications.  Medications changed by the liver (Glucuronidated drugs) Some medications are changed and broken down by the liver. CBD might change how quickly the liver breaks down these medications. This could change the effects and side effects of these medications.  Medications that decrease the breakdown of other medications by the liver (Cytochrome P450 2C19 (CYP2C19) inhibitors) CBD is changed and broken down by the liver. Some drugs decrease how quickly the liver changes and breaks down CBD. This could change the effects and side effects of CBD.  Medications that decrease the breakdown of other medications in the liver (Cytochrome P450 3A4 (CYP3A4) inhibitors) CBD is changed and broken down by the liver. Some drugs decrease how quickly the liver changes and breaks down CBD. This could change the effects and side effects of CBD.  Medications that increase breakdown of other medications by the liver (Cytochrome P450 3A4 (CYP3A4) inducers) CBD is changed and broken down by the liver. Some drugs increase how quickly the liver changes and breaks down CBD. This could change the effects and side effects of CBD.  Medications that increase the breakdown of other medications by the liver (Cytochrome P450 2C19 (CYP2C19) inducers) CBD is changed and  broken down by the liver. Some drugs increase how quickly the liver changes and breaks down CBD. This could change the effects and side effects of CBD.  Methadone (Dolophine) Methadone is broken down by the liver. CBD might decrease how quickly the liver breaks down methadone. Taking cannabidiol along with methadone might increase the effects and side effects of methadone.  Rufinamide (Banzel) Rufinamide is   changed and broken down by the body. CBD might decrease how quickly the body breaks down rufinamide. This might increase levels of rufinamide in the body by a small amount.  Sedative medications (CNS depressants) CBD might cause sleepiness and slowed breathing. Some medications, called sedatives, can also cause sleepiness and slowed breathing. Taking CBD with sedative medications might cause breathing problems and/or too much sleepiness.  Sirolimus (Rapamune) Sirolimus is changed and broken down by the body. CBD might decrease how quickly the body breaks down sirolimus. This might increase levels of sirolimus in the body.  Stiripentol (Diacomit) Stiripentol is changed and broken down by the body. CBD might decrease how quickly the body breaks down stiripentol. This might increase levels of stiripentol in the body and increase its side effects.  Tacrolimus (Prograf) Tacrolimus is changed and broken down by the body. CBD might decrease how quickly the body breaks down tacrolimus. This might increase levels of tacrolimus in the body.  Tamoxifen (Soltamox) Tamoxifen is changed and broken down by the body. CBD might affect how quickly the body breaks down tamoxifen. This might affect levels of tamoxifen in the body.  Topiramate (Topamax) Topiramate is changed and broken down by the body. CBD might decrease how quickly the body breaks down topiramate. This might increase levels of topiramate in the body by a small amount.  Valproate Valproic acid can cause liver injury. Taking cannabidiol  with valproic acid might increase the chance of liver injury. CBD and/or valproic acid might need to be stopped, or the dose might need to be reduced.  Warfarin (Coumadin) CBD might increase levels of warfarin, which can increase the risk for bleeding. CBD and/or warfarin might need to be stopped, or the dose might need to be reduced.  Zonisamide Zonisamide is changed and broken down by the body. CBD might decrease how quickly the body breaks down zonisamide. This might increase levels of zonisamide in the body by a small amount. (Last update: 12/02/2021) ____________________________________________________________________________________________  ____________________________________________________________________________________________  Drug Holidays (Slow)  What is a "Drug Holiday"? Drug Holiday: is the name given to the period of time during which a patient stops taking a medication(s) for the purpose of eliminating tolerance to the drug.  Benefits Improved effectiveness of opioids. Decreased opioid dose needed to achieve benefits. Improved pain with lesser dose.  What is tolerance? Tolerance: is the progressive decreased in effectiveness of a drug due to its repetitive use. With repetitive use, the body gets use to the medication and as a consequence, it loses its effectiveness. This is a common problem seen with opioid pain medications. As a result, a larger dose of the drug is needed to achieve the same effect that used to be obtained with a smaller dose.  How long should a "Drug Holiday" last? You should stay off of the pain medicine for at least 14 consecutive days. (2 weeks)  Should I stop the medicine "cold turkey"? No. You should always coordinate with your Pain Specialist so that he/she can provide you with the correct medication dose to make the transition as smoothly as possible.  How do I stop the medicine? Slowly. You will be instructed to decrease the daily amount of  pills that you take by one (1) pill every seven (7) days. This is called a "slow downward taper" of your dose. For example: if you normally take four (4) pills per day, you will be asked to drop this dose to three (3) pills per day for seven (7) days, then to   two (2) pills per day for seven (7) days, then to one (1) per day for seven (7) days, and at the end of those last seven (7) days, this is when the "Drug Holiday" would start.   Will I have withdrawals? By doing a "slow downward taper" like this one, it is unlikely that you will experience any significant withdrawal symptoms. Typically, what triggers withdrawals is the sudden stop of a high dose opioid therapy. Withdrawals can usually be avoided by slowly decreasing the dose over a prolonged period of time. If you do not follow these instructions and decide to stop your medication abruptly, withdrawals may be possible.  What are withdrawals? Withdrawals: refers to the wide range of symptoms that occur after stopping or dramatically reducing opiate drugs after heavy and prolonged use. Withdrawal symptoms do not occur to patients that use low dose opioids, or those who take the medication sporadically. Contrary to benzodiazepine (example: Valium, Xanax, etc.) or alcohol withdrawals ("Delirium Tremens"), opioid withdrawals are not lethal. Withdrawals are the physical manifestation of the body getting rid of the excess receptors.  Expected Symptoms Early symptoms of withdrawal may include: Agitation Anxiety Muscle aches Increased tearing Insomnia Runny nose Sweating Yawning  Late symptoms of withdrawal may include: Abdominal cramping Diarrhea Dilated pupils Goose bumps Nausea Vomiting  Will I experience withdrawals? Due to the slow nature of the taper, it is very unlikely that you will experience any.  What is a slow taper? Taper: refers to the gradual decrease in dose.  (Last update:  04/23/2020) ____________________________________________________________________________________________   Post-procedure Information What to expect: Most procedures involve the use of a local anesthetic (numbing medicine), and a steroid (anti-inflammatory medicine).  The local anesthetics may cause temporary numbness and weakness of the legs or arms, depending on the location of the block. This numbness/weakness may last 4-6 hours, depending on the local anesthetic used. In rare instances, it can last up to 24 hours. While numb, you must be very careful not to injure the extremity.  After any procedure, you could expect the pain to get better within 15-20 minutes. This relief is temporary and may last 4-6 hours. Once the local anesthetics wears off, you could experience discomfort, possibly more than usual, for up to 10 (ten) days. In the case of radiofrequencies, it may last up to 6 weeks. Surgeries may take up to 8 weeks for the healing process. The discomfort is due to the irritation caused by needles going through skin and muscle. To minimize the discomfort, we recommend using ice the first day, and heat from then on. The ice should be applied for 15 minutes on, and 15 minutes off. Keep repeating this cycle until bedtime. Avoid applying the ice directly to the skin, to prevent frostbite. Heat should be used daily, until the pain improves (4-10 days). Be careful not to burn yourself.  Occasionally you may experience muscle spasms or cramps. These occur as a consequence of the irritation caused by the needle sticks to the muscle and the blood that will inevitably be lost into the surrounding muscle tissue. Blood tends to be very irritating to tissues, which tend to react by going into spasm. These spasms may start the same day of your procedure, but they may also take days to develop. This late onset type of spasm or cramp is usually caused by electrolyte imbalances triggered by the steroids, at the  level of the kidney. Cramps and spasms tend to respond well to muscle relaxants, multivitamins (some are triggered  by the procedure, but may have their origins in vitamin deficiencies), and "Gatorade", or any sports drinks that can replenish any electrolyte imbalances. (If you are a diabetic, ask your pharmacist to get you a sugar-free brand.) Warm showers or baths may also be helpful. Stretching exercises are highly recommended. General Instructions:  Be alert for signs of possible infection: redness, swelling, heat, red streaks, elevated temperature, and/or fever. These typically appear 4 to 6 days after the procedure. Immediately notify your doctor if you experience unusual bleeding, difficulty breathing, or loss of bowel or bladder control. If you experience increased pain, do not increase your pain medicine intake, unless instructed by your pain physician. Post-Procedure Care:  Be careful in moving about. Muscle spasms in the area of the injection may occur. Applying ice or heat to the area is often helpful. The incidence of spinal headaches after epidural injections ranges between 1.4% and 6%. If you develop a headache that does not seem to respond to conservative therapy, please let your physician know. This can be treated with an epidural blood patch.   Post-procedure numbness or redness is to be expected, however it should average 4 to 6 hours. If numbness and weakness of your extremities begins to develop 4 to 6 hours after your procedure, and is felt to be progressing and worsening, immediately contact your physician.   Diet:  If you experience nausea, do not eat until this sensation goes away. If you had a "Stellate Ganglion Block" for upper extremity "Reflex Sympathetic Dystrophy", do not eat or drink until your hoarseness goes away. In any case, always start with liquids first and if you tolerate them well, then slowly progress to more solid foods. Activity:  For the first 4 to 6 hours after  the procedure, use caution in moving about as you may experience numbness and/or weakness. Use caution in cooking, using household electrical appliances, and climbing steps. If you need to reach your Doctor call our office: [phone] Monday-Thursday 8:00 am - 4:00 PM    Fridays: Closed     In case of an emergency: In case of emergency, call 911 or go to the nearest emergency room and have the physician there call us.  Interpretation of Procedure Every nerve block has two components: a diagnostic component, and a treatment component. Unrealistic expectations are the most common causes of "perceived failure".  In a perfect world, a single nerve block should be able to completely and permanently eliminate the pain. Sadly, the world is not perfect.  Most pain management nerve blocks are performed using local anesthetics and steroids. Steroids are responsible for any long-term benefit that you may experience. Their purpose is to decrease any chronic swelling that may exist in the area. Steroids begin to work immediately after being injected. However, most patients will not experience any benefits until 5 to 10 days after the injection, when the swelling has come down to the point where they can tell a difference. Steroids will only help if there is swelling to be treated. As such, they can assist with the diagnosis. If effective, they suggest an inflammatory component to the pain, and if ineffective, they rule out inflammation as the main cause or component of the problem. If the problem is one of mechanical compression, you will get no benefit from those steroids.   In the case of local anesthetics, they have a crucial role in the diagnosis of your condition. Most will begin to work within15 to 20 minutes after injection. The  duration will depend on the type used (short- vs. Long-acting). It is of outmost importance that patients keep tract of their pain, after the procedure. To assist with this matter,  a "Post-procedure Pain Diary" is provided. Make sure to complete it and to bring it back to your follow-up appointment.  As long as the patient keeps accurate, detailed records of their symptoms after every procedure, and returns to have those interpreted, every procedure will provide [place] with invaluable information. Even a block that does not provide the patient with any relief, will always provide [place] with information about the mechanism and the origin of the pain. The only time a nerve block can be considered a waste of time is when patients do not keep track of the results, or do not keep their post-procedure appointment.  Reporting the results back to your physician The Pain Score  Pain is a subjective complaint. It cannot be seen, touched, or measured. We depend entirely on the patient's report of the pain in order to assess your condition and treatment. To evaluate the pain, we use a pain scale, where "0" means "No Pain", and a "10" is "the worst possible pain that you can even imagine" (i.e. something like been eaten alive by a shark or being torn apart by a lion).   You will frequently be asked to rate your pain. Please be as accurate, remember that medical decisions will be based on your responses. Please do not rate your pain above a 10. Doing so is actually interpreted as "symptom magnification" (exaggeration), as well as lack of understanding with regards to the scale. To put this into perspective, when you tell us that your pain is at a 10 (ten), what you are saying is that there is nothing we can do to make this pain any worse. (Carefully think about that.)

## 2022-06-28 NOTE — Progress Notes (Signed)
PROVIDER NOTE: Interpretation of information contained herein should be left to medically-trained personnel. Specific patient instructions are provided elsewhere under "Patient Instructions" section of medical record. This document was created in part using STT-dictation technology, any transcriptional errors that may result from this process are unintentional.  Patient: Caleb Edwards Type: Established DOB: July 03, 1951 MRN: [id number] PCP: Antonieta Loveless, MD  Service: Procedure DOS: 06/28/2022 Setting: Ambulatory Location: Ambulatory outpatient facility Delivery: Face-to-face Provider: Oswaldo Done, MD Specialty: Interventional Pain Management Specialty designation: 09 Location: Outpatient facility Ref. Prov.: Duffy, Barnabas Lister, MD    Primary Reason for Visit: Interventional Pain Management Treatment. CC: Neck Pain and Back Pain    Procedure:          Anesthesia, Analgesia, Anxiolysis:  Type: C6-7 interspinous process Trigger Point Injection (1-2 muscle groups) #1  CPT: 20552 Primary Purpose: Therapeutic Region: Posterior Cervicothoracic Level: Cervical Target Area: C6-7 interspinous bursa Trigger Point Approach: Percutaneous, ipsilateral approach. Laterality: Midline        Type: Local Anesthesia Local Anesthetic: Lidocaine 1-2% Sedation: None  Indication(s):  Analgesia Route: Infiltration ([place]/IM) IV Access: N/A   Position: Sitting   1. Chronic neck pain   2. 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 Mcpheeters is a 71 y.o. (year old), male patient, seen today for interventional treatment. He  has a past surgical history that includes Esophagogastroduodenoscopy (egd) with propofol (N/A, 01/03/2016); Colonoscopy with propofol (N/A, 08/02/2017); Esophagogastroduodenoscopy (egd) with propofol (08/02/2017); Esophagogastroduodenoscopy (egd) with propofol (N/A, 02/05/2018);  Colonoscopy with propofol (N/A, 02/05/2018); Esophagogastroduodenoscopy (egd) with propofol (N/A, 03/30/2018); Esophagogastroduodenoscopy (egd) with propofol (N/A, 08/09/2019); Colonoscopy with propofol (N/A, 08/09/2019); Givens capsule study (N/A, 04/24/2020); and Esophagogastroduodenoscopy (egd) with propofol (N/A, 04/24/2020). Mr. Owensby has a current medication list which includes the following prescription(s): amlodipine, chlorthalidone, ferrous sulfate, ibuprofen, lisinopril, pantoprazole, rosuvastatin, sildenafil, trazodone, and [START ON 07/01/2022] tramadol, and the following Facility-Administered Medications: pentafluoroprop-tetrafluoroeth. His primarily concern today is the Neck Pain and Back Pain  Initial Vital Signs:  Pulse/HCG Rate: 99  Temp: (!) 97 F (36.1 C) Resp: 18 BP: (!) 170/89 SpO2: 98 %  BMI: Estimated body mass index is 25.09 kg/m as calculated from the following:   Height as of this encounter: 6' (1.829 m).   Weight as of this encounter: 185 lb (83.9 kg).  Risk Assessment: Allergies: Reviewed. He has No Known Allergies.  Allergy Precautions: None required Coagulopathies: Reviewed. None identified.  Blood-thinner therapy: None at this time Active Infection(s): Reviewed. None identified. Mr. Perrow is afebrile  Site Confirmation: Mr. Oki was asked to confirm the procedure and laterality before marking the site Procedure checklist: Completed Consent: Before the procedure and under the influence of no sedative(s), amnesic(s), or anxiolytics, the patient was informed of the treatment options, risks and possible complications. To fulfill our ethical and legal obligations, as recommended by the American Medical Association's Code of Ethics, I have informed the patient of my clinical impression; the nature and purpose of the treatment or procedure; the risks, benefits, and possible complications of the intervention; the alternatives, including doing nothing; the risk(s) and  benefit(s) of the alternative treatment(s) or procedure(s); and the risk(s) and benefit(s) of doing nothing. The patient was provided information about the general risks and possible complications associated with the procedure. These may include, but are not limited to: failure to achieve desired goals, infection, bleeding, organ or nerve damage, allergic  reactions, paralysis, and death. In addition, the patient was informed of those risks and complications associated to the procedure, such as failure to decrease pain; infection; bleeding; organ or nerve damage with subsequent damage to sensory, motor, and/or autonomic systems, resulting in permanent pain, numbness, and/or weakness of one or several areas of the body; allergic reactions; (i.e.: anaphylactic reaction); and/or death. Furthermore, the patient was informed of those risks and complications associated with the medications. These include, but are not limited to: allergic reactions (i.e.: anaphylactic or anaphylactoid reaction(s)); adrenal axis suppression; blood sugar elevation that in diabetics may result in ketoacidosis or comma; water retention that in patients with history of congestive heart failure may result in shortness of breath, pulmonary edema, and decompensation with resultant heart failure; weight gain; swelling or edema; medication-induced neural toxicity; particulate matter embolism and blood vessel occlusion with resultant organ, and/or nervous system infarction; and/or aseptic necrosis of one or more joints. Finally, the patient was informed that Medicine is not an exact science; therefore, there is also the possibility of unforeseen or unpredictable risks and/or possible complications that may result in a catastrophic outcome. The patient indicated having understood very clearly. We have given the patient no guarantees and we have made no promises. Enough time was given to the patient to ask questions, all of which were answered to  the patient's satisfaction. Mr. Holston has indicated that he wanted to continue with the procedure. Attestation: I, the ordering provider, attest that I have discussed with the patient the benefits, risks, side-effects, alternatives, likelihood of achieving goals, and potential problems during recovery for the procedure that I have provided informed consent. Date  Time: 06/28/2022  3:08 PM  Pre-Procedure Preparation:  Monitoring: As per clinic protocol. Respiration, ETCO2, SpO2, BP, heart rate and rhythm monitor placed and checked for adequate function Safety Precautions: Patient was assessed for positional comfort and pressure points before starting the procedure. Time-out: I initiated and conducted the "Time-out" before starting the procedure, as per protocol. The patient was asked to participate by confirming the accuracy of the "Time Out" information. Verification of the correct person, site, and procedure were performed and confirmed by me, the nursing staff, and the patient. "Time-out" conducted as per Joint Commission's Universal Protocol (UP.01.01.01). Time: 1534  Description of Procedure:          Area Prepped: Entire             Region DuraPrep (Iodine Povacrylex [0.7% available iodine] and Isopropyl Alcohol, 74% w/w) Safety Precautions: Aspiration looking for blood return was conducted prior to all injections. At no point did we inject any substances, as a needle was being advanced. No attempts were made at seeking any paresthesias. Safe injection practices and needle disposal techniques used. Medications properly checked for expiration dates. SDV (single dose vial) medications used. Description of the Procedure: Protocol guidelines were followed. The patient was placed in position over the fluoroscopy table. The target area was identified and the area prepped in the usual manner. Skin & deeper tissues infiltrated with local anesthetic. Appropriate amount of time allowed to pass for local  anesthetics to take effect. The procedure needles were then advanced to the target area. Proper needle placement secured. Negative aspiration confirmed. Solution injected in intermittent fashion, asking for systemic symptoms every 0.5cc of injectate. The needles were then removed and the area cleansed, making sure to leave some of the prepping solution back to take advantage of its long term bactericidal properties.  Vitals:   06/28/22 1507 06/28/22  1539  BP: (!) 170/89 (!) 165/72  Pulse: 99 89  Resp: 18 18  Temp: (!) 97 F (36.1 C)   SpO2: 98% 98%  Weight: 185 lb (83.9 kg)   Height: 6' (1.829 m)     Start Time: 1535 hrs. End Time: 1536 hrs. Materials:  Needle(s) Type: Epidural needle Gauge: 20G Length: 3.5-in Medication(s): Please see orders for medications and dosing details.  Imaging Guidance:          Type of Imaging Technique: None used Indication(s): N/A Exposure Time: No patient exposure Contrast: None used. Fluoroscopic Guidance: N/A Ultrasound Guidance: N/A Interpretation: N/A  Antibiotic Prophylaxis:   Anti-infectives (From admission, onward)    None      Indication(s): None identified  Post-operative Assessment:  Post-procedure Vital Signs:  Pulse/HCG Rate: 89  Temp:  (!) 97 F (36.1 C) Resp: 18 BP:  (!) 165/72 SpO2: 98 %  EBL: None  Complications: No immediate post-treatment complications observed by team, or reported by patient.  Note: The patient tolerated the entire procedure well. A repeat set of vitals were taken after the procedure and the patient was kept under observation following institutional policy, for this type of procedure. Post-procedural neurological assessment was performed, showing return to baseline, prior to discharge. The patient was provided with post-procedure discharge instructions, including a section on how to identify potential problems. Should any problems arise concerning this procedure, the patient was given  instructions to immediately contact us, at any time, without hesitation. In any case, we plan to contact the patient by telephone for a follow-up status report regarding this interventional procedure.  Comments:  No additional relevant information.  Plan of Care  Orders:  Orders Placed This Encounter  Procedures   TRIGGER POINT INJECTION    Scheduling Instructions:     Area: Neck     Side: Midline     Sedation: No Sedation.     Timeframe: Today    Order Specific Question:   Where will this procedure be performed?    Answer:   [facility]   ToxASSURE Select 13 (MW), Urine    Volume: 30 ml(s). Minimum 3 ml of urine is needed. Document temperature of fresh sample. Indications: Long term (current) use of opiate analgesic [phone])    Order Specific Question:   Release to patient    Answer:   Immediate   Informed Consent Details: Physician/Practitioner Attestation; Transcribe to consent form and obtain patient signature    Provider Attestation: I, Woodson Dossie Arbour, MD, (Pain Management Specialist), the physician/practitioner, attest that I have discussed with the patient the benefits, risks, side effects, alternatives, likelihood of achieving goals and potential problems during recovery for the procedure that I have provided informed consent.    Scheduling Instructions:     Note: Always confirm laterality of pain with Mr. Escue, before procedure.     Transcribe to consent form and obtain patient signature.    Order Specific Question:   Physician/Practitioner attestation of informed consent for procedure/surgical case    Answer:   I, the physician/practitioner, attest that I have discussed with the patient the benefits, risks, side effects, alternatives, likelihood of achieving goals and potential problems during recovery for the procedure that I have provided informed consent.    Order Specific Question:   Procedure    Answer:   Myoneural Block (Trigger Point injection)     Order Specific Question:   Physician/Practitioner performing the procedure    Answer:   Adali Pennings A. Dossie Arbour MD  Order Specific Question:   Indication/Reason    Answer:   Musculoskeletal pain/myofascial pain secondary to trigger point   Provide equipment / supplies at bedside    "Block Tray" (Disposable  single use) Needle type: SpinalRegular Amount/quantity: 2 Size: Short(1.5-inch) Gauge: (25G x1) + (22G x1)    Standing Status:   Standing    Number of Occurrences:   1    Order Specific Question:   Specify    Answer:   Block Tray   Chronic Opioid Analgesic:  Tramadol 50 mg, 2 tabs PO q 6 hrs (400 mg/day of tramadol) (LAST UDS DONE 02/21/2020. Scanned wrong into system) MME/day: 40 mg/day.   Medications ordered for procedure: Meds ordered this encounter  Medications   traMADol (ULTRAM) 50 MG tablet    Sig: Take 2 tablets (100 mg total) by mouth every 6 (six) hours. Each refill must last 30 days.    Dispense:  240 tablet    Refill:  0    DO NOT: delete (not duplicate); no partial-fill (will deny script to complete), no refill request (F/U required). DISPENSE: 1 day early if closed on fill date. WARN: No CNS-depressants within 8 hrs of med.   pentafluoroprop-tetrafluoroeth (GEBAUERS) aerosol   lidocaine (XYLOCAINE) 2 % (with pres) injection 400 mg   ropivacaine (PF) 2 mg/mL (0.2%) (NAROPIN) injection 4 mL   methylPREDNISolone acetate (DEPO-MEDROL) injection 80 mg   Medications administered: We administered lidocaine, ropivacaine (PF) 2 mg/mL (0.2%), and methylPREDNISolone acetate.  See the medical record for exact dosing, route, and time of administration.  Follow-up plan:   Return in about 1 month (around 07/31/2022) for Eval-day (M,W), (F2F), (MM) +  (PPE).       Interventional Therapies  Risk  Complexity Considerations:   Estimated body mass index is 25.09 kg/m as calculated from the following:   Height as of this encounter: 6' (1.829 m).   Weight as of this encounter:  185 lb (83.9 kg). CKD HTN   Planned  Pending:   Therapeutic right cervical ESI #1 (12/15/2021)    Under consideration:   Diagnostic right IA hip joint injection  Diagnostic bilateral lumbar facet block  Possible bilateral lumbar facet RFA    Completed:   Diagnostic bilateral lumbar facet MBB x1 (06/30/2021)  Therapeutic left cervical ESI x2 (02/05/2021)  Therapeutic left L3-4 LESI x2 (06/13/2018)  Therapeutic left L3 TFESI x1 (03/13/2020)  Therapeutic left L4 TFESI x2 (03/13/2020)  Therapeutic right L3 TFESI x2 (03/13/2020)  Therapeutic right L4 TFESI x3 (03/13/2020)  Referral to neurosurgery Volanda Napoleon, MD) for cervical instability (11/11/2020)    Therapeutic  Palliative (PRN) options:   Palliative left L3-4 LESI #3  Palliative left L3 TFESI #2  Palliative left L4 TFESI #3  Palliative right L3 TFESI #3  Palliative right L4 TFESI #4      Recent Visits No visits were found meeting these conditions. Showing recent visits within past 90 days and meeting all other requirements Today's Visits Date Type Provider Dept  06/28/22 Office Visit Delano Metz, MD [facility]  Showing today's visits and meeting all other requirements Future Appointments Date Type Provider Dept  07/26/22 Appointment Delano Metz, MD [facility]  Showing future appointments within next 90 days and meeting all other requirements  Disposition: Discharge home  Discharge (Date  Time): 06/28/2022; 1540 hrs.   Primary Care Physician: Duffy, Barnabas Lister, MD Location: [facility] Note by: Oswaldo Done, MD Date: 06/28/2022; Time: 3:42 PM  Disclaimer:  Medicine is not an Visual merchandiserexact science. The only guarantee in medicine is that nothing is guaranteed. It is important to note that the decision to proceed with this intervention was based on the information collected from the patient. The Data and conclusions were drawn from the patient's  questionnaire, the interview, and the physical examination. Because the information was provided in large part by the patient, it cannot be guaranteed that it has not been purposely or unconsciously manipulated. Every effort has been made to obtain as much relevant data as possible for this evaluation. It is important to note that the conclusions that lead to this procedure are derived in large part from the available data. Always take into account that the treatment will also be dependent on availability of resources and existing treatment guidelines, considered by other Pain Management Practitioners as being common knowledge and practice, at the time of the intervention. For Medico-Legal purposes, it is also important to point out that variation in procedural techniques and pharmacological choices are the acceptable norm. The indications, contraindications, technique, and results of the above procedure should only be interpreted and judged by a Board-Certified Interventional Pain Specialist with extensive familiarity and expertise in the same exact procedure and technique.

## 2022-06-30 LAB — TOXASSURE SELECT 13 (MW), URINE

## 2022-07-06 ENCOUNTER — Other Ambulatory Visit
Admission: RE | Admit: 2022-07-06 | Discharge: 2022-07-06 | Disposition: A | Discharge status: Home / Self Care | Payer: Medicare HMO | Attending: Urology | Admitting: Urology

## 2022-07-06 ENCOUNTER — Encounter: Payer: Self-pay | Admitting: Urology

## 2022-07-06 ENCOUNTER — Ambulatory Visit (INDEPENDENT_AMBULATORY_CARE_PROVIDER_SITE_OTHER): Payer: Medicare HMO | Admitting: Urology

## 2022-07-06 ENCOUNTER — Other Ambulatory Visit: Payer: Self-pay | Admitting: *Deleted

## 2022-07-06 VITALS — BP 153/88 | HR 87 | Ht 72.0 in | Wt 185.0 lb

## 2022-07-06 DIAGNOSIS — R310 Gross hematuria: Secondary | ICD-10-CM

## 2022-07-06 LAB — URINALYSIS, COMPLETE (UACMP) WITH MICROSCOPIC
Bilirubin Urine: NEGATIVE
Glucose, UA: NEGATIVE mg/dL
Hgb urine dipstick: NEGATIVE
Ketones, ur: NEGATIVE mg/dL
Nitrite: NEGATIVE
Protein, ur: NEGATIVE mg/dL
RBC / HPF: NONE SEEN RBC/hpf (ref 0–5)
Specific Gravity, Urine: 1.015 (ref 1.005–1.030)
pH: 6.5 (ref 5.0–8.0)

## 2022-07-06 MED ORDER — CEPHALEXIN 250 MG PO CAPS
500.0000 mg | ORAL_CAPSULE | Freq: Once | ORAL | Status: AC
  Administered 2022-07-06: 500 mg via ORAL

## 2022-07-06 NOTE — Progress Notes (Signed)
Cystoscopy Procedure Note:  Indication: Gross hematuria, UTI  Keflex given for prophylaxis  After informed consent and discussion of the procedure and its risks, Caleb Edwards was positioned and prepped in the standard fashion. Cystoscopy was performed with a flexible cystoscope. The urethra, bladder neck and entire bladder was visualized in a standard fashion.  There was a subtle narrowing in the bulbar urethra that was able to be bypassed with the flexible cystoscope with gentle pressure.  The prostate was large with lateral lobe hypertrophy. The ureteral orifices were visualized in their normal location and orientation.  Mild bladder trabeculations, no suspicious lesions, intravesical protrusion of the prostate on retroflexion.  Imaging: CT 05/28/22 with bilateral nonobstructing stones, no hydronephrosis, no ureteral stones  Findings: Subtle urethral narrowing able to be bypassed with scope, enlarged prostate, normal bladder  Assessment and Plan: Follow-up cytology If cytology normal, RTC yearly for KUB for stone surveillance  Nickolas Madrid, MD 07/06/2022

## 2022-07-08 LAB — CYTOLOGY - NON PAP

## 2022-07-24 NOTE — Progress Notes (Unsigned)
PROVIDER NOTE: Information contained herein reflects review and annotations entered in association with encounter. Interpretation of such information and data should be left to medically-trained personnel. Information provided to patient can be located elsewhere in the medical record under "Patient Instructions". Document created using STT-dictation technology, any transcriptional errors that may result from process are unintentional.    Patient: Caleb Edwards  Service Category: E/M  Provider: Gaspar Cola, MD  DOB: 1951/06/04  DOS: 07/26/2022  Referring Provider: Cherylann Parr, MD  MRN: [id number]  Specialty: Interventional Pain Management  PCP: Cherylann Parr, MD  Type: Established Patient  Setting: Ambulatory outpatient    Location: Office  Delivery: Face-to-face     HPI  [name], a 71 y.o. year old male, is here today because of his No primary diagnosis found.. [name] primary complain today is No chief complaint on file. Last encounter: My last encounter with him was on 06/28/2022. Pertinent problems: 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; DDD (degenerative disc disease), lumbosacral; Lumbar facet arthropathy; Chronic hip pain (2ry area of Pain) (Bilateral) (R>L); Cervical radiculopathy (Left); Cervicalgia; DDD (degenerative disc disease), cervical; Abnormal x-ray of cervical spine; 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 Pain Assessment: Severity of   is reported as a  /10. Location:    / . Onset:  . Quality:  . Timing:  . Modifying factor(s):  .Marland KitchenVitals:  vitals were not taken for this visit.   Reason for encounter: medication management. ***  01/27/2023  Pharmacotherapy Assessment  Analgesic: Tramadol 50 mg, 2 tabs PO q 6 hrs (400 mg/day of tramadol) (LAST UDS DONE 02/21/2020. Scanned wrong into system) MME/day: 40 mg/day.   Monitoring: [place] PMP: PDMP reviewed during this encounter.       Pharmacotherapy: No side-effects or adverse reactions reported. Compliance: No problems identified. Effectiveness: Clinically acceptable.  No notes on file  No results found for: "CBDTHCR" No results found for: "D8THCCBX" No results found for: "D9THCCBX"  UDS:  Summary  Date Value Ref Range Status  06/28/2022 Note  Final    Comment:    ==================================================================== [employer] [facility] 13 (MW) ==================================================================== Test                             Result       Flag       Units  Drug Present and Declared for Prescription Verification   Tramadol                       >11111       EXPECTED   ng/mg creat   O-Desmethyltramadol            >11111       EXPECTED   ng/mg creat  N-Desmethyltramadol            2920         EXPECTED   ng/mg creat    Source of tramadol is a prescription medication. O-desmethyltramadol    and N-desmethyltramadol are expected metabolites of tramadol.  ==================================================================== Test                      Result    Flag    Units      Ref Range   Creatinine              45               mg/dL      >=20 ==================================================================== Declared Medications:  The flagging and interpretation on this report are based on the  following declared medications.  Unexpected results may arise from  inaccuracies in the declared medications.   **Note: The testing scope of this panel includes these medications:   Tramadol (Ultram)   **Note: The testing scope of this panel does not include the  following reported medications:   Amlodipine (Norvasc)  Chlorthalidone (Hygroton)  Ibuprofen (Advil)  Iron  Lisinopril (Zestril)  Pantoprazole (Protonix)  Rosuvastatin (Crestor)  Sildenafil (Viagra)  Trazodone (Desyrel) ==================================================================== For clinical consultation, please call [phone]. ====================================================================       ROS  Constitutional: Denies any fever or chills Gastrointestinal: No reported hemesis, hematochezia, vomiting, or acute GI distress Musculoskeletal: Denies any acute onset joint swelling, redness, loss of ROM, or weakness Neurological: No reported episodes of acute onset apraxia, aphasia, dysarthria, agnosia, amnesia, paralysis, loss of coordination, or loss of consciousness  Medication Review  amLODipine, chlorthalidone, ferrous sulfate, ibuprofen, lisinopril, pantoprazole, rosuvastatin, sildenafil, traMADol, and traZODone  History Review  Allergy: [name] has No Known Allergies. Drug: [name]  reports no history of drug use. Alcohol:  reports current alcohol use of about 1.0 standard drink of alcohol per week. Tobacco:  reports that he has quit smoking. He has been exposed to tobacco smoke. He has never used smokeless tobacco. Social: [name]  reports that he has quit smoking. He has been exposed to tobacco smoke. He has never used smokeless tobacco. He  reports current alcohol use of about 1.0 standard drink of alcohol per week. He reports that he does not use drugs. Medical:  has a past medical history of Back pain, Blood transfusion without reported diagnosis (12/2015), Diverticulitis, Hypertension, Kidney stone, and Ulcer, stomach peptic, chronic. Surgical: [name]  has a past surgical history that includes Esophagogastroduodenoscopy (egd) with propofol (N/A, 01/03/2016); Colonoscopy with propofol (N/A, 08/02/2017); Esophagogastroduodenoscopy (egd) with propofol (08/02/2017); Esophagogastroduodenoscopy (egd) with propofol (N/A, 02/05/2018); Colonoscopy with propofol (N/A, 02/05/2018); Esophagogastroduodenoscopy (egd) with propofol (N/A, 03/30/2018); Esophagogastroduodenoscopy (egd) with propofol (N/A, 08/09/2019); Colonoscopy with propofol (N/A, 08/09/2019); Givens capsule study (N/A, 04/24/2020); and Esophagogastroduodenoscopy (egd) with propofol (N/A, 04/24/2020). Family: family history includes Diabetes in his mother; Heart disease in his father; Prostate cancer in his brother.  Laboratory Chemistry Profile   Renal Lab Results  Component Value Date   BUN 18 02/21/2020   CREATININE 1.24 02/21/2020   GFRAA >60 02/21/2020   GFRNONAA 60 (L) 02/21/2020    Hepatic Lab Results  Component Value Date   AST 23 08/09/2019   ALT 15 08/09/2019   ALBUMIN 3.8 08/09/2019   ALKPHOS 53 08/09/2019   LIPASE 17 08/08/2019    Electrolytes Lab Results  Component Value Date   NA 138 08/09/2019   K 3.9 08/09/2019  CL 106 08/09/2019   CALCIUM 8.7 (L) 08/09/2019   MG 1.9 01/04/2016    Bone Lab Results  Component Value Date   VD25OH 24.0 (L) 12/26/2015    Inflammation (CRP: Acute Phase) (ESR: Chronic Phase) Lab Results  Component Value Date   CRP 0.6 12/26/2015   ESRSEDRATE 3 12/26/2015         Note: Above Lab results reviewed.  Recent Imaging Review  CT ABDOMEN PELVIS WO CONTRAST CLINICAL DATA:  71 year old male with dysuria, hematuria  and bilateral flank pain.  EXAM: CT ABDOMEN AND PELVIS WITHOUT CONTRAST  TECHNIQUE: Multidetector CT imaging of the abdomen and pelvis was performed following the standard protocol without IV contrast.  RADIATION DOSE REDUCTION: This exam was performed according to the departmental dose-optimization program which includes automated exposure control, adjustment of the mA and/or kV according to patient size and/or use of iterative reconstruction technique.  COMPARISON:  03/19/2020 and prior CTs  FINDINGS: Please note that parenchymal and vascular abnormalities may be missed as intravenous contrast was not administered.  Lower chest: No acute abnormality. Subcentimeter basilar nodules are unchanged from 2017 and considered benign.  Hepatobiliary: The liver and gallbladder are unremarkable. There is no evidence of intrahepatic or extrahepatic biliary dilatation.  Pancreas: Unremarkable  Spleen: Unremarkable  Adrenals/Urinary Tract: Bilateral nephrolithiasis again noted with an 8 mm RIGHT LOWER pole calculus, 6 mm LEFT LOWER pole calculus and a 7 mm mid LEFT renal calculus. There is no evidence of hydronephrosis, obstructing urinary calculus or suspicious renal mass.  Increased density within the bladder may represent complicated fluid/blood, given this patient's history of hematuria. No other definite bladder abnormalities identified on this noncontrast study.  The adrenal glands are unremarkable.  Stomach/Bowel: Stomach is within normal limits. Appendix appears normal. No evidence of bowel wall thickening, distention, or inflammatory changes. Colonic diverticulosis identified without evidence of acute diverticulitis.  Vascular/Lymphatic: Aortic atherosclerosis. No enlarged abdominal or pelvic lymph nodes.  Reproductive: Mild prostate enlargement again noted.  Other: Insert no ascites  Musculoskeletal: No acute or suspicious bony abnormalities are noted. Grade 1  anterolisthesis of L5 on S1 and multilevel degenerative disc disease/spondylosis and facet arthropathy again noted.  IMPRESSION: 1. Increased density within the bladder, which may represent complicated fluid/blood, given this patient's history of hematuria. No other gross bladder abnormality identified. Clinical follow-up recommended. 2. Bilateral nephrolithiasis. No evidence of hydronephrosis or obstructing urinary calculi. 3. Aortic Atherosclerosis (ICD10-I70.0).  Electronically Signed   By: Margarette Canada M.D.   On: 05/30/2022 09:54 Note: Reviewed        Physical Exam  General appearance: Well nourished, well developed, and well hydrated. In no apparent acute distress Mental status: Alert, oriented x 3 (person, place, & time)       Respiratory: No evidence of acute respiratory distress Eyes: PERLA Vitals: There were no vitals taken for this visit. BMI: Estimated body mass index is 25.09 kg/m as calculated from the following:   Height as of 07/06/22: 6' (1.829 m).   Weight as of 07/06/22: 185 lb (83.9 kg). Ideal: Patient weight not recorded  Assessment   Diagnosis Status  1. Chronic pain syndrome   2. Chronic low back pain (1ry area of Pain) (Bilateral) (R>L) w/o sciatica   3. Chronic hip pain (2ry area of Pain) (Bilateral) (R>L)   4. Chronic lower extremity pain (3ry area of Pain) (Bilateral) (R>L)   5. 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 Chronic neck pain   7. Diffuse myofascial pain  syndrome   8. Lumbar facet syndrome (Bilateral) (R>L)   9. Pharmacologic therapy   10. Uncomplicated opioid dependence ([place])   11. Encounter for medication management   12. Encounter for chronic pain management    Controlled Controlled Controlled   Updated Problems: No problems updated.   Plan of Care  Problem-specific:  No problem-specific Assessment & Plan notes found for this encounter.  Caleb Edwards has a current medication list which includes the  following long-term medication(s): chlorthalidone, lisinopril, pantoprazole, rosuvastatin, tramadol, and trazodone.  Pharmacotherapy (Medications Ordered): No orders of the defined types were placed in this encounter.  Orders:  No orders of the defined types were placed in this encounter.  Follow-up plan:   No follow-ups on file.     Interventional Therapies  Risk  Complexity Considerations:   Estimated body mass index is 25.09 kg/m as calculated from the following:   Height as of this encounter: 6' (1.829 m).   Weight as of this encounter: 185 lb (83.9 kg). CKD HTN   Planned  Pending:   Therapeutic right cervical ESI #1 (12/15/2021)    Under consideration:   Diagnostic right IA hip joint injection  Diagnostic bilateral lumbar facet block  Possible bilateral lumbar facet RFA    Completed:   Diagnostic bilateral lumbar facet MBB x1 (06/30/2021)  Therapeutic left cervical ESI x2 (02/05/2021)  Therapeutic left L3-4 LESI x2 (06/13/2018)  Therapeutic left L3 TFESI x1 (03/13/2020)  Therapeutic left L4 TFESI x2 (03/13/2020)  Therapeutic right L3 TFESI x2 (03/13/2020)  Therapeutic right L4 TFESI x3 (03/13/2020)  Referral to neurosurgery Elayne Guerin, MD) for cervical instability (11/11/2020)    Therapeutic  Palliative (PRN) options:   Palliative left L3-4 LESI #3  Palliative left L3 TFESI #2  Palliative left L4 TFESI #3  Palliative right L3 TFESI #3  Palliative right L4 TFESI #4       Recent Visits Date Type Provider Dept  06/28/22 Office Visit Milinda Pointer, MD [facility]  Showing recent visits within past 90 days and meeting all other requirements Future Appointments Date Type Provider Dept  07/26/22 Appointment Milinda Pointer, MD [facility]  Showing future appointments within next 90 days and meeting all other requirements  I discussed the assessment and treatment plan with the patient. The patient was provided an opportunity to ask  questions and all were answered. The patient agreed with the plan and demonstrated an understanding of the instructions.  Patient advised to call back or seek an in-person evaluation if the symptoms or condition worsens.  Duration of encounter: *** minutes.  Total time on encounter, as per AMA guidelines included both the face-to-face and non-face-to-face time personally spent by the physician and/or other qualified health care professional(s) on the day of the encounter (includes time in activities that require the physician or other qualified health care professional and does not include time in activities normally performed by clinical staff). Physician's time may include the following activities when performed: preparing to see the patient (eg, review of tests, pre-charting review of records) obtaining and/or reviewing separately obtained history performing a medically appropriate examination and/or evaluation counseling and educating the patient/family/caregiver ordering medications, tests, or procedures referring and communicating with other health care professionals (when not separately reported) documenting clinical information in the electronic or other health record independently interpreting results (not separately reported) and communicating results to the patient/ family/caregiver care coordination (not separately reported)  Note by: Gaspar Cola, MD Date: 07/26/2022; Time: 5:51 PM

## 2022-07-26 ENCOUNTER — Encounter: Payer: Self-pay | Admitting: Pain Medicine

## 2022-07-26 ENCOUNTER — Ambulatory Visit: Payer: Medicare HMO | Attending: Pain Medicine | Admitting: Pain Medicine

## 2022-07-26 DIAGNOSIS — M7918 Myalgia, other site: Secondary | ICD-10-CM | POA: Diagnosis present

## 2022-07-26 DIAGNOSIS — M25552 Pain in left hip: Secondary | ICD-10-CM | POA: Diagnosis present

## 2022-07-26 DIAGNOSIS — G8929 Other chronic pain: Secondary | ICD-10-CM | POA: Diagnosis present

## 2022-07-26 DIAGNOSIS — M79604 Pain in right leg: Secondary | ICD-10-CM | POA: Diagnosis present

## 2022-07-26 DIAGNOSIS — G894 Chronic pain syndrome: Secondary | ICD-10-CM | POA: Insufficient documentation

## 2022-07-26 DIAGNOSIS — M542 Cervicalgia: Secondary | ICD-10-CM | POA: Insufficient documentation

## 2022-07-26 DIAGNOSIS — M47816 Spondylosis without myelopathy or radiculopathy, lumbar region: Secondary | ICD-10-CM | POA: Insufficient documentation

## 2022-07-26 DIAGNOSIS — M5416 Radiculopathy, lumbar region: Secondary | ICD-10-CM | POA: Insufficient documentation

## 2022-07-26 DIAGNOSIS — Z79899 Other long term (current) drug therapy: Secondary | ICD-10-CM | POA: Insufficient documentation

## 2022-07-26 DIAGNOSIS — F1120 Opioid dependence, uncomplicated: Secondary | ICD-10-CM | POA: Diagnosis present

## 2022-07-26 DIAGNOSIS — M25551 Pain in right hip: Secondary | ICD-10-CM | POA: Insufficient documentation

## 2022-07-26 DIAGNOSIS — M79605 Pain in left leg: Secondary | ICD-10-CM | POA: Diagnosis present

## 2022-07-26 DIAGNOSIS — M545 Low back pain, unspecified: Secondary | ICD-10-CM | POA: Diagnosis present

## 2022-07-26 MED ORDER — TRAMADOL HCL 50 MG PO TABS: 100.0000 mg | ORAL_TABLET | Freq: Four times a day (QID) | ORAL | 5 refills | Status: DC

## 2022-07-26 MED ORDER — NALOXONE HCL 4 MG/0.1ML NA LIQD: 1.0000 | NASAL | 0 refills | Status: DC | PRN

## 2022-07-26 NOTE — Patient Instructions (Signed)
Naloxone Nasal Spray What is this medication? NALOXONE (nal OX one) treats opioid overdose, which causes slow or shallow breathing, severe drowsiness, or trouble staying awake. Call emergency services after using this medication. You may need additional treatment. Naloxone works by reversing the effects of opioids. It belongs to a group of medications called opioid blockers. This medicine may be used for other purposes; ask your health care provider or pharmacist if you have questions. COMMON BRAND NAME(S): Kloxxado, Narcan What should I tell my care team before I take this medication? They need to know if you have any of these conditions: Heart disease Substance use disorder An unusual or allergic reaction to naloxone, other medications, foods, dyes, or preservatives Pregnant or trying to get pregnant Breast-feeding How should I use this medication? This medication is for use in the nose. Lay the person on their back. Support their neck with your hand and allow the head to tilt back before giving the medication. The nasal spray should be given into 1 nostril. After giving the medication, move the person onto their side. Do not remove or test the nasal spray until ready to use. Get emergency medical help right away after giving the first dose of this medication, even if the person wakes up. You should be familiar with how to recognize the signs and symptoms of a narcotic overdose. If more doses are needed, give the additional dose in the other nostril. Talk to your care team about the use of this medication in children. While this medication may be prescribed for children as young as newborns for selected conditions, precautions do apply. Overdosage: If you think you have taken too much of this medicine contact a poison control center or emergency room at once. NOTE: This medicine is only for you. Do not share this medicine with others. What if I miss a dose? This does not apply. What may  interact with this medication? This is only used during an emergency. No interactions are expected during emergency use. This list may not describe all possible interactions. Give your health care provider a list of all the medicines, herbs, non-prescription drugs, or dietary supplements you use. Also tell them if you smoke, drink alcohol, or use illegal drugs. Some items may interact with your medicine. What should I watch for while using this medication? Keep this medication ready for use in the case of an opioid overdose. Make sure that you have the phone number of your care team and local hospital ready. You may need to have additional doses of this medication. Each nasal spray contains a single dose. Some emergencies may require additional doses. After use, bring the treated person to the nearest hospital or call 911. Make sure the treating care team knows that the person has received a dose of this medication. You will receive additional instructions on what to do during and after use of this medication before an emergency occurs. What side effects may I notice from receiving this medication? Side effects that you should report to your care team as soon as possible: Allergic reactions--skin rash, itching, hives, swelling of the face, lips, tongue, or throat Side effects that usually do not require medical attention (report these to your care team if they continue or are bothersome): Constipation Dryness or irritation inside the nose Headache Increase in blood pressure Muscle spasms Stuffy nose Toothache This list may not describe all possible side effects. Call your doctor for medical advice about side effects. You may report side effects to FDA   at 1-800-FDA-1088. Where should I keep my medication? Keep out of the reach of children and pets. Store between 20 and 25 degrees C (68 and 77 degrees F). Do not freeze. Throw away any unused medication after the expiration date. Keep in original  box until ready to use. NOTE: This sheet is a summary. It may not cover all possible information. If you have questions about this medicine, talk to your doctor, pharmacist, or health care provider.  2023 Elsevier/Gold Standard (2021-08-17 00:00:00) ____________________________________________________________________________________________  Medication Rules  Purpose: To inform patients, and their family members, of our rules and regulations.  Applies to: All patients receiving prescriptions (written or electronic).  Pharmacy of record: Pharmacy where electronic prescriptions will be sent. If written prescriptions are taken to a different pharmacy, please inform the nursing staff. The pharmacy listed in the electronic medical record should be the one where you would like electronic prescriptions to be sent.  Electronic prescriptions: In compliance with the [place] Strengthen Opioid Misuse Prevention (STOP) Act of 2017 (Session Law 2017-74/H243), effective October 04, 2018, all controlled substances must be electronically prescribed. Calling prescriptions to the pharmacy will cease to exist.  Prescription refills: Only during scheduled appointments. Applies to all prescriptions.  NOTE: The following applies primarily to controlled substances (Opioid* Pain Medications).   Type of encounter (visit): For patients receiving controlled substances, face-to-face visits are required. (Not an option or up to the patient.)  Patient's responsibilities: Pain Pills: Bring all pain pills to every appointment (except for procedure appointments). Pill Bottles: Bring pills in original pharmacy bottle. Always bring the newest bottle. Bring bottle, even if empty. Medication refills: You are responsible for knowing and keeping track of what medications you take and those you need refilled. The day before your appointment: write a list of all prescriptions that need to be refilled. The day of the  appointment: give the list to the admitting nurse. Prescriptions will be written only during appointments. No prescriptions will be written on procedure days. If you forget a medication: it will not be "Called in", "Faxed", or "electronically sent". You will need to get another appointment to get these prescribed. No early refills. Do not call asking to have your prescription filled early. Prescription Accuracy: You are responsible for carefully inspecting your prescriptions before leaving our office. Have the discharge nurse carefully go over each prescription with you, before taking them home. Make sure that your name is accurately spelled, that your address is correct. Check the name and dose of your medication to make sure it is accurate. Check the number of pills, and the written instructions to make sure they are clear and accurate. Make sure that you are given enough medication to last until your next medication refill appointment. Taking Medication: Take medication as prescribed. When it comes to controlled substances, taking less pills or less frequently than prescribed is permitted and encouraged. Never take more pills than instructed. Never take medication more frequently than prescribed.  Inform other Doctors: Always inform, all of your healthcare providers, of all the medications you take. Pain Medication from other Providers: You are not allowed to accept any additional pain medication from any other Doctor or Healthcare provider. There are two exceptions to this rule. (see below) In the event that you require additional pain medication, you are responsible for notifying us, as stated below. Cough Medicine: Often these contain an opioid, such as codeine or hydrocodone. Never accept or take cough medicine containing these opioids if you are already taking   an opioid* medication. The combination may cause respiratory failure and death. Medication Agreement: You are responsible for carefully  reading and following our Medication Agreement. This must be signed before receiving any prescriptions from our practice. Safely store a copy of your signed Agreement. Violations to the Agreement will result in no further prescriptions. (Additional copies of our Medication Agreement are available upon request.) Laws, Rules, & Regulations: All patients are expected to follow all Federal and State Laws, Statutes, Rules, & Regulations. Ignorance of the Laws does not constitute a valid excuse.  Illegal drugs and Controlled Substances: The use of illegal substances (including, but not limited to marijuana and its derivatives) and/or the illegal use of any controlled substances is strictly prohibited. Violation of this rule may result in the immediate and permanent discontinuation of any and all prescriptions being written by our practice. The use of any illegal substances is prohibited. Adopted CDC guidelines & recommendations: Target dosing levels will be at or below 60 MME/day. Use of benzodiazepines** is not recommended.  Exceptions: There are only two exceptions to the rule of not receiving pain medications from other Healthcare Providers. Exception #1 (Emergencies): In the event of an emergency (i.e.: accident requiring emergency care), you are allowed to receive additional pain medication. However, you are responsible for: As soon as you are able, call our office [phone], at any time of the day or night, and leave a message stating your name, the date and nature of the emergency, and the name and dose of the medication prescribed. In the event that your call is answered by a member of our staff, make sure to document and save the date, time, and the name of the person that took your information.  Exception #2 (Planned Surgery): In the event that you are scheduled by another doctor or dentist to have any type of surgery or procedure, you are allowed (for a period no longer than 30 days), to receive  additional pain medication, for the acute post-op pain. However, in this case, you are responsible for picking up a copy of our "Post-op Pain Management for Surgeons" handout, and giving it to your surgeon or dentist. This document is available at our office, and does not require an appointment to obtain it. Simply go to our office during business hours (Monday-Thursday from 8:00 AM to 4:00 PM) (Friday 8:00 AM to 12:00 Noon) or if you have a scheduled appointment with us, prior to your surgery, and ask for it by name. In addition, you are responsible for: calling our office [phone], at any time of the day or night, and leaving a message stating your name, name of your surgeon, type of surgery, and date of procedure or surgery. Failure to comply with your responsibilities may result in termination of therapy involving the controlled substances. Medication Agreement Violation. Following the above rules, including your responsibilities will help you in avoiding a Medication Agreement Violation ("Breaking your Pain Medication Contract").  *Opioid medications include: morphine, codeine, oxycodone, oxymorphone, hydrocodone, hydromorphone, meperidine, tramadol, tapentadol, buprenorphine, fentanyl, methadone. **Benzodiazepine medications include: diazepam (Valium), alprazolam (Xanax), clonazepam (Klonopine), lorazepam (Ativan), clorazepate (Tranxene), chlordiazepoxide (Librium), estazolam (Prosom), oxazepam (Serax), temazepam (Restoril), triazolam (Halcion) (Last updated: 07/01/2021) ____________________________________________________________________________________________  ____________________________________________________________________________________________  Medication Recommendations and Reminders  Applies to: All patients receiving prescriptions (written and/or electronic).  Medication Rules & Regulations: These rules and regulations exist for your safety and that of others. They are not  flexible and neither are we. Dismissing or ignoring them will be considered "non-compliance"   with medication therapy, resulting in complete and irreversible termination of such therapy. (See document titled "Medication Rules" for more details.) In all conscience, because of safety reasons, we cannot continue providing a therapy where the patient does not follow instructions.  Pharmacy of record:  Definition: This is the pharmacy where your electronic prescriptions will be sent.  We do not endorse any particular pharmacy, however, we have experienced problems with [facility] not securing enough medication supply for the community. We do not restrict you in your choice of pharmacy. However, once we write for your prescriptions, we will NOT be re-sending more prescriptions to fix restricted supply problems created by your pharmacy, or your insurance.  The pharmacy listed in the electronic medical record should be the one where you want electronic prescriptions to be sent. If you choose to change pharmacy, simply notify our nursing staff.  Recommendations: Keep all of your pain medications in a safe place, under lock and key, even if you live alone. We will NOT replace lost, stolen, or damaged medication. After you fill your prescription, take 1 week's worth of pills and put them away in a safe place. You should keep a separate, properly labeled bottle for this purpose. The remainder should be kept in the original bottle. Use this as your primary supply, until it runs out. Once it's gone, then you know that you have 1 week's worth of medicine, and it is time to come in for a prescription refill. If you do this correctly, it is unlikely that you will ever run out of medicine. To make sure that the above recommendation works, it is very important that you make sure your medication refill appointments are scheduled at least 1 week before you run out of medicine. To do this in an effective manner, make sure that  you do not leave the office without scheduling your next medication management appointment. Always ask the nursing staff to show you in your prescription , when your medication will be running out. Then arrange for the receptionist to get you a return appointment, at least 7 days before you run out of medicine. Do not wait until you have 1 or 2 pills left, to come in. This is very poor planning and does not take into consideration that we may need to cancel appointments due to bad weather, sickness, or emergencies affecting our staff. DO NOT ACCEPT A "Partial Fill": If for any reason your pharmacy does not have enough pills/tablets to completely fill or refill your prescription, do not allow for a "partial fill". The law allows the pharmacy to complete that prescription within 72 hours, without requiring a new prescription. If they do not fill the rest of your prescription within those 72 hours, you will need a separate prescription to fill the remaining amount, which we will NOT provide. If the reason for the partial fill is your insurance, you will need to talk to the pharmacist about payment alternatives for the remaining tablets, but again, DO NOT ACCEPT A PARTIAL FILL, unless you can trust your pharmacist to obtain the remainder of the pills within 72 hours.  Prescription refills and/or changes in medication(s):  Prescription refills, and/or changes in dose or medication, will be conducted only during scheduled medication management appointments. (Applies to both, written and electronic prescriptions.) No refills on procedure days. No medication will be changed or started on procedure days. No changes, adjustments, and/or refills will be conducted on a procedure day. Doing so will interfere with the diagnostic portion   of the procedure. No phone refills. No medications will be "called into the pharmacy". No Fax refills. No weekend refills. No Holliday refills. No after hours refills.  Remember:   Business hours are:  Monday to Thursday 8:00 AM to 4:00 PM Provider's Schedule: Bronx Brogden, MD - Appointments are:  Medication management: Monday and Wednesday 8:00 AM to 4:00 PM Procedure day: Tuesday and Thursday 7:30 AM to 4:00 PM Bilal Lateef, MD - Appointments are:  Medication management: Tuesday and Thursday 8:00 AM to 4:00 PM Procedure day: Monday and Wednesday 7:30 AM to 4:00 PM (Last update: 04/23/2020) ____________________________________________________________________________________________  ____________________________________________________________________________________________  Pharmacy Shortages of Pain Medication   Introduction Shockingly as it may seem, .  "No U.[place] Supreme Court decision has ever interpreted the Constitution as guaranteeing a right to health care for all Americans." - https://www.healthequityandpolicylab.com/elusive-right-to-health-care-under-us-law  "With respect to human rights, the [place] has no formally codified right to health, nor does it participate in a human rights treaty that specifies a right to health." - Scott J. Schweikart, JD, MBE  Situation By now, most of our patients have had the experience of being told by their pharmacist that they do not have enough medication to cover their prescription. If you have not had this experience, just know that you soon will.  Problem There appears to be a shortage of these medications, either at the national level or locally. This is happening with all pharmacies. When there is not enough medication, patients are offered a partial fill and they are told that they will try to get the rest of the medicine for them at a later time. If they do not have enough for even a partial fill, the pharmacists are telling the patients to call us (the prescribing physicians) to request that we send another prescription to another pharmacy to get the medicine.   This reordering of a controlled  substance creates documentation problems where additional paperwork needs to be created to explain why two prescriptions for the same period of time and the same medicine are being prescribed to the same patient. It also creates situations where the last appointment note does not accurately reflect when and what prescriptions were given to a patient. This leads to prescribing errors down the line, in subsequent follow-up visits.   [place] Board of Pharmacy ([place]BOP) Research revealed that Board of Pharmacy Rule .1806 (21 NCAC 46.1806) authorizes pharmacists to the transfer of prescriptions among pharmacies, and it sets forth procedural and recordkeeping requirements for doing so. However, this requires the pharmacist to complete the previously mentioned procedural paperwork to accomplish the transfer. As it turns out, it is much easier for them to have the prescribing physicians do the work.   Possible solutions 1. You can ask your physician to assist you in weaning yourself off these medications. 2. Ask your pharmacy if the medication is in stock, 3 days prior to your refill. 3. If you need a pharmacy change, let us know at your medication management visit. Prescriptions that have already been electronically sent to a pharmacy will not be re-sent to a different pharmacy if your pharmacy of record does not have it in stock. Proper stocking of medication is a pharmacy problem, not a prescriber problem. Work with your pharmacist to solve the problem. 4. Have the [place] Assembly add a provision to the "STOP ACT" (the law that mandates how controlled substances are prescribed) where there is an exception to the electronic prescribing rule that states that in   the event there are shortages of medications the physicians are allowed to use written prescriptions as opposed to electronic ones. This would allow patients to take their prescriptions to a different pharmacy that may have enough  medication available to fill the prescription. The problem is that currently there is a law that does not allow for written prescriptions, with the exception of instances where the electronic medical record is down due to technical issues.  5. Have [place] Congress ease the pressure on pharmaceutical companies, allowing them to produce enough quantities of the medication to adequately supply the population. 6. Have pharmacies keep enough stocks of these medications to cover their client base.  7. Have the [place] State Assembly add a provision to the "STOP ACT" where they ease the regulations surrounding the transfer of controlled substances between pharmacies, so as to simplify the transfer of supplies. As an alternative, develop a system to allow patients to obtain the remainder of their prescription at another one of their pharmacies or at an associate pharmacy.   How this shortage will affect you.  Understand that this is a pharmacy supply problem, not a prescriber problem. Work with your pharmacy to solve it. The job of the prescriber is to evaluate and monitor the patient for the appropriate indications and use of these medicines. It is not the job of the prescriber to supply the medication or to solve problems with that supply. The responsibility and the choice to obtain the medication resides on the patient. By law, supplying the medication is the job of the pharmacy. It is certainly not the job of the prescriber to solve supply problems.   Due to the above problems we are no longer taking patients to write for their pain medication. Future discussions with your physician may include potentially weaning medications or transitioning to alternatives.  We will be focusing primarily on interventional based pain management. We will continue to evaluate for appropriate indications and we may provide recommendations regarding medication, dose, and schedule, as well as monitoring recommendations, however,  we will not be taking over the actual prescribing of these substances. On those patients where we are treating their chronic pain with interventional therapies, exceptions will be considered on a case by case basis. At this time, we will try to continue providing this supplemental service to those patients we have been managing in the past. However, as of August 1st, 2023, we no longer will be sending additional prescriptions to other pharmacies for the purpose of solving their supply problems. Once we send a prescription to a pharmacy, we will not be resending it again to another pharmacy to cover for their shortages.   What to do. Write as many letters as you can. Recruit the help of family members in writing these letters. Below are some of the places where you can write to make your voice heard. Let them know what the problem is and push them to look for solutions.   Search internet for: "[place] find your legislators" https://www.ncleg.gov/findyourlegislators  Search internet for: "[place] insurance commissioner complaints" https://www.ncdoi.gov/contactscomplaints/assistance-or-file-complaint  Search internet for: "[place] Board of Pharmacy complaints" http://www.ncbop.org/contact.htm  Search internet for: "[facility] pharmacy complaints" Email [facility] Customer Relations https://www.[facility].com/help/email-[email or web address].jsp?callType=store  Search internet for: "[facility] pharmacy customer service complaints" https://www.[facility].com/topic/marketing/contactus/contactus_customerservice.jsp  ____________________________________________________________________________________________  ____________________________________________________________________________________________  Drug Holidays (Slow)  What is a "Drug Holiday"? Drug Holiday: is the name given to the period of time during which a patient stops taking a medication(s) for the purpose of eliminating   tolerance  to the drug.  Benefits Improved effectiveness of opioids. Decreased opioid dose needed to achieve benefits. Improved pain with lesser dose.  What is tolerance? Tolerance: is the progressive decreased in effectiveness of a drug due to its repetitive use. With repetitive use, the body gets use to the medication and as a consequence, it loses its effectiveness. This is a common problem seen with opioid pain medications. As a result, a larger dose of the drug is needed to achieve the same effect that used to be obtained with a smaller dose.  How long should a "Drug Holiday" last? You should stay off of the pain medicine for at least 14 consecutive days. (2 weeks)  Should I stop the medicine "cold turkey"? No. You should always coordinate with your Pain Specialist so that he/she can provide you with the correct medication dose to make the transition as smoothly as possible.  How do I stop the medicine? Slowly. You will be instructed to decrease the daily amount of pills that you take by one (1) pill every seven (7) days. This is called a "slow downward taper" of your dose. For example: if you normally take four (4) pills per day, you will be asked to drop this dose to three (3) pills per day for seven (7) days, then to two (2) pills per day for seven (7) days, then to one (1) per day for seven (7) days, and at the end of those last seven (7) days, this is when the "Drug Holiday" would start.   Will I have withdrawals? By doing a "slow downward taper" like this one, it is unlikely that you will experience any significant withdrawal symptoms. Typically, what triggers withdrawals is the sudden stop of a high dose opioid therapy. Withdrawals can usually be avoided by slowly decreasing the dose over a prolonged period of time. If you do not follow these instructions and decide to stop your medication abruptly, withdrawals may be possible.  What are withdrawals? Withdrawals: refers to the wide range of  symptoms that occur after stopping or dramatically reducing opiate drugs after heavy and prolonged use. Withdrawal symptoms do not occur to patients that use low dose opioids, or those who take the medication sporadically. Contrary to benzodiazepine (example: Valium, Xanax, etc.) or alcohol withdrawals ("Delirium Tremens"), opioid withdrawals are not lethal. Withdrawals are the physical manifestation of the body getting rid of the excess receptors.  Expected Symptoms Early symptoms of withdrawal may include: Agitation Anxiety Muscle aches Increased tearing Insomnia Runny nose Sweating Yawning  Late symptoms of withdrawal may include: Abdominal cramping Diarrhea Dilated pupils Goose bumps Nausea Vomiting  Will I experience withdrawals? Due to the slow nature of the taper, it is very unlikely that you will experience any.  What is a slow taper? Taper: refers to the gradual decrease in dose.  (Last update: 04/23/2020) ____________________________________________________________________________________________   ____________________________________________________________________________________________  CBD (cannabidiol) & Delta-8 (Delta-8 tetrahydrocannabinol) WARNING  Intro: Cannabidiol (CBD) and tetrahydrocannabinol (THC), are two natural compounds found in plants of the Cannabis genus. They can both be extracted from hemp or cannabis. Hemp and cannabis come from the Cannabis sativa plant. Both compounds interact with your body's endocannabinoid system, but they have very different effects. CBD does not produce the high sensation associated with cannabis. Delta-8 tetrahydrocannabinol, also known as delta-8 THC, is a psychoactive substance found in the Cannabis sativa plant, of which marijuana and hemp are two varieties. THC is responsible for the high associated with the illicit use of marijuana.    Applicable to: All individuals currently taking or considering taking CBD  (cannabidiol) and, more important, all patients taking opioid analgesic controlled substances (pain medication). (Example: oxycodone; oxymorphone; hydrocodone; hydromorphone; morphine; methadone; tramadol; tapentadol; fentanyl; buprenorphine; butorphanol; dextromethorphan; meperidine; codeine; etc.)  Legal status: CBD remains a Schedule I drug prohibited for any use. CBD is illegal with one exception. In the [place], CBD has a limited Food and Drug Administration (FDA) approval for the treatment of two specific types of epilepsy disorders. Only one CBD product has been approved by the FDA for this purpose: "Epidiolex". FDA is aware that some companies are marketing products containing cannabis and cannabis-derived compounds in ways that violate the Federal Food, Drug and Cosmetic Act (FD&C Act) and that may put the health and safety of consumers at risk. The FDA, a Federal agency, has not enforced the CBD status since 2018. UPDATE: (11/20/2021) The Drug Enforcement Agency (DEA) issued a letter stating that "delta" cannabinoids, including Delta-8-THCO and Delta-9-THCO, synthetically derived from hemp do not qualify as hemp and will be viewed as Schedule I drugs. (Schedule I drugs, substances, or chemicals are defined as drugs with no currently accepted medical use and a high potential for abuse. Some examples of Schedule I drugs are: heroin, lysergic acid diethylamide (LSD), marijuana (cannabis), 3,4-methylenedioxymethamphetamine (ecstasy), methaqualone, and peyote.) (https://www.dea.gov)  Legality: Some manufacturers ship CBD products nationally, which is illegal. Often such products are sold online and are therefore available throughout the country. CBD is openly sold in head shops and health food stores in some states where such sales have not been explicitly legalized. Selling unapproved products with unsubstantiated therapeutic claims is not only a violation of the law, but also can put patients at  risk, as these products have not been proven to be safe or effective. Federal illegality makes it difficult to conduct research on CBD.  Reference: "FDA Regulation of Cannabis and Cannabis-Derived Products, Including Cannabidiol (CBD)" - https://www.fda.gov/news-events/public-health-focus/fda-regulation-cannabis-and-cannabis-derived-products-including-cannabidiol-cbd  Warning: CBD is not FDA approved and has not undergo the same manufacturing controls as prescription drugs.  This means that the purity and safety of available CBD may be questionable. Most of the time, despite manufacturer's claims, it is contaminated with THC (delta-9-tetrahydrocannabinol - the chemical in marijuana responsible for the "HIGH").  When this is the case, the THC contaminant will trigger a positive urine drug screen (UDS) test for Marijuana (carboxy-THC). Because a positive UDS for any illicit substance is a violation of our medication agreement, your opioid analgesics (pain medicine) may be permanently discontinued. The FDA recently put out a warning about 5 things that everyone should be aware of regarding Delta-8 THC: Delta-8 THC products have not been evaluated or approved by the FDA for safe use and may be marketed in ways that put the public health at risk. The FDA has received adverse event reports involving delta-8 THC-containing products. Delta-8 THC has psychoactive and intoxicating effects. Delta-8 THC manufacturing often involve use of potentially harmful chemicals to create the concentrations of delta-8 THC claimed in the marketplace. The final delta-8 THC product may have potentially harmful by-products (contaminants) due to the chemicals used in the process. Manufacturing of delta-8 THC products may occur in uncontrolled or unsanitary settings, which may lead to the presence of unsafe contaminants or other potentially harmful substances. Delta-8 THC products should be kept out of the reach of children and  pets.  MORE ABOUT CBD  General Information: CBD was discovered in 1940 and it is a derivative of the cannabis sativa genus plants (Marijuana   and Hemp). It is one of the 113 identified substances found in Marijuana. It accounts for up to 40% of the plant's extract. As of 2018, preliminary clinical studies on CBD included research for the treatment of anxiety, movement disorders, and pain. CBD is available and consumed in multiple forms, including inhalation of smoke or vapor, as an aerosol spray, and by mouth. It may be supplied as an oil containing CBD, capsules, dried cannabis, or as a liquid solution. CBD is thought not to be as psychoactive as THC (delta-9-tetrahydrocannabinol - the chemical in marijuana responsible for the "HIGH"). Studies suggest that CBD may interact with different biological target receptors in the body, including cannabinoid and other neurotransmitter receptors. As of 2018 the mechanism of action for its biological effects has not been determined.  Side-effects  Adverse reactions: Dry mouth, diarrhea, decreased appetite, fatigue, drowsiness, malaise, weakness, sleep disturbances, and others.  Drug interactions: CBC may interact with other medications such as blood-thinners. Because CBD causes drowsiness on its own, it also increases the drowsiness caused by other medications, including antihistamines (such as Benadryl), benzodiazepines (Xanax, Ativan, Valium), antipsychotics, antidepressants and opioids, as well as alcohol and supplements such as kava, melatonin and St. John's Wort. Be cautious with the following combinations:   Brivaracetam (Briviact) Brivaracetam is changed and broken down by the body. CBD might decrease how quickly the body breaks down brivaracetam. This might increase levels of brivaracetam in the body.  Caffeine Caffeine is changed and broken down by the body. CBD might decrease how quickly the body breaks down caffeine. This might increase levels of  caffeine in the body.  Carbamazepine (Tegretol) Carbamazepine is changed and broken down by the body. CBD might decrease how quickly the body breaks down carbamazepine. This might increase levels of carbamazepine in the body and increase its side effects.  Citalopram (Celexa) Citalopram is changed and broken down by the body. CBD might decrease how quickly the body breaks down citalopram. This might increase levels of citalopram in the body and increase its side effects.  Clobazam (Onfi) Clobazam is changed and broken down by the liver. CBD might decrease how quickly the liver breaks down clobazam. This might increase the effects and side effects of clobazam.  Eslicarbazepine (Aptiom) Eslicarbazepine is changed and broken down by the body. CBD might decrease how quickly the body breaks down eslicarbazepine. This might increase levels of eslicarbazepine in the body by a small amount.  Everolimus (Zostress) Everolimus is changed and broken down by the body. CBD might decrease how quickly the body breaks down everolimus. This might increase levels of everolimus in the body.  Lithium Taking higher doses of CBD might increase levels of lithium. This can increase the risk of lithium toxicity.  Medications changed by the liver (Cytochrome P450 1A1 (CYP1A1) substrates) Some medications are changed and broken down by the liver. CBD might change how quickly the liver breaks down these medications. This could change the effects and side effects of these medications.  Medications changed by the liver (Cytochrome P450 1A2 (CYP1A2) substrates) Some medications are changed and broken down by the liver. CBD might change how quickly the liver breaks down these medications. This could change the effects and side effects of these medications.  Medications changed by the liver (Cytochrome P450 1B1 (CYP1B1) substrates) Some medications are changed and broken down by the liver. CBD might change how quickly the  liver breaks down these medications. This could change the effects and side effects of these medications.  Medications changed   by the liver (Cytochrome P450 2A6 (CYP2A6) substrates) Some medications are changed and broken down by the liver. CBD might change how quickly the liver breaks down these medications. This could change the effects and side effects of these medications.  Medications changed by the liver (Cytochrome P450 2B6 (CYP2B6) substrates) Some medications are changed and broken down by the liver. CBD might change how quickly the liver breaks down these medications. This could change the effects and side effects of these medications.  Medications changed by the liver (Cytochrome P450 2C19 (CYP2C19) substrates) Some medications are changed and broken down by the liver. CBD might change how quickly the liver breaks down these medications. This could change the effects and side effects of these medications.  Medications changed by the liver (Cytochrome P450 2C8 (CYP2C8) substrates) Some medications are changed and broken down by the liver. CBD might change how quickly the liver breaks down these medications. This could change the effects and side effects of these medications.  Medications changed by the liver (Cytochrome P450 2C9 (CYP2C9) substrates) Some medications are changed and broken down by the liver. CBD might change how quickly the liver breaks down these medications. This could change the effects and side effects of these medications.  Medications changed by the liver (Cytochrome P450 2D6 (CYP2D6) substrates) Some medications are changed and broken down by the liver. CBD might change how quickly the liver breaks down these medications. This could change the effects and side effects of these medications.  Medications changed by the liver (Cytochrome P450 2E1 (CYP2E1) substrates) Some medications are changed and broken down by the liver. CBD might change how quickly the liver  breaks down these medications. This could change the effects and side effects of these medications.  Medications changed by the liver (Cytochrome P450 3A4 (CYP3A4) substrates) Some medications are changed and broken down by the liver. CBD might change how quickly the liver breaks down these medications. This could change the effects and side effects of these medications.  Medications changed by the liver (Glucuronidated drugs) Some medications are changed and broken down by the liver. CBD might change how quickly the liver breaks down these medications. This could change the effects and side effects of these medications.  Medications that decrease the breakdown of other medications by the liver (Cytochrome P450 2C19 (CYP2C19) inhibitors) CBD is changed and broken down by the liver. Some drugs decrease how quickly the liver changes and breaks down CBD. This could change the effects and side effects of CBD.  Medications that decrease the breakdown of other medications in the liver (Cytochrome P450 3A4 (CYP3A4) inhibitors) CBD is changed and broken down by the liver. Some drugs decrease how quickly the liver changes and breaks down CBD. This could change the effects and side effects of CBD.  Medications that increase breakdown of other medications by the liver (Cytochrome P450 3A4 (CYP3A4) inducers) CBD is changed and broken down by the liver. Some drugs increase how quickly the liver changes and breaks down CBD. This could change the effects and side effects of CBD.  Medications that increase the breakdown of other medications by the liver (Cytochrome P450 2C19 (CYP2C19) inducers) CBD is changed and broken down by the liver. Some drugs increase how quickly the liver changes and breaks down CBD. This could change the effects and side effects of CBD.  Methadone (Dolophine) Methadone is broken down by the liver. CBD might decrease how quickly the liver breaks down methadone. Taking cannabidiol along  with methadone might   increase the effects and side effects of methadone.  Rufinamide (Banzel) Rufinamide is changed and broken down by the body. CBD might decrease how quickly the body breaks down rufinamide. This might increase levels of rufinamide in the body by a small amount.  Sedative medications (CNS depressants) CBD might cause sleepiness and slowed breathing. Some medications, called sedatives, can also cause sleepiness and slowed breathing. Taking CBD with sedative medications might cause breathing problems and/or too much sleepiness.  Sirolimus (Rapamune) Sirolimus is changed and broken down by the body. CBD might decrease how quickly the body breaks down sirolimus. This might increase levels of sirolimus in the body.  Stiripentol (Diacomit) Stiripentol is changed and broken down by the body. CBD might decrease how quickly the body breaks down stiripentol. This might increase levels of stiripentol in the body and increase its side effects.  Tacrolimus (Prograf) Tacrolimus is changed and broken down by the body. CBD might decrease how quickly the body breaks down tacrolimus. This might increase levels of tacrolimus in the body.  Tamoxifen (Soltamox) Tamoxifen is changed and broken down by the body. CBD might affect how quickly the body breaks down tamoxifen. This might affect levels of tamoxifen in the body.  Topiramate (Topamax) Topiramate is changed and broken down by the body. CBD might decrease how quickly the body breaks down topiramate. This might increase levels of topiramate in the body by a small amount.  Valproate Valproic acid can cause liver injury. Taking cannabidiol with valproic acid might increase the chance of liver injury. CBD and/or valproic acid might need to be stopped, or the dose might need to be reduced.  Warfarin (Coumadin) CBD might increase levels of warfarin, which can increase the risk for bleeding. CBD and/or warfarin might need to be stopped, or the  dose might need to be reduced.  Zonisamide Zonisamide is changed and broken down by the body. CBD might decrease how quickly the body breaks down zonisamide. This might increase levels of zonisamide in the body by a small amount. (Last update: 12/02/2021) ____________________________________________________________________________________________  ____________________________________________________________________________________________  Patient Information update  To: All of our patients.  Re: Name change.  It has been made official that our current name, "[facility]"   will soon be changed "[facility]".   The purpose of this change is to eliminate any confusion created by the concept of our practice being a "Pain Clinic". In the past this has led to the misconception that we treat pain primarily by the use of prescription medications.  Nothing can be farther from the truth.   Understanding PAIN MANAGEMENT: To further understand what our practice does, you first have to understand that "Pain Management" is a subspecialty that requires additional training once a physician has completed their specialty training, which can be in either Anesthesia, Neurology, Psychiatry, or Physical Medicine and Rehabilitation (PMR). Each one of these contributes to the final approach taken by each physician to the management of their patient's pain. To be a "Pain Management Specialist" you must have completed one of the specialty trainings below.  Anesthesiologists are trained in clinical pharmacology and interventional techniques such as nerve blockade and regional as well as central neuroanatomy. They are trained to block pain before, during, and after surgical interventions.  Neurologists are trained in the diagnosis and pharmacological treatment of complex neurological conditions, such as  Multiple Sclerosis, Parkinson's, spinal cord injuries, and other systemic conditions that may be associated with   symptoms that may include but are not limited to pain. They tend to rely primarily on the treatment of chronic pain using prescription medications.  Psychiatrist are trained in conditions affecting the psychosocial wellbeing of patients including but not limited to depression, anxiety, schizophrenia, personality disorders, addiction, and other substance use disorders that may be associated with chronic pain. They tend to rely primarily on the treatment of chronic pain using prescription medications.   Physical Medicine and Rehabilitation (PMR) physicians, also known as physiatrists, treat a wide variety of medical conditions affecting the brain, spinal cord, nerves, bones, joints, ligaments, muscles, and tendons. Their training is primarily aimed at treating patients that have suffered injuries that have caused severe physical impairment. They are trained in the physical therapy and rehabilitation of those patients. They may also work alongside orthopedic surgeons or neurosurgeons using their expertise in assisting patients to recover after their surgery.  INTERVENTIONAL PAIN MANAGEMENT is s sub-subspecialty of Pain Management.  Our physicians are Board-certified in Anesthesia, Pain Management, and Interventional Pain Management.  This meaning that not only have they been trained and Board-certified in their specialty of Anesthesia, subspecialty of Pain Management, but they have also received further training in the sub-subspecialty of Interventional Pain Management, in order to be Board-certified as INTERVENTIONAL PAIN MANAGEMENT SPECIALIST.    Mission: Our goal is to use INTERVENTIONAL PAIN MANAGEMENT techniques as an alternative option to the chronic use of prescription medication for the treatment of pain. To make this clear, we have changed our name and we will no longer be taking patients  for medication management. We will continue to offer medication management assessment and recommendations, but we will not be taking over any patient's medication regimen.   ____________________________________________________________________________________________   

## 2022-07-26 NOTE — Progress Notes (Signed)
Nursing Pain Medication Assessment:  Safety precautions to be maintained throughout the outpatient stay will include: orient to surroundings, keep bed in low position, maintain call bell within reach at all times, provide assistance with transfer out of bed and ambulation.  Medication Inspection Compliance: Pill count conducted under aseptic conditions, in front of the patient. Neither the pills nor the bottle was removed from the patient's sight at any time. Once count was completed pills were immediately returned to the patient in their original bottle.  Medication: Tramadol (Ultram) Pill/Patch Count:  37 of 240 pills remain Pill/Patch Appearance: Markings consistent with prescribed medication Bottle Appearance: Standard pharmacy container. Clearly labeled. Filled Date: 09 / 27 / 2023 Last Medication intake:  Today

## 2022-08-02 ENCOUNTER — Encounter: Payer: Medicare HMO | Admitting: Pain Medicine

## 2022-10-01 ENCOUNTER — Other Ambulatory Visit: Payer: Self-pay | Admitting: Unknown Physician Specialty

## 2022-10-01 DIAGNOSIS — J320 Chronic maxillary sinusitis: Secondary | ICD-10-CM

## 2022-10-20 ENCOUNTER — Ambulatory Visit
Admission: RE | Admit: 2022-10-20 | Discharge: 2022-10-20 | Disposition: A | Discharge status: Home / Self Care | Payer: Medicare HMO | Source: Ambulatory Visit | Attending: Unknown Physician Specialty | Admitting: Unknown Physician Specialty

## 2022-10-20 DIAGNOSIS — J320 Chronic maxillary sinusitis: Secondary | ICD-10-CM

## 2022-11-21 NOTE — Patient Instructions (Signed)

## 2022-11-22 ENCOUNTER — Encounter: Payer: Self-pay | Admitting: Pain Medicine

## 2022-11-22 ENCOUNTER — Ambulatory Visit: Payer: Medicare HMO | Attending: Pain Medicine | Admitting: Pain Medicine

## 2022-11-22 VITALS — BP 131/85 | HR 101 | Temp 97.4°F | Ht 72.0 in | Wt 186.0 lb

## 2022-11-22 DIAGNOSIS — M25551 Pain in right hip: Secondary | ICD-10-CM | POA: Diagnosis not present

## 2022-11-22 DIAGNOSIS — G8929 Other chronic pain: Secondary | ICD-10-CM | POA: Insufficient documentation

## 2022-11-22 DIAGNOSIS — M4310 Spondylolisthesis, site unspecified: Secondary | ICD-10-CM

## 2022-11-22 DIAGNOSIS — M47816 Spondylosis without myelopathy or radiculopathy, lumbar region: Secondary | ICD-10-CM | POA: Diagnosis not present

## 2022-11-22 DIAGNOSIS — M79605 Pain in left leg: Secondary | ICD-10-CM | POA: Diagnosis present

## 2022-11-22 DIAGNOSIS — M545 Low back pain, unspecified: Secondary | ICD-10-CM | POA: Diagnosis not present

## 2022-11-22 DIAGNOSIS — M25552 Pain in left hip: Secondary | ICD-10-CM | POA: Diagnosis present

## 2022-11-22 DIAGNOSIS — M79604 Pain in right leg: Secondary | ICD-10-CM

## 2022-11-22 NOTE — Progress Notes (Signed)
Safety precautions to be maintained throughout the outpatient stay will include: orient to surroundings, keep bed in low position, maintain call bell within reach at all times, provide assistance with transfer out of bed and ambulation.  

## 2022-11-22 NOTE — Progress Notes (Signed)
PROVIDER NOTE: Information contained herein reflects review and annotations entered in association with encounter. Interpretation of such information and data should be left to medically-trained personnel. Information provided to patient can be located elsewhere in the medical record under "Patient Instructions". Document created using STT-dictation technology, any transcriptional errors that may result from process are unintentional.    Patient: Caleb Edwards  Service Category: E/M  Provider: Gaspar Cola, MD  DOB: Oct 22, 1950  DOS: 11/22/2022  Referring Provider: Cherylann Parr, MD  MRN: [id number]  Specialty: Interventional Pain Management  PCP: Cherylann Parr, MD  Type: Established Patient  Setting: Ambulatory outpatient    Location: Office  Delivery: Face-to-face     HPI  Caleb Edwards, a 72 y.o. year old male, is here today because of his Chronic bilateral low back pain without sciatica [M54.50, G89.29]. Caleb Edwards primary complain today is Back Pain (lower) Last encounter: My last encounter with him was on 07/26/2022. 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); Chronic neck pain; Cervical spondylosis; Chronic pain syndrome; Abdominal pain; Grade 1 Anterolisthesis (7-16m) of L5 over S1; DDD (degenerative disc disease), lumbosacral; Lumbar facet arthropathy; Chronic hip pain (2ry area of Pain) (Bilateral) (R>L); Cervical radiculopathy (Left); Cervicalgia; DDD (degenerative disc disease), cervical; Abnormal x-ray of cervical spine; 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 Pain Assessment: Severity of Chronic pain is reported as a 8 /10. Location: Back Left, Right, Lower/pain radiaities down his legs at times. Onset: More than a month ago. Quality: Constant, Aching, Discomfort, Stabbing, Radiating. Timing: Constant. Modifying factor(s): pain cream, repositioning. Vitals:  height is 6' (1.829 m) and weight is 186 lb (84.4 kg). His temperature is 97.4 F (36.3 C) (abnormal). His blood pressure is 131/85 and his pulse is 101 (abnormal). His oxygen saturation is 98%.  BMI: Estimated body mass index is 25.23 kg/m as calculated from the following:   Height as of this encounter: 6' (1.829 m).   Weight as of this encounter: 186 lb (84.4 kg).  Reason for encounter: evaluation for possible interventional PM therapy/treatment.  The patient comes into the clinic today indicating a flareup of his low back pain (Bilateral) (R>L).  He also refers having some pain in the upper back, but this may be referred from the paravertebral muscles.  Physical exam and x-rays have demonstrated a prior history of lumbar facet disease.  On 06/30/2021 I did a bilateral lumbar facet block which provided the patient with 100% relief of the pain for the duration of the local anesthetic which actually persisted past the 2-week follow-up evaluation.  He indicates that he is interested in having this repeated.  He actually also requested to have an injection done in the neck region, but I have explained to the patient that I rather wait and see if the pain improves after this facet block.  If it does not, then we can address at  a later time.  He understood and accepted.  Initially I told the patient that I would be sending refills of his medication to the pharmacy, but then I realized that his refills should be good until 01/27/2023.  I will wait until I am closer to that time to do his medication refills on his tramadol.  RTCB: 01/27/2023 (scheduled to return on 01/26/2023) Last interventional therapy (12/15/2021): Right cervical ESI #1 (results: 90/90/90/90)   Pharmacotherapy Assessment  Analgesic: Tramadol 50 mg, 2 tabs PO q 6 hrs (400 mg/day of tramadol) MME/day: 40 mg/day.   Monitoring: [place] PMP: PDMP reviewed during this encounter.       Pharmacotherapy: No side-effects or adverse reactions reported. Compliance: No problems identified. Effectiveness: Clinically acceptable.  Chauncey Fischer, RN  11/22/2022  2:34 PM  Sign when Signing Visit Safety precautions to be maintained throughout the outpatient stay will include: orient to surroundings, keep bed in low position, maintain call bell within reach at all times, provide assistance with transfer out of bed and ambulation.     No results found for: "CBDTHCR" No results found for: "D8THCCBX" No results found for: "D9THCCBX"  UDS:  Summary  Date Value Ref Range Status  06/28/2022 Note  Final    Comment:    ==================================================================== [employer] 13 (MW) ==================================================================== Test                             Result       Flag       Units  Drug Present and Declared for Prescription Verification   Tramadol                       >11111       EXPECTED   ng/mg creat   O-Desmethyltramadol            >11111       EXPECTED   ng/mg creat   N-Desmethyltramadol            2920         EXPECTED   ng/mg creat    Source of tramadol is a prescription medication. O-desmethyltramadol    and N-desmethyltramadol are expected metabolites of  tramadol.  ==================================================================== Test                      Result    Flag   Units      Ref Range   Creatinine              45               mg/dL      >=20 ==================================================================== Declared Medications:  The flagging and interpretation on this report are based on the  following declared medications.  Unexpected results may arise from  inaccuracies in the declared medications.   **Note: The testing scope of this panel includes these medications:   Tramadol (Ultram)   **Note: The testing scope of this panel does not include the  following reported medications:   Amlodipine (Norvasc)  Chlorthalidone (Hygroton)  Ibuprofen (Advil)  Iron  Lisinopril (Zestril)  Pantoprazole (Protonix)  Rosuvastatin (Crestor)  Sildenafil (Viagra)  Trazodone (Desyrel) ==================================================================== For clinical consultation, please call [phone]. ====================================================================       ROS  Constitutional: Denies any fever or chills Gastrointestinal: No reported hemesis, hematochezia, vomiting, or acute GI distress Musculoskeletal: Denies any acute onset joint swelling, redness, loss of ROM, or weakness  Neurological: No reported episodes of acute onset apraxia, aphasia, dysarthria, agnosia, amnesia, paralysis, loss of coordination, or loss of consciousness  Medication Review  amLODipine, chlorthalidone, ferrous sulfate, ibuprofen, lisinopril, naloxone, pantoprazole, rosuvastatin, sildenafil, traMADol, and traZODone  History Review  Allergy: Caleb Edwards has No Known Allergies. Drug: Caleb Edwards  reports no history of drug use. Alcohol:  reports current alcohol use of about 1.0 standard drink of alcohol per week. Tobacco:  reports that he has quit smoking. He has been exposed to tobacco smoke. He has never used smokeless  tobacco. Social: Caleb Edwards  reports that he has quit smoking. He has been exposed to tobacco smoke. He has never used smokeless tobacco. He reports current alcohol use of about 1.0 standard drink of alcohol per week. He reports that he does not use drugs. Medical:  has a past medical history of Back pain, Blood transfusion without reported diagnosis (12/2015), Diverticulitis, Hypertension, Kidney stone, and Ulcer, stomach peptic, chronic. Surgical: Caleb Edwards  has a past surgical history that includes Esophagogastroduodenoscopy (egd) with propofol (N/A, 01/03/2016); Colonoscopy with propofol (N/A, 08/02/2017); Esophagogastroduodenoscopy (egd) with propofol (08/02/2017); Esophagogastroduodenoscopy (egd) with propofol (N/A, 02/05/2018); Colonoscopy with propofol (N/A, 02/05/2018); Esophagogastroduodenoscopy (egd) with propofol (N/A, 03/30/2018); Esophagogastroduodenoscopy (egd) with propofol (N/A, 08/09/2019); Colonoscopy with propofol (N/A, 08/09/2019); Givens capsule study (N/A, 04/24/2020); and Esophagogastroduodenoscopy (egd) with propofol (N/A, 04/24/2020). Family: family history includes Diabetes in his mother; Heart disease in his father; Prostate cancer in his brother.  Laboratory Chemistry Profile   Renal Lab Results  Component Value Date   BUN 18 02/21/2020   CREATININE 1.24 02/21/2020   GFRAA >60 02/21/2020   GFRNONAA 60 (L) 02/21/2020    Hepatic Lab Results  Component Value Date   AST 23 08/09/2019   ALT 15 08/09/2019   ALBUMIN 3.8 08/09/2019   ALKPHOS 53 08/09/2019   LIPASE 17 08/08/2019    Electrolytes Lab Results  Component Value Date   NA 138 08/09/2019   K 3.9 08/09/2019   CL 106 08/09/2019   CALCIUM 8.7 (L) 08/09/2019   MG 1.9 01/04/2016    Bone Lab Results  Component Value Date   VD25OH 24.0 (L) 12/26/2015    Inflammation (CRP: Acute Phase) (ESR: Chronic Phase) Lab Results  Component Value Date   CRP 0.6 12/26/2015   ESRSEDRATE 3 12/26/2015         Note: Above Lab  results reviewed.  Recent Imaging Review  CT MAXILLOFACIAL WO CONTRAST CLINICAL DATA:  Chronic maxillary sinusitis for years No hx of surgery, nor CA Prior CT maxillofacial w/o contrast 11/25/21 CT head 10/13/21  EXAM: CT MAXILLOFACIAL WITHOUT CONTRAST  TECHNIQUE: Multidetector CT imaging of the maxillofacial structures was performed. Multiplanar CT image reconstructions were also generated.  RADIATION DOSE REDUCTION: This exam was performed according to the departmental dose-optimization program which includes automated exposure control, adjustment of the mA and/or kV according to patient size and/or use of iterative reconstruction technique.  COMPARISON:  CT max face 11/25/2021  FINDINGS: Osseous: No fracture or mandibular dislocation. No destructive process. Right temporomandibular joint degenerative changes.  Orbits: Negative. No traumatic or inflammatory finding.  Sinuses: Almost complete opacification of the left maxillary sinus with associated maxillary sinus wall hypertrophy consistent with chronic sinusitis. Otherwise remaining visualized paranasal sinuses and mastoid air cells are clear.  Soft tissues: Negative.  Limited intracranial: No significant or unexpected finding.  IMPRESSION: Chronic left maxillary sinusitis. Correlate with signs and symptoms for acute sinusitis.  Electronically Signed   By: Clelia Croft.D.  On: 10/20/2022 17:33 Note: Reviewed        Physical Exam  General appearance: Well nourished, well developed, and well hydrated. In no apparent acute distress Mental status: Alert, oriented x 3 (person, place, & time)       Respiratory: No evidence of acute respiratory distress Eyes: PERLA Vitals: BP 131/85   Pulse (!) 101   Temp (!) 97.4 F (36.3 C)   Ht 6' (1.829 m)   Wt 186 lb (84.4 kg)   SpO2 98%   BMI 25.23 kg/m  BMI: Estimated body mass index is 25.23 kg/m as calculated from the following:   Height as of this encounter: 6'  (1.829 m).   Weight as of this encounter: 186 lb (84.4 kg). Ideal: Ideal body weight: 77.6 kg (171 lb 1.2 oz) Adjusted ideal body weight: 80.3 kg (177 lb 0.7 oz)  Assessment   Diagnosis Status  1. Chronic low back pain (1ry area of Pain) (Bilateral) (R>L) w/o sciatica   2. Chronic hip pain (2ry area of Pain) (Bilateral) (R>L)   3. Grade 1 Anterolisthesis (7-57m) of L5 over S1   4. Lumbar facet syndrome (Bilateral) (R>L)   5. Chronic lower extremity pain (3ry area of Pain) (Bilateral) (R>L)    Controlled Controlled Controlled   Updated Problems: No problems updated.  Plan of Care  Problem-specific:  No problem-specific Assessment & Plan notes found for this encounter.  Mr. WWATARU ALVILLARhas a current medication list which includes the following long-term medication(s): chlorthalidone, lisinopril, pantoprazole, rosuvastatin, tramadol, and trazodone.  Pharmacotherapy (Medications Ordered): No orders of the defined types were placed in this encounter.  Orders:  Orders Placed This Encounter  Procedures   LUMBAR FACET(MEDIAL BRANCH NERVE BLOCK) MBNB    Standing Status:   Future    Standing Expiration Date:   02/20/2023    Scheduling Instructions:     Procedure: Lumbar facet block (AKA.: Lumbosacral medial branch nerve block)     Side: Bilateral     Level: L3-4, L4-5, and L5-S1 Facets (L2, L3, L4, L5, and S1 Medial Branch)     Sedation: With Sedation.     Timeframe: ASAP    Order Specific Question:   Where will this procedure be performed?    Answer:   [facility]   Follow-up plan:   Return for (ECT): (B) L-FCT Blk #2.     Interventional Therapies  Risk Factors  Considerations:   Stage3 CKD  HTN  Hx GI Bleed  Anemia w/ near syncope   Planned  Pending:   Diagnostic bilateral lumbar facet MBB #2     Under consideration:   Diagnostic right IA hip joint injection  Diagnostic bilateral lumbar facet block  Possible bilateral lumbar facet RFA    Completed:    Therapeutic midline C6-7 interspinous ligament inj. x1 (06/28/2022) (80/90/75/75)  Diagnostic bilateral lumbar facet MBB x1 (06/30/2021) (100/100/100/100)  Therapeutic left cervical ESI x2 (02/05/2021) (100/100/70 x 10 days/50)  Therapeutic right cervical ESI x1 (12/15/2021) (90/90/90/90)  Therapeutic left L3-4 LESI x2 (06/13/2018) (100/100/85/85)  Therapeutic left L3 TFESI x1 (03/13/2020) (95/95/60/>50)  Therapeutic left L4 TFESI x2 (03/13/2020) (95/95/60/>50)  Therapeutic right L3 TFESI x2 (03/13/2020) (95/95/60/>50)  Therapeutic right L4 TFESI x3 (03/13/2020) (95/95/60/>50)  Referral to neurosurgery (Elayne Guerin MD) for cervical instability (11/11/2020)    Therapeutic  Palliative (PRN) options:   Palliative left L3-4 LESI   Palliative left L3 TFESI   Palliative left L4 TFESI   Palliative right L3 TFESI  Palliative right L4 TFESI        Recent Visits No visits were found meeting these conditions. Showing recent visits within past 90 days and meeting all other requirements Today's Visits Date Type Provider Dept  11/22/22 Office Visit Milinda Pointer, MD [facility]  Showing today's visits and meeting all other requirements Future Appointments Date Type Provider Dept  01/26/23 Appointment Milinda Pointer, MD [facility]  Showing future appointments within next 90 days and meeting all other requirements  I discussed the assessment and treatment plan with the patient. The patient was provided an opportunity to ask questions and all were answered. The patient agreed with the plan and demonstrated an understanding of the instructions.  Patient advised to call back or seek an in-person evaluation if the symptoms or condition worsens.  Duration of encounter: 30 minutes.  Total time on encounter, as per AMA guidelines included both the face-to-face and non-face-to-face time personally spent by the physician and/or other qualified health care  professional(s) on the day of the encounter (includes time in activities that require the physician or other qualified health care professional and does not include time in activities normally performed by clinical staff). Physician's time may include the following activities when performed: Preparing to see the patient (e.g., pre-charting review of records, searching for previously ordered imaging, lab work, and nerve conduction tests) Review of prior analgesic pharmacotherapies. Reviewing PMP Interpreting ordered tests (e.g., lab work, imaging, nerve conduction tests) Performing post-procedure evaluations, including interpretation of diagnostic procedures Obtaining and/or reviewing separately obtained history Performing a medically appropriate examination and/or evaluation Counseling and educating the patient/family/caregiver Ordering medications, tests, or procedures Referring and communicating with other health care professionals (when not separately reported) Documenting clinical information in the electronic or other health record Independently interpreting results (not separately reported) and communicating results to the patient/ family/caregiver Care coordination (not separately reported)  Note by: Gaspar Cola, MD Date: 11/22/2022; Time: 3:38 PM

## 2022-11-26 ENCOUNTER — Telehealth: Payer: Self-pay | Admitting: Pain Medicine

## 2022-11-26 NOTE — Telephone Encounter (Signed)
PT stated that he is currently having severe pain in the area that he will be having an procedure on 12-02-22. PT wonder if he needs xrays done. Please give patient a call. TY

## 2022-11-26 NOTE — Telephone Encounter (Signed)
Will address with  Dr Dossie Arbour on Monday.

## 2022-11-30 ENCOUNTER — Telehealth: Payer: Self-pay

## 2022-11-30 ENCOUNTER — Other Ambulatory Visit: Payer: Self-pay | Admitting: Pain Medicine

## 2022-11-30 DIAGNOSIS — M5137 Other intervertebral disc degeneration, lumbosacral region: Secondary | ICD-10-CM

## 2022-11-30 DIAGNOSIS — M545 Low back pain, unspecified: Secondary | ICD-10-CM

## 2022-11-30 DIAGNOSIS — M4310 Spondylolisthesis, site unspecified: Secondary | ICD-10-CM

## 2022-11-30 NOTE — Telephone Encounter (Signed)
Spoke with patient and he states that he was having this pain when he spoke with  you last.  States he will come in for his procedure on Thursday and discuss it then,.  There has been no trauma or anything like that.

## 2022-11-30 NOTE — Telephone Encounter (Signed)
Notified patient of xray order and to try and do it today.

## 2022-12-01 ENCOUNTER — Encounter: Payer: Self-pay | Admitting: Unknown Physician Specialty

## 2022-12-02 ENCOUNTER — Ambulatory Visit: Payer: Medicare HMO | Attending: Pain Medicine | Admitting: Pain Medicine

## 2022-12-02 ENCOUNTER — Encounter: Payer: Self-pay | Admitting: Pain Medicine

## 2022-12-02 ENCOUNTER — Ambulatory Visit
Admission: RE | Admit: 2022-12-02 | Discharge: 2022-12-02 | Disposition: A | Discharge status: Home / Self Care | Payer: Medicare HMO | Source: Ambulatory Visit | Attending: Pain Medicine | Admitting: Pain Medicine

## 2022-12-02 VITALS — BP 158/97 | HR 73 | Temp 97.9°F | Resp 15 | Ht 72.0 in | Wt 185.0 lb

## 2022-12-02 DIAGNOSIS — M4310 Spondylolisthesis, site unspecified: Secondary | ICD-10-CM

## 2022-12-02 DIAGNOSIS — M545 Low back pain, unspecified: Secondary | ICD-10-CM | POA: Diagnosis present

## 2022-12-02 DIAGNOSIS — M25551 Pain in right hip: Secondary | ICD-10-CM | POA: Insufficient documentation

## 2022-12-02 DIAGNOSIS — M5137 Other intervertebral disc degeneration, lumbosacral region: Secondary | ICD-10-CM | POA: Insufficient documentation

## 2022-12-02 DIAGNOSIS — M5459 Other low back pain: Secondary | ICD-10-CM | POA: Diagnosis present

## 2022-12-02 DIAGNOSIS — M25552 Pain in left hip: Secondary | ICD-10-CM | POA: Insufficient documentation

## 2022-12-02 DIAGNOSIS — M51379 Other intervertebral disc degeneration, lumbosacral region without mention of lumbar back pain or lower extremity pain: Secondary | ICD-10-CM

## 2022-12-02 DIAGNOSIS — M47817 Spondylosis without myelopathy or radiculopathy, lumbosacral region: Secondary | ICD-10-CM | POA: Diagnosis present

## 2022-12-02 DIAGNOSIS — M533 Sacrococcygeal disorders, not elsewhere classified: Secondary | ICD-10-CM | POA: Insufficient documentation

## 2022-12-02 DIAGNOSIS — M47816 Spondylosis without myelopathy or radiculopathy, lumbar region: Secondary | ICD-10-CM | POA: Diagnosis present

## 2022-12-02 DIAGNOSIS — G8929 Other chronic pain: Secondary | ICD-10-CM

## 2022-12-02 MED ORDER — FENTANYL CITRATE (PF) 100 MCG/2ML IJ SOLN: 25.0000 ug | INTRAMUSCULAR | Status: DC | PRN

## 2022-12-02 MED ORDER — LIDOCAINE HCL 2 % IJ SOLN
20.0000 mL | Freq: Once | INTRAMUSCULAR | Status: AC
  Administered 2022-12-02: 400 mg
  Filled 2022-12-02: qty 20

## 2022-12-02 MED ORDER — PENTAFLUOROPROP-TETRAFLUOROETH EX AERO: INHALATION_SPRAY | Freq: Once | CUTANEOUS | Status: DC

## 2022-12-02 MED ORDER — ROPIVACAINE HCL 2 MG/ML IJ SOLN
18.0000 mL | Freq: Once | INTRAMUSCULAR | Status: AC
  Administered 2022-12-02: 18 mL via PERINEURAL
  Filled 2022-12-02: qty 20

## 2022-12-02 MED ORDER — MIDAZOLAM HCL 5 MG/5ML IJ SOLN: 0.5000 mg | Freq: Once | INTRAMUSCULAR | Status: DC

## 2022-12-02 MED ORDER — LACTATED RINGERS IV SOLN: Freq: Once | INTRAVENOUS | Status: DC

## 2022-12-02 MED ORDER — TRIAMCINOLONE ACETONIDE 40 MG/ML IJ SUSP
80.0000 mg | Freq: Once | INTRAMUSCULAR | Status: AC
  Administered 2022-12-02: 80 mg
  Filled 2022-12-02: qty 2

## 2022-12-02 NOTE — Discharge Instructions (Signed)
[facility] ENDOSCOPIC SINUS SURGERY [facility]  What is Functional Endoscopic Sinus Surgery?  The Surgery involves making the natural openings of the sinuses larger by removing the bony partitions that separate the sinuses from the nasal cavity.  The natural sinus lining is preserved as much as possible to allow the sinuses to resume normal function after the surgery.  In some patients nasal polyps (excessively swollen lining of the sinuses) may be removed to relieve obstruction of the sinus openings.  The surgery is performed through the nose using lighted scopes, which eliminates the need for incisions on the face.  A septoplasty is a different procedure which is sometimes performed with sinus surgery.  It involves straightening the boy partition that separates the two sides of your nose.  A crooked or deviated septum may need repair if is obstructing the sinuses or nasal airflow.  Turbinate reduction is also often performed during sinus surgery.  The turbinates are bony proturberances from the side walls of the nose which swell and can obstruct the nose in patients with sinus and allergy problems.  Their size can be surgically reduced to help relieve nasal obstruction.  What Can Sinus Surgery Do For Me?  Sinus surgery can reduce the frequency of sinus infections requiring antibiotic treatment.  This can provide improvement in nasal congestion, post-nasal drainage, facial pressure and nasal obstruction.  Surgery will NOT prevent you from ever having an infection again, so it usually only for patients who get infections 4 or more times yearly requiring antibiotics, or for infections that do not clear with antibiotics.  It will not cure nasal allergies, so patients with allergies may still require medication to treat their allergies after surgery. Surgery may improve headaches related to sinusitis, however, some people will continue to  require medication to control sinus headaches related to allergies.  Surgery will do nothing for other forms of headache (migraine, tension or cluster).  What Are the Risks of Endoscopic Sinus Surgery?  Current techniques allow surgery to be performed safely with little risk, however, there are rare complications that patients should be aware of.  Because the sinuses are located around the eyes, there is risk of eye injury, including blindness, though again, this would be quite rare. This is usually a result of bleeding behind the eye during surgery, which can effect vision, though there are treatments to protect the vision and prevent permanent injury. More serious complications would include bleeding inside the brain cavity or damage to the brain.This happens when the fluid around the brain leaks out into the sinus cavity.  Again, all of these complications are uncommon, and spinal fluid leaks can be safely managed surgically if they occur.  The most common complication of sinus surgery is bleeding from the nose, which may require packing or cauterization of the nose.  Patients with polyps may experience recurrence of the polyps that would require revision surgery.  Alterations of sense of smell or injury to the tear ducts are also rare complications.   What is the Surgery Like, and what is the Recovery?  The Surgery usually takes a couple of hours to perform, and is usually performed under a general anesthetic (completely asleep).  Patients are usually discharged home after a couple of hours.  Sometimes during surgery it is necessary to pack the nose to control bleeding, and the packing is left in place for 24 - 48 hours, and removed by your surgeon.  If   a septoplasty was performed during the procedure, there is often a splint placed which must be removed after 5-7 days.   Discomfort: Pain is usually mild to moderate, and can be controlled by prescription pain medication or acetaminophen (Tylenol).   Aspirin, Ibuprofen (Advil, Motrin), or Naprosyn (Aleve) should be avoided, as they can cause increased bleeding.  Most patients feel sinus pressure like they have a bad head cold for several days.  Sleeping with your head elevated can help reduce swelling and facial pressure, as can ice packs over the face.  A humidifier may be helpful to keep the mucous and blood from drying in the nose.   Diet: There are no specific diet restrictions, however, you should generally start with clear liquids and a light diet of bland foods because the anesthetic can cause some nausea.  Advance your diet depending on how your stomach feels.  Taking your pain medication with food will often help reduce stomach upset which pain medications can cause.  Nasal Saline Irrigation: It is important to remove blood clots and dried mucous from the nose as it is healing.  This is done by having you irrigate the nose at least 3 - 4 times daily with a salt water solution.  We recommend using NeilMed Sinus Rinse (available at the drug store).  Fill the squeeze bottle with the solution, bend over a sink, and insert the tip of the squeeze bottle into the nose  of an inch.  Point the tip of the squeeze bottle towards the inside corner of the eye on the same side your irrigating.  Squeeze the bottle and gently irrigate the nose.  If you bend forward as you do this, most of the fluid will flow back out of the nose, instead of down your throat.   The solution should be warm, near body temperature, when you irrigate.   Each time you irrigate, you should use a full squeeze bottle.   Note that if you are instructed to use Nasal Steroid Sprays at any time after your surgery, irrigate with saline BEFORE using the steroid spray, so you do not wash it all out of the nose. Another product, Nasal Saline Gel (such as AYR Nasal Saline Gel) can be applied in each nostril 3 - 4 times daily to moisture the nose and reduce scabbing or crusting.  Bleeding:   Bloody drainage from the nose can be expected for several days, and patients are instructed to irrigate their nose frequently with salt water to help remove mucous and blood clots.  The drainage may be dark red or brown, though some fresh blood may be seen intermittently, especially after irrigation.  Do not blow you nose, as bleeding may occur. If you must sneeze, keep your mouth open to allow air to escape through your mouth.  If heavy bleeding occurs: Irrigate the nose with saline to rinse out clots, then spray the nose 3 - 4 times with Afrin Nasal Decongestant Spray.  The spray will constrict the blood vessels to slow bleeding.  Pinch the lower half of your nose shut to apply pressure, and lay down with your head elevated.  Ice packs over the nose may help as well. If bleeding persists despite these measures, you should notify your doctor.  Do not use the Afrin routinely to control nasal congestion after surgery, as it can result in worsening congestion and may affect healing.     Activity: Return to work varies among patients. Most patients will be out   of work at least 5 - 7 days to recover.  Patient may return to work after they are off of narcotic pain medication, and feeling well enough to perform the functions of their job.  Patients must avoid heavy lifting (over 10 pounds) or strenuous physical for 2 weeks after surgery, so your employer may need to assign you to light duty, or keep you out of work longer if light duty is not possible.  NOTE: you should not drive, operate dangerous machinery, do any mentally demanding tasks or make any important legal or financial decisions while on narcotic pain medication and recovering from the general anesthetic.    Call Your Doctor Immediately if You Have Any of the Following: Bleeding that you cannot control with the above measures Loss of vision, double vision, bulging of the eye or black eyes. Fever over 101 degrees Neck stiffness with severe headache,  fever, nausea and change in mental state. You are always encouraged to call anytime with concerns, however, please call with requests for pain medication refills during office hours.  Office Endoscopy: During follow-up visits your doctor will remove any packing or splints that may have been placed and evaluate and clean your sinuses endoscopically.  Topical anesthetic will be used to make this as comfortable as possible, though you may want to take your pain medication prior to the visit.  How often this will need to be done varies from patient to patient.  After complete recovery from the surgery, you may need follow-up endoscopy from time to time, particularly if there is concern of recurrent infection or nasal polyps.  

## 2022-12-02 NOTE — Progress Notes (Signed)
Safety precautions to be maintained throughout the outpatient stay will include: orient to surroundings, keep bed in low position, maintain call bell within reach at all times, provide assistance with transfer out of bed and ambulation.  

## 2022-12-02 NOTE — Patient Instructions (Signed)

## 2022-12-02 NOTE — Progress Notes (Addendum)
PROVIDER NOTE: Interpretation of information contained herein should be left to medically-trained personnel. Specific patient instructions are provided elsewhere under "Patient Instructions" section of medical record. This document was created in part using STT-dictation technology, any transcriptional errors that may result from this process are unintentional.  Patient: Caleb Edwards Type: Established DOB: 01/12/1951 MRN: [id number] PCP: Cherylann Parr, MD  Service: Procedure DOS: 12/02/2022 Setting: Ambulatory Location: Ambulatory outpatient facility Delivery: Face-to-face Provider: Gaspar Cola, MD Specialty: Interventional Pain Management Specialty designation: 09 Location: Outpatient facility Ref. Prov.: Milinda Pointer, MD       Interventional Therapy   Procedure No.1: Lumbar Facet, Medial Branch Block(s) #2  Laterality: Bilateral  Level: L3, L4, L5, and S1 Medial Branch Level(s). Injecting these levels blocks the L4-5 and L5-S1 lumbar facet joints. Imaging: Fluoroscopic guidance         Anesthesia: Local anesthesia (1-2% Lidocaine) Anxiolysis: None  Sedation: No Sedation                       DOS: 12/02/2022 Performed by: Gaspar Cola, MD  Primary Purpose: Diagnostic/Therapeutic Indications: Low back pain severe enough to impact quality of life or function. 1. Lumbar facet syndrome (Bilateral) (R>L)   2. Lumbar facet arthropathy   3. Spondylosis without myelopathy or radiculopathy, lumbosacral region   4. DDD (degenerative disc disease), lumbosacral   5. Grade 1 Anterolisthesis (7-59m) of L5 over S1   6. Chronic low back pain (1ry area of Pain) (Bilateral) (R>L) w/o sciatica   7. Lumbar facet joint pain    Procedure No.2: Sacrococcygeal Nerve Block (Caudal ESI)   #1  Laterality: Midline         Level: Sacrococcygeal ligament  Imaging: Fluoroscopy-guided         Anesthesia: Local anesthesia (1-2% Lidocaine) Anxiolysis: None                 Sedation: No  Sedation                       DOS: 12/02/2022  Performed by: FGaspar Cola MD  Purpose: Diagnostic/Therapeutic Indications: Low back and lower extremity pain severe enough to impact quality of life or function. Rationale (medical necessity): procedure needed and proper for the diagnosis and/or treatment of Mr. BTezakmedical symptoms and needs. 1. Sacrococcygeal pain   2. Coccygodynia    NAS-11 Pain score:   Pre-procedure: 10-Worst pain ever/10   Post-procedure: 4 /10    Note: The patient comes in today indicating a flareup of his low back pain as well as pain in the tailbone area.  He refers not really knowing what trigger that pain.  Today we will proceed as planned for the lumbar facet medial branch blocks.  He has a 7 to 9 mm anterolisthesis of L5 over S1.  Today we asked him to point out the upper limit of his low back pain.  We have marked that and we took an x-ray which demonstrates this area to encompass the L4-5 and L5-S1 facet joints.  In addition to blocking those 2 levels, I will be doing a diagnostic sacrococcygeal nerve block.  If for the duration of the local anesthetic he does not get relief of the pain in the area of the tailbone, this may signify displacement of his anterolisthesis with possible pressure over the nerves innervating the area of the sacrum and sacrococcygeal canal.  I will see the patient back in 2 weeks for  follow-up.  I have instructed the patient to keep track of his pain very carefully so that we can talk about the results on his postprocedure evaluation.   Position / Prep / Materials:  Position: Prone  Prep solution: DuraPrep (Iodine Povacrylex [0.7% available iodine] and Isopropyl Alcohol, 74% w/w) Area Prepped: Posterolateral Lumbosacral Spine (Wide prep: From the lower border of the scapula down to the end of the tailbone and from flank to flank.)  Materials:  Tray: Block Needle(s):  Type: Spinal  Gauge (G): 22  Length: 3.5-in Qty: 4       Pre-op H&P Assessment:  Caleb Edwards is a 72 y.o. (year old), male patient, seen today for interventional treatment. He  has a past surgical history that includes Esophagogastroduodenoscopy (egd) with propofol (N/A, 01/03/2016); Colonoscopy with propofol (N/A, 08/02/2017); Esophagogastroduodenoscopy (egd) with propofol (08/02/2017); Esophagogastroduodenoscopy (egd) with propofol (N/A, 02/05/2018); Colonoscopy with propofol (N/A, 02/05/2018); Esophagogastroduodenoscopy (egd) with propofol (N/A, 03/30/2018); Esophagogastroduodenoscopy (egd) with propofol (N/A, 08/09/2019); Colonoscopy with propofol (N/A, 08/09/2019); Givens capsule study (N/A, 04/24/2020); and Esophagogastroduodenoscopy (egd) with propofol (N/A, 04/24/2020). [name] has a current medication list which includes the following prescription(s): amlodipine, chlorthalidone, ferrous sulfate, ibuprofen, lisinopril, naloxone, pantoprazole, rosuvastatin, sildenafil, tramadol, and trazodone, and the following Facility-Administered Medications: fentanyl, lactated ringers, midazolam, and pentafluoroprop-tetrafluoroeth. His primarily concern today is the Back Pain  Initial Vital Signs:  Pulse/HCG Rate: 71  Temp: 97.9 F (36.6 C) Resp: 17 BP: 130/78 SpO2: 100 %  BMI: Estimated body mass index is 25.09 kg/m as calculated from the following:   Height as of this encounter: 6' (1.829 m).   Weight as of this encounter: 185 lb (83.9 kg).  Risk Assessment: Allergies: Reviewed. He has No Known Allergies.  Allergy Precautions: None required Coagulopathies: Reviewed. None identified.  Blood-thinner therapy: None at this time Active Infection(s): Reviewed. None identified. [name] is afebrile  Site Confirmation: Caleb Edwards was asked to confirm the procedure and laterality before marking the site Procedure checklist: Completed Consent: Before the procedure and under the influence of no sedative(s), amnesic(s), or anxiolytics, the patient was informed of  the treatment options, risks and possible complications. To fulfill our ethical and legal obligations, as recommended by the American Medical Association's Code of Ethics, I have informed the patient of my clinical impression; the nature and purpose of the treatment or procedure; the risks, benefits, and possible complications of the intervention; the alternatives, including doing nothing; the risk(s) and benefit(s) of the alternative treatment(s) or procedure(s); and the risk(s) and benefit(s) of doing nothing. The patient was provided information about the general risks and possible complications associated with the procedure. These may include, but are not limited to: failure to achieve desired goals, infection, bleeding, organ or nerve damage, allergic reactions, paralysis, and death. In addition, the patient was informed of those risks and complications associated to Spine-related procedures, such as failure to decrease pain; infection (i.e.: Meningitis, epidural or intraspinal abscess); bleeding (i.e.: epidural hematoma, subarachnoid hemorrhage, or any other type of intraspinal or peri-dural bleeding); organ or nerve damage (i.e.: Any type of peripheral nerve, nerve root, or spinal cord injury) with subsequent damage to sensory, motor, and/or autonomic systems, resulting in permanent pain, numbness, and/or weakness of one or several areas of the body; allergic reactions; (i.e.: anaphylactic reaction); and/or death. Furthermore, the patient was informed of those risks and complications associated with the medications. These include, but are not limited to: allergic reactions (i.e.: anaphylactic or anaphylactoid reaction(s)); adrenal axis suppression; blood sugar elevation that in  diabetics may result in ketoacidosis or comma; water retention that in patients with history of congestive heart failure may result in shortness of breath, pulmonary edema, and decompensation with resultant heart failure; weight  gain; swelling or edema; medication-induced neural toxicity; particulate matter embolism and blood vessel occlusion with resultant organ, and/or nervous system infarction; and/or aseptic necrosis of one or more joints. Finally, the patient was informed that Medicine is not an exact science; therefore, there is also the possibility of unforeseen or unpredictable risks and/or possible complications that may result in a catastrophic outcome. The patient indicated having understood very clearly. We have given the patient no guarantees and we have made no promises. Enough time was given to the patient to ask questions, all of which were answered to the patient's satisfaction. Mr. Euresti has indicated that he wanted to continue with the procedure. Attestation: I, the ordering provider, attest that I have discussed with the patient the benefits, risks, side-effects, alternatives, likelihood of achieving goals, and potential problems during recovery for the procedure that I have provided informed consent. Date  Time: 12/02/2022  9:19 AM   Pre-Procedure Preparation:  Monitoring: As per clinic protocol. Respiration, ETCO2, SpO2, BP, heart rate and rhythm monitor placed and checked for adequate function Safety Precautions: Patient was assessed for positional comfort and pressure points before starting the procedure. Time-out: I initiated and conducted the "Time-out" before starting the procedure, as per protocol. The patient was asked to participate by confirming the accuracy of the "Time Out" information. Verification of the correct person, site, and procedure were performed and confirmed by me, the nursing staff, and the patient. "Time-out" conducted as per Joint Commission's Universal Protocol (UP.01.01.01). Time: 1057  Description of Procedure #1:  Laterality: (see above) Targeted Levels: (see above)  Start Time: 1057 hrs.  Safety Precautions: Aspiration looking for blood return was conducted prior to all  injections. At no point did we inject any substances, as a needle was being advanced. Before injecting, the patient was told to immediately notify me if he was experiencing any new onset of "ringing in the ears, or metallic taste in the mouth". No attempts were made at seeking any paresthesias. Safe injection practices and needle disposal techniques used. Medications properly checked for expiration dates. SDV (single dose vial) medications used. After the completion of the procedure, all disposable equipment used was discarded in the proper designated medical waste containers. Local Anesthesia: Protocol guidelines were followed. The patient was positioned over the fluoroscopy table. The area was prepped in the usual manner. The time-out was completed. The target area was identified using fluoroscopy. A 12-in long, straight, sterile hemostat was used with fluoroscopic guidance to locate the targets for each level blocked. Once located, the skin was marked with an approved surgical skin marker. Once all sites were marked, the skin (epidermis, dermis, and hypodermis), as well as deeper tissues (fat, connective tissue and muscle) were infiltrated with a small amount of a short-acting local anesthetic, loaded on a 10cc syringe with a 25G, 1.5-in  Needle. An appropriate amount of time was allowed for local anesthetics to take effect before proceeding to the next step. Local Anesthetic: Lidocaine 2.0% The unused portion of the local anesthetic was discarded in the proper designated containers. Technical description of process:   L3 Medial Branch Nerve Block (MBB): The target area for the L3 medial branch is at the junction of the postero-lateral aspect of the superior articular process and the superior, posterior, and medial edge of the transverse process  of L4. Under fluoroscopic guidance, a Quincke needle was inserted until contact was made with os over the superior postero-lateral aspect of the pedicular shadow  (target area). After negative aspiration for blood, 0.5 mL of the nerve block solution was injected without difficulty or complication. The needle was removed intact. L4 Medial Branch Nerve Block (MBB): The target area for the L4 medial branch is at the junction of the postero-lateral aspect of the superior articular process and the superior, posterior, and medial edge of the transverse process of L5. Under fluoroscopic guidance, a Quincke needle was inserted until contact was made with os over the superior postero-lateral aspect of the pedicular shadow (target area). After negative aspiration for blood, 0.5 mL of the nerve block solution was injected without difficulty or complication. The needle was removed intact. L5 Medial Branch Nerve Block (MBB): The target area for the L5 medial branch is at the junction of the postero-lateral aspect of the superior articular process and the superior, posterior, and medial edge of the sacral ala. Under fluoroscopic guidance, a Quincke needle was inserted until contact was made with os over the superior postero-lateral aspect of the pedicular shadow (target area). After negative aspiration for blood, 0.5 mL of the nerve block solution was injected without difficulty or complication. The needle was removed intact. S1 Medial Branch Nerve Block (MBB): The target area for the S1 medial branch is at the posterior and inferior 6 o'clock position of the L5-S1 facet joint. Under fluoroscopic guidance, the Quincke needle inserted for the L5 MBB was redirected until contact was made with os over the inferior and postero aspect of the sacrum, at the 6 o' clock position under the L5-S1 facet joint (Target area). After negative aspiration for blood, 0.5 mL of the nerve block solution was injected without difficulty or complication. The needle was removed intact.  Once the entire procedure was completed, the treated area was cleaned, making sure to leave some of the prepping solution  back to take advantage of its long term bactericidal properties.         Illustration of the posterior view of the lumbar spine and the posterior neural structures. Laminae of L2 through S1 are labeled. DPRL5, dorsal primary ramus of L5; DPRS1, dorsal primary ramus of S1; DPR3, dorsal primary ramus of L3; FJ, facet (zygapophyseal) joint L3-L4; I, inferior articular process of L4; LB1, lateral branch of dorsal primary ramus of L1; IAB, inferior articular branches from L3 medial branch (supplies L4-L5 facet joint); IBP, intermediate branch plexus; MB3, medial branch of dorsal primary ramus of L3; NR3, third lumbar nerve root; S, superior articular process of L5; SAB, superior articular branches from L4 (supplies L4-5 facet joint also); TP3, transverse process of L3.  Description  Narrative of Procedure No.2:          Technical description of procedure:  Safety Precautions: Aspiration looking for blood return was conducted prior to all injections. At no point did we inject any substances, as a needle was being advanced. No attempts were made at seeking any paresthesias. Safe injection practices and needle disposal techniques used. Medications properly checked for expiration dates. SDV (single dose vial) medications used. Description of the Procedure: Protocol guidelines were followed. The patient was placed in position over the fluoroscopy table. The target area was identified and the area prepped in the usual manner. Skin & deeper tissues infiltrated with local anesthetic. Appropriate amount of time allowed to pass for local anesthetics to take effect. The procedure needle was  then advanced to the target area. Proper needle placement secured. Negative aspiration confirmed. Solution injected in intermittent fashion, asking for systemic symptoms every 0.5cc of injectate. The needle/catheter were removed and the area cleansed, making sure to leave some of the prepping solution back to take advantage of its  long term bactericidal properties.  Vitals:   12/02/22 0918 12/02/22 1055 12/02/22 1105 12/02/22 1112  BP: 130/78 (!) 155/90 (!) 164/95 (!) 158/97  Pulse: 71 66 71 73  Resp: 17 15 16 15  $ Temp: 97.9 F (36.6 C)     TempSrc: Temporal     SpO2: 100% 100% 100% 100%  Weight: 185 lb (83.9 kg)     Height: 6' (1.829 m)      End Time: 1109 hrs.  Imaging Guidance (Spinal) for procedure #1:  Type of Imaging Technique: Fluoroscopy Guidance (Spinal) Indication(s): Assistance in needle guidance and placement for procedures requiring needle placement in or near specific anatomical locations not easily accessible without such assistance. Exposure Time: Please see nurses notes. Contrast: None used. Fluoroscopic Guidance: I was personally present during the use of fluoroscopy. "Tunnel Vision Technique" used to obtain the best possible view of the target area. Parallax error corrected before commencing the procedure. "Direction-depth-direction" technique used to introduce the needle under continuous pulsed fluoroscopy. Once target was reached, antero-posterior, oblique, and lateral fluoroscopic projection used confirm needle placement in all planes. Images permanently stored in EMR. Interpretation: No contrast injected. I personally interpreted the imaging intraoperatively. Adequate needle placement confirmed in multiple planes. Permanent images saved into the patient's record.  Imaging Guidance (Spinal) for procedure #2:  Type of Imaging Technique: Fluoroscopy Guidance (Spinal) Indication(s): Assistance in needle guidance and placement for procedures requiring needle placement in or near specific anatomical locations not easily accessible without such assistance. Exposure Time: Please see nurses notes. Contrast: None used. Fluoroscopic Guidance: I was personally present during the use of fluoroscopy. "Tunnel Vision Technique" used to obtain the best possible view of the target area. Parallax error  corrected before commencing the procedure. "Direction-depth-direction" technique used to introduce the needle under continuous pulsed fluoroscopy. Once target was reached, antero-posterior, oblique, and lateral fluoroscopic projection used confirm needle placement in all planes. Images permanently stored in EMR. Interpretation: No contrast injected. I personally interpreted the imaging intraoperatively. Adequate needle placement confirmed in multiple planes. Permanent images saved into the patient's record.  Post-operative Assessment:  Post-procedure Vital Signs:  Pulse/HCG Rate: 73  Temp: 97.9 F (36.6 C) Resp: 15 BP: (!) 158/97 SpO2: 100 %  EBL: None  Complications: No immediate post-treatment complications observed by team, or reported by patient.  Note: The patient tolerated the entire procedure well. A repeat set of vitals were taken after the procedure and the patient was kept under observation following institutional policy, for this type of procedure. Post-procedural neurological assessment was performed, showing return to baseline, prior to discharge. The patient was provided with post-procedure discharge instructions, including a section on how to identify potential problems. Should any problems arise concerning this procedure, the patient was given instructions to immediately contact us, at any time, without hesitation. In any case, we plan to contact the patient by telephone for a follow-up status report regarding this interventional procedure.  Comments:  No additional relevant information.  Plan of Care (POC)  Orders:  Orders Placed This Encounter  Procedures   LUMBAR FACET(MEDIAL BRANCH NERVE BLOCK) MBNB    Scheduling Instructions:     Procedure: Lumbar facet block (AKA.: Lumbosacral medial branch nerve block)  Side: Bilateral     Level: L3-4, L4-5, and L5-S1 Facets (L2, L3, L4, L5, and S1 Medial Branch Nerves)     Sedation: Patient's choice.     Timeframe: Today     Order Specific Question:   Where will this procedure be performed?    Answer:   [facility]   DG PAIN [facility] C-ARM 1-60 MIN NO REPORT    Intraoperative interpretation by procedural physician at [place].    Standing Status:   Standing    Number of Occurrences:   1    Order Specific Question:   Reason for exam:    Answer:   Assistance in needle guidance and placement for procedures requiring needle placement in or near specific anatomical locations not easily accessible without such assistance.   Informed Consent Details: Physician/Practitioner Attestation; Transcribe to consent form and obtain patient signature    Nursing Order: Transcribe to consent form and obtain patient signature. Note: Always confirm laterality of pain with Mr. Sein, before procedure.    Order Specific Question:   Physician/Practitioner attestation of informed consent for procedure/surgical case    Answer:   I, the physician/practitioner, attest that I have discussed with the patient the benefits, risks, side effects, alternatives, likelihood of achieving goals and potential problems during recovery for the procedure that I have provided informed consent.    Order Specific Question:   Procedure    Answer:   Lumbar Facet Block  under fluoroscopic guidance    Order Specific Question:   Physician/Practitioner performing the procedure    Answer:   Marisa Hage A. Dossie Arbour MD    Order Specific Question:   Indication/Reason    Answer:   Low Back Pain, with our without leg pain, due to Facet Joint Arthralgia (Joint Pain) Spondylosis (Arthritis of the Spine), without myelopathy or radiculopathy (Nerve Damage).   Provide equipment / supplies at bedside    Procedure tray: "Block Tray" (Disposable  single use) Skin infiltration needle: Regular 1.5-in, 25-G, (x1) Block Needle type: Spinal Amount/quantity: 4 Size: Regular (3.5-inch) Gauge: 22G    Standing Status:   Standing    Number of Occurrences:   1     Order Specific Question:   Specify    Answer:   Block Tray   PT TENS evaluation    Scheduling Instructions:     Please set this patient up with a TENS unit to be used for acute and subacute flareups of hispain   Chronic Opioid Analgesic:  Tramadol 50 mg, 2 tabs PO q 6 hrs (400 mg/day of tramadol) MME/day: 40 mg/day.   Medications ordered for procedure: Meds ordered this encounter  Medications   lidocaine (XYLOCAINE) 2 % (with pres) injection 400 mg   pentafluoroprop-tetrafluoroeth (GEBAUERS) aerosol   lactated ringers infusion   midazolam (VERSED) 5 MG/5ML injection 0.5-2 mg    Make sure Flumazenil is available in the pyxis when using this medication. If oversedation occurs, administer 0.2 mg IV over 15 sec. If after 45 sec no response, administer 0.2 mg again over 1 min; may repeat at 1 min intervals; not to exceed 4 doses (1 mg)   fentaNYL (SUBLIMAZE) injection 25-50 mcg    Make sure Narcan is available in the pyxis when using this medication. In the event of respiratory depression (RR< 8/min): Titrate NARCAN (naloxone) in increments of 0.1 to 0.2 mg IV at 2-3 minute intervals, until desired degree of reversal.   ropivacaine (PF) 2 mg/mL (0.2%) (NAROPIN) injection 18 mL  triamcinolone acetonide (KENALOG-40) injection 80 mg   Medications administered: We administered lidocaine, ropivacaine (PF) 2 mg/mL (0.2%), and triamcinolone acetonide.  See the medical record for exact dosing, route, and time of administration.  Follow-up plan:   Return in about 2 weeks (around 12/16/2022) for Proc-day (T,Th), (Face2F), (PPE).       Interventional Therapies  Risk Factors  Considerations:   Stage3 CKD  HTN  Hx GI Bleed  Anemia w/ near syncope   Planned  Pending:   Diagnostic bilateral lumbar facet MBB #2     Under consideration:   Diagnostic right IA hip joint injection  Diagnostic bilateral lumbar facet block  Possible bilateral lumbar facet RFA    Completed:   Therapeutic midline  C6-7 interspinous ligament inj. x1 (06/28/2022) (80/90/75/75)  Diagnostic bilateral lumbar facet MBB x1 (06/30/2021) (100/100/100/100)  Therapeutic left cervical ESI x2 (02/05/2021) (100/100/70 x 10 days/50)  Therapeutic right cervical ESI x1 (12/15/2021) (90/90/90/90)  Therapeutic left L3-4 LESI x2 (06/13/2018) (100/100/85/85)  Therapeutic left L3 TFESI x1 (03/13/2020) (95/95/60/>50)  Therapeutic left L4 TFESI x2 (03/13/2020) (95/95/60/>50)  Therapeutic right L3 TFESI x2 (03/13/2020) (95/95/60/>50)  Therapeutic right L4 TFESI x3 (03/13/2020) (95/95/60/>50)  Referral to neurosurgery Elayne Guerin, MD) for cervical instability (11/11/2020)    Therapeutic  Palliative (PRN) options:   Palliative left L3-4 LESI   Palliative left L3 TFESI   Palliative left L4 TFESI   Palliative right L3 TFESI   Palliative right L4 TFESI         Recent Visits Date Type Provider Dept  11/22/22 Office Visit Milinda Pointer, MD [facility]  Showing recent visits within past 90 days and meeting all other requirements Today's Visits Date Type Provider Dept  12/02/22 Procedure visit Milinda Pointer, MD [facility]  Showing today's visits and meeting all other requirements Future Appointments Date Type Provider Dept  12/16/22 Appointment Milinda Pointer, MD [facility]  01/26/23 Appointment Milinda Pointer, MD [facility]  Showing future appointments within next 90 days and meeting all other requirements  Disposition: Discharge home  Discharge (Date  Time): 12/02/2022; 1117 hrs.   Primary Care Physician: Duffy, Feliz Beam, MD Location: [facility] Note by: Gaspar Cola, MD (TTS technology used. I apologize for any typographical errors that were not detected and corrected.) Date: 12/02/2022; Time: 11:39 AM  Disclaimer:  Medicine is not an Chief Strategy Officer. The only guarantee in medicine is that nothing is guaranteed. It is  important to note that the decision to proceed with this intervention was based on the information collected from the patient. The Data and conclusions were drawn from the patient's questionnaire, the interview, and the physical examination. Because the information was provided in large part by the patient, it cannot be guaranteed that it has not been purposely or unconsciously manipulated. Every effort has been made to obtain as much relevant data as possible for this evaluation. It is important to note that the conclusions that lead to this procedure are derived in large part from the available data. Always take into account that the treatment will also be dependent on availability of resources and existing treatment guidelines, considered by other Pain Management Practitioners as being common knowledge and practice, at the time of the intervention. For Medico-Legal purposes, it is also important to point out that variation in procedural techniques and pharmacological choices are the acceptable norm. The indications, contraindications, technique, and results of the above procedure should only be interpreted and judged by a  Board-Certified Interventional Pain Specialist with extensive familiarity and expertise in the same exact procedure and technique.

## 2022-12-03 ENCOUNTER — Telehealth: Payer: Self-pay

## 2022-12-03 NOTE — Telephone Encounter (Signed)
Post procedure follow up. LM 

## 2022-12-07 ENCOUNTER — Telehealth: Payer: Self-pay | Admitting: Pain Medicine

## 2022-12-07 DIAGNOSIS — G8929 Other chronic pain: Secondary | ICD-10-CM | POA: Insufficient documentation

## 2022-12-07 NOTE — Telephone Encounter (Signed)
Patient states he is having severe pain in tailbone area and down the left side

## 2022-12-07 NOTE — Progress Notes (Unsigned)
PROVIDER NOTE: Information contained herein reflects review and annotations entered in association with encounter. Interpretation of such information and data should be left to medically-trained personnel. Information provided to patient can be located elsewhere in the medical record under "Patient Instructions". Document created using STT-dictation technology, any transcriptional errors that may result from process are unintentional.    Patient: Caleb Edwards  Service Category: E/M  Provider: Gaspar Cola, MD  DOB: 1950/10/12  DOS: 12/08/2022  Referring Provider: Cherylann Parr, MD  MRN: [id number]  Specialty: Interventional Pain Management  PCP: Cherylann Parr, MD  Type: Established Patient  Setting: Ambulatory outpatient    Location: Office  Delivery: Face-to-face     HPI  Caleb Edwards, a 72 y.o. year old male, is here today because of his Chronic radicular lumbar pain [M54.16, G89.29]. Caleb Edwards primary complain today is No chief complaint on file. Last encounter: My last encounter with him was on 12/07/2022. 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); Chronic neck pain; Cervical spondylosis; Chronic pain syndrome; Abdominal pain; Grade 1 Anterolisthesis (7-52m) of L5 over S1; DDD (degenerative disc disease), lumbosacral; Lumbar facet arthropathy; Chronic hip pain (2ry area of Pain) (Bilateral) (R>L); Cervical radiculopathy (Left); Cervicalgia; DDD (degenerative disc disease), cervical; Abnormal x-ray of cervical spine; 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 Pain Assessment: Severity of   is reported as a  /10. Location:    / . Onset:  . Quality:  . Timing:  . Modifying factor(s):  .Marland KitchenVitals:  vitals were not taken for this visit.  BMI: Estimated body mass index is 25.09 kg/m as calculated from the following:   Height as of 12/02/22: 6' (1.829 m).   Weight as of 12/02/22: 185 lb (83.9 kg).  Reason for encounter: evaluation of worsening, or previously known (established) problem. ***   Post-procedure evaluation   Procedure No.1: Lumbar Facet, Medial Branch Block(s) #2  Laterality: Bilateral  Level: L3, L4, L5, and S1 Medial Branch Level(s). Injecting these levels blocks the L4-5 and L5-S1 lumbar facet joints. Imaging: Fluoroscopic guidance         Anesthesia: Local anesthesia (1-2% Lidocaine) Anxiolysis: None  Sedation: No Sedation                       DOS: 12/02/2022 Performed by: FGaspar Cola MD  Primary Purpose: Diagnostic/Therapeutic Indications: Low back pain severe enough to impact quality of life or function. 1. Lumbar facet syndrome (Bilateral) (R>L)   2. Lumbar facet arthropathy   3. Spondylosis without myelopathy or radiculopathy, lumbosacral region   4. DDD (degenerative disc disease), lumbosacral   5. Grade 1 Anterolisthesis (7-968m of L5 over S1   6. Chronic low back pain (1ry area of Pain) (Bilateral) (R>L) w/o sciatica   7. Lumbar facet joint pain  Procedure No.2: Sacrococcygeal Nerve Block (Caudal ESI)   #1  Laterality: Midline          Level: Sacrococcygeal ligament  Imaging: Fluoroscopy-guided         Anesthesia: Local anesthesia (1-2% Lidocaine) Anxiolysis: None                 Sedation: No Sedation                       DOS: 12/02/2022  Performed by: Gaspar Cola, MD  Purpose: Diagnostic/Therapeutic Indications: Low back and lower extremity pain severe enough to impact quality of life or function. Rationale (medical necessity): procedure needed and proper for the diagnosis and/or treatment of Caleb Edwards medical symptoms and needs. 1. Sacrococcygeal pain   2. Coccygodynia    NAS-11 Pain score:   Pre-procedure: 10-Worst pain ever/10   Post-procedure: 4 /10    Note: The patient comes in today indicating a flareup of his low back pain as well as pain in the tailbone area.  He refers not really knowing what trigger that pain.  Today we will proceed as planned for the lumbar facet medial branch blocks.  He has a 7 to 9 mm anterolisthesis of L5 over S1.  Today we asked him to point out the upper limit of his low back pain.  We have marked that and we took an x-ray which demonstrates this area to encompass the L4-5 and L5-S1 facet joints.  In addition to blocking those 2 levels, I will be doing a diagnostic sacrococcygeal nerve block.  If for the duration of the local anesthetic he does not get relief of the pain in the area of the tailbone, this may signify displacement of his anterolisthesis with possible pressure over the nerves innervating the area of the sacrum and sacrococcygeal canal.  I will see the patient back in 2 weeks for follow-up.  I have instructed the patient to keep track of his pain very carefully so that we can talk about the results on his postprocedure evaluation.    Effectiveness:  Initial hour after procedure:   ***. Subsequent 4-6 hours post-procedure:   ***. Analgesia past initial 6 hours:   ***. Ongoing improvement:  Analgesic:  *** Function:    ***    ROM:    ***       Pharmacotherapy Assessment  Analgesic: Tramadol 50 mg, 2 tabs PO q 6 hrs (400 mg/day of tramadol) MME/day: 40 mg/day.   Monitoring: [place] PMP: PDMP reviewed during this encounter.       Pharmacotherapy: No side-effects or adverse reactions reported. Compliance: No problems identified. Effectiveness: Clinically acceptable.  No notes on file  No results found for: "CBDTHCR" No results found for: "D8THCCBX" No results found for: "D9THCCBX"  UDS:  Summary  Date Value Ref Range Status  06/28/2022 Note  Final    Comment:    ==================================================================== [employer] Select 13 (MW) ==================================================================== Test                             Result       Flag       Units  Drug Present and Declared for Prescription Verification   Tramadol                       >11111       EXPECTED   ng/mg creat   O-Desmethyltramadol            >  11111       EXPECTED   ng/mg creat   N-Desmethyltramadol            2920         EXPECTED   ng/mg creat    Source of tramadol is a prescription medication. O-desmethyltramadol    and N-desmethyltramadol are expected metabolites of tramadol.  ==================================================================== Test                      Result    Flag   Units      Ref Range   Creatinine              45               mg/dL      >=20 ==================================================================== Declared Medications:  The flagging and interpretation on this report are based on the  following declared medications.  Unexpected results may arise from  inaccuracies in the declared medications.   **Note: The testing scope of this panel includes these medications:   Tramadol (Ultram)   **Note: The testing scope of this panel does not include the  following reported medications:   Amlodipine (Norvasc)  Chlorthalidone (Hygroton)  Ibuprofen (Advil)  Iron  Lisinopril (Zestril)   Pantoprazole (Protonix)  Rosuvastatin (Crestor)  Sildenafil (Viagra)  Trazodone (Desyrel) ==================================================================== For clinical consultation, please call [phone]. ====================================================================       ROS  Constitutional: Denies any fever or chills Gastrointestinal: No reported hemesis, hematochezia, vomiting, or acute GI distress Musculoskeletal: Denies any acute onset joint swelling, redness, loss of ROM, or weakness Neurological: No reported episodes of acute onset apraxia, aphasia, dysarthria, agnosia, amnesia, paralysis, loss of coordination, or loss of consciousness  Medication Review  amLODipine, chlorthalidone, ferrous sulfate, ibuprofen, lisinopril, naloxone, pantoprazole, rosuvastatin, sildenafil, traMADol, and traZODone  History Review  Allergy: Caleb Edwards has No Known Allergies. Drug: Caleb Edwards  reports no history of drug use. Alcohol:  reports current alcohol use of about 1.0 standard drink of alcohol per week. Tobacco:  reports that he has quit smoking. He has been exposed to tobacco smoke. He has never used smokeless tobacco. Social: Caleb Edwards  reports that he has quit smoking. He has been exposed to tobacco smoke. He has never used smokeless tobacco. He reports current alcohol use of about 1.0 standard drink of alcohol per week. He reports that he does not use drugs. Medical:  has a past medical history of Back pain, Blood transfusion without reported diagnosis (12/2015), Diverticulitis, Hypertension, Kidney stone, and Ulcer, stomach peptic, chronic. Surgical: Caleb Edwards  has a past surgical history that includes Esophagogastroduodenoscopy (egd) with propofol (N/A, 01/03/2016); Colonoscopy with propofol (N/A, 08/02/2017); Esophagogastroduodenoscopy (egd) with propofol (08/02/2017); Esophagogastroduodenoscopy (egd) with propofol (N/A, 02/05/2018); Colonoscopy with propofol (N/A, 02/05/2018);  Esophagogastroduodenoscopy (egd) with propofol (N/A, 03/30/2018); Esophagogastroduodenoscopy (egd) with propofol (N/A, 08/09/2019); Colonoscopy with propofol (N/A, 08/09/2019); Givens capsule study (N/A, 04/24/2020); and Esophagogastroduodenoscopy (egd) with propofol (N/A, 04/24/2020). Family: family history includes Diabetes in his mother; Heart disease in his father; Prostate cancer in his brother.  Laboratory Chemistry Profile   Renal Lab Results  Component Value Date   BUN 18 02/21/2020   CREATININE 1.24 02/21/2020   GFRAA >60 02/21/2020   GFRNONAA 60 (L) 02/21/2020    Hepatic Lab Results  Component Value Date   AST 23 08/09/2019   ALT 15 08/09/2019   ALBUMIN 3.8 08/09/2019   ALKPHOS 53 08/09/2019   LIPASE 17 08/08/2019    Electrolytes Lab  Results  Component Value Date   NA 138 08/09/2019   K 3.9 08/09/2019   CL 106 08/09/2019   CALCIUM 8.7 (L) 08/09/2019   MG 1.9 01/04/2016    Bone Lab Results  Component Value Date   VD25OH 24.0 (L) 12/26/2015    Inflammation (CRP: Acute Phase) (ESR: Chronic Phase) Lab Results  Component Value Date   CRP 0.6 12/26/2015   ESRSEDRATE 3 12/26/2015         Note: Above Lab results reviewed.  Recent Imaging Review  DG PAIN CLINIC C-ARM 1-60 MIN NO REPORT Fluoro was used, but no Radiologist interpretation will be provided.  Please refer to "NOTES" tab for provider progress note. Note: Reviewed        Physical Exam  General appearance: Well nourished, well developed, and well hydrated. In no apparent acute distress Mental status: Alert, oriented x 3 (person, place, & time)       Respiratory: No evidence of acute respiratory distress Eyes: PERLA Vitals: There were no vitals taken for this visit. BMI: Estimated body mass index is 25.09 kg/m as calculated from the following:   Height as of 12/02/22: 6' (1.829 m).   Weight as of 12/02/22: 185 lb (83.9 kg). Ideal: Ideal body weight: 77.6 kg (171 lb 1.2 oz) Adjusted ideal body weight:  80.1 kg (176 lb 10.3 oz)  Assessment   Diagnosis Status  1. 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 Chronic low back pain (Bilateral) w/ sciatica (Left)   3. Chronic lower extremity pain (3ry area of Pain) (Bilateral) (R>L)   4. Chronic hip pain (2ry area of Pain) (Bilateral) (R>L)   5. DDD (degenerative disc disease), lumbosacral   6. Grade 1 Anterolisthesis (7-52m) of L5 over S1   7. L4-5 disc bulge   8. Lumbar facet arthropathy   9. Lumbar facet joint pain   10. Lumbar facet syndrome (Bilateral) (R>L)   11. Lumbar foraminal stenosis (Bilateral) (L4-5) (L>R)   12. Lumbar spondylosis    Controlled Controlled Controlled   Updated Problems: Problem  Chronic low back pain (Bilateral) w/ sciatica (Left)    Plan of Care  Problem-specific:  No problem-specific Assessment & Plan notes found for this encounter.  Mr. WFARSHAD WOLINSKIhas a current medication list which includes the following long-term medication(s): chlorthalidone, lisinopril, pantoprazole, rosuvastatin, tramadol, and trazodone.  Pharmacotherapy (Medications Ordered): No orders of the defined types were placed in this encounter.  Orders:  No orders of the defined types were placed in this encounter.  Follow-up plan:   No follow-ups on file.      Interventional Therapies  Risk Factors  Considerations:   Stage3 CKD  HTN  Hx GI Bleed  Anemia w/ near syncope   Planned  Pending:   Diagnostic bilateral lumbar facet MBB #2     Under consideration:   Diagnostic right IA hip joint injection  Diagnostic bilateral lumbar facet block  Possible bilateral lumbar facet RFA    Completed:   Therapeutic midline C6-7 interspinous ligament inj. x1 (06/28/2022) (80/90/75/75)  Diagnostic bilateral lumbar facet MBB x1 (06/30/2021) (100/100/100/100)  Therapeutic left cervical ESI x2 (02/05/2021) (100/100/70 x 10 days/50)  Therapeutic right cervical ESI x1 (12/15/2021) (90/90/90/90)  Therapeutic left L3-4  LESI x2 (06/13/2018) (100/100/85/85)  Therapeutic left L3 TFESI x1 (03/13/2020) (95/95/60/>50)  Therapeutic left L4 TFESI x2 (03/13/2020) (95/95/60/>50)  Therapeutic right L3 TFESI x2 (03/13/2020) (95/95/60/>50)  Therapeutic right L4 TFESI x3 (03/13/2020) (95/95/60/>50)  Referral to neurosurgery (Elayne Guerin MD)  for cervical instability (11/11/2020)    Therapeutic  Palliative (PRN) options:   Palliative left L3-4 LESI   Palliative left L3 TFESI   Palliative left L4 TFESI   Palliative right L3 TFESI   Palliative right L4 TFESI          Recent Visits Date Type Provider Dept  12/02/22 Procedure visit Milinda Pointer, MD [facility]  11/22/22 Office Visit Milinda Pointer, MD [facility]  Showing recent visits within past 90 days and meeting all other requirements Future Appointments Date Type Provider Dept  12/08/22 Appointment Milinda Pointer, [facility]  12/16/22 Appointment Milinda Pointer, [facility]  01/26/23 Appointment Milinda Pointer, MD [facility]  Showing future appointments within next 90 days and meeting all other requirements  I discussed the assessment and treatment plan with the patient. The patient was provided an opportunity to ask questions and all were answered. The patient agreed with the plan and demonstrated an understanding of the instructions.  Patient advised to call back or seek an in-person evaluation if the symptoms or condition worsens.  Duration of encounter: *** minutes.  Total time on encounter, as per AMA guidelines included both the face-to-face and non-face-to-face time personally spent by the physician and/or other qualified health care professional(s) on the day of the encounter (includes time in activities that require the physician or other qualified health care professional and does not include time in activities normally performed by clinical staff). Physician's time  may include the following activities when performed: Preparing to see the patient (e.g., pre-charting review of records, searching for previously ordered imaging, lab work, and nerve conduction tests) Review of prior analgesic pharmacotherapies. Reviewing PMP Interpreting ordered tests (e.g., lab work, imaging, nerve conduction tests) Performing post-procedure evaluations, including interpretation of diagnostic procedures Obtaining and/or reviewing separately obtained history Performing a medically appropriate examination and/or evaluation Counseling and educating the patient/family/caregiver Ordering medications, tests, or procedures Referring and communicating with other health care professionals (when not separately reported) Documenting clinical information in the electronic or other health record Independently interpreting results (not separately reported) and communicating results to the patient/ family/caregiver Care coordination (not separately reported)  Note by: Gaspar Cola, MD Date: 12/08/2022; Time: 4:22 PM

## 2022-12-07 NOTE — Telephone Encounter (Signed)
Spoke with patient.  He states that he is having pain in the tailbone and to the left of the tailbone.  States he can hardly walk.  Had facet block done on 12/02/22. States that the pain went away that day then returned the next day.  Informed patient that the numbing medicaine had worn off and the pain returned and now waiting on the steroid to work.  Patient states he feels weakness and is hot/cold.  Denies taking his temperature.  States pain is a 10/10 and it is a nagging pain which worsens with movement.  Will notify Dr Dossie Arbour of patient complaint.

## 2022-12-08 ENCOUNTER — Encounter: Payer: Self-pay | Admitting: Pain Medicine

## 2022-12-08 ENCOUNTER — Ambulatory Visit (HOSPITAL_BASED_OUTPATIENT_CLINIC_OR_DEPARTMENT_OTHER): Payer: Medicare HMO | Admitting: Pain Medicine

## 2022-12-08 ENCOUNTER — Ambulatory Visit
Admission: RE | Admit: 2022-12-08 | Discharge: 2022-12-08 | Disposition: A | Discharge status: Home / Self Care | Payer: Medicare HMO | Source: Ambulatory Visit | Attending: Pain Medicine | Admitting: Pain Medicine

## 2022-12-08 ENCOUNTER — Telehealth: Payer: Self-pay | Admitting: Pain Medicine

## 2022-12-08 VITALS — BP 155/71 | HR 86 | Temp 97.3°F | Ht 72.0 in | Wt 185.0 lb

## 2022-12-08 DIAGNOSIS — M48061 Spinal stenosis, lumbar region without neurogenic claudication: Secondary | ICD-10-CM

## 2022-12-08 DIAGNOSIS — M51379 Other intervertebral disc degeneration, lumbosacral region without mention of lumbar back pain or lower extremity pain: Secondary | ICD-10-CM

## 2022-12-08 DIAGNOSIS — M545 Low back pain, unspecified: Secondary | ICD-10-CM | POA: Insufficient documentation

## 2022-12-08 DIAGNOSIS — M5136 Other intervertebral disc degeneration, lumbar region: Secondary | ICD-10-CM | POA: Insufficient documentation

## 2022-12-08 DIAGNOSIS — M4310 Spondylolisthesis, site unspecified: Secondary | ICD-10-CM | POA: Insufficient documentation

## 2022-12-08 DIAGNOSIS — M5137 Other intervertebral disc degeneration, lumbosacral region: Secondary | ICD-10-CM | POA: Insufficient documentation

## 2022-12-08 DIAGNOSIS — M79604 Pain in right leg: Secondary | ICD-10-CM

## 2022-12-08 DIAGNOSIS — M25551 Pain in right hip: Secondary | ICD-10-CM | POA: Insufficient documentation

## 2022-12-08 DIAGNOSIS — M5442 Lumbago with sciatica, left side: Secondary | ICD-10-CM

## 2022-12-08 DIAGNOSIS — M4317 Spondylolisthesis, lumbosacral region: Secondary | ICD-10-CM

## 2022-12-08 DIAGNOSIS — M5459 Other low back pain: Secondary | ICD-10-CM

## 2022-12-08 DIAGNOSIS — M79605 Pain in left leg: Secondary | ICD-10-CM | POA: Insufficient documentation

## 2022-12-08 DIAGNOSIS — M47816 Spondylosis without myelopathy or radiculopathy, lumbar region: Secondary | ICD-10-CM | POA: Insufficient documentation

## 2022-12-08 DIAGNOSIS — G8929 Other chronic pain: Secondary | ICD-10-CM

## 2022-12-08 DIAGNOSIS — M5416 Radiculopathy, lumbar region: Secondary | ICD-10-CM | POA: Insufficient documentation

## 2022-12-08 DIAGNOSIS — M51369 Other intervertebral disc degeneration, lumbar region without mention of lumbar back pain or lower extremity pain: Secondary | ICD-10-CM

## 2022-12-08 DIAGNOSIS — M25552 Pain in left hip: Secondary | ICD-10-CM

## 2022-12-08 MED ORDER — METHOCARBAMOL 1000 MG/10ML IJ SOLN
INTRAMUSCULAR | Status: AC
  Filled 2022-12-08: qty 10

## 2022-12-08 MED ORDER — METHOCARBAMOL 1000 MG/10ML IJ SOLN
200.0000 mg | Freq: Once | INTRAMUSCULAR | Status: AC
  Administered 2022-12-08: 200 mg via INTRAMUSCULAR

## 2022-12-08 MED ORDER — KETOROLAC TROMETHAMINE 60 MG/2ML IM SOLN
60.0000 mg | Freq: Once | INTRAMUSCULAR | Status: AC
  Administered 2022-12-08: 60 mg via INTRAMUSCULAR

## 2022-12-08 MED ORDER — OXYCODONE-ACETAMINOPHEN 5-325 MG PO TABS: 1.0000 | ORAL_TABLET | Freq: Four times a day (QID) | ORAL | 0 refills | Status: AC | PRN

## 2022-12-08 MED ORDER — KETOROLAC TROMETHAMINE 60 MG/2ML IM SOLN
INTRAMUSCULAR | Status: AC
  Filled 2022-12-08: qty 2

## 2022-12-08 NOTE — Telephone Encounter (Signed)
PT will be having a procedure done on 12-10-22 with another doctor. PT needs to come by on tomorrow to pick up form, that states it will be okay for him to get medications. Please give patient a call. TY

## 2022-12-08 NOTE — Progress Notes (Signed)
Safety precautions to be maintained throughout the outpatient stay will include: orient to surroundings, keep bed in low position, maintain call bell within reach at all times, provide assistance with transfer out of bed and ambulation.  

## 2022-12-08 NOTE — Patient Instructions (Signed)

## 2022-12-09 NOTE — Telephone Encounter (Signed)
Patient informed that the Surgeon's Letter will be at the front desk.

## 2022-12-10 ENCOUNTER — Ambulatory Visit
Admission: RE | Admit: 2022-12-10 | Discharge: 2022-12-10 | Disposition: A | Discharge status: Home / Self Care | Payer: Medicare HMO | Source: Ambulatory Visit | Attending: Unknown Physician Specialty | Admitting: Unknown Physician Specialty

## 2022-12-10 ENCOUNTER — Ambulatory Visit: Payer: Medicare HMO | Admitting: Anesthesiology

## 2022-12-10 ENCOUNTER — Encounter
Admission: RE | Disposition: A | Discharge status: Home / Self Care | Payer: Self-pay | Source: Ambulatory Visit | Attending: Unknown Physician Specialty

## 2022-12-10 ENCOUNTER — Encounter: Payer: Self-pay | Admitting: Unknown Physician Specialty

## 2022-12-10 ENCOUNTER — Other Ambulatory Visit: Payer: Self-pay

## 2022-12-10 DIAGNOSIS — R42 Dizziness and giddiness: Secondary | ICD-10-CM | POA: Diagnosis not present

## 2022-12-10 DIAGNOSIS — G4730 Sleep apnea, unspecified: Secondary | ICD-10-CM | POA: Diagnosis not present

## 2022-12-10 DIAGNOSIS — J320 Chronic maxillary sinusitis: Secondary | ICD-10-CM | POA: Insufficient documentation

## 2022-12-10 DIAGNOSIS — B9689 Other specified bacterial agents as the cause of diseases classified elsewhere: Secondary | ICD-10-CM | POA: Diagnosis not present

## 2022-12-10 DIAGNOSIS — Z87891 Personal history of nicotine dependence: Secondary | ICD-10-CM | POA: Diagnosis not present

## 2022-12-10 HISTORY — PX: MAXILLARY ANTROSTOMY: SHX2003

## 2022-12-10 SURGERY — MAXILLARY ANTROSTOMY
Anesthesia: General | Site: Nose | Laterality: Left

## 2022-12-10 MED ORDER — OXYCODONE HCL 5 MG PO TABS: 5.0000 mg | ORAL_TABLET | Freq: Once | ORAL | Status: AC | PRN

## 2022-12-10 MED ORDER — FENTANYL CITRATE PF 50 MCG/ML IJ SOSY
25.0000 ug | PREFILLED_SYRINGE | INTRAMUSCULAR | Status: DC | PRN
  Administered 2022-12-10 (×2): 25 ug via INTRAVENOUS

## 2022-12-10 MED ORDER — OXYCODONE HCL 5 MG/5ML PO SOLN
5.0000 mg | Freq: Once | ORAL | Status: AC | PRN
  Administered 2022-12-10: 5 mg via ORAL

## 2022-12-10 MED ORDER — OXYMETAZOLINE HCL 0.05 % NA SOLN
6.0000 | Freq: Once | NASAL | Status: AC
  Administered 2022-12-10: 6 via NASAL

## 2022-12-10 MED ORDER — MIDAZOLAM HCL 5 MG/5ML IJ SOLN
INTRAMUSCULAR | Status: DC | PRN
  Administered 2022-12-10: 2 mg via INTRAVENOUS

## 2022-12-10 MED ORDER — PROPOFOL 10 MG/ML IV BOLUS
INTRAVENOUS | Status: DC | PRN
  Administered 2022-12-10: 150 mg via INTRAVENOUS

## 2022-12-10 MED ORDER — DEXAMETHASONE SODIUM PHOSPHATE 4 MG/ML IJ SOLN
INTRAMUSCULAR | Status: DC | PRN
  Administered 2022-12-10: 8 mg via INTRAVENOUS

## 2022-12-10 MED ORDER — SUCCINYLCHOLINE CHLORIDE 200 MG/10ML IV SOSY
PREFILLED_SYRINGE | INTRAVENOUS | Status: DC | PRN
  Administered 2022-12-10: 100 mg via INTRAVENOUS

## 2022-12-10 MED ORDER — PHENYLEPHRINE HCL 0.5 % NA SOLN
NASAL | Status: DC | PRN
  Administered 2022-12-10: 15 mL via TOPICAL

## 2022-12-10 MED ORDER — FENTANYL CITRATE (PF) 100 MCG/2ML IJ SOLN
INTRAMUSCULAR | Status: DC | PRN
  Administered 2022-12-10 (×2): 25 ug via INTRAVENOUS
  Administered 2022-12-10: 50 ug via INTRAVENOUS

## 2022-12-10 MED ORDER — ONDANSETRON HCL 4 MG/2ML IJ SOLN
INTRAMUSCULAR | Status: DC | PRN
  Administered 2022-12-10: 4 mg via INTRAVENOUS

## 2022-12-10 MED ORDER — LACTATED RINGERS IV SOLN: INTRAVENOUS | Status: DC

## 2022-12-10 SURGICAL SUPPLY — 18 items
CANISTER SUCT 1200ML W/VALVE (MISCELLANEOUS) ×1 IMPLANT
CUP MEDICINE 2OZ PLAST GRAD ST (MISCELLANEOUS) ×1 IMPLANT
DRAPE HEAD BAR (DRAPES) ×1 IMPLANT
GLOVE SURG ENC TEXT LTX SZ7.5 (GLOVE) ×2 IMPLANT
HANDLE YANKAUER SUCT BULB TIP (MISCELLANEOUS) ×1 IMPLANT
KIT TURNOVER KIT A (KITS) ×1 IMPLANT
NDL HYPO 25GX1X1/2 BEV (NEEDLE) ×1 IMPLANT
NEEDLE HYPO 25GX1X1/2 BEV (NEEDLE) ×1 IMPLANT
NS IRRIG 500ML POUR BTL (IV SOLUTION) ×1 IMPLANT
PACK ENT CUSTOM (PACKS) ×1 IMPLANT
SOL ANTI-FOG 6CC FOG-OUT (MISCELLANEOUS) ×1 IMPLANT
SPONGE NEURO XRAY DETECT 1X3 (DISPOSABLE) ×1 IMPLANT
STRAP BODY AND KNEE 60X3 (MISCELLANEOUS) ×1 IMPLANT
SUCTION COAG ELEC 10 HAND CTRL (ELECTROSURGICAL) IMPLANT
SYR 10ML LL (SYRINGE) ×1 IMPLANT
TOWEL OR 17X26 4PK STRL BLUE (TOWEL DISPOSABLE) ×1 IMPLANT
TRAP SPECIMEN MUCUS 40CC (MISCELLANEOUS) IMPLANT
WATER STERILE IRR 500ML POUR (IV SOLUTION) ×1 IMPLANT

## 2022-12-10 NOTE — Op Note (Signed)
12/10/2022  11:24 AM    Caleb Edwards  [id number]   Pre-Op Dx: Sinusitis, chronic maxillary  Post-op Dx: SAME  Proc: Left endoscopic maxillary antrostomy with removal of tissue, endoscopic culture left maxillary sinus  Surg:  Roena Malady  Anes:  GOT  EBL: Less than 10 cc  Comp: None  Findings: Large crust filling the left maxillary sinus with thickened mucosa  Procedure: [name] was identified in the holding area taken the operating placed in supine position  General and tracheal anesthesia tube was turned 90 degrees, the patient was draped in usual fashion for endoscopic sinus surgery.  Cottonoid pledgets with phenylephrine lidocaine solution were placed within each nostril lasting for approximately 5 minutes.  These were then removed.  0 degree endoscope was then brought into the field examination of the right nostril showed no evidence of pathology.  On the left-hand side there was a large crust filling the ostiomeatal region.  This was photo documented.  The crust was removed and sent for culture.  There was pus behind this filling the sinus which was caught in a suction trap and also sent for culture.  The uncinate process had previously been eroded Ultiva biopsies were taken from the uncinate region and the side-biting forceps were used to open the ostiomeatal area widely.  Of excellent opening to the maxillary sinus.  With no active bleeding the ostiomeatal unit on the left-hand side was filled with Stammberger gel.  Patient was then returned to anesthesia where he was awakened in the operating room taken to cover him in stable condition.    Specimen a: Left maxillary sinus biopsies  Culture: 3 different cultures of the maxillary sinus on the left for AFB fungus and routine bacteria  Dispo:   Good  Plan: Charged home follow-up 2 weeks, we will send a prescription for irrigation him to pick up to begin tomorrow.  Roena Malady  12/10/2022 11:24 AM

## 2022-12-10 NOTE — Anesthesia Preprocedure Evaluation (Signed)
Anesthesia Evaluation  Patient identified by MRN, date of birth, ID band Patient awake    Reviewed: Allergy & Precautions, NPO status , Patient's Chart, lab work & pertinent test results, reviewed documented beta blocker date and time   History of Anesthesia Complications Negative for: history of anesthetic complications  Airway Mallampati: II  TM Distance: >3 FB Neck ROM: Full    Dental  (+) Upper Dentures, Lower Dentures, Poor Dentition, Dental Advidsory Given   Pulmonary neg pulmonary ROS, neg shortness of breath, neg COPD, former smoker   Pulmonary exam normal breath sounds clear to auscultation       Cardiovascular Exercise Tolerance: Good hypertension, Pt. on medications (-) angina (-) CAD, (-) Past MI, (-) Cardiac Stents and (-) CABG (-) dysrhythmias (-) Valvular Problems/Murmurs Rhythm:Regular Rate:Normal - Systolic murmurs    Neuro/Psych neg Seizures  Neuromuscular disease  negative psych ROS   GI/Hepatic Neg liver ROS, PUD,GERD  Medicated,,  Endo/Other  negative endocrine ROS    Renal/GU CRFRenal disease     Musculoskeletal  (+) Arthritis ,    Abdominal   Peds  Hematology  (+) Blood dyscrasia, anemia   Anesthesia Other Findings Past Medical History: No date: Back pain 12/2015: Blood transfusion without reported diagnosis     Comment:  bleeding ulcer hospitalization.  No date: Diverticulitis No date: Hypertension No date: Ulcer, stomach peptic, chronic   Reproductive/Obstetrics                             Anesthesia Physical Anesthesia Plan  ASA: 2  Anesthesia Plan: General ETT   Post-op Pain Management:    Induction: Intravenous  PONV Risk Score and Plan: 2 and Propofol infusion and TIVA  Airway Management Planned: Oral ETT  Additional Equipment: None  Intra-op Plan:   Post-operative Plan: Extubation in OR  Informed Consent: I have reviewed the patients  History and Physical, chart, labs and discussed the procedure including the risks, benefits and alternatives for the proposed anesthesia with the patient or authorized representative who has indicated his/her understanding and acceptance.     Dental Advisory Given  Plan Discussed with: Anesthesiologist, CRNA and Surgeon  Anesthesia Plan Comments: (Patient consented for risks of anesthesia including but not limited to:  - adverse reactions to medications - damage to eyes, teeth, lips or other oral mucosa - nerve damage due to positioning  - sore throat or hoarseness - Damage to heart, brain, nerves, lungs, other parts of body or loss of life  Patient voiced understanding.)        Anesthesia Quick Evaluation

## 2022-12-10 NOTE — Transfer of Care (Signed)
Immediate Anesthesia Transfer of Care Note  Patient: Caleb Edwards  Procedure(s) Performed: MAXILLARY ANTROSTOMY WITH TISSUE REMOVAL (Left: Nose)  Patient Location: PACU  Anesthesia Type: General ETT  Level of Consciousness: awake, alert  and patient cooperative  Airway and Oxygen Therapy: Patient Spontanous Breathing and Patient connected to supplemental oxygen  Post-op Assessment: Post-op Vital signs reviewed, Patient's Cardiovascular Status Stable, Respiratory Function Stable, Patent Airway and No signs of Nausea or vomiting  Post-op Vital Signs: Reviewed and stable  Complications: No notable events documented.

## 2022-12-10 NOTE — Anesthesia Procedure Notes (Signed)
Procedure Name: Intubation Date/Time: 12/10/2022 10:55 AM  Performed by: Tobie Poet, CRNAPre-anesthesia Checklist: Patient identified, Emergency Drugs available, Suction available and Patient being monitored Patient Re-evaluated:Patient Re-evaluated prior to induction Oxygen Delivery Method: Circle system utilized Preoxygenation: Pre-oxygenation with 100% oxygen Induction Type: IV induction Ventilation: Mask ventilation without difficulty Laryngoscope Size: Mac and 3 Grade View: Grade II Tube type: Oral Rae Tube size: 7.5 mm Number of attempts: 1 Airway Equipment and Method: Oral airway Placement Confirmation: ETT inserted through vocal cords under direct vision, positive ETCO2 and breath sounds checked- equal and bilateral Tube secured with: Tape Dental Injury: Teeth and Oropharynx as per pre-operative assessment

## 2022-12-10 NOTE — Anesthesia Postprocedure Evaluation (Signed)
Anesthesia Post Note  Patient: Caleb Edwards  Procedure(s) Performed: MAXILLARY ANTROSTOMY WITH TISSUE REMOVAL (Left: Nose)  Patient location during evaluation: PACU Anesthesia Type: General Level of consciousness: awake and alert Pain management: pain level controlled Vital Signs Assessment: post-procedure vital signs reviewed and stable Respiratory status: spontaneous breathing, nonlabored ventilation, respiratory function stable and patient connected to nasal cannula oxygen Cardiovascular status: blood pressure returned to baseline and stable Postop Assessment: no apparent nausea or vomiting Anesthetic complications: no  No notable events documented.   Last Vitals:  Vitals:   12/10/22 1151 12/10/22 1200  BP:  (!) 155/85  Pulse: (!) 53 60  Resp: 12 11  Temp:    SpO2: 100% 100%    Last Pain:  Vitals:   12/10/22 1200  TempSrc:   PainSc: [place]

## 2022-12-10 NOTE — H&P (Signed)
The patient's history has been reviewed, patient examined, no change in status, stable for surgery.  Questions were answered to the patients satisfaction.  

## 2022-12-13 ENCOUNTER — Encounter: Payer: Self-pay | Admitting: Unknown Physician Specialty

## 2022-12-13 LAB — SURGICAL PATHOLOGY

## 2022-12-14 ENCOUNTER — Ambulatory Visit
Admission: RE | Admit: 2022-12-14 | Discharge: 2022-12-14 | Disposition: A | Discharge status: Home / Self Care | Payer: Medicare HMO | Source: Ambulatory Visit | Attending: Pain Medicine | Admitting: Pain Medicine

## 2022-12-14 DIAGNOSIS — M4310 Spondylolisthesis, site unspecified: Secondary | ICD-10-CM | POA: Insufficient documentation

## 2022-12-14 DIAGNOSIS — G8929 Other chronic pain: Secondary | ICD-10-CM | POA: Diagnosis present

## 2022-12-14 DIAGNOSIS — M79604 Pain in right leg: Secondary | ICD-10-CM | POA: Diagnosis present

## 2022-12-14 DIAGNOSIS — M25551 Pain in right hip: Secondary | ICD-10-CM | POA: Insufficient documentation

## 2022-12-14 DIAGNOSIS — M5137 Other intervertebral disc degeneration, lumbosacral region: Secondary | ICD-10-CM | POA: Diagnosis present

## 2022-12-14 DIAGNOSIS — M79605 Pain in left leg: Secondary | ICD-10-CM | POA: Diagnosis present

## 2022-12-14 DIAGNOSIS — M5136 Other intervertebral disc degeneration, lumbar region: Secondary | ICD-10-CM | POA: Diagnosis present

## 2022-12-14 DIAGNOSIS — M47816 Spondylosis without myelopathy or radiculopathy, lumbar region: Secondary | ICD-10-CM | POA: Diagnosis present

## 2022-12-14 DIAGNOSIS — M5416 Radiculopathy, lumbar region: Secondary | ICD-10-CM | POA: Diagnosis present

## 2022-12-14 DIAGNOSIS — M48061 Spinal stenosis, lumbar region without neurogenic claudication: Secondary | ICD-10-CM | POA: Insufficient documentation

## 2022-12-14 DIAGNOSIS — M25552 Pain in left hip: Secondary | ICD-10-CM | POA: Insufficient documentation

## 2022-12-14 DIAGNOSIS — M5442 Lumbago with sciatica, left side: Secondary | ICD-10-CM | POA: Insufficient documentation

## 2022-12-14 LAB — ACID FAST SMEAR (AFB, MYCOBACTERIA): Acid Fast Smear: NEGATIVE

## 2022-12-15 ENCOUNTER — Ambulatory Visit (INDEPENDENT_AMBULATORY_CARE_PROVIDER_SITE_OTHER): Payer: Medicare HMO | Admitting: Surgical

## 2022-12-15 DIAGNOSIS — M48061 Spinal stenosis, lumbar region without neurogenic claudication: Secondary | ICD-10-CM

## 2022-12-15 LAB — AEROBIC/ANAEROBIC CULTURE W GRAM STAIN (SURGICAL/DEEP WOUND): Gram Stain: NONE SEEN

## 2022-12-16 ENCOUNTER — Observation Stay: Payer: Medicare HMO

## 2022-12-16 ENCOUNTER — Ambulatory Visit: Payer: Medicare HMO | Admitting: Pain Medicine

## 2022-12-16 ENCOUNTER — Encounter: Payer: Self-pay | Admitting: Surgical

## 2022-12-16 ENCOUNTER — Telehealth: Payer: Self-pay | Admitting: Surgical

## 2022-12-16 ENCOUNTER — Other Ambulatory Visit: Payer: Self-pay | Admitting: Surgical

## 2022-12-16 MED ORDER — GABAPENTIN 300 MG PO CAPS: 300.0000 mg | ORAL_CAPSULE | Freq: Three times a day (TID) | ORAL | 0 refills | Status: DC

## 2022-12-16 MED ORDER — OXYCODONE HCL 5 MG PO CAPS: 5.0000 mg | ORAL_CAPSULE | Freq: Four times a day (QID) | ORAL | 0 refills | Status: DC | PRN

## 2022-12-16 NOTE — Telephone Encounter (Signed)
Notified patient this had been done.

## 2022-12-16 NOTE — Telephone Encounter (Signed)
Patient states he has not received  his RX that Lurena Joiner was going to prescribe. He doesn't remember the name of medication but it was like Oxycodone

## 2022-12-16 NOTE — Progress Notes (Signed)
Office Visit Note   Patient: Caleb Edwards           Date of Birth: 1951/04/14           MRN: [id number] Visit Date: 12/15/2022 Requested by: Cherylann Parr, MD [place],  [place] [id number] PCP: Rubbie Battiest, MD  Subjective: Chief Complaint  Patient presents with   Lower Back - Pain    HPI: Caleb Edwards is a 72 y.o. male who presents to the office reporting low back pain.  Patient states he has a history of low back pain.  He is in pain management with Dr. Forestine Na.  He has had multiple injections in his low back including what sounds like SI joint injections as well as previous ESI's.  He has no history of prior lumbar spine surgery.  He describes low back pain as well as left leg radicular pain.  His pain has been specifically worse in the last 3 weeks without any history of injury.  He describes pain that travels into the left buttock and down the posterior aspect of the thigh to the posterior knee and the left leg.  He has no right leg radicular symptoms.  He has no radiation of pain below the leg.  He has very minimal groin pain.  Back pain bothers him significantly more than his leg pain though in the past he has had back pain fairly equivalent to his leg pain.  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  In his free time he enjoys going for walks but he does not do any heavy weightlifting or any sporting activities.  He denies any history of bowel/bladder incontinence or any saddle anesthesia.  He has no numbness or tingling in the leg.  He states that leaning forward with spine flexion improves his symptoms.  Leg will give out on him at times due to the pain shooting down..                ROS: All systems reviewed are negative as they relate to the chief complaint within the history of present illness.  Patient denies fevers or chills.  Assessment & Plan: Visit Diagnoses:  1. Spinal stenosis of lumbar region, unspecified whether neurogenic  claudication present     Plan: Patient is a 72 year old male who presents for evaluation of low back pain and left leg radicular pain.  He has symptoms that have been fairly severe over the last 3 weeks.  He has been taking oxycodone every 6 hours that he gets from his pain management provider with no significant relief.  He does not have a pain contract with Dr. Dossie Arbour.  No red flag signs or symptoms in his history or exam today.  He does have MRI of the lumbar spine demonstrating moderate to severe spinal stenosis and foraminal stenosis at multiple levels; at time of dictation, there is no report available from radiologist.  I discussed patient with Dr. Laurance Flatten and we will have him see Dr. Laurance Flatten early next week for formal evaluation.  He was scheduled for ESI with Dr. Dossie Arbour on 12/16/2022 but he was not approved for this procedure by his insurance so he likely will not be able to have it prior to seeing Dr. Laurance Flatten.  Instead, I will refill his oxycodone temporarily and prescribed gabapentin and he will follow-up with Dr. Laurance Flatten.  Follow-Up Instructions: No follow-ups on file.   Orders:  No orders of the defined types were  placed in this encounter.  No orders of the defined types were placed in this encounter.     Procedures: No procedures performed   Clinical Data: No additional findings.  Objective: Vital Signs: There were no vitals taken for this visit.  Physical Exam:  Constitutional: Patient appears well-developed HEENT:  Head: Normocephalic Eyes:EOM are normal Neck: Normal range of motion Cardiovascular: Normal rate Pulmonary/chest: Effort normal Neurologic: Patient is alert Skin: Skin is warm Psychiatric: Patient has normal mood and affect  Ortho Exam: Ortho exam demonstrates positive straight leg raise with symptoms reproduced in the left leg with both straight leg raise of the left and right lower extremities.  He has tenderness throughout the axial lumbar spine as well as  the left-sided SI joint.  He has no significant pain with FADIR sign when seated.  No pain with logroll of the left leg.  No clonus bilaterally.  He has 1+ patellar tendon reflex and Achilles tendon reflex bilaterally with no significant asymmetry.  5/5 motor strength of bilateral hip flexion, dorsiflexion, plantarflexion.  He has 5 -/5 quadricep and hamstring strength of the left leg compared with 5/5 strength in the right leg.  Specialty Comments:  No specialty comments available.  Imaging: No results found.   PMFS History: Patient Active Problem List   Diagnosis Date Noted   Chronic low back pain (Bilateral) w/ sciatica (Left) 12/07/2022   Lumbar facet joint pain 12/02/2022   Coccygodynia 12/02/2022   Sacrococcygeal pain 12/02/2022   Cervicothoracic interspinous bursitis (C6-7) 06/28/2022   Cervical radiculopathy (Right) 12/09/2021   Cervical spondylosis with radiculopathy 12/09/2021   Abnormal MRI, cervical spine (11/28/2020) 12/09/2021   Spondylosis without myelopathy or radiculopathy, lumbosacral region 06/30/2021   Falls, sequela 06/16/2021   Chronic use of opiate for therapeutic purpose 03/11/2021   Cervicalgia 10/28/2020   DDD (degenerative disc disease), cervical 10/28/2020   Abnormal x-ray of cervical spine 10/28/2020   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) 10/28/2020   Cervical facet arthropathy (Right: C4-5, C5-6) 10/28/2020   Grade 1 Anterolisthesis of cervical spine (1-2 mm) (C4/C5) 10/28/2020   Cervical foraminal stenosis (Right: C5-C8) (Left: C6-C7) 10/28/2020   Cervical radiculopathy (Left) 09/22/2020   Duodenal stenosis    Absolute anemia    Uncomplicated opioid dependence ([place]) 03/27/2020   Iron deficiency anemia due to chronic blood loss 01/08/2020   Lower GI bleed 08/08/2019   Pharmacologic therapy 06/04/2019   Disorder of skeletal system 06/04/2019   Problems influencing health status 06/04/2019   Chronic hip pain (2ry area of Pain) (Bilateral) (R>L)  06/04/2019   Grade 1 Anterolisthesis (7-69m) of L5 over S1 06/13/2018   DDD (degenerative disc disease), lumbosacral 06/13/2018   Lumbar facet arthropathy 06/13/2018   HTN (hypertension) 02/03/2018   CKD (chronic kidney disease), stage III ([place] 02/03/2018   Rectal bleeding 01/23/2018   Blood in stool    GI bleed 07/31/2017   Acute blood loss anemia 07/31/2017   Abdominal pain 07/31/2017   Near syncope 07/31/2017   Chronic pain syndrome 09/09/2016   Vitamin D insufficiency 01/07/2016   Symptomatic anemia 01/02/2016   Long term current use of opiate analgesic 09/24/2015   Long term prescription opiate use 09/24/2015   Opiate use 09/24/2015   Encounter for therapeutic drug level monitoring 09/24/2015   Encounter for chronic pain management 09/24/2015   Insomnia secondary to chronic pain 09/24/2015   Osteoarthritis 09/24/2015   Chronic low back pain (1ry area of Pain) (Bilateral) (R>L) w/o sciatica 09/24/2015   Lumbar spondylosis 09/24/2015  Chronic lower extremity pain (3ry area of Pain) (Bilateral) (R>L) 09/24/2015   Chronic lumbar radicular pain (Left) (L4 Dermatome) 09/24/2015   Lumbar facet syndrome (Bilateral) (R>L) 09/24/2015   Diffuse myofascial pain syndrome 09/24/2015   L4-5 disc bulge 09/24/2015   Lumbar foraminal stenosis (Bilateral) (L4-5) (L>R) 09/24/2015   Chronic neck pain 09/24/2015   Cervical spondylosis 09/24/2015   Past Medical History:  Diagnosis Date   Back pain    Blood transfusion without reported diagnosis 12/2015   bleeding ulcer hospitalization.    Diverticulitis    Hypertension    Kidney stone    Ulcer, stomach peptic, chronic     Family History  Problem Relation Age of Onset   Diabetes Mother    Heart disease Father    Prostate cancer Brother    Bladder Cancer Neg Hx    Kidney cancer Neg Hx     Past Surgical History:  Procedure Laterality Date   COLONOSCOPY WITH PROPOFOL N/A 08/02/2017   Procedure: COLONOSCOPY WITH PROPOFOL;  Surgeon:  Lucilla Lame, MD;  Location: [facility] ENDOSCOPY;  Service: Endoscopy;  Laterality: N/A;   COLONOSCOPY WITH PROPOFOL N/A 02/05/2018   Procedure: COLONOSCOPY WITH PROPOFOL;  Surgeon: Jonathon Bellows, MD;  Location: [facility] ENDOSCOPY;  Service: Gastroenterology;  Laterality: N/A;   COLONOSCOPY WITH PROPOFOL N/A 08/09/2019   Procedure: COLONOSCOPY WITH PROPOFOL;  Surgeon: Tol[place], Benay Pike, MD;  Location: [facility] ENDOSCOPY;  Service: Gastroenterology;  Laterality: N/A;   ESOPHAGOGASTRODUODENOSCOPY (EGD) WITH PROPOFOL N/A 01/03/2016   Procedure: ESOPHAGOGASTRODUODENOSCOPY (EGD) WITH PROPOFOL;  Surgeon: Manya Silvas, MD;  Location: [facility] ENDOSCOPY;  Service: Endoscopy;  Laterality: N/A;   ESOPHAGOGASTRODUODENOSCOPY (EGD) WITH PROPOFOL  08/02/2017   Procedure: ESOPHAGOGASTRODUODENOSCOPY (EGD) WITH PROPOFOL;  Surgeon: Lucilla Lame, MD;  Location: [facility] ENDOSCOPY;  Service: Endoscopy;;   ESOPHAGOGASTRODUODENOSCOPY (EGD) WITH PROPOFOL N/A 02/05/2018   Procedure: ESOPHAGOGASTRODUODENOSCOPY (EGD) WITH PROPOFOL;  Surgeon: Jonathon Bellows, MD;  Location: [facility] ENDOSCOPY;  Service: Gastroenterology;  Laterality: N/A;   ESOPHAGOGASTRODUODENOSCOPY (EGD) WITH PROPOFOL N/A 03/30/2018   Procedure: ESOPHAGOGASTRODUODENOSCOPY (EGD) WITH PROPOFOL WITH DILATION;  Surgeon: Jonathon Bellows, MD;  Location: [facility] ENDOSCOPY;  Service: Gastroenterology;  Laterality: N/A;   ESOPHAGOGASTRODUODENOSCOPY (EGD) WITH PROPOFOL N/A 08/09/2019   Procedure: ESOPHAGOGASTRODUODENOSCOPY (EGD) WITH PROPOFOL;  Surgeon: Tol[place], Benay Pike, MD;  Location: [facility] ENDOSCOPY;  Service: Gastroenterology;  Laterality: N/A;   ESOPHAGOGASTRODUODENOSCOPY (EGD) WITH PROPOFOL N/A 04/24/2020   Procedure: ESOPHAGOGASTRODUODENOSCOPY (EGD) WITH PROPOFOL;  Surgeon: Jonathon Bellows, MD;  Location: [facility] ENDOSCOPY;  Service: Gastroenterology;  Laterality: N/A;   GIVENS CAPSULE STUDY N/A 04/24/2020   Procedure: GIVENS CAPSULE STUDY;  Surgeon: Jonathon Bellows, MD;  Location: [facility] ENDOSCOPY;  Service:  Gastroenterology;  Laterality: N/A;   MAXILLARY ANTROSTOMY Left 12/10/2022   Procedure: MAXILLARY ANTROSTOMY WITH TISSUE REMOVAL;  Surgeon: Beverly Gust, MD;  Location: [place];  Service: ENT;  Laterality: Left;   Social History   Occupational History   Not on file  Tobacco Use   Smoking status: Former    Passive exposure: Past   Smokeless tobacco: Never  Vaping Use   Vaping Use: Never used  Substance and Sexual Activity   Alcohol use: Yes    Alcohol/week: 1.0 standard drink of alcohol    Types: 1 Standard drinks or equivalent per week   Drug use: No   Sexual activity: Yes

## 2022-12-16 NOTE — Progress Notes (Deleted)
Results of MRI are not available and prior authorization for the block has not been completed.

## 2022-12-16 NOTE — Telephone Encounter (Signed)
Sent in

## 2022-12-17 ENCOUNTER — Telehealth: Payer: Self-pay | Admitting: Orthopedic Surgery

## 2022-12-17 ENCOUNTER — Other Ambulatory Visit: Payer: Self-pay | Admitting: Surgical

## 2022-12-17 ENCOUNTER — Telehealth: Payer: Self-pay

## 2022-12-17 MED ORDER — OXYCODONE HCL 5 MG PO TABS: 5.0000 mg | ORAL_TABLET | Freq: Four times a day (QID) | ORAL | 0 refills | Status: DC | PRN

## 2022-12-17 NOTE — Telephone Encounter (Signed)
Patient states he need his rx to be tablets not capsules the pharmacy dosent have capsules

## 2022-12-17 NOTE — Telephone Encounter (Signed)
Sent in

## 2022-12-17 NOTE — Telephone Encounter (Signed)
Duplicate-see other note sent to [facility]

## 2022-12-17 NOTE — Telephone Encounter (Signed)
Mark with [employer] called stating that patient's insurance does not cover Oxycodone capsules and if new Rx could be sent for the tablets.  CB# [phone].  Please advise.  Thank you.

## 2022-12-20 ENCOUNTER — Ambulatory Visit: Payer: Medicare HMO | Admitting: Orthopedic Surgery

## 2022-12-20 ENCOUNTER — Other Ambulatory Visit (HOSPITAL_BASED_OUTPATIENT_CLINIC_OR_DEPARTMENT_OTHER): Payer: Medicare HMO | Admitting: Pain Medicine

## 2022-12-20 VITALS — BP 137/82 | HR 108 | Ht 72.0 in | Wt 174.0 lb

## 2022-12-20 DIAGNOSIS — M5416 Radiculopathy, lumbar region: Secondary | ICD-10-CM

## 2022-12-20 DIAGNOSIS — M810 Age-related osteoporosis without current pathological fracture: Secondary | ICD-10-CM | POA: Diagnosis not present

## 2022-12-20 DIAGNOSIS — R937 Abnormal findings on diagnostic imaging of other parts of musculoskeletal system: Secondary | ICD-10-CM | POA: Insufficient documentation

## 2022-12-20 LAB — FUNGAL ORGANISM REFLEX

## 2022-12-20 LAB — FUNGUS CULTURE WITH STAIN

## 2022-12-20 LAB — FUNGUS CULTURE RESULT

## 2022-12-20 MED ORDER — GABAPENTIN 300 MG PO CAPS: 300.0000 mg | ORAL_CAPSULE | Freq: Three times a day (TID) | ORAL | 0 refills | Status: DC

## 2022-12-20 MED ORDER — OXYCODONE HCL 5 MG PO TABS: 5.0000 mg | ORAL_TABLET | ORAL | 0 refills | Status: AC | PRN

## 2022-12-20 NOTE — Progress Notes (Signed)
Patient found to have significant changes on his Lumbar MRI requiring neurosurgical evaluation.

## 2022-12-20 NOTE — Progress Notes (Signed)
Orthopedic Spine Surgery Office Note  Assessment: Patient is a 72 y.o. male with chronic low back pain with acute onset of worsening low back pain and left buttock and posterior thigh pain.  Has multiple levels with stenosis but has a disc herniation L1-2 that may be causing the acute pain   Plan: -Patient has tried NSAIDs, Tylenol, intramuscular steroid injection  -Prescribed gabapentin help with the pain.  Referred to Dr. Ernestina Patches for L1/2 transforaminal injection -I told him he has several areas with stenosis but I suspect that this L1-2 disc herniation is what caused his acute onset of pain.  I informed him that decompressing all of his stenotic levels would be a larger surgery since he has stenosis from L1-S1.  Decompression could also result in progression of his scoliosis.  However, if he was having tolerable pain before, then maybe his acute pain from the L1-2 disc herniation.  I told him I want to use the injection to help determine if this is correct.  If so, then a single level decompression microdiscectomy may effectively treat his pain -Ordered a DEXA scan to screen for osteoporosis -Patient should return to office in 4 weeks, x-rays at next visit: Supine AP lumbar and scoli films    Patient expressed understanding of the plan and all questions were answered to the patient's satisfaction.   ___________________________________________________________________________   History:  Patient is a 72 y.o. male who presents today for lumbar spine.  Patient has a history of chronic low back pain and has been in pain management for this.  Then, starting about 1 month ago, he noticed acute onset of worse back and leg pain.  He feels the pain in his left buttock and down the posterior aspect of his left leg.  Pain is severe and rates as a 9 or 10 out of 10.  He has not had pain of this severity in a long time.  He has been on chronic tramadol and that is not reducing the pain at all.  He has tried  intramuscular steroid injections and those did not help.  He feels that his back locks up on him when he goes from sitting to standing position.  Pain is felt on a daily basis and he has not noticed anything in particular that makes it better or worse.  He even has the pain laying down.     Weakness: Yes, his back feels weaker.  No other weakness noted Symptoms of imbalance: Denies Paresthesias and numbness: Denies Bowel or bladder incontinence: Denies Saddle anesthesia: Denies  Treatments tried: NSAIDs, Tylenol, intramuscular steroid injection  Review of systems: Denies fevers and chills, night sweats, unexplained weight loss, history of cancer, pain that wakes them at night  Past medical history: Hypertension Vertigo Chronic pain Diverticulitis History of kidney stones  Allergies: NKDA  Past surgical history:  Maxillary antrostomy  Social history: Denies use of nicotine product (smoking, vaping, patches, smokeless) Alcohol use: Denies Denies recreational drug use  Physical Exam:  BMI of 23.6  General: no acute distress, appears stated age Neurologic: alert, answering questions appropriately, following commands Respiratory: unlabored breathing on room air, symmetric chest rise Psychiatric: appropriate affect, normal cadence to speech   MSK (spine):  -Strength exam      Left  Right EHL    5/5  5/5 TA    5/5  5/5 GSC    5/5  5/5 Knee extension  5/5  5/5 Hip flexion   5/5  5/5  -Sensory exam  Sensation intact to light touch in L3-S1 nerve distributions of bilateral lower extremities  -Achilles DTR: 1/4 on the left, 1/4 on the right -Patellar tendon DTR: 1/4 on the left, 1/4 on the right  -Straight leg raise: Negative -Contralateral straight leg raise: Negative -Femoral nerve stretch test: Negative bilaterally -Clonus: no beats bilaterally  -Left hip exam: No pain through range of motion, negative Stinchfield, negative FABER -Right hip exam: No pain  through range of motion, negative Stinchfield, negative FABER  Imaging: XR of the lumbar spine from 12/14/2022 was independently reviewed and interpreted, showing lumbar scoliosis with apex at L1/2.  Disc height loss at multiple levels throughout the lumbar spine.  Spondylolisthesis at L5/S1 that she has about 1 mm between flexion and extension views.  No fracture or dislocation seen.  MRI of the lumbar spine from 12/14/2022 was independently reviewed and interpreted, showing disc herniation L1 to on the right side with separate fragment cyst on the right side.  Central stenosis at L1/2, L2/3, L3/4, L4/5, L5/S1.  Foraminal stenosis bilaterally at L3/4, L4/5, L5/S1.    Patient name: Caleb Edwards Patient MRN: [id number] Date of visit: 12/20/22

## 2023-01-06 ENCOUNTER — Encounter: Payer: Self-pay | Admitting: Pain Medicine

## 2023-01-06 ENCOUNTER — Ambulatory Visit: Payer: Medicare HMO | Attending: Pain Medicine | Admitting: Pain Medicine

## 2023-01-06 ENCOUNTER — Ambulatory Visit
Admission: RE | Admit: 2023-01-06 | Discharge: 2023-01-06 | Disposition: A | Discharge status: Home / Self Care | Payer: Medicare HMO | Source: Ambulatory Visit | Attending: Pain Medicine | Admitting: Pain Medicine

## 2023-01-06 VITALS — BP 146/89 | HR 71 | Temp 97.3°F | Resp 18 | Ht 72.0 in | Wt 180.0 lb

## 2023-01-06 DIAGNOSIS — M79605 Pain in left leg: Secondary | ICD-10-CM | POA: Insufficient documentation

## 2023-01-06 DIAGNOSIS — M5136 Other intervertebral disc degeneration, lumbar region: Secondary | ICD-10-CM | POA: Insufficient documentation

## 2023-01-06 DIAGNOSIS — M25552 Pain in left hip: Secondary | ICD-10-CM | POA: Diagnosis present

## 2023-01-06 DIAGNOSIS — G8929 Other chronic pain: Secondary | ICD-10-CM

## 2023-01-06 DIAGNOSIS — M79604 Pain in right leg: Secondary | ICD-10-CM | POA: Diagnosis present

## 2023-01-06 DIAGNOSIS — M545 Low back pain, unspecified: Secondary | ICD-10-CM | POA: Diagnosis present

## 2023-01-06 DIAGNOSIS — M47816 Spondylosis without myelopathy or radiculopathy, lumbar region: Secondary | ICD-10-CM

## 2023-01-06 DIAGNOSIS — M5442 Lumbago with sciatica, left side: Secondary | ICD-10-CM | POA: Diagnosis present

## 2023-01-06 DIAGNOSIS — M4310 Spondylolisthesis, site unspecified: Secondary | ICD-10-CM

## 2023-01-06 DIAGNOSIS — M25551 Pain in right hip: Secondary | ICD-10-CM | POA: Diagnosis present

## 2023-01-06 DIAGNOSIS — M5137 Other intervertebral disc degeneration, lumbosacral region: Secondary | ICD-10-CM | POA: Diagnosis present

## 2023-01-06 DIAGNOSIS — M51369 Other intervertebral disc degeneration, lumbar region without mention of lumbar back pain or lower extremity pain: Secondary | ICD-10-CM

## 2023-01-06 DIAGNOSIS — M51379 Other intervertebral disc degeneration, lumbosacral region without mention of lumbar back pain or lower extremity pain: Secondary | ICD-10-CM

## 2023-01-06 DIAGNOSIS — M5416 Radiculopathy, lumbar region: Secondary | ICD-10-CM | POA: Diagnosis present

## 2023-01-06 DIAGNOSIS — M48061 Spinal stenosis, lumbar region without neurogenic claudication: Secondary | ICD-10-CM | POA: Diagnosis present

## 2023-01-06 MED ORDER — LACTATED RINGERS IV SOLN: Freq: Once | INTRAVENOUS | Status: DC

## 2023-01-06 MED ORDER — DEXAMETHASONE SODIUM PHOSPHATE 10 MG/ML IJ SOLN
20.0000 mg | Freq: Once | INTRAMUSCULAR | Status: AC
  Administered 2023-01-06: 20 mg

## 2023-01-06 MED ORDER — LIDOCAINE HCL 2 % IJ SOLN
20.0000 mL | Freq: Once | INTRAMUSCULAR | Status: AC
  Administered 2023-01-06: 100 mg

## 2023-01-06 MED ORDER — MIDAZOLAM HCL 5 MG/5ML IJ SOLN: 0.5000 mg | Freq: Once | INTRAMUSCULAR | Status: DC

## 2023-01-06 MED ORDER — ROPIVACAINE HCL 2 MG/ML IJ SOLN
2.0000 mL | Freq: Once | INTRAMUSCULAR | Status: AC
  Administered 2023-01-06: 2 mL via EPIDURAL

## 2023-01-06 MED ORDER — IOHEXOL 180 MG/ML  SOLN
INTRAMUSCULAR | Status: AC
  Filled 2023-01-06: qty 10

## 2023-01-06 MED ORDER — DEXAMETHASONE SODIUM PHOSPHATE 10 MG/ML IJ SOLN
INTRAMUSCULAR | Status: AC
  Filled 2023-01-06: qty 1

## 2023-01-06 MED ORDER — PENTAFLUOROPROP-TETRAFLUOROETH EX AERO
INHALATION_SPRAY | Freq: Once | CUTANEOUS | Status: AC
  Administered 2023-01-06: 30 via TOPICAL

## 2023-01-06 MED ORDER — LIDOCAINE HCL (PF) 2 % IJ SOLN
INTRAMUSCULAR | Status: AC
  Filled 2023-01-06: qty 10

## 2023-01-06 MED ORDER — FENTANYL CITRATE (PF) 100 MCG/2ML IJ SOLN
INTRAMUSCULAR | Status: AC
  Filled 2023-01-06: qty 2

## 2023-01-06 MED ORDER — MIDAZOLAM HCL 5 MG/5ML IJ SOLN
INTRAMUSCULAR | Status: AC
  Filled 2023-01-06: qty 5

## 2023-01-06 MED ORDER — SODIUM CHLORIDE (PF) 0.9 % IJ SOLN
INTRAMUSCULAR | Status: AC
  Filled 2023-01-06: qty 10

## 2023-01-06 MED ORDER — IOHEXOL 180 MG/ML  SOLN
10.0000 mL | Freq: Once | INTRAMUSCULAR | Status: AC
  Administered 2023-01-06: 10 mL via INTRATHECAL

## 2023-01-06 MED ORDER — ROPIVACAINE HCL 2 MG/ML IJ SOLN
INTRAMUSCULAR | Status: AC
  Filled 2023-01-06: qty 20

## 2023-01-06 MED ORDER — FENTANYL CITRATE (PF) 100 MCG/2ML IJ SOLN: 25.0000 ug | INTRAMUSCULAR | Status: DC | PRN

## 2023-01-06 MED ORDER — SODIUM CHLORIDE 0.9% FLUSH
2.0000 mL | Freq: Once | INTRAVENOUS | Status: AC
  Administered 2023-01-06: 2 mL

## 2023-01-06 NOTE — Progress Notes (Signed)
PROVIDER NOTE: Interpretation of information contained herein should be left to medically-trained personnel. Specific patient instructions are provided elsewhere under "Patient Instructions" section of medical record. This document was created in part using STT-dictation technology, any transcriptional errors that may result from this process are unintentional.  Patient: Caleb Edwards Type: Established DOB: 1951-01-15 MRN: [id number] PCP: Rubbie Battiest, MD  Service: Procedure DOS: 01/06/2023 Setting: Ambulatory Location: Ambulatory outpatient facility Delivery: Face-to-face Provider: Gaspar Cola, MD Specialty: Interventional Pain Management Specialty designation: 09 Location: Outpatient facility Ref. Prov.: Milinda Pointer, MD       Interventional Therapy   Procedure: Lumbar trans-foraminal epidural steroid injection (L-TFESI) #4  Laterality: Left (-LT)  Level: L4 & L5 nerve root(s) Imaging: Fluoroscopy-guided         Anesthesia: Local anesthesia (1-2% Lidocaine) Anxiolysis: None                (did not keep NPO orders) Sedation: No Sedation                       DOS: 01/06/2023  Performed by: Gaspar Cola, MD  Purpose: Diagnostic/Therapeutic Indications: Lumbar radicular pain severe enough to impact quality of life or function. 1. 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 L4-5 disc bulge   5. DDD (degenerative disc disease), lumbosacral   6. Chronic lower extremity pain (3ry area of Pain) (Bilateral) (R>L)    NAS-11 Pain score:   Pre-procedure: 8 /10   Post-procedure: 8 /10     Position / Prep / Materials:  Position: Prone  Prep solution: DuraPrep (Iodine Povacrylex [0.7% available iodine] and Isopropyl Alcohol, 74% w/w) Prep Area: Entire Posterior Lumbosacral Area.  From the lower tip of the scapula down to the tailbone and from  flank to flank. Materials:  Tray: Block Needle(s):  Type: Spinal  Gauge (G): 22  Length: 5-in  Qty: 2      Pre-op H&P Assessment:  Caleb Edwards is a 72 y.o. (year old), male patient, seen today for interventional treatment. He  has a past surgical history that includes Esophagogastroduodenoscopy (egd) with propofol (N/A, 01/03/2016); Colonoscopy with propofol (N/A, 08/02/2017); Esophagogastroduodenoscopy (egd) with propofol (08/02/2017); Esophagogastroduodenoscopy (egd) with propofol (N/A, 02/05/2018); Colonoscopy with propofol (N/A, 02/05/2018); Esophagogastroduodenoscopy (egd) with propofol (N/A, 03/30/2018); Esophagogastroduodenoscopy (egd) with propofol (N/A, 08/09/2019); Colonoscopy with propofol (N/A, 08/09/2019); Givens capsule study (N/A, 04/24/2020); Esophagogastroduodenoscopy (egd) with propofol (N/A, 04/24/2020); and Maxillary antrostomy (Left, 12/10/2022). Caleb Edwards has a current medication list which includes the following prescription(s): acetaminophen, amlodipine, chlorthalidone, ferrous sulfate, gabapentin, ibuprofen, lisinopril, naloxone, pantoprazole, rosuvastatin, sildenafil, tramadol, and trazodone. His primarily concern today is the Back Pain (mid)  Initial Vital Signs:  Pulse/HCG Rate: 71ECG Heart Rate: 69 Temp: (!) 97.3 F (36.3 C) Resp: 16 BP: (!) 151/89 SpO2: 99 %  BMI: Estimated body mass index is 24.41 kg/m as calculated from the following:   Height as of this encounter: 6' (1.829 m).   Weight as of this encounter: 180 lb (81.6 kg).  Risk Assessment: Allergies: Reviewed. He has No Known Allergies.  Allergy Precautions: None required Coagulopathies: Reviewed. None identified.  Blood-thinner therapy: None at this time Active Infection(s): Reviewed. None identified. Caleb Edwards is afebrile  Site Confirmation: [name] was asked to confirm the procedure and laterality before marking the site Procedure checklist: Completed Consent: Before the procedure and under the  influence of no sedative(s), amnesic(s), or anxiolytics, the patient was informed of the treatment options, risks and possible complications. To fulfill our ethical and legal obligations, as recommended by the American Medical Association's Code of Ethics, I have informed the patient of my clinical impression; the nature and purpose of the treatment or procedure; the risks, benefits, and possible complications of the intervention; the alternatives, including doing nothing; the risk(s) and benefit(s) of the alternative treatment(s) or procedure(s); and the risk(s) and benefit(s) of doing nothing. The patient was provided information about the general risks and possible complications associated with the procedure. These may include, but are not limited to: failure to achieve desired goals, infection, bleeding, organ or nerve damage, allergic reactions, paralysis, and death. In addition, the patient was informed of those risks and complications associated to Spine-related procedures, such as failure to decrease pain; infection (i.e.: Meningitis, epidural or intraspinal abscess); bleeding (i.e.: epidural hematoma, subarachnoid hemorrhage, or any other type of intraspinal or peri-dural bleeding); organ or nerve damage (i.e.: Any type of peripheral nerve, nerve root, or spinal cord injury) with subsequent damage to sensory, motor, and/or autonomic systems, resulting in permanent pain, numbness, and/or weakness of one or several areas of the body; allergic reactions; (i.e.: anaphylactic reaction); and/or death. Furthermore, the patient was informed of those risks and complications associated with the medications. These include, but are not limited to: allergic reactions (i.e.: anaphylactic or anaphylactoid reaction(s)); adrenal axis suppression; blood sugar elevation that in diabetics may result in ketoacidosis or comma; water retention that in patients with history of congestive heart failure may result in shortness of  breath, pulmonary edema, and decompensation with resultant heart failure; weight gain; swelling or edema; medication-induced neural toxicity; particulate matter embolism and blood vessel occlusion with resultant organ, and/or nervous system infarction; and/or aseptic necrosis of one or more joints. Finally, the patient was informed that Medicine is not an exact science; therefore, there is also the possibility of unforeseen or unpredictable risks and/or possible complications that may result in a catastrophic outcome. The patient indicated having understood very clearly. We have given the patient no guarantees and we have made no promises. Enough time was given to the patient to ask questions, all of which were answered to the patient's satisfaction. Mr. Shade has indicated that he wanted to continue with the procedure. Attestation: I, the ordering provider, attest that I have discussed with the patient the benefits, risks, side-effects, alternatives, likelihood of achieving goals, and potential problems during recovery for the procedure that I have provided informed consent. Date  Time: 01/06/2023  8:23 AM   Pre-Procedure Preparation:  Monitoring: As per clinic protocol. Respiration, ETCO2, SpO2, BP, heart rate and rhythm monitor placed and checked for adequate function Safety Precautions: Patient was assessed for positional comfort and pressure points before starting the procedure. Time-out: I initiated and conducted the "Time-out" before starting the procedure, as per protocol. The patient was asked to participate by confirming the accuracy of the "Time Out" information. Verification of the correct person, site, and procedure were performed and confirmed by me, the nursing staff, and the patient. "Time-out" conducted as per Joint Commission's Universal Protocol (UP.01.01.01). Time: 0917 Start Time: 0917 hrs.  Description/Narrative of Procedure:          Target: The 6 o'clock position under the  pedicle, on the affected side. Region: Posterolateral Lumbosacral Approach: Posterior Percutaneous Paravertebral approach.  Rationale (medical necessity): procedure needed and proper for the diagnosis and/or treatment of the patient's medical symptoms and needs.  Procedural Technique Safety Precautions: Aspiration looking for blood return was conducted prior to all injections. At no point did we inject any substances, as a needle was being advanced. No attempts were made at seeking any paresthesias. Safe injection practices and needle disposal techniques used. Medications properly checked for expiration dates. SDV (single dose vial) medications used. Description of the Procedure: Protocol guidelines were followed. The patient was placed in position over the procedure table. The target area was identified and the area prepped in the usual manner. Skin & deeper tissues infiltrated with local anesthetic. Appropriate amount of time allowed to pass for local anesthetics to take effect. The procedure needles were then advanced to the target area. Proper needle placement secured. Negative aspiration confirmed. Solution injected in intermittent fashion, asking for systemic symptoms every 0.5cc of injectate. The needles were then removed and the area cleansed, making sure to leave some of the prepping solution back to take advantage of its long term bactericidal properties.  Vitals:   01/06/23 0916 01/06/23 0921 01/06/23 0926 01/06/23 0928  BP: (!) 152/85 (!) 154/89 (!) 149/84 (!) 146/89  Pulse:      Resp: 18 16 16 18   Temp:      SpO2: 99% 99% 98% 97%  Weight:      Height:        Start Time: 0917 hrs. End Time: 0928 hrs.  Imaging Guidance (Spinal):          Type of Imaging Technique: Fluoroscopy Guidance (Spinal) Indication(s): Assistance in needle guidance and placement for procedures requiring needle placement in or near specific anatomical locations not easily accessible without such  assistance. Exposure Time: Please see nurses notes. Contrast: Before injecting any contrast, we confirmed that the patient did not have an allergy to iodine, shellfish, or radiological contrast. Once satisfactory needle placement was completed at the desired level, radiological contrast was injected. Contrast injected under live fluoroscopy. No contrast complications. See chart for type and volume of contrast used. Fluoroscopic Guidance: I was personally present during the use of fluoroscopy. "Tunnel Vision Technique" used to obtain the best possible view of the target area. Parallax error corrected before commencing the procedure. "Direction-depth-direction" technique used to introduce the needle under continuous pulsed fluoroscopy. Once target was reached, antero-posterior, oblique, and lateral fluoroscopic projection used confirm needle placement in all planes. Images permanently stored in EMR. Interpretation: I personally interpreted the imaging intraoperatively. Adequate needle placement confirmed in multiple planes. Appropriate spread of contrast into desired area was observed. No evidence of afferent or efferent intravascular uptake. No intrathecal or subarachnoid spread observed. Permanent images saved into the patient's record.  Post-operative Assessment:  Post-procedure Vital Signs:  Pulse/HCG Rate: 7184 Temp: (!) 97.3 F (36.3 C) Resp: 18 BP: (!) 146/89 SpO2: 97 %  EBL: None  Complications: No immediate post-treatment complications observed by team, or reported by patient.  Note: The patient tolerated the entire procedure well. A repeat set of vitals were taken after the procedure and the patient was kept under observation following institutional policy, for this type of procedure. Post-procedural neurological assessment was performed, showing return to baseline, prior to discharge. The patient was provided with post-procedure discharge instructions, including a section on how to  identify potential problems. Should any problems arise concerning this procedure, the patient was given instructions to immediately contact us, at any time, without hesitation. In any case, we plan to contact the patient by telephone for a follow-up status report regarding this interventional procedure.  Comments:  No additional relevant  information.  Plan of Care (POC)  Orders:  Orders Placed This Encounter  Procedures   Lumbar Transforaminal Epidural    Scheduling Instructions:     Laterality: Left-sided     Level(s): L4 & L5     Sedation: With Sedation.     Timeframe: Today    Order Specific Question:   Where will this procedure be performed?    Answer:   [facility]   DG [facility] C-ARM 1-60 MIN NO REPORT    Intraoperative interpretation by procedural physician at [place].    Standing Status:   Standing    Number of Occurrences:   1    Order Specific Question:   Reason for exam:    Answer:   Assistance in needle guidance and placement for procedures requiring needle placement in or near specific anatomical locations not easily accessible without such assistance.   Informed Consent Details: Physician/Practitioner Attestation; Transcribe to consent form and obtain patient signature    Provider Attestation: I, [place] Dossie Arbour, MD, (Pain Management Specialist), the physician/practitioner, attest that I have discussed with the patient the benefits, risks, side effects, alternatives, likelihood of achieving goals and potential problems during recovery for the procedure that I have provided informed consent.    Scheduling Instructions:     Note: Always confirm laterality of pain with Mr. Aveni, before procedure.     Transcribe to consent form and obtain patient signature.    Order Specific Question:   Physician/Practitioner attestation of informed consent for procedure/surgical case    Answer:   I, the physician/practitioner, attest that I have discussed with  the patient the benefits, risks, side effects, alternatives, likelihood of achieving goals and potential problems during recovery for the procedure that I have provided informed consent.    Order Specific Question:   Procedure    Answer:   Diagnostic lumbar transforaminal epidural steroid injection under fluoroscopic guidance. (See notes for level and laterality.)    Order Specific Question:   Physician/Practitioner performing the procedure    Answer:   Keymani Glynn A. Dossie Arbour, MD    Order Specific Question:   Indication/Reason    Answer:   Lumbar radiculopathy/radiculitis associated with lumbar stenosis   Provide equipment / supplies at bedside    Procedure tray: "Block Tray" (Disposable  single use) Skin infiltration needle: Regular 1.5-in, 25-G, (x1) Block Needle type: Spinal Amount/quantity: 2 Size: Medium (5-inch) Gauge: 22G    Standing Status:   Standing    Number of Occurrences:   1    Order Specific Question:   Specify    Answer:   Block Tray   Chronic Opioid Analgesic:  Tramadol 50 mg, 2 tabs PO q 6 hrs (400 mg/day of tramadol) MME/day: 40 mg/day.   Medications ordered for procedure: Meds ordered this encounter  Medications   iohexol (OMNIPAQUE) 180 MG/ML injection 10 mL    Must be Myelogram-compatible. If not available, you may substitute with a water-soluble, non-ionic, hypoallergenic, myelogram-compatible radiological contrast medium.   lidocaine (XYLOCAINE) 2 % (with pres) injection 400 mg   pentafluoroprop-tetrafluoroeth (GEBAUERS) aerosol   DISCONTD: lactated ringers infusion   DISCONTD: midazolam (VERSED) 5 MG/5ML injection 0.5-2 mg    Make sure Flumazenil is available in the pyxis when using this medication. If oversedation occurs, administer 0.2 mg IV over 15 sec. If after 45 sec no response, administer 0.2 mg again over 1 min; may repeat at 1 min intervals; not to exceed 4 doses (1 mg)   DISCONTD:  fentaNYL (SUBLIMAZE) injection 25-50 mcg    Make sure Narcan is  available in the pyxis when using this medication. In the event of respiratory depression (RR< 8/min): Titrate NARCAN (naloxone) in increments of 0.1 to 0.2 mg IV at 2-3 minute intervals, until desired degree of reversal.   sodium chloride flush (NS) 0.9 % injection 2 mL    This is for a two (2) level block. Use two (2) syringes and divide content in half.   ropivacaine (PF) 2 mg/mL (0.2%) (NAROPIN) injection 2 mL    This is for a two (2) level block. Use two (2) syringes and divide content in half.   dexamethasone (DECADRON) injection 20 mg    This is for a two (2) level block. Use two (2) syringes and divide content in half.   Medications administered: We administered iohexol, lidocaine, pentafluoroprop-tetrafluoroeth, sodium chloride flush, ropivacaine (PF) 2 mg/mL (0.2%), and dexamethasone.  See the medical record for exact dosing, route, and time of administration.  Follow-up plan:   Return in 2 weeks (on 01/20/2023) for Proc-day (T,Th), (Face2F), (PPE).       Interventional Therapies  Risk Factors  Considerations:   Stage3 CKD  HTN  Hx GI Bleed  Anemia w/ near syncope   Planned  Pending:   Diagnostic/therapeutic left L4 TFESI #3 + left L5-S1 LESI #1    Under consideration:   Diagnostic right IA hip joint injection  Diagnostic bilateral lumbar facet block  Possible bilateral lumbar facet RFA    Completed:   Therapeutic midline C6-7 interspinous ligament inj. x1 (06/28/2022) (80/90/75/75)  Diagnostic bilateral lumbar facet MBB x2 (12/02/2022) (100/100/0/0) did not help his left leg pain. Therapeutic left cervical ESI x2 (02/05/2021) (100/100/70 x 10 days/50)  Therapeutic right cervical ESI x1 (12/15/2021) (90/90/90/90)  Therapeutic left L3-4 LESI x2 (06/13/2018) (100/100/85/85)  Therapeutic left L3 TFESI x1 (03/13/2020) (95/95/60/>50)  Therapeutic left L4 TFESI x2 (03/13/2020) (95/95/60/>50)  Therapeutic right L3 TFESI x2 (03/13/2020) (95/95/60/>50)  Therapeutic right L4 TFESI  x3 (03/13/2020) (95/95/60/>50)  Referral to neurosurgery Elayne Guerin, MD) for cervical instability (11/11/2020)    Therapeutic  Palliative (PRN) options:   Palliative left L3-4 LESI   Palliative left L3 TFESI   Palliative left L4 TFESI   Palliative right L3 TFESI   Palliative right L4 TFESI           Recent Visits Date Type Provider Dept  12/08/22 Office Visit Milinda Pointer, MD [facility]  12/02/22 Procedure visit Milinda Pointer, MD [facility]  11/22/22 Office Visit Milinda Pointer, MD [facility]  Showing recent visits within past 90 days and meeting all other requirements Today's Visits Date Type Provider Dept  01/06/23 Procedure visit Milinda Pointer, MD [facility]  Showing today's visits and meeting all other requirements Future Appointments Date Type Provider Dept  01/20/23 Appointment Milinda Pointer, MD [facility]  01/26/23 Appointment Milinda Pointer, MD [facility]  Showing future appointments within next 90 days and meeting all other requirements  Disposition: Discharge home  Discharge (Date  Time): 01/06/2023;   hrs.   Primary Care Physician: Rubbie Battiest, MD Location: [facility] Note by: Gaspar Cola, MD (TTS technology used. I apologize for any typographical errors that were not detected and corrected.) Date: 01/06/2023; Time: 11:27 AM  Disclaimer:  Medicine is not an Chief Strategy Officer. The only guarantee in medicine is that nothing is guaranteed. It is important to note that the decision to proceed with this  intervention was based on the information collected from the patient. The Data and conclusions were drawn from the patient's questionnaire, the interview, and the physical examination. Because the information was provided in large part by the patient, it cannot be guaranteed that it has not been purposely or unconsciously manipulated.  Every effort has been made to obtain as much relevant data as possible for this evaluation. It is important to note that the conclusions that lead to this procedure are derived in large part from the available data. Always take into account that the treatment will also be dependent on availability of resources and existing treatment guidelines, considered by other Pain Management Practitioners as being common knowledge and practice, at the time of the intervention. For Medico-Legal purposes, it is also important to point out that variation in procedural techniques and pharmacological choices are the acceptable norm. The indications, contraindications, technique, and results of the above procedure should only be interpreted and judged by a Board-Certified Interventional Pain Specialist with extensive familiarity and expertise in the same exact procedure and technique.

## 2023-01-06 NOTE — Patient Instructions (Signed)

## 2023-01-06 NOTE — Progress Notes (Signed)
Safety precautions to be maintained throughout the outpatient stay will include: orient to surroundings, keep bed in low position, maintain call bell within reach at all times, provide assistance with transfer out of bed and ambulation.  

## 2023-01-07 ENCOUNTER — Telehealth: Payer: Self-pay

## 2023-01-07 NOTE — Telephone Encounter (Signed)
Post procedure follow up.  Patient states he is doing good.  

## 2023-01-20 ENCOUNTER — Ambulatory Visit: Payer: Medicare HMO | Admitting: Pain Medicine

## 2023-01-24 ENCOUNTER — Encounter: Payer: Self-pay | Admitting: Pain Medicine

## 2023-01-24 ENCOUNTER — Ambulatory Visit: Payer: Medicare HMO | Attending: Pain Medicine | Admitting: Pain Medicine

## 2023-01-24 VITALS — BP 159/85 | HR 86 | Temp 97.0°F | Resp 16 | Ht 72.0 in | Wt 185.0 lb

## 2023-01-24 DIAGNOSIS — F1120 Opioid dependence, uncomplicated: Secondary | ICD-10-CM | POA: Diagnosis present

## 2023-01-24 DIAGNOSIS — M79605 Pain in left leg: Secondary | ICD-10-CM | POA: Diagnosis present

## 2023-01-24 DIAGNOSIS — Z79899 Other long term (current) drug therapy: Secondary | ICD-10-CM | POA: Diagnosis present

## 2023-01-24 DIAGNOSIS — M79604 Pain in right leg: Secondary | ICD-10-CM | POA: Insufficient documentation

## 2023-01-24 DIAGNOSIS — M545 Low back pain, unspecified: Secondary | ICD-10-CM | POA: Diagnosis present

## 2023-01-24 DIAGNOSIS — G8929 Other chronic pain: Secondary | ICD-10-CM | POA: Diagnosis present

## 2023-01-24 DIAGNOSIS — M542 Cervicalgia: Secondary | ICD-10-CM | POA: Insufficient documentation

## 2023-01-24 DIAGNOSIS — M4726 Other spondylosis with radiculopathy, lumbar region: Secondary | ICD-10-CM

## 2023-01-24 DIAGNOSIS — G894 Chronic pain syndrome: Secondary | ICD-10-CM | POA: Diagnosis present

## 2023-01-24 DIAGNOSIS — M25551 Pain in right hip: Secondary | ICD-10-CM | POA: Diagnosis not present

## 2023-01-24 DIAGNOSIS — M5416 Radiculopathy, lumbar region: Secondary | ICD-10-CM | POA: Insufficient documentation

## 2023-01-24 DIAGNOSIS — M7918 Myalgia, other site: Secondary | ICD-10-CM | POA: Diagnosis present

## 2023-01-24 DIAGNOSIS — M47816 Spondylosis without myelopathy or radiculopathy, lumbar region: Secondary | ICD-10-CM | POA: Diagnosis present

## 2023-01-24 DIAGNOSIS — M25552 Pain in left hip: Secondary | ICD-10-CM | POA: Insufficient documentation

## 2023-01-24 LAB — ACID FAST CULTURE WITH REFLEXED SENSITIVITIES (MYCOBACTERIA): Acid Fast Culture: NEGATIVE

## 2023-01-24 MED ORDER — TRAMADOL HCL 50 MG PO TABS: 100.0000 mg | ORAL_TABLET | Freq: Four times a day (QID) | ORAL | 0 refills | Status: DC

## 2023-01-24 NOTE — Progress Notes (Signed)
PROVIDER NOTE: Information contained herein reflects review and annotations entered in association with encounter. Interpretation of such information and data should be left to medically-trained personnel. Information provided to patient can be located elsewhere in the medical record under "Patient Instructions". Document created using STT-dictation technology, any transcriptional errors that may result from process are unintentional.    Patient: Caleb Edwards  Service Category: E/M  Provider: Oswaldo Done, MD  DOB: 02-23-1951  DOS: 01/24/2023  Referring Provider: Antonieta Loveless, MD  MRN: [id number]  Specialty: Interventional Pain Management  PCP: Diana Eves, MD  Type: Established Patient  Setting: Ambulatory outpatient    Location: Office  Delivery: Face-to-face     HPI  [name], a 72 y.o. year old male, is here today because of his Chronic radicular lumbar pain [M54.16, G89.29]. Caleb Edwards primary complain today is Hip Pain (Left )  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); Chronic neck pain; Cervical spondylosis; Chronic pain syndrome; Abdominal pain; Grade 1 Anterolisthesis (7-67mm) of L5 over S1; DDD (degenerative disc disease), lumbosacral; Lumbar facet arthropathy; Chronic hip pain (2ry area of Pain) (Bilateral) (R>L); Cervical radiculopathy (Left); Cervicalgia; DDD (degenerative disc disease), cervical; Abnormal x-ray of cervical spine; 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); Cervical facet arthropathy (Right: C4-5, C5-6); Grade 1 Anterolisthesis of cervical spine (1-2 mm) (C4/C5); Cervical foraminal stenosis (Right:  C5-C8) (Left: C6-C7); Falls, sequela; Spondylosis without myelopathy or radiculopathy, lumbosacral region; Cervical radiculopathy (Right); Cervical spondylosis with radiculopathy; Abnormal MRI, cervical spine (11/28/2020); 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 Pain Assessment: Severity of Chronic pain is reported as a 6 /10. Location: Hip Left/sometimes down the back of left leg. Onset: More than a month ago. Quality: Discomfort, Burning, Constant, Nagging, Aching. Timing: Constant. Modifying factor(s): procedure did help. Vitals:  height is 6' (1.829 m) and weight is 185 lb (83.9 kg). His temporal temperature is 97 F (36.1 C) (abnormal). His blood pressure is 159/85 (abnormal) and his pulse is 86. His respiration is 16 and oxygen saturation is 100%.  BMI: Estimated body mass index is 25.09 kg/m as calculated from the following:   Height as of this encounter: 6' (1.829 m).   Weight as of this encounter: 185 lb (83.9 kg). Last encounter: 12/08/2022. Last procedure: 01/06/2023.  Reason for encounter: both, medication management and post-procedure evaluation and assessment.  The patient indicates doing well with the current medication regimen. No adverse reactions or side effects reported to the medications.   Unfortunately, the patient did not bring his pills to the appointment and therefore today we have given him only a 30-day prescription.  He indicated that he would be bringing them tomorrow, if he does, then we will send a prescription for a longer period of time.  RTCB: 02/23/2023   Post-procedure evaluation   Procedure: Lumbar trans-foraminal epidural steroid injection (L-TFESI) #4  Laterality: Left (-LT)  Level: L4 & L5 nerve root(s) Imaging: Fluoroscopy-guided         Anesthesia: Local anesthesia  (1-2% Lidocaine) Anxiolysis: None                (  did not keep NPO orders) Sedation: No Sedation                       DOS: 01/06/2023  Performed by: Oswaldo Done, MD  Purpose: Diagnostic/Therapeutic Indications: Lumbar radicular pain severe enough to impact quality of life or function. 1. 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 L4-5 disc bulge   5. DDD (degenerative disc disease), lumbosacral   6. Chronic lower extremity pain (3ry area of Pain) (Bilateral) (R>L)    NAS-11 Pain score:   Pre-procedure: 8 /10   Post-procedure: 8 /10      Effectiveness:  Initial hour after procedure: 100 %. Subsequent 4-6 hours post-procedure: 100 %. Analgesia past initial 6 hours: 100 %. Ongoing improvement:  Analgesic: The patient indicates currently having a 95% ongoing relief of the pain with just a little bit of discomfort that seems to be intermittent around the left PSIS area.  Other than that, the rest of the pain seems to be gone. Function: Caleb Edwards reports improvement in function ROM: Caleb Edwards reports improvement in ROM  Pharmacotherapy Assessment  Analgesic: Tramadol 50 mg, 2 tabs PO q 6 hrs (400 mg/day of tramadol) MME/day: 40 mg/day.   Monitoring: [place] PMP: PDMP reviewed during this encounter.       Pharmacotherapy: No side-effects or adverse reactions reported. Compliance: No problems identified. Effectiveness: Clinically acceptable.  Vernie Ammons, RN  01/24/2023 11:50 AM  Sign when Signing Visit Nursing Pain Medication Assessment:  Safety precautions to be maintained throughout the outpatient stay will include: orient to surroundings, keep bed in low position, maintain call bell within reach at all times, provide assistance with transfer out of bed and ambulation.  Medication Inspection Compliance: Caleb Edwards did not comply with our  request to bring his pills to be counted. He was reminded that bringing the medication bottles, even when empty, is a requirement.  Medication: None brought in. Pill/Patch Count: None available to be counted. Bottle Appearance: No container available. Did not bring bottle(s) to appointment. Filled Date: N/A Last Medication intake:  Today    No results found for: "CBDTHCR" No results found for: "D8THCCBX" No results found for: "D9THCCBX"  UDS:  Summary  Date Value Ref Range Status  06/28/2022 Note  Final    Comment:    ==================================================================== [employer] Select 13 (MW) ==================================================================== Test                             Result       Flag       Units  Drug Present and Declared for Prescription Verification   Tramadol                       >11111       EXPECTED   ng/mg creat   O-Desmethyltramadol            >11111       EXPECTED   ng/mg creat   N-Desmethyltramadol            2920         EXPECTED   ng/mg creat    Source of tramadol is a prescription medication. O-desmethyltramadol    and N-desmethyltramadol are expected metabolites of tramadol.  ==================================================================== Test  Result    Flag   Units      Ref Range   Creatinine              45               mg/dL      >=16 ==================================================================== Declared Medications:  The flagging and interpretation on this report are based on the  following declared medications.  Unexpected results may arise from  inaccuracies in the declared medications.   **Note: The testing scope of this panel includes these medications:   Tramadol (Ultram)   **Note: The testing scope of this panel does not include the  following reported medications:   Amlodipine (Norvasc)  Chlorthalidone (Hygroton)  Ibuprofen (Advil)  Iron  Lisinopril (Zestril)   Pantoprazole (Protonix)  Rosuvastatin (Crestor)  Sildenafil (Viagra)  Trazodone (Desyrel) ==================================================================== For clinical consultation, please call [phone]. ====================================================================       ROS  Constitutional: Denies any fever or chills Gastrointestinal: No reported hemesis, hematochezia, vomiting, or acute GI distress Musculoskeletal: Denies any acute onset joint swelling, redness, loss of ROM, or weakness Neurological: No reported episodes of acute onset apraxia, aphasia, dysarthria, agnosia, amnesia, paralysis, loss of coordination, or loss of consciousness  Medication Review  acetaminophen, amLODipine, chlorthalidone, ferrous sulfate, gabapentin, ibuprofen, lisinopril, naloxone, pantoprazole, rosuvastatin, sildenafil, traMADol, and traZODone  History Review  Allergy: Caleb Edwards has No Known Allergies. Drug: Caleb Edwards  reports no history of drug use. Alcohol:  reports current alcohol use of about 1.0 standard drink of alcohol per week. Tobacco:  reports that he has quit smoking. He has been exposed to tobacco smoke. He has never used smokeless tobacco. Social: Caleb Edwards  reports that he has quit smoking. He has been exposed to tobacco smoke. He has never used smokeless tobacco. He reports current alcohol use of about 1.0 standard drink of alcohol per week. He reports that he does not use drugs. Medical:  has a past medical history of Back pain, Blood transfusion without reported diagnosis (12/2015), Diverticulitis, Hypertension, Kidney stone, and Ulcer, stomach peptic, chronic. Surgical: Caleb Edwards  has a past surgical history that includes Esophagogastroduodenoscopy (egd) with propofol (N/A, 01/03/2016); Colonoscopy with propofol (N/A, 08/02/2017); Esophagogastroduodenoscopy (egd) with propofol (08/02/2017); Esophagogastroduodenoscopy (egd) with propofol (N/A, 02/05/2018); Colonoscopy with  propofol (N/A, 02/05/2018); Esophagogastroduodenoscopy (egd) with propofol (N/A, 03/30/2018); Esophagogastroduodenoscopy (egd) with propofol (N/A, 08/09/2019); Colonoscopy with propofol (N/A, 08/09/2019); Givens capsule study (N/A, 04/24/2020); Esophagogastroduodenoscopy (egd) with propofol (N/A, 04/24/2020); and Maxillary antrostomy (Left, 12/10/2022). Family: family history includes Diabetes in his mother; Heart disease in his father; Prostate cancer in his brother.  Laboratory Chemistry Profile   Renal Lab Results  Component Value Date   BUN 18 02/21/2020   CREATININE 1.24 02/21/2020   GFRAA >60 02/21/2020   GFRNONAA 60 (L) 02/21/2020    Hepatic Lab Results  Component Value Date   AST 23 08/09/2019   ALT 15 08/09/2019   ALBUMIN 3.8 08/09/2019   ALKPHOS 53 08/09/2019   LIPASE 17 08/08/2019    Electrolytes Lab Results  Component Value Date   NA 138 08/09/2019   K 3.9 08/09/2019   CL 106 08/09/2019   CALCIUM 8.7 (L) 08/09/2019   MG 1.9 01/04/2016    Bone Lab Results  Component Value Date   VD25OH 24.0 (L) 12/26/2015    Inflammation (CRP: Acute Phase) (ESR: Chronic Phase) Lab Results  Component Value Date   CRP 0.6 12/26/2015   ESRSEDRATE 3 12/26/2015  Note: Above Lab results reviewed.  Recent Imaging Review  DG PAIN CLINIC C-ARM 1-60 MIN NO REPORT Fluoro was used, but no Radiologist interpretation will be provided.  Please refer to "NOTES" tab for provider progress note. Note: Reviewed        Physical Exam  General appearance: Well nourished, well developed, and well hydrated. In no apparent acute distress Mental status: Alert, oriented x 3 (person, place, & time)       Respiratory: No evidence of acute respiratory distress Eyes: PERLA Vitals: BP (!) 159/85 (BP Location: Left Arm, Patient Position: Sitting, Cuff Size: Normal)   Pulse 86   Temp (!) 97 F (36.1 C) (Temporal)   Resp 16   Ht 6' (1.829 m)   Wt 185 lb (83.9 kg)   SpO2 100%   BMI 25.09 kg/m   BMI: Estimated body mass index is 25.09 kg/m as calculated from the following:   Height as of this encounter: 6' (1.829 m).   Weight as of this encounter: 185 lb (83.9 kg). Ideal: Ideal body weight: 77.6 kg (171 lb 1.2 oz) Adjusted ideal body weight: 80.1 kg (176 lb 10.3 oz)  Assessment   Diagnosis Status  1. 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 Chronic low back pain (1ry area of Pain) (Bilateral) (R>L) w/o sciatica   3. Chronic hip pain (2ry area of Pain) (Bilateral) (R>L)   4. Chronic lower extremity pain (3ry area of Pain) (Bilateral) (R>L)   5. Lumbar facet syndrome (Bilateral) (R>L)   6. Diffuse myofascial pain syndrome   7. Chronic neck pain   8. Chronic pain syndrome   9. Pharmacologic therapy   10. Uncomplicated opioid dependence   11. Encounter for medication management   12. Encounter for chronic pain management    Resolved Improved Resolved   Updated Problems: No problems updated.  Plan of Care  Problem-specific:  No problem-specific Assessment & Plan notes found for this encounter.  Caleb Edwards has a current medication list which includes the following long-term medication(s): chlorthalidone, gabapentin, lisinopril, pantoprazole, rosuvastatin, tramadol, and trazodone.  Pharmacotherapy (Medications Ordered): Meds ordered this encounter  Medications   traMADol (ULTRAM) 50 MG tablet    Sig: Take 2 tablets (100 mg total) by mouth every 6 (six) hours. Each refill must last 30 days.    Dispense:  240 tablet    Refill:  0    DO NOT: delete (not duplicate); no partial-fill (will deny script to complete), no refill request (F/U required). DISPENSE: 1 day early if closed on fill date. WARN: No CNS-depressants within 8 hrs of med.   Orders:  No orders of the defined types were placed in this encounter.  Follow-up plan:   Return in about 1 month (around 02/23/2023) for Eval-day (M,W), (F2F), (MM).      Interventional Therapies  Risk  Factors  Considerations:   Stage3 CKD  HTN  Hx GI Bleed  Anemia w/ near syncope   Planned  Pending:      Under consideration:   Diagnostic right IA hip joint injection  Diagnostic bilateral lumbar facet block  Possible bilateral lumbar facet RFA    Completed:   Diagnostic/therapeutic left L4 TFESI x3 + left L5-S1 LESI x1 (01/06/2023) (100/100/100/95) (LLEP:100  LBP:95)  Therapeutic midline C6-7 interspinous ligament inj. x1 (06/28/2022) (80/90/75/75)  Diagnostic bilateral lumbar facet MBB x2 (12/02/2022) (100/100/0/0) did not help his left leg pain. Therapeutic left cervical ESI x2 (02/05/2021) (100/100/70 x 10 days/50)  Therapeutic  right cervical ESI x1 (12/15/2021) (90/90/90/90)  Therapeutic left L3-4 LESI x2 (06/13/2018) (100/100/85/85)  Therapeutic left L3 TFESI x1 (03/13/2020) (95/95/60/>50)  Therapeutic left L4 TFESI x2 (03/13/2020) (95/95/60/>50)  Therapeutic right L3 TFESI x2 (03/13/2020) (95/95/60/>50)  Therapeutic right L4 TFESI x3 (03/13/2020) (95/95/60/>50)  Referral to neurosurgery Volanda Napoleon, MD) for cervical instability (11/11/2020)    Therapeutic  Palliative (PRN) options:   Palliative left L3-4 LESI   Palliative left L3 TFESI   Palliative left L4 TFESI   Palliative right L3 TFESI   Palliative right L4 TFESI        Recent Visits Date Type Provider Dept  01/06/23 Procedure visit Delano Metz, MD [facility]  12/08/22 Office Visit Delano Metz, MD [facility]  12/02/22 Procedure visit Delano Metz, MD [facility]  11/22/22 Office Visit Delano Metz, MD [facility]  Showing recent visits within past 90 days and meeting all other requirements Today's Visits Date Type Provider Dept  01/24/23 Office Visit Delano Metz, MD [facility]  Showing today's visits and meeting all other requirements Future Appointments Date Type Provider Dept  02/01/23 Appointment Delano Metz,  MD [facility]  Showing future appointments within next 90 days and meeting all other requirements  I discussed the assessment and treatment plan with the patient. The patient was provided an opportunity to ask questions and all were answered. The patient agreed with the plan and demonstrated an understanding of the instructions.  Patient advised to call back or seek an in-person evaluation if the symptoms or condition worsens.  Duration of encounter: 30 minutes.  Total time on encounter, as per AMA guidelines included both the face-to-face and non-face-to-face time personally spent by the physician and/or other qualified health care professional(s) on the day of the encounter (includes time in activities that require the physician or other qualified health care professional and does not include time in activities normally performed by clinical staff). Physician's time may include the following activities when performed: Preparing to see the patient (e.g., pre-charting review of records, searching for previously ordered imaging, lab work, and nerve conduction tests) Review of prior analgesic pharmacotherapies. Reviewing PMP Interpreting ordered tests (e.g., lab work, imaging, nerve conduction tests) Performing post-procedure evaluations, including interpretation of diagnostic procedures Obtaining and/or reviewing separately obtained history Performing a medically appropriate examination and/or evaluation Counseling and educating the patient/family/caregiver Ordering medications, tests, or procedures Referring and communicating with other health care professionals (when not separately reported) Documenting clinical information in the electronic or other health record Independently interpreting results (not separately reported) and communicating results to the patient/ family/caregiver Care coordination (not separately reported)  Note by: Oswaldo Done, MD Date: 01/24/2023; Time:  12:22 PM

## 2023-01-24 NOTE — Patient Instructions (Signed)
____________________________________________________________________________________________  Opioid Pain Medication Update  To: All patients taking opioid pain medications. (I.e.: hydrocodone, hydromorphone, oxycodone, oxymorphone, morphine, codeine, methadone, tapentadol, tramadol, buprenorphine, fentanyl, etc.)  Re: Updated review of side effects and adverse reactions of opioid analgesics, as well as new information about long term effects of this class of medications.  Direct risks of long-term opioid therapy are not limited to opioid addiction and overdose. Potential medical risks include serious fractures, breathing problems during sleep, hyperalgesia, immunosuppression, chronic constipation, bowel obstruction, myocardial infarction, and tooth decay secondary to xerostomia.  Unpredictable adverse effects that can occur even if you take your medication correctly: Cognitive impairment, respiratory depression, and death. Most people think that if they take their medication "correctly", and "as instructed", that they will be safe. Nothing could be farther from the truth. In reality, a significant amount of recorded deaths associated with the use of opioids has occurred in individuals that had taken the medication for a long time, and were taking their medication correctly. The following are examples of how this can happen: Patient taking his/her medication for a long time, as instructed, without any side effects, is given a certain antibiotic or another unrelated medication, which in turn triggers a "Drug-to-drug interaction" leading to disorientation, cognitive impairment, impaired reflexes, respiratory depression or an untoward event leading to serious bodily harm or injury, including death.  Patient taking his/her medication for a long time, as instructed, without any side effects, develops an acute impairment of liver and/or kidney function. This will lead to a rapid inability of the body to  breakdown and eliminate their pain medication, which will result in effects similar to an "overdose", but with the same medicine and dose that they had always taken. This again may lead to disorientation, cognitive impairment, impaired reflexes, respiratory depression or an untoward event leading to serious bodily harm or injury, including death.  A similar problem will occur with patients as they grow older and their liver and kidney function begins to decrease as part of the aging process.  Background information: Historically, the original case for using long-term opioid therapy to treat chronic noncancer pain was based on safety assumptions that subsequent experience has called into question. In 1996, the American Pain Society and the American Academy of Pain Medicine issued a consensus statement supporting long-term opioid therapy. This statement acknowledged the dangers of opioid prescribing but concluded that the risk for addiction was low; respiratory depression induced by opioids was short-lived, occurred mainly in opioid-naive patients, and was antagonized by pain; tolerance was not a common problem; and efforts to control diversion should not constrain opioid prescribing. This has now proven to be wrong. Experience regarding the risks for opioid addiction, misuse, and overdose in community practice has failed to support these assumptions.  According to the Centers for Disease Control and Prevention, fatal overdoses involving opioid analgesics have increased sharply over the past decade. Currently, more than 96,700 people die from drug overdoses every year. Opioids are a factor in 7 out of every 10 overdose deaths. Deaths from drug overdose have surpassed motor vehicle accidents as the leading cause of death for individuals between the ages of 35 and 54.  Clinical data suggest that neuroendocrine dysfunction may be very common in both men and women, potentially causing hypogonadism, erectile  dysfunction, infertility, decreased libido, osteoporosis, and depression. Recent studies linked higher opioid dose to increased opioid-related mortality. Controlled observational studies reported that long-term opioid therapy may be associated with increased risk for cardiovascular events. Subsequent meta-analysis concluded   that the safety of long-term opioid therapy in elderly patients has not been proven.   Side Effects and adverse reactions: Common side effects: Drowsiness (sedation). Dizziness. Nausea and vomiting. Constipation. Physical dependence -- Dependence often manifests with withdrawal symptoms when opioids are discontinued or decreased. Tolerance -- As you take repeated doses of opioids, you require increased medication to experience the same effect of pain relief. Respiratory depression -- This can occur in healthy people, especially with higher doses. However, people with COPD, asthma or other lung conditions may be even more susceptible to fatal respiratory impairment.  Uncommon side effects: An increased sensitivity to feeling pain and extreme response to pain (hyperalgesia). Chronic use of opioids can lead to this. Delayed gastric emptying (the process by which the contents of your stomach are moved into your small intestine). Muscle rigidity. Immune system and hormonal dysfunction. Quick, involuntary muscle jerks (myoclonus). Arrhythmia. Itchy skin (pruritus). Dry mouth (xerostomia).  Long-term side effects: Chronic constipation. Sleep-disordered breathing (SDB). Increased risk of bone fractures. Hypothalamic-pituitary-adrenal dysregulation. Increased risk of overdose.  RISKS: Fractures and Falls:  Opioids increase the risk and incidence of falls. This is of particular importance in elderly patients.  Endocrine System:  Long-term administration is associated with endocrine abnormalities (endocrinopathies). (Also known as Opioid-induced Endocrinopathy) Influences  on both the hypothalamic-pituitary-adrenal axis?and the hypothalamic-pituitary-gonadal axis have been demonstrated with consequent hypogonadism and adrenal insufficiency in both sexes. Hypogonadism and decreased levels of dehydroepiandrosterone sulfate have been reported in men and women. Endocrine effects include: Amenorrhoea in women (abnormal absence of menstruation) Reduced libido in both sexes Decreased sexual function Erectile dysfunction in men Hypogonadisms (decreased testicular function with shrinkage of testicles) Infertility Depression and fatigue Loss of muscle mass Anxiety Depression Immune suppression Hyperalgesia Weight gain Anemia Osteoporosis Patients (particularly women of childbearing age) should avoid opioids. There is insufficient evidence to recommend routine monitoring of asymptomatic patients taking opioids in the long-term for hormonal deficiencies.  Immune System: Human studies have demonstrated that opioids have an immunomodulating effect. These effects are mediated via opioid receptors both on immune effector cells and in the central nervous system. Opioids have been demonstrated to have adverse effects on antimicrobial response and anti-tumour surveillance. Buprenorphine has been demonstrated to have no impact on immune function.  Opioid Induced Hyperalgesia: Human studies have demonstrated that prolonged use of opioids can lead to a state of abnormal pain sensitivity, sometimes called opioid induced hyperalgesia (OIH). Opioid induced hyperalgesia is not usually seen in the absence of tolerance to opioid analgesia. Clinically, hyperalgesia may be diagnosed if the patient on long-term opioid therapy presents with increased pain. This might be qualitatively and anatomically distinct from pain related to disease progression or to breakthrough pain resulting from development of opioid tolerance. Pain associated with hyperalgesia tends to be more diffuse than the  pre-existing pain and less defined in quality. Management of opioid induced hyperalgesia requires opioid dose reduction.  Cancer: Chronic opioid therapy has been associated with an increased risk of cancer among noncancer patients with chronic pain. This association was more evident in chronic strong opioid users. Chronic opioid consumption causes significant pathological changes in the small intestine and colon. Epidemiological studies have found that there is a link between opium dependence and initiation of gastrointestinal cancers. Cancer is the second leading cause of death after cardiovascular disease. Chronic use of opioids can cause multiple conditions such as GERD, immunosuppression and renal damage as well as carcinogenic effects, which are associated with the incidence of cancers.   Mortality: Long-term opioid use   has been associated with increased mortality among patients with chronic non-cancer pain (CNCP).  Prescription of long-acting opioids for chronic noncancer pain was associated with a significantly increased risk of all-cause mortality, including deaths from causes other than overdose.  Reference: Von Korff M, Kolodny A, Deyo RA, Chou R. Long-term opioid therapy reconsidered. Ann Intern Med. 2011 Sep 6;155(5):325-8. doi: 10.7326/[id number]. PMID: 21893626; PMCID: PMC3280085. Bedson J, Chen Y, Ashworth J, Hayward RA, Dunn KM, Jordan KP. Risk of adverse events in patients prescribed long-term opioids: A cohort study in the [place] Clinical Practice Research Datalink. Eur J Pain. 2019 May;23(5):908-922. doi: 10.1002/ejp.1357. Epub 2019 Jan 31. PMID: 30620116. Colameco S, Coren JS, Ciervo CA. Continuous opioid treatment for chronic noncancer pain: a time for moderation in prescribing. Postgrad Med. 2009 Jul;121(4):61-6. doi: 10.3810/pgm.2009.07.2032. PMID: 19641271. Chou R, Turner JA, Devine EB, Hansen RN, Sullivan SD, Blazina I, Dana T, Bougatsos C, Deyo RA. The  effectiveness and risks of long-term opioid therapy for chronic pain: a systematic review for a National Institutes of Health Pathways to Prevention Workshop. Ann Intern Med. 2015 Feb 17;162(4):276-86. doi: 10.7326/M14-2559. PMID: 25581257. Warner M, Chen LH, Makuc DM. NCHS Data Brief No. 22. [place]: Centers for Disease Control and Prevention; 2009. Sep, Increase in Fatal Poisonings Involving Opioid Analgesics in the [place], 1999-2006. Song IA, Choi HR, Oh TK. Long-term opioid use and mortality in patients with chronic non-cancer pain: Ten-year follow-up study in [place] from 2010 through 2019. EClinicalMedicine. 2022 Jul 18;51:101558. doi: 10.1016/j.eclinm.2022.101558. PMID: 35875817; PMCID: PMC9304910. Huser, W., Schubert, T., Vogelmann, T. et al. All-cause mortality in patients with long-term opioid therapy compared with non-opioid analgesics for chronic non-cancer pain: a database study. BMC Med 18, 162 (2020). https://doi.org/10.1186/[email or web address] Rashidian H, Zendehdel K, Kamangar F, Malekzadeh R, Haghdoost AA. An Ecological Study of the Association between Opiate Use and Incidence of Cancers. Addict Health. 2016 Fall;8(4):252-260. PMID: 28819556; PMCID: PMC5554805.  Our Goal: Our goal is to control your pain with means other than the use of opioid pain medications.  Our Recommendation: Talk to your physician about coming off of these medications. We can assist you with the tapering down and stopping these medicines. Based on the new information, even if you cannot completely stop the medication, a decrease in the dose may be associated with a lesser risk. Ask for other means of controlling the pain. Decrease or eliminate those factors that significantly contribute to your pain such as smoking, obesity, and a diet heavily tilted towards "inflammatory" nutrients.  Last Updated: 12/01/2022    ____________________________________________________________________________________________     ____________________________________________________________________________________________  Patient Information update  To: All of our patients.  Re: Name change.  It has been made official that our current name, "[facility]"   will soon be changed to "[facility]".   The purpose of this change is to eliminate any confusion created by the concept of our practice being a "Medication Management Pain Clinic". In the past this has led to the misconception that we treat pain primarily by the use of prescription medications.  Nothing can be farther from the truth.   Understanding PAIN MANAGEMENT: To further understand what our practice does, you first have to understand that "Pain Management" is a subspecialty that requires additional training once a physician has completed their specialty training, which can be in either Anesthesia, Neurology, Psychiatry, or Physical Medicine and Rehabilitation (PMR). Each one of these contributes to the final approach taken by each physician to   the management of their patient's pain. To be a "Pain Management Specialist" you must have first completed one of the specialty trainings below.  Anesthesiologists - trained in clinical pharmacology and interventional techniques such as nerve blockade and regional as well as central neuroanatomy. They are trained to block pain before, during, and after surgical interventions.  Neurologists - trained in the diagnosis and pharmacological treatment of complex neurological conditions, such as Multiple Sclerosis, Parkinson's, spinal cord injuries, and other systemic conditions that may be associated with symptoms that may include but are not limited to pain. They tend to rely primarily on the treatment of chronic pain  using prescription medications.  Psychiatrist - trained in conditions affecting the psychosocial wellbeing of patients including but not limited to depression, anxiety, schizophrenia, personality disorders, addiction, and other substance use disorders that may be associated with chronic pain. They tend to rely primarily on the treatment of chronic pain using prescription medications.   Physical Medicine and Rehabilitation (PMR) physicians, also known as physiatrists - trained to treat a wide variety of medical conditions affecting the brain, spinal cord, nerves, bones, joints, ligaments, muscles, and tendons. Their training is primarily aimed at treating patients that have suffered injuries that have caused severe physical impairment. Their training is primarily aimed at the physical therapy and rehabilitation of those patients. They may also work alongside orthopedic surgeons or neurosurgeons using their expertise in assisting surgical patients to recover after their surgeries.  INTERVENTIONAL PAIN MANAGEMENT is sub-subspecialty of Pain Management.  Our physicians are Board-certified in Anesthesia, Pain Management, and Interventional Pain Management.  This meaning that not only have they been trained and Board-certified in their specialty of Anesthesia, and subspecialty of Pain Management, but they have also received further training in the sub-subspecialty of Interventional Pain Management, in order to become Board-certified as INTERVENTIONAL PAIN MANAGEMENT SPECIALIST.    Mission: Our goal is to use our skills in  INTERVENTIONAL PAIN MANAGEMENT as alternatives to the chronic use of prescription opioid medications for the treatment of pain. To make this more clear, we have changed our name to reflect what we do and offer. We will continue to offer medication management assessment and recommendations, but we will not be taking over any patient's medication  management.  ____________________________________________________________________________________________     ____________________________________________________________________________________________  National Pain Medication Shortage  The [place] is experiencing worsening drug shortages. These have had a negative widespread effect on patient care and treatment. Not expected to improve any time soon. Predicted to last past 2029.   Drug shortage list (generic names) Oxycodone IR Oxycodone/APAP Oxymorphone IR Hydromorphone Hydrocodone/APAP Morphine  Where is the problem?  Manufacturing and supply level.  Will this shortage affect you?  Only if you take any of the above pain medications.  How? You may be unable to fill your prescription.  Your pharmacist may offer a "partial fill" of your prescription. (Warning: Do not accept partial fills.) Prescriptions partially filled cannot be transferred to another pharmacy. Read our Medication Rules and Regulation. Depending on how much medicine you are dependent on, you may experience withdrawals when unable to get the medication.  Recommendations: Consider ending your dependence on opioid pain medications. Ask your pain specialist to assist you with the process. Consider switching to a medication currently not in shortage, such as Buprenorphine. Talk to your pain specialist about this option. Consider decreasing your pain medication requirements by managing tolerance thru "Drug Holidays". This may help minimize withdrawals, should you run out of medicine. Control your pain thru   the use of non-pharmacological interventional therapies.   Your prescriber: Prescribers cannot be blamed for shortages. Medication manufacturing and supply issues cannot be fixed by the prescriber.   NOTE: The prescriber is not responsible for supplying the medication, or solving supply issues. Work with your pharmacist to solve it. The patient is responsible for  the decision to take or continue taking the medication and for identifying and securing a legal supply source. By law, supplying the medication is the job and responsibility of the pharmacy. The prescriber is responsible for the evaluation, monitoring, and prescribing of these medications.   Prescribers will NOT: Re-issue prescriptions that have been partially filled. Re-issue prescriptions already sent to a pharmacy.  Re-send prescriptions to a different pharmacy because yours did not have your medication. Ask pharmacist to order more medicine or transfer the prescription to another pharmacy. (Read below.)  New 2023 regulation: "June 04, 2022 Revised Regulation Allows DEA-Registered Pharmacies to Transfer Electronic Prescriptions at a Patient's Request DEA Headquarters Division - Public Information Office Patients now have the ability to request their electronic prescription be transferred to another pharmacy without having to go back to their practitioner to initiate the request. This revised regulation went into effect on Monday, May 31, 2022.     At a patient's request, a DEA-registered retail pharmacy can now transfer an electronic prescription for a controlled substance (schedules II-V) to another DEA-registered retail pharmacy. Prior to this change, patients would have to go through their practitioner to cancel their prescription and have it re-issued to a different pharmacy. The process was taxing and time consuming for both patients and practitioners.    The Drug Enforcement Administration (DEA) published its intent to revise the process for transferring electronic prescriptions on August 22, 2020.  The final rule was published in the federal register on April 29, 2022 and went into effect 30 days later.  Under the final rule, a prescription can only be transferred once between pharmacies, and only if allowed under existing state or other applicable law. The prescription must  remain in its electronic form; may not be altered in any way; and the transfer must be communicated directly between two licensed pharmacists. It's important to note, any authorized refills transfer with the original prescription, which means the entire prescription will be filled at the same pharmacy".  Reference: https://www.dea.gov/stories/2023/2023-06/2022-09-01/revised-regulation-allows-dea-registered-pharmacies-transfer (DEA website announcement)  https://www.govinfo.gov/content/pkg/FR-2022-04-29/pdf/2023-15847.pdf (Federal Register  Department of Justice)   Federal Register / Vol. 88, No. 143 / Thursday, April 29, 2022 / Rules and Regulations DEPARTMENT OF JUSTICE  Drug Enforcement Administration  21 CFR Part 1306  [Docket No. [id number]]  RIN [id number]-AB64 Transfer of Electronic Prescriptions for Schedules II-V Controlled Substances Between Pharmacies for Initial Filling  ____________________________________________________________________________________________     ____________________________________________________________________________________________  Transfer of Pain Medication between Pharmacies  Re: 2023 DEA Clarification on existing regulation  Published on DEA Website: June 04, 2022  Title: Revised Regulation Allows DEA-Registered Pharmacies to Transfer Electronic Prescriptions at a Patient's Request DEA Headquarters Division - Public Information Office  "Patients now have the ability to request their electronic prescription be transferred to another pharmacy without having to go back to their practitioner to initiate the request. This revised regulation went into effect on Monday, May 31, 2022.     At a patient's request, a DEA-registered retail pharmacy can now transfer an electronic prescription for a controlled substance (schedules II-V) to another DEA-registered retail pharmacy. Prior to this change, patients would have to go through their practitioner to  cancel their prescription   and have it re-issued to a different pharmacy. The process was taxing and time consuming for both patients and practitioners.    The Drug Enforcement Administration (DEA) published its intent to revise the process for transferring electronic prescriptions on August 22, 2020.  The final rule was published in the federal register on April 29, 2022 and went into effect 30 days later.  Under the final rule, a prescription can only be transferred once between pharmacies, and only if allowed under existing state or other applicable law. The prescription must remain in its electronic form; may not be altered in any way; and the transfer must be communicated directly between two licensed pharmacists. It's important to note, any authorized refills transfer with the original prescription, which means the entire prescription will be filled at the same pharmacy."    REFERENCES: 1. DEA website announcement https://www.dea.gov/stories/2023/2023-06/2022-09-01/revised-regulation-allows-dea-registered-pharmacies-transfer  2. Department of Justice website  https://www.govinfo.gov/content/pkg/FR-2022-04-29/pdf/2023-15847.pdf  3. DEPARTMENT OF JUSTICE Drug Enforcement Administration 21 CFR Part 1306 [Docket No. [id number]] RIN [id number] "Transfer of Electronic Prescriptions for Schedules II-V Controlled Substances Between Pharmacies for Initial Filling"  ____________________________________________________________________________________________     _______________________________________________________________________  Medication Rules  Purpose: To inform patients, and their family members, of our medication rules and regulations.  Applies to: All patients receiving prescriptions from our practice (written or electronic).  Pharmacy of record: This is the pharmacy where your electronic prescriptions will be sent. Make sure we have the correct one.  Electronic prescriptions: In  compliance with the [place] Strengthen Opioid Misuse Prevention (STOP) Act of 2017 (Session Law 2017-74/H243), effective October 04, 2018, all controlled substances must be electronically prescribed. Written prescriptions, faxing, or calling prescriptions to a pharmacy will no longer be done.  Prescription refills: These will be provided only during in-person appointments. No medications will be renewed without a "face-to-face" evaluation with your provider. Applies to all prescriptions.  NOTE: The following applies primarily to controlled substances (Opioid* Pain Medications).   Type of encounter (visit): For patients receiving controlled substances, face-to-face visits are required. (Not an option and not up to the patient.)  Patient's responsibilities: Pain Pills: Bring all pain pills to every appointment (except for procedure appointments). Pill Bottles: Bring pills in original pharmacy bottle. Bring bottle, even if empty. Always bring the bottle of the most recent fill.  Medication refills: You are responsible for knowing and keeping track of what medications you are taking and when is it that you will need a refill. The day before your appointment: write a list of all prescriptions that need to be refilled. The day of the appointment: give the list to the admitting nurse. Prescriptions will be written only during appointments. No prescriptions will be written on procedure days. If you forget a medication: it will not be "Called in", "Faxed", or "electronically sent". You will need to get another appointment to get these prescribed. No early refills. Do not call asking to have your prescription filled early. Partial  or short prescriptions: Occasionally your pharmacy may not have enough pills to fill your prescription.  NEVER ACCEPT a partial fill or a prescription that is short of the total amount of pills that you were prescribed.  With controlled substances the law allows 72 hours for  the pharmacy to complete the prescription.  If the prescription is not completed within 72 hours, the pharmacist will require a new prescription to be written. This means that you will be short on your medicine and we WILL NOT send another prescription to complete your original   prescription.  Instead, request the pharmacy to send a carrier to a nearby branch to get enough medication to provide you with your full prescription. Prescription Accuracy: You are responsible for carefully inspecting your prescriptions before leaving our office. Have the discharge nurse carefully go over each prescription with you, before taking them home. Make sure that your name is accurately spelled, that your address is correct. Check the name and dose of your medication to make sure it is accurate. Check the number of pills, and the written instructions to make sure they are clear and accurate. Make sure that you are given enough medication to last until your next medication refill appointment. Taking Medication: Take medication as prescribed. When it comes to controlled substances, taking less pills or less frequently than prescribed is permitted and encouraged. Never take more pills than instructed. Never take the medication more frequently than prescribed.  Inform other Doctors: Always inform, all of your healthcare providers, of all the medications you take. Pain Medication from other Providers: You are not allowed to accept any additional pain medication from any other Doctor or Healthcare provider. There are two exceptions to this rule. (see below) In the event that you require additional pain medication, you are responsible for notifying us, as stated below. Cough Medicine: Often these contain an opioid, such as codeine or hydrocodone. Never accept or take cough medicine containing these opioids if you are already taking an opioid* medication. The combination may cause respiratory failure and death. Medication Agreement:  You are responsible for carefully reading and following our Medication Agreement. This must be signed before receiving any prescriptions from our practice. Safely store a copy of your signed Agreement. Violations to the Agreement will result in no further prescriptions. (Additional copies of our Medication Agreement are available upon request.) Laws, Rules, & Regulations: All patients are expected to follow all Federal and State Laws, Statutes, Rules, & Regulations. Ignorance of the Laws does not constitute a valid excuse.  Illegal drugs and Controlled Substances: The use of illegal substances (including, but not limited to marijuana and its derivatives) and/or the illegal use of any controlled substances is strictly prohibited. Violation of this rule may result in the immediate and permanent discontinuation of any and all prescriptions being written by our practice. The use of any illegal substances is prohibited. Adopted CDC guidelines & recommendations: Target dosing levels will be at or below 60 MME/day. Use of benzodiazepines** is not recommended.  Exceptions: There are only two exceptions to the rule of not receiving pain medications from other Healthcare Providers. Exception #1 (Emergencies): In the event of an emergency (i.e.: accident requiring emergency care), you are allowed to receive additional pain medication. However, you are responsible for: As soon as you are able, call our office [phone], at any time of the day or night, and leave a message stating your name, the date and nature of the emergency, and the name and dose of the medication prescribed. In the event that your call is answered by a member of our staff, make sure to document and save the date, time, and the name of the person that took your information.  Exception #2 (Planned Surgery): In the event that you are scheduled by another doctor or dentist to have any type of surgery or procedure, you are allowed (for a period no  longer than 30 days), to receive additional pain medication, for the acute post-op pain. However, in this case, you are responsible for picking up a copy of   our "Post-op Pain Management for Surgeons" handout, and giving it to your surgeon or dentist. This document is available at our office, and does not require an appointment to obtain it. Simply go to our office during business hours (Monday-Thursday from 8:00 AM to 4:00 PM) (Friday 8:00 AM to 12:00 Noon) or if you have a scheduled appointment with us, prior to your surgery, and ask for it by name. In addition, you are responsible for: calling our office [phone], at any time of the day or night, and leaving a message stating your name, name of your surgeon, type of surgery, and date of procedure or surgery. Failure to comply with your responsibilities may result in termination of therapy involving the controlled substances. Medication Agreement Violation. Following the above rules, including your responsibilities will help you in avoiding a Medication Agreement Violation ("Breaking your Pain Medication Contract").  Consequences:  Not following the above rules may result in permanent discontinuation of medication prescription therapy.  *Opioid medications include: morphine, codeine, oxycodone, oxymorphone, hydrocodone, hydromorphone, meperidine, tramadol, tapentadol, buprenorphine, fentanyl, methadone. **Benzodiazepine medications include: diazepam (Valium), alprazolam (Xanax), clonazepam (Klonopine), lorazepam (Ativan), clorazepate (Tranxene), chlordiazepoxide (Librium), estazolam (Prosom), oxazepam (Serax), temazepam (Restoril), triazolam (Halcion) (Last updated: 07/27/2022) ______________________________________________________________________    ______________________________________________________________________  Medication Recommendations and Reminders  Applies to: All patients receiving prescriptions (written and/or  electronic).  Medication Rules & Regulations: You are responsible for reading, knowing, and following our "Medication Rules" document. These exist for your safety and that of others. They are not flexible and neither are we. Dismissing or ignoring them is an act of "non-compliance" that may result in complete and irreversible termination of such medication therapy. For safety reasons, "non-compliance" will not be tolerated. As with the [place] fundamental legal principle of "ignorance of the law is no defense", we will accept no excuses for not having read and knowing the content of documents provided to you by our practice.  Pharmacy of record:  Definition: This is the pharmacy where your electronic prescriptions will be sent.  We do not endorse any particular pharmacy. It is up to you and your insurance to decide what pharmacy to use.  We do not restrict you in your choice of pharmacy. However, once we write for your prescriptions, we will NOT be re-sending more prescriptions to fix restricted supply problems created by your pharmacy, or your insurance.  The pharmacy listed in the electronic medical record should be the one where you want electronic prescriptions to be sent. If you choose to change pharmacy, simply notify our nursing staff. Changes will be made only during your regular appointments and not over the phone.  Recommendations: Keep all of your pain medications in a safe place, under lock and key, even if you live alone. We will NOT replace lost, stolen, or damaged medication. We do not accept "Police Reports" as proof of medications having been stolen. After you fill your prescription, take 1 week's worth of pills and put them away in a safe place. You should keep a separate, properly labeled bottle for this purpose. The remainder should be kept in the original bottle. Use this as your primary supply, until it runs out. Once it's gone, then you know that you have 1 week's worth of medicine,  and it is time to come in for a prescription refill. If you do this correctly, it is unlikely that you will ever run out of medicine. To make sure that the above recommendation works, it is very important that you make   sure your medication refill appointments are scheduled at least 1 week before you run out of medicine. To do this in an effective manner, make sure that you do not leave the office without scheduling your next medication management appointment. Always ask the nursing staff to show you in your prescription , when your medication will be running out. Then arrange for the receptionist to get you a return appointment, at least 7 days before you run out of medicine. Do not wait until you have 1 or 2 pills left, to come in. This is very poor planning and does not take into consideration that we may need to cancel appointments due to bad weather, sickness, or emergencies affecting our staff. DO NOT ACCEPT A "Partial Fill": If for any reason your pharmacy does not have enough pills/tablets to completely fill or refill your prescription, do not allow for a "partial fill". The law allows the pharmacy to complete that prescription within 72 hours, without requiring a new prescription. If they do not fill the rest of your prescription within those 72 hours, you will need a separate prescription to fill the remaining amount, which we will NOT provide. If the reason for the partial fill is your insurance, you will need to talk to the pharmacist about payment alternatives for the remaining tablets, but again, DO NOT ACCEPT A PARTIAL FILL, unless you can trust your pharmacist to obtain the remainder of the pills within 72 hours.  Prescription refills and/or changes in medication(s):  Prescription refills, and/or changes in dose or medication, will be conducted only during scheduled medication management appointments. (Applies to both, written and electronic prescriptions.) No refills on procedure days. No  medication will be changed or started on procedure days. No changes, adjustments, and/or refills will be conducted on a procedure day. Doing so will interfere with the diagnostic portion of the procedure. No phone refills. No medications will be "called into the pharmacy". No Fax refills. No weekend refills. No Holliday refills. No after hours refills.  Remember:  Business hours are:  Monday to Thursday 8:00 AM to 4:00 PM Provider's Schedule: Elanor Cale, MD - Appointments are:  Medication management: Monday and Wednesday 8:00 AM to 4:00 PM Procedure day: Tuesday and Thursday 7:30 AM to 4:00 PM Bilal Lateef, MD - Appointments are:  Medication management: Tuesday and Thursday 8:00 AM to 4:00 PM Procedure day: Monday and Wednesday 7:30 AM to 4:00 PM (Last update: 07/27/2022) ______________________________________________________________________    ____________________________________________________________________________________________  Drug Holidays  What is a "Drug Holiday"? Drug Holiday: is the name given to the process of slowly tapering down and temporarily stopping the pain medication for the purpose of decreasing or eliminating tolerance to the drug.  Benefits Improved effectiveness Decreased required effective dose Improved pain control End dependence on high dose therapy Decrease cost of therapy Uncovering "opioid-induced hyperalgesia". (OIH)  What is "opioid hyperalgesia"? It is a paradoxical increase in pain caused by exposure to opioids. Stopping the opioid pain medication, contrary to the expected, it actually decreases or completely eliminates the pain. Ref.: "A comprehensive review of opioid-induced hyperalgesia". Marion Lee, et.al. Pain Physician. 2011 Mar-Apr;14(2):145-61.  What is tolerance? Tolerance: the progressive loss of effectiveness of a pain medicine due to repetitive use. A common problem of opioid pain medications.  How long should a "Drug  Holiday" last? Effectiveness depends on the patient staying off all opioid pain medicines for a minimum of 14 consecutive days. (2 weeks)  How about just taking less of the medicine? Does not   work. Will not accomplish goal of eliminating the excess receptors.  How about switching to a different pain medicine? (AKA. "Opioid rotation") Does not work. Creates the illusion of effectiveness by taking advantage of inaccurate equivalent dose calculations between different opioids. -This "technique" was promoted by studies funded by pharmaceutical companies, such as [employer], creators of "OxyContin".  Can I stop the medicine "cold turkey"? We do not recommend it. You should always coordinate with your prescribing physician to make the transition as smoothly as possible. Avoid stopping the medicine abruptly without consulting. We recommend a "slow taper".  What is a slow taper? Taper: refers to the gradual decrease in dose.   How do I stop/taper the dose? Slowly. Decrease the daily amount of pills that you take by one (1) pill every seven (7) days. This is called a "slow downward taper". Example: if you normally take four (4) pills per day, drop it to three (3) pills per day for seven (7) days, then to two (2) pills per day for seven (7) days, then to one (1) per day for seven (7) days, and then stop the medicine. The 14 day "Drug Holiday" starts on the first day without medicine.   Will I experience withdrawals? Unlikely with a slow taper.  What triggers withdrawals? Withdrawals are triggered by the sudden/abrupt stop of high dose opioids. Withdrawals can be avoided by slowly decreasing the dose over a prolonged period of time.  What are withdrawals? Symptoms associated with sudden/abrupt reduction/stopping of high-dose, long-term use of pain medication. Withdrawal are seldom seen on low dose therapy, or patients rarely taking opioid medication.  Early Withdrawal Symptoms may  include: Agitation Anxiety Muscle aches Increased tearing Insomnia Runny nose Sweating Yawning  Late symptoms may include: Abdominal cramping Diarrhea Dilated pupils Goose bumps Nausea Vomiting  When could I see withdrawals? Onset: 8-24 hours after last use for most opioids. 12-48 hours for long-acting opioids (i.e.: methadone)  How long could they last? Duration: 4-10 days for most opioids. 14-21 days for long-acting opioids (i.e.: methadone)  What will happen after I complete my "Drug Holiday"? The need and indications for the opioid analgesic will be reviewed before restarting the medication. Dose requirements will likely decrease and the dose will need to be adjusted accordingly.   (Last update: 12/22/2022) ____________________________________________________________________________________________    ____________________________________________________________________________________________  WARNING: CBD (cannabidiol) & Delta (Delta-8 tetrahydrocannabinol) products.   Applicable to:  All individuals currently taking or considering taking CBD (cannabidiol) and, more important, all patients taking opioid analgesic controlled substances (pain medication). (Example: oxycodone; oxymorphone; hydrocodone; hydromorphone; morphine; methadone; tramadol; tapentadol; fentanyl; buprenorphine; butorphanol; dextromethorphan; meperidine; codeine; etc.)  Introduction:  Recently there has been a drive towards the use of "natural" products for the treatment of different conditions, including pain anxiety and sleep disorders. Marijuana and hemp are two varieties of the cannabis genus plants. Marijuana and its derivatives are illegal, while hemp and its derivatives are not. Cannabidiol (CBD) and tetrahydrocannabinol (THC), are two natural compounds found in plants of the Cannabis genus. They can both be extracted from hemp or marijuana. Both compounds interact with your body's endocannabinoid  system in very different ways. CBD is associated with pain relief (analgesia) while THC is associated with the psychoactive effects ("the high") obtained from the use of marijuana products. There are two main types of THC: Delta-9, which comes from the marijuana plant and it is illegal, and Delta-8, which comes from the hemp plant, and it is legal. (Both, Delta-9-THC and Delta-8-THC are psychoactive and   give you "the high".)   Legality:  Marijuana and its derivatives: illegal Hemp and its derivatives: Legal (State dependent) UPDATE: (11/20/2021) The Drug Enforcement Agency (DEA) issued a letter stating that "delta" cannabinoids, including Delta-8-THCO and Delta-9-THCO, synthetically derived from hemp do not qualify as hemp and will be viewed as Schedule I drugs. (Schedule I drugs, substances, or chemicals are defined as drugs with no currently accepted medical use and a high potential for abuse. Some examples of Schedule I drugs are: heroin, lysergic acid diethylamide (LSD), marijuana (cannabis), 3,4-methylenedioxymethamphetamine (ecstasy), methaqualone, and peyote.) (https://www.dea.gov)  Legal status of CBD in [place]:  "Conditionally Legal"  Reference: "FDA Regulation of Cannabis and Cannabis-Derived Products, Including Cannabidiol (CBD)" - https://www.fda.gov/news-events/public-health-focus/fda-regulation-cannabis-and-cannabis-derived-products-including-cannabidiol-cbd  Warning:  CBD is not FDA approved and has not undergo the same manufacturing controls as prescription drugs.  This means that the purity and safety of available CBD may be questionable. Most of the time, despite manufacturer's claims, it is contaminated with THC (delta-9-tetrahydrocannabinol - the chemical in marijuana responsible for the "HIGH").  When this is the case, the THC contaminant will trigger a positive urine drug screen (UDS) test for Marijuana (carboxy-THC).   The FDA recently put out a warning about 5 things that everyone  should be aware of regarding Delta-8 THC: Delta-8 THC products have not been evaluated or approved by the FDA for safe use and may be marketed in ways that put the public health at risk. The FDA has received adverse event reports involving delta-8 THC-containing products. Delta-8 THC has psychoactive and intoxicating effects. Delta-8 THC manufacturing often involve use of potentially harmful chemicals to create the concentrations of delta-8 THC claimed in the marketplace. The final delta-8 THC product may have potentially harmful by-products (contaminants) due to the chemicals used in the process. Manufacturing of delta-8 THC products may occur in uncontrolled or unsanitary settings, which may lead to the presence of unsafe contaminants or other potentially harmful substances. Delta-8 THC products should be kept out of the reach of children and pets.  NOTE: Because a positive UDS for any illicit substance is a violation of our medication agreement, your opioid analgesics (pain medicine) may be permanently discontinued.  MORE ABOUT CBD  General Information: CBD was discovered in 1940 and it is a derivative of the cannabis sativa genus plants (Marijuana and Hemp). It is one of the 113 identified substances found in Marijuana. It accounts for up to 40% of the plant's extract. As of 2018, preliminary clinical studies on CBD included research for the treatment of anxiety, movement disorders, and pain. CBD is available and consumed in multiple forms, including inhalation of smoke or vapor, as an aerosol spray, and by mouth. It may be supplied as an oil containing CBD, capsules, dried cannabis, or as a liquid solution. CBD is thought not to be as psychoactive as THC (delta-9-tetrahydrocannabinol - the chemical in marijuana responsible for the "HIGH"). Studies suggest that CBD may interact with different biological target receptors in the body, including cannabinoid and other neurotransmitter receptors. As of  2018 the mechanism of action for its biological effects has not been determined.  Side-effects  Adverse reactions: Dry mouth, diarrhea, decreased appetite, fatigue, drowsiness, malaise, weakness, sleep disturbances, and others.  Drug interactions:  CBD may interact with medications such as blood-thinners. CBD causes drowsiness on its own and it will increase drowsiness caused by other medications, including antihistamines (such as Benadryl), benzodiazepines (Xanax, Ativan, Valium), antipsychotics, antidepressants, opioids, alcohol and supplements such as kava, melatonin and St. John's Wort.    Other drug interactions: Brivaracetam (Briviact); Caffeine; Carbamazepine (Tegretol); Citalopram (Celexa); Clobazam (Onfi); Eslicarbazepine (Aptiom); Everolimus (Zostress); Lithium; Methadone (Dolophine); Rufinamide (Banzel); Sedative medications (CNS depressants); Sirolimus (Rapamune); Stiripentol (Diacomit); Tacrolimus (Prograf); Tamoxifen ; Soltamox); Topiramate (Topamax); Valproate; Warfarin (Coumadin); Zonisamide. (Last update: 09/13/2022) ____________________________________________________________________________________________   ____________________________________________________________________________________________  Naloxone Nasal Spray  Why am I receiving this medication? [place] STOP ACT requires that all patients taking high dose opioids or at risk of opioids respiratory depression, be prescribed an opioid reversal agent, such as Naloxone (AKA: Narcan).  What is this medication? NALOXONE (nal OX one) treats opioid overdose, which causes slow or shallow breathing, severe drowsiness, or trouble staying awake. Call emergency services after using this medication. You may need additional treatment. Naloxone works by reversing the effects of opioids. It belongs to a group of medications called opioid blockers.  COMMON BRAND NAME(S): Kloxxado, Narcan  What should I tell my care team before  I take this medication? They need to know if you have any of these conditions: Heart disease Substance use disorder An unusual or allergic reaction to naloxone, other medications, foods, dyes, or preservatives Pregnant or trying to get pregnant Breast-feeding  When to use this medication? This medication is to be used for the treatment of respiratory depression (less than 8 breaths per minute) secondary to opioid overdose.   How to use this medication? This medication is for use in the nose. Lay the person on their back. Support their neck with your hand and allow the head to tilt back before giving the medication. The nasal spray should be given into 1 nostril. After giving the medication, move the person onto their side. Do not remove or test the nasal spray until ready to use. Get emergency medical help right away after giving the first dose of this medication, even if the person wakes up. You should be familiar with how to recognize the signs and symptoms of a narcotic overdose. If more doses are needed, give the additional dose in the other nostril. Talk to your care team about the use of this medication in children. While this medication may be prescribed for children as young as newborns for selected conditions, precautions do apply.  Naloxone Overdosage: If you think you have taken too much of this medicine contact a poison control center or emergency room at once.  NOTE: This medicine is only for you. Do not share this medicine with others.  What if I miss a dose? This does not apply.  What may interact with this medication? This is only used during an emergency. No interactions are expected during emergency use. This list may not describe all possible interactions. Give your health care provider a list of all the medicines, herbs, non-prescription drugs, or dietary supplements you use. Also tell them if you smoke, drink alcohol, or use illegal drugs. Some items may interact with  your medicine.  What should I watch for while using this medication? Keep this medication ready for use in the case of an opioid overdose. Make sure that you have the phone number of your care team and local hospital ready. You may need to have additional doses of this medication. Each nasal spray contains a single dose. Some emergencies may require additional doses. After use, bring the treated person to the nearest hospital or call 911. Make sure the treating care team knows that the person has received a dose of this medication. You will receive additional instructions on what to do during and after use of this   medication before an emergency occurs.  What side effects may I notice from receiving this medication? Side effects that you should report to your care team as soon as possible: Allergic reactions--skin rash, itching, hives, swelling of the face, lips, tongue, or throat Side effects that usually do not require medical attention (report these to your care team if they continue or are bothersome): Constipation Dryness or irritation inside the nose Headache Increase in blood pressure Muscle spasms Stuffy nose Toothache This list may not describe all possible side effects. Call your doctor for medical advice about side effects. You may report side effects to FDA at 1-800-FDA-1088.  Where should I keep my medication? Because this is an emergency medication, you should keep it with you at all times.  Keep out of the reach of children and pets. Store between 20 and 25 degrees C (68 and 77 degrees F). Do not freeze. Throw away any unused medication after the expiration date. Keep in original box until ready to use.  NOTE: This sheet is a summary. It may not cover all possible information. If you have questions about this medicine, talk to your doctor, pharmacist, or health care provider.   2023 [employer] Standard (2021-05-29  00:00:00)  ____________________________________________________________________________________________   

## 2023-01-24 NOTE — Progress Notes (Signed)
Nursing Pain Medication Assessment:  Safety precautions to be maintained throughout the outpatient stay will include: orient to surroundings, keep bed in low position, maintain call bell within reach at all times, provide assistance with transfer out of bed and ambulation.  Medication Inspection Compliance: Caleb Edwards did not comply with our request to bring his pills to be counted. He was reminded that bringing the medication bottles, even when empty, is a requirement.  Medication: None brought in. Pill/Patch Count: None available to be counted. Bottle Appearance: No container available. Did not bring bottle(s) to appointment. Filled Date: N/A Last Medication intake:  Today 

## 2023-01-25 ENCOUNTER — Other Ambulatory Visit (HOSPITAL_BASED_OUTPATIENT_CLINIC_OR_DEPARTMENT_OTHER): Payer: Medicare HMO | Admitting: Pain Medicine

## 2023-01-25 DIAGNOSIS — M545 Low back pain, unspecified: Secondary | ICD-10-CM

## 2023-01-25 DIAGNOSIS — M79604 Pain in right leg: Secondary | ICD-10-CM

## 2023-01-25 DIAGNOSIS — M47816 Spondylosis without myelopathy or radiculopathy, lumbar region: Secondary | ICD-10-CM

## 2023-01-25 DIAGNOSIS — M5416 Radiculopathy, lumbar region: Secondary | ICD-10-CM

## 2023-01-25 DIAGNOSIS — Z79899 Other long term (current) drug therapy: Secondary | ICD-10-CM

## 2023-01-25 DIAGNOSIS — F1120 Opioid dependence, uncomplicated: Secondary | ICD-10-CM

## 2023-01-25 DIAGNOSIS — M79605 Pain in left leg: Secondary | ICD-10-CM

## 2023-01-25 DIAGNOSIS — G894 Chronic pain syndrome: Secondary | ICD-10-CM

## 2023-01-25 DIAGNOSIS — G8929 Other chronic pain: Secondary | ICD-10-CM

## 2023-01-25 DIAGNOSIS — M25552 Pain in left hip: Secondary | ICD-10-CM

## 2023-01-25 DIAGNOSIS — M542 Cervicalgia: Secondary | ICD-10-CM

## 2023-01-25 DIAGNOSIS — M25551 Pain in right hip: Secondary | ICD-10-CM

## 2023-01-25 DIAGNOSIS — M7918 Myalgia, other site: Secondary | ICD-10-CM

## 2023-01-25 MED ORDER — TRAMADOL HCL 50 MG PO TABS: 100.0000 mg | ORAL_TABLET | Freq: Four times a day (QID) | ORAL | 4 refills | Status: DC

## 2023-01-25 NOTE — Progress Notes (Signed)
The patient came in today to bring his pills for the pill count.  Everything seems to be within normal limits.  I will be sending the rest of his prescriptions to the pharmacy.  RTCB: 07/23/2023

## 2023-01-25 NOTE — Progress Notes (Signed)
Nursing Pain Medication Assessment:  Safety precautions to be maintained throughout the outpatient stay will include: orient to surroundings, keep bed in low position, maintain call bell within reach at all times, provide assistance with transfer out of bed and ambulation.  Medication Inspection Compliance: Pill count conducted under aseptic conditions, in front of the patient. Neither the pills nor the bottle was removed from the patient's sight at any time. Once count was completed pills were immediately returned to the patient in their original bottle.  Medication: Tramadol (Ultram) Pill/Patch Count:  10 of 240 pills remain Pill/Patch Appearance: Markings consistent with prescribed medication Bottle Appearance: Standard pharmacy container. Clearly labeled. Filled Date: 03 / 21 / 2024 Last Medication intake:  Today

## 2023-01-26 ENCOUNTER — Encounter: Payer: Medicare HMO | Admitting: Pain Medicine

## 2023-02-01 ENCOUNTER — Ambulatory Visit: Payer: Medicare HMO | Admitting: Pain Medicine

## 2023-07-14 ENCOUNTER — Ambulatory Visit: Payer: Medicare HMO | Admitting: Urology

## 2023-07-19 NOTE — Progress Notes (Unsigned)
PROVIDER NOTE: Information contained herein reflects review and annotations entered in association with encounter. Interpretation of such information and data should be left to medically-trained personnel. Information provided to patient can be located elsewhere in the medical record under "Patient Instructions". Document created using STT-dictation technology, any transcriptional errors that may result from process are unintentional.    Patient: Caleb Edwards  Service Category: E/M  Provider: Oswaldo Done, MD  DOB: May 14, 1951  DOS: 07/20/2023  Referring Provider: Diana Eves, MD  MRN: [id number]  Specialty: Interventional Pain Management  PCP: Diana Eves, MD  Type: Established Patient  Setting: Ambulatory outpatient    Location: Office  Delivery: Face-to-face     HPI  Caleb Edwards, a 72 y.o. year old male, is here today because of his Chronic pain syndrome [G89.4]. 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); Chronic neck pain; Cervical spondylosis; Chronic pain syndrome; Abdominal pain; Grade 1 Anterolisthesis (7-80mm) of L5 over S1; DDD (degenerative disc disease), lumbosacral; Lumbar facet arthropathy; Chronic hip pain (2ry area of Pain) (Bilateral) (R>L); Cervical radiculopathy (Left); Cervicalgia; DDD (degenerative disc disease), cervical; Abnormal x-ray of cervical spine; 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); Cervical facet arthropathy (Right: C4-5, C5-6); Grade 1 Anterolisthesis of cervical spine (1-2 mm) (C4/C5); Cervical foraminal stenosis (Right: C5-C8) (Left:  C6-C7); Falls, sequela; Spondylosis without myelopathy or radiculopathy, lumbosacral region; Cervical radiculopathy (Right); Cervical spondylosis with radiculopathy; Abnormal MRI, cervical spine (11/28/2020); 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 Pain Assessment: Severity of Chronic pain is reported as a 10-Worst pain ever/10. Location: Back Lower/radiates down both legs to both feet -top and botton. Onset: More than a month ago. Quality: Constant, Dull, Burning. Timing: Constant. Modifying factor(s): denies. Vitals:  height is 6' (1.829 m) and weight is 180 lb (81.6 kg). His temperature is 97.2 F (36.2 C) (abnormal). His blood pressure is 138/75 and his pulse is 61. His oxygen saturation is 100%.  BMI: Estimated body mass index is 24.41 kg/m as calculated from the following:   Height as of this encounter: 6' (1.829 m).   Weight as of this encounter: 180 lb (81.6 kg). Last encounter: 01/24/2023. Last procedure: 01/06/2023.  Reason for encounter: medication management.  The patient requested this visit to also discuss his low back pain.  He has a lumbar spine MRI done on 12/16/2022 with an L1 to right paracentral disc extrusion displacing the cauda equina nerve roots with a right sided descending nerve root impingement additionally migrating downward towards the left likely to result in descending left-sided nerve root impingement and moderate left neural foraminal stenosis.  In addition he has moderate to severe central spinal stenosis at L3-4 with severe bilateral neuroforaminal stenosis (R>L) at that same level.  At the L4-5 level there is severe central spinal stenosis with severe bilateral neuroforaminal stenosis, with similar findings at the L5-S1 level.  Based on those  findings, I referred the patient to Dr. Volanda Napoleon (neurosurgeon) on 12/20/2022 for evaluation and possible decompression.  This patient had  also been referred to Dr. Marcell Barlow on 11/11/2020 for evaluation of similar problems in the cervical spine.  Today the patient indicates that he did not go to see Dr. Marcell Barlow because he is trying to avoid surgery.  Today I have reviewed the results of his MRI and I have explained those to the patient in layman's terms.  I have also explained to him that he has several areas of severe spinal stenosis as well as a disc extrusion close to the cauda equina which has the potential to compress the cauda equina causing neurogenic bowel and bladder.  He denies any of those symptoms at this time but he does admit that when he first wakes up in the morning, he experiences a significant amount of bilateral lower extremity weakness where he has difficulty even standing up.  I have pointed out to the patient that this is possibly due to positional changes in his canal which may lead to further closing of the stenosis affecting the blood flow to the spinal cord and nerve roots leading to the pain, numbness, and weakness that he is referring to.  He was thinking that this was secondary to blood flow of the lower extremities but he denies taking any kind of blood thinners or having an official diagnosis of any type of peripheral vascular disease.  What he does have his clear evidence on his Lumbar MRI of central and foraminal stenosis at several levels.  Today I have encouraged him to again contact the neurosurgeon to at least get some information from them as to what is it that he is looking at.  I have tried to stress the importance of dealing with this at this time when things are likely to be reversible as opposed to considering surgery once the changes become a reversible.  In addition, I have pointed out to him that should he begin to experience sudden bowel and bladder  problems, he needs to immediately go to the emergency room to be evaluated since compression of the cauda equina with resultant neurogenic bowel and bladder may become permanent and irreversible very quickly.  He understood and accepted.  Today he also indicates that he is tramadol is not working as well as it used to and I have pointed out that this may be either secondary to worsening of his condition, as shown on his MRI, or secondary to the development of tolerance.  He was reminded that in the case of tolerance he needs to taper down and completely stop the tramadol for a period of 2 to 3 weeks in order to get rid of this tolerance and to again have the medication work as well as it used to.  The patient has asked about the pain during the time that he is not taking the tramadol I have offered him an epidural steroid injection to help with this while he is off of the medicine.  He has decided to schedule that epidural.  He is still very reluctant to go and see the neurosurgeon.  Today he was accompanied by his wife who also understood and accepted.  Routine UDS ordered today.   RTCB: 12/20/2023   Pharmacotherapy Assessment  Analgesic: Tramadol 50 mg, 2 tabs PO q 6 hrs (400 mg/day of tramadol) MME/day: 40 mg/day.   Monitoring: [place] PMP: PDMP reviewed during this encounter.       Pharmacotherapy: No side-effects or adverse reactions reported. Compliance: No problems identified. Effectiveness: Clinically acceptable.  Florina Ou, RN  07/20/2023 10:44  AM  Sign when Signing Visit Safety precautions to be maintained throughout the outpatient stay will include: orient to surroundings, keep bed in low position, maintain call bell within reach at all times, provide assistance with transfer out of bed and ambulation.   Nursing Pain Medication Assessment:  Safety precautions to be maintained throughout the outpatient stay will include: orient to surroundings, keep bed in low position, maintain  call bell within reach at all times, provide assistance with transfer out of bed and ambulation.  Medication Inspection Compliance: Pill count conducted under aseptic conditions, in front of the patient. Neither the pills nor the bottle was removed from the patient's sight at any time. Once count was completed pills were immediately returned to the patient in their original bottle.  Medication: Tramadol (Ultram) Pill/Patch Count:  32 of 240 pills remain Pill/Patch Appearance: Markings consistent with prescribed medication Bottle Appearance: Standard pharmacy container. Clearly labeled. Filled Date: 39 / 19 / 2024 Last Medication intake:  Today    No results found for: "CBDTHCR" No results found for: "D8THCCBX" No results found for: "D9THCCBX"  UDS:  Summary  Date Value Ref Range Status  06/28/2022 Note  Final    Comment:    ==================================================================== [employer] Select 13 (MW) ==================================================================== Test                             Result       Flag       Units  Drug Present and Declared for Prescription Verification   Tramadol                       >11111       EXPECTED   ng/mg creat   O-Desmethyltramadol            >11111       EXPECTED   ng/mg creat   N-Desmethyltramadol            2920         EXPECTED   ng/mg creat    Source of tramadol is a prescription medication. O-desmethyltramadol    and N-desmethyltramadol are expected metabolites of tramadol.  ==================================================================== Test                      Result    Flag   Units      Ref Range   Creatinine              45               mg/dL      >=16 ==================================================================== Declared Medications:  The flagging and interpretation on this report are based on the  following declared medications.  Unexpected results may arise from  inaccuracies in the declared  medications.   **Note: The testing scope of this panel includes these medications:   Tramadol (Ultram)   **Note: The testing scope of this panel does not include the  following reported medications:   Amlodipine (Norvasc)  Chlorthalidone (Hygroton)  Ibuprofen (Advil)  Iron  Lisinopril (Zestril)  Pantoprazole (Protonix)  Rosuvastatin (Crestor)  Sildenafil (Viagra)  Trazodone (Desyrel) ==================================================================== For clinical consultation, please call [phone]. ====================================================================       ROS  Constitutional: Denies any fever or chills Gastrointestinal: No reported hemesis, hematochezia, vomiting, or acute GI distress Musculoskeletal: Denies any acute onset joint swelling, redness, loss of ROM, or weakness Neurological: No reported episodes of acute  onset apraxia, aphasia, dysarthria, agnosia, amnesia, paralysis, loss of coordination, or loss of consciousness  Medication Review  acetaminophen, amLODipine, chlorthalidone, ferrous sulfate, gabapentin, ibuprofen, lisinopril, naloxone, pantoprazole, rosuvastatin, sildenafil, traMADol, and traZODone  History Review  Allergy: Caleb Edwards has No Known Allergies. Drug: Caleb Edwards  reports no history of drug use. Alcohol:  reports current alcohol use of about 1.0 standard drink of alcohol per week. Tobacco:  reports that he has quit smoking. He has been exposed to tobacco smoke. He has never used smokeless tobacco. Social: Caleb Edwards  reports that he has quit smoking. He has been exposed to tobacco smoke. He has never used smokeless tobacco. He reports current alcohol use of about 1.0 standard drink of alcohol per week. He reports that he does not use drugs. Medical:  has a past medical history of Back pain, Blood transfusion without reported diagnosis (12/2015), Diverticulitis, Hypertension, Kidney stone, and Ulcer, stomach peptic,  chronic. Surgical: Caleb Edwards  has a past surgical history that includes Esophagogastroduodenoscopy (egd) with propofol (N/A, 01/03/2016); Colonoscopy with propofol (N/A, 08/02/2017); Esophagogastroduodenoscopy (egd) with propofol (08/02/2017); Esophagogastroduodenoscopy (egd) with propofol (N/A, 02/05/2018); Colonoscopy with propofol (N/A, 02/05/2018); Esophagogastroduodenoscopy (egd) with propofol (N/A, 03/30/2018); Esophagogastroduodenoscopy (egd) with propofol (N/A, 08/09/2019); Colonoscopy with propofol (N/A, 08/09/2019); Givens capsule study (N/A, 04/24/2020); Esophagogastroduodenoscopy (egd) with propofol (N/A, 04/24/2020); and Maxillary antrostomy (Left, 12/10/2022). Family: family history includes Diabetes in his mother; Heart disease in his father; Prostate cancer in his brother.  Laboratory Chemistry Profile   Renal Lab Results  Component Value Date   BUN 18 02/21/2020   CREATININE 1.24 02/21/2020   GFRAA >60 02/21/2020   GFRNONAA 60 (L) 02/21/2020    Hepatic Lab Results  Component Value Date   AST 23 08/09/2019   ALT 15 08/09/2019   ALBUMIN 3.8 08/09/2019   ALKPHOS 53 08/09/2019   LIPASE 17 08/08/2019    Electrolytes Lab Results  Component Value Date   NA 138 08/09/2019   K 3.9 08/09/2019   CL 106 08/09/2019   CALCIUM 8.7 (L) 08/09/2019   MG 1.9 01/04/2016    Bone Lab Results  Component Value Date   VD25OH 24.0 (L) 12/26/2015    Inflammation (CRP: Acute Phase) (ESR: Chronic Phase) Lab Results  Component Value Date   CRP 0.6 12/26/2015   ESRSEDRATE 3 12/26/2015         Note: Above Lab results reviewed.  Recent Imaging Review  DG PAIN CLINIC C-ARM 1-60 MIN NO REPORT Fluoro was used, but no Radiologist interpretation will be provided.  Please refer to "NOTES" tab for provider progress note. Note: Reviewed        Physical Exam  General appearance: Well nourished, well developed, and well hydrated. In no apparent acute distress Mental status: Alert, oriented x 3  (person, place, & time)       Respiratory: No evidence of acute respiratory distress Eyes: PERLA Vitals: BP 138/75   Pulse 61   Temp (!) 97.2 F (36.2 C)   Ht 6' (1.829 m)   Wt 180 lb (81.6 kg)   SpO2 100%   BMI 24.41 kg/m  BMI: Estimated body mass index is 24.41 kg/m as calculated from the following:   Height as of this encounter: 6' (1.829 m).   Weight as of this encounter: 180 lb (81.6 kg). Ideal: Ideal body weight: 77.6 kg (171 lb 1.2 oz) Adjusted ideal body weight: 79.2 kg (174 lb 10.3 oz)  Assessment   Diagnosis Status  1. Chronic pain syndrome  2. Chronic low back pain (1ry area of Pain) (Bilateral) (R>L) w/o sciatica   3. Chronic hip pain (2ry area of Pain) (Bilateral) (R>L)   4. Chronic lower extremity pain (3ry area of Pain) (Bilateral) (R>L)   5. Chronic radicular pain of lower back   6. Chronic lumbar radicular pain (Left) (L4 Dermatome)   7. L4-5 disc bulge   8. Degeneration of intervertebral disc of lumbosacral region, unspecified whether pain present   9. Grade 1 Anterolisthesis (7-53mm) of L5 over S1   10. Lumbar facet arthropathy   11. Lumbar facet joint pain   12. Lumbar facet syndrome (Bilateral) (R>L)   13. Lumbar spondylosis   14. Spondylosis without myelopathy or radiculopathy, lumbosacral region   15. Abnormal MRI, lumbar spine (12/16/2022)   16. Diffuse myofascial pain syndrome   17. Chronic neck pain   18. Pharmacologic therapy   19. Chronic use of opiate for therapeutic purpose   20. Uncomplicated opioid dependence (HCC)   21. Encounter for medication management   22. Encounter for chronic pain management    Controlled Controlled Controlled   Updated Problems: Problem  Chronic Radicular Pain of Lower Back  Coccygodynia    Plan of Care  Problem-specific:  No problem-specific Assessment & Plan notes found for this encounter.  Caleb Edwards has a current medication list which includes the following long-term medication(s):  chlorthalidone, lisinopril, pantoprazole, rosuvastatin, trazodone, gabapentin, and [START ON 07/23/2023] tramadol.  Pharmacotherapy (Medications Ordered): Meds ordered this encounter  Medications   traMADol (ULTRAM) 50 MG tablet    Sig: Take 2 tablets (100 mg total) by mouth every 6 (six) hours. Each refill must last 30 days.    Dispense:  240 tablet    Refill:  4    DO NOT: delete (not duplicate); no partial-fill (will deny script to complete), no refill request (F/U required). DISPENSE: 1 day early if closed on fill date. WARN: No CNS-depressants within 8 hrs of med.   naloxone (NARCAN) nasal spray 4 mg/0.1 mL    Sig: Place 1 spray into the nose as needed for up to 365 doses (for opioid-induced respiratory depresssion). In case of emergency (overdose), spray once into each nostril. If no response within 3 minutes, repeat application and call 911.    Dispense:  1 each    Refill:  0    Instruct patient in proper use of device.   Orders:  Orders Placed This Encounter  Procedures   Lumbar Epidural Injection    Standing Status:   Future    Standing Expiration Date:   10/20/2023    Scheduling Instructions:     Procedure: Interlaminar Lumbar Epidural Steroid injection (LESI)  L1-2     Laterality: Midline     Sedation: Patient's choice.     Timeframe: ASAP    Order Specific Question:   Where will this procedure be performed?    Answer:   [facility]   [employer] Select 13 (MW), Urine    Volume: 30 ml(s). Minimum 3 ml of urine is needed. Document temperature of fresh sample. Indications: Long term (current) use of opiate analgesic (N82.956)    Order Specific Question:   Release to patient    Answer:   Immediate   Nursing Instructions:    1). STAT: UDS required today. 2). Make sure to document all opioids and benzodiazepines taken, including time of last intake. 3). If order is entered on a procedure day, make sure sample is obtained before any medications  are administered.    Follow-up plan:   Return for [facility]): (ML) L1-2 LESI #1.      Interventional Therapies  Risk Factors  Considerations:   Stage3 CKD  HTN  Hx GI Bleed  Anemia w/ near syncope   Planned  Pending:   Diagnostic/therapeutic L1-2 LESI #1    Under consideration:   Diagnostic/therapeutic L1-2 LESI #1  Diagnostic right IA hip joint injection  Diagnostic bilateral lumbar facet block  Possible bilateral lumbar facet RFA    Completed:   Diagnostic/therapeutic left L4 TFESI x3 + left L5-S1 LESI x1 (01/06/2023) (100/100/100/95) (LLEP:100  LBP:95)  Therapeutic midline C6-7 interspinous ligament inj. x1 (06/28/2022) (80/90/75/75)  Diagnostic bilateral lumbar facet MBB x2 (12/02/2022) (100/100/0/0) did not help his left leg pain. Therapeutic left cervical ESI x2 (02/05/2021) (100/100/70 x 10 days/50)  Therapeutic right cervical ESI x1 (12/15/2021) (90/90/90/90)  Therapeutic left L3-4 LESI x2 (06/13/2018) (100/100/85/85)  Therapeutic left L3 TFESI x1 (03/13/2020) (95/95/60/>50)  Therapeutic left L4 TFESI x2 (03/13/2020) (95/95/60/>50)  Therapeutic right L3 TFESI x2 (03/13/2020) (95/95/60/>50)  Therapeutic right L4 TFESI x3 (03/13/2020) (95/95/60/>50)  Referral to neurosurgery Volanda Napoleon, MD) for cervical instability (11/11/2020)    Therapeutic  Palliative (PRN) options:   Palliative left L3-4 LESI   Palliative left L3 TFESI   Palliative left L4 TFESI   Palliative right L3 TFESI   Palliative right L4 TFESI        Recent Visits No visits were found meeting these conditions. Showing recent visits within past 90 days and meeting all other requirements Today's Visits Date Type Provider Dept  07/20/23 Office Visit Delano Metz, MD [facility]  Showing today's visits and meeting all other requirements Future Appointments No visits were found meeting these conditions. Showing future appointments within next 90 days and meeting all other requirements  I discussed the  assessment and treatment plan with the patient. The patient was provided an opportunity to ask questions and all were answered. The patient agreed with the plan and demonstrated an understanding of the instructions.  Patient advised to call back or seek an in-person evaluation if the symptoms or condition worsens.  Duration of encounter: 36 minutes.  Total time on encounter, as per AMA guidelines included both the face-to-face and non-face-to-face time personally spent by the physician and/or other qualified health care professional(s) on the day of the encounter (includes time in activities that require the physician or other qualified health care professional and does not include time in activities normally performed by clinical staff). Physician's time may include the following activities when performed: Preparing to see the patient (e.g., pre-charting review of records, searching for previously ordered imaging, lab work, and nerve conduction tests) Review of prior analgesic pharmacotherapies. Reviewing PMP Interpreting ordered tests (e.g., lab work, imaging, nerve conduction tests) Performing post-procedure evaluations, including interpretation of diagnostic procedures Obtaining and/or reviewing separately obtained history Performing a medically appropriate examination and/or evaluation Counseling and educating the patient/family/caregiver Ordering medications, tests, or procedures Referring and communicating with other health care professionals (when not separately reported) Documenting clinical information in the electronic or other health record Independently interpreting results (not separately reported) and communicating results to the patient/ family/caregiver Care coordination (not separately reported)  Note by: Oswaldo Done, MD Date: 07/20/2023; Time: 12:35 PM

## 2023-07-20 ENCOUNTER — Encounter: Payer: Self-pay | Admitting: Pain Medicine

## 2023-07-20 ENCOUNTER — Ambulatory Visit: Payer: Medicare HMO | Attending: Pain Medicine | Admitting: Pain Medicine

## 2023-07-20 VITALS — BP 138/75 | HR 61 | Temp 97.2°F | Ht 72.0 in | Wt 180.0 lb

## 2023-07-20 DIAGNOSIS — M5416 Radiculopathy, lumbar region: Secondary | ICD-10-CM | POA: Diagnosis present

## 2023-07-20 DIAGNOSIS — M7918 Myalgia, other site: Secondary | ICD-10-CM

## 2023-07-20 DIAGNOSIS — M5459 Other low back pain: Secondary | ICD-10-CM | POA: Insufficient documentation

## 2023-07-20 DIAGNOSIS — M47816 Spondylosis without myelopathy or radiculopathy, lumbar region: Secondary | ICD-10-CM | POA: Insufficient documentation

## 2023-07-20 DIAGNOSIS — M545 Low back pain, unspecified: Secondary | ICD-10-CM | POA: Diagnosis present

## 2023-07-20 DIAGNOSIS — M51379 Other intervertebral disc degeneration, lumbosacral region without mention of lumbar back pain or lower extremity pain: Secondary | ICD-10-CM

## 2023-07-20 DIAGNOSIS — G8929 Other chronic pain: Secondary | ICD-10-CM | POA: Insufficient documentation

## 2023-07-20 DIAGNOSIS — M51369 Other intervertebral disc degeneration, lumbar region without mention of lumbar back pain or lower extremity pain: Secondary | ICD-10-CM | POA: Diagnosis present

## 2023-07-20 DIAGNOSIS — M47817 Spondylosis without myelopathy or radiculopathy, lumbosacral region: Secondary | ICD-10-CM

## 2023-07-20 DIAGNOSIS — M79604 Pain in right leg: Secondary | ICD-10-CM | POA: Insufficient documentation

## 2023-07-20 DIAGNOSIS — M25552 Pain in left hip: Secondary | ICD-10-CM

## 2023-07-20 DIAGNOSIS — G894 Chronic pain syndrome: Secondary | ICD-10-CM

## 2023-07-20 DIAGNOSIS — R937 Abnormal findings on diagnostic imaging of other parts of musculoskeletal system: Secondary | ICD-10-CM | POA: Diagnosis present

## 2023-07-20 DIAGNOSIS — Z79899 Other long term (current) drug therapy: Secondary | ICD-10-CM

## 2023-07-20 DIAGNOSIS — F1120 Opioid dependence, uncomplicated: Secondary | ICD-10-CM | POA: Insufficient documentation

## 2023-07-20 DIAGNOSIS — M25551 Pain in right hip: Secondary | ICD-10-CM

## 2023-07-20 DIAGNOSIS — Z79891 Long term (current) use of opiate analgesic: Secondary | ICD-10-CM

## 2023-07-20 DIAGNOSIS — M79605 Pain in left leg: Secondary | ICD-10-CM | POA: Diagnosis present

## 2023-07-20 DIAGNOSIS — M542 Cervicalgia: Secondary | ICD-10-CM

## 2023-07-20 DIAGNOSIS — M4726 Other spondylosis with radiculopathy, lumbar region: Secondary | ICD-10-CM

## 2023-07-20 DIAGNOSIS — M4310 Spondylolisthesis, site unspecified: Secondary | ICD-10-CM | POA: Insufficient documentation

## 2023-07-20 MED ORDER — NALOXONE HCL 4 MG/0.1ML NA LIQD
1.0000 | NASAL | 0 refills | Status: DC | PRN
Start: 2023-07-20 — End: 2024-07-02

## 2023-07-20 MED ORDER — TRAMADOL HCL 50 MG PO TABS: 100.0000 mg | ORAL_TABLET | Freq: Four times a day (QID) | ORAL | 4 refills | Status: DC

## 2023-07-20 NOTE — Progress Notes (Signed)
Safety precautions to be maintained throughout the outpatient stay will include: orient to surroundings, keep bed in low position, maintain call bell within reach at all times, provide assistance with transfer out of bed and ambulation.   Nursing Pain Medication Assessment:  Safety precautions to be maintained throughout the outpatient stay will include: orient to surroundings, keep bed in low position, maintain call bell within reach at all times, provide assistance with transfer out of bed and ambulation.  Medication Inspection Compliance: Pill count conducted under aseptic conditions, in front of the patient. Neither the pills nor the bottle was removed from the patient's sight at any time. Once count was completed pills were immediately returned to the patient in their original bottle.  Medication: Tramadol (Ultram) Pill/Patch Count:  32 of 240 pills remain Pill/Patch Appearance: Markings consistent with prescribed medication Bottle Appearance: Standard pharmacy container. Clearly labeled. Filled Date: 1 / 19 / 2024 Last Medication intake:  Today

## 2023-07-20 NOTE — Patient Instructions (Addendum)
______________________________________________________________________    Procedure instructions  Stop blood-thinners  Do not eat or drink fluids (other than water) for 6 hours before your procedure  No water for 2 hours before your procedure  Take your blood pressure medicine with a sip of water  Arrive 30 minutes before your appointment  If sedation is planned, bring suitable driver. [name], [name], & public transportation are NOT APPROVED)  Carefully read the "Preparing for your procedure" detailed instructions  If you have questions call us at [phone]  ______________________________________________________________________      ______________________________________________________________________    Preparing for your procedure  Appointments: If you think you may not be able to keep your appointment, call 24-48 hours in advance to cancel. We need time to make it available to others.  During your procedure appointment there will be: No Prescription Refills. No disability issues to discussed. No medication changes or discussions.  Instructions: Food intake: Avoid eating anything solid for at least 8 hours prior to your procedure. Clear liquid intake: You may take clear liquids such as water up to 2 hours prior to your procedure. (No carbonated drinks. No soda.) Transportation: Unless otherwise stated by your physician, bring a driver. (Driver cannot be a Market researcher, Pharmacist, community, or any other form of public transportation.) Morning Medicines: Except for blood thinners, take all of your other morning medications with a sip of water. Make sure to take your heart and blood pressure medicines. If your blood pressure's lower number is above 100, the case will be rescheduled. Blood thinners: Make sure to stop your blood thinners as instructed.  If you take a blood thinner, but were not instructed to stop it, call our office [phone] and ask to talk to a nurse. Not stopping a blood  thinner prior to certain procedures could lead to serious complications. Diabetics on insulin: Notify the staff so that you can be scheduled 1st case in the morning. If your diabetes requires high dose insulin, take only  of your normal insulin dose the morning of the procedure and notify the staff that you have done so. Preventing infections: Shower with an antibacterial soap the morning of your procedure.  Build-up your immune system: Take 1000 mg of Vitamin C with every meal (3 times a day) the day prior to your procedure. Antibiotics: Inform the nursing staff if you are taking any antibiotics or if you have any conditions that may require antibiotics prior to procedures. (Example: recent joint implants)   Pregnancy: If you are pregnant make sure to notify the nursing staff. Not doing so may result in injury to the fetus, including death.  Sickness: If you have a cold, fever, or any active infections, call and cancel or reschedule your procedure. Receiving steroids while having an infection may result in complications. Arrival: You must be in the facility at least 30 minutes prior to your scheduled procedure. Tardiness: Your scheduled time is also the cutoff time. If you do not arrive at least 15 minutes prior to your procedure, you will be rescheduled.  Children: Do not bring any children with you. Make arrangements to keep them home. Dress appropriately: There is always a possibility that your clothing may get soiled. Avoid long dresses. Valuables: Do not bring any jewelry or valuables.  Reasons to call and reschedule or cancel your procedure: (Following these recommendations will minimize the risk of a serious complication.) Surgeries: Avoid having procedures within 2 weeks of any surgery. (Avoid for 2 weeks before or after any surgery). Flu Shots:  Avoid having procedures within 2 weeks of a flu shots or . (Avoid for 2 weeks before or after immunizations). Barium: Avoid having a procedure  within 7-10 days after having had a radiological study involving the use of radiological contrast. (Myelograms, Barium swallow or enema study). Heart attacks: Avoid any elective procedures or surgeries for the initial 6 months after a "Myocardial Infarction" (Heart Attack). Blood thinners: It is imperative that you stop these medications before procedures. Let us know if you if you take any blood thinner.  Infection: Avoid procedures during or within two weeks of an infection (including chest colds or gastrointestinal problems). Symptoms associated with infections include: Localized redness, fever, chills, night sweats or profuse sweating, burning sensation when voiding, cough, congestion, stuffiness, runny nose, sore throat, diarrhea, nausea, vomiting, cold or Flu symptoms, recent or current infections. It is specially important if the infection is over the area that we intend to treat. Heart and lung problems: Symptoms that may suggest an active cardiopulmonary problem include: cough, chest pain, breathing difficulties or shortness of breath, dizziness, ankle swelling, uncontrolled high or unusually low blood pressure, and/or palpitations. If you are experiencing any of these symptoms, cancel your procedure and contact your primary care physician for an evaluation.  Remember:  Regular Business hours are:  Monday to Thursday 8:00 AM to 4:00 PM  Provider's Schedule: Delano Metz, MD:  Procedure days: Tuesday and Thursday 7:30 AM to 4:00 PM  Edward Jolly, MD:  Procedure days: Monday and Wednesday 7:30 AM to 4:00 PM Last  Updated: 05/24/2023 ______________________________________________________________________      ______________________________________________________________________    General Risks and Possible Complications  Patient Responsibilities: It is important that you read this as it is part of your informed consent. It is our duty to inform you of the risks and possible  complications associated with treatments offered to you. It is your responsibility as a patient to read this and to ask questions about anything that is not clear or that you believe was not covered in this document.  Patient's Rights: You have the right to refuse treatment. You also have the right to change your mind, even after initially having agreed to have the treatment done. However, under this last option, if you wait until the last second to change your mind, you may be charged for the materials used up to that point.  Introduction: Medicine is not an Visual merchandiser. Everything in Medicine, including the lack of treatment(s), carries the potential for danger, harm, or loss (which is by definition: Risk). In Medicine, a complication is a secondary problem, condition, or disease that can aggravate an already existing one. All treatments carry the risk of possible complications. The fact that a side effects or complications occurs, does not imply that the treatment was conducted incorrectly. It must be clearly understood that these can happen even when everything is done following the highest safety standards.  No treatment: You can choose not to proceed with the proposed treatment alternative. The "PRO(s)" would include: avoiding the risk of complications associated with the therapy. The "CON(s)" would include: not getting any of the treatment benefits. These benefits fall under one of three categories: diagnostic; therapeutic; and/or palliative. Diagnostic benefits include: getting information which can ultimately lead to improvement of the disease or symptom(s). Therapeutic benefits are those associated with the successful treatment of the disease. Finally, palliative benefits are those related to the decrease of the primary symptoms, without necessarily curing the condition (example: decreasing the pain from a flare-up  of a chronic condition, such as incurable terminal cancer).  General Risks and  Complications: These are associated to most interventional treatments. They can occur alone, or in combination. They fall under one of the following six (6) categories: no benefit or worsening of symptoms; bleeding; infection; nerve damage; allergic reactions; and/or death. No benefits or worsening of symptoms: In Medicine there are no guarantees, only probabilities. No healthcare provider can ever guarantee that a medical treatment will work, they can only state the probability that it may. Furthermore, there is always the possibility that the condition may worsen, either directly, or indirectly, as a consequence of the treatment. Bleeding: This is more common if the patient is taking a blood thinner, either prescription or over the counter (example: Goody Powders, Fish oil, Aspirin, Garlic, etc.), or if suffering a condition associated with impaired coagulation (example: Hemophilia, cirrhosis of the liver, low platelet counts, etc.). However, even if you do not have one on these, it can still happen. If you have any of these conditions, or take one of these drugs, make sure to notify your treating physician. Infection: This is more common in patients with a compromised immune system, either due to disease (example: diabetes, cancer, human immunodeficiency virus [HIV], etc.), or due to medications or treatments (example: therapies used to treat cancer and rheumatological diseases). However, even if you do not have one on these, it can still happen. If you have any of these conditions, or take one of these drugs, make sure to notify your treating physician. Nerve Damage: This is more common when the treatment is an invasive one, but it can also happen with the use of medications, such as those used in the treatment of cancer. The damage can occur to small secondary nerves, or to large primary ones, such as those in the spinal cord and brain. This damage may be temporary or permanent and it may lead to  impairments that can range from temporary numbness to permanent paralysis and/or brain death. Allergic Reactions: Any time a substance or material comes in contact with our body, there is the possibility of an allergic reaction. These can range from a mild skin rash (contact dermatitis) to a severe systemic reaction (anaphylactic reaction), which can result in death. Death: In general, any medical intervention can result in death, most of the time due to an unforeseen complication. ______________________________________________________________________      ______________________________________________________________________    Medication Rules  Purpose: To inform patients, and their family members, of our medication rules and regulations.  Applies to: All patients receiving prescriptions from our practice (written or electronic).  Pharmacy of record: This is the pharmacy where your electronic prescriptions will be sent. Make sure we have the correct one.  Electronic prescriptions: In compliance with the [facility] Strengthen Opioid Misuse Prevention (STOP) Act of 2017 (Session Conni Elliot [phone]), effective October 04, 2018, all controlled substances must be electronically prescribed. Written prescriptions, faxing, or calling prescriptions to a pharmacy will no longer be done.  Prescription refills: These will be provided only during in-person appointments. No medications will be renewed without a "face-to-face" evaluation with your provider. Applies to all prescriptions.  NOTE: The following applies primarily to controlled substances (Opioid* Pain Medications).   Type of encounter (visit): For patients receiving controlled substances, face-to-face visits are required. (Not an option and not up to the patient.)  Patient's responsibilities: Pain Pills: Bring all pain pills to every appointment (except for procedure appointments). Pill Bottles: Bring pills in original pharmacy bottle.  Bring bottle, even if empty. Always bring the bottle of the most recent fill.  Medication refills: You are responsible for knowing and keeping track of what medications you are taking and when is it that you will need a refill. The day before your appointment: write a list of all prescriptions that need to be refilled. The day of the appointment: give the list to the admitting nurse. Prescriptions will be written only during appointments. No prescriptions will be written on procedure days. If you forget a medication: it will not be "Called in", "Faxed", or "electronically sent". You will need to get another appointment to get these prescribed. No early refills. Do not call asking to have your prescription filled early. Partial  or short prescriptions: Occasionally your pharmacy may not have enough pills to fill your prescription.  NEVER ACCEPT a partial fill or a prescription that is short of the total amount of pills that you were prescribed.  With controlled substances the law allows 72 hours for the pharmacy to complete the prescription.  If the prescription is not completed within 72 hours, the pharmacist will require a new prescription to be written. This means that you will be short on your medicine and we WILL NOT send another prescription to complete your original prescription.  Instead, request the pharmacy to send a carrier to a nearby branch to get enough medication to provide you with your full prescription. Prescription Accuracy: You are responsible for carefully inspecting your prescriptions before leaving our office. Have the discharge nurse carefully go over each prescription with you, before taking them home. Make sure that your name is accurately spelled, that your address is correct. Check the name and dose of your medication to make sure it is accurate. Check the number of pills, and the written instructions to make sure they are clear and accurate. Make sure that you are given enough  medication to last until your next medication refill appointment. Taking Medication: Take medication as prescribed. When it comes to controlled substances, taking less pills or less frequently than prescribed is permitted and encouraged. Never take more pills than instructed. Never take the medication more frequently than prescribed.  Inform other Doctors: Always inform, all of your healthcare providers, of all the medications you take. Pain Medication from other Providers: You are not allowed to accept any additional pain medication from any other Doctor or Healthcare provider. There are two exceptions to this rule. (see below) In the event that you require additional pain medication, you are responsible for notifying us, as stated below. Cough Medicine: Often these contain an opioid, such as codeine or hydrocodone. Never accept or take cough medicine containing these opioids if you are already taking an opioid* medication. The combination may cause respiratory failure and death. Medication Agreement: You are responsible for carefully reading and following our Medication Agreement. This must be signed before receiving any prescriptions from our practice. Safely store a copy of your signed Agreement. Violations to the Agreement will result in no further prescriptions. (Additional copies of our Medication Agreement are available upon request.) Laws, Rules, & Regulations: All patients are expected to follow all [address] and [employer], Rules, [place] [employer]. Ignorance of the Laws does not constitute a valid excuse.  Illegal drugs and Controlled Substances: The use of illegal substances (including, but not limited to marijuana and its derivatives) and/or the illegal use of any controlled substances is strictly prohibited. Violation of this rule may result in the immediate and permanent discontinuation of  any and all prescriptions being written by our practice. The use of any illegal substances is  prohibited. Adopted CDC guidelines & recommendations: Target dosing levels will be at or below 60 MME/day. Use of benzodiazepines** is not recommended.  Exceptions: There are only two exceptions to the rule of not receiving pain medications from other Healthcare Providers. Exception #1 (Emergencies): In the event of an emergency (i.e.: accident requiring emergency care), you are allowed to receive additional pain medication. However, you are responsible for: As soon as you are able, call our office [phone], at any time of the day or night, and leave a message stating your name, the date and nature of the emergency, and the name and dose of the medication prescribed. In the event that your call is answered by a member of our staff, make sure to document and save the date, time, and the name of the person that took your information.  Exception #2 (Planned Surgery): In the event that you are scheduled by another doctor or dentist to have any type of surgery or procedure, you are allowed (for a period no longer than 30 days), to receive additional pain medication, for the acute post-op pain. However, in this case, you are responsible for picking up a copy of our "Post-op Pain Management for Surgeons" handout, and giving it to your surgeon or dentist. This document is available at our office, and does not require an appointment to obtain it. Simply go to our office during business hours (Monday-Thursday from 8:00 AM to 4:00 PM) (Friday 8:00 AM to 12:00 Noon) or if you have a scheduled appointment with [place], prior to your surgery, and ask for it by name. In addition, you are responsible for: calling our office [phone], at any time of the day or night, and leaving a message stating your name, name of your surgeon, type of surgery, and date of procedure or surgery. Failure to comply with your responsibilities may result in termination of therapy involving the controlled substances. Medication Agreement  Violation. Following the above rules, including your responsibilities will help you in avoiding a Medication Agreement Violation ("Breaking your Pain Medication Contract").  Consequences:  Not following the above rules may result in permanent discontinuation of medication prescription therapy.  *Opioid medications include: morphine, codeine, oxycodone, oxymorphone, hydrocodone, hydromorphone, meperidine, tramadol, tapentadol, buprenorphine, fentanyl, methadone. **Benzodiazepine medications include: diazepam (Valium), alprazolam (Xanax), clonazepam (Klonopine), lorazepam (Ativan), clorazepate (Tranxene), chlordiazepoxide (Librium), estazolam (Prosom), oxazepam (Serax), temazepam (Restoril), triazolam (Halcion) (Last updated: 07/27/2022) ______________________________________________________________________      ______________________________________________________________________    Medication Recommendations and Reminders  Applies to: All patients receiving prescriptions (written and/or electronic).  Medication Rules & Regulations: You are responsible for reading, knowing, and following our "Medication Rules" document. These exist for your safety and that of others. They are not flexible and neither are we. Dismissing or ignoring them is an act of "non-compliance" that may result in complete and irreversible termination of such medication therapy. For safety reasons, "non-compliance" will not be tolerated. As with the [place] fundamental legal principle of "ignorance of the law is no defense", we will accept no excuses for not having read and knowing the content of documents provided to you by our practice.  Pharmacy of record:  Definition: This is the pharmacy where your electronic prescriptions will be sent.  We do not endorse any particular pharmacy. It is up to you and your insurance to decide what pharmacy to use.  We do not restrict you in your choice  of pharmacy. However, once we write for  your prescriptions, we will NOT be re-sending more prescriptions to fix restricted supply problems created by your pharmacy, or your insurance.  The pharmacy listed in the electronic medical record should be the one where you want electronic prescriptions to be sent. If you choose to change pharmacy, simply notify our nursing staff. Changes will be made only during your regular appointments and not over the phone.  Recommendations: Keep all of your pain medications in a safe place, under lock and key, even if you live alone. We will NOT replace lost, stolen, or damaged medication. We do not accept "Police Reports" as proof of medications having been stolen. After you fill your prescription, take 1 week's worth of pills and put them away in a safe place. You should keep a separate, properly labeled bottle for this purpose. The remainder should be kept in the original bottle. Use this as your primary supply, until it runs out. Once it's gone, then you know that you have 1 week's worth of medicine, and it is time to come in for a prescription refill. If you do this correctly, it is unlikely that you will ever run out of medicine. To make sure that the above recommendation works, it is very important that you make sure your medication refill appointments are scheduled at least 1 week before you run out of medicine. To do this in an effective manner, make sure that you do not leave the office without scheduling your next medication management appointment. Always ask the nursing staff to show you in your prescription , when your medication will be running out. Then arrange for the receptionist to get you a return appointment, at least 7 days before you run out of medicine. Do not wait until you have 1 or 2 pills left, to come in. This is very poor planning and does not take into consideration that we may need to cancel appointments due to bad weather, sickness, or emergencies affecting our staff. DO NOT ACCEPT A  "Partial Fill": If for any reason your pharmacy does not have enough pills/tablets to completely fill or refill your prescription, do not allow for a "partial fill". The law allows the pharmacy to complete that prescription within 72 hours, without requiring a new prescription. If they do not fill the rest of your prescription within those 72 hours, you will need a separate prescription to fill the remaining amount, which we will NOT provide. If the reason for the partial fill is your insurance, you will need to talk to the pharmacist about payment alternatives for the remaining tablets, but again, DO NOT ACCEPT A PARTIAL FILL, unless you can trust your pharmacist to obtain the remainder of the pills within 72 hours.  Prescription refills and/or changes in medication(s):  Prescription refills, and/or changes in dose or medication, will be conducted only during scheduled medication management appointments. (Applies to both, written and electronic prescriptions.) No refills on procedure days. No medication will be changed or started on procedure days. No changes, adjustments, and/or refills will be conducted on a procedure day. Doing so will interfere with the diagnostic portion of the procedure. No phone refills. No medications will be "called into the pharmacy". No Fax refills. No weekend refills. No Holliday refills. No after hours refills.  Remember:  Business hours are:  Monday to Thursday 8:00 AM to 4:00 PM Provider's Schedule: Delano Metz, MD - Appointments are:  Medication management: Monday and Wednesday 8:00 AM to 4:00  PM Procedure day: Tuesday and Thursday 7:30 AM to 4:00 PM Edward Jolly, MD - Appointments are:  Medication management: Tuesday and Thursday 8:00 AM to 4:00 PM Procedure day: Monday and Wednesday 7:30 AM to 4:00 PM (Last update: 07/27/2022) ______________________________________________________________________       ______________________________________________________________________    Opioid Pain Medication Update  To: All patients taking opioid pain medications. (I.e.: hydrocodone, hydromorphone, oxycodone, oxymorphone, morphine, codeine, methadone, tapentadol, tramadol, buprenorphine, fentanyl, etc.)  Re: Updated review of side effects and adverse reactions of opioid analgesics, as well as new information about long term effects of this class of medications.  Direct risks of long-term opioid therapy are not limited to opioid addiction and overdose. Potential medical risks include serious fractures, breathing problems during sleep, hyperalgesia, immunosuppression, chronic constipation, bowel obstruction, myocardial infarction, and tooth decay secondary to xerostomia.  Unpredictable adverse effects that can occur even if you take your medication correctly: Cognitive impairment, respiratory depression, and death. Most people think that if they take their medication "correctly", and "as instructed", that they will be safe. Nothing could be farther from the truth. In reality, a significant amount of recorded deaths associated with the use of opioids has occurred in individuals that had taken the medication for a long time, and were taking their medication correctly. The following are examples of how this can happen: Patient taking his/her medication for a long time, as instructed, without any side effects, is given a certain antibiotic or another unrelated medication, which in turn triggers a "Drug-to-drug interaction" leading to disorientation, cognitive impairment, impaired reflexes, respiratory depression or an untoward event leading to serious bodily harm or injury, including death.  Patient taking his/her medication for a long time, as instructed, without any side effects, develops an acute impairment of liver and/or kidney function. This will lead to a rapid inability of the body to breakdown and eliminate  their pain medication, which will result in effects similar to an "overdose", but with the same medicine and dose that they had always taken. This again may lead to disorientation, cognitive impairment, impaired reflexes, respiratory depression or an untoward event leading to serious bodily harm or injury, including death.  A similar problem will occur with patients as they grow older and their liver and kidney function begins to decrease as part of the aging process.  Background information: Historically, the original case for using long-term opioid therapy to treat chronic noncancer pain was based on safety assumptions that subsequent experience has called into question. In 1996, the American Pain Society and the American Academy of Pain Medicine issued a consensus statement supporting long-term opioid therapy. This statement acknowledged the dangers of opioid prescribing but concluded that the risk for addiction was low; respiratory depression induced by opioids was short-lived, occurred mainly in opioid-naive patients, and was antagonized by pain; tolerance was not a common problem; and efforts to control diversion should not constrain opioid prescribing. This has now proven to be wrong. Experience regarding the risks for opioid addiction, misuse, and overdose in community practice has failed to support these assumptions.  According to the Centers for Disease Control and Prevention, fatal overdoses involving opioid analgesics have increased sharply over the past decade. Currently, more than 96,700 people die from drug overdoses every year. Opioids are a factor in 7 out of every 10 overdose deaths. Deaths from drug overdose have surpassed motor vehicle accidents as the leading cause of death for individuals between the ages of 51 and 66.  Clinical data suggest that neuroendocrine dysfunction may  be very common in both men and women, potentially causing hypogonadism, erectile dysfunction, infertility,  decreased libido, osteoporosis, and depression. Recent studies linked higher opioid dose to increased opioid-related mortality. Controlled observational studies reported that long-term opioid therapy may be associated with increased risk for cardiovascular events. Subsequent meta-analysis concluded that the safety of long-term opioid therapy in elderly patients has not been proven.   Side Effects and adverse reactions: Common side effects: Drowsiness (sedation). Dizziness. Nausea and vomiting. Constipation. Physical dependence -- Dependence often manifests with withdrawal symptoms when opioids are discontinued or decreased. Tolerance -- As you take repeated doses of opioids, you require increased medication to experience the same effect of pain relief. Respiratory depression -- This can occur in healthy people, especially with higher doses. However, people with COPD, asthma or other lung conditions may be even more susceptible to fatal respiratory impairment.  Uncommon side effects: An increased sensitivity to feeling pain and extreme response to pain (hyperalgesia). Chronic use of opioids can lead to this. Delayed gastric emptying (the process by which the contents of your stomach are moved into your small intestine). Muscle rigidity. Immune system and hormonal dysfunction. Quick, involuntary muscle jerks (myoclonus). Arrhythmia. Itchy skin (pruritus). Dry mouth (xerostomia).  Long-term side effects: Chronic constipation. Sleep-disordered breathing (SDB). Increased risk of bone fractures. Hypothalamic-pituitary-adrenal dysregulation. Increased risk of overdose.  RISKS: Respiratory depression and death: Opioids increase the risk of respiratory depression and death.  Drug-to-drug interactions: Opioids are relatively contraindicated in combination with benzodiazepines, sleep inducers, and other central nervous system depressants. Other classes of medications (i.e.: certain antibiotics  and even over-the-counter medications) may also trigger or induce respiratory depression in some patients.  Medical conditions: Patients with pre-existing respiratory problems are at higher risk of respiratory failure and/or depression when in combination with opioid analgesics. Opioids are relatively contraindicated in some medical conditions such as central sleep apnea.   Fractures and Falls:  Opioids increase the risk and incidence of falls. This is of particular importance in elderly patients.  Endocrine System:  Long-term administration is associated with endocrine abnormalities (endocrinopathies). (Also known as Opioid-induced Endocrinopathy) Influences on both the hypothalamic-pituitary-adrenal axis?and the hypothalamic-pituitary-gonadal axis have been demonstrated with consequent hypogonadism and adrenal insufficiency in both sexes. Hypogonadism and decreased levels of dehydroepiandrosterone sulfate have been reported in men and women. Endocrine effects include: Amenorrhoea in women (abnormal absence of menstruation) Reduced libido in both sexes Decreased sexual function Erectile dysfunction in men Hypogonadisms (decreased testicular function with shrinkage of testicles) Infertility Depression and fatigue Loss of muscle mass Anxiety Depression Immune suppression Hyperalgesia Weight gain Anemia Osteoporosis Patients (particularly women of childbearing age) should avoid opioids. There is insufficient evidence to recommend routine monitoring of asymptomatic patients taking opioids in the long-term for hormonal deficiencies.  Immune System: Human studies have demonstrated that opioids have an immunomodulating effect. These effects are mediated via opioid receptors both on immune effector cells and in the central nervous system. Opioids have been demonstrated to have adverse effects on antimicrobial response and anti-tumour surveillance. Buprenorphine has been demonstrated to have  no impact on immune function.  Opioid Induced Hyperalgesia: Human studies have demonstrated that prolonged use of opioids can lead to a state of abnormal pain sensitivity, sometimes called opioid induced hyperalgesia (OIH). Opioid induced hyperalgesia is not usually seen in the absence of tolerance to opioid analgesia. Clinically, hyperalgesia may be diagnosed if the patient on long-term opioid therapy presents with increased pain. This might be qualitatively and anatomically distinct from pain related to disease progression or  to breakthrough pain resulting from development of opioid tolerance. Pain associated with hyperalgesia tends to be more diffuse than the pre-existing pain and less defined in quality. Management of opioid induced hyperalgesia requires opioid dose reduction.  Cancer: Chronic opioid therapy has been associated with an increased risk of cancer among noncancer patients with chronic pain. This association was more evident in chronic strong opioid users. Chronic opioid consumption causes significant pathological changes in the small intestine and colon. Epidemiological studies have found that there is a link between opium dependence and initiation of gastrointestinal cancers. Cancer is the second leading cause of death after cardiovascular disease. Chronic use of opioids can cause multiple conditions such as GERD, immunosuppression and renal damage as well as carcinogenic effects, which are associated with the incidence of cancers.   Mortality: Long-term opioid use has been associated with increased mortality among patients with chronic non-cancer pain (CNCP).  Prescription of long-acting opioids for chronic noncancer pain was associated with a significantly increased risk of all-cause mortality, including deaths from causes other than overdose.  Reference: Von Korff M, Kolodny A, Deyo RA, Chou R. Long-term opioid therapy reconsidered. Ann Intern Med. 2011 Sep 6;155(5):325-8. doi:  10.7326/[id number]. PMID: 96045409; PMCID: [id number]. Randon Goldsmith, Hayward RA, Dunn KM, [place] KP. Risk of adverse events in patients prescribed long-term opioids: A cohort study in the [place] [facility] Research Datalink. Eur J Pain. 2019 May;23(5):908-922. doi: 10.1002/ejp.1357. Epub 2019 Jan 31. PMID: 56213086. Colameco S, Coren JS, Ciervo CA. Continuous opioid treatment for chronic noncancer pain: a time for moderation in prescribing. Postgrad Med. 2009 Jul;121(4):61-6. doi: 10.3810/pgm.2009.07.2032. PMID: 57846962. Rontrell Hamburger RN, Manvel SD, Blazina I, Cristopher Peru, Bougatsos C, Deyo RA. The effectiveness and risks of long-term opioid therapy for chronic pain: a systematic review for a [employer]ways to [employer]. Ann Intern Med. 2015 Feb 17;162(4):276-86. doi: 10.7326/M14-2559. PMID: 95284132. Caryl Bis [facility], Makuc DM. NCHS Data Brief No. 22. [place]: Centers for Disease Control and Prevention; 2009. Sep, Increase in Fatal Poisonings Involving Opioid Analgesics in the [place], 1999-2006. Song IA, Choi HR, Oh TK. Long-term opioid use and mortality in patients with chronic non-cancer pain: Ten-year follow-up study in [place] from 2010 through 2019. EClinicalMedicine. 2022 Jul 18;51:101558. doi: 10.1016/j.eclinm.2022.440102. PMID: 72536644; PMCID: [id number]. Huser, W., Schubert, T., Vogelmann, T. et al. All-cause mortality in patients with long-term opioid therapy compared with non-opioid analgesics for chronic non-cancer pain: a database study. BMC Med 18, 162 (2020). http://lester.info/ Rashidian H, Karie Kirks, Malekzadeh R, Haghdoost AA. An Ecological Study of the Association between Opiate Use and Incidence of Cancers. Addict Health. 2016 Fall;8(4):252-260. PMID: 38756433; PMCID: [id number].  Our Goal: Our goal is to control your pain with means other  than the use of opioid pain medications.  Our Recommendation: Talk to your physician about coming off of these medications. We can assist you with the tapering down and stopping these medicines. Based on the new information, even if you cannot completely stop the medication, a decrease in the dose may be associated with a lesser risk. Ask for other means of controlling the pain. Decrease or eliminate those factors that significantly contribute to your pain such as smoking, obesity, and a diet heavily tilted towards "inflammatory" nutrients.  Last Updated: 04/11/2023   ______________________________________________________________________       ______________________________________________________________________    National Pain Medication Shortage  The [place] is experiencing worsening drug shortages. These have had a negative  widespread effect on patient care and treatment. Not expected to improve any time soon. Predicted to last past 2029.   Drug shortage list (generic names) Oxycodone IR Oxycodone/APAP Oxymorphone IR Hydromorphone Hydrocodone/APAP Morphine  Where is the problem?  Manufacturing and supply level.  Will this shortage affect you?  Only if you take any of the above pain medications.  How? You may be unable to fill your prescription.  Your pharmacist may offer a "partial fill" of your prescription. (Warning: Do not accept partial fills.) Prescriptions partially filled cannot be transferred to another pharmacy. Read our Medication Rules and Regulation. Depending on how much medicine you are dependent on, you may experience withdrawals when unable to get the medication.  Recommendations: Consider ending your dependence on opioid pain medications. Ask your pain specialist to assist you with the process. Consider switching to a medication currently not in shortage, such as Buprenorphine. Talk to your pain specialist about this option. Consider decreasing your pain  medication requirements by managing tolerance thru "Drug Holidays". This may help minimize withdrawals, should you run out of medicine. Control your pain thru the use of non-pharmacological interventional therapies.   Your prescriber: Prescribers cannot be blamed for shortages. Medication manufacturing and supply issues cannot be fixed by the prescriber.   NOTE: The prescriber is not responsible for supplying the medication, or solving supply issues. Work with your pharmacist to solve it. The patient is responsible for the decision to take or continue taking the medication and for identifying and securing a legal supply source. By law, supplying the medication is the job and responsibility of the pharmacy. The prescriber is responsible for the evaluation, monitoring, and prescribing of these medications.   Prescribers will NOT: Re-issue prescriptions that have been partially filled. Re-issue prescriptions already sent to a pharmacy.  Re-send prescriptions to a different pharmacy because yours did not have your medication. Ask pharmacist to order more medicine or transfer the prescription to another pharmacy. (Read below.)  New 2023 regulation: "June 04, 2022 Revised Regulation Allows DEA-Registered Pharmacies to Transfer Electronic Prescriptions at a Patient's Request DEA Headquarters Division - Public Information Office Patients now have the ability to request their electronic prescription be transferred to another pharmacy without having to go back to their practitioner to initiate the request. This revised regulation went into effect on Monday, May 31, 2022.     At a patient's request, a DEA-registered retail pharmacy can now transfer an electronic prescription for a controlled substance (schedules II-V) to another DEA-registered retail pharmacy. Prior to this change, patients would have to go through their practitioner to cancel their prescription and have it re-issued to a different  pharmacy. The process was taxing and time consuming for both patients and practitioners.    The Drug Enforcement Administration [facility]) published its intent to revise the process for transferring electronic prescriptions on August 22, 2020.  The final rule was published in the federal register on April 29, 2022 and went into effect 30 days later.  Under the final rule, a prescription can only be transferred once between pharmacies, and only if allowed under existing state or other applicable law. The prescription must remain in its electronic form; may not be altered in any way; and the transfer must be communicated directly between two licensed pharmacists. It's important to note, any authorized refills transfer with the original prescription, which means the entire prescription will be filled at the same pharmacy".  Reference: HugeHand.is [facility] website announcement)  [email or web address].pdf Financial planner of  Justice)   [employer] / Vol. 88, No. 143 / Thursday, April 29, 2022 / Rules and Regulations DEPARTMENT OF JUSTICE  Drug Enforcement Administration  21 CFR Part 1306  [Docket No. [id number]]  RIN [id number] Transfer of Electronic Prescriptions for Schedules II-V Controlled Substances Between Pharmacies for Initial Filling  ______________________________________________________________________       ______________________________________________________________________    Transfer of Pain Medication between Pharmacies  Re: 2023 DEA Clarification on existing regulation  Published on DEA Website: June 04, 2022  Title: Revised Regulation Allows DEA-Registered Pharmacies to Electrical engineer Prescriptions at a Patient's Request DEA Headquarters Division - [employer]  "Patients now have the ability to  request their electronic prescription be transferred to another pharmacy without having to go back to their practitioner to initiate the request. This revised regulation went into effect on Monday, May 31, 2022.     At a patient's request, a DEA-registered retail pharmacy can now transfer an electronic prescription for a controlled substance (schedules II-V) to another DEA-registered retail pharmacy. Prior to this change, patients would have to go through their practitioner to cancel their prescription and have it re-issued to a different pharmacy. The process was taxing and time consuming for both patients and practitioners.    The Drug Enforcement Administration [facility]) published its intent to revise the process for transferring electronic prescriptions on August 22, 2020.  The final rule was published in the federal register on April 29, 2022 and went into effect 30 days later.  Under the final rule, a prescription can only be transferred once between pharmacies, and only if allowed under existing state or other applicable law. The prescription must remain in its electronic form; may not be altered in any way; and the transfer must be communicated directly between two licensed pharmacists. It's important to note, any authorized refills transfer with the original prescription, which means the entire prescription will be filled at the same pharmacy."    REFERENCES: 1. DEA website announcement [email or web address].[email or web address]  2. Department of Justice website  CheapWipes.at.pdf  3. DEPARTMENT OF JUSTICE Drug Enforcement Administration 21 CFR Part 1306 [Docket No. [id number]] RIN [id number] "Transfer of Electronic Prescriptions for Schedules II-V Controlled Substances Between Pharmacies for Initial  Filling"  ______________________________________________________________________       ______________________________________________________________________     Naloxone Nasal Spray  Why am I receiving this medication? [place] STOP ACT requires that all patients taking high dose opioids or at risk of opioids respiratory depression, be prescribed an opioid reversal agent, such as Naloxone (AKA: Narcan).  What is this medication? NALOXONE (nal OX one) treats opioid overdose, which causes slow or shallow breathing, severe drowsiness, or trouble staying awake. Call emergency services after using this medication. You may need additional treatment. Naloxone works by reversing the effects of opioids. It belongs to a group of medications called opioid blockers.  COMMON BRAND NAME(S): Kloxxado, Narcan  What should I tell my care team before I take this medication? They need to know if you have any of these conditions: Heart disease Substance use disorder An unusual or allergic reaction to naloxone, other medications, foods, dyes, or preservatives Pregnant or trying to get pregnant Breast-feeding  When to use this medication? This medication is to be used for the treatment of respiratory depression (less than 8 breaths per minute) secondary to opioid overdose.   How to use this medication? This medication is for use in the nose. Lay the person on their back. Support their neck with your hand and allow  the head to tilt back before giving the medication. The nasal spray should be given into 1 nostril. After giving the medication, move the person onto their side. Do not remove or test the nasal spray until ready to use. Get emergency medical help right away after giving the first dose of this medication, even if the person wakes up. You should be familiar with how to recognize the signs and symptoms of a narcotic overdose. If more doses are needed, give the additional dose in the other  nostril. Talk to your care team about the use of this medication in children. While this medication may be prescribed for children as young as newborns for selected conditions, precautions do apply.  Naloxone Overdosage: If you think you have taken too much of this medicine contact a poison control center or emergency room at once.  NOTE: This medicine is only for you. Do not share this medicine with others.  What if I miss a dose? This does not apply.  What may interact with this medication? This is only used during an emergency. No interactions are expected during emergency use. This list may not describe all possible interactions. Give your health care provider a list of all the medicines, herbs, non-prescription drugs, or dietary supplements you use. Also tell them if you smoke, drink alcohol, or use illegal drugs. Some items may interact with your medicine.  What should I watch for while using this medication? Keep this medication ready for use in the case of an opioid overdose. Make sure that you have the phone number of your care team and local hospital ready. You may need to have additional doses of this medication. Each nasal spray contains a single dose. Some emergencies may require additional doses. After use, bring the treated person to the nearest hospital or call 911. Make sure the treating care team knows that the person has received a dose of this medication. You will receive additional instructions on what to do during and after use of this medication before an emergency occurs.  What side effects may I notice from receiving this medication? Side effects that you should report to your care team as soon as possible: Allergic reactions--skin rash, itching, hives, swelling of the face, lips, tongue, or throat Side effects that usually do not require medical attention (report these to your care team if they continue or are bothersome): Constipation Dryness or irritation inside the  nose Headache Increase in blood pressure Muscle spasms Stuffy nose Toothache This list may not describe all possible side effects. Call your doctor for medical advice about side effects. You may report side effects to FDA at 1-800-FDA-1088.  Where should I keep my medication? Because this is an emergency medication, you should keep it with you at all times.  Keep out of the reach of children and pets. Store between 20 and 25 degrees C (68 and 77 degrees F). Do not freeze. Throw away any unused medication after the expiration date. Keep in original box until ready to use.  NOTE: This sheet is a summary. It may not cover all possible information. If you have questions about this medicine, talk to your doctor, pharmacist, or health care provider.   2023 Elsevier/Gold Standard (2021-05-29 00:00:00)  ______________________________________________________________________

## 2023-07-23 LAB — TOXASSURE SELECT 13 (MW), URINE

## 2023-07-26 ENCOUNTER — Encounter: Payer: Self-pay | Admitting: Pain Medicine

## 2023-07-26 ENCOUNTER — Telehealth: Payer: Self-pay

## 2023-07-26 DIAGNOSIS — Z91148 Patient's other noncompliance with medication regimen for other reason: Secondary | ICD-10-CM | POA: Insufficient documentation

## 2023-07-26 DIAGNOSIS — R892 Abnormal level of other drugs, medicaments and biological substances in specimens from other organs, systems and tissues: Secondary | ICD-10-CM | POA: Insufficient documentation

## 2023-07-26 DIAGNOSIS — F1290 Cannabis use, unspecified, uncomplicated: Secondary | ICD-10-CM | POA: Insufficient documentation

## 2023-07-26 NOTE — Telephone Encounter (Signed)
Patient notified that patient will no longer be seen for medication management due to abnormal UDS.

## 2023-07-26 NOTE — Progress Notes (Signed)
(  07/20/2023) abnormal UDS: (+) for unreported THC (Marijuana). Violation of medication agreement. Medication management terminated. I will remain available for interventional therapies. Letter sent to patient.

## 2023-08-15 ENCOUNTER — Ambulatory Visit: Payer: Medicare HMO | Admitting: Orthopedic Surgery

## 2023-08-15 ENCOUNTER — Encounter: Payer: Self-pay | Admitting: Orthopedic Surgery

## 2023-08-15 DIAGNOSIS — M5416 Radiculopathy, lumbar region: Secondary | ICD-10-CM | POA: Diagnosis not present

## 2023-08-15 NOTE — Progress Notes (Unsigned)
Office Visit Note   Patient: Caleb Edwards           Date of Birth: March 23, 1951           MRN: [id number] Visit Date: 08/15/2023 Requested by: Diana Eves, MD [address] [place],  [place] [address] PCP: Diana Eves, MD  Subjective: Chief Complaint  Patient presents with   Lower Back - Pain    HPI: Caleb Edwards is a 72 y.o. male who presents to the office reporting low back pain.  Saw Dr. Christell Constant for low back pain in March.  He does go to the pain clinic.  From what it sounds like he did receive some all TRAM to 1924.  4 refills.  Also has Narcan.  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  He has had no prior surgery but wants to avoid surgery.  Does report radicular leg pain.  Has numbness and tingling in both legs.  He has a shot scheduled for Thursday.  This would be in his back.  Last epidural steroid injections were in March and April in [place].  Arthritis Tylenol helps him some.  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                ROS: All systems reviewed are negative as they relate to the chief complaint within the history of present illness.  Patient denies fevers or chills.  Assessment & Plan: Visit Diagnoses:  1. Radiculopathy, lumbar region     Plan: Impression is low back pain.  I did talk with him about how we do not do long-term pain management.  He is getting his shot Thursday from his pain medicine physician but would like to look at getting an injection from Dr. Alvester Morin in a month.  His last injections with this provider did not give him the type of relief that he is used to.  He will follow-up with [place] as needed.  Follow-Up Instructions: No follow-ups on file.   Orders:  No orders  of the defined types were placed in this encounter.  No orders of the defined types were placed in this encounter.     Procedures: No procedures performed   Clinical Data: No additional findings.  Objective: Vital Signs: There were no vitals taken for this visit.  Physical Exam:  Constitutional: Patient appears well-developed HEENT:  Head: Normocephalic Eyes:EOM are normal Neck: Normal range of motion Cardiovascular: Normal rate Pulmonary/chest: Effort normal Neurologic: Patient is alert Skin: Skin is warm Psychiatric: Patient has normal mood and affect  Ortho Exam: Ortho exam demonstrates no nerve root tension signs.  5 out of 5 ankle dorsiflexion plantarflexion quad hamstring strength with perfused and sensate feet.  No trochanteric tenderness.  Mild pain with forward lateral bending.  No muscular atrophy in the legs with symmetric reflexes bilateral patella and Achilles at 1+ out of 4.  Specialty Comments:  No specialty comments available.  Imaging: No results found.   PMFS History: Patient Active Problem List   Diagnosis Date Noted   Abnormal drug screen (07/20/2023) 07/26/2023   Marijuana use 07/26/2023   Pain medication agreement broken 07/26/2023   Chronic radicular pain of lower back 07/20/2023  Abnormal MRI, lumbar spine (12/16/2022) 12/20/2022   Chronic low back pain (Bilateral) w/ sciatica (Left) 12/07/2022   Lumbar facet joint pain 12/02/2022   Coccygodynia 12/02/2022   Sacrococcygeal pain 12/02/2022   Cervicothoracic interspinous bursitis (C6-7) 06/28/2022   Cervical radiculopathy (Right) 12/09/2021   Cervical spondylosis with radiculopathy 12/09/2021   Abnormal MRI, cervical spine (11/28/2020) 12/09/2021   Spondylosis without myelopathy or radiculopathy, lumbosacral region 06/30/2021   Falls, sequela 06/16/2021   Chronic use of opiate for therapeutic purpose 03/11/2021   Cervicalgia 10/28/2020   DDD (degenerative disc disease), cervical  10/28/2020   Abnormal x-ray of cervical spine 10/28/2020   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) 10/28/2020   Cervical facet arthropathy (Right: C4-5, C5-6) 10/28/2020   Grade 1 Anterolisthesis of cervical spine (1-2 mm) (C4/C5) 10/28/2020   Cervical foraminal stenosis (Right: C5-C8) (Left: C6-C7) 10/28/2020   Cervical radiculopathy (Left) 09/22/2020   Duodenal stenosis    Absolute anemia    Uncomplicated opioid dependence (HCC) 03/27/2020   Iron deficiency anemia due to chronic blood loss 01/08/2020   Lower GI bleed 08/08/2019   Pharmacologic therapy 06/04/2019   Disorder of skeletal system 06/04/2019   Problems influencing health status 06/04/2019   Chronic hip pain (2ry area of Pain) (Bilateral) (R>L) 06/04/2019   Grade 1 Anterolisthesis (7-76mm) of L5 over S1 06/13/2018   DDD (degenerative disc disease), lumbosacral 06/13/2018   Lumbar facet arthropathy 06/13/2018   HTN (hypertension) 02/03/2018   CKD (chronic kidney disease), stage III (HCC) 02/03/2018   Rectal bleeding 01/23/2018   Blood in stool    GI bleed 07/31/2017   Acute blood loss anemia 07/31/2017   Abdominal pain 07/31/2017   Near syncope 07/31/2017   Chronic pain syndrome 09/09/2016   Vitamin D insufficiency 01/07/2016   Symptomatic anemia 01/02/2016   Long term current use of opiate analgesic 09/24/2015   Long term prescription opiate use 09/24/2015   Opiate use 09/24/2015   Encounter for therapeutic drug level monitoring 09/24/2015   Encounter for chronic pain management 09/24/2015   Insomnia secondary to chronic pain 09/24/2015   Osteoarthritis 09/24/2015   Chronic low back pain (1ry area of Pain) (Bilateral) (R>L) w/o sciatica 09/24/2015   Lumbar spondylosis 09/24/2015   Chronic lower extremity pain (3ry area of Pain) (Bilateral) (R>L) 09/24/2015   Chronic lumbar radicular pain (Left) (L4 Dermatome) 09/24/2015   Lumbar facet syndrome (Bilateral) (R>L) 09/24/2015   Diffuse myofascial pain syndrome 09/24/2015    L4-5 disc bulge 09/24/2015   Lumbar foraminal stenosis (Bilateral) (L4-5) (L>R) 09/24/2015   Chronic neck pain 09/24/2015   Cervical spondylosis 09/24/2015   Past Medical History:  Diagnosis Date   Back pain    Blood transfusion without reported diagnosis 12/2015   bleeding ulcer hospitalization.    Diverticulitis    Hypertension    Kidney stone    Ulcer, stomach peptic, chronic     Family History  Problem Relation Age of Onset   Diabetes Mother    Heart disease Father    Prostate cancer Brother    Bladder Cancer Neg Hx    Kidney cancer Neg Hx     Past Surgical History:  Procedure Laterality Date   COLONOSCOPY WITH PROPOFOL N/A 08/02/2017   Procedure: COLONOSCOPY WITH PROPOFOL;  Surgeon: Midge Minium, MD;  Location: [facility] ENDOSCOPY;  Service: Endoscopy;  Laterality: N/A;   COLONOSCOPY WITH PROPOFOL N/A 02/05/2018   Procedure: COLONOSCOPY WITH PROPOFOL;  Surgeon: Wyline Mood, MD;  Location: [facility] ENDOSCOPY;  Service: Gastroenterology;  Laterality: N/A;  COLONOSCOPY WITH PROPOFOL N/A 08/09/2019   Procedure: COLONOSCOPY WITH PROPOFOL;  Surgeon: Toledo, Rainone Nearing, MD;  Location: [facility] ENDOSCOPY;  Service: Gastroenterology;  Laterality: N/A;   ESOPHAGOGASTRODUODENOSCOPY (EGD) WITH PROPOFOL N/A 01/03/2016   Procedure: ESOPHAGOGASTRODUODENOSCOPY (EGD) WITH PROPOFOL;  Surgeon: Scot Jun, MD;  Location: [facility] ENDOSCOPY;  Service: Endoscopy;  Laterality: N/A;   ESOPHAGOGASTRODUODENOSCOPY (EGD) WITH PROPOFOL  08/02/2017   Procedure: ESOPHAGOGASTRODUODENOSCOPY (EGD) WITH PROPOFOL;  Surgeon: Midge Minium, MD;  Location: [facility] ENDOSCOPY;  Service: Endoscopy;;   ESOPHAGOGASTRODUODENOSCOPY (EGD) WITH PROPOFOL N/A 02/05/2018   Procedure: ESOPHAGOGASTRODUODENOSCOPY (EGD) WITH PROPOFOL;  Surgeon: Wyline Mood, MD;  Location: [facility] ENDOSCOPY;  Service: Gastroenterology;  Laterality: N/A;   ESOPHAGOGASTRODUODENOSCOPY (EGD) WITH PROPOFOL N/A 03/30/2018   Procedure: ESOPHAGOGASTRODUODENOSCOPY (EGD) WITH  PROPOFOL WITH DILATION;  Surgeon: Wyline Mood, MD;  Location: [facility] ENDOSCOPY;  Service: Gastroenterology;  Laterality: N/A;   ESOPHAGOGASTRODUODENOSCOPY (EGD) WITH PROPOFOL N/A 08/09/2019   Procedure: ESOPHAGOGASTRODUODENOSCOPY (EGD) WITH PROPOFOL;  Surgeon: Toledo, Pallone Nearing, MD;  Location: [facility] ENDOSCOPY;  Service: Gastroenterology;  Laterality: N/A;   ESOPHAGOGASTRODUODENOSCOPY (EGD) WITH PROPOFOL N/A 04/24/2020   Procedure: ESOPHAGOGASTRODUODENOSCOPY (EGD) WITH PROPOFOL;  Surgeon: Wyline Mood, MD;  Location: [facility] ENDOSCOPY;  Service: Gastroenterology;  Laterality: N/A;   GIVENS CAPSULE STUDY N/A 04/24/2020   Procedure: GIVENS CAPSULE STUDY;  Surgeon: Wyline Mood, MD;  Location: [facility] ENDOSCOPY;  Service: Gastroenterology;  Laterality: N/A;   MAXILLARY ANTROSTOMY Left 12/10/2022   Procedure: MAXILLARY ANTROSTOMY WITH TISSUE REMOVAL;  Surgeon: Linus Salmons, MD;  Location: [facility]GERY [facility];  Service: ENT;  Laterality: Left;   Social History   Occupational History   Not on file  Tobacco Use   Smoking status: Former    Passive exposure: Past   Smokeless tobacco: Never  Vaping Use   Vaping status: Never Used  Substance and Sexual Activity   Alcohol use: Yes    Alcohol/week: 1.0 standard drink of alcohol    Types: 1 Standard drinks or equivalent per week   Drug use: No   Sexual activity: Yes

## 2023-08-16 ENCOUNTER — Telehealth: Payer: Self-pay | Admitting: Orthopedic Surgery

## 2023-08-16 NOTE — Telephone Encounter (Signed)
Pt wife called requesting a handicap placard for pt. Please call when ready for pick up. Phone number is [phone]

## 2023-08-16 NOTE — Telephone Encounter (Signed)
yes

## 2023-08-17 ENCOUNTER — Other Ambulatory Visit: Payer: Self-pay

## 2023-08-17 ENCOUNTER — Telehealth: Payer: Self-pay | Admitting: Orthopedic Surgery

## 2023-08-17 DIAGNOSIS — M5416 Radiculopathy, lumbar region: Secondary | ICD-10-CM

## 2023-08-17 NOTE — Telephone Encounter (Signed)
Called Speirs,Jeanette T (Spouse) at  [phone] [facility] handicap app upfront

## 2023-08-17 NOTE — Telephone Encounter (Signed)
Put up front for patient to pick up.  

## 2023-08-18 ENCOUNTER — Ambulatory Visit: Payer: Medicare HMO | Attending: Pain Medicine | Admitting: Pain Medicine

## 2023-08-18 ENCOUNTER — Encounter: Payer: Self-pay | Admitting: Pain Medicine

## 2023-08-18 ENCOUNTER — Ambulatory Visit
Admission: RE | Admit: 2023-08-18 | Discharge: 2023-08-18 | Disposition: A | Discharge status: Home / Self Care | Payer: Medicare HMO | Source: Ambulatory Visit | Attending: Pain Medicine | Admitting: Pain Medicine

## 2023-08-18 VITALS — BP 133/97 | HR 96 | Temp 97.2°F | Resp 14 | Ht 72.0 in | Wt 178.0 lb

## 2023-08-18 DIAGNOSIS — G8929 Other chronic pain: Secondary | ICD-10-CM

## 2023-08-18 DIAGNOSIS — M79605 Pain in left leg: Secondary | ICD-10-CM | POA: Diagnosis present

## 2023-08-18 DIAGNOSIS — M47816 Spondylosis without myelopathy or radiculopathy, lumbar region: Secondary | ICD-10-CM

## 2023-08-18 DIAGNOSIS — M79604 Pain in right leg: Secondary | ICD-10-CM | POA: Diagnosis present

## 2023-08-18 DIAGNOSIS — M5137 Other intervertebral disc degeneration, lumbosacral region with discogenic back pain only: Secondary | ICD-10-CM | POA: Diagnosis present

## 2023-08-18 DIAGNOSIS — M51369 Other intervertebral disc degeneration, lumbar region without mention of lumbar back pain or lower extremity pain: Secondary | ICD-10-CM

## 2023-08-18 DIAGNOSIS — M4310 Spondylolisthesis, site unspecified: Secondary | ICD-10-CM

## 2023-08-18 DIAGNOSIS — M5416 Radiculopathy, lumbar region: Secondary | ICD-10-CM | POA: Diagnosis present

## 2023-08-18 DIAGNOSIS — M51379 Other intervertebral disc degeneration, lumbosacral region without mention of lumbar back pain or lower extremity pain: Secondary | ICD-10-CM | POA: Diagnosis present

## 2023-08-18 DIAGNOSIS — R937 Abnormal findings on diagnostic imaging of other parts of musculoskeletal system: Secondary | ICD-10-CM

## 2023-08-18 DIAGNOSIS — M545 Low back pain, unspecified: Secondary | ICD-10-CM

## 2023-08-18 MED ORDER — ROPIVACAINE HCL 2 MG/ML IJ SOLN
2.0000 mL | Freq: Once | INTRAMUSCULAR | Status: AC
  Administered 2023-08-18: 2 mL via EPIDURAL
  Filled 2023-08-18: qty 20

## 2023-08-18 MED ORDER — MIDAZOLAM HCL 2 MG/2ML IJ SOLN: 0.5000 mg | Freq: Once | INTRAMUSCULAR | Status: DC

## 2023-08-18 MED ORDER — SODIUM CHLORIDE (PF) 0.9 % IJ SOLN
INTRAMUSCULAR | Status: AC
Start: 2023-08-18 — End: ?
  Filled 2023-08-18: qty 10

## 2023-08-18 MED ORDER — LACTATED RINGERS IV SOLN: Freq: Once | INTRAVENOUS | Status: DC

## 2023-08-18 MED ORDER — TRIAMCINOLONE ACETONIDE 40 MG/ML IJ SUSP
40.0000 mg | Freq: Once | INTRAMUSCULAR | Status: AC
  Administered 2023-08-18: 40 mg
  Filled 2023-08-18: qty 1

## 2023-08-18 MED ORDER — SODIUM CHLORIDE 0.9% FLUSH
2.0000 mL | Freq: Once | INTRAVENOUS | Status: AC
  Administered 2023-08-18: 2 mL

## 2023-08-18 MED ORDER — LIDOCAINE HCL 2 % IJ SOLN
20.0000 mL | Freq: Once | INTRAMUSCULAR | Status: AC
  Administered 2023-08-18: 200 mg
  Filled 2023-08-18: qty 40

## 2023-08-18 MED ORDER — PENTAFLUOROPROP-TETRAFLUOROETH EX AERO: INHALATION_SPRAY | Freq: Once | CUTANEOUS | Status: DC

## 2023-08-18 MED ORDER — IOHEXOL 180 MG/ML  SOLN
10.0000 mL | Freq: Once | INTRAMUSCULAR | Status: AC
  Administered 2023-08-18: 10 mL via EPIDURAL
  Filled 2023-08-18: qty 20

## 2023-08-18 NOTE — Patient Instructions (Signed)

## 2023-08-18 NOTE — Progress Notes (Signed)
Safety precautions to be maintained throughout the outpatient stay will include: orient to surroundings, keep bed in low position, maintain call bell within reach at all times, provide assistance with transfer out of bed and ambulation.  

## 2023-08-18 NOTE — Progress Notes (Signed)
PROVIDER NOTE: Interpretation of information contained herein should be left to medically-trained personnel. Specific patient instructions are provided elsewhere under "Patient Instructions" section of medical record. This document was created in part using STT-dictation technology, any transcriptional errors that may result from this process are unintentional.  Patient: Caleb Edwards Type: Established DOB: [birth year] MRN: [id number] PCP: Diana Eves, MD  Service: Procedure DOS: 08/18/2023 Setting: Ambulatory Location: Ambulatory outpatient facility Delivery: Face-to-face Provider: Oswaldo Done, MD Specialty: Interventional Pain Management Specialty designation: 09 Location: Outpatient facility Ref. Prov.: Delano Metz, MD       Interventional Therapy   Type: Lumbar epidural steroid injection (LESI) (interlaminar) #1    Laterality: Right   Level:  L2-3 Level.  Imaging: Fluoroscopic guidance Spinal ([id number]) Anesthesia: Local anesthesia (1-2% Lidocaine) Anxiolysis: None                 Sedation: No Sedation                       DOS: 08/18/2023  Performed by: Oswaldo Done, MD  Purpose: Diagnostic/Therapeutic Indications: Lumbar radicular pain of intraspinal etiology of more than 4 weeks that has failed to respond to conservative therapy and is severe enough to impact quality of life or function. 1. Chronic low back pain (1ry area of Pain) (Bilateral) (R>L) w/o sciatica   2. Degeneration of intervertebral disc of lumbosacral region with discogenic back pain   3. Grade 1 Anterolisthesis (7-48mm) of L5 over S1   4. L4-5 disc bulge   5. Lumbar spondylosis   6. Abnormal MRI, lumbar spine (12/16/2022)   7. Chronic lower extremity pain (3ry area of Pain) (Bilateral) (R>L)   8. Chronic radicular pain of lower back   9. 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 Degeneration of intervertebral disc of lumbosacral region, unspecified whether pain present    11. Lumbar facet arthropathy    NAS-11 Pain score:   Pre-procedure: 9 /10   Post-procedure: 2 /10   (12/16/2022) LUMBAR MRI FINDINGS: Alignment: Dextroconvex lumbar curvature. Trace degenerative retrolisthesis at L2-3 and L3-4. Grade 1 anterolisthesis at L5-S1. Conus medullaris and cauda equina: disorganization at the level of L1-2 related to spinal canal stenosis. Paraspinal and other soft tissues: Small simple renal cysts which require no follow-up imaging.  DISC LEVELS: T12-L1: Minimal disc bulging.  L1-2: Broad disc bulging with superimposed right paracentral disc extrusion results in displacement of the cauda quinine nerve roots and right-sided descending nerve impingement. There is also a in inferiorly migrated left subarticular disc extrusion contributing to nerve root displacement, and also likely resulting in descending left-sided nerve impingement. Moderate left neural foraminal stenosis. L2-3: Broad disc bulging, ligament flavum hypertrophy bilateral facet arthropathy result in moderate spinal canal and subarticular narrowing. Mild amount left and mild right neural foraminal narrowing. L3-4: Endplate spurring with broad disc bulging, ligamentum hypertrophy bilateral facet arthropathy results in moderate-severe spinal canal stenosis. Severe bilateral neural foraminal stenosis, right greater than left. L4-5: Broad disc bulging, ligament flavum hypertrophy mild facet arthropathy results in moderate-severe spinal canal stenosis, and severe bilateral neural foraminal stenosis. L5-S1: Degenerative grade 1 anterolisthesis with broad disc bulging, ligamentum hypertrophy and severe bilateral facet arthropathy. There is moderate-severe spinal canal stenosis, moderate-severe left and severe right neural foraminal stenosis.  IMPRESSION: L1-2: Broad disc bulging with superimposed right paracentral disc extrusion results in displacement of the cauda equina nerve roots and right-sided descending  nerve impingement. Additional inferiorly migrated left subarticular disc extrusion  likely results in descending left-sided nerve impingement. Moderate left neural foraminal stenosis. L2-3: Moderate spinal canal and subarticular narrowing. Mild left and mild right neural foraminal stenosis. L3-4: Moderate-severe spinal canal stenosis. Severe bilateral neural foraminal stenosis, right greater than left. L4-5: Moderate-severe spinal canal stenosis. Severe bilateral neural foraminal stenosis. L5-S1: Moderate-severe spinal canal stenosis. Moderate-severe left and severe right neural foraminal stenosis.  Interventional note: It would appear that the patient had an appointment on 08/15/2023 with Dr. Cammy Copa [facility]).  According to the note the patient expressed his interest in getting injections from Dr. Alvester Morin.  In the note it is clear that they are aware that he is coming here for his injections and that he had this epidural steroid injection scheduled for today 08/18/2023.  Today also had a conversation with the patient regarding his intentions to have some of these pain management injections done elsewhere and I have reminded him that it is entirely his choice as to where he has these treatments done, but that we suggest that he has all of them done had 1 particular practice that can keep track of the medications that he is receiving so as to avoid treatment duplication and the possible side effects and adverse reactions of overuse of some of these medications.  After I explained all of this to him, he said: "I think I will stay here".  Pharmacotherapy note: On the patient's last visit to our practice on 07/20/2023, while undergoing a routine medication management assessment, we informed the patient that we needed to have an updated urine drug screening test.  At the time the patient gave [place] the excuse that he had just voided and could not really give [place] a sample but we informed him  that he no better and that we always tell the patients not to void before coming in in case and unannounced UDS is required as it was the case on 07/20/2023.  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  Upon learning the results of the test and seem that this represented a violation of our medication agreement, I proceeded to inform the patient on a letter sent on 07/26/2023 that we would be altering her chronic pain treatment plan.  We informed him that he would no longer include the use of controlled substances but that we would remain available for interventional therapies.  Since the patient had already received a prescription for his tramadol, he was informed that he needed to slowly taper it down and stop it since it would not longer be renewed.     Position / Prep / Materials:  Position: Prone w/ head of the table raised (slight reverse trendelenburg) to facilitate breathing.  Prep solution: ChloraPrep (2% chlorhexidine gluconate and 70% isopropyl alcohol) Prep Area: Entire Posterior Lumbar Region from lower scapular tip down to mid buttocks area and from flank to flank. Materials:  Tray: Epidural tray Needle(s):  Type: Epidural needle (Tuohy) Gauge (G):  17 Length: Regular (3.5-in) Qty: 1   H&P (Pre-op Assessment):  Mr. Kinneman is a 72 y.o. (year old), male patient, seen today for interventional treatment. He  has a past surgical history that includes Esophagogastroduodenoscopy (egd) with propofol (N/A, 01/03/2016); Colonoscopy with propofol (N/A, 08/02/2017); Esophagogastroduodenoscopy (egd) with propofol (08/02/2017); Esophagogastroduodenoscopy (egd) with propofol (N/A, 02/05/2018); Colonoscopy with propofol (N/A, 02/05/2018); Esophagogastroduodenoscopy (egd) with propofol (N/A, 03/30/2018); Esophagogastroduodenoscopy (egd) with  propofol (N/A, 08/09/2019);  Colonoscopy with propofol (N/A, 08/09/2019); Givens capsule study (N/A, 04/24/2020); Esophagogastroduodenoscopy (egd) with propofol (N/A, 04/24/2020); and Maxillary antrostomy (Left, 12/10/2022). Mr. Grabenstein has a current medication list which includes the following prescription(s): acetaminophen, amlodipine, chlorthalidone, ferrous sulfate, ibuprofen, lisinopril, naloxone, pantoprazole, rosuvastatin, sildenafil, tramadol, and trazodone, and the following Facility-Administered Medications: pentafluoroprop-tetrafluoroeth. His primarily concern today is the Back Pain  Initial Vital Signs:  Pulse/HCG Rate: 96ECG Heart Rate: 98 Temp: (!) 97.2 F (36.2 C) Resp: 16 BP: (!) 151/85 SpO2: 100 %  BMI: Estimated body mass index is 24.14 kg/m as calculated from the following:   Height as of this encounter: 6' (1.829 m).   Weight as of this encounter: 178 lb (80.7 kg).  Risk Assessment: Allergies: Reviewed. He has No Known Allergies.  Allergy Precautions: None required Coagulopathies: Reviewed. None identified.  Blood-thinner therapy: None at this time Active Infection(s): Reviewed. None identified. Mr. Kunis is afebrile  Site Confirmation: Mr. Mansoor was asked to confirm the procedure and laterality before marking the site Procedure checklist: Completed Consent: Before the procedure and under the influence of no sedative(s), amnesic(s), or anxiolytics, the patient was informed of the treatment options, risks and possible complications. To fulfill our ethical and legal obligations, as recommended by the American Medical Association's Code of Ethics, I have informed the patient of my clinical impression; the nature and purpose of the treatment or procedure; the risks, benefits, and possible complications of the intervention; the alternatives, including doing nothing; the risk(s) and benefit(s) of the alternative treatment(s) or procedure(s); and the risk(s) and benefit(s) of doing nothing. The patient was  provided information about the general risks and possible complications associated with the procedure. These may include, but are not limited to: failure to achieve desired goals, infection, bleeding, organ or nerve damage, allergic reactions, paralysis, and death. In addition, the patient was informed of those risks and complications associated to Spine-related procedures, such as failure to decrease pain; infection (i.e.: Meningitis, epidural or intraspinal abscess); bleeding (i.e.: epidural hematoma, subarachnoid hemorrhage, or any other type of intraspinal or peri-dural bleeding); organ or nerve damage (i.e.: Any type of peripheral nerve, nerve root, or spinal cord injury) with subsequent damage to sensory, motor, and/or autonomic systems, resulting in permanent pain, numbness, and/or weakness of one or several areas of the body; allergic reactions; (i.e.: anaphylactic reaction); and/or death. Furthermore, the patient was informed of those risks and complications associated with the medications. These include, but are not limited to: allergic reactions (i.e.: anaphylactic or anaphylactoid reaction(s)); adrenal axis suppression; blood sugar elevation that in diabetics may result in ketoacidosis or comma; water retention that in patients with history of congestive heart failure may result in shortness of breath, pulmonary edema, and decompensation with resultant heart failure; weight gain; swelling or edema; medication-induced neural toxicity; particulate matter embolism and blood vessel occlusion with resultant organ, and/or nervous system infarction; and/or aseptic necrosis of one or more joints. Finally, the patient was informed that Medicine is not an exact science; therefore, there is also the possibility of unforeseen or unpredictable risks and/or possible complications that may result in a catastrophic outcome. The patient indicated having understood very clearly. We have given the patient no guarantees  and we have made no promises. Enough time was given to the patient to ask questions, all of which were answered to the patient's satisfaction. Mr. Mcillwain has indicated that he wanted to continue with the procedure. Attestation: I, the ordering provider, attest that I have discussed with the patient  the benefits, risks, side-effects, alternatives, likelihood of achieving goals, and potential problems during recovery for the procedure that I have provided informed consent. Date  Time: 08/18/2023 12:36 PM   Pre-Procedure Preparation:  Monitoring: As per clinic protocol. Respiration, ETCO2, SpO2, BP, heart rate and rhythm monitor placed and checked for adequate function Safety Precautions: Patient was assessed for positional comfort and pressure points before starting the procedure. Time-out: I initiated and conducted the "Time-out" before starting the procedure, as per protocol. The patient was asked to participate by confirming the accuracy of the "Time Out" information. Verification of the correct person, site, and procedure were performed and confirmed by me, the nursing staff, and the patient. "Time-out" conducted as per Joint Commission's Universal Protocol (UP.01.01.01). Time: 1300 Start Time: 1300 hrs.  Description/Narrative of Procedure:          Target: Epidural space via interlaminar opening, initially targeting the lower laminar border of the superior vertebral body. Region: Lumbar Approach: Percutaneous paravertebral  Rationale (medical necessity): procedure needed and proper for the diagnosis and/or treatment of the patient's medical symptoms and needs. Procedural Technique Safety Precautions: Aspiration looking for blood return was conducted prior to all injections. At no point did we inject any substances, as a needle was being advanced. No attempts were made at seeking any paresthesias. Safe injection practices and needle disposal techniques used. Medications properly checked for  expiration dates. SDV (single dose vial) medications used. Description of the Procedure: Protocol guidelines were followed. The procedure needle was introduced through the skin, ipsilateral to the reported pain, and advanced to the target area. Bone was contacted and the needle walked caudad, until the lamina was cleared. The epidural space was identified using "loss-of-resistance technique" with 2-3 ml of PF-NaCl (0.9% NSS), in a 5cc LOR glass syringe.  Vitals:   08/18/23 1259 08/18/23 1305 08/18/23 1308 08/18/23 1314  BP: 127/76 130/84 129/78 (!) 133/97  Pulse:      Resp: 12 12 14 14   Temp:      SpO2: 98% 100% 100% 96%  Weight:      Height:        Start Time: 1300 hrs. End Time: 1313 hrs.  Imaging Guidance (Spinal):          Type of Imaging Technique: Fluoroscopy Guidance (Spinal) Indication(s): Fluoroscopy guidance for needle placement to enhance accuracy in procedures requiring precise needle localization for targeted delivery of medication in or near specific anatomical locations not easily accessible without such real-time imaging assistance. Exposure Time: Please see nurses notes. Contrast: Before injecting any contrast, we confirmed that the patient did not have an allergy to iodine, shellfish, or radiological contrast. Once satisfactory needle placement was completed at the desired level, radiological contrast was injected. Contrast injected under live fluoroscopy. No contrast complications. See chart for type and volume of contrast used. Fluoroscopic Guidance: I was personally present during the use of fluoroscopy. "Tunnel Vision Technique" used to obtain the best possible view of the target area. Parallax error corrected before commencing the procedure. "Direction-depth-direction" technique used to introduce the needle under continuous pulsed fluoroscopy. Once target was reached, antero-posterior, oblique, and lateral fluoroscopic projection used confirm needle placement in all  planes. Images permanently stored in EMR. Interpretation: I personally interpreted the imaging intraoperatively. Adequate needle placement confirmed in multiple planes. Appropriate spread of contrast into desired area was observed. No evidence of afferent or efferent intravascular uptake. No intrathecal or subarachnoid spread observed. Permanent images saved into the patient's record.  Antibiotic Prophylaxis:  Anti-infectives (From admission, onward)    None      Indication(s): None identified  Post-operative Assessment:  Post-procedure Vital Signs:  Pulse/HCG Rate: 9689 Temp: (!) 97.2 F (36.2 C) Resp: 14 BP: (!) 133/97 SpO2: 96 %  EBL: None  Complications: No immediate post-treatment complications observed by team, or reported by patient.  Note: The patient tolerated the entire procedure well. A repeat set of vitals were taken after the procedure and the patient was kept under observation following institutional policy, for this type of procedure. Post-procedural neurological assessment was performed, showing return to baseline, prior to discharge. The patient was provided with post-procedure discharge instructions, including a section on how to identify potential problems. Should any problems arise concerning this procedure, the patient was given instructions to immediately contact us, at any time, without hesitation. In any case, we plan to contact the patient by telephone for a follow-up status report regarding this interventional procedure.  Comments:  No additional relevant information.  Plan of Care (POC)  Orders:  Orders Placed This Encounter  Procedures   Lumbar Epidural Injection    Scheduling Instructions:     Procedure: Interlaminar LESI L2-3     Laterality: Midline     Sedation: Patient's choice     Timeframe: Today    Order Specific Question:   Where will this procedure be performed?    Answer:   [facility]   DG [facility] C-ARM 1-60 MIN NO REPORT     Intraoperative interpretation by procedural physician at [facility].    Standing Status:   Standing    Number of Occurrences:   1    Order Specific Question:   Reason for exam:    Answer:   Assistance in needle guidance and placement for procedures requiring needle placement in or near specific anatomical locations not easily accessible without such assistance.   Ambulatory referral to Neurosurgery    Referral Priority:   Routine    Referral Type:   Surgical    Referral Reason:   Specialty Services Required    Requested Specialty:   Neurosurgery    Number of Visits Requested:   1   Informed Consent Details: Physician/Practitioner Attestation; Transcribe to consent form and obtain patient signature    Note: Always confirm laterality of pain with Mr. Barentine, before procedure. Transcribe to consent form and obtain patient signature.    Order Specific Question:   Physician/Practitioner attestation of informed consent for procedure/surgical case    Answer:   I, the physician/practitioner, attest that I have discussed with the patient the benefits, risks, side effects, alternatives, likelihood of achieving goals and potential problems during recovery for the procedure that I have provided informed consent.    Order Specific Question:   Procedure    Answer:   Lumbar epidural steroid injection under fluoroscopic guidance    Order Specific Question:   Physician/Practitioner performing the procedure    Answer:   Jini Horiuchi A. Laban Emperor, MD    Order Specific Question:   Indication/Reason    Answer:   Low back and/or lower extremity pain secondary to lumbar radiculitis   Provide equipment / supplies at bedside    Procedural tray: Epidural Tray (Disposable  single use) Skin infiltration needle: Regular 1.5-in, 25-G, (x1) Block needle size: Regular standard Catheter: No catheter required    Standing Status:   Standing    Number of Occurrences:   1    Order Specific Question:   Specify  Answer:   Epidural Tray   Chronic Opioid Analgesic:  Tramadol 50 mg, 2 tabs PO q 6 hrs (400 mg/day of tramadol) MME/day: 40 mg/day.   Medications ordered for procedure: Meds ordered this encounter  Medications   iohexol (OMNIPAQUE) 180 MG/ML injection 10 mL    Must be Myelogram-compatible. If not available, you may substitute with a water-soluble, non-ionic, hypoallergenic, myelogram-compatible radiological contrast medium.   lidocaine (XYLOCAINE) 2 % (with pres) injection 400 mg   pentafluoroprop-tetrafluoroeth (GEBAUERS) aerosol   DISCONTD: lactated ringers infusion   DISCONTD: midazolam (VERSED) injection 0.5-2 mg    Make sure Flumazenil is available in the pyxis when using this medication. If oversedation occurs, administer 0.2 mg IV over 15 sec. If after 45 sec no response, administer 0.2 mg again over 1 min; may repeat at 1 min intervals; not to exceed 4 doses (1 mg)   sodium chloride flush (NS) 0.9 % injection 2 mL   ropivacaine (PF) 2 mg/mL (0.2%) (NAROPIN) injection 2 mL   triamcinolone acetonide (KENALOG-40) injection 40 mg   Medications administered: We administered iohexol, lidocaine, sodium chloride flush, ropivacaine (PF) 2 mg/mL (0.2%), and triamcinolone acetonide.  See the medical record for exact dosing, route, and time of administration.  Follow-up plan:   Return in about 2 weeks (around 09/01/2023) for (Face2F), (PPE).       Interventional Therapies  Risk Factors  Considerations:   Stage3 CKD  HTN  Hx GI Bleed  Anemia w/ near syncope   Planned  Pending:   Diagnostic/therapeutic L1-2 LESI #1    Under consideration:   Diagnostic/therapeutic L1-2 LESI #1  Diagnostic right IA hip joint injection  Diagnostic bilateral lumbar facet block  Possible bilateral lumbar facet RFA    Completed:   Diagnostic/therapeutic left L4 TFESI x3 + left L5-S1 LESI x1 (01/06/2023) (100/100/100/95) (LLEP:100  LBP:95)  Therapeutic midline C6-7 interspinous ligament inj. x1  (06/28/2022) (80/90/75/75)  Diagnostic bilateral lumbar facet MBB x2 (12/02/2022) (100/100/0/0) did not help his left leg pain. Therapeutic left cervical ESI x2 (02/05/2021) (100/100/70 x 10 days/50)  Therapeutic right cervical ESI x1 (12/15/2021) (90/90/90/90)  Therapeutic left L3-4 LESI x2 (06/13/2018) (100/100/85/85)  Therapeutic left L3 TFESI x1 (03/13/2020) (95/95/60/>50)  Therapeutic left L4 TFESI x2 (03/13/2020) (95/95/60/>50)  Therapeutic right L3 TFESI x2 (03/13/2020) (95/95/60/>50)  Therapeutic right L4 TFESI x3 (03/13/2020) (95/95/60/>50)  Referral to neurosurgery Volanda Napoleon, MD) for cervical instability (11/11/2020)    Therapeutic  Palliative (PRN) options:   Palliative left L3-4 LESI   Palliative left L3 TFESI   Palliative left L4 TFESI   Palliative right L3 TFESI   Palliative right L4 TFESI         Recent Visits Date Type Provider Dept  07/20/23 Office Visit Delano Metz, MD [facility]  Showing recent visits within past 90 days and meeting all other requirements Today's Visits Date Type Provider Dept  08/18/23 Procedure visit Delano Metz, MD [facility]  Showing today's visits and meeting all other requirements Future Appointments Date Type Provider Dept  09/06/23 Appointment Delano Metz, MD [facility]  Showing future appointments within next 90 days and meeting all other requirements  Disposition: Discharge home  Discharge (Date  Time): 08/18/2023; 1321 hrs.   Primary Care Physician: Diana Eves, MD Location: [facility] Note by: Oswaldo Done, MD (TTS technology used. I apologize for any typographical errors that were not detected and corrected.) Date: 08/18/2023; Time: 1:24 PM  Disclaimer:  Medicine is not an  exact science. The only guarantee in medicine is that nothing is guaranteed. It is important to note that the decision to proceed with this intervention was  based on the information collected from the patient. The Data and conclusions were drawn from the patient's questionnaire, the interview, and the physical examination. Because the information was provided in large part by the patient, it cannot be guaranteed that it has not been purposely or unconsciously manipulated. Every effort has been made to obtain as much relevant data as possible for this evaluation. It is important to note that the conclusions that lead to this procedure are derived in large part from the available data. Always take into account that the treatment will also be dependent on availability of resources and existing treatment guidelines, considered by other Pain Management Practitioners as being common knowledge and practice, at the time of the intervention. For Medico-Legal purposes, it is also important to point out that variation in procedural techniques and pharmacological choices are the acceptable norm. The indications, contraindications, technique, and results of the above procedure should only be interpreted and judged by a Board-Certified Interventional Pain Specialist with extensive familiarity and expertise in the same exact procedure and technique.

## 2023-08-19 ENCOUNTER — Other Ambulatory Visit: Payer: Self-pay | Admitting: Physical Medicine and Rehabilitation

## 2023-08-19 ENCOUNTER — Telehealth: Payer: Self-pay | Admitting: *Deleted

## 2023-08-19 NOTE — Telephone Encounter (Signed)
No problems post procedure. 

## 2023-08-24 ENCOUNTER — Ambulatory Visit: Payer: Medicare HMO | Admitting: Pain Medicine

## 2023-08-29 ENCOUNTER — Ambulatory Visit: Payer: Medicare HMO | Attending: Pain Medicine | Admitting: Pain Medicine

## 2023-08-29 ENCOUNTER — Encounter: Payer: Self-pay | Admitting: Pain Medicine

## 2023-08-29 VITALS — BP 142/80 | HR 93 | Temp 97.3°F | Resp 17 | Ht 72.0 in | Wt 180.0 lb

## 2023-08-29 DIAGNOSIS — M51372 Other intervertebral disc degeneration, lumbosacral region with discogenic back pain and lower extremity pain: Secondary | ICD-10-CM | POA: Insufficient documentation

## 2023-08-29 DIAGNOSIS — M545 Low back pain, unspecified: Secondary | ICD-10-CM | POA: Insufficient documentation

## 2023-08-29 DIAGNOSIS — M4310 Spondylolisthesis, site unspecified: Secondary | ICD-10-CM | POA: Diagnosis present

## 2023-08-29 DIAGNOSIS — R937 Abnormal findings on diagnostic imaging of other parts of musculoskeletal system: Secondary | ICD-10-CM | POA: Diagnosis present

## 2023-08-29 DIAGNOSIS — Z09 Encounter for follow-up examination after completed treatment for conditions other than malignant neoplasm: Secondary | ICD-10-CM | POA: Diagnosis present

## 2023-08-29 DIAGNOSIS — M5416 Radiculopathy, lumbar region: Secondary | ICD-10-CM | POA: Diagnosis present

## 2023-08-29 DIAGNOSIS — M25552 Pain in left hip: Secondary | ICD-10-CM | POA: Insufficient documentation

## 2023-08-29 DIAGNOSIS — M25551 Pain in right hip: Secondary | ICD-10-CM | POA: Insufficient documentation

## 2023-08-29 DIAGNOSIS — M48061 Spinal stenosis, lumbar region without neurogenic claudication: Secondary | ICD-10-CM | POA: Diagnosis present

## 2023-08-29 DIAGNOSIS — M79604 Pain in right leg: Secondary | ICD-10-CM | POA: Diagnosis present

## 2023-08-29 DIAGNOSIS — M79605 Pain in left leg: Secondary | ICD-10-CM | POA: Insufficient documentation

## 2023-08-29 DIAGNOSIS — G8929 Other chronic pain: Secondary | ICD-10-CM | POA: Insufficient documentation

## 2023-08-29 NOTE — Progress Notes (Signed)
Safety precautions to be maintained throughout the outpatient stay will include: orient to surroundings, keep bed in low position, maintain call bell within reach at all times, provide assistance with transfer out of bed and ambulation.    Pt instructed to notify pcp of increased BP today.

## 2023-08-29 NOTE — Progress Notes (Signed)
PROVIDER NOTE: Information contained herein reflects review and annotations entered in association with encounter. Interpretation of such information and data should be left to medically-trained personnel. Information provided to patient can be located elsewhere in the medical record under "Patient Instructions". Document created using STT-dictation technology, any transcriptional errors that may result from process are unintentional.    Patient: Caleb Edwards  Service Category: E/M  Provider: Oswaldo Done, MD  DOB: 03/25/1951  DOS: 08/29/2023  Referring Provider: Diana Eves, MD  MRN: [id number]  Specialty: Interventional Pain Management  PCP: Caleb Eves, MD  Type: Established Patient  Setting: Ambulatory outpatient    Location: Office  Delivery: Face-to-face     HPI  [name], a 72 y.o. year old male, is here today because of his Chronic bilateral low back pain without sciatica [M54.50, G89.29]. Caleb Edwards primary complain today is Back Pain  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); Chronic neck pain; Cervical spondylosis; Chronic pain syndrome; Abdominal pain; Grade 1 Anterolisthesis (7-1mm) of L5 over S1; DDD (degenerative disc disease), lumbosacral; Lumbar facet arthropathy; Chronic hip pain (2ry area of Pain) (Bilateral) (R>L); Cervical radiculopathy (Left); Cervicalgia; DDD (degenerative disc disease), cervical; Abnormal x-ray of cervical spine; 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); Cervical facet arthropathy (Right: C4-5, C5-6); Grade 1 Anterolisthesis of cervical spine (1-2 mm) (C4/C5); Cervical foraminal  stenosis (Right: C5-C8) (Left: C6-C7); Falls, sequela; Spondylosis without myelopathy or radiculopathy, lumbosacral region; Cervical radiculopathy (Right); Cervical spondylosis with radiculopathy; Abnormal MRI, cervical spine (11/28/2020); 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 Pain Assessment: Severity of Chronic pain is reported as a 6 /10. Location: Back Right, Left/down back of legs bilat to feet. Onset: More than a month ago. Quality: Aching, Burning, Constant, Dull. Timing: Constant. Modifying factor(s): esi. Vitals:  height is 6' (1.829 m) and weight is 180 lb (81.6 kg). His temporal temperature is 97.3 F (36.3 C) (abnormal). His blood pressure is 142/80 (abnormal) and his pulse is 93. His respiration is 17 and oxygen saturation is 100%.  BMI: Estimated body mass index is 24.41 kg/m as calculated from the following:   Height as of this encounter: 6' (1.829 m).   Weight as of this encounter: 180 lb (81.6 kg). Last encounter: 07/20/2023. Last procedure: 08/18/2023.  Reason for encounter: follow-up evaluation requested by patient to discuss UDS results. Claims (07/20/23) (+) craboxy-THC to be from job exposure. However same test was negative for THC on 06/28/2022, 03/11/2021, 03/24/2017,09/09/2016, 12/21/2025, and 09/24/2015.  The Test: We do not use a POC (Point-of-care) immunoassay testing urine drug screen (UDS) test, due to the fact that for illicit drugs, the false-positive rate is 0% for cocaine, 2% for marijuana, 0.9% for amphetamines, and 1.2% for methamphetamines. Instead, our facility uses a forensic state-of-the-art ultra-high performance liquid chromatography and mass spectrometry system  (UPLC/MS-MS), the most sophisticated and accurate method currently available. It is 1,000 times more precise and accurate than standard gas chromatography and mass spectrometry (GC/MS) testing. This is the test used to detect false positive  and false negative results in immunoassay testing. The system can analyze 26 drug categories and 180 drug compounds. It will not only detect the presence of a parent compound its break down products (metabolites), but it will also provide the exact amounts, down to nanograms (1/1,000,000 of a milligram).  Discussed the use of AI scribe software for clinical note transcription with the patient, who gave verbal consent to proceed.  History of Present Illness   The patient, with a history of severe spinal stenosis and facet joint issues, reports a recurrence of pain approximately two to three days after their last injection. The pain is primarily located in the back, with both sides affected, and is particularly noticeable when lying down to sleep. Upon waking, the patient experiences stiffness and a sensation of poor circulation, with accompanying leg weakness that improves with movement. The patient also reports pain radiating to the hip when arching backwards and rotating the body.  The patient's leg pain, previously extending to the calf, has receded to the level of the knees on both sides. Despite some improvement in leg pain, the back pain remains the primary concern. The patient has not yet consulted with a neurosurgeon regarding their severe spinal stenosis, but is open to further interventional therapies.  The patient also reports a positive drug test for marijuana, which they attribute to secondhand smoke exposure at their workplace. They have been employed at this location for seven years and this is the first positive result. The patient denies personal use of marijuana.  The patient expresses a preference to avoid further injections if possible, and is  managing their pain with arthritis strength pills and tramadol. They are open to surgical intervention if necessary.     PMP:  Caleb Minor, MD (04/19/2023 - Hydrocodone-Acetamin 5-325 Mg (#10)) Lucita Lora  (12/21/2022 - Oxycodone Hcl (Ir) 5 Mg Tablet (#20)) Flint Melter [place], PA-C (12/17/2022 - Oxycodone Hcl (Ir) 5 Mg Tablet (#20))   Post-procedure evaluation   Type: Lumbar epidural steroid injection (LESI) (interlaminar) #1    Laterality: Right   Level:  L2-3 Level.  Imaging: Fluoroscopic guidance Spinal ([id number]) Anesthesia: Local anesthesia (1-2% Lidocaine) Anxiolysis: None                 Sedation: No Sedation                       DOS: 08/18/2023  Performed by: Caleb Done, MD  Purpose: Diagnostic/Therapeutic Indications: Lumbar radicular pain of intraspinal etiology of more than 4 weeks that has failed to respond to conservative therapy and is severe enough to impact quality of life or function. 1. Chronic low back pain (1ry area of Pain) (Bilateral) (R>L) w/o sciatica   2. Degeneration of intervertebral disc of lumbosacral region with discogenic back pain   3. Grade 1 Anterolisthesis (7-30mm) of L5 over S1   4. L4-5 disc bulge   5. Lumbar spondylosis   6. Abnormal MRI, lumbar spine (12/16/2022)   7. Chronic lower extremity pain (3ry area of Pain) (Bilateral) (R>L)   8. Chronic radicular pain of lower back   9. 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 Degeneration of intervertebral disc of lumbosacral region, unspecified whether pain present   11. Lumbar facet arthropathy    NAS-11 Pain score:   Pre-procedure: 9 /10   Post-procedure: 2 /10   (12/16/2022) LUMBAR MRI FINDINGS: Alignment: Dextroconvex lumbar curvature. Trace degenerative retrolisthesis at L2-3 and L3-4. Grade 1  anterolisthesis at L5-S1. Conus medullaris and cauda equina: disorganization at the level of L1-2 related to spinal canal stenosis. Paraspinal and  other soft tissues: Small simple renal cysts which require no follow-up imaging.  DISC LEVELS: T12-L1: Minimal disc bulging.  L1-2: Broad disc bulging with superimposed right paracentral disc extrusion results in displacement of the cauda quinine nerve roots and right-sided descending nerve impingement. There is also a in inferiorly migrated left subarticular disc extrusion contributing to nerve root displacement, and also likely resulting in descending left-sided nerve impingement. Moderate left neural foraminal stenosis. L2-3: Broad disc bulging, ligament flavum hypertrophy bilateral facet arthropathy result in moderate spinal canal and subarticular narrowing. Mild amount left and mild right neural foraminal narrowing. L3-4: Endplate spurring with broad disc bulging, ligamentum hypertrophy bilateral facet arthropathy results in moderate-severe spinal canal stenosis. Severe bilateral neural foraminal stenosis, right greater than left. L4-5: Broad disc bulging, ligament flavum hypertrophy mild facet arthropathy results in moderate-severe spinal canal stenosis, and severe bilateral neural foraminal stenosis. L5-S1: Degenerative grade 1 anterolisthesis with broad disc bulging, ligamentum hypertrophy and severe bilateral facet arthropathy. There is moderate-severe spinal canal stenosis, moderate-severe left and severe right neural foraminal stenosis.  IMPRESSION: L1-2: Broad disc bulging with superimposed right paracentral disc extrusion results in displacement of the cauda equina nerve roots and right-sided descending nerve impingement. Additional inferiorly migrated left subarticular disc extrusion likely results in descending left-sided nerve impingement. Moderate left neural foraminal stenosis. L2-3: Moderate spinal canal and subarticular narrowing. Mild left and mild right neural foraminal stenosis. L3-4: Moderate-severe spinal canal stenosis. Severe bilateral neural foraminal stenosis, right greater  than left. L4-5: Moderate-severe spinal canal stenosis. Severe bilateral neural foraminal stenosis. L5-S1: Moderate-severe spinal canal stenosis. Moderate-severe left and severe right neural foraminal stenosis.  Interventional note: It would appear that the patient had an appointment on 08/15/2023 with Dr. Cammy Copa [facility]).  According to the note the patient expressed his interest in getting injections from Dr. Alvester Morin.  In the note it is clear that they are aware that he is coming here for his injections and that he had this epidural steroid injection scheduled for today 08/18/2023.  Today also had a conversation with the patient regarding his intentions to have some of these pain management injections Edwards elsewhere and I have reminded him that it is entirely his choice as to where he has these treatments Edwards, but that we suggest that he has all of them Edwards had 1 particular practice that can keep track of the medications that he is receiving so as to avoid treatment duplication and the possible side effects and adverse reactions of overuse of some of these medications.  After I explained all of this to him, he said: "I think I will stay here".  Pharmacotherapy note: On the patient's last visit to our practice on 07/20/2023, while undergoing a routine medication management assessment, we informed the patient that we needed to have an updated urine drug screening test.  At the time the patient gave [place] the excuse that he had just voided and could not really give [place] a sample but we informed him that he no better and that we always tell the patients not to void before coming in in case and unannounced UDS is required as it was the case on 07/20/2023.  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  Upon learning the  results of the test and seem that this  represented a violation of our medication agreement, I proceeded to inform the patient on a letter sent on 07/26/2023 that we would be altering her chronic pain treatment plan.  We informed him that he would no longer include the use of controlled substances but that we would remain available for interventional therapies.  Since the patient had already received a prescription for his tramadol, he was informed that he needed to slowly taper it down and stop it since it would not longer be renewed.     Effectiveness:  Initial hour after procedure: 80 %. Subsequent 4-6 hours post-procedure: 80 %. Analgesia past initial 6 hours: 60 %. Ongoing improvement:  Analgesic: The patient indicates having attained 60% ongoing relief of his low back and lower extremity pain.  He indicates the low back pain to still be worse on the lower extremity.  However he also describes that the distribution of the pain in the lower extremities have shrunk to having pain running through the back of his leg down to the knee, but no pain below that. Function: Somewhat improved ROM: Somewhat improved  Pharmacotherapy Assessment  Analgesic: Tramadol 50 mg, 2 tabs PO q 6 hrs (400 mg/day of tramadol). No chronic opioid analgesics therapy prescribed by our practice. MME/day: 40 mg/day.   Monitoring: [place] PMP: PDMP reviewed during this encounter.       Pharmacotherapy: No side-effects or adverse reactions reported. Compliance: No problems identified. Effectiveness: Clinically acceptable.  Nonah Mattes, RN  08/29/2023  1:18 PM  Sign when Signing Visit Safety precautions to be maintained throughout the outpatient stay will include: orient to surroundings, keep bed in low position, maintain call bell within reach at all times, provide assistance with transfer out of bed and ambulation.    Pt instructed to notify pcp of increased BP today.    No results found for: "CBDTHCR" No results found for: "D8THCCBX" No results found  for: "D9THCCBX"  UDS:  Summary  Date Value Ref Range Status  07/20/2023 Note  Final    Comment:    ==================================================================== [employer] Select 13 (MW) ==================================================================== Test                             Result       Flag       Units  Drug Present and Declared for Prescription Verification   Tramadol                       >21739       EXPECTED   ng/mg creat   O-Desmethyltramadol            18430        EXPECTED   ng/mg creat   N-Desmethyltramadol            2035         EXPECTED   ng/mg creat    Source of tramadol is a prescription medication. O-desmethyltramadol    and N-desmethyltramadol are expected metabolites of tramadol.  Drug Present not Declared for Prescription Verification   Carboxy-THC                    17           UNEXPECTED ng/mg creat    Carboxy-THC is a metabolite of tetrahydrocannabinol (THC). Source of    THC is most commonly herbal marijuana or marijuana-based products,  but THC is also present in a scheduled prescription medication.    Trace amounts of THC can be present in hemp and cannabidiol (CBD)    products. This test is not intended to distinguish between delta-9-    tetrahydrocannabinol, the predominant form of THC in most herbal or    marijuana-based products, and delta-8-tetrahydrocannabinol.  ==================================================================== Test                      Result    Flag   Units      Ref Range   Creatinine              23               mg/dL      >=57 ==================================================================== Declared Medications:  The flagging and interpretation on this report are based on the  following declared medications.  Unexpected results may arise from  inaccuracies in the declared medications.   **Note: The testing scope of this panel includes these medications:   Tramadol (Ultram)   **Note: The testing  scope of this panel does not include the  following reported medications:   Acetaminophen (Tylenol)  Amlodipine (Norvasc)  Chlorthalidone (Hygroton)  Ibuprofen (Advil)  Iron  Lisinopril (Zestril)  Naloxone (Narcan)  Pantoprazole (Protonix)  Rosuvastatin (Crestor)  Sildenafil (Viagra)  Trazodone (Desyrel) ==================================================================== For clinical consultation, please call [phone]. ====================================================================       ROS  Constitutional: Denies any fever or chills Gastrointestinal: No reported hemesis, hematochezia, vomiting, or acute GI distress Musculoskeletal: Denies any acute onset joint swelling, redness, loss of ROM, or weakness Neurological: No reported episodes of acute onset apraxia, aphasia, dysarthria, agnosia, amnesia, paralysis, loss of coordination, or loss of consciousness  Medication Review  acetaminophen, amLODipine, chlorthalidone, ferrous sulfate, ibuprofen, lisinopril, naloxone, pantoprazole, rosuvastatin, sildenafil, traMADol, and traZODone  History Review  Allergy: Caleb Edwards has No Known Allergies. Drug: Caleb Edwards  reports no history of drug use. Alcohol:  reports current alcohol use of about 1.0 standard drink of alcohol per week. Tobacco:  reports that he has quit smoking. He has been exposed to tobacco smoke. He has never used smokeless tobacco. Social: Caleb Edwards  reports that he has quit smoking. He has been exposed to tobacco smoke. He has never used smokeless tobacco. He reports current alcohol use of about 1.0 standard drink of alcohol per week. He reports that he does not use drugs. Medical:  has a past medical history of Back pain, Blood transfusion without reported diagnosis (12/2015), Diverticulitis, Hypertension, Kidney stone, and Ulcer, stomach peptic, chronic. Surgical: Caleb Edwards  has a past surgical history that includes Esophagogastroduodenoscopy (egd)  with propofol (N/A, 01/03/2016); Colonoscopy with propofol (N/A, 08/02/2017); Esophagogastroduodenoscopy (egd) with propofol (08/02/2017); Esophagogastroduodenoscopy (egd) with propofol (N/A, 02/05/2018); Colonoscopy with propofol (N/A, 02/05/2018); Esophagogastroduodenoscopy (egd) with propofol (N/A, 03/30/2018); Esophagogastroduodenoscopy (egd) with propofol (N/A, 08/09/2019); Colonoscopy with propofol (N/A, 08/09/2019); Givens capsule study (N/A, 04/24/2020); Esophagogastroduodenoscopy (egd) with propofol (N/A, 04/24/2020); and Maxillary antrostomy (Left, 12/10/2022). Family: family history includes Diabetes in his mother; Heart disease in his father; Prostate cancer in his brother.  Laboratory Chemistry Profile   Renal Lab Results  Component Value Date   BUN 18 02/21/2020   CREATININE 1.24 02/21/2020   GFRAA >60 02/21/2020   GFRNONAA 60 (L) 02/21/2020    Hepatic Lab Results  Component Value Date   AST 23 08/09/2019   ALT 15 08/09/2019   ALBUMIN 3.8 08/09/2019   ALKPHOS 53 08/09/2019  LIPASE 17 08/08/2019    Electrolytes Lab Results  Component Value Date   NA 138 08/09/2019   K 3.9 08/09/2019   CL 106 08/09/2019   CALCIUM 8.7 (L) 08/09/2019   MG 1.9 01/04/2016    Bone Lab Results  Component Value Date   VD25OH 24.0 (L) 12/26/2015    Inflammation (CRP: Acute Phase) (ESR: Chronic Phase) Lab Results  Component Value Date   CRP 0.6 12/26/2015   ESRSEDRATE 3 12/26/2015         Note: Above Lab results reviewed.  Recent Imaging Review  DG PAIN CLINIC C-ARM 1-60 MIN NO REPORT Fluoro was used, but no Radiologist interpretation will be provided.  Please refer to "NOTES" tab for provider progress note. Note: Reviewed        Physical Exam  General appearance: Well nourished, well developed, and well hydrated. In no apparent acute distress Mental status: Alert, oriented x 3 (person, place, & time)       Respiratory: No evidence of acute respiratory distress Eyes: PERLA Vitals: BP (!)  142/80   Pulse 93   Temp (!) 97.3 F (36.3 C) (Temporal)   Resp 17   Ht 6' (1.829 m)   Wt 180 lb (81.6 kg)   SpO2 100%   BMI 24.41 kg/m  BMI: Estimated body mass index is 24.41 kg/m as calculated from the following:   Height as of this encounter: 6' (1.829 m).   Weight as of this encounter: 180 lb (81.6 kg). Ideal: Ideal body weight: 77.6 kg (171 lb 1.2 oz) Adjusted ideal body weight: 79.2 kg (174 lb 10.3 oz)  Physical Exam   MUSCULOSKELETAL: Able to walk on tiptoes and heels without difficulty. Pain elicited upon arching backwards, localized to the midline. Pain upon rotation and arching, with noted pain in the left hip.      Assessment   Diagnosis Status  1. Chronic low back pain (1ry area of Pain) (Bilateral) (R>L) w/o sciatica   2. Chronic hip pain (2ry area of Pain) (Bilateral) (R>L)   3. Chronic lower extremity pain (3ry area of Pain) (Bilateral) (R>L)   4. Abnormal MRI, lumbar spine (12/16/2022)   5. Postop check   6. Degeneration of intervertebral disc of lumbosacral region with discogenic back pain and lower extremity pain   7. Grade 1 Anterolisthesis (7-47mm) of L5 over S1   8. Lumbar foraminal stenosis (Bilateral) (L4-5) (L>R)   9. 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 Spinal stenosis, lumbar region, without neurogenic claudication    Controlled Controlled Controlled   Updated Problems: Problem  Spinal Stenosis, Lumbar Region, Without Neurogenic Claudication   (12/16/2022) LUMBAR MRI FINDINGS: Alignment: Dextroconvex lumbar curvature. Trace degenerative retrolisthesis at L2-3 and L3-4. Grade 1 anterolisthesis at L5-S1.  LEVELS: L1-2: Broad disc bulging with superimposed right paracentral disc extrusion results in displacement of the cauda quinine nerve roots and right-sided descending nerve impingement. There is also a in inferiorly migrated left subarticular disc extrusion contributing to nerve root displacement, and also likely resulting in  descending left-sided nerve impingement. Moderate left neural foraminal stenosis. L2-3: moderate spinal canal and subarticular narrowing L3-4: moderate-severe spinal canal stenosis L4-5: moderate-severe spinal canal stenosis L5-S1: moderate-severe spinal canal stenosis  IMPRESSION: L1-2: Broad disc bulging with superimposed right paracentral disc extrusion results in displacement of the cauda equina nerve roots and right-sided descending nerve impingement. Additional inferiorly migrated left subarticular disc extrusion likely results in descending left-sided nerve impingement. Moderate left neural foraminal stenosis. L2-3: Moderate  spinal canal and subarticular narrowing. Mild left and mild right neural foraminal stenosis. L3-4: Moderate-severe spinal canal stenosis. Severe bilateral neural foraminal stenosis L4-5: Moderate-severe spinal canal stenosis. Severe bilateral neural foraminal stenosis. L5-S1: Moderate-severe spinal canal stenosis. Moderate-severe left and severe right neural foraminal stenosis.     Plan of Care  Problem-specific:  Assessment and Plan    Severe Spinal Stenosis at L4-L5 They report back pain and leg weakness, with pain radiating to the back of the knees. An MRI confirms severe spinal stenosis at L4-L5. Decompression surgery is considered to alleviate symptoms and potentially eliminate the need for further interventional pain management. We will ensure an appointment with a neurosurgeon and follow up for evaluation.  Facet Joint Pain They experience back pain, particularly when lying on their back and upon waking, likely due to facet joint issues as indicated by MRI. We discussed a facet block and radiofrequency ablation for extended relief. They prefer to avoid multiple injections and are open to surgery if necessary. We will consider a facet block for back pain and evaluate for radiofrequency ablation if the facet block confirms the diagnosis.  Secondhand Marijuana  Smoke Exposure They tested positive for marijuana, attributed to secondhand smoke exposure at work. We explained the strict rules regarding drug tests and the importance of avoiding exposure. We advise avoiding environments with marijuana smoke exposure.       Caleb Edwards has a current medication list which includes the following long-term medication(s): chlorthalidone, lisinopril, pantoprazole, rosuvastatin, tramadol, and trazodone.  Pharmacotherapy (Medications Ordered): No orders of the defined types were placed in this encounter.  Orders:  Orders Placed This Encounter  Procedures   Ambulatory referral to Neurosurgery    Referral Priority:   Routine    Referral Type:   Surgical    Referral Reason:   Specialty Services Required    Requested Specialty:   Neurosurgery    Number of Visits Requested:   1   Nursing Instructions:    Please complete this patient's postprocedure evaluation.    Scheduling Instructions:     Please complete this patient's postprocedure evaluation.   Follow-up plan:   Return if symptoms worsen or fail to improve.      Interventional Therapies  Risk Factors  Considerations:   Stage3 CKD  HTN  Hx GI Bleed  Anemia w/ near syncope   Planned  Pending:   Diagnostic/therapeutic L1-2 LESI #1    Under consideration:   Diagnostic/therapeutic L1-2 LESI #1  Diagnostic right IA hip joint injection  Diagnostic bilateral lumbar facet block  Possible bilateral lumbar facet RFA    Completed:   Diagnostic/therapeutic left L4 TFESI x3 + left L5-S1 LESI x1 (01/06/2023) (100/100/100/95) (LLEP:100  LBP:95)  Therapeutic midline C6-7 interspinous ligament inj. x1 (06/28/2022) (80/90/75/75)  Diagnostic bilateral lumbar facet MBB x2 (12/02/2022) (100/100/0/0) did not help his left leg pain. Therapeutic left cervical ESI x2 (02/05/2021) (100/100/70 x 10 days/50)  Therapeutic right cervical ESI x1 (12/15/2021) (90/90/90/90)  Therapeutic left L3-4 LESI x2  (06/13/2018) (100/100/85/85)  Therapeutic left L3 TFESI x1 (03/13/2020) (95/95/60/>50)  Therapeutic left L4 TFESI x2 (03/13/2020) (95/95/60/>50)  Therapeutic right L3 TFESI x2 (03/13/2020) (95/95/60/>50)  Therapeutic right L4 TFESI x3 (03/13/2020) (95/95/60/>50)  Referral to neurosurgery Volanda Napoleon, MD) for cervical instability (11/11/2020)    Therapeutic  Palliative (PRN) options:   Palliative left L3-4 LESI   Palliative left L3 TFESI   Palliative left L4 TFESI   Palliative right L3 TFESI   Palliative right L4 TFESI  Recent Visits Date Type Provider Dept  08/18/23 Procedure visit Delano Metz, MD [facility]  07/20/23 Office Visit Delano Metz, MD [facility]  Showing recent visits within past 90 days and meeting all other requirements Today's Visits Date Type Provider Dept  08/29/23 Office Visit Delano Metz, MD [facility]  Showing today's visits and meeting all other requirements Future Appointments Date Type Provider Dept  09/06/23 Appointment Delano Metz, MD [facility]  Showing future appointments within next 90 days and meeting all other requirements  I discussed the assessment and treatment plan with the patient. The patient was provided an opportunity to ask questions and all were answered. The patient agreed with the plan and demonstrated an understanding of the instructions.  Patient advised to call back or seek an in-person evaluation if the symptoms or condition worsens.  Duration of encounter: 52 minutes.  Total time on encounter, as per AMA guidelines included both the face-to-face and non-face-to-face time personally spent by the physician and/or other qualified health care professional(s) on the day of the encounter (includes time in activities that require the physician or other qualified health care professional and does not include time in activities normally performed by clinical  staff). Physician's time may include the following activities when performed: Preparing to see the patient (e.g., pre-charting review of records, searching for previously ordered imaging, lab work, and nerve conduction tests) Review of prior analgesic pharmacotherapies. Reviewing PMP Interpreting ordered tests (e.g., lab work, imaging, nerve conduction tests) Performing post-procedure evaluations, including interpretation of diagnostic procedures Obtaining and/or reviewing separately obtained history Performing a medically appropriate examination and/or evaluation Counseling and educating the patient/family/caregiver Ordering medications, tests, or procedures Referring and communicating with other health care professionals (when not separately reported) Documenting clinical information in the electronic or other health record Independently interpreting results (not separately reported) and communicating results to the patient/ family/caregiver Care coordination (not separately reported)  Note by: Caleb Done, MD Date: 08/29/2023; Time: 4:14 PM

## 2023-09-05 NOTE — Progress Notes (Unsigned)
PROVIDER NOTE: Information contained herein reflects review and annotations entered in association with encounter. Interpretation of such information and data should be left to medically-trained personnel. Information provided to patient can be located elsewhere in the medical record under "Patient Instructions". Document created using STT-dictation technology, any transcriptional errors that may result from process are unintentional.    Patient: Caleb Edwards  Service Category: E/M  Provider: Oswaldo Done, MD  DOB: 20-Dec-1950  DOS: 09/06/2023  Referring Provider: Diana Eves, MD  MRN: [id number]  Specialty: Interventional Pain Management  PCP: Diana Eves, MD  Type: Established Patient  Setting: Ambulatory outpatient    Location: Office  Delivery: Face-to-face     HPI  Mr. CORDERA BORSELLINO, a 72 y.o. year old male, is here today because of his Chronic bilateral low back pain without sciatica [M54.50, G89.29]. Mr. Shade primary complain today is No chief complaint on file.  Pertinent problems: Mr. Metzen has Osteoarthritis; Chronic low back pain (1ry area of Pain) (Bilateral) (R>L) w/o sciatica; Lumbar spondylosis; Chronic lower extremity pain (3ry area of Pain) (Bilateral) (R>L); Chronic lumbar radicular pain (Left) (L4 Dermatome); Lumbar facet syndrome (Bilateral) (R>L); Diffuse myofascial pain syndrome; L4-5 disc bulge; Lumbar foraminal stenosis (Bilateral) (L4-5) (L>R); Chronic neck pain; Cervical spondylosis; Chronic pain syndrome; Abdominal pain; Grade 1 Anterolisthesis (7-54mm) of L5 over S1; DDD (degenerative disc disease), lumbosacral; Lumbar facet arthropathy; Chronic hip pain (2ry area of Pain) (Bilateral) (R>L); Cervical radiculopathy (Left); Cervicalgia; DDD (degenerative disc disease), cervical; Abnormal x-ray of cervical spine; 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); Cervical facet arthropathy (Right: C4-5, C5-6); Grade 1 Anterolisthesis of cervical spine (1-2 mm) (C4/C5); Cervical  foraminal stenosis (Right: C5-C8) (Left: C6-C7); Falls, sequela; Spondylosis without myelopathy or radiculopathy, lumbosacral region; Cervical radiculopathy (Right); Cervical spondylosis with radiculopathy; Abnormal MRI, cervical spine (11/28/2020); 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 Pain Assessment: Severity of   is reported as a  /10. Location:    / . Onset:  . Quality:  . Timing:  . Modifying factor(s):  Marland Kitchen Vitals:  vitals were not taken for this visit.  BMI: Estimated body mass index is 24.41 kg/m as calculated from the following:   Height as of 08/29/23: 6' (1.829 m).   Weight as of 08/29/23: 180 lb (81.6 kg). Last encounter: 08/29/2023. Last procedure: 08/18/2023.  Reason for encounter: post-procedure evaluation and assessment. *** Discussed the use of AI scribe software for clinical note transcription with the patient, who gave verbal consent to proceed.  History of Present Illness           Post-procedure evaluation   Type: Lumbar epidural steroid injection (LESI) (interlaminar) #1    Laterality: Right   Level:  L2-3 Level.  Imaging: Fluoroscopic guidance Spinal ([id number]) Anesthesia: Local anesthesia (1-2% Lidocaine) Anxiolysis: None                 Sedation: No Sedation                       DOS: 08/18/2023  Performed by: Oswaldo Done, MD  Purpose: Diagnostic/Therapeutic Indications: Lumbar radicular pain of intraspinal etiology of more than 4 weeks that has failed to respond to conservative therapy and is severe enough to impact quality of life or function. 1. Chronic low back pain (  1ry area of Pain) (Bilateral) (R>L) w/o sciatica   2. Degeneration of intervertebral  disc of lumbosacral region with discogenic back pain   3. Grade 1 Anterolisthesis (7-63mm) of L5 over S1   4. L4-5 disc bulge   5. Lumbar spondylosis   6. Abnormal MRI, lumbar spine (12/16/2022)   7. Chronic lower extremity pain (3ry area of Pain) (Bilateral) (R>L)   8. Chronic radicular pain of lower back   9. 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 Degeneration of intervertebral disc of lumbosacral region, unspecified whether pain present   11. Lumbar facet arthropathy    NAS-11 Pain score:   Pre-procedure: 9 /10   Post-procedure: 2 /10   (12/16/2022) LUMBAR MRI FINDINGS: Alignment: Dextroconvex lumbar curvature. Trace degenerative retrolisthesis at L2-3 and L3-4. Grade 1 anterolisthesis at L5-S1. Conus medullaris and cauda equina: disorganization at the level of L1-2 related to spinal canal stenosis. Paraspinal and other soft tissues: Small simple renal cysts which require no follow-up imaging.  DISC LEVELS: T12-L1: Minimal disc bulging.  L1-2: Broad disc bulging with superimposed right paracentral disc extrusion results in displacement of the cauda quinine nerve roots and right-sided descending nerve impingement. There is also a in inferiorly migrated left subarticular disc extrusion contributing to nerve root displacement, and also likely resulting in descending left-sided nerve impingement. Moderate left neural foraminal stenosis. L2-3: Broad disc bulging, ligament flavum hypertrophy bilateral facet arthropathy result in moderate spinal canal and subarticular narrowing. Mild amount left and mild right neural foraminal narrowing. L3-4: Endplate spurring with broad disc bulging, ligamentum hypertrophy bilateral facet arthropathy results in moderate-severe spinal canal stenosis. Severe bilateral neural foraminal stenosis, right greater than left. L4-5: Broad disc bulging, ligament flavum hypertrophy mild facet arthropathy results in moderate-severe spinal canal stenosis,  and severe bilateral neural foraminal stenosis. L5-S1: Degenerative grade 1 anterolisthesis with broad disc bulging, ligamentum hypertrophy and severe bilateral facet arthropathy. There is moderate-severe spinal canal stenosis, moderate-severe left and severe right neural foraminal stenosis.  IMPRESSION: L1-2: Broad disc bulging with superimposed right paracentral disc extrusion results in displacement of the cauda equina nerve roots and right-sided descending nerve impingement. Additional inferiorly migrated left subarticular disc extrusion likely results in descending left-sided nerve impingement. Moderate left neural foraminal stenosis. L2-3: Moderate spinal canal and subarticular narrowing. Mild left and mild right neural foraminal stenosis. L3-4: Moderate-severe spinal canal stenosis. Severe bilateral neural foraminal stenosis, right greater than left. L4-5: Moderate-severe spinal canal stenosis. Severe bilateral neural foraminal stenosis. L5-S1: Moderate-severe spinal canal stenosis. Moderate-severe left and severe right neural foraminal stenosis.  Interventional note: It would appear that the patient had an appointment on 08/15/2023 with Dr. Cammy Copa [facility]).  According to the note the patient expressed his interest in getting injections from Dr. Alvester Morin.  In the note it is clear that they are aware that he is coming here for his injections and that he had this epidural steroid injection scheduled for today 08/18/2023.  Today also had a conversation with the patient regarding his intentions to have some of these pain management injections done elsewhere and I have reminded him that it is entirely his choice as to where he has these treatments done, but that we suggest that he has all of them done had 1 particular practice that can keep track of the medications that he is receiving so as to avoid treatment duplication and the possible side effects and adverse reactions of  overuse of some of these medications.  After I  explained all of this to him, he said: "I think I will stay here".  Pharmacotherapy note: On the patient's last visit to our practice on 07/20/2023, while undergoing a routine medication management assessment, we informed the patient that we needed to have an updated urine drug screening test.  At the time the patient gave [place] the excuse that he had just voided and could not really give [place] a sample but we informed him that he no better and that we always tell the patients not to void before coming in in case and unannounced UDS is required as it was the case on 07/20/2023.  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  Upon learning the results of the test and seem that this represented a violation of our medication agreement, I proceeded to inform the patient on a letter sent on 07/26/2023 that we would be altering her chronic pain treatment plan.  We informed him that he would no longer include the use of controlled substances but that we would remain available for interventional therapies.  Since the patient had already received a prescription for his tramadol, he was informed that he needed to slowly taper it down and stop it since it would not longer be renewed.      Effectiveness:  Initial hour after procedure:   ***. Subsequent 4-6 hours post-procedure:   ***. Analgesia past initial 6 hours:   ***. Ongoing improvement:  Analgesic:  *** Function:    ***    ROM:    ***     Pharmacotherapy Assessment  Analgesic: Tramadol 50 mg, 2 tabs PO q 6 hrs (400 mg/day of tramadol). No chronic opioid analgesics therapy prescribed by our practice. MME/day: 40 mg/day.   Monitoring: [place] PMP: PDMP reviewed during this encounter.       Pharmacotherapy: No side-effects or adverse reactions reported. Compliance: No problems  identified. Effectiveness: Clinically acceptable.  No notes on file  No results found for: "CBDTHCR" No results found for: "D8THCCBX" No results found for: "D9THCCBX"  UDS:  Summary  Date Value Ref Range Status  07/20/2023 Note  Final    Comment:    ==================================================================== [employer] Select 13 (MW) ==================================================================== Test                             Result       Flag       Units  Drug Present and Declared for Prescription Verification   Tramadol                       >21739       EXPECTED   ng/mg creat   O-Desmethyltramadol            18430        EXPECTED   ng/mg creat   N-Desmethyltramadol            2035         EXPECTED   ng/mg creat    Source of tramadol is a prescription medication. O-desmethyltramadol    and N-desmethyltramadol are expected metabolites of tramadol.  Drug Present not Declared for Prescription Verification   Carboxy-THC                    17  UNEXPECTED ng/mg creat    Carboxy-THC is a metabolite of tetrahydrocannabinol (THC). Source of    THC is most commonly herbal marijuana or marijuana-based products,    but THC is also present in a scheduled prescription medication.    Trace amounts of THC can be present in hemp and cannabidiol (CBD)    products. This test is not intended to distinguish between delta-9-    tetrahydrocannabinol, the predominant form of THC in most herbal or    marijuana-based products, and delta-8-tetrahydrocannabinol.  ==================================================================== Test                      Result    Flag   Units      Ref Range   Creatinine              23               mg/dL      >=40 ==================================================================== Declared Medications:  The flagging and interpretation on this report are based on the  following declared medications.  Unexpected results may arise from   inaccuracies in the declared medications.   **Note: The testing scope of this panel includes these medications:   Tramadol (Ultram)   **Note: The testing scope of this panel does not include the  following reported medications:   Acetaminophen (Tylenol)  Amlodipine (Norvasc)  Chlorthalidone (Hygroton)  Ibuprofen (Advil)  Iron  Lisinopril (Zestril)  Naloxone (Narcan)  Pantoprazole (Protonix)  Rosuvastatin (Crestor)  Sildenafil (Viagra)  Trazodone (Desyrel) ==================================================================== For clinical consultation, please call [phone]. ====================================================================       ROS  Constitutional: Denies any fever or chills Gastrointestinal: No reported hemesis, hematochezia, vomiting, or acute GI distress Musculoskeletal: Denies any acute onset joint swelling, redness, loss of ROM, or weakness Neurological: No reported episodes of acute onset apraxia, aphasia, dysarthria, agnosia, amnesia, paralysis, loss of coordination, or loss of consciousness  Medication Review  acetaminophen, amLODipine, chlorthalidone, ferrous sulfate, ibuprofen, lisinopril, naloxone, pantoprazole, rosuvastatin, sildenafil, traMADol, and traZODone  History Review  Allergy: Mr. Hertzler has No Known Allergies. Drug: Mr. Dombrowski  reports no history of drug use. Alcohol:  reports current alcohol use of about 1.0 standard drink of alcohol per week. Tobacco:  reports that he has quit smoking. He has been exposed to tobacco smoke. He has never used smokeless tobacco. Social: Mr. Vlasic  reports that he has quit smoking. He has been exposed to tobacco smoke. He has never used smokeless tobacco. He reports current alcohol use of about 1.0 standard drink of alcohol per week. He reports that he does not use drugs. Medical:  has a past medical history of Back pain, Blood transfusion without reported diagnosis (12/2015), Diverticulitis,  Hypertension, Kidney stone, and Ulcer, stomach peptic, chronic. Surgical: Mr. Thwaites  has a past surgical history that includes Esophagogastroduodenoscopy (egd) with propofol (N/A, 01/03/2016); Colonoscopy with propofol (N/A, 08/02/2017); Esophagogastroduodenoscopy (egd) with propofol (08/02/2017); Esophagogastroduodenoscopy (egd) with propofol (N/A, 02/05/2018); Colonoscopy with propofol (N/A, 02/05/2018); Esophagogastroduodenoscopy (egd) with propofol (N/A, 03/30/2018); Esophagogastroduodenoscopy (egd) with propofol (N/A, 08/09/2019); Colonoscopy with propofol (N/A, 08/09/2019); Givens capsule study (N/A, 04/24/2020); Esophagogastroduodenoscopy (egd) with propofol (N/A, 04/24/2020); and Maxillary antrostomy (Left, 12/10/2022). Family: family history includes Diabetes in his mother; Heart disease in his father; Prostate cancer in his brother.  Laboratory Chemistry Profile   Renal Lab Results  Component Value Date   BUN 18 02/21/2020   CREATININE 1.24 02/21/2020   GFRAA >60 02/21/2020   GFRNONAA 60 (L) 02/21/2020  Hepatic Lab Results  Component Value Date   AST 23 08/09/2019   ALT 15 08/09/2019   ALBUMIN 3.8 08/09/2019   ALKPHOS 53 08/09/2019   LIPASE 17 08/08/2019    Electrolytes Lab Results  Component Value Date   NA 138 08/09/2019   K 3.9 08/09/2019   CL 106 08/09/2019   CALCIUM 8.7 (L) 08/09/2019   MG 1.9 01/04/2016    Bone Lab Results  Component Value Date   VD25OH 24.0 (L) 12/26/2015    Inflammation (CRP: Acute Phase) (ESR: Chronic Phase) Lab Results  Component Value Date   CRP 0.6 12/26/2015   ESRSEDRATE 3 12/26/2015         Note: Above Lab results reviewed.  Recent Imaging Review  DG PAIN CLINIC C-ARM 1-60 MIN NO REPORT Fluoro was used, but no Radiologist interpretation will be provided.  Please refer to "NOTES" tab for provider progress note. Note: Reviewed        Physical Exam  General appearance: Well nourished, well developed, and well hydrated. In no apparent  acute distress Mental status: Alert, oriented x 3 (person, place, & time)       Respiratory: No evidence of acute respiratory distress Eyes: PERLA Vitals: There were no vitals taken for this visit. BMI: Estimated body mass index is 24.41 kg/m as calculated from the following:   Height as of 08/29/23: 6' (1.829 m).   Weight as of 08/29/23: 180 lb (81.6 kg). Ideal: Ideal body weight: 77.6 kg (171 lb 1.2 oz) Adjusted ideal body weight: 79.2 kg (174 lb 10.3 oz)  Assessment   Diagnosis Status  1. Chronic low back pain (1ry area of Pain) (Bilateral) (R>L) w/o sciatica   2. Chronic hip pain (2ry area of Pain) (Bilateral) (R>L)   3. Chronic lower extremity pain (3ry area of Pain) (Bilateral) (R>L)   4. Chronic lumbar radicular pain (Left) (L4 Dermatome)   5. Postop check    Controlled Controlled Controlled   Updated Problems: No problems updated.  Plan of Care  Problem-specific:  Assessment and Plan            Mr. RUSTON RAPOZO has a current medication list which includes the following long-term medication(s): chlorthalidone, lisinopril, pantoprazole, rosuvastatin, tramadol, and trazodone.  Pharmacotherapy (Medications Ordered): No orders of the defined types were placed in this encounter.  Orders:  No orders of the defined types were placed in this encounter.  Follow-up plan:   No follow-ups on file.      Interventional Therapies  Risk Factors  Considerations:   Stage3 CKD  HTN  Hx GI Bleed  Anemia w/ near syncope   Planned  Pending:   Diagnostic/therapeutic L1-2 LESI #1    Under consideration:   Diagnostic/therapeutic L1-2 LESI #1  Diagnostic right IA hip joint injection  Diagnostic bilateral lumbar facet block  Possible bilateral lumbar facet RFA    Completed:   Diagnostic/therapeutic left L4 TFESI x3 + left L5-S1 LESI x1 (01/06/2023) (100/100/100/95) (LLEP:100  LBP:95)  Therapeutic midline C6-7 interspinous ligament inj. x1 (06/28/2022) (80/90/75/75)   Diagnostic bilateral lumbar facet MBB x2 (12/02/2022) (100/100/0/0) did not help his left leg pain. Therapeutic left cervical ESI x2 (02/05/2021) (100/100/70 x 10 days/50)  Therapeutic right cervical ESI x1 (12/15/2021) (90/90/90/90)  Therapeutic left L3-4 LESI x2 (06/13/2018) (100/100/85/85)  Therapeutic left L3 TFESI x1 (03/13/2020) (95/95/60/>50)  Therapeutic left L4 TFESI x2 (03/13/2020) (95/95/60/>50)  Therapeutic right L3 TFESI x2 (03/13/2020) (95/95/60/>50)  Therapeutic right L4 TFESI x3 (03/13/2020) (95/95/60/>50)  Referral to  neurosurgery Volanda Napoleon, MD) for cervical instability (11/11/2020)    Therapeutic  Palliative (PRN) options:   Palliative left L3-4 LESI   Palliative left L3 TFESI   Palliative left L4 TFESI   Palliative right L3 TFESI   Palliative right L4 TFESI         Recent Visits Date Type Provider Dept  08/29/23 Office Visit Delano Metz, MD [facility]  08/18/23 Procedure visit Delano Metz, MD [facility]  07/20/23 Office Visit Delano Metz, MD [facility]  Showing recent visits within past 90 days and meeting all other requirements Future Appointments Date Type Provider Dept  09/06/23 Appointment Delano Metz, MD [facility]  Showing future appointments within next 90 days and meeting all other requirements  I discussed the assessment and treatment plan with the patient. The patient was provided an opportunity to ask questions and all were answered. The patient agreed with the plan and demonstrated an understanding of the instructions.  Patient advised to call back or seek an in-person evaluation if the symptoms or condition worsens.  Duration of encounter: *** minutes.  Total time on encounter, as per AMA guidelines included both the face-to-face and non-face-to-face time personally spent by the physician and/or other qualified health care professional(s) on the day of the encounter  (includes time in activities that require the physician or other qualified health care professional and does not include time in activities normally performed by clinical staff). Physician's time may include the following activities when performed: Preparing to see the patient (e.g., pre-charting review of records, searching for previously ordered imaging, lab work, and nerve conduction tests) Review of prior analgesic pharmacotherapies. Reviewing PMP Interpreting ordered tests (e.g., lab work, imaging, nerve conduction tests) Performing post-procedure evaluations, including interpretation of diagnostic procedures Obtaining and/or reviewing separately obtained history Performing a medically appropriate examination and/or evaluation Counseling and educating the patient/family/caregiver Ordering medications, tests, or procedures Referring and communicating with other health care professionals (when not separately reported) Documenting clinical information in the electronic or other health record Independently interpreting results (not separately reported) and communicating results to the patient/ family/caregiver Care coordination (not separately reported)  Note by: Oswaldo Done, MD Date: 09/06/2023; Time: 9:48 AM

## 2023-09-06 ENCOUNTER — Ambulatory Visit (HOSPITAL_BASED_OUTPATIENT_CLINIC_OR_DEPARTMENT_OTHER): Payer: Medicare HMO | Admitting: Pain Medicine

## 2023-09-06 DIAGNOSIS — M545 Low back pain, unspecified: Secondary | ICD-10-CM

## 2023-09-06 DIAGNOSIS — Z91199 Patient's noncompliance with other medical treatment and regimen due to unspecified reason: Secondary | ICD-10-CM

## 2023-09-06 DIAGNOSIS — G8929 Other chronic pain: Secondary | ICD-10-CM

## 2023-09-06 DIAGNOSIS — Z09 Encounter for follow-up examination after completed treatment for conditions other than malignant neoplasm: Secondary | ICD-10-CM

## 2023-12-14 ENCOUNTER — Encounter: Payer: Medicare HMO | Admitting: Pain Medicine

## 2024-01-15 ENCOUNTER — Other Ambulatory Visit: Payer: Self-pay | Admitting: Pain Medicine

## 2024-01-15 DIAGNOSIS — Z79899 Other long term (current) drug therapy: Secondary | ICD-10-CM

## 2024-01-15 DIAGNOSIS — M4310 Spondylolisthesis, site unspecified: Secondary | ICD-10-CM

## 2024-01-15 DIAGNOSIS — F1120 Opioid dependence, uncomplicated: Secondary | ICD-10-CM

## 2024-01-15 DIAGNOSIS — M5459 Other low back pain: Secondary | ICD-10-CM

## 2024-01-15 DIAGNOSIS — G8929 Other chronic pain: Secondary | ICD-10-CM

## 2024-01-15 DIAGNOSIS — Z79891 Long term (current) use of opiate analgesic: Secondary | ICD-10-CM

## 2024-01-15 DIAGNOSIS — G894 Chronic pain syndrome: Secondary | ICD-10-CM

## 2024-01-15 DIAGNOSIS — M51379 Other intervertebral disc degeneration, lumbosacral region without mention of lumbar back pain or lower extremity pain: Secondary | ICD-10-CM

## 2024-01-15 DIAGNOSIS — M47817 Spondylosis without myelopathy or radiculopathy, lumbosacral region: Secondary | ICD-10-CM

## 2024-01-15 DIAGNOSIS — M51369 Other intervertebral disc degeneration, lumbar region without mention of lumbar back pain or lower extremity pain: Secondary | ICD-10-CM

## 2024-01-15 DIAGNOSIS — M7918 Myalgia, other site: Secondary | ICD-10-CM

## 2024-01-15 DIAGNOSIS — R937 Abnormal findings on diagnostic imaging of other parts of musculoskeletal system: Secondary | ICD-10-CM

## 2024-01-15 DIAGNOSIS — M47816 Spondylosis without myelopathy or radiculopathy, lumbar region: Secondary | ICD-10-CM

## 2024-01-16 ENCOUNTER — Encounter: Payer: Self-pay | Admitting: Internal Medicine

## 2024-01-19 ENCOUNTER — Emergency Department (HOSPITAL_COMMUNITY)
Admission: EM | Admit: 2024-01-19 | Discharge: 2024-01-19 | Disposition: A | Discharge status: Home / Self Care | Payer: Medicare (Managed Care) | Attending: Emergency Medicine | Admitting: Emergency Medicine

## 2024-01-19 ENCOUNTER — Encounter (HOSPITAL_COMMUNITY): Payer: Self-pay

## 2024-01-19 ENCOUNTER — Emergency Department (HOSPITAL_COMMUNITY): Payer: Medicare (Managed Care)

## 2024-01-19 ENCOUNTER — Encounter: Payer: Self-pay | Admitting: Internal Medicine

## 2024-01-19 ENCOUNTER — Other Ambulatory Visit: Payer: Self-pay

## 2024-01-19 DIAGNOSIS — M549 Dorsalgia, unspecified: Secondary | ICD-10-CM | POA: Diagnosis present

## 2024-01-19 DIAGNOSIS — M545 Low back pain, unspecified: Secondary | ICD-10-CM | POA: Insufficient documentation

## 2024-01-19 DIAGNOSIS — N189 Chronic kidney disease, unspecified: Secondary | ICD-10-CM | POA: Insufficient documentation

## 2024-01-19 DIAGNOSIS — Y990 Civilian activity done for income or pay: Secondary | ICD-10-CM | POA: Diagnosis not present

## 2024-01-19 DIAGNOSIS — I129 Hypertensive chronic kidney disease with stage 1 through stage 4 chronic kidney disease, or unspecified chronic kidney disease: Secondary | ICD-10-CM | POA: Insufficient documentation

## 2024-01-19 DIAGNOSIS — Z79899 Other long term (current) drug therapy: Secondary | ICD-10-CM | POA: Diagnosis not present

## 2024-01-19 DIAGNOSIS — G8929 Other chronic pain: Secondary | ICD-10-CM | POA: Insufficient documentation

## 2024-01-19 DIAGNOSIS — X503XXA Overexertion from repetitive movements, initial encounter: Secondary | ICD-10-CM | POA: Diagnosis not present

## 2024-01-19 LAB — CBC WITH DIFFERENTIAL/PLATELET
Abs Immature Granulocytes: 0.04 10*3/uL (ref 0.00–0.07)
Basophils Absolute: 0.1 10*3/uL (ref 0.0–0.1)
Basophils Relative: 1 %
Eosinophils Absolute: 0.3 10*3/uL (ref 0.0–0.5)
Eosinophils Relative: 3 %
HCT: 47.1 % (ref 39.0–52.0)
Hemoglobin: 15 g/dL (ref 13.0–17.0)
Immature Granulocytes: 0 %
Lymphocytes Relative: 24 %
Lymphs Abs: 2.2 10*3/uL (ref 0.7–4.0)
MCH: 30.9 pg (ref 26.0–34.0)
MCHC: 31.8 g/dL (ref 30.0–36.0)
MCV: 97.1 fL (ref 80.0–100.0)
Monocytes Absolute: 0.9 10*3/uL (ref 0.1–1.0)
Monocytes Relative: 10 %
Neutro Abs: 5.7 10*3/uL (ref 1.7–7.7)
Neutrophils Relative %: 62 %
Platelets: 213 10*3/uL (ref 150–400)
RBC: 4.85 MIL/uL (ref 4.22–5.81)
RDW: 12.7 % (ref 11.5–15.5)
WBC: 9.2 10*3/uL (ref 4.0–10.5)
nRBC: 0 % (ref 0.0–0.2)

## 2024-01-19 LAB — BASIC METABOLIC PANEL WITH GFR
Anion gap: 11 (ref 5–15)
BUN: 14 mg/dL (ref 8–23)
CO2: 23 mmol/L (ref 22–32)
Calcium: 9.5 mg/dL (ref 8.9–10.3)
Chloride: 103 mmol/L (ref 98–111)
Creatinine, Ser: 1.25 mg/dL — ABNORMAL HIGH (ref 0.61–1.24)
GFR, Estimated: >60 mL/min (ref >60)
Glucose, Bld: 94 mg/dL (ref 70–99)
Potassium: 3.9 mmol/L (ref 3.5–5.1)
Sodium: 137 mmol/L (ref 135–145)

## 2024-01-19 MED ORDER — CYCLOBENZAPRINE HCL 10 MG PO TABS
5.0000 mg | ORAL_TABLET | Freq: Once | ORAL | Status: AC
  Administered 2024-01-19: 5 mg via ORAL
  Filled 2024-01-19: qty 1

## 2024-01-19 MED ORDER — LIDOCAINE 5 % EX PTCH
1.0000 | MEDICATED_PATCH | Freq: Once | CUTANEOUS | Status: DC
Start: 2024-01-19 — End: 2024-01-20
  Administered 2024-01-19: 1 via TRANSDERMAL
  Filled 2024-01-19: qty 1

## 2024-01-19 MED ORDER — ACETAMINOPHEN 325 MG PO TABS
650.0000 mg | ORAL_TABLET | Freq: Once | ORAL | Status: AC
  Administered 2024-01-19: 650 mg via ORAL
  Filled 2024-01-19: qty 2

## 2024-01-19 MED ORDER — LIDOCAINE 5 % EX PTCH: 1.0000 | MEDICATED_PATCH | CUTANEOUS | 0 refills | Status: AC

## 2024-01-19 MED ORDER — CYCLOBENZAPRINE HCL 10 MG PO TABS: 5.0000 mg | ORAL_TABLET | Freq: Two times a day (BID) | ORAL | 0 refills | Status: AC | PRN

## 2024-01-19 MED ORDER — OXYCODONE-ACETAMINOPHEN 5-325 MG PO TABS
1.0000 | ORAL_TABLET | Freq: Once | ORAL | Status: AC
  Administered 2024-01-19: 1 via ORAL
  Filled 2024-01-19: qty 1

## 2024-01-19 NOTE — ED Provider Notes (Signed)
 [place] EMERGENCY DEPARTMENT AT [facility] Provider Note   CSN: [id number] Arrival date & time: 01/19/24  1756     History  Chief Complaint  Patient presents with   Back Pain    Caleb Edwards is a 73 y.o. male.  Patient is a 73 year old male with past medical history of chronic back pain, hypertension, CKD presenting to the emergency department with acute on chronic back pain.  Patient states over the last 3 days his back pain has worsened from his baseline.  He states he has been taking Tylenol without improvement.  He states he was previously on tramadol but ran out a while ago.  He was seeing pain management and getting injections in his back does not have a follow-up appointment with them for another 10 days and could not manage the pain at home.  He denies any new trauma or falls or recent heavy lifting but states that he does do a lot of repetitive activity at work that may have exacerbated it.  He denies any numbness or weakness, saddle anesthesia, loss of bowel or bladder function.  Denies any fevers.  The history is provided by the patient.  Back Pain      Home Medications Prior to Admission medications   Medication Sig Start Date End Date Taking? Authorizing Provider  cyclobenzaprine (FLEXERIL) 10 MG tablet Take 0.5 tablets (5 mg total) by mouth 2 (two) times daily as needed for muscle spasms. 01/19/24  Yes Nora Beal, Sanjuana Mruk K, DO  lidocaine (LIDODERM) 5 % Place 1 patch onto the skin daily. Remove & Discard patch within 12 hours or as directed by MD 01/19/24  Yes Nora Beal, Peony Barner K, DO  acetaminophen (TYLENOL) 650 MG CR tablet Take 650 mg by mouth every 8 (eight) hours as needed for pain.    [provider]  amLODipine (NORVASC) 10 MG tablet Take 1 tablet by mouth daily. 12/31/17   [provider]  chlorthalidone (HYGROTON) 25 MG tablet Take 25 mg by mouth daily. 04/02/22   [provider]  ferrous sulfate 325 (65 FE) MG tablet  Take 325 mg by mouth daily with breakfast.    [provider]  ibuprofen (ADVIL) 600 MG tablet Take 600 mg by mouth every 8 (eight) hours as needed. 06/02/22   [provider]  lisinopril (PRINIVIL,ZESTRIL) 40 MG tablet Take 1 tablet by mouth daily. 12/31/17   [provider]  naloxone [facility]) nasal spray 4 mg/0.1 mL Place 1 spray into the nose as needed for up to 365 doses (for opioid-induced respiratory depresssion). In case of emergency (overdose), spray once into each nostril. If no response within 3 minutes, repeat application and call 911. 07/20/23 07/19/24  Renaldo Caroli, MD  pantoprazole (PROTONIX) 40 MG tablet Take 1 tablet (40 mg total) by mouth 2 (two) times daily before a meal. 04/16/21   Luke Salaam, MD  rosuvastatin (CRESTOR) 10 MG tablet Take 10 mg by mouth daily. 04/26/22   [provider]  sildenafil (VIAGRA) 100 MG tablet Take 100 mg by mouth daily as needed for erectile dysfunction.    [provider]  traMADol (ULTRAM) 50 MG tablet Take 2 tablets (100 mg total) by mouth every 6 (six) hours. Each refill must last 30 days. 07/23/23 12/20/23  Renaldo Caroli, MD  traZODone (DESYREL) 50 MG tablet 50 mg at bedtime as needed. 07/18/20   [provider]      Allergies    Patient has no known allergies.  Review of Systems   Review of Systems  Musculoskeletal:  Positive for back pain.    Physical Exam Updated Vital Signs BP 131/78 (BP Location: Right Arm)   Pulse 69   Temp 98.3 F (36.8 C) (Oral)   Resp 14   Ht 6' (1.829 m)   Wt 79.4 kg   SpO2 97%   BMI 23.73 kg/m  Physical Exam Vitals and nursing note reviewed.  Constitutional:      General: He is not in acute distress.    Appearance: Normal appearance.  HENT:     Head: Normocephalic and atraumatic.     Nose: Nose normal.     Mouth/Throat:     Mouth: Mucous membranes are moist.  Eyes:     Extraocular Movements: Extraocular movements intact.   Cardiovascular:     Rate and Rhythm: Normal rate and regular rhythm.     Heart sounds: Normal heart sounds.  Pulmonary:     Effort: Pulmonary effort is normal.     Breath sounds: Normal breath sounds.  Abdominal:     General: Abdomen is flat.     Palpations: Abdomen is soft.     Tenderness: There is no abdominal tenderness. There is no right CVA tenderness or left CVA tenderness.  Musculoskeletal:        General: Normal range of motion.     Cervical back: Normal range of motion.     Comments: No midline back tenderness Bilateral lumbar paraspinal muscle tenderness to palpation Negative straight leg raise bilaterally  Skin:    General: Skin is warm and dry.  Neurological:     General: No focal deficit present.     Mental Status: He is alert and oriented to person, place, and time.     Sensory: No sensory deficit.     Motor: No weakness (5 out of 5 strength in bilateral hip flexion, knee extension, plantar/dorsi flexion).     Gait: Gait normal.  Psychiatric:        Mood and Affect: Mood normal.        Behavior: Behavior normal.     ED Results / Procedures / Treatments   Labs (all labs ordered are listed, but only abnormal results are displayed) Labs Reviewed  BASIC METABOLIC PANEL WITH GFR - Abnormal; Notable for the following components:      Result Value   Creatinine, Ser 1.25 (*)    All other components within normal limits  CBC WITH DIFFERENTIAL/PLATELET    EKG None  Radiology CT Lumbar Spine Wo Contrast Result Date: 01/19/2024 CLINICAL DATA:  Low back pain EXAM: CT LUMBAR SPINE WITHOUT CONTRAST TECHNIQUE: Multidetector CT imaging of the lumbar spine was performed without intravenous contrast administration. Multiplanar CT image reconstructions were also generated. RADIATION DOSE REDUCTION: This exam was performed according to the departmental dose-optimization program which includes automated exposure control, adjustment of the mA and/or kV according to patient size  and/or use of iterative reconstruction technique. COMPARISON:  MRI lumbar spine 12/14/2022. FINDINGS: Segmentation: 5 lumbar type vertebrae. Alignment: There is mild dextroconvex curvature of the lumbar spine. There is 6 mm of anterolisthesis at L5-S1 which is similar to prior. Alignment is otherwise anatomic. Vertebrae: There is mild chronic compression deformity of the superior endplate of T12 which is new from the prior MRI, but favored as chronic. No focal osseous lesions are identified. There is disc space narrowing and endplate osteophyte formation at L1-L2, L2-L3, L3-L4 and L5-S1. endplate osteophytes are seen throughout. Paraspinal and other soft  tissues: Bilateral renal calculi are present. Tubal soft tissues are within normal limits. Disc levels: T12-L1: There is mild broad-based disc bulge. There is no central canal or neural foraminal stenosis. L1-L2: Broad-based disc bulge with bilateral facet arthropathy. Moderate central canal stenosis. No neural foraminal stenosis. L2-L3: Broad-based disc bulge with bilateral facet arthropathy. Mild central canal stenosis. L3-L4: Posterior disc osteophyte complex with thickening of the ligamentum flavum and bilateral facet arthropathy. Moderate central canal stenosis. Mild bilateral neural foraminal stenosis. L4-L5: Broad-based disc bulge with thickening of ligamentum flavum and bilateral facet arthropathy. Moderate central canal stenosis. Moderate bilateral neural foraminal stenosis, right greater than left. L5-S1: Broad-based disc bulge with bilateral facet arthropathy. Moderate severe bilateral neural foraminal stenosis. Moderate severe central canal stenosis. IMPRESSION: 1. Mild chronic compression deformity of the superior endplate of T12, new from the prior MRI, but favored as chronic. 2. Multilevel degenerative changes of the lumbar spine with moderate central canal stenosis at L1-L2, L3-L4 and L4-L5 and moderate severe central canal stenosis at L5-S1. 3.  Multilevel neural foraminal stenosis, moderate severe bilaterally at L5-S1. 4. Bilateral renal calculi. Electronically Signed   By: Tyron Gallon M.D.   On: 01/19/2024 21:06    Procedures Procedures    Medications Ordered in ED Medications  lidocaine (LIDODERM) 5 % 1-3 patch (1 patch Transdermal Patch Applied 01/19/24 2150)  oxyCODONE-acetaminophen (PERCOCET/ROXICET) 5-325 MG per tablet 1 tablet (1 tablet Oral Given 01/19/24 1900)  acetaminophen (TYLENOL) tablet 650 mg (650 mg Oral Given 01/19/24 2149)  cyclobenzaprine (FLEXERIL) tablet 5 mg (5 mg Oral Given 01/19/24 2148)    ED Course/ Medical Decision Making/ A&P Clinical Course as of 01/19/24 2300  Thu Jan 19, 2024  2122 CT with likely chronic compression of T12, spinal stenosis. Patient will be given additional pain control. Will not be given refill of narcotics but recommended close pain management follow up as scheduled. [VK]    Clinical Course User Index [VK] Kingsley, Narcisa Ganesh K, DO                                 Medical Decision Making This patient presents to the ED with chief complaint(s) of back pain with pertinent past medical history of chronic back pain, hypertension, CKD which further complicates the presenting complaint. The complaint involves an extensive differential diagnosis and also carries with it a high risk of complications and morbidity.    The differential diagnosis includes muscle strain or spasm, no neurologic deficits, saddle anesthesia or loss of bowel or bladder function making cauda equina unlikely, no fever or recent spinal manipulation making epidural abscess unlikely, no significant trauma making fracture unlikely  Additional history obtained: Additional history obtained from N/A Records reviewed pain management records - tramadol prescription not renewed due to UDS positive for [facility]  ED Course and Reassessment: On patient's arrival he is hemodynamically stable in no acute distress.  He was initially  evaluated by provider in triage and had labs and CT imaging performed.  Patient's labs are approximately at his baseline, CT showed chronic T12 compression fracture and significant degenerative changes.  He has no neurologic deficits on exam, was given a Percocet in triage with some improvement and will be given additional Tylenol, Flexeril and lidocaine patch.  Per patient's recent pain management appointment was not given a refill of his tramadol due to UDS positive for THC, will not refill his opioids today and deferred this to pain management for  which she has an appointment at the end of this month.  He is stable for discharge, was given strict return precautions.  Independent labs interpretation:  The following labs were independently interpreted: At baseline  Independent visualization of imaging: - I independently visualized the following imaging with scope of interpretation limited to determining acute life threatening conditions related to emergency care: CT lumbar spine, which revealed significant degenerative changes, chronic T12 compression fracture  Consultation: - Consulted or discussed management/test interpretation w/ external professional: N/A  Consideration for admission or further workup: Patient has no emergent conditions requiring admission or further work-up at this time and is stable for discharge home with primary care and pain management follow-up  Social Determinants of health: N/A    Risk OTC drugs. Prescription drug management.          Final Clinical Impression(s) / ED Diagnoses Final diagnoses:  Chronic bilateral low back pain without sciatica    Rx / DC Orders ED Discharge Orders          Ordered    cyclobenzaprine (FLEXERIL) 10 MG tablet  2 times daily PRN        01/19/24 2259    lidocaine (LIDODERM) 5 %  Every 24 hours        01/19/24 2259              Kingsley, Jillian Warth K, DO 01/19/24 2300

## 2024-01-19 NOTE — ED Triage Notes (Signed)
 Patient stated he has extreme middle lower back pain that moves down both legs. No falls. Has bulging discs in his back.

## 2024-01-19 NOTE — Discharge Instructions (Addendum)
 You were seen in the emergency department for your back pain.  You had no new injuries to your back but have significant chronic disease in your back.  You can follow-up with your primary doctor to have your symptoms rechecked and follow-up with pain management as scheduled for further management.  You can take Tylenol every 6 hours as needed for pain and ibuprofen occasionally.  You can take Flexeril as needed for breakthrough pain.  This can make you drowsy do not take before driving, working or operating heavy machinery.  You can use the lidocaine patches, ice or heat and should try to stretch the muscles in your back.  You should return to the emergency department if you are unable to urinate, unable to walk, getting numbness or weakness progressive in your legs or any other new or concerning symptoms.

## 2024-01-19 NOTE — ED Provider Triage Note (Signed)
 Emergency Medicine Provider Triage Evaluation Note  Caleb Edwards , a 73 y.o. male  was evaluated in triage.  Pt complains of back pain. States pain started to worsen over the past several days. History of low back pain in the past. Feeling of pins and needles in bilateral legs. Able to ambulate independently.  Review of Systems  Positive: Back pain Negative: IVDU, saddle anesthesia, inability to walk  Physical Exam  BP (!) 153/88   Pulse 86   Temp 98.3 F (36.8 C) (Oral)   Resp 16   Ht 6' (1.829 m)   Wt 79.4 kg   SpO2 100%   BMI 23.73 kg/m  Gen:   Awake, no distress   Resp:  Normal effort  MSK:   Moves extremities without difficulty  Other:    Medical Decision Making  Medically screening exam initiated at 6:41 PM.  Appropriate orders placed.  Caleb Edwards was informed that the remainder of the evaluation will be completed by another provider, this initial triage assessment does not replace that evaluation, and the importance of remaining in the ED until their evaluation is complete.    Sonnie Dusky, PA-C 01/19/24 1844

## 2024-01-29 NOTE — Progress Notes (Unsigned)
 PROVIDER NOTE: Interpretation of information contained herein should be left to medically-trained personnel. Specific patient instructions are provided elsewhere under "Patient Instructions" section of medical record. This document was created in part using AI and STT-dictation technology, any transcriptional errors that may result from this process are unintentional.  Patient: Caleb Edwards  Service: E/M   PCP: Caleb Deem, MD  DOB: [birth year]  DOS: 01/30/2024  Provider: Candi Chafe, MD  MRN: [id number]  Delivery: Face-to-face  Specialty: Interventional Pain Management  Type: Established Patient  Setting: Ambulatory outpatient facility  Specialty designation: 09  Referring Prov.: Caleb Deem, MD  Location: Outpatient office facility       HPI  [name], a 73 y.o. year old male, is here today because of his No primary diagnosis found.. [name] primary complain today is No chief complaint on file.  Pertinent problems: 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) (L>R); Chronic neck pain; Cervical spondylosis; Chronic pain syndrome; Abdominal pain; Grade 1 Anterolisthesis (7-70mm) of L5 over S1; DDD (degenerative disc disease), lumbosacral; Lumbar facet arthropathy; Chronic hip pain (2ry area of Pain) (Bilateral) (R>L); Cervical radiculopathy (Left); Cervicalgia; DDD (degenerative disc disease), cervical; Abnormal x-ray of cervical spine; 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); Cervical facet arthropathy (Right: C4-5, C5-6); Grade 1 Anterolisthesis of cervical spine (1-2 mm) (C4/C5); Cervical foraminal stenosis (Right: C5-C8) (Left: C6-C7); Falls,  sequela; Spondylosis without myelopathy or radiculopathy, lumbosacral region; Cervical radiculopathy (Right); Cervical spondylosis with radiculopathy; Abnormal MRI, cervical spine (11/28/2020); 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 Pain Assessment: Severity of   is reported as a  /10. Location:    / . Onset:  . Quality:  . Timing:  . Modifying factor(s):  Aaron Aas Vitals:  vitals were not taken for this visit.  BMI: Estimated body mass index is 23.73 kg/m as calculated from the following:   Height as of 01/19/24: 6' (1.829 m).   Weight as of 01/19/24: 175 lb (79.4 kg). Last encounter: 09/06/2023. Last procedure: 08/18/2023.  Reason for encounter:  *** . ***  Discussed the use of AI scribe software for clinical note transcription with the patient, who gave verbal consent to proceed.  History of Present Illness           Pharmacotherapy Assessment  Analgesic: Tramadol  50 mg, 2 tabs PO q 6 hrs (400 mg/day of tramadol ). No chronic opioid analgesics therapy prescribed by our practice. MME/day: 40 mg/day.   Monitoring:  PMP: PDMP reviewed during this encounter.       Pharmacotherapy: No side-effects or adverse reactions reported. Compliance: No problems identified. Effectiveness: Clinically acceptable.  No notes on file  No results found for: "CBDTHCR" No results found for: "D8THCCBX" No results found for: "D9THCCBX"  UDS:  Summary  Date Value Ref Range Status  07/20/2023 Note  Final    Comment:    ==================================================================== [employer] [facility] 13 (MW) ==================================================================== Test  Result       Flag       Units  Drug Present and Declared for Prescription Verification   Tramadol                        >21739       EXPECTED   ng/mg creat   O-Desmethyltramadol            18430        EXPECTED   ng/mg creat   N-Desmethyltramadol            2035         EXPECTED   ng/mg creat    Source of tramadol  is a prescription medication. O-desmethyltramadol    and N-desmethyltramadol are expected metabolites of tramadol .  Drug Present not Declared for Prescription Verification   Carboxy-THC                    17           UNEXPECTED ng/mg creat    Carboxy-THC is a metabolite of tetrahydrocannabinol (THC). Source of    THC is most commonly herbal marijuana or marijuana-based products,    but THC is also present in a scheduled prescription medication.    Trace amounts of THC can be present in hemp and cannabidiol (CBD)    products. This test is not intended to distinguish between delta-9-    tetrahydrocannabinol, the predominant form of THC in most herbal or    marijuana-based products, and delta-8-tetrahydrocannabinol.  ==================================================================== Test                      Result    Flag   Units      Ref Range   Creatinine              23               mg/dL      >=82 ==================================================================== Declared Medications:  The flagging and interpretation on this report are based on the  following declared medications.  Unexpected results may arise from  inaccuracies in the declared medications.   **Note: The testing scope of this panel includes these medications:   Tramadol  (Ultram )   **Note: The testing scope of this panel does not include the  following reported medications:   Acetaminophen  (Tylenol )  Amlodipine  (Norvasc )  Chlorthalidone  (Hygroton )  Ibuprofen (Advil)  Iron  Lisinopril  (Zestril )  Naloxone  (Narcan )  Pantoprazole  (Protonix )  Rosuvastatin (Crestor)  Sildenafil (Viagra)   Trazodone (Desyrel) ==================================================================== For clinical consultation, please call [phone]. ====================================================================       ROS  Constitutional: Denies any fever or chills Gastrointestinal: No reported hemesis, hematochezia, vomiting, or acute GI distress Musculoskeletal: Denies any acute onset joint swelling, redness, loss of ROM, or weakness Neurological: No reported episodes of acute onset apraxia, aphasia, dysarthria, agnosia, amnesia, paralysis, loss of coordination, or loss of consciousness  Medication Review  acetaminophen , amLODipine , chlorthalidone , cyclobenzaprine , ferrous sulfate , ibuprofen, lidocaine , lisinopril , naloxone , pantoprazole , rosuvastatin, sildenafil, traMADol , and traZODone  History Review  Allergy: [name] has no known allergies. Drug: [name]  reports no history of drug use. Alcohol:  reports current alcohol use of about 1.0 standard drink of alcohol per week. Tobacco:  reports that he has quit smoking. He has been exposed to tobacco smoke. He has never used smokeless tobacco. Social: [name]  reports that he has quit smoking. He has been exposed to tobacco smoke.  He has never used smokeless tobacco. He reports current alcohol use of about 1.0 standard drink of alcohol per week. He reports that he does not use drugs. Medical:  has a past medical history of Back pain, Blood transfusion without reported diagnosis (12/2015), Diverticulitis, Hypertension, Kidney stone, and Ulcer, stomach peptic, chronic. Surgical: [name]  has a past surgical history that includes Esophagogastroduodenoscopy (egd) with propofol  (N/A, 01/03/2016); Colonoscopy with propofol  (N/A, 08/02/2017); Esophagogastroduodenoscopy (egd) with propofol  (08/02/2017); Esophagogastroduodenoscopy (egd) with propofol  (N/A, 02/05/2018); Colonoscopy with propofol  (N/A, 02/05/2018); Esophagogastroduodenoscopy  (egd) with propofol  (N/A, 03/30/2018); Esophagogastroduodenoscopy (egd) with propofol  (N/A, 08/09/2019); Colonoscopy with propofol  (N/A, 08/09/2019); Givens capsule study (N/A, 04/24/2020); Esophagogastroduodenoscopy (egd) with propofol  (N/A, 04/24/2020); and Maxillary antrostomy (Left, 12/10/2022). Family: family history includes Diabetes in his mother; Heart disease in his father; Prostate cancer in his brother.  Laboratory Chemistry Profile   Renal Lab Results  Component Value Date   BUN 14 01/19/2024   CREATININE 1.25 (H) 01/19/2024   GFRAA >60 02/21/2020   GFRNONAA >60 01/19/2024    Hepatic Lab Results  Component Value Date   AST 23 08/09/2019   ALT 15 08/09/2019   ALBUMIN 3.8 08/09/2019   ALKPHOS 53 08/09/2019   LIPASE 17 08/08/2019    Electrolytes Lab Results  Component Value Date   NA 137 01/19/2024   K 3.9 01/19/2024   CL 103 01/19/2024   CALCIUM  9.5 01/19/2024   MG 1.9 01/04/2016    Bone Lab Results  Component Value Date   VD25OH 24.0 (L) 12/26/2015    Inflammation (CRP: Acute Phase) (ESR: Chronic Phase) Lab Results  Component Value Date   CRP 0.6 12/26/2015   ESRSEDRATE 3 12/26/2015         Note: Above Lab results reviewed.  Recent Imaging Review  CT Lumbar Spine Wo Contrast CLINICAL DATA:  Low back pain  EXAM: CT LUMBAR SPINE WITHOUT CONTRAST  TECHNIQUE: Multidetector CT imaging of the lumbar spine was performed without intravenous contrast administration. Multiplanar CT image reconstructions were also generated.  RADIATION DOSE REDUCTION: This exam was performed according to the departmental dose-optimization program which includes automated exposure control, adjustment of the mA and/or kV according to patient size and/or use of iterative reconstruction technique.  COMPARISON:  MRI lumbar spine 12/14/2022.  FINDINGS: Segmentation: 5 lumbar type vertebrae.  Alignment: There is mild dextroconvex curvature of the lumbar spine. There is 6 mm of  anterolisthesis at L5-S1 which is similar to prior. Alignment is otherwise anatomic.  Vertebrae: There is mild chronic compression deformity of the superior endplate of T12 which is new from the prior MRI, but favored as chronic. No focal osseous lesions are identified. There is disc space narrowing and endplate osteophyte formation at L1-L2, L2-L3, L3-L4 and L5-S1. endplate osteophytes are seen throughout.  Paraspinal and other soft tissues: Bilateral renal calculi are present.  Tubal soft tissues are within normal limits.  Disc levels:  T12-L1: There is mild broad-based disc bulge. There is no central canal or neural foraminal stenosis.  L1-L2: Broad-based disc bulge with bilateral facet arthropathy. Moderate central canal stenosis. No neural foraminal stenosis.  L2-L3: Broad-based disc bulge with bilateral facet arthropathy. Mild central canal stenosis.  L3-L4: Posterior disc osteophyte complex with thickening of the ligamentum flavum and bilateral facet arthropathy. Moderate central canal stenosis. Mild bilateral neural foraminal stenosis.  L4-L5: Broad-based disc bulge with thickening of ligamentum flavum and bilateral facet arthropathy. Moderate central canal stenosis. Moderate bilateral neural foraminal stenosis, right greater than left.  L5-S1: Broad-based  disc bulge with bilateral facet arthropathy. Moderate severe bilateral neural foraminal stenosis. Moderate severe central canal stenosis.  IMPRESSION: 1. Mild chronic compression deformity of the superior endplate of T12, new from the prior MRI, but favored as chronic. 2. Multilevel degenerative changes of the lumbar spine with moderate central canal stenosis at L1-L2, L3-L4 and L4-L5 and moderate severe central canal stenosis at L5-S1. 3. Multilevel neural foraminal stenosis, moderate severe bilaterally at L5-S1. 4. Bilateral renal calculi.  Electronically Signed   By: Tyron Gallon M.D.   On: 01/19/2024  21:06 Note: Reviewed        Physical Exam  General appearance: Well nourished, well developed, and well hydrated. In no apparent acute distress Mental status: Alert, oriented x 3 (person, place, & time)       Respiratory: No evidence of acute respiratory distress Eyes: PERLA Vitals: There were no vitals taken for this visit. BMI: Estimated body mass index is 23.73 kg/m as calculated from the following:   Height as of 01/19/24: 6' (1.829 m).   Weight as of 01/19/24: 175 lb (79.4 kg). Ideal: Ideal body weight: 77.6 kg (171 lb 1.2 oz) Adjusted ideal body weight: 78.3 kg (172 lb 10.3 oz)  Assessment   Diagnosis Status  No diagnosis found. Controlled Controlled Controlled   Updated Problems: No problems updated.  Plan of Care  Problem-specific:  Assessment and Plan            Caleb Edwards has a current medication list which includes the following long-term medication(s): chlorthalidone , lisinopril , pantoprazole , rosuvastatin, tramadol , and trazodone.  Pharmacotherapy (Medications Ordered): No orders of the defined types were placed in this encounter.  Orders:  No orders of the defined types were placed in this encounter.  Follow-up plan:   No follow-ups on file.     Interventional Therapies  Risk Factors  Considerations:   Stage3 CKD  HTN  Hx GI Bleed  Anemia w/ near syncope   Planned  Pending:   Diagnostic/therapeutic L1-2 LESI #1    Under consideration:   Diagnostic/therapeutic L1-2 LESI #1  Diagnostic right IA hip joint injection  Diagnostic bilateral lumbar facet block  Possible bilateral lumbar facet RFA    Completed:   Diagnostic/therapeutic left L4 TFESI x3 + left L5-S1 LESI x1 (01/06/2023) (100/100/100/95) (LLEP:100  LBP:95)  Therapeutic midline C6-7 interspinous ligament inj. x1 (06/28/2022) (80/90/75/75)  Diagnostic bilateral lumbar facet MBB x2 (12/02/2022) (100/100/0/0) did not help his left leg pain. Therapeutic left cervical ESI x2  (02/05/2021) (100/100/70 x 10 days/50)  Therapeutic right cervical ESI x1 (12/15/2021) (90/90/90/90)  Therapeutic left L3-4 LESI x2 (06/13/2018) (100/100/85/85)  Therapeutic left L3 TFESI x1 (03/13/2020) (95/95/60/>50)  Therapeutic left L4 TFESI x2 (03/13/2020) (95/95/60/>50)  Therapeutic right L3 TFESI x2 (03/13/2020) (95/95/60/>50)  Therapeutic right L4 TFESI x3 (03/13/2020) (95/95/60/>50)  Referral to neurosurgery Nance Aw, MD) for cervical instability (11/11/2020)    Therapeutic  Palliative (PRN) options:   Palliative left L3-4 LESI   Palliative left L3 TFESI   Palliative left L4 TFESI   Palliative right L3 TFESI   Palliative right L4 TFESI       Recent Visits No visits were found meeting these conditions. Showing recent visits within past 90 days and meeting all other requirements Future Appointments Date Type Provider Dept  01/30/24 Appointment Renaldo Caroli, MD [facility]  Showing future appointments within next 90 days and meeting all other requirements  I discussed the assessment and treatment plan with the patient. The patient was provided  an opportunity to ask questions and all were answered. The patient agreed with the plan and demonstrated an understanding of the instructions.  Patient advised to call back or seek an in-person evaluation if the symptoms or condition worsens.  Duration of encounter: *** minutes.  Total time on encounter, as per AMA guidelines included both the face-to-face and non-face-to-face time personally spent by the physician and/or other qualified health care professional(s) on the day of the encounter (includes time in activities that require the physician or other qualified health care professional and does not include time in activities normally performed by clinical staff). Physician's time may include the following activities when performed: Preparing to see the patient (e.g., pre-charting review of records, searching for  previously ordered imaging, lab work, and nerve conduction tests) Review of prior analgesic pharmacotherapies. Reviewing PMP Interpreting ordered tests (e.g., lab work, imaging, nerve conduction tests) Performing post-procedure evaluations, including interpretation of diagnostic procedures Obtaining and/or reviewing separately obtained history Performing a medically appropriate examination and/or evaluation Counseling and educating the patient/family/caregiver Ordering medications, tests, or procedures Referring and communicating with other health care professionals (when not separately reported) Documenting clinical information in the electronic or other health record Independently interpreting results (not separately reported) and communicating results to the patient/ family/caregiver Care coordination (not separately reported)  Note by: Candi Chafe, MD (TTS and AI technology used. I apologize for any typographical errors that were not detected and corrected.) Date: 01/30/2024; Time: 1:06 PM

## 2024-01-30 ENCOUNTER — Ambulatory Visit: Payer: Medicare (Managed Care) | Attending: Pain Medicine | Admitting: Pain Medicine

## 2024-01-30 ENCOUNTER — Encounter: Payer: Self-pay | Admitting: Pain Medicine

## 2024-01-30 VITALS — BP 169/85 | HR 65 | Temp 97.9°F | Resp 16 | Ht 72.0 in | Wt 175.0 lb

## 2024-01-30 DIAGNOSIS — M47817 Spondylosis without myelopathy or radiculopathy, lumbosacral region: Secondary | ICD-10-CM

## 2024-01-30 DIAGNOSIS — M545 Low back pain, unspecified: Secondary | ICD-10-CM | POA: Insufficient documentation

## 2024-01-30 DIAGNOSIS — M47816 Spondylosis without myelopathy or radiculopathy, lumbar region: Secondary | ICD-10-CM | POA: Diagnosis not present

## 2024-01-30 DIAGNOSIS — M4317 Spondylolisthesis, lumbosacral region: Secondary | ICD-10-CM | POA: Diagnosis not present

## 2024-01-30 DIAGNOSIS — G8929 Other chronic pain: Secondary | ICD-10-CM | POA: Diagnosis present

## 2024-01-30 DIAGNOSIS — M5459 Other low back pain: Secondary | ICD-10-CM | POA: Diagnosis present

## 2024-01-30 DIAGNOSIS — M4310 Spondylolisthesis, site unspecified: Secondary | ICD-10-CM | POA: Insufficient documentation

## 2024-01-30 NOTE — Patient Instructions (Signed)

## 2024-01-30 NOTE — Progress Notes (Signed)
 Nursing Pain Medication Assessment:  Safety precautions to be maintained throughout the outpatient stay will include: orient to surroundings, keep bed in low position, maintain call bell within reach at all times, provide assistance with transfer out of bed and ambulation.  Medication Inspection Compliance: Caleb Edwards did not comply with our request to bring his pills to be counted. He was reminded that bringing the medication bottles, even when empty, is a requirement.  Medication: None brought in. Pill/Patch Count: None available to be counted. Bottle Appearance: No container available. Did not bring bottle(s) to appointment. Filled Date: N/A Last Medication intake:  Ran out of medicine more than 48 hours ago

## 2024-02-09 ENCOUNTER — Encounter: Payer: Self-pay | Admitting: Pain Medicine

## 2024-02-09 ENCOUNTER — Ambulatory Visit
Admission: RE | Admit: 2024-02-09 | Discharge: 2024-02-09 | Disposition: A | Discharge status: Home / Self Care | Payer: Medicare (Managed Care) | Source: Ambulatory Visit | Attending: Pain Medicine | Admitting: Pain Medicine

## 2024-02-09 ENCOUNTER — Ambulatory Visit: Payer: Medicare (Managed Care) | Attending: Pain Medicine | Admitting: Pain Medicine

## 2024-02-09 VITALS — BP 152/68 | HR 58 | Temp 97.3°F | Resp 18 | Ht 72.0 in | Wt 178.0 lb

## 2024-02-09 DIAGNOSIS — M4310 Spondylolisthesis, site unspecified: Secondary | ICD-10-CM | POA: Diagnosis present

## 2024-02-09 DIAGNOSIS — M47817 Spondylosis without myelopathy or radiculopathy, lumbosacral region: Secondary | ICD-10-CM | POA: Diagnosis not present

## 2024-02-09 DIAGNOSIS — M545 Low back pain, unspecified: Secondary | ICD-10-CM | POA: Diagnosis present

## 2024-02-09 DIAGNOSIS — M5459 Other low back pain: Secondary | ICD-10-CM | POA: Insufficient documentation

## 2024-02-09 DIAGNOSIS — G8929 Other chronic pain: Secondary | ICD-10-CM | POA: Insufficient documentation

## 2024-02-09 DIAGNOSIS — M51379 Other intervertebral disc degeneration, lumbosacral region without mention of lumbar back pain or lower extremity pain: Secondary | ICD-10-CM | POA: Insufficient documentation

## 2024-02-09 DIAGNOSIS — M47816 Spondylosis without myelopathy or radiculopathy, lumbar region: Secondary | ICD-10-CM | POA: Insufficient documentation

## 2024-02-09 MED ORDER — FENTANYL CITRATE (PF) 100 MCG/2ML IJ SOLN
INTRAMUSCULAR | Status: AC
  Filled 2024-02-09: qty 2

## 2024-02-09 MED ORDER — ROPIVACAINE HCL 2 MG/ML IJ SOLN
18.0000 mL | Freq: Once | INTRAMUSCULAR | Status: AC
  Administered 2024-02-09: 18 mL via PERINEURAL

## 2024-02-09 MED ORDER — LIDOCAINE HCL 2 % IJ SOLN
20.0000 mL | Freq: Once | INTRAMUSCULAR | Status: AC
  Administered 2024-02-09: 400 mg

## 2024-02-09 MED ORDER — TRIAMCINOLONE ACETONIDE 40 MG/ML IJ SUSP
80.0000 mg | Freq: Once | INTRAMUSCULAR | Status: AC
  Administered 2024-02-09: 80 mg

## 2024-02-09 MED ORDER — ROPIVACAINE HCL 2 MG/ML IJ SOLN
INTRAMUSCULAR | Status: AC
  Filled 2024-02-09: qty 20

## 2024-02-09 MED ORDER — MIDAZOLAM HCL 5 MG/5ML IJ SOLN
INTRAMUSCULAR | Status: AC
  Filled 2024-02-09: qty 5

## 2024-02-09 MED ORDER — MIDAZOLAM HCL 5 MG/5ML IJ SOLN
0.5000 mg | Freq: Once | INTRAMUSCULAR | Status: AC
  Administered 2024-02-09: 2 mg via INTRAVENOUS

## 2024-02-09 MED ORDER — LIDOCAINE HCL 2 % IJ SOLN
INTRAMUSCULAR | Status: AC
  Filled 2024-02-09: qty 20

## 2024-02-09 MED ORDER — FENTANYL CITRATE (PF) 100 MCG/2ML IJ SOLN
25.0000 ug | INTRAMUSCULAR | Status: AC | PRN
Start: 2024-02-09 — End: 2024-02-09

## 2024-02-09 MED ORDER — PENTAFLUOROPROP-TETRAFLUOROETH EX AERO
INHALATION_SPRAY | Freq: Once | CUTANEOUS | Status: DC
Start: 2024-02-09 — End: 2024-02-12

## 2024-02-09 MED ORDER — TRIAMCINOLONE ACETONIDE 40 MG/ML IJ SUSP
INTRAMUSCULAR | Status: AC
  Filled 2024-02-09: qty 2

## 2024-02-09 NOTE — Patient Instructions (Signed)
 Moderate Conscious Sedation, Adult, Care After After the procedure, it is common to have: Sleepiness for a few hours. Impaired judgment for a few hours. Trouble with balance. Nausea or vomiting if you eat too soon. Follow these instructions at home: For the time period you were told by your health care provider:  Rest. Do not participate in activities where you could fall or become injured. Do not drive or use machinery. Do not drink alcohol. Do not take sleeping pills or medicines that cause drowsiness. Do not make important decisions or sign legal documents. Do not take care of children on your own. Eating and drinking Follow instructions from your health care provider about what you may eat and drink. Drink enough fluid to keep your urine pale yellow. If you vomit: Drink clear fluids slowly and in small amounts as you are able. Clear fluids include water, ice chips, low-calorie sports drinks, and fruit juice that has water added to it (diluted fruit juice). Eat light and bland foods in small amounts as you are able. These foods include bananas, applesauce, rice, lean meats, toast, and crackers. General instructions Take over-the-counter and prescription medicines only as told by your health care provider. Have a responsible adult stay with you for the time you are told. Do not use any products that contain nicotine or tobacco. These products include cigarettes, chewing tobacco, and vaping devices, such as e-cigarettes. If you need help quitting, ask your health care provider. Return to your normal activities as told by your health care provider. Ask your health care provider what activities are safe for you. Your health care provider may give you more instructions. Make sure you know what you can and cannot do. Contact a health care provider if: You are still sleepy or having trouble with balance after 24 hours. You feel light-headed. You vomit every time you eat or drink. You get  a rash. You have a fever. You have redness or swelling around the IV site. Get help right away if: You have trouble breathing. You start to feel confused at home. These symptoms may be an emergency. Get help right away. Call 911. Do not wait to see if the symptoms will go away. Do not drive yourself to the hospital. This information is not intended to replace advice given to you by your health care provider. Make sure you discuss any questions you have with your health care provider. Document Revised: 04/05/2022 Document Reviewed: 04/05/2022 [employer] Patient Education  2024 [employer] ______________________________________________________________________    Post-Procedure Discharge Instructions  Instructions: Apply ice:  Purpose: This will minimize any swelling and discomfort after procedure.  When: Day of procedure, as soon as you get home. How: Fill a plastic sandwich bag with crushed ice. Cover it with a small towel and apply to injection site. How long: (15 min on, 15 min off) Apply for 15 minutes then remove x 15 minutes.  Repeat sequence on day of procedure, until you go to bed. Apply heat:  Purpose: To treat any soreness and discomfort from the procedure. When: Starting the next day after the procedure. How: Apply heat to procedure site starting the day following the procedure. How long: May continue to repeat daily, until discomfort goes away. Food intake: Start with clear liquids (like water) and advance to regular food, as tolerated.  Physical activities: Keep activities to a minimum for the first 8 hours after the procedure. After that, then as tolerated. Driving: If you have received any sedation, be responsible and do  not drive. You are not allowed to drive for 24 hours after having sedation. Blood thinner: (Applies only to those taking blood thinners) You may restart your blood thinner 6 hours after your procedure. Insulin: (Applies only to Diabetic patients taking  insulin) As soon as you can eat, you may resume your normal dosing schedule. Infection prevention: Keep procedure site clean and dry. Shower daily and clean area with soap and water. Post-procedure Pain Diary: Extremely important that this be done correctly and accurately. Recorded information will be used to determine the next step in treatment. For the purpose of accuracy, follow these rules: Evaluate only the area treated. Do not report or include pain from an untreated area. For the purpose of this evaluation, ignore all other areas of pain, except for the treated area. After your procedure, avoid taking a long nap and attempting to complete the pain diary after you wake up. Instead, set your alarm clock to go off every hour, on the hour, for the initial 8 hours after the procedure. Document the duration of the numbing medicine, and the relief you are getting from it. Do not go to sleep and attempt to complete it later. It will not be accurate. If you received sedation, it is likely that you were given a medication that may cause amnesia. Because of this, completing the diary at a later time may cause the information to be inaccurate. This information is needed to plan your care. Follow-up appointment: Keep your post-procedure follow-up evaluation appointment after the procedure (usually 2 weeks for most procedures, 6 weeks for radiofrequencies). DO NOT FORGET to bring you pain diary with you.   Expect: (What should I expect to see with my procedure?) From numbing medicine (AKA: Local Anesthetics): Numbness or decrease in pain. You may also experience some weakness, which if present, could last for the duration of the local anesthetic. Onset: Full effect within 15 minutes of injected. Duration: It will depend on the type of local anesthetic used. On the average, 1 to 8 hours.  From steroids (Applies only if steroids were used): Decrease in swelling or inflammation. Once inflammation is improved,  relief of the pain will follow. Onset of benefits: Depends on the amount of swelling present. The more swelling, the longer it will take for the benefits to be seen. In some cases, up to 10 days. Duration: Steroids will stay in the system x 2 weeks. Duration of benefits will depend on multiple posibilities including persistent irritating factors. Side-effects: If present, they may typically last 2 weeks (the duration of the steroids). Frequent: Cramps (if they occur, drink Gatorade and take over-the-counter Magnesium 450-500 mg once to twice a day); water retention with temporary weight gain; increases in blood sugar; decreased immune system response; increased appetite. Occasional: Facial flushing (red, warm cheeks); mood swings; menstrual changes. Uncommon: Long-term decrease or suppression of natural hormones; bone thinning. (These are more common with higher doses or more frequent use. This is why we prefer that our patients avoid having any injection therapies in other practices.)  Very Rare: Severe mood changes; psychosis; aseptic necrosis. From procedure: Some discomfort is to be expected once the numbing medicine wears off. This should be minimal if ice and heat are applied as instructed.  Call if: (When should I call?) You experience numbness and weakness that gets worse with time, as opposed to wearing off. New onset bowel or bladder incontinence. (Applies only to procedures done in the spine)  Emergency Numbers: Durning business hours (Monday -  Thursday, 8:00 AM - 4:00 PM) (Friday, 9:00 AM - 12:00 Noon): [phone] After hours: [phone] NOTE: If you are having a problem and are unable connect with, or to talk to a provider, then go to your nearest urgent care or emergency department. If the problem is serious and urgent, please call 911. ______________________________________________________________________

## 2024-02-09 NOTE — Progress Notes (Signed)
 Safety precautions to be maintained throughout the outpatient stay will include: orient to surroundings, keep bed in low position, maintain call bell within reach at all times, provide assistance with transfer out of bed and ambulation.

## 2024-02-09 NOTE — Progress Notes (Signed)
 PROVIDER NOTE: Interpretation of information contained herein should be left to medically-trained personnel. Specific patient instructions are provided elsewhere under "Patient Instructions" section of medical record. This document was created in part using STT-dictation technology, any transcriptional errors that may result from this process are unintentional.  Patient: Caleb Edwards Type: Established DOB: [birth year] MRN: [id number] PCP: Conchita Deem, MD  Service: Procedure DOS: 02/09/2024 Setting: Ambulatory Location: Ambulatory outpatient facility Delivery: Face-to-face Provider: Candi Chafe, MD Specialty: Interventional Pain Management Specialty designation: 09 Location: Outpatient facility Ref. Prov.: Renaldo Caroli, MD       Interventional Therapy   Type: Lumbar Facet, Medial Branch Block(s)   #3  Laterality: Bilateral  Level: L2, L3, L4, L5, and S1 Medial Branch Level(s). Injecting these levels blocks the L3-4, L4-5, and L5-S1 lumbar facet joints.  Imaging: Fluoroscopic guidance Spinal ([id number]) Anesthesia: Local anesthesia (1-2% Lidocaine ) Anxiolysis: IV Versed          Sedation: Moderate Sedation                       DOS: 02/09/2024 Performed by: Candi Chafe, MD  Primary Purpose: Diagnostic/Therapeutic Indications: Low back pain severe enough to impact quality of life or function. 1. Chronic low back pain (1ry area of Pain) (Bilateral) (R>L) w/o sciatica   2. Lumbar facet joint pain   3. Lumbar facet syndrome (Bilateral) (R>L)   4. Lumbar facet arthropathy   5. Grade 1 Anterolisthesis (7-46mm) of L5 over S1   6. Spondylosis without myelopathy or radiculopathy, lumbosacral region   7. DDD (degenerative disc disease), lumbosacral, unspecified    NAS-11 Pain score:   Pre-procedure: 10-Worst pain ever/10   Post-procedure: 0-No pain/10     Position / Prep / Materials:  Position: Prone  Prep solution: ChloraPrep (2% chlorhexidine gluconate and 70%  isopropyl alcohol) Area Prepped: Posterolateral Lumbosacral Spine (Wide prep: From the lower border of the scapula down to the end of the tailbone and from flank to flank.)  Materials:  Tray: Block Needle(s):  Type: Spinal  Gauge (G): 22  Length: 3.5-in Qty: 4     H&P (Pre-op Assessment):  Caleb Edwards is a 73 y.o. (year old), male patient, seen today for interventional treatment. He  has a past surgical history that includes Esophagogastroduodenoscopy (egd) with propofol  (N/A, 01/03/2016); Colonoscopy with propofol  (N/A, 08/02/2017); Esophagogastroduodenoscopy (egd) with propofol  (08/02/2017); Esophagogastroduodenoscopy (egd) with propofol  (N/A, 02/05/2018); Colonoscopy with propofol  (N/A, 02/05/2018); Esophagogastroduodenoscopy (egd) with propofol  (N/A, 03/30/2018); Esophagogastroduodenoscopy (egd) with propofol  (N/A, 08/09/2019); Colonoscopy with propofol  (N/A, 08/09/2019); Givens capsule study (N/A, 04/24/2020); Esophagogastroduodenoscopy (egd) with propofol  (N/A, 04/24/2020); and Maxillary antrostomy (Left, 12/10/2022). Caleb Edwards has a current medication list which includes the following prescription(s): acetaminophen , amlodipine , chlorthalidone , cyclobenzaprine , ferrous sulfate , ibuprofen, lidocaine , lisinopril , naloxone , pantoprazole , rosuvastatin, sildenafil, and trazodone, and the following Facility-Administered Medications: pentafluoroprop-tetrafluoroeth. His primarily concern today is the Back Pain  Initial Vital Signs:  Pulse/HCG Rate: (!) 58ECG Heart Rate: 63 Temp: 98.1 F (36.7 C) Resp: 18 BP: (!) 142/79 SpO2: 99 %  BMI: Estimated body mass index is 24.14 kg/m as calculated from the following:   Height as of this encounter: 6' (1.829 m).   Weight as of this encounter: 178 lb (80.7 kg).  Risk Assessment: Allergies: Reviewed. He has no known allergies.  Allergy Precautions: None required Coagulopathies: Reviewed. None identified.  Blood-thinner therapy: None at this time Active  Infection(s): Reviewed. None identified. Caleb Edwards is afebrile  Site Confirmation: Caleb Edwards was asked to confirm the procedure  and laterality before marking the site Procedure checklist: Completed Consent: Before the procedure and under the influence of no sedative(s), amnesic(s), or anxiolytics, the patient was informed of the treatment options, risks and possible complications. To fulfill our ethical and legal obligations, as recommended by the American Medical Association's Code of Ethics, I have informed the patient of my clinical impression; the nature and purpose of the treatment or procedure; the risks, benefits, and possible complications of the intervention; the alternatives, including doing nothing; the risk(s) and benefit(s) of the alternative treatment(s) or procedure(s); and the risk(s) and benefit(s) of doing nothing. The patient was provided information about the general risks and possible complications associated with the procedure. These may include, but are not limited to: failure to achieve desired goals, infection, bleeding, organ or nerve damage, allergic reactions, paralysis, and death. In addition, the patient was informed of those risks and complications associated to Spine-related procedures, such as failure to decrease pain; infection (i.e.: Meningitis, epidural or intraspinal abscess); bleeding (i.e.: epidural hematoma, subarachnoid hemorrhage, or any other type of intraspinal or peri-dural bleeding); organ or nerve damage (i.e.: Any type of peripheral nerve, nerve root, or spinal cord injury) with subsequent damage to sensory, motor, and/or autonomic systems, resulting in permanent pain, numbness, and/or weakness of one or several areas of the body; allergic reactions; (i.e.: anaphylactic reaction); and/or death. Furthermore, the patient was informed of those risks and complications associated with the medications. These include, but are not limited to: allergic reactions (i.e.:  anaphylactic or anaphylactoid reaction(s)); adrenal axis suppression; blood sugar elevation that in diabetics may result in ketoacidosis or comma; water retention that in patients with history of congestive heart failure may result in shortness of breath, pulmonary edema, and decompensation with resultant heart failure; weight gain; swelling or edema; medication-induced neural toxicity; particulate matter embolism and blood vessel occlusion with resultant organ, and/or nervous system infarction; and/or aseptic necrosis of one or more joints. Finally, the patient was informed that Medicine is not an exact science; therefore, there is also the possibility of unforeseen or unpredictable risks and/or possible complications that may result in a catastrophic outcome. The patient indicated having understood very clearly. We have given the patient no guarantees and we have made no promises. Enough time was given to the patient to ask questions, all of which were answered to the patient's satisfaction. Mr. Vadney has indicated that he wanted to continue with the procedure. Attestation: I, the ordering provider, attest that I have discussed with the patient the benefits, risks, side-effects, alternatives, likelihood of achieving goals, and potential problems during recovery for the procedure that I have provided informed consent. Date  Time: 02/09/2024 11:37 AM  Pre-Procedure Preparation:  Monitoring: As per clinic protocol. Respiration, ETCO2, SpO2, BP, heart rate and rhythm monitor placed and checked for adequate function Safety Precautions: Patient was assessed for positional comfort and pressure points before starting the procedure. Time-out: I initiated and conducted the "Time-out" before starting the procedure, as per protocol. The patient was asked to participate by confirming the accuracy of the "Time Out" information. Verification of the correct person, site, and procedure were performed and confirmed by me,  the nursing staff, and the patient. "Time-out" conducted as per Joint Commission's Universal Protocol (UP.01.01.01). Time: 1314 Start Time: 1314 hrs.  Description of Procedure:          Laterality: (see above) Targeted Levels: (see above)  Safety Precautions: Aspiration looking for blood return was conducted prior to all injections. At no point did we  inject any substances, as a needle was being advanced. Before injecting, the patient was told to immediately notify me if he was experiencing any new onset of "ringing in the ears, or metallic taste in the mouth". No attempts were made at seeking any paresthesias. Safe injection practices and needle disposal techniques used. Medications properly checked for expiration dates. SDV (single dose vial) medications used. After the completion of the procedure, all disposable equipment used was discarded in the proper designated medical waste containers. Local Anesthesia: Protocol guidelines were followed. The patient was positioned over the fluoroscopy table. The area was prepped in the usual manner. The time-out was completed. The target area was identified using fluoroscopy. A 12-in long, straight, sterile hemostat was used with fluoroscopic guidance to locate the targets for each level blocked. Once located, the skin was marked with an approved surgical skin marker. Once all sites were marked, the skin (epidermis, dermis, and hypodermis), as well as deeper tissues (fat, connective tissue and muscle) were infiltrated with a small amount of a short-acting local anesthetic, loaded on a 10cc syringe with a 25G, 1.5-in  Needle. An appropriate amount of time was allowed for local anesthetics to take effect before proceeding to the next step. Local Anesthetic: Lidocaine  2.0% The unused portion of the local anesthetic was discarded in the proper designated containers. Technical description of process:  L2 Medial Branch Nerve Block (MBB): The target area for the L2  medial branch is at the junction of the postero-lateral aspect of the superior articular process and the superior, posterior, and medial edge of the transverse process of L3. Under fluoroscopic guidance, a Quincke needle was inserted until contact was made with os over the superior postero-lateral aspect of the pedicular shadow (target area). After negative aspiration for blood, 0.5 mL of the nerve block solution was injected without difficulty or complication. The needle was removed intact. L3 Medial Branch Nerve Block (MBB): The target area for the L3 medial branch is at the junction of the postero-lateral aspect of the superior articular process and the superior, posterior, and medial edge of the transverse process of L4. Under fluoroscopic guidance, a Quincke needle was inserted until contact was made with os over the superior postero-lateral aspect of the pedicular shadow (target area). After negative aspiration for blood, 0.5 mL of the nerve block solution was injected without difficulty or complication. The needle was removed intact. L4 Medial Branch Nerve Block (MBB): The target area for the L4 medial branch is at the junction of the postero-lateral aspect of the superior articular process and the superior, posterior, and medial edge of the transverse process of L5. Under fluoroscopic guidance, a Quincke needle was inserted until contact was made with os over the superior postero-lateral aspect of the pedicular shadow (target area). After negative aspiration for blood, 0.5 mL of the nerve block solution was injected without difficulty or complication. The needle was removed intact. L5 Medial Branch Nerve Block (MBB): The target area for the L5 medial branch is at the junction of the postero-lateral aspect of the superior articular process and the superior, posterior, and medial edge of the sacral ala. Under fluoroscopic guidance, a Quincke needle was inserted until contact was made with os over the  superior postero-lateral aspect of the pedicular shadow (target area). After negative aspiration for blood, 0.5 mL of the nerve block solution was injected without difficulty or complication. The needle was removed intact. S1 Medial Branch Nerve Block (MBB): The target area for the S1 medial branch is  at the posterior and inferior 6 o'clock position of the L5-S1 facet joint. Under fluoroscopic guidance, the Quincke needle inserted for the L5 MBB was redirected until contact was made with os over the inferior and postero aspect of the sacrum, at the 6 o' clock position under the L5-S1 facet joint (Target area). After negative aspiration for blood, 0.5 mL of the nerve block solution was injected without difficulty or complication. The needle was removed intact.  Once the entire procedure was completed, the treated area was cleaned, making sure to leave some of the prepping solution back to take advantage of its long term bactericidal properties.         Illustration of the posterior view of the lumbar spine and the posterior neural structures. Laminae of L2 through S1 are labeled. DPRL5, dorsal primary ramus of L5; DPRS1, dorsal primary ramus of S1; DPR3, dorsal primary ramus of L3; FJ, facet (zygapophyseal) joint L3-L4; I, inferior articular process of L4; LB1, lateral branch of dorsal primary ramus of L1; IAB, inferior articular branches from L3 medial branch (supplies L4-L5 facet joint); IBP, intermediate branch plexus; MB3, medial branch of dorsal primary ramus of L3; NR3, third lumbar nerve root; S, superior articular process of L5; SAB, superior articular branches from L4 (supplies L4-5 facet joint also); TP3, transverse process of L3.   Facet Joint Innervation (* possible contribution)  L1-2 T12, L1 (L2*)  Medial Branch  L2-3 L1, L2 (L3*)         "          "  L3-4 L2, L3 (L4*)         "          "  L4-5 L3, L4 (L5*)         "          "  L5-S1 L4, L5, S1          "          "    Vitals:    02/09/24 1324 02/09/24 1334 02/09/24 1344 02/09/24 1355  BP: 129/77 125/70 (!) 156/95 (!) 152/68  Pulse:      Resp: 16 20 12 18   Temp:  (!) 97.3 F (36.3 C)    TempSrc:  Temporal    SpO2: 100% 98% 98% 99%  Weight:      Height:         End Time: 1323 hrs.  Imaging Guidance (Spinal):          Type of Imaging Technique: Fluoroscopy Guidance (Spinal) Indication(s): Fluoroscopy guidance for needle placement to enhance accuracy in procedures requiring precise needle localization for targeted delivery of medication in or near specific anatomical locations not easily accessible without such real-time imaging assistance. Exposure Time: Please see nurses notes. Contrast: None used. Fluoroscopic Guidance: I was personally present during the use of fluoroscopy. "Tunnel Vision Technique" used to obtain the best possible view of the target area. Parallax error corrected before commencing the procedure. "Direction-depth-direction" technique used to introduce the needle under continuous pulsed fluoroscopy. Once target was reached, antero-posterior, oblique, and lateral fluoroscopic projection used confirm needle placement in all planes. Images permanently stored in EMR. Interpretation: No contrast injected. I personally interpreted the imaging intraoperatively. Adequate needle placement confirmed in multiple planes. Permanent images saved into the patient's record.  Post-operative Assessment:  Post-procedure Vital Signs:  Pulse/HCG Rate: (!) 5882 Temp: (!) 97.3 F (36.3 C) Resp: 18 BP: (!) 152/68 SpO2: 99 %  EBL: None  Complications:  No immediate post-treatment complications observed by team, or reported by patient.  Note: The patient tolerated the entire procedure well. A repeat set of vitals were taken after the procedure and the patient was kept under observation following institutional policy, for this type of procedure. Post-procedural neurological assessment was performed, showing return to  baseline, prior to discharge. The patient was provided with post-procedure discharge instructions, including a section on how to identify potential problems. Should any problems arise concerning this procedure, the patient was given instructions to immediately contact us , at any time, without hesitation. In any case, we plan to contact the patient by telephone for a follow-up status report regarding this interventional procedure.  Comments:  No additional relevant information.  Plan of Care (POC)  Orders:  Orders Placed This Encounter  Procedures   LUMBAR FACET(MEDIAL BRANCH NERVE BLOCK) MBNB    Scheduling Instructions:     Procedure: Lumbar facet block (AKA.: Lumbosacral medial branch nerve block)     Side: Bilateral     Level: L3-4, L4-5, and L5-S1 Facets (L2, L3, L4, L5, and S1 Medial Branch Nerves)     Sedation: Patient's choice.     Timeframe: Today    Where will this procedure be performed?:   [facility]   DG [facility] C-ARM 1-60 MIN NO REPORT    Intraoperative interpretation by procedural physician at [facility].    Standing Status:   Standing    Number of Occurrences:   1    Reason for exam::   Assistance in needle guidance and placement for procedures requiring needle placement in or near specific anatomical locations not easily accessible without such assistance.   Informed Consent Details: Physician/Practitioner Attestation; Transcribe to consent form and obtain patient signature    Nursing Order: Transcribe to consent form and obtain patient signature. Note: Always confirm laterality of pain with Mr. Vernet, before procedure.    Physician/Practitioner attestation of informed consent for procedure/surgical case:   I, the physician/practitioner, attest that I have discussed with the patient the benefits, risks, side effects, alternatives, likelihood of achieving goals and potential problems during recovery for the procedure that I have provided informed  consent.    Procedure:   Lumbar Facet Block  under fluoroscopic guidance    Physician/Practitioner performing the procedure:   Kylee Umana A. Barth Borne MD    Indication/Reason:   Low Back Pain, with our without leg pain, due to Facet Joint Arthralgia (Joint Pain) Spondylosis (Arthritis of the Spine), without myelopathy or radiculopathy (Nerve Damage).   Provide equipment / supplies at bedside    Procedure tray: "Block Tray" (Disposable  single use) Skin infiltration needle: Regular 1.5-in, 25-G, (x1) Block Needle type: Spinal Amount/quantity: 4 Size: Regular (3.5-inch) Gauge: 22G    Standing Status:   Standing    Number of Occurrences:   1    Specify:   Block Tray   Saline lock IV    Have LR [id number] mL available and administer at 125 mL/hr if patient becomes hypotensive.    Standing Status:   Standing    Number of Occurrences:   1   Chronic Opioid Analgesic:   Tramadol  50 mg, 2 tabs PO q 6 hrs (400 mg/day of tramadol ). No chronic opioid analgesics therapy prescribed by our practice. MME/day: 40 mg/day.    Medications ordered for procedure: Meds ordered this encounter  Medications   lidocaine  (XYLOCAINE ) 2 % (with pres) injection 400 mg   pentafluoroprop-tetrafluoroeth (GEBAUERS) aerosol   midazolam  (VERSED ) 5 MG/5ML  injection 0.5-2 mg    Make sure Flumazenil is available in the pyxis when using this medication. If oversedation occurs, administer 0.2 mg IV over 15 sec. If after 45 sec no response, administer 0.2 mg again over 1 min; may repeat at 1 min intervals; not to exceed 4 doses (1 mg)   fentaNYL  (SUBLIMAZE ) injection 25-50 mcg    Make sure Narcan  is available in the pyxis when using this medication. In the event of respiratory depression (RR< 8/min): Titrate NARCAN  (naloxone ) in increments of 0.1 to 0.2 mg IV at 2-3 minute intervals, until desired degree of reversal.   ropivacaine  (PF) 2 mg/mL (0.2%) (NAROPIN ) injection 18 mL   triamcinolone  acetonide (KENALOG -40) injection 80  mg   Medications administered: We administered lidocaine , midazolam , ropivacaine  (PF) 2 mg/mL (0.2%), and triamcinolone  acetonide.  See the medical record for exact dosing, route, and time of administration.  Follow-up plan:   Return in about 2 weeks (around 02/23/2024) for (Face2F), (PPE).       Interventional Therapies  Risk Factors  Considerations:   Stage3 CKD  HTN  Hx GI Bleed  Anemia w/ near syncope   Planned  Pending:   Therapeutic bilateral lumbar facet MBB #3    Under consideration:   Diagnostic/therapeutic L1-2 LESI #1  Diagnostic right IA hip joint injection  Diagnostic bilateral lumbar facet block  Possible bilateral lumbar facet RFA    Completed:   Diagnostic/therapeutic L1-2 LESI x1 (08/18/2023) (80/80/60/60) Diagnostic/therapeutic left L4 TFESI x3 + left L5-S1 LESI x1 (01/06/2023) (100/100/100/95) (LLEP:100  LBP:95)  Therapeutic midline C6-7 interspinous ligament inj. x1 (06/28/2022) (80/90/75/75)  Diagnostic bilateral lumbar facet MBB x2 (12/02/2022) (100/100/0/0) did not help his left leg pain. Therapeutic left cervical ESI x2 (02/05/2021) (100/100/70 x 10 days/50)  Therapeutic right cervical ESI x1 (12/15/2021) (90/90/90/90)  Therapeutic left L3-4 LESI x2 (06/13/2018) (100/100/85/85)  Therapeutic left L3 TFESI x1 (03/13/2020) (95/95/60/>50)  Therapeutic left L4 TFESI x2 (03/13/2020) (95/95/60/>50)  Therapeutic right L3 TFESI x2 (03/13/2020) (95/95/60/>50)  Therapeutic right L4 TFESI x3 (03/13/2020) (95/95/60/>50)  Referral to neurosurgery Nance Aw, MD) for cervical instability (11/11/2020)    Therapeutic  Palliative (PRN) options:   Palliative left L3-4 LESI   Palliative left L3 TFESI   Palliative left L4 TFESI   Palliative right L3 TFESI   Palliative right L4 TFESI        Recent Visits Date Type Provider Dept  02/09/24 Procedure visit Renaldo Caroli, MD [facility]  01/30/24 Office Visit Renaldo Caroli, MD [facility]  Showing recent visits within past 90 days and meeting all other requirements Future Appointments Date Type Provider Dept  02/23/24 Appointment Renaldo Caroli, MD [facility]  Showing future appointments within next 90 days and meeting all other requirements  Disposition: Discharge home  Discharge (Date  Time): 02/09/2024; 1400 hrs.   Primary Care Physician: Conchita Deem, MD Location: [facility] Note by: Candi Chafe, MD (TTS technology used. I apologize for any typographical errors that were not detected and corrected.) Date: 02/09/2024; Time: 4:09 PM  Disclaimer:  Medicine is not an Visual merchandiser. The only guarantee in medicine is that nothing is guaranteed. It is important to note that the decision to proceed with this intervention was based on the information collected from the patient. The Data and conclusions were drawn from the patient's questionnaire, the interview, and the physical examination. Because the information was provided in large part by the patient, it cannot be guaranteed that it  has not been purposely or unconsciously manipulated. Every effort has been made to obtain as much relevant data as possible for this evaluation. It is important to note that the conclusions that lead to this procedure are derived in large part from the available data. Always take into account that the treatment will also be dependent on availability of resources and existing treatment guidelines, considered by other Pain Management Practitioners as being common knowledge and practice, at the time of the intervention. For Medico-Legal purposes, it is also important to point out that variation in procedural techniques and pharmacological choices are the acceptable norm. The indications, contraindications, technique, and results of the above procedure should only be interpreted and judged by a Board-Certified Interventional Pain Specialist with  extensive familiarity and expertise in the same exact procedure and technique.

## 2024-02-10 ENCOUNTER — Telehealth: Payer: Self-pay

## 2024-02-10 NOTE — Telephone Encounter (Signed)
 No issues post-procedure.

## 2024-02-23 ENCOUNTER — Encounter: Payer: Self-pay | Admitting: Pain Medicine

## 2024-02-23 ENCOUNTER — Ambulatory Visit: Payer: Medicare (Managed Care) | Attending: Pain Medicine | Admitting: Pain Medicine

## 2024-02-23 VITALS — BP 137/84 | HR 108 | Temp 98.9°F | Resp 16 | Ht 72.0 in | Wt 178.0 lb

## 2024-02-23 DIAGNOSIS — M47816 Spondylosis without myelopathy or radiculopathy, lumbar region: Secondary | ICD-10-CM

## 2024-02-23 DIAGNOSIS — M5459 Other low back pain: Secondary | ICD-10-CM | POA: Diagnosis present

## 2024-02-23 DIAGNOSIS — Z09 Encounter for follow-up examination after completed treatment for conditions other than malignant neoplasm: Secondary | ICD-10-CM | POA: Diagnosis present

## 2024-02-23 DIAGNOSIS — G8929 Other chronic pain: Secondary | ICD-10-CM | POA: Diagnosis present

## 2024-02-23 DIAGNOSIS — M545 Low back pain, unspecified: Secondary | ICD-10-CM | POA: Insufficient documentation

## 2024-02-23 NOTE — Patient Instructions (Addendum)
 Radiofrequency Ablation Radiofrequency ablation is a procedure that is performed to relieve pain. The procedure is often used for back, neck, or arm pain. Radiofrequency ablation involves the use of a machine that creates radio waves to make heat. During the procedure, the heat is applied to the nerve that carries the pain signal. The heat damages the nerve and interferes with the pain signal. Pain relief usually starts about 2 weeks after the procedure and lasts for 6 months to 1 year. Tell a health care provider about: Any allergies you have. All medicines you are taking, including vitamins, herbs, eye drops, creams, and over-the-counter medicines. Any problems you or family members have had with anesthetic medicines. Any bleeding problems you have. Any surgeries you have had. Any medical conditions you have. Whether you are pregnant or may be pregnant. What are the risks? Generally, this is a safe procedure. However, problems may occur, including: Pain or soreness at the injection site. Allergic reaction to medicines given during the procedure. Bleeding. Infection at the injection site. Damage to nerves or blood vessels. What happens before the procedure? When to stop eating and drinking Follow instructions from your health care provider about what you may eat and drink before your procedure. These may include: 8 hours before the procedure Stop eating most foods. Do not eat meat, fried foods, or fatty foods. Eat only light foods, such as toast or crackers. All liquids are okay except energy drinks and alcohol. 6 hours before the procedure Stop eating. Drink only clear liquids, such as water, clear fruit juice, black coffee, plain tea, and sports drinks. Do not drink energy drinks or alcohol. 2 hours before the procedure Stop drinking all liquids. You may be allowed to take medicine with small sips of water. If you do not follow your health care provider's instructions, your  procedure may be delayed or canceled. Medicines Ask your health care provider about: Changing or stopping your regular medicines. This is especially important if you are taking diabetes medicines or blood thinners. Taking medicines such as aspirin and ibuprofen. These medicines can thin your blood. Do not take these medicines unless your health care provider tells you to take them. Taking over-the-counter medicines, vitamins, herbs, and supplements. General instructions Ask your health care provider what steps will be taken to help prevent infection. These steps may include: Removing hair at the procedure site. Washing skin with a germ-killing soap. Taking antibiotic medicine. If you will be going home right after the procedure, plan to have a responsible adult: Take you home from the hospital or clinic. You will not be allowed to drive. Care for you for the time you are told. What happens during the procedure?  You will be awake during the procedure. You will need to be able to talk with the health care provider during the procedure. An IV will be inserted into one of your veins. You will be given one or more of the following: A medicine to help you relax (sedative). A medicine to numb the area (local anesthetic). Your health care provider will insert a radiofrequency needle into the area to be treated. This is done with the help of fluoroscopy. A wire that carries the radio waves (electrode) will be put through the radiofrequency needle. An electrical pulse will be sent through the electrode to verify the correct nerve that is causing your pain. You will feel a tingling sensation, and you may have muscle twitching. The tissue around the needle tip will be heated by an  electric current that comes from the radiofrequency machine. This will numb the nerves. The needle will be removed. A bandage (dressing) will be put on the insertion area. The procedure may vary among health care providers  and hospitals. What happens after the procedure? Your blood pressure, heart rate, breathing rate, and blood oxygen level will be monitored until you leave the hospital or clinic. Return to your normal activities as told by your health care provider. Ask your health care provider what activities are safe for you. If you were given a sedative during the procedure, it can affect you for several hours. Do not drive or operate machinery until your health care provider says that it is safe. Summary Radiofrequency ablation is a procedure that is performed to relieve pain. The procedure is often used for back, neck, or arm pain. Radiofrequency ablation involves the use of a machine that creates radio waves to make heat. Plan to have a responsible adult take you home from the hospital or clinic. Do not drive or operate machinery until your health care provider says that it is safe. Return to your normal activities as told by your health care provider. Ask your health care provider what activities are safe for you. This information is not intended to replace advice given to you by your health care provider. Make sure you discuss any questions you have with your health care provider. Document Revised: 03/10/2021 Document Reviewed: 03/10/2021 Elsevier Patient Education  2024 [employer] ______________________________________________________________________    Procedure instructions  Stop blood-thinners  Do not eat or drink fluids (other than water) for 6 hours before your procedure  No water for 2 hours before your procedure  Take your blood pressure medicine with a sip of water  Arrive 30 minutes before your appointment  If sedation is planned, bring suitable driver. Teddie Favre, [employer], & public transportation are NOT APPROVED)  Carefully read the "Preparing for your procedure" detailed instructions  If you have questions call us  at [phone]  Procedure appointments are for procedures only.    NO medication refills or new problem evaluations will be done on procedure days.   Only the scheduled, pre-approved procedure and side will be done.   ______________________________________________________________________      ______________________________________________________________________    Preparing for your procedure  Appointments: If you think you may not be able to keep your appointment, call 24-48 hours in advance to cancel. We need time to make it available to others.  Procedure visits are for procedures only. During your procedure appointment there will be: NO Prescription Refills*. NO medication changes or discussions*. NO discussion of disability issues*. NO unrelated pain problem evaluations*. NO evaluations to order other pain procedures*. *These will be addressed at a separate and distinct evaluation encounter on the provider's evaluation schedule and not during procedure days.  Instructions: Food intake: Avoid eating anything solid for at least 8 hours prior to your procedure. Clear liquid intake: You may take clear liquids such as water up to 2 hours prior to your procedure. (No carbonated drinks. No soda.) Transportation: Unless otherwise stated by your physician, bring a driver. (Driver cannot be a Market researcher, Pharmacist, community, or any other form of public transportation.) Morning Medicines: Except for blood thinners, take all of your other morning medications with a sip of water. Make sure to take your heart and blood pressure medicines. If your blood pressure's lower number is above 100, the case will be rescheduled. Blood thinners: Make sure to stop your blood thinners as instructed.  If you take a blood thinner, but were not instructed to stop it, call our office [phone] and ask to talk to a nurse. Not stopping a blood thinner prior to certain procedures could lead to serious complications. Diabetics on insulin: Notify the staff so that you can be scheduled 1st case  in the morning. If your diabetes requires high dose insulin, take only  of your normal insulin dose the morning of the procedure and notify the staff that you have done so. Preventing infections: Shower with an antibacterial soap the morning of your procedure.  Build-up your immune system: Take 1000 mg of Vitamin C with every meal (3 times a day) the day prior to your procedure. Antibiotics: Inform the nursing staff if you are taking any antibiotics or if you have any conditions that may require antibiotics prior to procedures. (Example: recent joint implants)   Pregnancy: If you are pregnant make sure to notify the nursing staff. Not doing so may result in injury to the fetus, including death.  Sickness: If you have a cold, fever, or any active infections, call and cancel or reschedule your procedure. Receiving steroids while having an infection may result in complications. Arrival: You must be in the facility at least 30 minutes prior to your scheduled procedure. Tardiness: Your scheduled time is also the cutoff time. If you do not arrive at least 15 minutes prior to your procedure, you will be rescheduled.  Children: Do not bring any children with you. Make arrangements to keep them home. Dress appropriately: There is always a possibility that your clothing may get soiled. Avoid long dresses. Valuables: Do not bring any jewelry or valuables.  Reasons to call and reschedule or cancel your procedure: (Following these recommendations will minimize the risk of a serious complication.) Surgeries: Avoid having procedures within 2 weeks of any surgery. (Avoid for 2 weeks before or after any surgery). Flu Shots: Avoid having procedures within 2 weeks of a flu shots or . (Avoid for 2 weeks before or after immunizations). Barium: Avoid having a procedure within 7-10 days after having had a radiological study involving the use of radiological contrast. (Myelograms, Barium swallow or enema study). Heart  attacks: Avoid any elective procedures or surgeries for the initial 6 months after a "Myocardial Infarction" (Heart Attack). Blood thinners: It is imperative that you stop these medications before procedures. Let us  know if you if you take any blood thinner.  Infection: Avoid procedures during or within two weeks of an infection (including chest colds or gastrointestinal problems). Symptoms associated with infections include: Localized redness, fever, chills, night sweats or profuse sweating, burning sensation when voiding, cough, congestion, stuffiness, runny nose, sore throat, diarrhea, nausea, vomiting, cold or Flu symptoms, recent or current infections. It is specially important if the infection is over the area that we intend to treat. Heart and lung problems: Symptoms that may suggest an active cardiopulmonary problem include: cough, chest pain, breathing difficulties or shortness of breath, dizziness, ankle swelling, uncontrolled high or unusually low blood pressure, and/or palpitations. If you are experiencing any of these symptoms, cancel your procedure and contact your primary care physician for an evaluation.  Remember:  Regular Business hours are:  Monday to Thursday 8:00 AM to 4:00 PM  Provider's Schedule: Renaldo Caroli, MD:  Procedure days: Tuesday and Thursday 7:30 AM to 4:00 PM  Cephus Collin, MD:  Procedure days: Monday and Wednesday 7:30 AM to 4:00 PM Last  Updated: 09/13/2023 ______________________________________________________________________      ______________________________________________________________________  General Risks and Possible Complications  Patient Responsibilities: It is important that you read this as it is part of your informed consent. It is our duty to inform you of the risks and possible complications associated with treatments offered to you. It is your responsibility as a patient to read this and to ask questions about anything that is not  clear or that you believe was not covered in this document.  Patient's Rights: You have the right to refuse treatment. You also have the right to change your mind, even after initially having agreed to have the treatment done. However, under this last option, if you wait until the last second to change your mind, you may be charged for the materials used up to that point.  Introduction: Medicine is not an Visual merchandiser. Everything in Medicine, including the lack of treatment(s), carries the potential for danger, harm, or loss (which is by definition: Risk). In Medicine, a complication is a secondary problem, condition, or disease that can aggravate an already existing one. All treatments carry the risk of possible complications. The fact that a side effects or complications occurs, does not imply that the treatment was conducted incorrectly. It must be clearly understood that these can happen even when everything is done following the highest safety standards.  No treatment: You can choose not to proceed with the proposed treatment alternative. The "PRO(s)" would include: avoiding the risk of complications associated with the therapy. The "CON(s)" would include: not getting any of the treatment benefits. These benefits fall under one of three categories: diagnostic; therapeutic; and/or palliative. Diagnostic benefits include: getting information which can ultimately lead to improvement of the disease or symptom(s). Therapeutic benefits are those associated with the successful treatment of the disease. Finally, palliative benefits are those related to the decrease of the primary symptoms, without necessarily curing the condition (example: decreasing the pain from a flare-up of a chronic condition, such as incurable terminal cancer).  General Risks and Complications: These are associated to most interventional treatments. They can occur alone, or in combination. They fall under one of the following six (6)  categories: no benefit or worsening of symptoms; bleeding; infection; nerve damage; allergic reactions; and/or death. No benefits or worsening of symptoms: In Medicine there are no guarantees, only probabilities. No healthcare provider can ever guarantee that a medical treatment will work, they can only state the probability that it may. Furthermore, there is always the possibility that the condition may worsen, either directly, or indirectly, as a consequence of the treatment. Bleeding: This is more common if the patient is taking a blood thinner, either prescription or over the counter (example: Goody Powders, Fish oil, Aspirin, Garlic, etc.), or if suffering a condition associated with impaired coagulation (example: Hemophilia, cirrhosis of the liver, low platelet counts, etc.). However, even if you do not have one on these, it can still happen. If you have any of these conditions, or take one of these drugs, make sure to notify your treating physician. Infection: This is more common in patients with a compromised immune system, either due to disease (example: diabetes, cancer, human immunodeficiency virus [HIV], etc.), or due to medications or treatments (example: therapies used to treat cancer and rheumatological diseases). However, even if you do not have one on these, it can still happen. If you have any of these conditions, or take one of these drugs, make sure to notify your treating physician. Nerve Damage: This is more common when the treatment is an invasive one,  but it can also happen with the use of medications, such as those used in the treatment of cancer. The damage can occur to small secondary nerves, or to large primary ones, such as those in the spinal cord and brain. This damage may be temporary or permanent and it may lead to impairments that can range from temporary numbness to permanent paralysis and/or brain death. Allergic Reactions: Any time a substance or material comes in contact  with our body, there is the possibility of an allergic reaction. These can range from a mild skin rash (contact dermatitis) to a severe systemic reaction (anaphylactic reaction), which can result in death. Death: In general, any medical intervention can result in death, most of the time due to an unforeseen complication. ______________________________________________________________________    ______________________________________________________________________    OTC Supplements:   The following is a list of over-the-counter (OTC) supplements that have been found to have [employer]) studies suggesting that they may be of some benefits when used in moderation in some chronic pain-related conditions.  NOTE:  Always consult with your primary care provider and/or pharmacist before taking any OTC medications to make sure they will not interact with your current medications. Always use manufacturer's recommended dosage.  Supplement Possible benefit May be of benefit in treatment of   Turmeric/curcumin anti-inflammatory Joint and muscle aches and pain.  Glucosamine/chondroitin (triple strength) may slow loss of articular cartilage Joint pain.  Vitamin D -3* may suppress release of chemicals associated with inflammation. Increases tolerance to pain. Joint and muscle aches and pain.   Moringa(+) anti-inflammatory with mild analgesic effects Joint and muscle aches and pain.  Melatonin(+) Helps reset sleep cycle. Insomnia.  Vitamin B-12* may help keep nerves and blood cells healthy as well as maintaining function of nervous system Nerve pain (Burning pain)  Alpha-Lipoic-Acid (ALA)* antioxidant that may help with nerve health, pain, and blocking the activation of some inflammatory chemicals Diabetic neuropathy and metabolic syndrome  superoxide dismutase (SOD)** Currently being reviewed.   Tiger Balm Currently being reviewed.   hydrolyzed collagen peptides* Currently being  reviewed.  Collagen supplementation may increases bone strength, density, and mass; may improve joint stiffness/mobility, and functionality; and may reduce joint pain. Possible chondroprotective effects. May help with protection of joint health.   Methylsulfonylmethane (MSM)* Currently being reviewed.   CBD(+) Currently being reviewed.   Delta-8 THC(+) Currently being reviewed.   *  Generally Recognized As Safe (GRAS) approved substance.-FDA ([employer]) ** "Possibly Safe", but not considered Generally Recognized As Safe (Not GRAS) by the [place]  Food and Drug Administration (FDA) as a food additive. (+) Not considered Generally Recognized As Safe (Not GRAS) by the [place]  Food and Drug Administration (FDA) as a food additive.  ______________________________________________________________________

## 2024-02-23 NOTE — Progress Notes (Signed)
 Safety precautions to be maintained throughout the outpatient stay will include: orient to surroundings, keep bed in low position, maintain call bell within reach at all times, provide assistance with transfer out of bed and ambulation.

## 2024-02-23 NOTE — Progress Notes (Signed)
 PROVIDER NOTE: Interpretation of information contained herein should be left to medically-trained personnel. Specific patient instructions are provided elsewhere under "Patient Instructions" section of medical record. This document was created in part using AI and STT-dictation technology, any transcriptional errors that may result from this process are unintentional.  Patient: Caleb Edwards  Service: E/M   PCP: Conchita Deem, MD  DOB: [birth year]  DOS: 02/23/2024  Provider: Candi Chafe, MD  MRN: [id number]  Delivery: Face-to-face  Specialty: Interventional Pain Management  Type: Established Patient  Setting: Ambulatory outpatient facility  Specialty designation: 09  Referring Prov.: Conchita Deem, MD  Location: Outpatient office facility       HPI  [name], a 73 y.o. year old male, is here today because of his Chronic bilateral low back pain without sciatica [M54.50, G89.29]. Caleb Edwards primary complain today is Back Pain  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); Chronic neck pain; Cervical spondylosis; Chronic pain syndrome; Abdominal pain; Grade 1 Anterolisthesis (7-73mm) of L5 over S1; Lumbar facet arthropathy; Chronic hip pain (2ry area of Pain) (Bilateral) (R>L); Cervical radiculopathy (Left); Cervicalgia; DDD (degenerative disc disease), cervical; Abnormal x-ray of cervical spine; 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); Cervical facet arthropathy (Right: C4-5, C5-6); Grade 1 Anterolisthesis of cervical spine (1-2 mm) (C4/C5); Cervical foraminal stenosis (Right: C5-C8) (Left: C6-C7); Falls, sequela; Spondylosis without  myelopathy or radiculopathy, lumbosacral region; Cervical radiculopathy (Right); Cervical spondylosis with radiculopathy; Abnormal MRI, cervical spine (11/28/2020); 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 Pain Assessment: Severity of Chronic pain is reported as a 8 /10. Location: Back Lower, Right, Left/Radaistes from lower back into back of legs down to bottom of feet. Onset: More than a month ago. Quality: Aching, Constant ("Warmth"). Timing: Constant. Modifying factor(s): Procedure and medication (Tylenol ). Vitals:  height is 6' (1.829 m) and weight is 178 lb (80.7 kg). His temporal temperature is 98.9 F (37.2 C). His blood pressure is 137/84 and his pulse is 108 (abnormal). His respiration is 16 and oxygen saturation is 97%.  BMI: Estimated body mass index is 24.14 kg/m as calculated from the following:   Height as of this encounter: 6' (1.829 m).   Weight as of this encounter: 178 lb (80.7 kg). Last encounter: 01/30/2024. Last procedure: 02/09/2024.  Reason for encounter: post-procedure evaluation and assessment.  Discussed the use of AI scribe software for clinical note transcription with the patient, who gave verbal consent to proceed.  History of Present Illness   Caleb Edwards is a 73 year old male who presents with chronic back and leg pain.  He experiences chronic back pain primarily across both sides, with the left side being more affected. The pain radiates down both legs, extending to the bottom of the foot, and is accompanied by numbness in the feet and occasional difficulty walking, described as 'swaying'.  He underwent a diagnostic bilateral lumbar facet block, which  provided temporary relief while the numbing medication was active, resulting in about 60% relief from his usual pain.  During the numbing period, he experienced tingling in the legs but not the usual pain. He experienced muscle spasms and excessive sweating for three to four days following the procedure.  He has not had any back surgeries. His last MRI was approximately a year ago, and he has not noticed significant changes in his pain since then. A recent CT scan showed a 6mm anterolisthesis of L5 over S1.  He has been unable to secure a second opinion from neurosurgeons despite multiple referrals, and he is frustrated with the process.      Post-procedure evaluation  Type: Lumbar Facet, Medial Branch Block(s)   #3  Laterality: Bilateral  Level: L2, L3, L4, L5, and S1 Medial Branch Level(s). Injecting these levels blocks the L3-4, L4-5, and L5-S1 lumbar facet joints.  Imaging: Fluoroscopic guidance Spinal ([id number]) Anesthesia: Local anesthesia (1-2% Lidocaine ) Anxiolysis: IV Versed          Sedation: Moderate Sedation                       DOS: 02/09/2024 Performed by: Candi Chafe, MD  Primary Purpose: Diagnostic/Therapeutic Indications: Low back pain severe enough to impact quality of life or function. 1. Chronic low back pain (1ry area of Pain) (Bilateral) (R>L) w/o sciatica   2. Lumbar facet joint pain   3. Lumbar facet syndrome (Bilateral) (R>L)   4. Lumbar facet arthropathy   5. Grade 1 Anterolisthesis (7-65mm) of L5 over S1   6. Spondylosis without myelopathy or radiculopathy, lumbosacral region   7. DDD (degenerative disc disease), lumbosacral, unspecified    NAS-11 Pain score:   Pre-procedure: 10-Worst pain ever/10   Post-procedure: 0-No pain/10    Effectiveness:  Initial hour after procedure: 100 %. Subsequent 4-6 hours post-procedure: 100 %. Analgesia past initial 6 hours: 40 % (60% relief for 7 days and then pain gradually returned to where now 40%  immproved). Ongoing improvement:  Analgesic: The patient returns to the clinic today indicating having attained 100% relief of the pain for the duration of the local anesthetic followed by a decrease to a 60% improvement that lasted for only 7 days.  After that his pain relief has gone down to only an ongoing 40% improvement. Function: Transient improvement ROM: Transient improvement  Pharmacotherapy Assessment  Analgesic: Tramadol  50 mg, 2 tabs PO q 6 hrs (400 mg/day of tramadol ). No chronic opioid analgesics therapy prescribed by our practice. MME/day: 40 mg/day.   Monitoring: [place] PMP: PDMP reviewed during this encounter.       Pharmacotherapy: No side-effects or adverse reactions reported. Compliance: No problems identified. Effectiveness: Clinically acceptable.  Huston Maiers, RN  02/23/2024  2:29 PM  Sign when Signing Visit Safety precautions to be maintained throughout the outpatient stay will include: orient to surroundings, keep bed in low position, maintain call bell within reach at all times, provide assistance with transfer out of bed and ambulation.     No results found for: "CBDTHCR" No results found for: "D8THCCBX" No results found for: "D9THCCBX"  UDS:  Summary  Date Value Ref Range Status  07/20/2023 Note  Final    Comment:    ==================================================================== [employer] [facility] 13 (MW) ==================================================================== Test                             Result       Flag       Units  Drug Present and Declared for Prescription Verification  Tramadol                        >21739       EXPECTED   ng/mg creat   O-Desmethyltramadol            18430        EXPECTED   ng/mg creat   N-Desmethyltramadol            2035         EXPECTED   ng/mg creat    Source of tramadol  is a prescription medication. O-desmethyltramadol    and N-desmethyltramadol are expected metabolites of tramadol .  Drug Present not  Declared for Prescription Verification   Carboxy-THC                    17           UNEXPECTED ng/mg creat    Carboxy-THC is a metabolite of tetrahydrocannabinol (THC). Source of    THC is most commonly herbal marijuana or marijuana-based products,    but THC is also present in a scheduled prescription medication.    Trace amounts of THC can be present in hemp and cannabidiol (CBD)    products. This test is not intended to distinguish between delta-9-    tetrahydrocannabinol, the predominant form of THC in most herbal or    marijuana-based products, and delta-8-tetrahydrocannabinol.  ==================================================================== Test                      Result    Flag   Units      Ref Range   Creatinine              23               mg/dL      >=40 ==================================================================== Declared Medications:  The flagging and interpretation on this report are based on the  following declared medications.  Unexpected results may arise from  inaccuracies in the declared medications.   **Note: The testing scope of this panel includes these medications:   Tramadol  (Ultram )   **Note: The testing scope of this panel does not include the  following reported medications:   Acetaminophen  (Tylenol )  Amlodipine  (Norvasc )  Chlorthalidone  (Hygroton )  Ibuprofen (Advil)  Iron  Lisinopril  (Zestril )  Naloxone  (Narcan )  Pantoprazole  (Protonix )  Rosuvastatin (Crestor)  Sildenafil (Viagra)  Trazodone (Desyrel) ==================================================================== For clinical consultation, please call [phone]. ====================================================================       ROS  Constitutional: Denies any fever or chills Gastrointestinal: No reported hemesis, hematochezia, vomiting, or acute GI distress Musculoskeletal: Denies any acute onset joint swelling, redness, loss of ROM, or  weakness Neurological: No reported episodes of acute onset apraxia, aphasia, dysarthria, agnosia, amnesia, paralysis, loss of coordination, or loss of consciousness  Medication Review  acetaminophen , amLODipine , chlorthalidone , cyclobenzaprine , ferrous sulfate , ibuprofen, lidocaine , lisinopril , naloxone , pantoprazole , rosuvastatin, sildenafil, and traZODone  History Review  Allergy: Caleb Edwards has no known allergies. Drug: Caleb Edwards  reports no history of drug use. Alcohol:  reports current alcohol use of about 1.0 standard drink of alcohol per week. Tobacco:  reports that he has quit smoking. He has been exposed to tobacco smoke. He has never used smokeless tobacco. Social: Caleb Edwards  reports that he has quit smoking. He has been exposed to tobacco smoke. He has never used smokeless tobacco. He reports current alcohol use of about 1.0 standard drink of alcohol per week. He reports that he does  not use drugs. Medical:  has a past medical history of Back pain, Blood transfusion without reported diagnosis (12/2015), Diverticulitis, Hypertension, Kidney stone, and Ulcer, stomach peptic, chronic. Surgical: Caleb Edwards  has a past surgical history that includes Esophagogastroduodenoscopy (egd) with propofol  (N/A, 01/03/2016); Colonoscopy with propofol  (N/A, 08/02/2017); Esophagogastroduodenoscopy (egd) with propofol  (08/02/2017); Esophagogastroduodenoscopy (egd) with propofol  (N/A, 02/05/2018); Colonoscopy with propofol  (N/A, 02/05/2018); Esophagogastroduodenoscopy (egd) with propofol  (N/A, 03/30/2018); Esophagogastroduodenoscopy (egd) with propofol  (N/A, 08/09/2019); Colonoscopy with propofol  (N/A, 08/09/2019); Givens capsule study (N/A, 04/24/2020); Esophagogastroduodenoscopy (egd) with propofol  (N/A, 04/24/2020); and Maxillary antrostomy (Left, 12/10/2022). Family: family history includes Diabetes in his mother; Heart disease in his father; Prostate cancer in his brother.  Laboratory Chemistry Profile    Renal Lab Results  Component Value Date   BUN 14 01/19/2024   CREATININE 1.25 (H) 01/19/2024   GFRAA >60 02/21/2020   GFRNONAA >60 01/19/2024    Hepatic Lab Results  Component Value Date   AST 23 08/09/2019   ALT 15 08/09/2019   ALBUMIN 3.8 08/09/2019   ALKPHOS 53 08/09/2019   LIPASE 17 08/08/2019    Electrolytes Lab Results  Component Value Date   NA 137 01/19/2024   K 3.9 01/19/2024   CL 103 01/19/2024   CALCIUM  9.5 01/19/2024   MG 1.9 01/04/2016    Bone Lab Results  Component Value Date   VD25OH 24.0 (L) 12/26/2015    Inflammation (CRP: Acute Phase) (ESR: Chronic Phase) Lab Results  Component Value Date   CRP 0.6 12/26/2015   ESRSEDRATE 3 12/26/2015         Note: Above Lab results reviewed.  Recent Imaging Review  DG PAIN CLINIC C-ARM 1-60 MIN NO REPORT Fluoro was used, but no Radiologist interpretation will be provided.  Please refer to "NOTES" tab for provider progress note. Note: Reviewed         Physical Exam  General appearance: Well nourished, well developed, and well hydrated. In no apparent acute distress Mental status: Alert, oriented x 3 (person, place, & time)       Respiratory: No evidence of acute respiratory distress Eyes: PERLA Vitals: BP 137/84 (Patient Position: Sitting)   Pulse (!) 108   Temp 98.9 F (37.2 C) (Temporal)   Resp 16   Ht 6' (1.829 m)   Wt 178 lb (80.7 kg)   SpO2 97%   BMI 24.14 kg/m  BMI: Estimated body mass index is 24.14 kg/m as calculated from the following:   Height as of this encounter: 6' (1.829 m).   Weight as of this encounter: 178 lb (80.7 kg). Ideal: Ideal body weight: 77.6 kg (171 lb 1.2 oz) Adjusted ideal body weight: 78.9 kg (173 lb 13.5 oz)  Assessment   Diagnosis Status  1. Chronic low back pain (1ry area of Pain) (Bilateral) (R>L) w/o sciatica   2. Lumbar facet joint pain   3. Lumbar facet syndrome (Bilateral) (R>L)   4. Postop check    Controlled Controlled Controlled   Updated  Problems: No problems updated.  Plan of Care  Problem-specific:  Assessment and Plan    Low back pain with bilateral leg pain   Chronic low back pain with bilateral leg pain, more severe on the left and extending to the feet, suggests S1 nerve irritation. A previous diagnostic bilateral lumbar facet block provided temporary relief, indicating facet joint involvement. Pain pattern and numbness suggest possible spinal canal or neuroforaminal narrowing affecting the S1 nerve. A recent CT shows 6mm anterolisthesis of L5 over S1, likely  contributing to symptoms. Radiofrequency ablation is proposed for longer-lasting relief by ablating nerves to the facet joints, potentially offering relief for 3 to 18 months. This procedure is less invasive and repeatable as needed. Perform radiofrequency ablation on the left side first, followed by the right side two weeks later. Consider epidural steroid injection if leg pain persists. Recommend over-the-counter anti-inflammatory options such as turmeric and glucosamine chondroitin.  Spondylolisthesis, lumbar region   6mm anterolisthesis of L5 over S1 on recent CT likely contributes to low back and leg pain by causing spinal stenosis and narrowing of neural foramina, potentially compressing nerves.  Electrolyte imbalance due to steroid use   Muscle spasms and excessive sweating are likely due to steroid-induced electrolyte imbalance. Steroids can cause electrolyte loss, leading to spasms if dietary intake is insufficient. Drinking Gatorade is recommended to replenish electrolytes and alleviate symptoms. Advise drinking Gatorade to replenish electrolytes and alleviate muscle spasms.       Caleb Edwards has a current medication list which includes the following long-term medication(s): chlorthalidone , lisinopril , pantoprazole , rosuvastatin, and trazodone.  Pharmacotherapy (Medications Ordered): No orders of the defined types were placed in this  encounter.  Orders:  Orders Placed This Encounter  Procedures   Radiofrequency,Lumbar    Standing Status:   Future    Expiration Date:   05/25/2024    Scheduling Instructions:     Side(s): Left-sided     Level: L3-4, L4-5, and L5-S1 Facets (L2, L3, L4, L5, and S1 Medial Branch)     Sedation: With Sedation.     Timeframe: As soon as schedule allows.    Where will this procedure be performed?:   [facility]   Nursing Instructions:    Please complete this patient's postprocedure evaluation.    Scheduling Instructions:     Please complete this patient's postprocedure evaluation.   Follow-up plan:   Return for ( ), (ECT): (L) L-FCT RFA #1.     Interventional Therapies  Risk Factors  Considerations:   Stage3 CKD  HTN  Hx GI Bleed  Anemia w/ near syncope   Planned  Pending:   Therapeutic bilateral lumbar facet RFA #1 (starting with the left side)    Under consideration:   Diagnostic/therapeutic L1-2 LESI #1  Diagnostic right IA hip joint injection  Diagnostic bilateral lumbar facet block  Possible bilateral lumbar facet RFA    Completed:   Diagnostic/therapeutic L1-2 LESI x1 (08/18/2023) (80/80/60/60) Diagnostic/therapeutic left L4 TFESI x3 + left L5-S1 LESI x1 (01/06/2023) (100/100/100/95) (LLEP:100  LBP:95)  Therapeutic midline C6-7 interspinous ligament inj. x1 (06/28/2022) (80/90/75/75)  Diagnostic bilateral lumbar facet MBB x3 (02/09/2024) (100/100/60/40)  Therapeutic left cervical ESI x2 (02/05/2021) (100/100/70 x 10 days/50)  Therapeutic right cervical ESI x1 (12/15/2021) (90/90/90/90)  Therapeutic left L3-4 LESI x2 (06/13/2018) (100/100/85/85)  Therapeutic left L3 TFESI x1 (03/13/2020) (95/95/60/>50)  Therapeutic left L4 TFESI x2 (03/13/2020) (95/95/60/>50)  Therapeutic right L3 TFESI x2 (03/13/2020) (95/95/60/>50)  Therapeutic right L4 TFESI x3 (03/13/2020) (95/95/60/>50)  Referral to neurosurgery Nance Aw, MD) for cervical instability (11/11/2020)     Therapeutic  Palliative (PRN) options:   Palliative left L3-4 LESI   Palliative left L3 TFESI   Palliative left L4 TFESI   Palliative right L3 TFESI   Palliative right L4 TFESI       Recent Visits Date Type Provider Dept  02/09/24 Procedure visit Renaldo Caroli, MD [facility]  01/30/24 Office Visit Renaldo Caroli, MD [facility]  Showing recent visits within past 90 days and  meeting all other requirements Today's Visits Date Type Provider Dept  02/23/24 Office Visit Renaldo Caroli, MD [facility]  Showing today's visits and meeting all other requirements Future Appointments No visits were found meeting these conditions. Showing future appointments within next 90 days and meeting all other requirements   I discussed the assessment and treatment plan with the patient. The patient was provided an opportunity to ask questions and all were answered. The patient agreed with the plan and demonstrated an understanding of the instructions.  Patient advised to call back or seek an in-person evaluation if the symptoms or condition worsens.  Duration of encounter: 30 minutes.  Total time on encounter, as per AMA guidelines included both the face-to-face and non-face-to-face time personally spent by the physician and/or other qualified health care professional(s) on the day of the encounter (includes time in activities that require the physician or other qualified health care professional and does not include time in activities normally performed by clinical staff). Physician's time may include the following activities when performed: Preparing to see the patient (e.g., pre-charting review of records, searching for previously ordered imaging, lab work, and nerve conduction tests) Review of prior analgesic pharmacotherapies. Reviewing PMP Interpreting ordered tests (e.g., lab work, imaging, nerve conduction tests) Performing post-procedure  evaluations, including interpretation of diagnostic procedures Obtaining and/or reviewing separately obtained history Performing a medically appropriate examination and/or evaluation Counseling and educating the patient/family/caregiver Ordering medications, tests, or procedures Referring and communicating with other health care professionals (when not separately reported) Documenting clinical information in the electronic or other health record Independently interpreting results (not separately reported) and communicating results to the patient/ family/caregiver Care coordination (not separately reported)  Note by: Candi Chafe, MD (TTS and AI technology used. I apologize for any typographical errors that were not detected and corrected.) Date: 02/23/2024; Time: 3:45 PM

## 2024-03-13 ENCOUNTER — Ambulatory Visit
Admission: RE | Admit: 2024-03-13 | Discharge: 2024-03-13 | Disposition: A | Discharge status: Home / Self Care | Payer: Medicare (Managed Care) | Source: Ambulatory Visit | Attending: Pain Medicine | Admitting: Pain Medicine

## 2024-03-13 ENCOUNTER — Encounter: Payer: Self-pay | Admitting: Pain Medicine

## 2024-03-13 ENCOUNTER — Ambulatory Visit: Payer: Medicare (Managed Care) | Attending: Pain Medicine | Admitting: Pain Medicine

## 2024-03-13 ENCOUNTER — Other Ambulatory Visit: Payer: Self-pay | Admitting: Physician Assistant

## 2024-03-13 VITALS — BP 136/86 | HR 55 | Temp 97.6°F | Resp 16 | Ht 72.0 in | Wt 180.0 lb

## 2024-03-13 DIAGNOSIS — M5459 Other low back pain: Secondary | ICD-10-CM | POA: Insufficient documentation

## 2024-03-13 DIAGNOSIS — M47817 Spondylosis without myelopathy or radiculopathy, lumbosacral region: Secondary | ICD-10-CM | POA: Insufficient documentation

## 2024-03-13 DIAGNOSIS — M4310 Spondylolisthesis, site unspecified: Secondary | ICD-10-CM | POA: Insufficient documentation

## 2024-03-13 DIAGNOSIS — G8929 Other chronic pain: Secondary | ICD-10-CM | POA: Diagnosis present

## 2024-03-13 DIAGNOSIS — R937 Abnormal findings on diagnostic imaging of other parts of musculoskeletal system: Secondary | ICD-10-CM | POA: Diagnosis present

## 2024-03-13 DIAGNOSIS — Z0000 Encounter for general adult medical examination without abnormal findings: Secondary | ICD-10-CM

## 2024-03-13 DIAGNOSIS — M51379 Other intervertebral disc degeneration, lumbosacral region without mention of lumbar back pain or lower extremity pain: Secondary | ICD-10-CM | POA: Diagnosis present

## 2024-03-13 DIAGNOSIS — Z87891 Personal history of nicotine dependence: Secondary | ICD-10-CM

## 2024-03-13 DIAGNOSIS — M545 Low back pain, unspecified: Secondary | ICD-10-CM | POA: Diagnosis present

## 2024-03-13 DIAGNOSIS — G8918 Other acute postprocedural pain: Secondary | ICD-10-CM | POA: Insufficient documentation

## 2024-03-13 DIAGNOSIS — M79605 Pain in left leg: Secondary | ICD-10-CM | POA: Insufficient documentation

## 2024-03-13 DIAGNOSIS — M47816 Spondylosis without myelopathy or radiculopathy, lumbar region: Secondary | ICD-10-CM | POA: Diagnosis not present

## 2024-03-13 MED ORDER — PENTAFLUOROPROP-TETRAFLUOROETH EX AERO
INHALATION_SPRAY | Freq: Once | CUTANEOUS | Status: AC
  Administered 2024-03-13: 30 via TOPICAL

## 2024-03-13 MED ORDER — MIDAZOLAM HCL 5 MG/5ML IJ SOLN
0.5000 mg | Freq: Once | INTRAMUSCULAR | Status: AC
  Administered 2024-03-13: 1 mg via INTRAVENOUS

## 2024-03-13 MED ORDER — TRIAMCINOLONE ACETONIDE 40 MG/ML IJ SUSP
INTRAMUSCULAR | Status: AC
  Filled 2024-03-13: qty 1

## 2024-03-13 MED ORDER — HYDROCODONE-ACETAMINOPHEN 5-325 MG PO TABS: 1.0000 | ORAL_TABLET | Freq: Four times a day (QID) | ORAL | 0 refills | Status: AC | PRN

## 2024-03-13 MED ORDER — LIDOCAINE HCL 2 % IJ SOLN
INTRAMUSCULAR | Status: AC
  Filled 2024-03-13: qty 20

## 2024-03-13 MED ORDER — LIDOCAINE HCL 2 % IJ SOLN
20.0000 mL | Freq: Once | INTRAMUSCULAR | Status: AC
  Administered 2024-03-13: 400 mg

## 2024-03-13 MED ORDER — FENTANYL CITRATE (PF) 100 MCG/2ML IJ SOLN
INTRAMUSCULAR | Status: AC
  Filled 2024-03-13: qty 2

## 2024-03-13 MED ORDER — ROPIVACAINE HCL 2 MG/ML IJ SOLN
9.0000 mL | Freq: Once | INTRAMUSCULAR | Status: AC
  Administered 2024-03-13: 9 mL via PERINEURAL

## 2024-03-13 MED ORDER — MIDAZOLAM HCL 5 MG/5ML IJ SOLN
INTRAMUSCULAR | Status: AC
  Filled 2024-03-13: qty 5

## 2024-03-13 MED ORDER — ROPIVACAINE HCL 2 MG/ML IJ SOLN
INTRAMUSCULAR | Status: AC
  Filled 2024-03-13: qty 20

## 2024-03-13 MED ORDER — FENTANYL CITRATE (PF) 100 MCG/2ML IJ SOLN
25.0000 ug | INTRAMUSCULAR | Status: DC | PRN
  Administered 2024-03-13: 50 ug via INTRAVENOUS

## 2024-03-13 MED ORDER — TRIAMCINOLONE ACETONIDE 40 MG/ML IJ SUSP
40.0000 mg | Freq: Once | INTRAMUSCULAR | Status: AC
  Administered 2024-03-13: 40 mg

## 2024-03-13 NOTE — Progress Notes (Signed)
 PROVIDER NOTE: Interpretation of information contained herein should be left to medically-trained personnel. Specific patient instructions are provided elsewhere under "Patient Instructions" section of medical record. This document was created in part using STT-dictation technology, any transcriptional errors that may result from this process are unintentional.  Patient: Caleb Edwards Type: Established DOB: [birth year] MRN: [id number] PCP: Conchita Deem, MD  Service: Procedure DOS: 03/13/2024 Setting: Ambulatory Location: Ambulatory outpatient facility Delivery: Face-to-face Provider: Candi Chafe, MD Specialty: Interventional Pain Management Specialty designation: 09 Location: Outpatient facility Ref. Prov.: Conchita Deem, MD       Interventional Therapy   Procedure: Lumbar Facet, Medial Branch Radiofrequency Ablation (RFA) #1  Laterality: Left (-LT)  Level: L2, L3, L4, L5, and S1 Medial Branch Level(s). These levels will denervate the L3-4, L4-5, and L5-S1 lumbar facet joints.  Imaging: Fluoroscopy-guided Spinal ([id number]) Anesthesia: Local anesthesia (1-2% Lidocaine ) Anxiolysis: IV Versed  1.0 mg Sedation: Moderate Sedation Fentanyl  1 mL (50 mcg) DOS: 03/13/2024  Performed by: Candi Chafe, MD  Purpose: Therapeutic/Palliative Indications: Low back pain severe enough to impact quality of life or function. Indications: 1. Chronic low back pain (1ry area of Pain) (Bilateral) (R>L) w/o sciatica   2. Grade 1 Anterolisthesis (7-56mm) of L5 over S1   3. Lumbar facet joint pain   4. Lumbar facet arthropathy   5. Lumbar facet syndrome (Bilateral) (R>L)   6. Spondylosis without myelopathy or radiculopathy, lumbosacral region   7. Lumbar spondylosis   8. Low back pain radiating to left leg   9. Multifactorial low back pain   10. Mechanical low back pain   11. Degeneration of intervertebral disc of lumbosacral region without discogenic pain or lower extremity pain   12.  Abnormal MRI, lumbar spine (12/16/2022)    Mr. Haughey has been dealing with the above chronic pain for longer than three months and has either failed to respond, was unable to tolerate, or simply did not get enough benefit from other more conservative therapies including, but not limited to: 1. Over-the-counter medications 2. Anti-inflammatory medications 3. Muscle relaxants 4. Membrane stabilizers 5. Opioids 6. Physical therapy and/or chiropractic manipulation 7. Modalities (Heat, ice, etc.) 8. Invasive techniques such as nerve blocks. Mr. Craine has attained more than 50% relief of the pain from a series of diagnostic injections conducted in separate occasions.  Pain Score: Pre-procedure: 10-Worst pain ever/10 Post-procedure: 0-No pain/10     Position / Prep / Materials:  Position: Prone  Prep solution: ChloraPrep (2% chlorhexidine gluconate and 70% isopropyl alcohol) Prep Area: Entire Lumbosacral Region (Lower back from mid-thoracic region to end of tailbone and from flank to flank.) Materials:  Tray: RFA (Radiofrequency) tray Needle(s):  Type: RFA (Teflon-coated radiofrequency ablation needles) Gauge (G): 22  Length: Regular (10cm) Qty: 5     H&P (Pre-op Assessment):  Mr. Drone is a 73 y.o. (year old), male patient, seen today for interventional treatment. He  has a past surgical history that includes Esophagogastroduodenoscopy (egd) with propofol  (N/A, 01/03/2016); Colonoscopy with propofol  (N/A, 08/02/2017); Esophagogastroduodenoscopy (egd) with propofol  (08/02/2017); Esophagogastroduodenoscopy (egd) with propofol  (N/A, 02/05/2018); Colonoscopy with propofol  (N/A, 02/05/2018); Esophagogastroduodenoscopy (egd) with propofol  (N/A, 03/30/2018); Esophagogastroduodenoscopy (egd) with propofol  (N/A, 08/09/2019); Colonoscopy with propofol  (N/A, 08/09/2019); Givens capsule study (N/A, 04/24/2020); Esophagogastroduodenoscopy (egd) with propofol  (N/A, 04/24/2020); and Maxillary antrostomy (Left,  12/10/2022). Mr. Arney has a current medication list which includes the following prescription(s): acetaminophen , amlodipine , chlorthalidone , cyclobenzaprine , ferrous sulfate , hydrocodone -acetaminophen , [START ON 03/20/2024] hydrocodone -acetaminophen , ibuprofen, lidocaine , lisinopril , naloxone , pantoprazole , rosuvastatin, sildenafil, and trazodone, and the following  Facility-Administered Medications: fentanyl . His primarily concern today is the Back Pain (Left, lower)  Initial Vital Signs:  Pulse/HCG Rate: (!) 55ECG Heart Rate: 68 Temp: 98.2 F (36.8 C) Resp: 16 BP: (!) 146/86 SpO2: 98 %  BMI: Estimated body mass index is 24.41 kg/m as calculated from the following:   Height as of this encounter: 6' (1.829 m).   Weight as of this encounter: 180 lb (81.6 kg).  Risk Assessment: Allergies: Reviewed. He has no known allergies.  Allergy Precautions: None required Coagulopathies: Reviewed. None identified.  Blood-thinner therapy: None at this time Active Infection(s): Reviewed. None identified. Mr. Henton is afebrile  Site Confirmation: Mr. Strahan was asked to confirm the procedure and laterality before marking the site Procedure checklist: Completed Consent: Before the procedure and under the influence of no sedative(s), amnesic(s), or anxiolytics, the patient was informed of the treatment options, risks and possible complications. To fulfill our ethical and legal obligations, as recommended by the American Medical Association's Code of Ethics, I have informed the patient of my clinical impression; the nature and purpose of the treatment or procedure; the risks, benefits, and possible complications of the intervention; the alternatives, including doing nothing; the risk(s) and benefit(s) of the alternative treatment(s) or procedure(s); and the risk(s) and benefit(s) of doing nothing. The patient was provided information about the general risks and possible complications associated with the  procedure. These may include, but are not limited to: failure to achieve desired goals, infection, bleeding, organ or nerve damage, allergic reactions, paralysis, and death. In addition, the patient was informed of those risks and complications associated to Spine-related procedures, such as failure to decrease pain; infection (i.e.: Meningitis, epidural or intraspinal abscess); bleeding (i.e.: epidural hematoma, subarachnoid hemorrhage, or any other type of intraspinal or peri-dural bleeding); organ or nerve damage (i.e.: Any type of peripheral nerve, nerve root, or spinal cord injury) with subsequent damage to sensory, motor, and/or autonomic systems, resulting in permanent pain, numbness, and/or weakness of one or several areas of the body; allergic reactions; (i.e.: anaphylactic reaction); and/or death. Furthermore, the patient was informed of those risks and complications associated with the medications. These include, but are not limited to: allergic reactions (i.e.: anaphylactic or anaphylactoid reaction(s)); adrenal axis suppression; blood sugar elevation that in diabetics may result in ketoacidosis or comma; water retention that in patients with history of congestive heart failure may result in shortness of breath, pulmonary edema, and decompensation with resultant heart failure; weight gain; swelling or edema; medication-induced neural toxicity; particulate matter embolism and blood vessel occlusion with resultant organ, and/or nervous system infarction; and/or aseptic necrosis of one or more joints. Finally, the patient was informed that Medicine is not an exact science; therefore, there is also the possibility of unforeseen or unpredictable risks and/or possible complications that may result in a catastrophic outcome. The patient indicated having understood very clearly. We have given the patient no guarantees and we have made no promises. Enough time was given to the patient to ask questions, all of  which were answered to the patient's satisfaction. Mr. Dahm has indicated that he wanted to continue with the procedure. Attestation: I, the ordering provider, attest that I have discussed with the patient the benefits, risks, side-effects, alternatives, likelihood of achieving goals, and potential problems during recovery for the procedure that I have provided informed consent. Date  Time: 03/13/2024 10:30 AM  Pre-Procedure Preparation:  Monitoring: As per clinic protocol. Respiration, ETCO2, SpO2, BP, heart rate and rhythm monitor placed and checked for adequate  function Safety Precautions: Patient was assessed for positional comfort and pressure points before starting the procedure. Time-out: I initiated and conducted the "Time-out" before starting the procedure, as per protocol. The patient was asked to participate by confirming the accuracy of the "Time Out" information. Verification of the correct person, site, and procedure were performed and confirmed by me, the nursing staff, and the patient. "Time-out" conducted as per Joint Commission's Universal Protocol (UP.01.01.01). Time: 1206 Start Time: 1206 hrs.  Description of Procedure:          Laterality: See above. Levels:  See above. Safety Precautions: Aspiration looking for blood return was conducted prior to all injections. At no point did we inject any substances, as a needle was being advanced. Before injecting, the patient was told to immediately notify me if he was experiencing any new onset of "ringing in the ears, or metallic taste in the mouth". No attempts were made at seeking any paresthesias. Safe injection practices and needle disposal techniques used. Medications properly checked for expiration dates. SDV (single dose vial) medications used. After the completion of the procedure, all disposable equipment used was discarded in the proper designated medical waste containers. Local Anesthesia: Protocol guidelines were followed.  The patient was positioned over the fluoroscopy table. The area was prepped in the usual manner. The time-out was completed. The target area was identified using fluoroscopy. A 12-in long, straight, sterile hemostat was used with fluoroscopic guidance to locate the targets for each level blocked. Once located, the skin was marked with an approved surgical skin marker. Once all sites were marked, the skin (epidermis, dermis, and hypodermis), as well as deeper tissues (fat, connective tissue and muscle) were infiltrated with a small amount of a short-acting local anesthetic, loaded on a 10cc syringe with a 25G, 1.5-in  Needle. An appropriate amount of time was allowed for local anesthetics to take effect before proceeding to the next step. Technical description of process:  Radiofrequency Ablation (RFA) L2 Medial Branch Nerve RFA: The target area for the L2 medial branch is at the junction of the postero-lateral aspect of the superior articular process and the superior, posterior, and medial edge of the transverse process of L3. Under fluoroscopic guidance, a Radiofrequency needle was inserted until contact was made with os over the superior postero-lateral aspect of the pedicular shadow (target area). Sensory and motor testing was conducted to properly adjust the position of the needle. Once satisfactory placement of the needle was achieved, the numbing solution was slowly injected after negative aspiration for blood. 2.0 mL of the nerve block solution was injected without difficulty or complication. After waiting for at least 3 minutes, the ablation was performed. Once completed, the needle was removed intact. L3 Medial Branch Nerve RFA: The target area for the L3 medial branch is at the junction of the postero-lateral aspect of the superior articular process and the superior, posterior, and medial edge of the transverse process of L4. Under fluoroscopic guidance, a Radiofrequency needle was inserted until  contact was made with os over the superior postero-lateral aspect of the pedicular shadow (target area). Sensory and motor testing was conducted to properly adjust the position of the needle. Once satisfactory placement of the needle was achieved, the numbing solution was slowly injected after negative aspiration for blood. 2.0 mL of the nerve block solution was injected without difficulty or complication. After waiting for at least 3 minutes, the ablation was performed. Once completed, the needle was removed intact. L4 Medial Branch Nerve RFA: The  target area for the L4 medial branch is at the junction of the postero-lateral aspect of the superior articular process and the superior, posterior, and medial edge of the transverse process of L5. Under fluoroscopic guidance, a Radiofrequency needle was inserted until contact was made with os over the superior postero-lateral aspect of the pedicular shadow (target area). Sensory and motor testing was conducted to properly adjust the position of the needle. Once satisfactory placement of the needle was achieved, the numbing solution was slowly injected after negative aspiration for blood. 2.0 mL of the nerve block solution was injected without difficulty or complication. After waiting for at least 3 minutes, the ablation was performed. Once completed, the needle was removed intact. L5 Medial Branch Nerve RFA: The target area for the L5 medial branch is at the junction of the postero-lateral aspect of the superior articular process of S1 and the superior, posterior, and medial edge of the sacral ala. Under fluoroscopic guidance, a Radiofrequency needle was inserted until contact was made with os over the superior postero-lateral aspect of the pedicular shadow (target area). Sensory and motor testing was conducted to properly adjust the position of the needle. Once satisfactory placement of the needle was achieved, the numbing solution was slowly injected after negative  aspiration for blood. 2.0 mL of the nerve block solution was injected without difficulty or complication. After waiting for at least 3 minutes, the ablation was performed. Once completed, the needle was removed intact. S1 Medial Branch Nerve RFA: The target area for the S1 medial branch is located inferior to the junction of the S1 superior articular process and the L5 inferior articular process, posterior, inferior, and lateral to the 6 o'clock position of the L5-S1 facet joint, just superior to the S1 posterior foramen. Under fluoroscopic guidance, the Radiofrequency needle was advanced until contact was made with os over the Target area. Sensory and motor testing was conducted to properly adjust the position of the needle. Once satisfactory placement of the needle was achieved, the numbing solution was slowly injected after negative aspiration for blood. 2.0 mL of the nerve block solution was injected without difficulty or complication. After waiting for at least 3 minutes, the ablation was performed. Once completed, the needle was removed intact. Radiofrequency lesioning (ablation):  Radiofrequency Generator: Medtronic AccurianTM AG 1000 RF Generator Sensory Stimulation Parameters: 50 Hz was used to locate & identify the nerve, making sure that the needle was positioned such that there was no sensory stimulation below 0.3 V or above 0.7 V. Motor Stimulation Parameters: 2 Hz was used to evaluate the motor component. Care was taken not to lesion any nerves that demonstrated motor stimulation of the lower extremities at an output of less than 2.5 times that of the sensory threshold, or a maximum of 2.0 V. Lesioning Technique Parameters: Standard Radiofrequency settings. (Not bipolar or pulsed.) Temperature Settings: 80 degrees C Lesioning time: 60 seconds Stationary intra-operative compliance: Compliant  Once the entire procedure was completed, the treated area was cleaned, making sure to leave some of  the prepping solution back to take advantage of its long term bactericidal properties.    Illustration of the posterior view of the lumbar spine and the posterior neural structures. Laminae of L2 through S1 are labeled. DPRL5, dorsal primary ramus of L5; DPRS1, dorsal primary ramus of S1; DPR3, dorsal primary ramus of L3; FJ, facet (zygapophyseal) joint L3-L4; I, inferior articular process of L4; LB1, lateral branch of dorsal primary ramus of L1; IAB, inferior articular branches from  L3 medial branch (supplies L4-L5 facet joint); IBP, intermediate branch plexus; MB3, medial branch of dorsal primary ramus of L3; NR3, third lumbar nerve root; S, superior articular process of L5; SAB, superior articular branches from L4 (supplies L4-5 facet joint also); TP3, transverse process of L3.  Facet Joint Innervation (* possible contribution)  L1-2 T12, L1 (L2*)  Medial Branch  L2-3 L1, L2 (L3*)         "          "  L3-4 L2, L3 (L4*)         "          "  L4-5 L3, L4 (L5*)         "          "  L5-S1 L4, L5, S1          "          "    Vitals:   03/13/24 1238 03/13/24 1245 03/13/24 1255 03/13/24 1305  BP: (!) 123/93 136/84 (!) 142/75 136/86  Pulse:      Resp: 17 17 17 16   Temp:  97.8 F (36.6 C)  97.6 F (36.4 C)  TempSrc:      SpO2: 99% 97% 96% 97%  Weight:      Height:        Start Time: 1206 hrs. End Time: 1239 hrs.  Imaging Guidance (Spinal):         Type of Imaging Technique: Fluoroscopy Guidance (Spinal) Indication(s): Fluoroscopy guidance for needle placement to enhance accuracy in procedures requiring precise needle localization for targeted delivery of medication in or near specific anatomical locations not easily accessible without such real-time imaging assistance. Exposure Time: Please see nurses notes. Contrast: None used. Fluoroscopic Guidance: I was personally present during the use of fluoroscopy. "Tunnel Vision Technique" used to obtain the best possible view of the target  area. Parallax error corrected before commencing the procedure. "Direction-depth-direction" technique used to introduce the needle under continuous pulsed fluoroscopy. Once target was reached, antero-posterior, oblique, and lateral fluoroscopic projection used confirm needle placement in all planes. Images permanently stored in EMR. Interpretation: No contrast injected. I personally interpreted the imaging intraoperatively. Adequate needle placement confirmed in multiple planes. Permanent images saved into the patient's record.  Antibiotic Prophylaxis:   Anti-infectives (From admission, onward)    None      Indication(s): None identified   Post-operative Assessment:  Post-procedure Vital Signs:  Pulse/HCG Rate: (!) 5578 Temp: 97.6 F (36.4 C) Resp: 16 BP: 136/86 SpO2: 97 %  EBL: None  Complications: No immediate post-treatment complications observed by team, or reported by patient.  Note: The patient tolerated the entire procedure well. A repeat set of vitals were taken after the procedure and the patient was kept under observation following institutional policy, for this type of procedure. Post-procedural neurological assessment was performed, showing return to baseline, prior to discharge. The patient was provided with post-procedure discharge instructions, including a section on how to identify potential problems. Should any problems arise concerning this procedure, the patient was given instructions to immediately contact us , at any time, without hesitation. In any case, we plan to contact the patient by telephone for a follow-up status report regarding this interventional procedure.  Comments:  No additional relevant information.  Plan of Care (POC)  Orders:  Orders Placed This Encounter  Procedures   Radiofrequency,Lumbar    Scheduling Instructions:     Side(s): Left-sided     Level: L3-4, L4-5,  and L5-S1 Facets (L2, L3, L4, L5, and S1 Medial Branch)     Sedation: With  Sedation.     Date: 03/13/2024    Where will this procedure be performed?:   [facility]   Radiofrequency,Lumbar    Standing Status:   Future    Expiration Date:   06/13/2024    Scheduling Instructions:     Side(s): Right-sided     Level: L3-4, L4-5, and L5-S1 Facets (L2, L3, L4, L5, and S1 Medial Branch)     Sedation: With Sedation.     Timeframe: 2 weeks from now.    Where will this procedure be performed?:   [facility]   DG PAIN [facility] C-ARM 1-60 MIN NO REPORT    Intraoperative interpretation by procedural physician at [facility].    Standing Status:   Standing    Number of Occurrences:   1    Reason for exam::   Assistance in needle guidance and placement for procedures requiring needle placement in or near specific anatomical locations not easily accessible without such assistance.   Informed Consent Details: Physician/Practitioner Attestation; Transcribe to consent form and obtain patient signature    Nursing Order: Transcribe to consent form and obtain patient signature. Note: Always confirm laterality of pain with Mr. Gunner, before procedure.    Physician/Practitioner attestation of informed consent for procedure/surgical case:   I, the physician/practitioner, attest that I have discussed with the patient the benefits, risks, side effects, alternatives, likelihood of achieving goals and potential problems during recovery for the procedure that I have provided informed consent.    Procedure:   Lumbar Facet Radiofrequency Ablation    Physician/Practitioner performing the procedure:   Elya Tarquinio A. Barth Borne, MD    Indication/Reason:   Low Back Pain, with our without leg pain, due to Facet Joint Arthralgia (Joint Pain) known as Lumbar Facet Syndrome, secondary to Lumbar, and/or Lumbosacral Spondylosis (Arthritis of the Spine), without myelopathy or radiculopathy (Nerve Damage).   Provide equipment / supplies at bedside    Procedure tray: "Radiofrequency  Tray" Additional material: Large hemostat (x1); Small hemostat (x1); Towels (x8); 4x4 sterile sponge pack (x1) Needle type: Teflon-coated Radiofrequency Needle (Disposable  single use) Size: Regular Quantity: 5    Standing Status:   Standing    Number of Occurrences:   1    Specify:   Radiofrequency Tray   Saline lock IV    Have LR [phone] mL available and administer at 125 mL/hr if patient becomes hypotensive.    Standing Status:   Standing    Number of Occurrences:   1     Analgesic: Tramadol  50 mg, 2 tabs PO q 6 hrs (400 mg/day of tramadol ). No more chronic opioid analgesics therapy prescribed by our practice. (07/20/2023) UDS: (+) unreported THC (Marijuana) Opioid analgesics stopped. MME/day: 40 mg/day.    Medications ordered for procedure: Meds ordered this encounter  Medications   lidocaine  (XYLOCAINE ) 2 % (with pres) injection 400 mg   pentafluoroprop-tetrafluoroeth (GEBAUERS) aerosol   midazolam  (VERSED ) 5 MG/5ML injection 0.5-2 mg    Make sure Flumazenil is available in the pyxis when using this medication. If oversedation occurs, administer 0.2 mg IV over 15 sec. If after 45 sec no response, administer 0.2 mg again over 1 min; may repeat at 1 min intervals; not to exceed 4 doses (1 mg)   fentaNYL  (SUBLIMAZE ) injection 25-50 mcg    Make sure Narcan  is available in the pyxis when using this medication. In the  event of respiratory depression (RR< 8/min): Titrate NARCAN  (naloxone ) in increments of 0.1 to 0.2 mg IV at 2-3 minute intervals, until desired degree of reversal.   ropivacaine  (PF) 2 mg/mL (0.2%) (NAROPIN ) injection 9 mL   triamcinolone  acetonide (KENALOG -40) injection 40 mg   HYDROcodone -acetaminophen  (NORCO/VICODIN) 5-325 MG tablet    Sig: Take 1 tablet by mouth every 6 (six) hours as needed for up to 7 days for severe pain (pain score 7-10). Must last 7 days.    Dispense:  28 tablet    Refill:  0    For acute post-operative pain. Not to be refilled. Must last 7  days.   HYDROcodone -acetaminophen  (NORCO/VICODIN) 5-325 MG tablet    Sig: Take 1 tablet by mouth every 6 (six) hours as needed for up to 7 days for severe pain (pain score 7-10). Must last 7 days.    Dispense:  28 tablet    Refill:  0    For acute post-operative pain. Not to be refilled.  Must last 7 days.   Medications administered: We administered lidocaine , pentafluoroprop-tetrafluoroeth, midazolam , fentaNYL , ropivacaine  (PF) 2 mg/mL (0.2%), and triamcinolone  acetonide.  See the medical record for exact dosing, route, and time of administration.    Interventional Therapies  Risk Factors  Considerations:   Stage3 CKD  HTN  Hx GI Bleed  Anemia w/ near syncope   Planned  Pending:   Therapeutic bilateral lumbar facet MBB #3    Under consideration:   Diagnostic/therapeutic L1-2 LESI #1  Diagnostic right IA hip joint injection  Diagnostic bilateral lumbar facet block  Possible bilateral lumbar facet RFA    Completed:   Diagnostic/therapeutic L1-2 LESI x1 (08/18/2023) (80/80/60/60) Diagnostic/therapeutic left L4 TFESI x3 + left L5-S1 LESI x1 (01/06/2023) (100/100/100/95) (LLEP:100  LBP:95)  Therapeutic midline C6-7 interspinous ligament inj. x1 (06/28/2022) (80/90/75/75)  Diagnostic bilateral lumbar facet MBB x2 (12/02/2022) (100/100/0/0) did not help his left leg pain. Therapeutic left cervical ESI x2 (02/05/2021) (100/100/70 x 10 days/50)  Therapeutic right cervical ESI x1 (12/15/2021) (90/90/90/90)  Therapeutic left L3-4 LESI x2 (06/13/2018) (100/100/85/85)  Therapeutic left L3 TFESI x1 (03/13/2020) (95/95/60/>50)  Therapeutic left L4 TFESI x2 (03/13/2020) (95/95/60/>50)  Therapeutic right L3 TFESI x2 (03/13/2020) (95/95/60/>50)  Therapeutic right L4 TFESI x3 (03/13/2020) (95/95/60/>50)  Referral to neurosurgery Nance Aw, MD) for cervical instability (11/11/2020)    Therapeutic  Palliative (PRN) options:   Palliative left L3-4 LESI   Palliative left L3 TFESI    Palliative left L4 TFESI   Palliative right L3 TFESI   Palliative right L4 TFESI        Follow-up plan:   Return in about 2 weeks (around 03/27/2024) for (ECT): (R) L-FCT RFA #1, (PPE).     Recent Visits Date Type Provider Dept  02/23/24 Office Visit Renaldo Caroli, MD [facility]  02/09/24 Procedure visit Renaldo Caroli, MD [facility]  01/30/24 Office Visit Renaldo Caroli, MD [facility]  Showing recent visits within past 90 days and meeting all other requirements Today's Visits Date Type Provider Dept  03/13/24 Procedure visit Renaldo Caroli, MD [facility]  Showing today's visits and meeting all other requirements Future Appointments No visits were found meeting these conditions. Showing future appointments within next 90 days and meeting all other requirements   Disposition: Discharge home  Discharge (Date  Time): 03/13/2024; 1313 hrs.   Primary Care Physician: Conchita Deem, MD Location: [facility] Note by: Candi Chafe, MD (TTS technology used. I apologize for  any typographical errors that were not detected and corrected.) Date: 03/13/2024; Time: 4:24 PM  Disclaimer:  Medicine is not an Visual merchandiser. The only guarantee in medicine is that nothing is guaranteed. It is important to note that the decision to proceed with this intervention was based on the information collected from the patient. The Data and conclusions were drawn from the patient's questionnaire, the interview, and the physical examination. Because the information was provided in large part by the patient, it cannot be guaranteed that it has not been purposely or unconsciously manipulated. Every effort has been made to obtain as much relevant data as possible for this evaluation. It is important to note that the conclusions that lead to this procedure are derived in large part from the available data. Always take into  account that the treatment will also be dependent on availability of resources and existing treatment guidelines, considered by other Pain Management Practitioners as being common knowledge and practice, at the time of the intervention. For Medico-Legal purposes, it is also important to point out that variation in procedural techniques and pharmacological choices are the acceptable norm. The indications, contraindications, technique, and results of the above procedure should only be interpreted and judged by a Board-Certified Interventional Pain Specialist with extensive familiarity and expertise in the same exact procedure and technique.

## 2024-03-13 NOTE — Patient Instructions (Addendum)
 ______________________________________________________________________    Post-Radiofrequency (RF) Discharge Instructions  You have just completed a Radiofrequency Neurotomy.  The following instructions will provide you with information and guidelines for self-care upon discharge.  If at any time you have questions or concerns please call your physician. DO NOT DRIVE YOURSELF!!  Instructions: Apply ice: Fill a plastic sandwich bag with crushed ice. Cover it with a small towel and apply to injection site. Apply for 15 minutes then remove x 15 minutes. Repeat sequence on day of procedure, until you go to bed. The purpose is to minimize swelling and discomfort after procedure. Apply heat: Apply heat to procedure site starting the day following the procedure. The purpose is to treat any soreness and discomfort from the procedure. Food intake: No eating limitations, unless stipulated above.  Nevertheless, if you have had sedation, you may experience some nausea.  In this case, it may be wise to wait at least two hours prior to resuming regular diet. Physical activities: Keep activities to a minimum for the first 8 hours after the procedure. For the first 24 hours after the procedure, do not drive a motor vehicle,  Operate heavy machinery, power tools, or handle any weapons.  Consider walking with the use of an assistive device or accompanied by an adult for the first 24 hours.  Do not drink alcoholic beverages including beer.  Do not make any important decisions or sign any legal documents. Go home and rest today.  Resume activities tomorrow, as tolerated.  Use caution in moving about as you may experience mild leg weakness.  Use caution in cooking, use of household electrical appliances and climbing steps. Driving: If you have received any sedation, you are not allowed to drive for 24 hours after your procedure. Blood thinner: Restart your blood thinner 6 hours after your procedure. (Only for those taking  blood thinners) Insulin: As soon as you can eat, you may resume your normal dosing schedule. (Only for those taking insulin) Medications: May resume pre-procedure medications.  Do not take any drugs, other than what has been prescribed to you. Infection prevention: Keep procedure site clean and dry. Post-procedure Pain Diary: Extremely important that this be done correctly and accurately. Recorded information will be used to determine the next step in treatment. Pain evaluated is that of treated area only. Do not include pain from an untreated area. Complete every hour, on the hour, for the initial 8 hours. Set an alarm to help you do this part accurately. Do not go to sleep and have it completed later. It will not be accurate. Follow-up appointment: Keep your follow-up appointment after the procedure. Usually 2-6 weeks after radiofrequency. Bring you pain diary. The information collected will be essential for your long-term care.   Expect: From numbing medicine (AKA: Local Anesthetics): Numbness or decrease in pain. Onset: Full effect within 15 minutes of injected. Duration: It will depend on the type of local anesthetic used. On the average, 1 to 8 hours.  From steroids (when added): Decrease in swelling or inflammation. Once inflammation is improved, relief of the pain will follow. Onset of benefits: Depends on the amount of swelling present. The more swelling, the longer it will take for the benefits to be seen. In some cases, up to 10 days. Duration: Steroids will stay in the system x 2 weeks. Duration of benefits will depend on multiple posibilities including persistent irritating factors. From procedure: Some discomfort is to be expected once the numbing medicine wears off. In the case of  radiofrequency procedures, this may last as long as 6 weeks. Additional post-procedure pain medication is provided for this. Discomfort is minimized if ice and heat are applied as instructed.  Call  if: You experience numbness and weakness that gets worse with time, as opposed to wearing off. He experience any unusual bleeding, difficulty breathing, or loss of the ability to control your bowel and bladder. (This applies to Spinal procedures only) You experience any redness, swelling, heat, red streaks, elevated temperature, fever, or any other signs of a possible infection.  Emergency Numbers: Durning business hours (Monday - Thursday, 8:00 AM - 4:00 PM) (Friday, 9:00 AM - 12:00 Noon): [phone] After hours: [phone] ______________________________________________________________________      ______________________________________________________________________    Procedure instructions  Stop blood-thinners  Do not eat or drink fluids (other than water) for 6 hours before your procedure  No water for 2 hours before your procedure  Take your blood pressure medicine with a sip of water  Arrive 30 minutes before your appointment  If sedation is planned, bring suitable driver. Teddie Favre, [place], & public transportation are NOT APPROVED)  Carefully read the "Preparing for your procedure" detailed instructions  If you have questions call us  at [phone]  Procedure appointments are for procedures only.   NO medication refills or new problem evaluations will be done on procedure days.   Only the scheduled, pre-approved procedure and side will be done.   ______________________________________________________________________      ______________________________________________________________________    Preparing for your procedure  Appointments: If you think you may not be able to keep your appointment, call 24-48 hours in advance to cancel. We need time to make it available to others.  Procedure visits are for procedures only. During your procedure appointment there will be: NO Prescription Refills*. NO medication changes or discussions*. NO discussion of  disability issues*. NO unrelated pain problem evaluations*. NO evaluations to order other pain procedures*. *These will be addressed at a separate and distinct evaluation encounter on the provider's evaluation schedule and not during procedure days.  Instructions: Food intake: Avoid eating anything solid for at least 8 hours prior to your procedure. Clear liquid intake: You may take clear liquids such as water up to 2 hours prior to your procedure. (No carbonated drinks. No soda.) Transportation: Unless otherwise stated by your physician, bring a driver. (Driver cannot be a Market researcher, Pharmacist, community, or any other form of public transportation.) Morning Medicines: Except for blood thinners, take all of your other morning medications with a sip of water. Make sure to take your heart and blood pressure medicines. If your blood pressure's lower number is above 100, the case will be rescheduled. Blood thinners: Make sure to stop your blood thinners as instructed.  If you take a blood thinner, but were not instructed to stop it, call our office [phone] and ask to talk to a nurse. Not stopping a blood thinner prior to certain procedures could lead to serious complications. Diabetics on insulin: Notify the staff so that you can be scheduled 1st case in the morning. If your diabetes requires high dose insulin, take only  of your normal insulin dose the morning of the procedure and notify the staff that you have done so. Preventing infections: Shower with an antibacterial soap the morning of your procedure.  Build-up your immune system: Take 1000 mg of Vitamin C with every meal (3 times a day) the day prior to your procedure. Antibiotics: Inform the nursing staff if you are taking  any antibiotics or if you have any conditions that may require antibiotics prior to procedures. (Example: recent joint implants)   Pregnancy: If you are pregnant make sure to notify the nursing staff. Not doing so may result in injury to the  fetus, including death.  Sickness: If you have a cold, fever, or any active infections, call and cancel or reschedule your procedure. Receiving steroids while having an infection may result in complications. Arrival: You must be in the facility at least 30 minutes prior to your scheduled procedure. Tardiness: Your scheduled time is also the cutoff time. If you do not arrive at least 15 minutes prior to your procedure, you will be rescheduled.  Children: Do not bring any children with you. Make arrangements to keep them home. Dress appropriately: There is always a possibility that your clothing may get soiled. Avoid long dresses. Valuables: Do not bring any jewelry or valuables.  Reasons to call and reschedule or cancel your procedure: (Following these recommendations will minimize the risk of a serious complication.) Surgeries: Avoid having procedures within 2 weeks of any surgery. (Avoid for 2 weeks before or after any surgery). Flu Shots: Avoid having procedures within 2 weeks of a flu shots or . (Avoid for 2 weeks before or after immunizations). Barium: Avoid having a procedure within 7-10 days after having had a radiological study involving the use of radiological contrast. (Myelograms, Barium swallow or enema study). Heart attacks: Avoid any elective procedures or surgeries for the initial 6 months after a "Myocardial Infarction" (Heart Attack). Blood thinners: It is imperative that you stop these medications before procedures. Let us  know if you if you take any blood thinner.  Infection: Avoid procedures during or within two weeks of an infection (including chest colds or gastrointestinal problems). Symptoms associated with infections include: Localized redness, fever, chills, night sweats or profuse sweating, burning sensation when voiding, cough, congestion, stuffiness, runny nose, sore throat, diarrhea, nausea, vomiting, cold or Flu symptoms, recent or current infections. It is specially  important if the infection is over the area that we intend to treat. Heart and lung problems: Symptoms that may suggest an active cardiopulmonary problem include: cough, chest pain, breathing difficulties or shortness of breath, dizziness, ankle swelling, uncontrolled high or unusually low blood pressure, and/or palpitations. If you are experiencing any of these symptoms, cancel your procedure and contact your primary care physician for an evaluation.  Remember:  Regular Business hours are:  Monday to Thursday 8:00 AM to 4:00 PM  Provider's Schedule: Renaldo Caroli, MD:  Procedure days: Tuesday and Thursday 7:30 AM to 4:00 PM  Cephus Collin, MD:  Procedure days: Monday and Wednesday 7:30 AM to 4:00 PM Last  Updated: 09/13/2023 ______________________________________________________________________      ______________________________________________________________________    General Risks and Possible Complications  Patient Responsibilities: It is important that you read this as it is part of your informed consent. It is our duty to inform you of the risks and possible complications associated with treatments offered to you. It is your responsibility as a patient to read this and to ask questions about anything that is not clear or that you believe was not covered in this document.  Patient's Rights: You have the right to refuse treatment. You also have the right to change your mind, even after initially having agreed to have the treatment done. However, under this last option, if you wait until the last second to change your mind, you may be charged for the materials used up to  that point.  Introduction: Medicine is not an Visual merchandiser. Everything in Medicine, including the lack of treatment(s), carries the potential for danger, harm, or loss (which is by definition: Risk). In Medicine, a complication is a secondary problem, condition, or disease that can aggravate an already existing  one. All treatments carry the risk of possible complications. The fact that a side effects or complications occurs, does not imply that the treatment was conducted incorrectly. It must be clearly understood that these can happen even when everything is done following the highest safety standards.  No treatment: You can choose not to proceed with the proposed treatment alternative. The "PRO(s)" would include: avoiding the risk of complications associated with the therapy. The "CON(s)" would include: not getting any of the treatment benefits. These benefits fall under one of three categories: diagnostic; therapeutic; and/or palliative. Diagnostic benefits include: getting information which can ultimately lead to improvement of the disease or symptom(s). Therapeutic benefits are those associated with the successful treatment of the disease. Finally, palliative benefits are those related to the decrease of the primary symptoms, without necessarily curing the condition (example: decreasing the pain from a flare-up of a chronic condition, such as incurable terminal cancer).  General Risks and Complications: These are associated to most interventional treatments. They can occur alone, or in combination. They fall under one of the following six (6) categories: no benefit or worsening of symptoms; bleeding; infection; nerve damage; allergic reactions; and/or death. No benefits or worsening of symptoms: In Medicine there are no guarantees, only probabilities. No healthcare provider can ever guarantee that a medical treatment will work, they can only state the probability that it may. Furthermore, there is always the possibility that the condition may worsen, either directly, or indirectly, as a consequence of the treatment. Bleeding: This is more common if the patient is taking a blood thinner, either prescription or over the counter (example: Goody Powders, Fish oil, Aspirin, Garlic, etc.), or if suffering a condition  associated with impaired coagulation (example: Hemophilia, cirrhosis of the liver, low platelet counts, etc.). However, even if you do not have one on these, it can still happen. If you have any of these conditions, or take one of these drugs, make sure to notify your treating physician. Infection: This is more common in patients with a compromised immune system, either due to disease (example: diabetes, cancer, human immunodeficiency virus [HIV], etc.), or due to medications or treatments (example: therapies used to treat cancer and rheumatological diseases). However, even if you do not have one on these, it can still happen. If you have any of these conditions, or take one of these drugs, make sure to notify your treating physician. Nerve Damage: This is more common when the treatment is an invasive one, but it can also happen with the use of medications, such as those used in the treatment of cancer. The damage can occur to small secondary nerves, or to large primary ones, such as those in the spinal cord and brain. This damage may be temporary or permanent and it may lead to impairments that can range from temporary numbness to permanent paralysis and/or brain death. Allergic Reactions: Any time a substance or material comes in contact with our body, there is the possibility of an allergic reaction. These can range from a mild skin rash (contact dermatitis) to a severe systemic reaction (anaphylactic reaction), which can result in death. Death: In general, any medical intervention can result in death, most of the time due to an  unforeseen complication. ______________________________________________________________________     Moderate Conscious Sedation, Adult, Care After After the procedure, it is common to have: Sleepiness for a few hours. Impaired judgment for a few hours. Trouble with balance. Nausea or vomiting if you eat too soon. Follow these instructions at home: For the time period you  were told by your health care provider:  Rest. Do not participate in activities where you could fall or become injured. Do not drive or use machinery. Do not drink alcohol. Do not take sleeping pills or medicines that cause drowsiness. Do not make important decisions or sign legal documents. Do not take care of children on your own. Eating and drinking Follow instructions from your health care provider about what you may eat and drink. Drink enough fluid to keep your urine pale yellow. If you vomit: Drink clear fluids slowly and in small amounts as you are able. Clear fluids include water, ice chips, low-calorie sports drinks, and fruit juice that has water added to it (diluted fruit juice). Eat light and bland foods in small amounts as you are able. These foods include bananas, applesauce, rice, lean meats, toast, and crackers. General instructions Take over-the-counter and prescription medicines only as told by your health care provider. Have a responsible adult stay with you for the time you are told. Do not use any products that contain nicotine or tobacco. These products include cigarettes, chewing tobacco, and vaping devices, such as e-cigarettes. If you need help quitting, ask your health care provider. Return to your normal activities as told by your health care provider. Ask your health care provider what activities are safe for you. Your health care provider may give you more instructions. Make sure you know what you can and cannot do. Contact a health care provider if: You are still sleepy or having trouble with balance after 24 hours. You feel light-headed. You vomit every time you eat or drink. You get a rash. You have a fever. You have redness or swelling around the IV site. Get help right away if: You have trouble breathing. You start to feel confused at home. These symptoms may be an emergency. Get help right away. Call 911. Do not wait to see if the symptoms will go  away. Do not drive yourself to the hospital. This information is not intended to replace advice given to you by your health care provider. Make sure you discuss any questions you have with your health care provider. Document Revised: 04/05/2022 Document Reviewed: 04/05/2022 Elsevier Patient Education  2024 [employer].

## 2024-03-14 ENCOUNTER — Telehealth: Payer: Self-pay | Admitting: *Deleted

## 2024-03-14 NOTE — Telephone Encounter (Signed)
 No problems post procedure.

## 2024-03-19 ENCOUNTER — Telehealth: Payer: Self-pay

## 2024-03-19 NOTE — Telephone Encounter (Signed)
 Colonoscopy not due until 08/08/2029.  Last one performed by Dr. Corky Diener 08/09/2019. No polyps. Recommended repeat in 10 years.  Thanks, [place], [place]

## 2024-03-20 ENCOUNTER — Telehealth: Payer: Self-pay | Admitting: Pain Medicine

## 2024-03-20 NOTE — Telephone Encounter (Signed)
 Telephone call from patient stating that he was having some pain after his procedure. He would like something sent in to the pharmacy. Patient states provider told him to call if he needed pain medication.

## 2024-03-29 ENCOUNTER — Ambulatory Visit
Admission: RE | Admit: 2024-03-29 | Discharge: 2024-03-29 | Disposition: A | Discharge status: Home / Self Care | Payer: Medicare (Managed Care) | Source: Ambulatory Visit | Attending: Pain Medicine | Admitting: Pain Medicine

## 2024-03-29 ENCOUNTER — Encounter: Payer: Self-pay | Admitting: Pain Medicine

## 2024-03-29 ENCOUNTER — Ambulatory Visit (HOSPITAL_BASED_OUTPATIENT_CLINIC_OR_DEPARTMENT_OTHER): Payer: Medicare (Managed Care) | Admitting: Pain Medicine

## 2024-03-29 VITALS — BP 126/67 | HR 57 | Temp 97.6°F | Resp 16 | Ht 72.0 in | Wt 180.0 lb

## 2024-03-29 DIAGNOSIS — R937 Abnormal findings on diagnostic imaging of other parts of musculoskeletal system: Secondary | ICD-10-CM | POA: Diagnosis present

## 2024-03-29 DIAGNOSIS — Z09 Encounter for follow-up examination after completed treatment for conditions other than malignant neoplasm: Secondary | ICD-10-CM

## 2024-03-29 DIAGNOSIS — G8929 Other chronic pain: Secondary | ICD-10-CM

## 2024-03-29 DIAGNOSIS — M545 Low back pain, unspecified: Secondary | ICD-10-CM

## 2024-03-29 DIAGNOSIS — M5459 Other low back pain: Secondary | ICD-10-CM

## 2024-03-29 DIAGNOSIS — G8918 Other acute postprocedural pain: Secondary | ICD-10-CM | POA: Diagnosis present

## 2024-03-29 DIAGNOSIS — Z5189 Encounter for other specified aftercare: Secondary | ICD-10-CM | POA: Insufficient documentation

## 2024-03-29 DIAGNOSIS — M4310 Spondylolisthesis, site unspecified: Secondary | ICD-10-CM | POA: Diagnosis present

## 2024-03-29 DIAGNOSIS — M79605 Pain in left leg: Secondary | ICD-10-CM | POA: Diagnosis present

## 2024-03-29 DIAGNOSIS — M47817 Spondylosis without myelopathy or radiculopathy, lumbosacral region: Secondary | ICD-10-CM | POA: Insufficient documentation

## 2024-03-29 DIAGNOSIS — M47816 Spondylosis without myelopathy or radiculopathy, lumbar region: Secondary | ICD-10-CM

## 2024-03-29 MED ORDER — FENTANYL CITRATE (PF) 100 MCG/2ML IJ SOLN
INTRAMUSCULAR | Status: AC
  Filled 2024-03-29: qty 2

## 2024-03-29 MED ORDER — PENTAFLUOROPROP-TETRAFLUOROETH EX AERO
INHALATION_SPRAY | Freq: Once | CUTANEOUS | Status: AC
  Administered 2024-03-29: 30 via TOPICAL

## 2024-03-29 MED ORDER — TRIAMCINOLONE ACETONIDE 40 MG/ML IJ SUSP
INTRAMUSCULAR | Status: AC
Start: 2024-03-29 — End: 2024-03-29
  Filled 2024-03-29: qty 1

## 2024-03-29 MED ORDER — LIDOCAINE HCL 2 % IJ SOLN
20.0000 mL | Freq: Once | INTRAMUSCULAR | Status: AC
  Administered 2024-03-29: 400 mg

## 2024-03-29 MED ORDER — ROPIVACAINE HCL 2 MG/ML IJ SOLN
9.0000 mL | Freq: Once | INTRAMUSCULAR | Status: AC
  Administered 2024-03-29: 9 mL via PERINEURAL

## 2024-03-29 MED ORDER — HYDROCODONE-ACETAMINOPHEN 5-325 MG PO TABS: 1.0000 | ORAL_TABLET | Freq: Four times a day (QID) | ORAL | 0 refills | Status: AC | PRN

## 2024-03-29 MED ORDER — TRIAMCINOLONE ACETONIDE 40 MG/ML IJ SUSP
40.0000 mg | Freq: Once | INTRAMUSCULAR | Status: AC
  Administered 2024-03-29: 40 mg

## 2024-03-29 MED ORDER — MIDAZOLAM HCL 5 MG/5ML IJ SOLN
INTRAMUSCULAR | Status: AC
  Filled 2024-03-29: qty 5

## 2024-03-29 MED ORDER — ROPIVACAINE HCL 2 MG/ML IJ SOLN
INTRAMUSCULAR | Status: AC
Start: 2024-03-29 — End: 2024-03-29
  Filled 2024-03-29: qty 20

## 2024-03-29 MED ORDER — HYDROCODONE-ACETAMINOPHEN 5-325 MG PO TABS
1.0000 | ORAL_TABLET | Freq: Four times a day (QID) | ORAL | 0 refills | Status: AC | PRN
Start: 2024-03-29 — End: 2024-04-05

## 2024-03-29 MED ORDER — LIDOCAINE HCL 2 % IJ SOLN
INTRAMUSCULAR | Status: AC
  Filled 2024-03-29: qty 20

## 2024-03-29 MED ORDER — MIDAZOLAM HCL 5 MG/5ML IJ SOLN
0.5000 mg | Freq: Once | INTRAMUSCULAR | Status: AC
  Administered 2024-03-29: 2 mg via INTRAVENOUS

## 2024-03-29 MED ORDER — FENTANYL CITRATE (PF) 100 MCG/2ML IJ SOLN
25.0000 ug | INTRAMUSCULAR | Status: DC | PRN
  Administered 2024-03-29: 50 ug via INTRAVENOUS

## 2024-03-29 NOTE — Progress Notes (Signed)
 Safety precautions to be maintained throughout the outpatient stay will include: orient to surroundings, keep bed in low position, maintain call bell within reach at all times, provide assistance with transfer out of bed and ambulation.

## 2024-03-29 NOTE — Progress Notes (Signed)
 PROVIDER NOTE: Interpretation of information contained herein should be left to medically-trained personnel. Specific patient instructions are provided elsewhere under Patient Instructions section of medical record. This document was created in part using STT-dictation technology, any transcriptional errors that may result from this process are unintentional.  Patient: Caleb Edwards Type: Established DOB: Jun 29, 1951 MRN: [id number] PCP: Nola Hong, MD  Service: Procedure DOS: 03/29/2024 Setting: Ambulatory Location: Ambulatory outpatient facility Delivery: Face-to-face Provider: Eric DELENA Como, MD Specialty: Interventional Pain Management Specialty designation: 09 Location: Outpatient facility Ref. Prov.: Como Eric, MD       Interventional Therapy   Procedure: Lumbar Facet, Medial Branch Radiofrequency Ablation (RFA) #1  Laterality: Right (-RT)  Level: L2, L3, L4, L5, and S1 Medial Branch Level(s). These levels will denervate the L3-4, L4-5, and L5-S1 lumbar facet joints.  Imaging: Fluoroscopy-guided Spinal ([id number]) Anesthesia: Local anesthesia (1-2% Lidocaine ) Anxiolysis: IV Versed  2.0 mg Sedation: Moderate Sedation Fentanyl  1 mL (50 mcg) DOS: 03/29/2024  Performed by: Eric DELENA Como, MD  Purpose: Therapeutic/Palliative Indications: Low back pain severe enough to impact quality of life or function. Indications: 1. Chronic low back pain (Bilateral) w/o sciatica   2. Lumbar facet joint pain   3. Grade 1 Anterolisthesis (7-55mm) of L5 over S1   4. Lumbar facet arthropathy   5. Lumbar facet syndrome (Bilateral) (R>L)   6. Spondylosis without myelopathy or radiculopathy, lumbosacral region   7. Low back pain of over 3 months duration   8. Low back pain radiating to left leg   9. Mechanical low back pain   10. Multifactorial low back pain    Caleb Edwards has been dealing with the above chronic pain for longer than three months and has either failed to  respond, was unable to tolerate, or simply did not get enough benefit from other more conservative therapies including, but not limited to: 1. Over-the-counter medications 2. Anti-inflammatory medications 3. Muscle relaxants 4. Membrane stabilizers 5. Opioids 6. Physical therapy and/or chiropractic manipulation 7. Modalities (Heat, ice, etc.) 8. Invasive techniques such as nerve blocks. Caleb Edwards has attained more than 50% relief of the pain from a series of diagnostic injections conducted in separate occasions.  Pain Score: Pre-procedure: 7 /10 Post-procedure: 0-No pain/10     Position / Prep / Materials:  Position: Prone  Prep solution: ChloraPrep (2% chlorhexidine gluconate and 70% isopropyl alcohol) Prep Area: Entire Lumbosacral Region (Lower back from mid-thoracic region to end of tailbone and from flank to flank.) Materials:  Tray: RFA (Radiofrequency) tray Needle(s):  Type: RFA (Teflon-coated radiofrequency ablation needles) Gauge (G): 22  Length: Regular (10cm) Qty: 5      Post-Procedure Evaluation   Procedure: Lumbar Facet, Medial Branch Radiofrequency Ablation (RFA) #1  Laterality: Left (-LT)  Level: L2, L3, L4, L5, and S1 Medial Branch Level(s). These levels will denervate the L3-4, L4-5, and L5-S1 lumbar facet joints.  Imaging: Fluoroscopy-guided Spinal ([id number]) Anesthesia: Local anesthesia (1-2% Lidocaine ) Anxiolysis: IV Versed  1.0 mg Sedation: Moderate Sedation Fentanyl  1 mL (50 mcg) DOS: 03/13/2024  Performed by: Eric DELENA Como, MD  Purpose: Therapeutic/Palliative Indications: Low back pain severe enough to impact quality of life or function.  Pain Score: Pre-procedure: 10-Worst pain ever/10 Post-procedure: 0-No pain/10    Effectiveness:  Initial hour after procedure: 100 %. Subsequent 4-6 hours post-procedure: 100 %. Analgesia past initial 6 hours: 50 %.  (Radiofrequency ablation was done on 03/13/2024, only 16 days ago and therefore full  benefit can be appreciated until after the patient's 6-week postprocedure.  So far he can tell an ongoing 50% improvement on the right side.  He also indicates that the pain seems to have changed from being constant to only being intermittent.)  H&P (Pre-op Assessment):  Caleb Edwards is a 73 y.o. (year old), male patient, seen today for interventional treatment. He  has a past surgical history that includes Esophagogastroduodenoscopy (egd) with propofol  (N/A, 01/03/2016); Colonoscopy with propofol  (N/A, 08/02/2017); Esophagogastroduodenoscopy (egd) with propofol  (08/02/2017); Esophagogastroduodenoscopy (egd) with propofol  (N/A, 02/05/2018); Colonoscopy with propofol  (N/A, 02/05/2018); Esophagogastroduodenoscopy (egd) with propofol  (N/A, 03/30/2018); Esophagogastroduodenoscopy (egd) with propofol  (N/A, 08/09/2019); Colonoscopy with propofol  (N/A, 08/09/2019); Givens capsule study (N/A, 04/24/2020); Esophagogastroduodenoscopy (egd) with propofol  (N/A, 04/24/2020); and Maxillary antrostomy (Left, 12/10/2022). Caleb Edwards has a current medication list which includes the following prescription(s): acetaminophen , amlodipine , chlorthalidone , cyclobenzaprine , ferrous sulfate , hydrocodone -acetaminophen , [START ON 04/05/2024] hydrocodone -acetaminophen , ibuprofen, lidocaine , lisinopril , naloxone , pantoprazole , rosuvastatin, sildenafil, and trazodone, and the following Facility-Administered Medications: fentanyl . His primarily concern today is the Back Pain (Lower Bilateral, Right side worse )  Initial Vital Signs:  Pulse/HCG Rate: (!) 57ECG Heart Rate: 66 (nsr) Temp: 98.2 F (36.8 C) Resp: 18 BP: 131/75 SpO2: 100 %  BMI: Estimated body mass index is 24.41 kg/m as calculated from the following:   Height as of this encounter: 6' (1.829 m).   Weight as of this encounter: 180 lb (81.6 kg).  Risk Assessment: Allergies: Reviewed. He has no known allergies.  Allergy Precautions: None required Coagulopathies: Reviewed. None  identified.  Blood-thinner therapy: None at this time Active Infection(s): Reviewed. None identified. Caleb Edwards is afebrile  Site Confirmation: Caleb Edwards was asked to confirm the procedure and laterality before marking the site Procedure checklist: Completed Consent: Before the procedure and under the influence of no sedative(s), amnesic(s), or anxiolytics, the patient was informed of the treatment options, risks and possible complications. To fulfill our ethical and legal obligations, as recommended by the American Medical Association's Code of Ethics, I have informed the patient of my clinical impression; the nature and purpose of the treatment or procedure; the risks, benefits, and possible complications of the intervention; the alternatives, including doing nothing; the risk(s) and benefit(s) of the alternative treatment(s) or procedure(s); and the risk(s) and benefit(s) of doing nothing. The patient was provided information about the general risks and possible complications associated with the procedure. These may include, but are not limited to: failure to achieve desired goals, infection, bleeding, organ or nerve damage, allergic reactions, paralysis, and death. In addition, the patient was informed of those risks and complications associated to Spine-related procedures, such as failure to decrease pain; infection (i.e.: Meningitis, epidural or intraspinal abscess); bleeding (i.e.: epidural hematoma, subarachnoid hemorrhage, or any other type of intraspinal or peri-dural bleeding); organ or nerve damage (i.e.: Any type of peripheral nerve, nerve root, or spinal cord injury) with subsequent damage to sensory, motor, and/or autonomic systems, resulting in permanent pain, numbness, and/or weakness of one or several areas of the body; allergic reactions; (i.e.: anaphylactic reaction); and/or death. Furthermore, the patient was informed of those risks and complications associated with the medications.  These include, but are not limited to: allergic reactions (i.e.: anaphylactic or anaphylactoid reaction(s)); adrenal axis suppression; blood sugar elevation that in diabetics may result in ketoacidosis or comma; water retention that in patients with history of congestive heart failure may result in shortness of breath, pulmonary edema, and decompensation with resultant heart failure; weight gain; swelling or edema; medication-induced neural toxicity; particulate matter embolism and blood vessel occlusion with resultant organ, and/or nervous  system infarction; and/or aseptic necrosis of one or more joints. Finally, the patient was informed that Medicine is not an exact science; therefore, there is also the possibility of unforeseen or unpredictable risks and/or possible complications that may result in a catastrophic outcome. The patient indicated having understood very clearly. We have given the patient no guarantees and we have made no promises. Enough time was given to the patient to ask questions, all of which were answered to the patient's satisfaction. Mr. Hausen has indicated that he wanted to continue with the procedure. Attestation: I, the ordering provider, attest that I have discussed with the patient the benefits, risks, side-effects, alternatives, likelihood of achieving goals, and potential problems during recovery for the procedure that I have provided informed consent. Date  Time: 03/29/2024 10:08 AM  Pre-Procedure Preparation:  Monitoring: As per clinic protocol. Respiration, ETCO2, SpO2, BP, heart rate and rhythm monitor placed and checked for adequate function Safety Precautions: Patient was assessed for positional comfort and pressure points before starting the procedure. Time-out: I initiated and conducted the Time-out before starting the procedure, as per protocol. The patient was asked to participate by confirming the accuracy of the Time Out information. Verification of the correct  person, site, and procedure were performed and confirmed by me, the nursing staff, and the patient. Time-out conducted as per Joint Commission's Universal Protocol (UP.01.01.01). Time: 1036 Start Time: 1036 hrs.  Description of Procedure:          Laterality: See above. Levels:  See above. Safety Precautions: Aspiration looking for blood return was conducted prior to all injections. At no point did we inject any substances, as a needle was being advanced. Before injecting, the patient was told to immediately notify me if he was experiencing any new onset of ringing in the ears, or metallic taste in the mouth. No attempts were made at seeking any paresthesias. Safe injection practices and needle disposal techniques used. Medications properly checked for expiration dates. SDV (single dose vial) medications used. After the completion of the procedure, all disposable equipment used was discarded in the proper designated medical waste containers. Local Anesthesia: Protocol guidelines were followed. The patient was positioned over the fluoroscopy table. The area was prepped in the usual manner. The time-out was completed. The target area was identified using fluoroscopy. A 12-in long, straight, sterile hemostat was used with fluoroscopic guidance to locate the targets for each level blocked. Once located, the skin was marked with an approved surgical skin marker. Once all sites were marked, the skin (epidermis, dermis, and hypodermis), as well as deeper tissues (fat, connective tissue and muscle) were infiltrated with a small amount of a short-acting local anesthetic, loaded on a 10cc syringe with a 25G, 1.5-in  Needle. An appropriate amount of time was allowed for local anesthetics to take effect before proceeding to the next step. Technical description of process:  Radiofrequency Ablation (RFA) L2 Medial Branch Nerve RFA: The target area for the L2 medial branch is at the junction of the postero-lateral  aspect of the superior articular process and the superior, posterior, and medial edge of the transverse process of L3. Under fluoroscopic guidance, a Radiofrequency needle was inserted until contact was made with os over the superior postero-lateral aspect of the pedicular shadow (target area). Sensory and motor testing was conducted to properly adjust the position of the needle. Once satisfactory placement of the needle was achieved, the numbing solution was slowly injected after negative aspiration for blood. 2.0 mL of the nerve block  solution was injected without difficulty or complication. After waiting for at least 3 minutes, the ablation was performed. Once completed, the needle was removed intact. L3 Medial Branch Nerve RFA: The target area for the L3 medial branch is at the junction of the postero-lateral aspect of the superior articular process and the superior, posterior, and medial edge of the transverse process of L4. Under fluoroscopic guidance, a Radiofrequency needle was inserted until contact was made with os over the superior postero-lateral aspect of the pedicular shadow (target area). Sensory and motor testing was conducted to properly adjust the position of the needle. Once satisfactory placement of the needle was achieved, the numbing solution was slowly injected after negative aspiration for blood. 2.0 mL of the nerve block solution was injected without difficulty or complication. After waiting for at least 3 minutes, the ablation was performed. Once completed, the needle was removed intact. L4 Medial Branch Nerve RFA: The target area for the L4 medial branch is at the junction of the postero-lateral aspect of the superior articular process and the superior, posterior, and medial edge of the transverse process of L5. Under fluoroscopic guidance, a Radiofrequency needle was inserted until contact was made with os over the superior postero-lateral aspect of the pedicular shadow (target area).  Sensory and motor testing was conducted to properly adjust the position of the needle. Once satisfactory placement of the needle was achieved, the numbing solution was slowly injected after negative aspiration for blood. 2.0 mL of the nerve block solution was injected without difficulty or complication. After waiting for at least 3 minutes, the ablation was performed. Once completed, the needle was removed intact. L5 Medial Branch Nerve RFA: The target area for the L5 medial branch is at the junction of the postero-lateral aspect of the superior articular process of S1 and the superior, posterior, and medial edge of the sacral ala. Under fluoroscopic guidance, a Radiofrequency needle was inserted until contact was made with os over the superior postero-lateral aspect of the pedicular shadow (target area). Sensory and motor testing was conducted to properly adjust the position of the needle. Once satisfactory placement of the needle was achieved, the numbing solution was slowly injected after negative aspiration for blood. 2.0 mL of the nerve block solution was injected without difficulty or complication. After waiting for at least 3 minutes, the ablation was performed. Once completed, the needle was removed intact. S1 Medial Branch Nerve RFA: The target area for the S1 medial branch is located inferior to the junction of the S1 superior articular process and the L5 inferior articular process, posterior, inferior, and lateral to the 6 o'clock position of the L5-S1 facet joint, just superior to the S1 posterior foramen. Under fluoroscopic guidance, the Radiofrequency needle was advanced until contact was made with os over the Target area. Sensory and motor testing was conducted to properly adjust the position of the needle. Once satisfactory placement of the needle was achieved, the numbing solution was slowly injected after negative aspiration for blood. 2.0 mL of the nerve block solution was injected without  difficulty or complication. After waiting for at least 3 minutes, the ablation was performed. Once completed, the needle was removed intact. Radiofrequency lesioning (ablation):  Radiofrequency Generator: Medtronic AccurianTM AG 1000 RF Generator Sensory Stimulation Parameters: 50 Hz was used to locate & identify the nerve, making sure that the needle was positioned such that there was no sensory stimulation below 0.3 V or above 0.7 V. Motor Stimulation Parameters: 2 Hz was used to evaluate  the motor component. Care was taken not to lesion any nerves that demonstrated motor stimulation of the lower extremities at an output of less than 2.5 times that of the sensory threshold, or a maximum of 2.0 V. Lesioning Technique Parameters: Standard Radiofrequency settings. (Not bipolar or pulsed.) Temperature Settings: 80 degrees C Lesioning time: 60 seconds Stationary intra-operative compliance: Compliant  Once the entire procedure was completed, the treated area was cleaned, making sure to leave some of the prepping solution back to take advantage of its long term bactericidal properties.    Illustration of the posterior view of the lumbar spine and the posterior neural structures. Laminae of L2 through S1 are labeled. DPRL5, dorsal primary ramus of L5; DPRS1, dorsal primary ramus of S1; DPR3, dorsal primary ramus of L3; FJ, facet (zygapophyseal) joint L3-L4; I, inferior articular process of L4; LB1, lateral branch of dorsal primary ramus of L1; IAB, inferior articular branches from L3 medial branch (supplies L4-L5 facet joint); IBP, intermediate branch plexus; MB3, medial branch of dorsal primary ramus of L3; NR3, third lumbar nerve root; S, superior articular process of L5; SAB, superior articular branches from L4 (supplies L4-5 facet joint also); TP3, transverse process of L3.  Facet Joint Innervation (* possible contribution)  L1-2 T12, L1 (L2*)  Medial Branch  L2-3 L1, L2 (L3*)                      L3-4 L2, L3 (L4*)                     L4-5 L3, L4 (L5*)                     L5-S1 L4, L5, S1                        Vitals:   03/29/24 1108 03/29/24 1116 03/29/24 1126 03/29/24 1134  BP:  116/88 132/60 126/67  Pulse:      Resp: 14 13 16 16   Temp:  (!) 97.5 F (36.4 C)  97.6 F (36.4 C)  TempSrc:  Temporal    SpO2: 100% 99% 99% 99%  Weight:      Height:        Start Time: 1036 hrs. End Time: 1108 hrs.  Imaging Guidance (Spinal):         Type of Imaging Technique: Fluoroscopy Guidance (Spinal) Indication(s): Fluoroscopy guidance for needle placement to enhance accuracy in procedures requiring precise needle localization for targeted delivery of medication in or near specific anatomical locations not easily accessible without such real-time imaging assistance. Exposure Time: Please see nurses notes. Contrast: None used. Fluoroscopic Guidance: I was personally present during the use of fluoroscopy. Tunnel Vision Technique used to obtain the best possible view of the target area. Parallax error corrected before commencing the procedure. Direction-depth-direction technique used to introduce the needle under continuous pulsed fluoroscopy. Once target was reached, antero-posterior, oblique, and lateral fluoroscopic projection used confirm needle placement in all planes. Images permanently stored in EMR. Interpretation: No contrast injected. I personally interpreted the imaging intraoperatively. Adequate needle placement confirmed in multiple planes. Permanent images saved into the patient's record.  Antibiotic Prophylaxis:   Anti-infectives (From admission, onward)    None      Indication(s): None identified  Post-operative Assessment:  Post-procedure Vital Signs:  Pulse/HCG Rate: (!) 57(!) 48 Temp: 97.6 F (36.4 C) Resp: 16 BP: 126/67 SpO2: 99 %  EBL: None  Complications: No immediate post-treatment complications observed by team, or reported by  patient.  Note: The patient tolerated the entire procedure well. A repeat set of vitals were taken after the procedure and the patient was kept under observation following institutional policy, for this type of procedure. Post-procedural neurological assessment was performed, showing return to baseline, prior to discharge. The patient was provided with post-procedure discharge instructions, including a section on how to identify potential problems. Should any problems arise concerning this procedure, the patient was given instructions to immediately contact us , at any time, without hesitation. In any case, we plan to contact the patient by telephone for a follow-up status report regarding this interventional procedure.  Comments:  No additional relevant information.  Plan of Care (POC)  Orders:  Orders Placed This Encounter  Procedures   Radiofrequency,Lumbar    Scheduling Instructions:     Side(s): Right-sided     Level: L3-4, L4-5, and L5-S1 Facets (L2, L3, L4, L5, and S1 Medial Branch)     Sedation: With Sedation.     Date: 03/29/2024    Where will this procedure be performed?:   [facility]   [facility] C-ARM 1-60 MIN NO REPORT    Intraoperative interpretation by procedural physician at [facility].    Standing Status:   Standing    Number of Occurrences:   1    Reason for exam::   Assistance in needle guidance and placement for procedures requiring needle placement in or near specific anatomical locations not easily accessible without such assistance.   Nursing Instructions:    Please complete this patient's postprocedure evaluation.    Scheduling Instructions:     Please complete this patient's postprocedure evaluation.   Informed Consent Details: Physician/Practitioner Attestation; Transcribe to consent form and obtain patient signature    Nursing Order: Transcribe to consent form and obtain patient signature. Note: Always confirm laterality of pain with Mr.  Tal, before procedure.    Physician/Practitioner attestation of informed consent for procedure/surgical case:   I, the physician/practitioner, attest that I have discussed with the patient the benefits, risks, side effects, alternatives, likelihood of achieving goals and potential problems during recovery for the procedure that I have provided informed consent.    Procedure:   Lumbar Facet Radiofrequency Ablation    Physician/Practitioner performing the procedure:   Kahleah Crass A. Tanya, MD    Indication/Reason:   Low Back Pain, with our without leg pain, due to Facet Joint Arthralgia (Joint Pain) known as Lumbar Facet Syndrome, secondary to Lumbar, and/or Lumbosacral Spondylosis (Arthritis of the Spine), without myelopathy or radiculopathy (Nerve Damage).   Provide equipment / supplies at bedside    Procedure tray: Radiofrequency Tray Additional material: Large hemostat (x1); Small hemostat (x1); Towels (x8); 4x4 sterile sponge pack (x1) Needle type: Teflon-coated Radiofrequency Needle (Disposable  single use) Size: Regular Quantity: 5    Standing Status:   Standing    Number of Occurrences:   1    Specify:   Radiofrequency Tray   Saline lock IV    Have LR [phone] available and administer at 125 mL/hr if patient becomes hypotensive.    Standing Status:   Standing    Number of Occurrences:   1    Opioid Analgesic(s):  Analgesic: Tramadol  50 mg, 2 tabs PO q 6 hrs (400 mg/day of tramadol ). No more chronic opioid analgesics therapy prescribed by our practice. (07/20/2023) UDS: (+) unreported THC (Marijuana) Opioid analgesics stopped. MME/day: 40 mg/day.  Medications ordered for procedure: Meds ordered this encounter  Medications   lidocaine  (XYLOCAINE ) 2 % (with pres) injection 400 mg   pentafluoroprop-tetrafluoroeth (GEBAUERS) aerosol   midazolam  (VERSED ) 5 MG/5ML injection 0.5-2 mg    Make sure Flumazenil is available in the pyxis when using this medication. If oversedation  occurs, administer 0.2 mg IV over 15 sec. If after 45 sec no response, administer 0.2 mg again over 1 min; may repeat at 1 min intervals; not to exceed 4 doses (1 mg)   fentaNYL  (SUBLIMAZE ) injection 25-50 mcg    Make sure Narcan  is available in the pyxis when using this medication. In the event of respiratory depression (RR< 8/min): Titrate NARCAN  (naloxone ) in increments of 0.1 to 0.2 mg IV at 2-3 minute intervals, until desired degree of reversal.   ropivacaine  (PF) 2 mg/mL (0.2%) (NAROPIN ) injection 9 mL   triamcinolone  acetonide (KENALOG -40) injection 40 mg   HYDROcodone -acetaminophen  (NORCO/VICODIN) 5-325 MG tablet    Sig: Take 1 tablet by mouth every 6 (six) hours as needed for up to 7 days for severe pain (pain score 7-10). Must last 7 days.    Dispense:  28 tablet    Refill:  0    For acute post-operative pain. Not to be refilled. Must last 7 days.   HYDROcodone -acetaminophen  (NORCO/VICODIN) 5-325 MG tablet    Sig: Take 1 tablet by mouth every 6 (six) hours as needed for up to 7 days for severe pain (pain score 7-10). Must last 7 days.    Dispense:  28 tablet    Refill:  0    For acute post-operative pain. Not to be refilled.  Must last 7 days.   Medications administered: We administered lidocaine , pentafluoroprop-tetrafluoroeth, midazolam , fentaNYL , ropivacaine  (PF) 2 mg/mL (0.2%), and triamcinolone  acetonide.  See the medical record for exact dosing, route, and time of administration.    Interventional Therapies  Risk Factors  Considerations:   Stage3 CKD  HTN  Hx GI Bleed  Anemia w/ near syncope   Planned  Pending:   Therapeutic bilateral lumbar facet MBB #3    Under consideration:   Diagnostic/therapeutic L1-2 LESI #1  Diagnostic right IA hip joint injection  Diagnostic bilateral lumbar facet block  Possible bilateral lumbar facet RFA    Completed:   Diagnostic/therapeutic L1-2 LESI x1 (08/18/2023) (80/80/60/60) Diagnostic/therapeutic left L4 TFESI x3 + left  L5-S1 LESI x1 (01/06/2023) (100/100/100/95) (LLEP:100  LBP:95)  Therapeutic midline C6-7 interspinous ligament inj. x1 (06/28/2022) (80/90/75/75)  Diagnostic bilateral lumbar facet MBB x2 (12/02/2022) (100/100/0/0) did not help his left leg pain. Therapeutic left cervical ESI x2 (02/05/2021) (100/100/70 x 10 days/50)  Therapeutic right cervical ESI x1 (12/15/2021) (90/90/90/90)  Therapeutic left L3-4 LESI x2 (06/13/2018) (100/100/85/85)  Therapeutic left L3 TFESI x1 (03/13/2020) (95/95/60/>50)  Therapeutic left L4 TFESI x2 (03/13/2020) (95/95/60/>50)  Therapeutic right L3 TFESI x2 (03/13/2020) (95/95/60/>50)  Therapeutic right L4 TFESI x3 (03/13/2020) (95/95/60/>50)  Referral to neurosurgery Camilla Nine, MD) for cervical instability (11/11/2020)    Therapeutic  Palliative (PRN) options:   Palliative left L3-4 LESI   Palliative left L3 TFESI   Palliative left L4 TFESI   Palliative right L3 TFESI   Palliative right L4 TFESI        Follow-up plan:   Return in about 6 weeks (around 05/10/2024) for (Face2F), (PPE).     Recent Visits Date Type Provider Dept  03/13/24 Procedure visit Tanya Glisson, MD [facility]  02/23/24 Office Visit Tanya Glisson, MD [facility]  02/09/24  Procedure visit Tanya Glisson, MD [facility]  01/30/24 Office Visit Tanya Glisson, MD [facility]  Showing recent visits within past 90 days and meeting all other requirements Today's Visits Date Type Provider Dept  03/29/24 Procedure visit Tanya Glisson, MD [facility]  Showing today's visits and meeting all other requirements Future Appointments Date Type Provider Dept  05/10/24 Appointment Tanya Glisson, MD [facility]  Showing future appointments within next 90 days and meeting all other requirements   Disposition: Discharge home  Discharge (Date  Time): 03/29/2024; 1137 hrs.   Primary Care Physician: Nola Hong,  MD Location: [facility] Note by: Glisson DELENA Tanya, MD (TTS technology used. I apologize for any typographical errors that were not detected and corrected.) Date: 03/29/2024; Time: 11:46 AM  Disclaimer:  Medicine is not an Visual merchandiser. The only guarantee in medicine is that nothing is guaranteed. It is important to note that the decision to proceed with this intervention was based on the information collected from the patient. The Data and conclusions were drawn from the patient's questionnaire, the interview, and the physical examination. Because the information was provided in large part by the patient, it cannot be guaranteed that it has not been purposely or unconsciously manipulated. Every effort has been made to obtain as much relevant data as possible for this evaluation. It is important to note that the conclusions that lead to this procedure are derived in large part from the available data. Always take into account that the treatment will also be dependent on availability of resources and existing treatment guidelines, considered by other Pain Management Practitioners as being common knowledge and practice, at the time of the intervention. For Medico-Legal purposes, it is also important to point out that variation in procedural techniques and pharmacological choices are the acceptable norm. The indications, contraindications, technique, and results of the above procedure should only be interpreted and judged by a Board-Certified Interventional Pain Specialist with extensive familiarity and expertise in the same exact procedure and technique.

## 2024-03-29 NOTE — Patient Instructions (Signed)

## 2024-03-30 ENCOUNTER — Telehealth: Payer: Self-pay

## 2024-03-30 NOTE — Telephone Encounter (Signed)
Post procedure follow up.  Patient states he is doing well 

## 2024-04-05 ENCOUNTER — Encounter: Payer: Self-pay | Admitting: Pain Medicine

## 2024-04-05 ENCOUNTER — Ambulatory Visit: Payer: Medicare (Managed Care) | Attending: Pain Medicine | Admitting: Pain Medicine

## 2024-04-05 VITALS — BP 135/82 | HR 95 | Temp 98.2°F | Resp 16 | Ht 72.0 in | Wt 180.0 lb

## 2024-04-05 DIAGNOSIS — M5459 Other low back pain: Secondary | ICD-10-CM | POA: Insufficient documentation

## 2024-04-05 DIAGNOSIS — M79604 Pain in right leg: Secondary | ICD-10-CM | POA: Diagnosis present

## 2024-04-05 DIAGNOSIS — M79605 Pain in left leg: Secondary | ICD-10-CM | POA: Diagnosis present

## 2024-04-05 DIAGNOSIS — M4310 Spondylolisthesis, site unspecified: Secondary | ICD-10-CM | POA: Insufficient documentation

## 2024-04-05 DIAGNOSIS — M79606 Pain in leg, unspecified: Secondary | ICD-10-CM | POA: Diagnosis present

## 2024-04-05 DIAGNOSIS — Z09 Encounter for follow-up examination after completed treatment for conditions other than malignant neoplasm: Secondary | ICD-10-CM | POA: Diagnosis present

## 2024-04-05 DIAGNOSIS — G8929 Other chronic pain: Secondary | ICD-10-CM | POA: Insufficient documentation

## 2024-04-05 DIAGNOSIS — M545 Low back pain, unspecified: Secondary | ICD-10-CM | POA: Diagnosis present

## 2024-04-05 MED ORDER — PREDNISONE 20 MG PO TABS: ORAL_TABLET | ORAL | 0 refills | Status: AC

## 2024-04-05 NOTE — Progress Notes (Addendum)
 PROVIDER NOTE: Interpretation of information contained herein should be left to medically-trained personnel. Specific patient instructions are provided elsewhere under Patient Instructions section of medical record. This document was created in part using AI and STT-dictation technology, any transcriptional errors that may result from this process are unintentional.  Patient: Caleb Edwards  Service: E/M   PCP: Nola Hong, MD  DOB: Jun 27, 1951  DOS: 04/05/2024  Provider: Eric DELENA Como, MD  MRN: [id number]  Delivery: Face-to-face  Specialty: Interventional Pain Management  Type: Established Patient  Setting: Ambulatory outpatient facility  Specialty designation: 09  Referring Prov.: Nola Hong, MD  Location: Outpatient office facility       History of present illness (HPI) Caleb Edwards, a 73 y.o. year old male, is here today because of his Chronic pain of lower extremity, bilateral [M79.604, M79.605, G89.29]. Caleb Edwards primary complain today is Leg Pain (Upper, lower)  Pertinent problems: Caleb Edwards has Osteoarthritis; Low back pain of over 3 months duration; Lumbar spondylosis; Chronic lower extremity pain (3ry area of Pain) (Bilateral) (R>L); Chronic lumbar radicular pain (Left) (L4 Dermatome); Lumbar facet syndrome (Bilateral) (R>L); Diffuse myofascial pain syndrome; L4-5 disc bulge; Lumbar foraminal stenosis (Bilateral) (L4-5) (L>R); Chronic neck pain; Cervical spondylosis; Chronic pain syndrome; Abdominal pain; Grade 1 Anterolisthesis (7-29mm) of L5 over S1; Lumbar facet arthropathy; Chronic hip pain (2ry area of Pain) (Bilateral) (R>L); Cervical radiculopathy (Left); Cervicalgia; DDD (degenerative disc disease), cervical; Abnormal x-ray of cervical spine; 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); Cervical facet arthropathy (Right: C4-5, C5-6); Grade 1 Anterolisthesis of cervical spine (1-2 mm) (C4/C5); Cervical foraminal stenosis (Right: C5-C8) (Left: C6-C7); Falls, sequela;  Spondylosis without myelopathy or radiculopathy, lumbosacral region; Cervical radiculopathy (Right); Cervical spondylosis with radiculopathy; Abnormal MRI, cervical spine (11/28/2020); 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  Pain Assessment: Severity of Chronic pain is reported as a 10-Worst pain ever/10. Location: Leg Right, Left, Upper, Lower/ . Onset: More than a month ago. Quality: Constant, Other (Comment) (weakness in legs). Timing: Constant. Modifying factor(s): Hydrocodone . Vitals:  height is 6' (1.829 m) and weight is 180 lb (81.6 kg). His temporal temperature is 98.2 F (36.8 C). His blood pressure is 135/82 and his pulse is 95. His respiration is 16 and oxygen saturation is 100%.  BMI: Estimated body mass index is 24.41 kg/m as calculated from the following:   Height as of this encounter: 6' (1.829 m).   Weight as of this encounter: 180 lb (81.6 kg).  Last encounter: 02/23/2024. Last procedure: 03/29/2024.  Reason for encounter: evaluation of worsening, or previously known (established) problem.   Discussed the use of AI scribe software for clinical note transcription with the patient, who gave verbal consent to proceed.  History of Present Illness   Caleb Edwards is a 73 year old male who presents with leg pain and weakness.  He has weakness in the center of his back and pain in both legs, radiating through the buttocks and down the back of the legs. The pain began two days ago and is accompanied by  numbness on the top of the foot. No pain in the big toe.  The pain is persistent and does not worsen with standing or sitting. He has been using Tylenol  and hydrocodone  for pain management, noting that hydrocodone  provides minimal relief. He recently filled his prescription for hydrocodone .  He has a history of similar pain and numbness prior to receiving a shot, indicating a recurring issue. No bowel or bladder incontinence, although he notes increased urination at night without loss of control.  No diabetes and no engagement in unusual activities prior to the onset of symptoms.      Post-Procedure Evaluation   Procedure: Lumbar Facet, Medial Branch Radiofrequency Ablation (RFA) #1  Laterality: Right (-RT)  Level: L2, L3, L4, L5, and S1 Medial Branch Level(s). These levels will denervate the L3-4, L4-5, and L5-S1 lumbar facet joints.  Imaging: Fluoroscopy-guided Spinal ([id number]) Anesthesia: Local anesthesia (1-2% Lidocaine ) Anxiolysis: IV Versed  2.0 mg Sedation: Moderate Sedation Fentanyl  1 mL (50 mcg) DOS: 03/29/2024  Performed by: Eric DELENA Como, MD  Purpose: Therapeutic/Palliative Indications: Low back pain severe enough to impact quality of life or function.  Pain Score: Pre-procedure: 7 /10 Post-procedure: 0-No pain/10    Effectiveness:  Initial hour after procedure: 100 %. Subsequent 4-6 hours post-procedure: 100 %. Analgesia past initial 6 hours:   The patient indicates having done extremely well after the radiofrequency and in fact he is currently not having any low back pain.  However, he is having a sensation of weakness in the lower back with pain that runs down the posterior aspect of both legs all the way down into his foot with a numbing sensation over the lateral and superior aspect of his feet.  He describes the sensation to be equal on both sides..  Pharmacotherapy Assessment   Opioid Analgesic(s):  Tramadol  50 mg, 2 tabs PO q 6 hrs (400 mg/day of tramadol ).  No more chronic opioid analgesics therapy prescribed by our practice. (07/20/2023) UDS: (+) unreported THC (Marijuana) Opioid analgesics stopped. MME/day: 40 mg/day.   Monitoring:  PMP: PDMP not reviewed this encounter.       Pharmacotherapy: No side-effects or adverse reactions reported. Compliance: No problems identified. Effectiveness: Clinically acceptable.  No notes on file  UDS:  Summary  Date Value Ref Range Status  07/20/2023 Note  Final    Comment:    ==================================================================== [employer] Select 13 (MW) ==================================================================== Test                             Result       Flag       Units  Drug Present and Declared for Prescription Verification   Tramadol                        >21739       EXPECTED   ng/mg creat   O-Desmethyltramadol            18430        EXPECTED   ng/mg creat   N-Desmethyltramadol            2035         EXPECTED   ng/mg creat    Source of tramadol  is a prescription medication. O-desmethyltramadol    and N-desmethyltramadol are expected metabolites of tramadol .  Drug Present not Declared for Prescription Verification   Carboxy-THC                    17  UNEXPECTED ng/mg creat    Carboxy-THC is a metabolite of tetrahydrocannabinol (THC). Source of    THC is most commonly herbal marijuana or marijuana-based products,    but THC is also present in a scheduled prescription medication.    Trace amounts of THC can be present in hemp and cannabidiol (CBD)    products. This test is not intended to distinguish between delta-9-    tetrahydrocannabinol, the predominant form of THC in most herbal or    marijuana-based products, and delta-8-tetrahydrocannabinol.  ==================================================================== Test                      Result    Flag   Units      Ref Range   Creatinine              23               mg/dL       >=79 ==================================================================== Declared Medications:  The flagging and interpretation on this report are based on the  following declared medications.  Unexpected results may arise from  inaccuracies in the declared medications.   **Note: The testing scope of this panel includes these medications:   Tramadol  (Ultram )   **Note: The testing scope of this panel does not include the  following reported medications:   Acetaminophen  (Tylenol )  Amlodipine  (Norvasc )  Chlorthalidone  (Hygroton )  Ibuprofen (Advil)  Iron  Lisinopril  (Zestril )  Naloxone  (Narcan )  Pantoprazole  (Protonix )  Rosuvastatin (Crestor)  Sildenafil (Viagra)  Trazodone (Desyrel) ==================================================================== For clinical consultation, please call [phone]. ====================================================================     No results found for: CBDTHCR No results found for: D8THCCBX No results found for: D9THCCBX  ROS  Constitutional: Denies any fever or chills Gastrointestinal: No reported hemesis, hematochezia, vomiting, or acute GI distress Musculoskeletal: Denies any acute onset joint swelling, redness, loss of ROM, or weakness Neurological: No reported episodes of acute onset apraxia, aphasia, dysarthria, agnosia, amnesia, paralysis, loss of coordination, or loss of consciousness  Medication Review  HYDROcodone -acetaminophen , acetaminophen , amLODipine , chlorthalidone , cyclobenzaprine , ferrous sulfate , ibuprofen, lidocaine , lisinopril , naloxone , pantoprazole , predniSONE, rosuvastatin, sildenafil, and traZODone  History Review  Allergy: Caleb Edwards has no known allergies. Drug: Caleb Edwards  reports no history of drug use. Alcohol:  reports current alcohol use of about 1.0 standard drink of alcohol per week. Tobacco:  reports that he has quit smoking. He has been exposed to tobacco smoke. He has never used  smokeless tobacco. Social: Caleb Edwards  reports that he has quit smoking. He has been exposed to tobacco smoke. He has never used smokeless tobacco. He reports current alcohol use of about 1.0 standard drink of alcohol per week. He reports that he does not use drugs. Medical:  has a past medical history of Back pain, Blood transfusion without reported diagnosis (12/2015), Diverticulitis, Hypertension, Kidney stone, and Ulcer, stomach peptic, chronic. Surgical: Caleb Edwards  has a past surgical history that includes Esophagogastroduodenoscopy (egd) with propofol  (N/A, 01/03/2016); Colonoscopy with propofol  (N/A, 08/02/2017); Esophagogastroduodenoscopy (egd) with propofol  (08/02/2017); Esophagogastroduodenoscopy (egd) with propofol  (N/A, 02/05/2018); Colonoscopy with propofol  (N/A, 02/05/2018); Esophagogastroduodenoscopy (egd) with propofol  (N/A, 03/30/2018); Esophagogastroduodenoscopy (egd) with propofol  (N/A, 08/09/2019); Colonoscopy with propofol  (N/A, 08/09/2019); Givens capsule study (N/A, 04/24/2020); Esophagogastroduodenoscopy (egd) with propofol  (N/A, 04/24/2020); and Maxillary antrostomy (Left, 12/10/2022). Family: family history includes Diabetes in his mother; Heart disease in his father; Prostate cancer in his brother.  Laboratory Chemistry Profile   Renal Lab Results  Component Value Date   BUN 14 01/19/2024  CREATININE 1.25 (H) 01/19/2024   GFRAA >60 02/21/2020   GFRNONAA >60 01/19/2024    Hepatic Lab Results  Component Value Date   AST 23 08/09/2019   ALT 15 08/09/2019   ALBUMIN 3.8 08/09/2019   ALKPHOS 53 08/09/2019   LIPASE 17 08/08/2019    Electrolytes Lab Results  Component Value Date   NA 137 01/19/2024   K 3.9 01/19/2024   CL 103 01/19/2024   CALCIUM  9.5 01/19/2024   MG 1.9 01/04/2016    Bone Lab Results  Component Value Date   VD25OH 24.0 (L) 12/26/2015    Inflammation (CRP: Acute Phase) (ESR: Chronic Phase) Lab Results  Component Value Date   CRP 0.6 12/26/2015    ESRSEDRATE 3 12/26/2015         Note: Above Lab results reviewed.  Recent Imaging Review  DG PAIN CLINIC C-ARM 1-60 MIN NO REPORT Fluoro was used, but no Radiologist interpretation will be provided.  Please refer to NOTES tab for provider progress note. Note: Reviewed        Physical Exam  Vitals: BP 135/82 (Cuff Size: Normal)   Pulse 95   Temp 98.2 F (36.8 C) (Temporal)   Resp 16   Ht 6' (1.829 m)   Wt 180 lb (81.6 kg)   SpO2 100%   BMI 24.41 kg/m  BMI: Estimated body mass index is 24.41 kg/m as calculated from the following:   Height as of this encounter: 6' (1.829 m).   Weight as of this encounter: 180 lb (81.6 kg). Ideal: Ideal body weight: 77.6 kg (171 lb 1.2 oz) Adjusted ideal body weight: 79.2 kg (174 lb 10.3 oz) General appearance: Well nourished, well developed, and well hydrated. In no apparent acute distress Mental status: Alert, oriented x 3 (person, place, & time)       Respiratory: No evidence of acute respiratory distress Eyes: PERLA   Assessment   Diagnosis Status  1. Chronic lower extremity pain (3ry area of Pain) (Bilateral) (R>L)   2. Chronic low back pain (Bilateral) w/o sciatica   3. Lumbar facet joint pain   4. Low back pain of over 3 months duration   5. Grade 1 Anterolisthesis (7-51mm) of L5 over S1   6. Postop check   7. Acute pain of lower extremity, unspecified laterality    Controlled Controlled Controlled   Updated Problems: Problem  Acute Pain of Lower Extremity    Plan of Care  Problem-specific:  Assessment and Plan    Bilateral leg pain and numbness   He has experienced bilateral leg pain and numbness for two days, with pain radiating from the buttocks down the back of the legs and numbness on the top of the foot. There is no bowel or bladder incontinence. The pain is persistent and not position-dependent. Previous similar symptoms were managed with radiofrequency ablation and hydrocodone , but symptoms have recurred and  worsened. He does not have diabetes. Current pain management with Tylenol  and hydrocodone  is insufficient. Prescribe oral prednisone to reduce inflammation and manage pain. Ensure hydrocodone  prescription is filled and taken as directed. Schedule a follow-up appointment in 1-2 weeks to assess response to treatment. Consider a steroid injection if symptoms do not improve with current treatment.       Caleb Edwards has a current medication list which includes the following long-term medication(s): chlorthalidone , lisinopril , pantoprazole , rosuvastatin, and trazodone.  Pharmacotherapy (Medications Ordered): Meds ordered this encounter  Medications   predniSONE (DELTASONE) 20 MG tablet  Sig: Take 3 tablets (60 mg total) by mouth daily with breakfast for 3 days, THEN 2 tablets (40 mg total) daily with breakfast for 3 days, THEN 1 tablet (20 mg total) daily with breakfast for 3 days.    Dispense:  18 tablet    Refill:  0   Orders:  Orders Placed This Encounter  Procedures   Nursing Instructions:    Please complete this patient's postprocedure evaluation.    Scheduling Instructions:     Please complete this patient's postprocedure evaluation.     Interventional Therapies  Risk Factors  Considerations:   Stage3 CKD  HTN  Hx GI Bleed  Anemia w/ near syncope   Planned  Pending:   Therapeutic bilateral lumbar facet MBB #3    Under consideration:   Diagnostic/therapeutic L1-2 LESI #1  Diagnostic right IA hip joint injection  Diagnostic bilateral lumbar facet block  Possible bilateral lumbar facet RFA    Completed:   Diagnostic/therapeutic L1-2 LESI x1 (08/18/2023) (80/80/60/60) Diagnostic/therapeutic left L4 TFESI x3 + left L5-S1 LESI x1 (01/06/2023) (100/100/100/95) (LLEP:100  LBP:95)  Therapeutic midline C6-7 interspinous ligament inj. x1 (06/28/2022) (80/90/75/75)  Diagnostic bilateral lumbar facet MBB x2 (12/02/2022) (100/100/0/0) did not help his left leg  pain. Therapeutic left cervical ESI x2 (02/05/2021) (100/100/70 x 10 days/50)  Therapeutic right cervical ESI x1 (12/15/2021) (90/90/90/90)  Therapeutic left L3-4 LESI x2 (06/13/2018) (100/100/85/85)  Therapeutic left L3 TFESI x1 (03/13/2020) (95/95/60/>50)  Therapeutic left L4 TFESI x2 (03/13/2020) (95/95/60/>50)  Therapeutic right L3 TFESI x2 (03/13/2020) (95/95/60/>50)  Therapeutic right L4 TFESI x3 (03/13/2020) (95/95/60/>50)  Referral to neurosurgery Camilla Nine, MD) for cervical instability (11/11/2020)    Therapeutic  Palliative (PRN) options:   Palliative left L3-4 LESI   Palliative left L3 TFESI   Palliative left L4 TFESI   Palliative right L3 TFESI   Palliative right L4 TFESI       Return in about 10 days (around 04/15/2024) for (F2F), (post-Steroid Taper).    Recent Visits Date Type Provider Dept  03/29/24 Procedure visit Tanya Glisson, MD [facility]  03/13/24 Procedure visit Tanya Glisson, MD [facility]  02/23/24 Office Visit Tanya Glisson, MD [facility]  02/09/24 Procedure visit Tanya Glisson, MD [facility]  01/30/24 Office Visit Tanya Glisson, MD [facility]  Showing recent visits within past 90 days and meeting all other requirements Today's Visits Date Type Provider Dept  04/05/24 Office Visit Tanya Glisson, MD [facility]  Showing today's visits and meeting all other requirements Future Appointments Date Type Provider Dept  04/17/24 Appointment Tanya Glisson, MD [facility]  05/10/24 Appointment Tanya Glisson, MD [facility]  Showing future appointments within next 90 days and meeting all other requirements  I discussed the assessment and treatment plan with the patient. The patient was provided an opportunity to ask questions and all were answered. The patient agreed with the plan and demonstrated an understanding of the  instructions.  Patient advised to call back or seek an in-person evaluation if the symptoms or condition worsens.  Duration of encounter: 30 minutes.  Total time on encounter, as per AMA guidelines included both the face-to-face and non-face-to-face time personally spent by the physician and/or other qualified health care professional(s) on the day of the encounter (includes time in activities that require the physician or other qualified health care professional and does not include time in activities normally performed by clinical staff). Physician's time may include the following activities when performed:  Preparing to see the patient (e.g., pre-charting review of records, searching for previously ordered imaging, lab work, and nerve conduction tests) Review of prior analgesic pharmacotherapies. Reviewing PMP Interpreting ordered tests (e.g., lab work, imaging, nerve conduction tests) Performing post-procedure evaluations, including interpretation of diagnostic procedures Obtaining and/or reviewing separately obtained history Performing a medically appropriate examination and/or evaluation Counseling and educating the patient/family/caregiver Ordering medications, tests, or procedures Referring and communicating with other health care professionals (when not separately reported) Documenting clinical information in the electronic or other health record Independently interpreting results (not separately reported) and communicating results to the patient/ family/caregiver Care coordination (not separately reported)  Note by: Eric DELENA Como, MD (TTS and AI technology used. I apologize for any typographical errors that were not detected and corrected.) Date: 04/05/2024; Time: 2:31 PM

## 2024-04-16 NOTE — Progress Notes (Unsigned)
 PROVIDER NOTE: Interpretation of information contained herein should be left to medically-trained personnel. Specific patient instructions are provided elsewhere under Patient Instructions section of medical record. This document was created in part using AI and STT-dictation technology, any transcriptional errors that may result from this process are unintentional.  Patient: Caleb Edwards  Service: E/M   PCP: Nola Hong, MD  DOB: [birth year]  DOS: 04/17/2024  Provider: Eric DELENA Como, MD  MRN: [id number]  Delivery: Face-to-face  Specialty: Interventional Pain Management  Type: Established Patient  Setting: Ambulatory outpatient facility  Specialty designation: 09  Referring Prov.: Nola Hong, MD  Location: Outpatient office facility       History of present illness (HPI) Caleb Edwards, a 73 y.o. year old male, is here today because of his Chronic pain of lower extremity, bilateral [M79.604, M79.605, G89.29]. Caleb Edwards primary complain today is No chief complaint on file.  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; Lumbar facet arthropathy; Chronic hip pain (2ry area of Pain) (Bilateral) (R>L); Cervical radiculopathy (Left); Cervicalgia; DDD (degenerative disc disease), cervical; Abnormal x-ray of cervical spine; 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); Cervical facet arthropathy (Right: C4-5, C5-6); Grade 1 Anterolisthesis of cervical spine (1-2 mm) (C4/C5); Cervical foraminal stenosis (Right: C5-C8) (Left: C6-C7); Falls, sequela;  Spondylosis without myelopathy or radiculopathy, lumbosacral region; Cervical radiculopathy (Right); Cervical spondylosis with radiculopathy; Abnormal MRI, cervical spine (11/28/2020); 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  Pain Assessment: Severity of   is reported as a  /10. Location:    / . Onset:  . Quality:  . Timing:  . Modifying factor(s):  SABRA Vitals:  vitals were not taken for this visit.  BMI: Estimated body mass index is 24.41 kg/m as calculated from the following:   Height as of 04/05/24: 6' (1.829 m).   Weight as of 04/05/24: 180 lb (81.6 kg).  Last encounter: 04/05/2024. Last procedure: 03/29/2024.  Reason for encounter: follow-up evaluation.   Discussed the use of AI scribe software for clinical note transcription with the patient, who gave verbal consent to proceed.  History of Present Illness           Pharmacotherapy Assessment   Opioid Analgesic(s):  Tramadol  50 mg, 2 tabs PO q 6 hrs (400 mg/day of tramadol ). No more chronic opioid analgesics therapy prescribed by our practice. (07/20/2023) UDS: (+) unreported THC (Marijuana) Opioid analgesics stopped. MME/day: 40 mg/day.   Monitoring: [place] PMP: PDMP reviewed during this encounter.       Pharmacotherapy: No side-effects or adverse reactions reported. Compliance: No problems identified. Effectiveness: Clinically acceptable.  No notes on file  UDS:  Summary  Date Value Ref Range Status  07/20/2023 Note  Final    Comment:     ==================================================================== [employer] Select 13 (  MW) ==================================================================== Test                             Result       Flag       Units  Drug Present and Declared for Prescription Verification   Tramadol                        >21739       EXPECTED   ng/mg creat   O-Desmethyltramadol            18430        EXPECTED   ng/mg creat   N-Desmethyltramadol            2035         EXPECTED   ng/mg creat    Source of tramadol  is a prescription medication. O-desmethyltramadol    and N-desmethyltramadol are expected metabolites of tramadol .  Drug Present not Declared for Prescription Verification   Carboxy-THC                    17           UNEXPECTED ng/mg creat    Carboxy-THC is a metabolite of tetrahydrocannabinol (THC). Source of    THC is most commonly herbal marijuana or marijuana-based products,    but THC is also present in a scheduled prescription medication.    Trace amounts of THC can be present in hemp and cannabidiol (CBD)    products. This test is not intended to distinguish between delta-9-    tetrahydrocannabinol, the predominant form of THC in most herbal or    marijuana-based products, and delta-8-tetrahydrocannabinol.  ==================================================================== Test                      Result    Flag   Units      Ref Range   Creatinine              23               mg/dL      >=79 ==================================================================== Declared Medications:  The flagging and interpretation on this report are based on the  following declared medications.  Unexpected results may arise from  inaccuracies in the declared medications.   **Note: The testing scope of this panel includes these medications:   Tramadol  (Ultram )   **Note: The testing scope of this panel does not include the  following reported medications:   Acetaminophen  (Tylenol )   Amlodipine  (Norvasc )  Chlorthalidone  (Hygroton )  Ibuprofen (Advil)  Iron  Lisinopril  (Zestril )  Naloxone  (Narcan )  Pantoprazole  (Protonix )  Rosuvastatin (Crestor)  Sildenafil (Viagra)  Trazodone (Desyrel) ==================================================================== For clinical consultation, please call [phone]. ====================================================================     No results found for: CBDTHCR No results found for: D8THCCBX No results found for: D9THCCBX  ROS  Constitutional: Denies any fever or chills Gastrointestinal: No reported hemesis, hematochezia, vomiting, or acute GI distress Musculoskeletal: Denies any acute onset joint swelling, redness, loss of ROM, or weakness Neurological: No reported episodes of acute onset apraxia, aphasia, dysarthria, agnosia, amnesia, paralysis, loss of coordination, or loss of consciousness  Medication Review  acetaminophen , amLODipine , chlorthalidone , cyclobenzaprine , ferrous sulfate , ibuprofen, lidocaine , lisinopril , naloxone , pantoprazole , rosuvastatin, sildenafil, and traZODone  History Review  Allergy: Caleb Edwards has no known allergies. Drug: Caleb Edwards  reports no history of drug use. Alcohol:  reports current alcohol use of about 1.0  standard drink of alcohol per week. Tobacco:  reports that he has quit smoking. He has been exposed to tobacco smoke. He has never used smokeless tobacco. Social: Caleb Edwards  reports that he has quit smoking. He has been exposed to tobacco smoke. He has never used smokeless tobacco. He reports current alcohol use of about 1.0 standard drink of alcohol per week. He reports that he does not use drugs. Medical:  has a past medical history of Back pain, Blood transfusion without reported diagnosis (12/2015), Diverticulitis, Hypertension, Kidney stone, and Ulcer, stomach peptic, chronic. Surgical: Caleb Edwards  has a past surgical history that includes  Esophagogastroduodenoscopy (egd) with propofol  (N/A, 01/03/2016); Colonoscopy with propofol  (N/A, 08/02/2017); Esophagogastroduodenoscopy (egd) with propofol  (08/02/2017); Esophagogastroduodenoscopy (egd) with propofol  (N/A, 02/05/2018); Colonoscopy with propofol  (N/A, 02/05/2018); Esophagogastroduodenoscopy (egd) with propofol  (N/A, 03/30/2018); Esophagogastroduodenoscopy (egd) with propofol  (N/A, 08/09/2019); Colonoscopy with propofol  (N/A, 08/09/2019); Givens capsule study (N/A, 04/24/2020); Esophagogastroduodenoscopy (egd) with propofol  (N/A, 04/24/2020); and Maxillary antrostomy (Left, 12/10/2022). Family: family history includes Diabetes in his mother; Heart disease in his father; Prostate cancer in his brother.  Laboratory Chemistry Profile   Renal Lab Results  Component Value Date   BUN 14 01/19/2024   CREATININE 1.25 (H) 01/19/2024   GFRAA >60 02/21/2020   GFRNONAA >60 01/19/2024    Hepatic Lab Results  Component Value Date   AST 23 08/09/2019   ALT 15 08/09/2019   ALBUMIN 3.8 08/09/2019   ALKPHOS 53 08/09/2019   LIPASE 17 08/08/2019    Electrolytes Lab Results  Component Value Date   NA 137 01/19/2024   K 3.9 01/19/2024   CL 103 01/19/2024   CALCIUM  9.5 01/19/2024   MG 1.9 01/04/2016    Bone Lab Results  Component Value Date   VD25OH 24.0 (L) 12/26/2015    Inflammation (CRP: Acute Phase) (ESR: Chronic Phase) Lab Results  Component Value Date   CRP 0.6 12/26/2015   ESRSEDRATE 3 12/26/2015         Note: Above Lab results reviewed.  Recent Imaging Review  DG PAIN CLINIC C-ARM 1-60 MIN NO REPORT Fluoro was used, but no Radiologist interpretation will be provided.  Please refer to NOTES tab for provider progress note. Note: Reviewed        Physical Exam  Vitals: There were no vitals taken for this visit. BMI: Estimated body mass index is 24.41 kg/m as calculated from the following:   Height as of 04/05/24: 6' (1.829 m).   Weight as of 04/05/24: 180 lb (81.6 kg). Ideal:  Ideal body weight: 77.6 kg (171 lb 1.2 oz) Adjusted ideal body weight: 79.2 kg (174 lb 10.3 oz) General appearance: Well nourished, well developed, and well hydrated. In no apparent acute distress Mental status: Alert, oriented x 3 (person, place, & time)       Respiratory: No evidence of acute respiratory distress Eyes: PERLA   Assessment   Diagnosis Status  1. Chronic lower extremity pain (3ry area of Pain) (Bilateral) (R>L)   2. Chronic low back pain (Bilateral) w/o sciatica   3. Lumbar facet joint pain   4. Low back pain of over 3 months duration    Controlled Controlled Controlled   Updated Problems: No problems updated.  Plan of Care  Problem-specific:  Assessment and Plan            Caleb Edwards has a current medication list which includes the following long-term medication(s): chlorthalidone , lisinopril , pantoprazole , rosuvastatin, and trazodone.  Pharmacotherapy (Medications Ordered): No orders  of the defined types were placed in this encounter.  Orders:  No orders of the defined types were placed in this encounter.    Interventional Therapies  Risk Factors  Considerations:   Stage3 CKD  HTN  Hx GI Bleed  Anemia w/ near syncope   Planned  Pending:   Therapeutic bilateral lumbar facet MBB #3    Under consideration:   Diagnostic/therapeutic L1-2 LESI #1  Diagnostic right IA hip joint injection  Diagnostic bilateral lumbar facet block  Possible bilateral lumbar facet RFA    Completed:   Diagnostic/therapeutic L1-2 LESI x1 (08/18/2023) (80/80/60/60) Diagnostic/therapeutic left L4 TFESI x3 + left L5-S1 LESI x1 (01/06/2023) (100/100/100/95) (LLEP:100  LBP:95)  Therapeutic midline C6-7 interspinous ligament inj. x1 (06/28/2022) (80/90/75/75)  Diagnostic bilateral lumbar facet MBB x2 (12/02/2022) (100/100/0/0) did not help his left leg pain. Therapeutic left cervical ESI x2 (02/05/2021) (100/100/70 x 10 days/50)  Therapeutic right cervical ESI x1  (12/15/2021) (90/90/90/90)  Therapeutic left L3-4 LESI x2 (06/13/2018) (100/100/85/85)  Therapeutic left L3 TFESI x1 (03/13/2020) (95/95/60/>50)  Therapeutic left L4 TFESI x2 (03/13/2020) (95/95/60/>50)  Therapeutic right L3 TFESI x2 (03/13/2020) (95/95/60/>50)  Therapeutic right L4 TFESI x3 (03/13/2020) (95/95/60/>50)  Referral to neurosurgery Camilla Nine, MD) for cervical instability (11/11/2020)    Therapeutic  Palliative (PRN) options:   Palliative left L3-4 LESI   Palliative left L3 TFESI   Palliative left L4 TFESI   Palliative right L3 TFESI   Palliative right L4 TFESI       No follow-ups on file.    Recent Visits Date Type Provider Dept  04/05/24 Office Visit Tanya Glisson, MD [facility]  03/29/24 Procedure visit Tanya Glisson, MD [facility]  03/13/24 Procedure visit Tanya Glisson, MD [facility]  02/23/24 Office Visit Tanya Glisson, MD [facility]  02/09/24 Procedure visit Tanya Glisson, MD [facility]  01/30/24 Office Visit Tanya Glisson, MD [facility]  Showing recent visits within past 90 days and meeting all other requirements Future Appointments Date Type Provider Dept  04/17/24 Appointment Tanya Glisson, MD [facility]  05/08/24 Appointment Tanya Glisson, MD [facility]  Showing future appointments within next 90 days and meeting all other requirements  I discussed the assessment and treatment plan with the patient. The patient was provided an opportunity to ask questions and all were answered. The patient agreed with the plan and demonstrated an understanding of the instructions.  Patient advised to call back or seek an in-person evaluation if the symptoms or condition worsens.  Duration of encounter: *** minutes.  Total time on encounter, as per AMA guidelines included both the face-to-face and non-face-to-face time personally spent by the  physician and/or other qualified health care professional(s) on the day of the encounter (includes time in activities that require the physician or other qualified health care professional and does not include time in activities normally performed by clinical staff). Physician's time may include the following activities when performed: Preparing to see the patient (e.g., pre-charting review of records, searching for previously ordered imaging, lab work, and nerve conduction tests) Review of prior analgesic pharmacotherapies. Reviewing PMP Interpreting ordered tests (e.g., lab work, imaging, nerve conduction tests) Performing post-procedure evaluations, including interpretation of diagnostic procedures Obtaining and/or reviewing separately obtained history Performing a medically appropriate examination and/or evaluation Counseling and educating the patient/family/caregiver Ordering medications, tests, or procedures Referring and communicating with other health care professionals (when not separately reported) Documenting clinical information in the electronic or other health  record Independently interpreting results (not separately reported) and communicating results to the patient/ family/caregiver Care coordination (not separately reported)  Note by: Eric DELENA Como, MD (TTS and AI technology used. I apologize for any typographical errors that were not detected and corrected.) Date: 04/17/2024; Time: 7:41 AM

## 2024-04-17 ENCOUNTER — Encounter: Payer: Self-pay | Admitting: Pain Medicine

## 2024-04-17 ENCOUNTER — Ambulatory Visit: Payer: Medicare (Managed Care) | Attending: Pain Medicine | Admitting: Pain Medicine

## 2024-04-17 VITALS — BP 131/75 | HR 107 | Temp 97.8°F | Ht 72.0 in | Wt 180.0 lb

## 2024-04-17 DIAGNOSIS — M5459 Other low back pain: Secondary | ICD-10-CM | POA: Insufficient documentation

## 2024-04-17 DIAGNOSIS — M4310 Spondylolisthesis, site unspecified: Secondary | ICD-10-CM | POA: Diagnosis present

## 2024-04-17 DIAGNOSIS — F1290 Cannabis use, unspecified, uncomplicated: Secondary | ICD-10-CM | POA: Insufficient documentation

## 2024-04-17 DIAGNOSIS — M4820 Kissing spine, site unspecified: Secondary | ICD-10-CM | POA: Insufficient documentation

## 2024-04-17 DIAGNOSIS — R937 Abnormal findings on diagnostic imaging of other parts of musculoskeletal system: Secondary | ICD-10-CM | POA: Diagnosis present

## 2024-04-17 DIAGNOSIS — M51372 Other intervertebral disc degeneration, lumbosacral region with discogenic back pain and lower extremity pain: Secondary | ICD-10-CM | POA: Diagnosis not present

## 2024-04-17 DIAGNOSIS — Z79899 Other long term (current) drug therapy: Secondary | ICD-10-CM | POA: Diagnosis present

## 2024-04-17 DIAGNOSIS — M79604 Pain in right leg: Secondary | ICD-10-CM | POA: Diagnosis not present

## 2024-04-17 DIAGNOSIS — R892 Abnormal level of other drugs, medicaments and biological substances in specimens from other organs, systems and tissues: Secondary | ICD-10-CM | POA: Insufficient documentation

## 2024-04-17 DIAGNOSIS — M79605 Pain in left leg: Secondary | ICD-10-CM | POA: Insufficient documentation

## 2024-04-17 DIAGNOSIS — M545 Low back pain, unspecified: Secondary | ICD-10-CM | POA: Diagnosis present

## 2024-04-17 DIAGNOSIS — Z79891 Long term (current) use of opiate analgesic: Secondary | ICD-10-CM | POA: Diagnosis present

## 2024-04-17 DIAGNOSIS — M51369 Other intervertebral disc degeneration, lumbar region without mention of lumbar back pain or lower extremity pain: Secondary | ICD-10-CM | POA: Insufficient documentation

## 2024-04-17 DIAGNOSIS — M48061 Spinal stenosis, lumbar region without neurogenic claudication: Secondary | ICD-10-CM | POA: Diagnosis present

## 2024-04-17 DIAGNOSIS — M4316 Spondylolisthesis, lumbar region: Secondary | ICD-10-CM

## 2024-04-17 DIAGNOSIS — G8929 Other chronic pain: Secondary | ICD-10-CM | POA: Diagnosis present

## 2024-04-17 NOTE — Progress Notes (Signed)
 Safety precautions to be maintained throughout the outpatient stay will include: orient to surroundings, keep bed in low position, maintain call bell within reach at all times, provide assistance with transfer out of bed and ambulation.

## 2024-04-17 NOTE — Patient Instructions (Signed)

## 2024-04-20 LAB — TOXASSURE SELECT 13 (MW), URINE

## 2024-04-21 LAB — DELTA-8 / DELTA-9 THC MTB,MS,U
Carboxy-Delta-8-THC: NOT DETECTED ng/mL
Carboxy-Delta-9-THC: NOT DETECTED ng/mL

## 2024-04-22 LAB — CANNABIDIOL (CBD)/TETRAHYDROCANNABINOL (THC) RATIO, URINE: CBD:THC Ratio: NEGATIVE ratio

## 2024-04-30 ENCOUNTER — Telehealth: Payer: Self-pay

## 2024-04-30 NOTE — Telephone Encounter (Signed)
 Insurance is pending denial of Lesi due to no PT. I called and let him know and he said he is willing to try PT. Can you place the referral? Also he wants to know what the results of his drug test were.

## 2024-04-30 NOTE — Telephone Encounter (Signed)
 The patient called back and said he doesn't want to do physical therapy after all. Please disregard my message.

## 2024-04-30 NOTE — Telephone Encounter (Signed)
 Now he and his wife are asking for you to write a note documenting that physical therapy will not work for him. They are saying because you are not giving him medications and he is not able to do PT,  he needs to have this procedure done. Please advise.

## 2024-05-01 NOTE — Telephone Encounter (Signed)
 His wife told me he tried it a couple of times but it made things worse. There are no PT notes in Epic so he may have done it elsewhere.

## 2024-05-08 ENCOUNTER — Ambulatory Visit: Payer: Medicare (Managed Care) | Attending: Pain Medicine | Admitting: Pain Medicine

## 2024-05-08 ENCOUNTER — Encounter: Payer: Self-pay | Admitting: Pain Medicine

## 2024-05-08 VITALS — BP 160/68 | HR 93 | Temp 97.8°F | Resp 18 | Ht 72.0 in | Wt 165.0 lb

## 2024-05-08 DIAGNOSIS — Z09 Encounter for follow-up examination after completed treatment for conditions other than malignant neoplasm: Secondary | ICD-10-CM | POA: Diagnosis present

## 2024-05-08 DIAGNOSIS — Z79899 Other long term (current) drug therapy: Secondary | ICD-10-CM | POA: Insufficient documentation

## 2024-05-08 DIAGNOSIS — G894 Chronic pain syndrome: Secondary | ICD-10-CM | POA: Insufficient documentation

## 2024-05-08 DIAGNOSIS — M25551 Pain in right hip: Secondary | ICD-10-CM | POA: Diagnosis present

## 2024-05-08 DIAGNOSIS — M79604 Pain in right leg: Secondary | ICD-10-CM | POA: Diagnosis present

## 2024-05-08 DIAGNOSIS — M4820 Kissing spine, site unspecified: Secondary | ICD-10-CM | POA: Diagnosis present

## 2024-05-08 DIAGNOSIS — M25552 Pain in left hip: Secondary | ICD-10-CM | POA: Insufficient documentation

## 2024-05-08 DIAGNOSIS — G8929 Other chronic pain: Secondary | ICD-10-CM | POA: Insufficient documentation

## 2024-05-08 DIAGNOSIS — M79605 Pain in left leg: Secondary | ICD-10-CM | POA: Insufficient documentation

## 2024-05-08 DIAGNOSIS — M545 Low back pain, unspecified: Secondary | ICD-10-CM | POA: Diagnosis present

## 2024-05-08 DIAGNOSIS — M5459 Other low back pain: Secondary | ICD-10-CM | POA: Insufficient documentation

## 2024-05-08 MED ORDER — TRAMADOL HCL 50 MG PO TABS: 100.0000 mg | ORAL_TABLET | Freq: Four times a day (QID) | ORAL | 0 refills | Status: DC | PRN

## 2024-05-08 NOTE — Progress Notes (Signed)
 PROVIDER NOTE: Interpretation of information contained herein should be left to medically-trained personnel. Specific patient instructions are provided elsewhere under Patient Instructions section of medical record. This document was created in part using AI and STT-dictation technology, any transcriptional errors that may result from this process are unintentional.  Patient: Caleb Edwards  Service: E/M   PCP: Nola Hong, MD  DOB: Apr 13, 1951  DOS: 05/08/2024  Provider: Eric DELENA Como, MD  MRN: [id number]  Delivery: Face-to-face  Specialty: Interventional Pain Management  Type: Established Patient  Setting: Ambulatory outpatient facility  Specialty designation: 09  Referring Prov.: Nola Hong, MD  Location: Outpatient office facility       History of present illness (HPI) Mr. DELON REVELO, a 73 y.o. year old male, is here today because of his Chronic pain of lower extremity, bilateral [M79.604, M79.605, G89.29]. Mr. Gettis primary complain today is Back Pain  Pertinent problems: Mr. 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) (L>R); Chronic neck pain; Cervical spondylosis; Chronic pain syndrome; Abdominal pain; Grade 1 Anterolisthesis (7-72mm) of L5 over S1; Lumbar facet arthropathy; Chronic hip pain (2ry area of Pain) (Bilateral) (R>L); Cervical radiculopathy (Left); Cervicalgia; DDD (degenerative disc disease), cervical; Abnormal x-ray of cervical spine; 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); Cervical facet arthropathy (Right: C4-5, C5-6); Grade 1 Anterolisthesis of cervical spine (1-2 mm) (C4/C5); Cervical foraminal stenosis (Right: C5-C8) (Left: C6-C7); Falls, sequela; Spondylosis without  myelopathy or radiculopathy, lumbosacral region; Cervical radiculopathy (Right); Cervical spondylosis with radiculopathy; Abnormal MRI, cervical spine (11/28/2020); 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  Pain Assessment: Severity of Chronic pain is reported as a 9 /10. Location: Back Lower/radiates down both legs to feet in the back. Onset: More than a month ago. Quality: Aching, Constant, Throbbing, Tingling. Timing: Constant. Modifying factor(s): meds, heat. Vitals:  height is 6' (1.829 m) and weight is 165 lb (74.8 kg). His temperature is 97.8 F (36.6 C). His blood pressure is 160/68 (abnormal) and his pulse is 93. His respiration is 18 and oxygen saturation is 99%.  BMI: Estimated body mass index is 22.38 kg/m as calculated from the following:   Height as of this encounter: 6' (1.829 m).   Weight as of this encounter: 165 lb (74.8 kg).  Last encounter: 04/17/2024. Last procedure: 03/29/2024.  Reason for encounter: patient-requested evaluation.   Discussed the use of AI scribe software for clinical note transcription with the patient, who gave verbal consent to proceed.  History of Present Illness   DARRYLE DENNIE is a 73 year old male who presents for a follow-up regarding pain management and medication compliance.  He is here to discuss the results of his recent urine drug screen test, which came back negative for illegal substances but  showed the presence of opioids, consistent with his prescribed pain management regimen.  He is seeking to have his  pain medication prescription reinstated after a previous issue with a positive drug screen.       Pharmacotherapy Assessment   Opioid Analgesic(s):  Tramadol  50 mg, 2 tabs PO q 6 hrs (400 mg/day of tramadol ). No more chronic opioid analgesics therapy prescribed by our practice. (07/20/2023) UDS: (+) unreported THC (Marijuana) Opioid analgesics stopped. MME/day: 40 mg/day.   Monitoring: [place] PMP: PDMP reviewed during this encounter.       Pharmacotherapy: No side-effects or adverse reactions reported. Compliance: No problems identified. Effectiveness: Clinically acceptable.  Rebecka Wolm HERO, RN  05/08/2024  1:40 PM  Sign when Signing Visit Safety precautions to be maintained throughout the outpatient stay will include: orient to surroundings, keep bed in low position, maintain call bell within reach at all times, provide assistance with transfer out of bed and ambulation.    UDS:  Summary  Date Value Ref Range Status  04/17/2024 FINAL  Final    Comment:    ==================================================================== [employer] [facility] (MW) ==================================================================== Test                             Result       Flag       Units  Drug Present not Declared for Prescription Verification   Hydrocodone                     803          UNEXPECTED ng/mg creat   Hydromorphone                  181          UNEXPECTED ng/mg creat   Dihydrocodeine                 60           UNEXPECTED ng/mg creat   Norhydrocodone                 438          UNEXPECTED ng/mg creat    Sources of hydrocodone  include scheduled prescription medications.    Hydromorphone, dihydrocodeine and norhydrocodone are expected    metabolites of hydrocodone . Hydromorphone and dihydrocodeine are    also available as scheduled prescription medications.    Oxycodone                        253          UNEXPECTED ng/mg creat   Oxymorphone                    270          UNEXPECTED ng/mg creat   Noroxycodone                   606          UNEXPECTED ng/mg creat   Noroxymorphone                 179          UNEXPECTED ng/mg creat    Sources of oxycodone  are scheduled prescription medications.    Oxymorphone, noroxycodone, and noroxymorphone are expected    metabolites of oxycodone . Oxymorphone is also available as a    scheduled prescription medication.  ==================================================================== Test                      Result  Flag   Units      Ref Range   Creatinine              97               mg/dL      >=79 ==================================================================== Declared Medications:  The flagging and interpretation on this report are based on the  following declared medications.  Unexpected results may arise from  inaccuracies in the declared medications.   **Note: The testing scope of this panel does not include the  following reported medications:   Acetaminophen  (Tylenol )  Amlodipine  (Norvasc )  Chlorthalidone  (Hygroton )  Cyclobenzaprine  (Flexeril )  Ibuprofen (Advil)  Iron  Lisinopril  (Zestril )  Naloxone  (Narcan )  Pantoprazole  (Protonix )  Rosuvastatin (Crestor)  Sildenafil (Viagra)  Topical Lidocaine  (Lidoderm )  Trazodone (Desyrel) ==================================================================== For clinical consultation, please call [phone]. ====================================================================     CBD:THC Ratio  Date Value Ref Range Status  04/17/2024 Negative RATIO Final    Comment:    INTERPRETATION: Neither CBD nor marijuana or marijuana/THC products detected in this sample.  This test measures Cannabidiol  (CBD) and Tetrahydrocannabinol (THC) and metabolites in urine. The CBD:THC ratio is calculated using the sums of the respective metabolites and is  intended to assist in differentiating the presence of Tetrahydrocannabinol [facility]) metabolites due to the use of marijuana or medicinal THC from the presence of THC metabolites due to use of Cannabidiol  (CBD) or hemp products that purportedly contain trace amounts of THC.  CBD:THC Ratio          Interpretation ---------      --------------------------------------------- >=10.0         Consistent with the use of CBD products only 1.0 - 9.9      Indeterminate <1.0           Consistent with marijuana, medicinal THC or                mixed use  Interpretive ranges are provided as guidance and should not be considered definitive. Interpretation of results should include consideration of all relevant clinical and diagnostic information.  Analysis performed by chromatography with mass spectrometry. This test was developed and its performance characteristics determined by [facility].  It has not been cleared or approved by the Food and Drug Administration.    Carboxy-Delta-8-THC  Date Value Ref Range Status  04/17/2024 Not Detected ng/mL Final   Carboxy-Delta-9-THC  Date Value Ref Range Status  04/17/2024 Not Detected ng/mL Final    Comment:    Carboxy-Delta-9-THC is the primary metabolite of Delta-9- Tetrahydrocannabinol. Sources include the prescription medication Dronabinol as well as illicit, recreational, and medical marijuana and marijuana derived products of the same categories.  Carboxy-Delta-8-THC is the primary metabolite of Delta-8- Tetrahydrocannabinol. Sources are products containing Delta- 8-THC, which is primarily chemically manufactured from cannabidiol  (CBD).  Testing Threshold = 2.0 ng/mL  Analysis performed by Liquid Chromatography with Tandem Mass Spectrometry (LC/MS/MS).  This test was developed and its performance characteristics determined by [facility].  It has not been cleared or approved by the Food and Drug Administration.     ROS  Constitutional:  Denies any fever or chills Gastrointestinal: No reported hemesis, hematochezia, vomiting, or acute GI distress Musculoskeletal: Denies any acute onset joint swelling, redness, loss of ROM, or weakness Neurological: No reported episodes of acute onset apraxia, aphasia, dysarthria, agnosia, amnesia, paralysis, loss of coordination, or loss of consciousness  Medication Review  acetaminophen , amLODipine , chlorthalidone , cyclobenzaprine , ferrous sulfate , ibuprofen, lidocaine , lisinopril , naloxone , pantoprazole , rosuvastatin, sildenafil, traMADol , and  traZODone  History Review  Allergy: Mr. Chhim has no known allergies. Drug: Mr. Chrismer  reports no history of drug use. Alcohol:  reports current alcohol use of about 1.0 standard drink of alcohol per week. Tobacco:  reports that he has quit smoking. He has been exposed to tobacco smoke. He has never used smokeless tobacco. Social: Mr. Toya  reports that he has quit smoking. He has been exposed to tobacco smoke. He has never used smokeless tobacco. He reports current alcohol use of about 1.0 standard drink of alcohol per week. He reports that he does not use drugs. Medical:  has a past medical history of Back pain, Blood transfusion without reported diagnosis (12/2015), Diverticulitis, Hypertension, Kidney stone, and Ulcer, stomach peptic, chronic. Surgical: Mr. Borton  has a past surgical history that includes Esophagogastroduodenoscopy (egd) with propofol  (N/A, 01/03/2016); Colonoscopy with propofol  (N/A, 08/02/2017); Esophagogastroduodenoscopy (egd) with propofol  (08/02/2017); Esophagogastroduodenoscopy (egd) with propofol  (N/A, 02/05/2018); Colonoscopy with propofol  (N/A, 02/05/2018); Esophagogastroduodenoscopy (egd) with propofol  (N/A, 03/30/2018); Esophagogastroduodenoscopy (egd) with propofol  (N/A, 08/09/2019); Colonoscopy with propofol  (N/A, 08/09/2019); Givens capsule study (N/A, 04/24/2020); Esophagogastroduodenoscopy (egd) with propofol  (N/A, 04/24/2020);  and Maxillary antrostomy (Left, 12/10/2022). Family: family history includes Diabetes in his mother; Heart disease in his father; Prostate cancer in his brother.  Laboratory Chemistry Profile   Renal Lab Results  Component Value Date   BUN 14 01/19/2024   CREATININE 1.25 (H) 01/19/2024   GFRAA >60 02/21/2020   GFRNONAA >60 01/19/2024    Hepatic Lab Results  Component Value Date   AST 23 08/09/2019   ALT 15 08/09/2019   ALBUMIN 3.8 08/09/2019   ALKPHOS 53 08/09/2019   LIPASE 17 08/08/2019    Electrolytes Lab Results  Component Value Date   NA 137 01/19/2024   K 3.9 01/19/2024   CL 103 01/19/2024   CALCIUM  9.5 01/19/2024   MG 1.9 01/04/2016    Bone Lab Results  Component Value Date   VD25OH 24.0 (L) 12/26/2015    Inflammation (CRP: Acute Phase) (ESR: Chronic Phase) Lab Results  Component Value Date   CRP 0.6 12/26/2015   ESRSEDRATE 3 12/26/2015         Note: Above Lab results reviewed.  Recent Imaging Review  DG PAIN CLINIC C-ARM 1-60 MIN NO REPORT Fluoro was used, but no Radiologist interpretation will be provided.  Please refer to NOTES tab for provider progress note. Note: Reviewed        Physical Exam  Vitals: BP (!) 160/68   Pulse 93   Temp 97.8 F (36.6 C)   Resp 18   Ht 6' (1.829 m)   Wt 165 lb (74.8 kg)   SpO2 99%   BMI 22.38 kg/m  BMI: Estimated body mass index is 22.38 kg/m as calculated from the following:   Height as of this encounter: 6' (1.829 m).   Weight as of this encounter: 165 lb (74.8 kg). Ideal: Ideal body weight: 77.6 kg (171 lb 1.2 oz) General appearance: Well nourished, well developed, and well hydrated. In no apparent acute distress Mental status: Alert, oriented x 3 (person, place, & time)       Respiratory: No evidence of acute respiratory distress Eyes: PERLA   Assessment   Diagnosis Status  1. Chronic lower extremity pain (3ry area of Pain) (Bilateral) (R>L)   2. Chronic low back pain (Bilateral) w/o sciatica   3.  Lumbar facet joint pain   4. Multifactorial low back pain   5. Low back pain of over 3  months duration   6. Kissing spine syndrome   7. Postop check   8. Chronic hip pain (2ry area of Pain) (Bilateral) (R>L)   9. Low back pain radiating to left leg   10. Pharmacologic therapy   11. Chronic pain syndrome    Controlled Controlled Controlled   Updated Problems: No problems updated.  Plan of Care  Problem-specific:  Assessment and Plan    Chronic pain requiring opioid therapy   Chronic pain is managed with opioid therapy. Recent urine drug screen was negative for illegal substances but confirmed opioid presence. He receives a second chance with a strict warning about future compliance. Non-compliance will result in permanent discharge from the clinic. Prescribe a one-month supply of pain medication. Schedule a follow-up appointment for retesting.       Mr. RICKEY SADOWSKI has a current medication list which includes the following long-term medication(s): chlorthalidone , lisinopril , pantoprazole , rosuvastatin, tramadol , and trazodone.  Pharmacotherapy (Medications Ordered): Meds ordered this encounter  Medications   traMADol  (ULTRAM ) 50 MG tablet    Sig: Take 2 tablets (100 mg total) by mouth every 6 (six) hours as needed.    Dispense:  240 tablet    Refill:  0   Orders:  Orders Placed This Encounter  Procedures   Ambulatory referral to Physical Therapy    Referral Priority:   Routine    Referral Type:   Physical Medicine    Referral Reason:   Specialty Services Required    Requested Specialty:   Physical Therapy    Number of Visits Requested:   1   Nursing Instructions:    Please complete this patient's postprocedure evaluation.    Scheduling Instructions:     Please complete this patient's postprocedure evaluation.     Interventional Therapies  Risk Factors  Considerations:   Stage3 CKD  HTN  Hx GI Bleed  Anemia w/ near syncope           Planned  Pending:    Physical therapy ordered today (05/08/2024)  Therapeutic left L5-S1 LESI #2 + right L4 TFESI #4    Under consideration:   Therapeutic left L5-S1 LESI #2 + right L4 TFESI #4    Completed:   Therapeutic right L2-S1 medial branch lumbar facet RFA x1 (03/29/2024) (100/100/100/sensation of weakness in both legs)  Therapeutic left L2-S1 medial branch lumbar facet RFA x1 (03/13/2024) (100/100/50/50)  Therapeutic right L2-3 LESI x1 (08/18/2023) (80/80/60/60)  Diagnostic/therapeutic left L4 TFESI x4 + left L5-S1 LESI x1 (01/06/2023) (100/100/100/95) (LLEP:100  LBP:95)  Therapeutic midline C6-7 interspinous ligament inj. x1 (06/28/2022) (80/90/75/75)  Diagnostic bilateral lumbar facet MBB x3 (02/09/2024) (100/100/60 x 7 days/40)  Therapeutic left cervical ESI x2 (02/05/2021) (100/100/70 x 10 days/50)  Therapeutic right cervical ESI x1 (12/15/2021) (90/90/90/90)  Therapeutic left L3-4 LESI x2 (06/13/2018) (100/100/85/85)  Therapeutic left L3 TFESI x1 (03/13/2020) (95/95/60/>50)  Therapeutic left L4 TFESI x4 (01/06/2023) (100/100/100/95)  Therapeutic right L3 TFESI x2 (03/13/2020) (95/95/60/>50)  Therapeutic right L4 TFESI x3 (03/13/2020) (95/95/60/>50)  Referral to neurosurgery Camilla Nine, MD) for cervical instability (11/11/2020)    Therapeutic  Palliative (PRN) options:   Evaluation needed prior to procedures.     Return in about 30 days (around 06/07/2024) for Eval-day (M,W), (Face2F), (MM).    Recent Visits Date Type Provider Dept  04/17/24 Office Visit Tanya Glisson, MD [facility]  04/05/24 Office Visit Tanya Glisson, MD [facility]  03/29/24 Procedure visit Tanya Glisson, MD [facility]  03/13/24 Procedure visit Tanya,  Eric, MD [facility]  02/23/24 Office Visit Tanya Eric, MD [facility]  02/09/24 Procedure visit Tanya Eric, MD [facility]  Showing recent visits within past 90 days and  meeting all other requirements Today's Visits Date Type Provider Dept  05/08/24 Office Visit Tanya Eric, MD [facility]  Showing today's visits and meeting all other requirements Future Appointments No visits were found meeting these conditions. Showing future appointments within next 90 days and meeting all other requirements  I discussed the assessment and treatment plan with the patient. The patient was provided an opportunity to ask questions and all were answered. The patient agreed with the plan and demonstrated an understanding of the instructions.  Patient advised to call back or seek an in-person evaluation if the symptoms or condition worsens.  Duration of encounter: 30 minutes.  Total time on encounter, as per AMA guidelines included both the face-to-face and non-face-to-face time personally spent by the physician and/or other qualified health care professional(s) on the day of the encounter (includes time in activities that require the physician or other qualified health care professional and does not include time in activities normally performed by clinical staff). Physician's time may include the following activities when performed: Preparing to see the patient (e.g., pre-charting review of records, searching for previously ordered imaging, lab work, and nerve conduction tests) Review of prior analgesic pharmacotherapies. Reviewing PMP Interpreting ordered tests (e.g., lab work, imaging, nerve conduction tests) Performing post-procedure evaluations, including interpretation of diagnostic procedures Obtaining and/or reviewing separately obtained history Performing a medically appropriate examination and/or evaluation Counseling and educating the patient/family/caregiver Ordering medications, tests, or procedures Referring and communicating with other health care professionals (when not separately reported) Documenting clinical information in the electronic or  other health record Independently interpreting results (not separately reported) and communicating results to the patient/ family/caregiver Care coordination (not separately reported)  Note by: Eric DELENA Tanya, MD (TTS and AI technology used. I apologize for any typographical errors that were not detected and corrected.) Date: 05/08/2024; Time: 2:29 PM

## 2024-05-08 NOTE — Patient Instructions (Signed)
 ______________________________________________________________________    Opioid Pain Medication Update  To: All patients taking opioid pain medications. (I.e.: hydrocodone, hydromorphone, oxycodone, oxymorphone, morphine, codeine, methadone, tapentadol, tramadol, buprenorphine, fentanyl, etc.)  Re: Updated review of side effects and adverse reactions of opioid analgesics, as well as new information about long term effects of this class of medications.  Direct risks of long-term opioid therapy are not limited to opioid addiction and overdose. Potential medical risks include serious fractures, breathing problems during sleep, hyperalgesia, immunosuppression, chronic constipation, bowel obstruction, myocardial infarction, and tooth decay secondary to xerostomia.  Unpredictable adverse effects that can occur even if you take your medication correctly: Cognitive impairment, respiratory depression, and death. Most people think that if they take their medication "correctly", and "as instructed", that they will be safe. Nothing could be farther from the truth. In reality, a significant amount of recorded deaths associated with the use of opioids has occurred in individuals that had taken the medication for a long time, and were taking their medication correctly. The following are examples of how this can happen: Patient taking his/her medication for a long time, as instructed, without any side effects, is given a certain antibiotic or another unrelated medication, which in turn triggers a "Drug-to-drug interaction" leading to disorientation, cognitive impairment, impaired reflexes, respiratory depression or an untoward event leading to serious bodily harm or injury, including death.  Patient taking his/her medication for a long time, as instructed, without any side effects, develops an acute impairment of liver and/or kidney function. This will lead to a rapid inability of the body to breakdown and eliminate  their pain medication, which will result in effects similar to an "overdose", but with the same medicine and dose that they had always taken. This again may lead to disorientation, cognitive impairment, impaired reflexes, respiratory depression or an untoward event leading to serious bodily harm or injury, including death.  A similar problem will occur with patients as they grow older and their liver and kidney function begins to decrease as part of the aging process.  Background information: Historically, the original case for using long-term opioid therapy to treat chronic noncancer pain was based on safety assumptions that subsequent experience has called into question. In 1996, the American Pain Society and the American Academy of Pain Medicine issued a consensus statement supporting long-term opioid therapy. This statement acknowledged the dangers of opioid prescribing but concluded that the risk for addiction was low; respiratory depression induced by opioids was short-lived, occurred mainly in opioid-naive patients, and was antagonized by pain; tolerance was not a common problem; and efforts to control diversion should not constrain opioid prescribing. This has now proven to be wrong. Experience regarding the risks for opioid addiction, misuse, and overdose in community practice has failed to support these assumptions.  According to the Centers for Disease Control and Prevention, fatal overdoses involving opioid analgesics have increased sharply over the past decade. Currently, more than 96,700 people die from drug overdoses every year. Opioids are a factor in 7 out of every 10 overdose deaths. Deaths from drug overdose have surpassed motor vehicle accidents as the leading cause of death for individuals between the ages of 61 and 64.  Clinical data suggest that neuroendocrine dysfunction may be very common in both men and women, potentially causing hypogonadism, erectile dysfunction, infertility,  decreased libido, osteoporosis, and depression. Recent studies linked higher opioid dose to increased opioid-related mortality. Controlled observational studies reported that long-term opioid therapy may be associated with increased risk for cardiovascular events. Subsequent  meta-analysis concluded that the safety of long-term opioid therapy in elderly patients has not been proven.   Side Effects and adverse reactions: Common side effects: Drowsiness (sedation). Dizziness. Nausea and vomiting. Constipation. Physical dependence -- Dependence often manifests with withdrawal symptoms when opioids are discontinued or decreased. Tolerance -- As you take repeated doses of opioids, you require increased medication to experience the same effect of pain relief. Respiratory depression -- This can occur in healthy people, especially with higher doses. However, people with COPD, asthma or other lung conditions may be even more susceptible to fatal respiratory impairment.  Uncommon side effects: An increased sensitivity to feeling pain and extreme response to pain (hyperalgesia). Chronic use of opioids can lead to this. Delayed gastric emptying (the process by which the contents of your stomach are moved into your small intestine). Muscle rigidity. Immune system and hormonal dysfunction. Quick, involuntary muscle jerks (myoclonus). Arrhythmia. Itchy skin (pruritus). Dry mouth (xerostomia).  Long-term side effects: Chronic constipation. Sleep-disordered breathing (SDB). Increased risk of bone fractures. Hypothalamic-pituitary-adrenal dysregulation. Increased risk of overdose.  RISKS: Respiratory depression and death: Opioids increase the risk of respiratory depression and death.  Drug-to-drug interactions: Opioids are relatively contraindicated in combination with benzodiazepines, sleep inducers, and other central nervous system depressants. Other classes of medications (i.e.: certain antibiotics  and even over-the-counter medications) may also trigger or induce respiratory depression in some patients.  Medical conditions: Patients with pre-existing respiratory problems are at higher risk of respiratory failure and/or depression when in combination with opioid analgesics. Opioids are relatively contraindicated in some medical conditions such as central sleep apnea.   Fractures and Falls:  Opioids increase the risk and incidence of falls. This is of particular importance in elderly patients.  Endocrine System:  Long-term administration is associated with endocrine abnormalities (endocrinopathies). (Also known as Opioid-induced Endocrinopathy) Influences on both the hypothalamic-pituitary-adrenal axis?and the hypothalamic-pituitary-gonadal axis have been demonstrated with consequent hypogonadism and adrenal insufficiency in both sexes. Hypogonadism and decreased levels of dehydroepiandrosterone sulfate have been reported in men and women. Endocrine effects include: Amenorrhoea in women (abnormal absence of menstruation) Reduced libido in both sexes Decreased sexual function Erectile dysfunction in men Hypogonadisms (decreased testicular function with shrinkage of testicles) Infertility Depression and fatigue Loss of muscle mass Anxiety Depression Immune suppression Hyperalgesia Weight gain Anemia Osteoporosis Patients (particularly women of childbearing age) should avoid opioids. There is insufficient evidence to recommend routine monitoring of asymptomatic patients taking opioids in the long-term for hormonal deficiencies.  Immune System: Human studies have demonstrated that opioids have an immunomodulating effect. These effects are mediated via opioid receptors both on immune effector cells and in the central nervous system. Opioids have been demonstrated to have adverse effects on antimicrobial response and anti-tumour surveillance. Buprenorphine has been demonstrated to have  no impact on immune function.  Opioid Induced Hyperalgesia: Human studies have demonstrated that prolonged use of opioids can lead to a state of abnormal pain sensitivity, sometimes called opioid induced hyperalgesia (OIH). Opioid induced hyperalgesia is not usually seen in the absence of tolerance to opioid analgesia. Clinically, hyperalgesia may be diagnosed if the patient on long-term opioid therapy presents with increased pain. This might be qualitatively and anatomically distinct from pain related to disease progression or to breakthrough pain resulting from development of opioid tolerance. Pain associated with hyperalgesia tends to be more diffuse than the pre-existing pain and less defined in quality. Management of opioid induced hyperalgesia requires opioid dose reduction.  Cancer: Chronic opioid therapy has been associated with an increased risk  of cancer among noncancer patients with chronic pain. This association was more evident in chronic strong opioid users. Chronic opioid consumption causes significant pathological changes in the small intestine and colon. Epidemiological studies have found that there is a link between opium dependence and initiation of gastrointestinal cancers. Cancer is the second leading cause of death after cardiovascular disease. Chronic use of opioids can cause multiple conditions such as GERD, immunosuppression and renal damage as well as carcinogenic effects, which are associated with the incidence of cancers.   Mortality: Long-term opioid use has been associated with increased mortality among patients with chronic non-cancer pain (CNCP).  Prescription of long-acting opioids for chronic noncancer pain was associated with a significantly increased risk of all-cause mortality, including deaths from causes other than overdose.  Reference: Von Korff M, Kolodny A, Deyo RA, Chou R. Long-term opioid therapy reconsidered. Ann Intern Med. 2011 Sep 6;155(5):325-8. doi:  10.7326/[id number]. PMID: 16109604; PMCID: [id number]. Randon Goldsmith, Hayward RA, Dunn KM, [place] KP. Risk of adverse events in patients prescribed long-term opioids: A cohort study in the [place] Clinical Practice Research Datalink. Eur J Pain. 2019 May;23(5):908-922. doi: 10.1002/ejp.1357. Epub 2019 Jan 31. PMID: 78295621. Colameco S, Coren JS, Ciervo CA. Continuous opioid treatment for chronic noncancer pain: a time for moderation in prescribing. Postgrad Med. 2009 Jul;121(4):61-6. doi: 10.3810/pgm.2009.07.2032. PMID: 30865784. William Hamburger RN, [place] SD, Blazina I, Cristopher Peru, Bougatsos C, Deyo RA. The effectiveness and risks of long-term opioid therapy for chronic pain: a systematic review for a [employer] of [employer] Pathways to [employer]. Ann Intern Med. 2015 Feb 17;162(4):276-86. doi: 10.7326/M14-2559. PMID: 69629528. Caryl Bis [facility], Makuc DM. NCHS Data Brief No. 22. [place]: Centers for Disease Control and Prevention; 2009. Sep, Increase in Fatal Poisonings Involving Opioid Analgesics in the [place], 1999-2006. Song IA, Choi HR, Oh TK. Long-term opioid use and mortality in patients with chronic non-cancer pain: Ten-year follow-up study in [place] from 2010 through 2019. EClinicalMedicine. 2022 Jul 18;51:101558. doi: 10.1016/j.eclinm.2022.413244. PMID: 01027253; PMCID: [id number]. Huser, W., Schubert, T., Vogelmann, T. et al. All-cause mortality in patients with long-term opioid therapy compared with non-opioid analgesics for chronic non-cancer pain: a database study. BMC Med 18, 162 (2020). http://lester.info/ Rashidian H, Karie Kirks, Malekzadeh R, Haghdoost AA. An Ecological Study of the Association between Opiate Use and Incidence of Cancers. Addict Health. 2016 Fall;8(4):252-260. PMID: 59563875; PMCID: [id number].  Our Goal: Our goal is to control your pain with means other  than the use of opioid pain medications.  Our Recommendation: Talk to your physician about coming off of these medications. We can assist you with the tapering down and stopping these medicines. Based on the new information, even if you cannot completely stop the medication, a decrease in the dose may be associated with a lesser risk. Ask for other means of controlling the pain. Decrease or eliminate those factors that significantly contribute to your pain such as smoking, obesity, and a diet heavily tilted towards "inflammatory" nutrients.  Last Updated: 04/11/2023   ______________________________________________________________________       ______________________________________________________________________    National Pain Medication Shortage  The [place] is experiencing worsening drug shortages. These have had a negative widespread effect on patient care and treatment. Not expected to improve any time soon. Predicted to last past 2029.   Drug shortage list (generic names) Oxycodone IR Oxycodone/APAP Oxymorphone IR Hydromorphone Hydrocodone/APAP Morphine  Where is the problem?  Manufacturing and supply level.  Will this shortage  affect you?  Only if you take any of the above pain medications.  How? You may be unable to fill your prescription.  Your pharmacist may offer a "partial fill" of your prescription. (Warning: Do not accept partial fills.) Prescriptions partially filled cannot be transferred to another pharmacy. Read our Medication Rules and Regulation. Depending on how much medicine you are dependent on, you may experience withdrawals when unable to get the medication.  Recommendations: Consider ending your dependence on opioid pain medications. Ask your pain specialist to assist you with the process. Consider switching to a medication currently not in shortage, such as Buprenorphine. Talk to your pain specialist about this option. Consider decreasing your pain  medication requirements by managing tolerance thru "Drug Holidays". This may help minimize withdrawals, should you run out of medicine. Control your pain thru the use of non-pharmacological interventional therapies.   Your prescriber: Prescribers cannot be blamed for shortages. Medication manufacturing and supply issues cannot be fixed by the prescriber.   NOTE: The prescriber is not responsible for supplying the medication, or solving supply issues. Work with your pharmacist to solve it. The patient is responsible for the decision to take or continue taking the medication and for identifying and securing a legal supply source. By law, supplying the medication is the job and responsibility of the pharmacy. The prescriber is responsible for the evaluation, monitoring, and prescribing of these medications.   Prescribers will NOT: Re-issue prescriptions that have been partially filled. Re-issue prescriptions already sent to a pharmacy.  Re-send prescriptions to a different pharmacy because yours did not have your medication. Ask pharmacist to order more medicine or transfer the prescription to another pharmacy. (Read below.)  New 2023 regulation: "June 04, 2022 Revised Regulation Allows DEA-Registered Pharmacies to Transfer Electronic Prescriptions at a Patient's Request DEA Headquarters Division - Public Information Office Patients now have the ability to request their electronic prescription be transferred to another pharmacy without having to go back to their practitioner to initiate the request. This revised regulation went into effect on Monday, May 31, 2022.     At a patient's request, a DEA-registered retail pharmacy can now transfer an electronic prescription for a controlled substance (schedules II-V) to another DEA-registered retail pharmacy. Prior to this change, patients would have to go through their practitioner to cancel their prescription and have it re-issued to a different  pharmacy. The process was taxing and time consuming for both patients and practitioners.    The Drug Enforcement Administration [facility]) published its intent to revise the process for transferring electronic prescriptions on August 22, 2020.  The final rule was published in the federal register on April 29, 2022 and went into effect 30 days later.  Under the final rule, a prescription can only be transferred once between pharmacies, and only if allowed under existing state or other applicable law. The prescription must remain in its electronic form; may not be altered in any way; and the transfer must be communicated directly between two licensed pharmacists. It's important to note, any authorized refills transfer with the original prescription, which means the entire prescription will be filled at the same pharmacy".  Reference: HugeHand.is [facility] website announcement)  [email or web address] [employer] of [employer])   [employer] / Vol. 88, No. 143 / Thursday, April 29, 2022 / Rules and Regulations DEPARTMENT OF JUSTICE  Drug Enforcement Administration  21 CFR Part 1306  [Docket No. [id number]]  RIN [id number] Transfer of Electronic Prescriptions for Schedules II-V Controlled Substances Between  Pharmacies for Initial Filling  ______________________________________________________________________       ______________________________________________________________________    Transfer of Pain Medication between Pharmacies  Re: 2023 DEA Clarification on existing regulation  Published on DEA Website: June 04, 2022  Title: Revised Regulation Allows DEA-Registered Pharmacies to Electrical engineer Prescriptions at a Patient's Request DEA Headquarters Division - [employer]  "Patients now have the ability to  request their electronic prescription be transferred to another pharmacy without having to go back to their practitioner to initiate the request. This revised regulation went into effect on Monday, May 31, 2022.     At a patient's request, a DEA-registered retail pharmacy can now transfer an electronic prescription for a controlled substance (schedules II-V) to another DEA-registered retail pharmacy. Prior to this change, patients would have to go through their practitioner to cancel their prescription and have it re-issued to a different pharmacy. The process was taxing and time consuming for both patients and practitioners.    The Drug Enforcement Administration [facility]) published its intent to revise the process for transferring electronic prescriptions on August 22, 2020.  The final rule was published in the federal register on April 29, 2022 and went into effect 30 days later.  Under the final rule, a prescription can only be transferred once between pharmacies, and only if allowed under existing state or other applicable law. The prescription must remain in its electronic form; may not be altered in any way; and the transfer must be communicated directly between two licensed pharmacists. It's important to note, any authorized refills transfer with the original prescription, which means the entire prescription will be filled at the same pharmacy."    REFERENCES: 1. DEA website announcement [email or web address]  2. Department of Justice website  [email or web address].pdf  3. DEPARTMENT OF JUSTICE Drug Enforcement Administration 21 CFR Part 1306 [Docket No. [id number]] RIN [id number] "Transfer of Electronic Prescriptions for Schedules II-V Controlled Substances Between Pharmacies for Initial  Filling"  ______________________________________________________________________       ______________________________________________________________________    Medication Rules  Purpose: To inform patients, and their family members, of our medication rules and regulations.  Applies to: All patients receiving prescriptions from our practice (written or electronic).  Pharmacy of record: This is the pharmacy where your electronic prescriptions will be sent. Make sure we have the correct one.  Electronic prescriptions: In compliance with the [facility] Strengthen Opioid Misuse Prevention (STOP) Act of 2017 (Session Conni Elliot [phone]), effective October 04, 2018, all controlled substances must be electronically prescribed. Written prescriptions, faxing, or calling prescriptions to a pharmacy will no longer be done.  Prescription refills: These will be provided only during in-person appointments. No medications will be renewed without a "face-to-face" evaluation with your provider. Applies to all prescriptions.  NOTE: The following applies primarily to controlled substances (Opioid* Pain Medications).   Type of encounter (visit): For patients receiving controlled substances, face-to-face visits are required. (Not an option and not up to the patient.)  Patient's Responsibilities: Pain Pills: Bring all pain pills to every appointment (except for procedure appointments). Pill counts are required.  Pill Bottles: Bring pills in original pharmacy bottle. Bring bottle, even if empty. Always bring the bottle of the most recent fill.  Medication refills: You are responsible for knowing and keeping track of what medications you are taking and when is it that you will need a refill. The day before your appointment: write a list of all prescriptions that need to be refilled. The day of the appointment: give the list to  the admitting nurse. Prescriptions will be written only during appointments. No  prescriptions will be written on procedure days. If you forget a medication: it will not be "Called in", "Faxed", or "electronically sent". You will need to get another appointment to get these prescribed. No early refills. Do not call asking to have your prescription filled early. Partial  or short prescriptions: Occasionally your pharmacy may not have enough pills to fill your prescription.  NEVER ACCEPT a partial fill or a prescription that is short of the total amount of pills that you were prescribed.  With controlled substances the law allows 72 hours for the pharmacy to complete the prescription.  If the prescription is not completed within 72 hours, the pharmacist will require a new prescription to be written. This means that you will be short on your medicine and we WILL NOT send another prescription to complete your original prescription.  Instead, request the pharmacy to send a carrier to a nearby branch to get enough medication to provide you with your full prescription. Prescription Accuracy: You are responsible for carefully inspecting your prescriptions before leaving our office. Have the discharge nurse carefully go over each prescription with you, before taking them home. Make sure that your name is accurately spelled, that your address is correct. Check the name and dose of your medication to make sure it is accurate. Check the number of pills, and the written instructions to make sure they are clear and accurate. Make sure that you are given enough medication to last until your next medication refill appointment. Taking Medication: Take medication as prescribed. When it comes to controlled substances, taking less pills or less frequently than prescribed is permitted and encouraged. Never take more pills than instructed. Never take the medication more frequently than prescribed.  Inform other Doctors: Always inform, all of your healthcare providers, of all the medications you take. Pain  Medication from other Providers: You are not allowed to accept any additional pain medication from any other Doctor or Healthcare provider. There are two exceptions to this rule. (see below) In the event that you require additional pain medication, you are responsible for notifying us, as stated below. Cough Medicine: Often these contain an opioid, such as codeine or hydrocodone. Never accept or take cough medicine containing these opioids if you are already taking an opioid* medication. The combination may cause respiratory failure and death. Medication Agreement: You are responsible for carefully reading and following our Medication Agreement. This must be signed before receiving any prescriptions from our practice. Safely store a copy of your signed Agreement. Violations to the Agreement will result in no further prescriptions. (Additional copies of our Medication Agreement are available upon request.) Laws, Rules, & Regulations: All patients are expected to follow all [address] and [employer], [employer], Rules, [place] [employer]. Ignorance of the Laws does not constitute a valid excuse.  Illegal drugs and Controlled Substances: The use of illegal substances (including, but not limited to marijuana and its derivatives) and/or the illegal use of any controlled substances is strictly prohibited. Violation of this rule may result in the immediate and permanent discontinuation of any and all prescriptions being written by our practice. The use of any illegal substances is prohibited. Adopted CDC guidelines & recommendations: Target dosing levels will be at or below 60 MME/day. Use of benzodiazepines** is not recommended. Urine Drug testing: Patients taking controlled substances will be required to provide a urine sample upon request. Do not void before coming to your medication management appointments.  Hold emptying your bladder until you are admitted. The admitting nurse will inform you if a sample is required. Our  practice reserves the right to call you at any time to provide a sample. Once receiving the call, you have 24 hours to comply with request. Not providing a sample upon request may result in termination of medication therapy.  Exceptions: There are only two exceptions to the rule of not receiving pain medications from other Healthcare Providers. Exception #1 (Emergencies): In the event of an emergency (i.e.: accident requiring emergency care), you are allowed to receive additional pain medication. However, you are responsible for: As soon as you are able, call our office [phone], at any time of the day or night, and leave a message stating your name, the date and nature of the emergency, and the name and dose of the medication prescribed. In the event that your call is answered by a member of our staff, make sure to document and save the date, time, and the name of the person that took your information.  Exception #2 (Planned Surgery): In the event that you are scheduled by another doctor or dentist to have any type of surgery or procedure, you are allowed (for a period no longer than 30 days), to receive additional pain medication, for the acute post-op pain. However, in this case, you are responsible for picking up a copy of our "Post-op Pain Management for Surgeons" handout, and giving it to your surgeon or dentist. This document is available at our office, and does not require an appointment to obtain it. Simply go to our office during business hours (Monday-Thursday from 8:00 AM to 4:00 PM) (Friday 8:00 AM to 12:00 Noon) or if you have a scheduled appointment with [place], prior to your surgery, and ask for it by name. In addition, you are responsible for: calling our office [phone], at any time of the day or night, and leaving a message stating your name, name of your surgeon, type of surgery, and date of procedure or surgery. Failure to comply with your responsibilities may result in termination  of therapy involving the controlled substances.  Consequences:  Non-compliance with the above rules may result in permanent discontinuation of medication prescription therapy. All patients receiving any type of controlled substance is expected to comply with the above patient responsibilities. Not doing so may result in permanent discontinuation of medication prescription therapy. Medication Agreement Violation. Following the above rules, including your responsibilities will help you in avoiding a Medication Agreement Violation ("Breaking your Pain Medication Contract").  *Opioid medications include: morphine, codeine, oxycodone, oxymorphone, hydrocodone, hydromorphone, meperidine, tramadol, tapentadol, buprenorphine, fentanyl, methadone. **Benzodiazepine medications include: diazepam (Valium), alprazolam (Xanax), clonazepam (Klonopine), lorazepam (Ativan), clorazepate (Tranxene), chlordiazepoxide (Librium), estazolam (Prosom), oxazepam (Serax), temazepam (Restoril), triazolam (Halcion) (Last updated: 07/27/2023) ______________________________________________________________________      ______________________________________________________________________    Medication Recommendations and Reminders  Applies to: All patients receiving prescriptions (written and/or electronic).  Medication Rules & Regulations: You are responsible for reading, knowing, and following our "Medication Rules" document. These exist for your safety and that of others. They are not flexible and neither are we. Dismissing or ignoring them is an act of "non-compliance" that may result in complete and irreversible termination of such medication therapy. For safety reasons, "non-compliance" will not be tolerated. As with the [place] fundamental legal principle of "ignorance of the law is no defense", we will accept no excuses for not having read and knowing the content of documents provided to you by  our practice.  Pharmacy of  record:  Definition: This is the pharmacy where your electronic prescriptions will be sent.  We do not endorse any particular pharmacy. It is up to you and your insurance to decide what pharmacy to use.  We do not restrict you in your choice of pharmacy. However, once we write for your prescriptions, we will NOT be re-sending more prescriptions to fix restricted supply problems created by your pharmacy, or your insurance.  The pharmacy listed in the electronic medical record should be the one where you want electronic prescriptions to be sent. If you choose to change pharmacy, simply notify our nursing staff. Changes will be made only during your regular appointments and not over the phone.  Recommendations: Keep all of your pain medications in a safe place, under lock and key, even if you live alone. We will NOT replace lost, stolen, or damaged medication. We do not accept "Police Reports" as proof of medications having been stolen. After you fill your prescription, take 1 week's worth of pills and put them away in a safe place. You should keep a separate, properly labeled bottle for this purpose. The remainder should be kept in the original bottle. Use this as your primary supply, until it runs out. Once it's gone, then you know that you have 1 week's worth of medicine, and it is time to come in for a prescription refill. If you do this correctly, it is unlikely that you will ever run out of medicine. To make sure that the above recommendation works, it is very important that you make sure your medication refill appointments are scheduled at least 1 week before you run out of medicine. To do this in an effective manner, make sure that you do not leave the office without scheduling your next medication management appointment. Always ask the nursing staff to show you in your prescription , when your medication will be running out. Then arrange for the receptionist to get you a return appointment, at least  7 days before you run out of medicine. Do not wait until you have 1 or 2 pills left, to come in. This is very poor planning and does not take into consideration that we may need to cancel appointments due to bad weather, sickness, or emergencies affecting our staff. DO NOT ACCEPT A "Partial Fill": If for any reason your pharmacy does not have enough pills/tablets to completely fill or refill your prescription, do not allow for a "partial fill". The law allows the pharmacy to complete that prescription within 72 hours, without requiring a new prescription. If they do not fill the rest of your prescription within those 72 hours, you will need a separate prescription to fill the remaining amount, which we will NOT provide. If the reason for the partial fill is your insurance, you will need to talk to the pharmacist about payment alternatives for the remaining tablets, but again, DO NOT ACCEPT A PARTIAL FILL, unless you can trust your pharmacist to obtain the remainder of the pills within 72 hours.  Prescription refills and/or changes in medication(s):  Prescription refills, and/or changes in dose or medication, will be conducted only during scheduled medication management appointments. (Applies to both, written and electronic prescriptions.) No refills on procedure days. No medication will be changed or started on procedure days. No changes, adjustments, and/or refills will be conducted on a procedure day. Doing so will interfere with the diagnostic portion of the procedure. No phone refills. No medications will be "  called into the pharmacy". No Fax refills. No weekend refills. No Holliday refills. No after hours refills.  Remember:  Business hours are:  Monday to Thursday 8:00 AM to 4:00 PM Provider's Schedule: Delano Metz, MD - Appointments are:  Medication management: Monday and Wednesday 8:00 AM to 4:00 PM Procedure day: Tuesday and Thursday 7:30 AM to 4:00 PM Edward Jolly, MD -  Appointments are:  Medication management: Tuesday and Thursday 8:00 AM to 4:00 PM Procedure day: Monday and Wednesday 7:30 AM to 4:00 PM (Last update: 07/27/2022) ______________________________________________________________________

## 2024-05-08 NOTE — Progress Notes (Signed)
 Safety precautions to be maintained throughout the outpatient stay will include: orient to surroundings, keep bed in low position, maintain call bell within reach at all times, provide assistance with transfer out of bed and ambulation.

## 2024-05-10 ENCOUNTER — Ambulatory Visit: Payer: Medicare (Managed Care) | Admitting: Pain Medicine

## 2024-06-06 ENCOUNTER — Encounter: Payer: Self-pay | Admitting: Pain Medicine

## 2024-06-06 ENCOUNTER — Ambulatory Visit: Payer: Medicare (Managed Care) | Attending: Pain Medicine | Admitting: Pain Medicine

## 2024-06-06 VITALS — BP 165/92 | HR 95 | Temp 96.9°F | Resp 16 | Ht 72.0 in | Wt 178.0 lb

## 2024-06-06 DIAGNOSIS — Z79899 Other long term (current) drug therapy: Secondary | ICD-10-CM

## 2024-06-06 DIAGNOSIS — M79605 Pain in left leg: Secondary | ICD-10-CM | POA: Diagnosis not present

## 2024-06-06 DIAGNOSIS — M545 Low back pain, unspecified: Secondary | ICD-10-CM

## 2024-06-06 DIAGNOSIS — G894 Chronic pain syndrome: Secondary | ICD-10-CM

## 2024-06-06 DIAGNOSIS — F1290 Cannabis use, unspecified, uncomplicated: Secondary | ICD-10-CM

## 2024-06-06 DIAGNOSIS — M25551 Pain in right hip: Secondary | ICD-10-CM

## 2024-06-06 DIAGNOSIS — M79604 Pain in right leg: Secondary | ICD-10-CM | POA: Diagnosis not present

## 2024-06-06 DIAGNOSIS — R892 Abnormal level of other drugs, medicaments and biological substances in specimens from other organs, systems and tissues: Secondary | ICD-10-CM

## 2024-06-06 DIAGNOSIS — G8929 Other chronic pain: Secondary | ICD-10-CM

## 2024-06-06 DIAGNOSIS — Z79891 Long term (current) use of opiate analgesic: Secondary | ICD-10-CM

## 2024-06-06 DIAGNOSIS — M4820 Kissing spine, site unspecified: Secondary | ICD-10-CM

## 2024-06-06 DIAGNOSIS — Z5181 Encounter for therapeutic drug level monitoring: Secondary | ICD-10-CM

## 2024-06-06 DIAGNOSIS — M25552 Pain in left hip: Secondary | ICD-10-CM

## 2024-06-06 MED ORDER — TRAMADOL HCL 50 MG PO TABS: 100.0000 mg | ORAL_TABLET | Freq: Four times a day (QID) | ORAL | 0 refills | Status: DC | PRN

## 2024-06-06 NOTE — Patient Instructions (Signed)
 ______________________________________________________________________    Opioid Pain Medication Update  To: All patients taking opioid pain medications. (I.e.: hydrocodone , hydromorphone, oxycodone , oxymorphone, morphine , codeine, methadone , tapentadol , tramadol , buprenorphine , fentanyl , etc.)  Re: Updated review of side effects and adverse reactions of opioid analgesics, as well as new information about long term effects of this class of medications.  Direct risks of long-term opioid therapy are not limited to opioid addiction and overdose. Potential medical risks include serious fractures, breathing problems during sleep, hyperalgesia, immunosuppression, chronic constipation, bowel obstruction, myocardial infarction, and tooth decay secondary to xerostomia.  Unpredictable adverse effects that can occur even if you take your medication correctly: Cognitive impairment, respiratory depression, and death. Most people think that if they take their medication correctly, and as instructed, that they will be safe. Nothing could be farther from the truth. In reality, a significant amount of recorded deaths associated with the use of opioids has occurred in individuals that had taken the medication for a long time, and were taking their medication correctly. The following are examples of how this can happen: Patient taking his/her medication for a long time, as instructed, without any side effects, is given a certain antibiotic or another unrelated medication, which in turn triggers a Drug-to-drug interaction leading to disorientation, cognitive impairment, impaired reflexes, respiratory depression or an untoward event leading to serious bodily harm or injury, including death.  Patient taking his/her medication for a long time, as instructed, without any side effects, develops an acute impairment of liver and/or kidney function. This will lead to a rapid inability of the body to breakdown and eliminate  their pain medication, which will result in effects similar to an overdose, but with the same medicine and dose that they had always taken. This again may lead to disorientation, cognitive impairment, impaired reflexes, respiratory depression or an untoward event leading to serious bodily harm or injury, including death.  A similar problem will occur with patients as they grow older and their liver and kidney function begins to decrease as part of the aging process.  Background information: Historically, the original case for using long-term opioid therapy to treat chronic noncancer pain was based on safety assumptions that subsequent experience has called into question. In 1996, the American Pain Society and the American Academy of Pain Medicine issued a consensus statement supporting long-term opioid therapy. This statement acknowledged the dangers of opioid prescribing but concluded that the risk for addiction was low; respiratory depression induced by opioids was short-lived, occurred mainly in opioid-naive patients, and was antagonized by pain; tolerance was not a common problem; and efforts to control diversion should not constrain opioid prescribing. This has now proven to be wrong. Experience regarding the risks for opioid addiction, misuse, and overdose in community practice has failed to support these assumptions.  According to the Centers for Disease Control and Prevention, fatal overdoses involving opioid analgesics have increased sharply over the past decade. Currently, more than 96,700 people die from drug overdoses every year. Opioids are a factor in 7 out of every 10 overdose deaths. Deaths from drug overdose have surpassed motor vehicle accidents as the leading cause of death for individuals between the ages of 71 and 36.  Clinical data suggest that neuroendocrine dysfunction may be very common in both men and women, potentially causing hypogonadism, erectile dysfunction, infertility,  decreased libido, osteoporosis, and depression. Recent studies linked higher opioid dose to increased opioid-related mortality. Controlled observational studies reported that long-term opioid therapy may be associated with increased risk for cardiovascular events. Subsequent  meta-analysis concluded that the safety of long-term opioid therapy in elderly patients has not been proven.   Side Effects and adverse reactions: Common side effects: Drowsiness (sedation). Dizziness. Nausea and vomiting. Constipation. Physical dependence -- Dependence often manifests with withdrawal symptoms when opioids are discontinued or decreased. Tolerance -- As you take repeated doses of opioids, you require increased medication to experience the same effect of pain relief. Respiratory depression -- This can occur in healthy people, especially with higher doses. However, people with COPD, asthma or other lung conditions may be even more susceptible to fatal respiratory impairment.  Uncommon side effects: An increased sensitivity to feeling pain and extreme response to pain (hyperalgesia). Chronic use of opioids can lead to this. Delayed gastric emptying (the process by which the contents of your stomach are moved into your small intestine). Muscle rigidity. Immune system and hormonal dysfunction. Quick, involuntary muscle jerks (myoclonus). Arrhythmia. Itchy skin (pruritus). Dry mouth (xerostomia).  Long-term side effects: Chronic constipation. Sleep-disordered breathing (SDB). Increased risk of bone fractures. Hypothalamic-pituitary-adrenal dysregulation. Increased risk of overdose.  RISKS: Respiratory depression and death: Opioids increase the risk of respiratory depression and death.  Drug-to-drug interactions: Opioids are relatively contraindicated in combination with benzodiazepines, sleep inducers, and other central nervous system depressants. Other classes of medications (i.e.: certain antibiotics  and even over-the-counter medications) may also trigger or induce respiratory depression in some patients.  Medical conditions: Patients with pre-existing respiratory problems are at higher risk of respiratory failure and/or depression when in combination with opioid analgesics. Opioids are relatively contraindicated in some medical conditions such as central sleep apnea.   Fractures and Falls:  Opioids increase the risk and incidence of falls. This is of particular importance in elderly patients.  Endocrine System:  Long-term administration is associated with endocrine abnormalities (endocrinopathies). (Also known as Opioid-induced Endocrinopathy) Influences on both the hypothalamic-pituitary-adrenal axis?and the hypothalamic-pituitary-gonadal axis have been demonstrated with consequent hypogonadism and adrenal insufficiency in both sexes. Hypogonadism and decreased levels of dehydroepiandrosterone sulfate have been reported in men and women. Endocrine effects include: Amenorrhoea in women (abnormal absence of menstruation) Reduced libido in both sexes Decreased sexual function Erectile dysfunction in men Hypogonadisms (decreased testicular function with shrinkage of testicles) Infertility Depression and fatigue Loss of muscle mass Anxiety Depression Immune suppression Hyperalgesia Weight gain Anemia Osteoporosis Patients (particularly women of childbearing age) should avoid opioids. There is insufficient evidence to recommend routine monitoring of asymptomatic patients taking opioids in the long-term for hormonal deficiencies.  Immune System: Human studies have demonstrated that opioids have an immunomodulating effect. These effects are mediated via opioid receptors both on immune effector cells and in the central nervous system. Opioids have been demonstrated to have adverse effects on antimicrobial response and anti-tumour surveillance. Buprenorphine  has been demonstrated to have  no impact on immune function.  Opioid Induced Hyperalgesia: Human studies have demonstrated that prolonged use of opioids can lead to a state of abnormal pain sensitivity, sometimes called opioid induced hyperalgesia (OIH). Opioid induced hyperalgesia is not usually seen in the absence of tolerance to opioid analgesia. Clinically, hyperalgesia may be diagnosed if the patient on long-term opioid therapy presents with increased pain. This might be qualitatively and anatomically distinct from pain related to disease progression or to breakthrough pain resulting from development of opioid tolerance. Pain associated with hyperalgesia tends to be more diffuse than the pre-existing pain and less defined in quality. Management of opioid induced hyperalgesia requires opioid dose reduction.  Cancer: Chronic opioid therapy has been associated with an increased risk  of cancer among noncancer patients with chronic pain. This association was more evident in chronic strong opioid users. Chronic opioid consumption causes significant pathological changes in the small intestine and colon. Epidemiological studies have found that there is a link between opium  dependence and initiation of gastrointestinal cancers. Cancer is the second leading cause of death after cardiovascular disease. Chronic use of opioids can cause multiple conditions such as GERD, immunosuppression and renal damage as well as carcinogenic effects, which are associated with the incidence of cancers.   Mortality: Long-term opioid use has been associated with increased mortality among patients with chronic non-cancer pain (CNCP).  Prescription of long-acting opioids for chronic noncancer pain was associated with a significantly increased risk of all-cause mortality, including deaths from causes other than overdose.  Reference: Von Korff M, Kolodny A, Deyo RA, Chou R. Long-term opioid therapy reconsidered. Ann Intern Med. 2011 Sep 6;155(5):325-8. doi:  10.7326/[id number]. PMID: 78106373; PMCID: [id number]. Kit JINNY Laurence CINDERELLA Pearley JINNY, Hayward RA, Dunn KM, [place] KP. Risk of adverse events in patients prescribed long-term opioids: A cohort study in the [employer]. Eur J Pain. 2019 May;23(5):908-922. doi: 10.1002/ejp.1357. Epub 2019 Jan 31. PMID: 69379883. Colameco S, Coren JS, Ciervo CA. Continuous opioid treatment for chronic noncancer pain: a time for moderation in prescribing. Postgrad Med. 2009 Jul;121(4):61-6. doi: 10.3810/pgm.2009.07.2032. PMID: 80358728. Gigi JONELLE Shlomo MILUS Levern IVER Conny RN, Geraldine SD, Blazina I, Lonell DASEN, Bougatsos C, Deyo RA. The effectiveness and risks of long-term opioid therapy for chronic pain: a systematic review for a [employer] of [employer] Pathways to [employer]. Ann Intern Med. 2015 Feb 17;162(4):276-86. doi: 10.7326/M14-2559. PMID: 74418742. Rory CHRISTELLA Laurence [facility], [place] DM. NCHS Data Brief No. 22. [place]: Centers for Disease Control and Prevention; 2009. Sep, Increase in Fatal Poisonings Involving Opioid Analgesics in the [place] , 1999-2006. Song IA, Choi HR, Oh TK. Long-term opioid use and mortality in patients with chronic non-cancer pain: Ten-year follow-up study in [place] from 2010 through 2019. EClinicalMedicine. 2022 Jul 18;51:101558. doi: 10.1016/j.eclinm.2022.898441. PMID: 64124182; PMCID: EFR0695089. Huser, W., Schubert, T., Vogelmann, T. et al. All-cause mortality in patients with long-term opioid therapy compared with non-opioid analgesics for chronic non-cancer pain: a database study. BMC Med 18, 162 (2020). http://lester.info/ Rashidian H, Zendehdel K, Kamangar F, Malekzadeh R, Haghdoost AA. An Ecological Study of the Association between Opiate Use and Incidence of Cancers. Addict Health. 2016 Fall;8(4):252-260. PMID: 71180443; PMCID: EFR4445194.  Our Goal: Our goal is to control your pain with means other  than the use of opioid pain medications.  Our Recommendation: Talk to your physician about coming off of these medications. We can assist you with the tapering down and stopping these medicines. Based on the new information, even if you cannot completely stop the medication, a decrease in the dose may be associated with a lesser risk. Ask for other means of controlling the pain. Decrease or eliminate those factors that significantly contribute to your pain such as smoking, obesity, and a diet heavily tilted towards inflammatory nutrients.  Last Updated: 04/11/2023   ______________________________________________________________________       ______________________________________________________________________    National Pain Medication Shortage  The [place] is experiencing worsening drug shortages. These have had a negative widespread effect on patient care and treatment. Not expected to improve any time soon. Predicted to last past 2029.   Drug shortage list (generic names) Oxycodone  IR Oxycodone /APAP Oxymorphone IR Hydromorphone Hydrocodone /APAP Morphine   Where is the problem?  Manufacturing and supply level.  Will this shortage  affect you?  Only if you take any of the above pain medications.  How? You may be unable to fill your prescription.  Your pharmacist may offer a partial fill of your prescription. (Warning: Do not accept partial fills.) Prescriptions partially filled cannot be transferred to another pharmacy. Read our Medication Rules and Regulation. Depending on how much medicine you are dependent on, you may experience withdrawals when unable to get the medication.  Recommendations: Consider ending your dependence on opioid pain medications. Ask your pain specialist to assist you with the process. Consider switching to a medication currently not in shortage, such as Buprenorphine . Talk to your pain specialist about this option. Consider decreasing your pain  medication requirements by managing tolerance thru Drug Holidays. This may help minimize withdrawals, should you run out of medicine. Control your pain thru the use of non-pharmacological interventional therapies.   Your prescriber: Prescribers cannot be blamed for shortages. Medication manufacturing and supply issues cannot be fixed by the prescriber.   NOTE: The prescriber is not responsible for supplying the medication, or solving supply issues. Work with your pharmacist to solve it. The patient is responsible for the decision to take or continue taking the medication and for identifying and securing a legal supply source. By law, supplying the medication is the job and responsibility of the pharmacy. The prescriber is responsible for the evaluation, monitoring, and prescribing of these medications.   Prescribers will NOT: Re-issue prescriptions that have been partially filled. Re-issue prescriptions already sent to a pharmacy.  Re-send prescriptions to a different pharmacy because yours did not have your medication. Ask pharmacist to order more medicine or transfer the prescription to another pharmacy. (Read below.)  New 2023 regulation: June 04, 2022 Revised Regulation Allows DEA-Registered Pharmacies to Transfer Electronic Prescriptions at a Patient's Request DEA Headquarters Division - Public Information Office Patients now have the ability to request their electronic prescription be transferred to another pharmacy without having to go back to their practitioner to initiate the request. This revised regulation went into effect on Monday, May 31, 2022.     At a patient's request, a DEA-registered retail pharmacy can now transfer an electronic prescription for a controlled substance (schedules II-V) to another DEA-registered retail pharmacy. Prior to this change, patients would have to go through their practitioner to cancel their prescription and have it re-issued to a different  pharmacy. The process was taxing and time consuming for both patients and practitioners.    The Drug Enforcement Administration [facility]) published its intent to revise the process for transferring electronic prescriptions on August 22, 2020.  The final rule was published in the federal register on April 29, 2022 and went into effect 30 days later.  Under the final rule, a prescription can only be transferred once between pharmacies, and only if allowed under existing state or other applicable law. The prescription must remain in its electronic form; may not be altered in any way; and the transfer must be communicated directly between two licensed pharmacists. It's important to note, any authorized refills transfer with the original prescription, which means the entire prescription will be filled at the same pharmacy.  Reference: [email or web address].is [facility] website announcement)  [email or web address] [employer] of [employer])   [employer] / Vol. 88, No. 143 / Thursday, April 29, 2022 / Rules and Regulations DEPARTMENT OF JUSTICE  Drug Enforcement Administration  21 CFR Part 1306  [Docket No. [id number]]  RIN [id number] Transfer of Electronic Prescriptions for Schedules II-V Controlled Substances Between  Pharmacies for Initial Filling  ______________________________________________________________________       ______________________________________________________________________    Transfer of Pain Medication between Pharmacies  Re: 2023 DEA Clarification on existing regulation  Published on DEA Website: June 04, 2022  Title: Revised Regulation Allows DEA-Registered Pharmacies to Electrical engineer Prescriptions at a Patient's Request DEA Headquarters Division - [employer]  Patients now have the ability to  request their electronic prescription be transferred to another pharmacy without having to go back to their practitioner to initiate the request. This revised regulation went into effect on Monday, May 31, 2022.     At a patient's request, a DEA-registered retail pharmacy can now transfer an electronic prescription for a controlled substance (schedules II-V) to another DEA-registered retail pharmacy. Prior to this change, patients would have to go through their practitioner to cancel their prescription and have it re-issued to a different pharmacy. The process was taxing and time consuming for both patients and practitioners.    The Drug Enforcement Administration [facility]) published its intent to revise the process for transferring electronic prescriptions on August 22, 2020.  The final rule was published in the federal register on April 29, 2022 and went into effect 30 days later.  Under the final rule, a prescription can only be transferred once between pharmacies, and only if allowed under existing state or other applicable law. The prescription must remain in its electronic form; may not be altered in any way; and the transfer must be communicated directly between two licensed pharmacists. It's important to note, any authorized refills transfer with the original prescription, which means the entire prescription will be filled at the same pharmacy.    REFERENCES: 1. DEA website announcement [email or web address]  2. Department of Justice website  [email or web address].pdf  3. DEPARTMENT OF JUSTICE Drug Enforcement Administration 21 CFR Part 1306 [Docket No. [id number]] RIN [id number]-[id number] Transfer of Electronic Prescriptions for Schedules II-V Controlled Substances Between Pharmacies for Initial  Filling  ______________________________________________________________________       ______________________________________________________________________    Medication Rules  Purpose: To inform patients, and their family members, of our medication rules and regulations.  Applies to: All patients receiving prescriptions from our practice (written or electronic).  Pharmacy of record: This is the pharmacy where your electronic prescriptions will be sent. Make sure we have the correct one.  Electronic prescriptions: In compliance with the [place]  Strengthen Opioid Misuse Prevention (STOP) Act of 2017 (Session Law 2017-74/H243), effective October 04, 2018, all controlled substances must be electronically prescribed. Written prescriptions, faxing, or calling prescriptions to a pharmacy will no longer be done.  Prescription refills: These will be provided only during in-person appointments. No medications will be renewed without a face-to-face evaluation with your provider. Applies to all prescriptions.  NOTE: The following applies primarily to controlled substances (Opioid* Pain Medications).   Type of encounter (visit): For patients receiving controlled substances, face-to-face visits are required. (Not an option and not up to the patient.)  Patient's Responsibilities: Pain Pills: Bring all pain pills to every appointment (except for procedure appointments). Pill counts are required.  Pill Bottles: Bring pills in original pharmacy bottle. Bring bottle, even if empty. Always bring the bottle of the most recent fill.  Medication refills: You are responsible for knowing and keeping track of what medications you are taking and when is it that you will need a refill. The day before your appointment: write a list of all prescriptions that need to be refilled. The day of the appointment: give the list to  the admitting nurse. Prescriptions will be written only during appointments. No  prescriptions will be written on procedure days. If you forget a medication: it will not be Called in, Faxed, or electronically sent. You will need to get another appointment to get these prescribed. No early refills. Do not call asking to have your prescription filled early. Partial  or short prescriptions: Occasionally your pharmacy may not have enough pills to fill your prescription.  NEVER ACCEPT a partial fill or a prescription that is short of the total amount of pills that you were prescribed.  With controlled substances the law allows 72 hours for the pharmacy to complete the prescription.  If the prescription is not completed within 72 hours, the pharmacist will require a new prescription to be written. This means that you will be short on your medicine and we WILL NOT send another prescription to complete your original prescription.  Instead, request the pharmacy to send a carrier to a nearby branch to get enough medication to provide you with your full prescription. Prescription Accuracy: You are responsible for carefully inspecting your prescriptions before leaving our office. Have the discharge nurse carefully go over each prescription with you, before taking them home. Make sure that your name is accurately spelled, that your address is correct. Check the name and dose of your medication to make sure it is accurate. Check the number of pills, and the written instructions to make sure they are clear and accurate. Make sure that you are given enough medication to last until your next medication refill appointment. Taking Medication: Take medication as prescribed. When it comes to controlled substances, taking less pills or less frequently than prescribed is permitted and encouraged. Never take more pills than instructed. Never take the medication more frequently than prescribed.  Inform other Doctors: Always inform, all of your healthcare providers, of all the medications you take. Pain  Medication from other Providers: You are not allowed to accept any additional pain medication from any other Doctor or Healthcare provider. There are two exceptions to this rule. (see below) In the event that you require additional pain medication, you are responsible for notifying us , as stated below. Cough Medicine: Often these contain an opioid, such as codeine or hydrocodone . Never accept or take cough medicine containing these opioids if you are already taking an opioid* medication. The combination may cause respiratory failure and death. Medication Agreement: You are responsible for carefully reading and following our Medication Agreement. This must be signed before receiving any prescriptions from our practice. Safely store a copy of your signed Agreement. Violations to the Agreement will result in no further prescriptions. (Additional copies of our Medication Agreement are available upon request.) Laws, Rules, & Regulations: All patients are expected to follow all [address] and [employer], [employer], Rules, [place] [employer]. Ignorance of the Laws does not constitute a valid excuse.  Illegal drugs and Controlled Substances: The use of illegal substances (including, but not limited to marijuana and its derivatives) and/or the illegal use of any controlled substances is strictly prohibited. Violation of this rule may result in the immediate and permanent discontinuation of any and all prescriptions being written by our practice. The use of any illegal substances is prohibited. Adopted CDC guidelines & recommendations: Target dosing levels will be at or below 60 MME/day. Use of benzodiazepines** is not recommended. Urine Drug testing: Patients taking controlled substances will be required to provide a urine sample upon request. Do not void before coming to your medication management appointments.  Hold emptying your bladder until you are admitted. The admitting nurse will inform you if a sample is required. Our  practice reserves the right to call you at any time to provide a sample. Once receiving the call, you have 24 hours to comply with request. Not providing a sample upon request may result in termination of medication therapy.  Exceptions: There are only two exceptions to the rule of not receiving pain medications from other Healthcare Providers. Exception #1 (Emergencies): In the event of an emergency (i.e.: accident requiring emergency care), you are allowed to receive additional pain medication. However, you are responsible for: As soon as you are able, call our office [phone], at any time of the day or night, and leave a message stating your name, the date and nature of the emergency, and the name and dose of the medication prescribed. In the event that your call is answered by a member of our staff, make sure to document and save the date, time, and the name of the person that took your information.  Exception #2 (Planned Surgery): In the event that you are scheduled by another doctor or dentist to have any type of surgery or procedure, you are allowed (for a period no longer than 30 days), to receive additional pain medication, for the acute post-op pain. However, in this case, you are responsible for picking up a copy of our Post-op Pain Management for Surgeons handout, and giving it to your surgeon or dentist. This document is available at our office, and does not require an appointment to obtain it. Simply go to our office during business hours (Monday-Thursday from 8:00 AM to 4:00 PM) (Friday 8:00 AM to 12:00 Noon) or if you have a scheduled appointment with us , prior to your surgery, and ask for it by name. In addition, you are responsible for: calling our office [phone], at any time of the day or night, and leaving a message stating your name, name of your surgeon, type of surgery, and date of procedure or surgery. Failure to comply with your responsibilities may result in termination  of therapy involving the controlled substances.  Consequences:  Non-compliance with the above rules may result in permanent discontinuation of medication prescription therapy. All patients receiving any type of controlled substance is expected to comply with the above patient responsibilities. Not doing so may result in permanent discontinuation of medication prescription therapy. Medication Agreement Violation. Following the above rules, including your responsibilities will help you in avoiding a Medication Agreement Violation ("Breaking your Pain Medication Contract").  *Opioid medications include: morphine , codeine, oxycodone , oxymorphone, hydrocodone , hydromorphone, meperidine, tramadol , tapentadol , buprenorphine , fentanyl , methadone . **Benzodiazepine medications include: diazepam (Valium), alprazolam  (Xanax ), clonazepam (Klonopine), lorazepam (Ativan), clorazepate (Tranxene), chlordiazepoxide (Librium), estazolam (Prosom), oxazepam (Serax), temazepam (Restoril), triazolam (Halcion) (Last updated: 07/27/2023) ______________________________________________________________________     ______________________________________________________________________    Medication Recommendations and Reminders  Applies to: All patients receiving prescriptions (written and/or electronic).  Medication Rules & Regulations: You are responsible for reading, knowing, and following our Medication Rules document. These exist for your safety and that of others. They are not flexible and neither are we. Dismissing or ignoring them is an act of non-compliance that may result in complete and irreversible termination of such medication therapy. For safety reasons, non-compliance will not be tolerated. As with the [place] fundamental legal principle of ignorance of the law is no defense, we will accept no excuses for not having read and knowing the content of documents provided to you by our  practice.  Pharmacy of  record:  Definition: This is the pharmacy where your electronic prescriptions will be sent.  We do not endorse any particular pharmacy. It is up to you and your insurance to decide what pharmacy to use.  We do not restrict you in your choice of pharmacy. However, once we write for your prescriptions, we will NOT be re-sending more prescriptions to fix restricted supply problems created by your pharmacy, or your insurance.  The pharmacy listed in the electronic medical record should be the one where you want electronic prescriptions to be sent. If you choose to change pharmacy, simply notify our nursing staff. Changes will be made only during your regular appointments and not over the phone.  Recommendations: Keep all of your pain medications in a safe place, under lock and key, even if you live alone. We will NOT replace lost, stolen, or damaged medication. We do not accept Police Reports as proof of medications having been stolen. After you fill your prescription, take 1 week's worth of pills and put them away in a safe place. You should keep a separate, properly labeled bottle for this purpose. The remainder should be kept in the original bottle. Use this as your primary supply, until it runs out. Once it's gone, then you know that you have 1 week's worth of medicine, and it is time to come in for a prescription refill. If you do this correctly, it is unlikely that you will ever run out of medicine. To make sure that the above recommendation works, it is very important that you make sure your medication refill appointments are scheduled at least 1 week before you run out of medicine. To do this in an effective manner, make sure that you do not leave the office without scheduling your next medication management appointment. Always ask the nursing staff to show you in your prescription , when your medication will be running out. Then arrange for the receptionist to get you a return appointment, at least  7 days before you run out of medicine. Do not wait until you have 1 or 2 pills left, to come in. This is very poor planning and does not take into consideration that we may need to cancel appointments due to bad weather, sickness, or emergencies affecting our staff. DO NOT ACCEPT A Partial Fill: If for any reason your pharmacy does not have enough pills/tablets to completely fill or refill your prescription, do not allow for a partial fill. The law allows the pharmacy to complete that prescription within 72 hours, without requiring a new prescription. If they do not fill the rest of your prescription within those 72 hours, you will need a separate prescription to fill the remaining amount, which we will NOT provide. If the reason for the partial fill is your insurance, you will need to talk to the pharmacist about payment alternatives for the remaining tablets, but again, DO NOT ACCEPT A PARTIAL FILL, unless you can trust your pharmacist to obtain the remainder of the pills within 72 hours.  Prescription refills and/or changes in medication(s):  Prescription refills, and/or changes in dose or medication, will be conducted only during scheduled medication management appointments. (Applies to both, written and electronic prescriptions.) No refills on procedure days. No medication will be changed or started on procedure days. No changes, adjustments, and/or refills will be conducted on a procedure day. Doing so will interfere with the diagnostic portion of the procedure. No phone refills. No medications will be called  into the pharmacy. No Fax refills. No weekend refills. No Holliday refills. No after hours refills.  Remember:  Business hours are:  Monday to Thursday 8:00 AM to 4:00 PM Provider's Schedule: Eric Como, MD - Appointments are:  Medication management: Monday and Wednesday 8:00 AM to 4:00 PM Procedure day: Tuesday and Thursday 7:30 AM to 4:00 PM Wallie Sherry, MD -  Appointments are:  Medication management: Tuesday and Thursday 8:00 AM to 4:00 PM Procedure day: Monday and Wednesday 7:30 AM to 4:00 PM (Last update: 07/27/2022) ______________________________________________________________________     ______________________________________________________________________    Drug Holidays  What is a Drug Holiday? Drug Holiday: is the name given to the process of slowly tapering down and temporarily stopping the pain medication for the purpose of decreasing or eliminating tolerance to the drug.  Benefits Improved effectiveness Decreased required effective dose Improved pain control End dependence on high dose therapy Decrease cost of therapy Uncovering opioid-induced hyperalgesia. (OIH)  What is opioid hyperalgesia? It is a paradoxical increase in pain caused by exposure to opioids. Stopping the opioid pain medication, contrary to the expected, it actually decreases or completely eliminates the pain. Ref.: A comprehensive review of opioid-induced hyperalgesia. Marcos Ruth, et.al. Pain Physician. 2011 Mar-Apr;14(2):145-61.  What is tolerance? Tolerance: the progressive loss of effectiveness of a pain medicine due to repetitive use. A common problem of opioid pain medications.  How long should a Drug Holiday last? Effectiveness depends on the patient staying off all opioid pain medicines for a minimum of 14 consecutive days. (2 weeks) During this time the patient should not be taking any other opioid analgesic medication.  How about just taking less of the medicine? Does not work. Will not accomplish goal of eliminating the excess receptors.  How about switching to a different pain medicine? (AKA. Opioid rotation) Does not work. Creates the illusion of effectiveness by taking advantage of inaccurate equivalent dose calculations between different opioids. -This technique was promoted by studies funded by [employer], such  as [employer], creators of OxyContin .  Can I stop the medicine cold [place]? We do not recommend it. You should always coordinate with your prescribing physician to make the transition as smoothly as possible. Avoid stopping the medicine abruptly without consulting. We recommend a slow taper.  What is a slow taper? Taper: refers to the gradual decrease in dose.   How do I stop/taper the dose? Slowly. Decrease the daily amount of pills that you take by one (1) pill every seven (7) days. This is called a slow downward taper. Example: if you normally take four (4) pills per day, drop it to three (3) pills per day for seven (7) days, then to two (2) pills per day for seven (7) days, then to one (1) per day for seven (7) days, and then stop the medicine. The 14 day Drug Holiday starts on the first day without medicine.   Will I experience withdrawals? Unlikely with a slow taper.  What triggers withdrawals? Withdrawals are triggered by the sudden/abrupt stop of high dose opioids. Withdrawals can be avoided by slowly decreasing the dose over a prolonged period of time.  What are withdrawals? Symptoms associated with sudden/abrupt reduction/stopping of high-dose, long-term use of pain medication. Withdrawal are seldom seen on low dose therapy, or patients rarely taking opioid medication.  Early Withdrawal Symptoms may include: Agitation Anxiety Muscle aches Increased tearing Insomnia Runny nose Sweating Yawning  Late symptoms may include: Abdominal cramping Diarrhea Dilated pupils Goose bumps Nausea Vomiting  When  could I see withdrawals? Onset: 8-24 hours after last use for most opioids. 12-48 hours for long-acting opioids (i.e.: methadone )  How long could they last? Duration: 4-10 days for most opioids. 14-21 days for long-acting opioids (i.e.: methadone )  What will happen after I complete my Drug Holiday? The need and indications for the opioid analgesic will be  reviewed before restarting the medication. Dose requirements will likely decrease and the dose will need to be adjusted accordingly.   (Last update: 07/27/2023) ______________________________________________________________________     ______________________________________________________________________    WARNING: CBD (cannabidiol ) & Delta (Delta-8 tetrahydrocannabinol) products.   Applicable to:  All individuals currently taking or considering taking CBD (cannabidiol ) and, more important, all patients taking opioid analgesic controlled substances (pain medication). (Example: oxycodone ; oxymorphone; hydrocodone ; hydromorphone; morphine ; methadone ; tramadol ; tapentadol ; fentanyl ; buprenorphine ; butorphanol; dextromethorphan; meperidine; codeine; etc.)  Introduction:  Recently there has been a drive towards the use of natural products for the treatment of different conditions, including pain anxiety and sleep disorders. Marijuana and hemp are two varieties of the cannabis genus plants. Marijuana and its derivatives are illegal, while hemp and its derivatives are not. Cannabidiol  (CBD) and tetrahydrocannabinol (THC), are two natural compounds found in plants of the Cannabis genus. They can both be extracted from hemp or marijuana. Both compounds interact with your body's endocannabinoid system in very different ways. CBD is associated with pain relief (analgesia) while THC is associated with the psychoactive effects (the high) obtained from the use of marijuana products. There are two main types of THC: Delta-9, which comes from the marijuana plant and it is illegal, and Delta-8, which comes from the hemp plant, and it is legal. (Both, Delta-9-THC and Delta-8-THC are psychoactive and give you the high.)   Legality:  Marijuana and its derivatives: illegal Hemp and its derivatives: Legal (State dependent) UPDATE: (11/20/2021) The Drug Enforcement Agency (DEA) issued a letter stating that  delta cannabinoids, including Delta-8-THCO and Delta-9-THCO, synthetically derived from hemp do not qualify as hemp and will be viewed as Schedule I drugs. (Schedule I drugs, substances, or chemicals are defined as drugs with no currently accepted medical use and a high potential for abuse. Some examples of Schedule I drugs are: heroin, lysergic acid diethylamide (LSD), marijuana (cannabis), 3,4-methylenedioxymethamphetamine (ecstasy), methaqualone, and peyote.) ([email or web address])  Legal status of CBD in [place]:  Conditionally Legal  Reference: FDA Regulation of Cannabis and Cannabis-Derived Products, Including Cannabidiol  (CBD) - https://www.fda.gov/news-events/public-health-focus/fda-regulation-cannabis-and-cannabis-derived-products-including-cannabidiol -cbd  Warning:  CBD is not FDA approved and has not undergo the same manufacturing controls as prescription drugs.  This means that the purity and safety of available CBD may be questionable. Most of the time, despite manufacturer's claims, it is contaminated with THC (delta-9-tetrahydrocannabinol - the chemical in marijuana responsible for the HIGH).  When this is the case, the [facility] contaminant will trigger a positive urine drug screen (UDS) test for Marijuana (carboxy-THC).   The FDA recently put out a warning about 5 things that everyone should be aware of regarding Delta-8 THC: Delta-8 THC products have not been evaluated or approved by the FDA for safe use and may be marketed in ways that put the public health at risk. The FDA has received adverse event reports involving delta-8 THC-containing products. Delta-8 THC has psychoactive and intoxicating effects. Delta-8 THC manufacturing often involve use of potentially harmful chemicals to create the concentrations of delta-8 THC claimed in the marketplace. The final delta-8 THC product may have potentially harmful by-products (contaminants) due to the chemicals used in the process.  Manufacturing of  delta-8 THC products may occur in uncontrolled or unsanitary settings, which may lead to the presence of unsafe contaminants or other potentially harmful substances. Delta-8 THC products should be kept out of the reach of children and pets.  NOTE: Because a positive UDS for any illicit substance is a violation of our medication agreement, your opioid analgesics (pain medicine) may be permanently discontinued.  MORE ABOUT CBD  General Information: CBD was discovered in [birth year] and it is a derivative of the cannabis sativa genus plants (Marijuana and Hemp). It is one of the 113 identified substances found in Marijuana. It accounts for up to 40% of the plant's extract. As of 2018, preliminary clinical studies on CBD included research for the treatment of anxiety, movement disorders, and pain. CBD is available and consumed in multiple forms, including inhalation of smoke or vapor, as an aerosol spray, and by mouth. It may be supplied as an oil containing CBD, capsules, dried cannabis, or as a liquid solution. CBD is thought not to be as psychoactive as THC (delta-9-tetrahydrocannabinol - the chemical in marijuana responsible for the HIGH). Studies suggest that CBD may interact with different biological target receptors in the body, including cannabinoid and other neurotransmitter receptors. As of 2018 the mechanism of action for its biological effects has not been determined.  Side-effects  Adverse reactions: Dry mouth, diarrhea, decreased appetite, fatigue, drowsiness, malaise, weakness, sleep disturbances, and others.  Drug interactions:  CBD may interact with medications such as blood-thinners. CBD causes drowsiness on its own and it will increase drowsiness caused by other medications, including antihistamines (such as Benadryl), benzodiazepines (Xanax , Ativan, Valium), antipsychotics, antidepressants, opioids, alcohol and supplements such as kava, melatonin and St. John's Wort.  Other  drug interactions: Brivaracetam (Briviact); Caffeine; Carbamazepine (Tegretol); Citalopram (Celexa); Clobazam (Onfi); Eslicarbazepine (Aptiom); Everolimus (Zostress); Lithium; Methadone  (Dolophine ); Rufinamide (Banzel); Sedative medications (CNS depressants); Sirolimus (Rapamune); Stiripentol (Diacomit); Tacrolimus (Prograf); Tamoxifen ; Soltamox); Topiramate (Topamax); Valproate; Warfarin (Coumadin); Zonisamide. (Last update: 09/13/2022) ______________________________________________________________________

## 2024-06-06 NOTE — Progress Notes (Signed)
 PROVIDER NOTE: Interpretation of information contained herein should be left to medically-trained personnel. Specific patient instructions are provided elsewhere under Patient Instructions section of medical record. This document was created in part using AI and STT-dictation technology, any transcriptional errors that may result from this process are unintentional.  Patient: Caleb Edwards  Service: E/M   PCP: Nola Hong, MD  DOB: August 29, 1951  DOS: 06/06/2024  Provider: Eric DELENA Como, MD  MRN: [id number]  Delivery: Face-to-face  Specialty: Interventional Pain Management  Type: Established Patient  Setting: Ambulatory outpatient facility  Specialty designation: 09  Referring Prov.: Nola Hong, MD  Location: Outpatient office facility       History of present illness (HPI) [name], a 73 y.o. year old male, is here today because of his Chronic pain syndrome [G89.4]. Mr. Veith primary complain today is Back Pain (Lower, right)  Pertinent problems: Mr. Lewallen has Osteoarthritis; Low back pain of over 3 months duration; Lumbar spondylosis; Chronic lower extremity pain (3ry area of Pain) (Bilateral) (R>L); Chronic lumbar radicular pain (Left) (L4 Dermatome); Lumbar facet syndrome (Bilateral) (R>L); Diffuse myofascial pain syndrome; L4-5 disc bulge; Lumbar foraminal stenosis (Bilateral) (L4-5) (L>R); Chronic neck pain; Cervical spondylosis; Chronic pain syndrome; Abdominal pain; Grade 1 Anterolisthesis (7-4mm) of L5 over S1; Lumbar facet arthropathy; Chronic hip pain (2ry area of Pain) (Bilateral) (R>L); Cervical radiculopathy (Left); Cervicalgia; DDD (degenerative disc disease), cervical; Abnormal x-ray of cervical spine; 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); Cervical facet arthropathy (Right: C4-5, C5-6); Grade 1 Anterolisthesis of cervical spine (1-2 mm) (C4/C5); Cervical foraminal stenosis (Right: C5-C8) (Left: C6-C7); Falls, sequela; Spondylosis without myelopathy or radiculopathy,  lumbosacral region; Cervical radiculopathy (Right); Cervical spondylosis with radiculopathy; Abnormal MRI, cervical spine (11/28/2020); 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  Pain Assessment: Severity of Chronic pain is reported as a 8 /10. Location: Back Right, Lower/both legs to the feet, worse in right side. Onset: More than a month ago. Quality: Constant. Timing: Constant. Modifying factor(s): nothing. Vitals:  height is 6' (1.829 m) and weight is 178 lb (80.7 kg). His temporal temperature is 96.9 F (36.1 C) (abnormal). His blood pressure is 165/92 (abnormal) and his pulse is 95. His respiration is 16 and oxygen saturation is 99%.  BMI: Estimated body mass index is 24.14 kg/m as calculated from the following:   Height as of this encounter: 6' (1.829 m).   Weight as of this encounter: 178 lb (80.7 kg).  Last encounter: 05/08/2024. Last procedure: 03/29/2024.  Reason for encounter: medication management.  The patient indicates doing well with the current medication regimen. No adverse reactions or side effects reported to the medications.    Discussed the use of AI scribe software for clinical note transcription with the patient, who gave verbal consent to proceed.  History of Present Illness   Caleb Edwards is a 73 year old male who presents for follow-up and medication management for chronic pain.  He experiences shooting pain on the right  side, extending from the back down behind the leg, ongoing for about three weeks. The pain is  more severe in the leg and radiates to the outside of the right foot. He also has weakness in the right leg, particularly in the morning.  He previously used tramadol  for pain management but discontinued it due to a positive urine drug screen for marijuana. He reports no side effects or adverse reactions to his current pain medication.      RTCB: 07/07/2024   Pharmacotherapy Assessment   Opioid Analgesic(s):  Tramadol  50 mg, 2 tabs PO q 6 hrs (400 mg/day of tramadol ). No more chronic opioid analgesics therapy prescribed by our practice. (07/20/2023) UDS: (+) unreported THC (Marijuana) Opioid analgesics stopped. MME/day: 40 mg/day.   Monitoring: [place] PMP: PDMP reviewed during this encounter.       Pharmacotherapy: No side-effects or adverse reactions reported. Compliance: No problems identified. Effectiveness: Clinically acceptable.  Shela Reda CROME, RN  06/06/2024  1:46 PM  Sign when Signing Visit Nursing Pain Medication Assessment:  Safety precautions to be maintained throughout the outpatient stay will include: orient to surroundings, keep bed in low position, maintain call bell within reach at all times, provide assistance with transfer out of bed and ambulation.  Medication Inspection Compliance: Pill count conducted under aseptic conditions, in front of the patient. Neither the pills nor the bottle was removed from the patient's sight at any time. Once count was completed pills were immediately returned to the patient in their original bottle.  Medication: Tramadol  (Ultram ) Pill/Patch Count: 20 of 240 pills/patches remain Pill/Patch Appearance: Markings consistent with prescribed medication Bottle Appearance: Standard pharmacy container. Clearly labeled. Filled Date: 08 / 05 / 2025 Last Medication intake:  Today    UDS:  Summary  Date Value Ref Range Status  04/17/2024 FINAL  Final     Comment:    ==================================================================== [employer] Select 13 (MW) ==================================================================== Test                             Result       Flag       Units  Drug Present not Declared for Prescription Verification   Hydrocodone                     803          UNEXPECTED ng/mg creat   Hydromorphone                  181          UNEXPECTED ng/mg creat   Dihydrocodeine                 60           UNEXPECTED ng/mg creat   Norhydrocodone                 438          UNEXPECTED ng/mg creat    Sources of hydrocodone  include scheduled prescription medications.    Hydromorphone, dihydrocodeine and norhydrocodone are expected    metabolites of hydrocodone . Hydromorphone and dihydrocodeine are    also available as scheduled prescription medications.    Oxycodone                       253          UNEXPECTED ng/mg creat   Oxymorphone                    270  UNEXPECTED ng/mg creat   Noroxycodone                   606          UNEXPECTED ng/mg creat   Noroxymorphone                 179          UNEXPECTED ng/mg creat    Sources of oxycodone  are scheduled prescription medications.    Oxymorphone, noroxycodone, and noroxymorphone are expected    metabolites of oxycodone . Oxymorphone is also available as a    scheduled prescription medication.  ==================================================================== Test                      Result    Flag   Units      Ref Range   Creatinine              97               mg/dL      >=79 ==================================================================== Declared Medications:  The flagging and interpretation on this report are based on the  following declared medications.  Unexpected results may arise from  inaccuracies in the declared medications.   **Note: The testing scope of this panel does not include the  following reported medications:   Acetaminophen   (Tylenol )  Amlodipine  (Norvasc )  Chlorthalidone  (Hygroton )  Cyclobenzaprine  (Flexeril )  Ibuprofen (Advil)  Iron  Lisinopril  (Zestril )  Naloxone  (Narcan )  Pantoprazole  (Protonix )  Rosuvastatin (Crestor)  Sildenafil (Viagra)  Topical Lidocaine  (Lidoderm )  Trazodone (Desyrel) ==================================================================== For clinical consultation, please call [phone]. ====================================================================     CBD:THC Ratio  Date Value Ref Range Status  04/17/2024 Negative RATIO Final    Comment:    INTERPRETATION: Neither CBD nor marijuana or marijuana/THC products detected in this sample.  This test measures Cannabidiol  (CBD) and Tetrahydrocannabinol (THC) and metabolites in urine. The CBD:THC ratio is calculated using the sums of the respective metabolites and is intended to assist in differentiating the presence of Tetrahydrocannabinol [facility]) metabolites due to the use of marijuana or medicinal THC from the presence of THC metabolites due to use of Cannabidiol  (CBD) or hemp products that purportedly contain trace amounts of THC.  CBD:THC Ratio          Interpretation ---------      --------------------------------------------- >=10.0         Consistent with the use of CBD products only 1.0 - 9.9      Indeterminate <1.0           Consistent with marijuana, medicinal THC or                mixed use  Interpretive ranges are provided as guidance and should not be considered definitive. Interpretation of results should include consideration of all relevant clinical and diagnostic information.  Analysis performed by chromatography with mass spectrometry. This test was developed and its performance characteristics determined by [facility].  It has not been cleared or approved by the Food and Drug Administration.    Carboxy-Delta-8-THC  Date Value Ref Range Status  04/17/2024 Not Detected ng/mL Final    Carboxy-Delta-9-THC  Date Value Ref Range Status  04/17/2024 Not Detected ng/mL Final    Comment:    Carboxy-Delta-9-THC is the primary metabolite of Delta-9- Tetrahydrocannabinol. Sources include the prescription medication Dronabinol as well as illicit, recreational, and medical marijuana and marijuana derived products of the same categories.  Carboxy-Delta-8-THC is the primary metabolite  of Delta-8- Tetrahydrocannabinol. Sources are products containing Delta- 8-THC, which is primarily chemically manufactured from cannabidiol  (CBD).  Testing Threshold = 2.0 ng/mL  Analysis performed by Liquid Chromatography with Tandem Mass Spectrometry (LC/MS/MS).  This test was developed and its performance characteristics determined by [facility].  It has not been cleared or approved by the Food and Drug Administration.     ROS  Constitutional: Denies any fever or chills Gastrointestinal: No reported hemesis, hematochezia, vomiting, or acute GI distress Musculoskeletal: Denies any acute onset joint swelling, redness, loss of ROM, or weakness Neurological: No reported episodes of acute onset apraxia, aphasia, dysarthria, agnosia, amnesia, paralysis, loss of coordination, or loss of consciousness  Medication Review  acetaminophen , amLODipine , chlorthalidone , cyclobenzaprine , ferrous sulfate , ibuprofen, lidocaine , lisinopril , naloxone , pantoprazole , rosuvastatin, sildenafil, traMADol , and traZODone  History Review  Allergy: Mr. Riese has no known allergies. Drug: Mr. Dehart  reports no history of drug use. Alcohol:  reports current alcohol use of about 1.0 standard drink of alcohol per week. Tobacco:  reports that he has quit smoking. He has been exposed to tobacco smoke. He has never used smokeless tobacco. Social: Mr. Cranshaw  reports that he has quit smoking. He has been exposed to tobacco smoke. He has never used smokeless tobacco. He reports current alcohol use of about 1.0 standard  drink of alcohol per week. He reports that he does not use drugs. Medical:  has a past medical history of Back pain, Blood transfusion without reported diagnosis (12/2015), Diverticulitis, Hypertension, Kidney stone, and Ulcer, stomach peptic, chronic. Surgical: Mr. Lohmeyer  has a past surgical history that includes Esophagogastroduodenoscopy (egd) with propofol  (N/A, 01/03/2016); Colonoscopy with propofol  (N/A, 08/02/2017); Esophagogastroduodenoscopy (egd) with propofol  (08/02/2017); Esophagogastroduodenoscopy (egd) with propofol  (N/A, 02/05/2018); Colonoscopy with propofol  (N/A, 02/05/2018); Esophagogastroduodenoscopy (egd) with propofol  (N/A, 03/30/2018); Esophagogastroduodenoscopy (egd) with propofol  (N/A, 08/09/2019); Colonoscopy with propofol  (N/A, 08/09/2019); Givens capsule study (N/A, 04/24/2020); Esophagogastroduodenoscopy (egd) with propofol  (N/A, 04/24/2020); and Maxillary antrostomy (Left, 12/10/2022). Family: family history includes Diabetes in his mother; Heart disease in his father; Prostate cancer in his brother.  Laboratory Chemistry Profile   Renal Lab Results  Component Value Date   BUN 14 01/19/2024   CREATININE 1.25 (H) 01/19/2024   GFRAA >60 02/21/2020   GFRNONAA >60 01/19/2024    Hepatic Lab Results  Component Value Date   AST 23 08/09/2019   ALT 15 08/09/2019   ALBUMIN 3.8 08/09/2019   ALKPHOS 53 08/09/2019   LIPASE 17 08/08/2019    Electrolytes Lab Results  Component Value Date   NA 137 01/19/2024   K 3.9 01/19/2024   CL 103 01/19/2024   CALCIUM  9.5 01/19/2024   MG 1.9 01/04/2016    Bone Lab Results  Component Value Date   VD25OH 24.0 (L) 12/26/2015    Inflammation (CRP: Acute Phase) (ESR: Chronic Phase) Lab Results  Component Value Date   CRP 0.6 12/26/2015   ESRSEDRATE 3 12/26/2015         Note: Above Lab results reviewed.  Recent Imaging Review  DG PAIN CLINIC C-ARM 1-60 MIN NO REPORT Fluoro was used, but no Radiologist interpretation will be provided.   Please refer to NOTES tab for provider progress note. Note: Reviewed        Physical Exam  Vitals: BP (!) 165/92 (Cuff Size: Normal)   Pulse 95   Temp (!) 96.9 F (36.1 C) (Temporal)   Resp 16   Ht 6' (1.829 m)   Wt 178 lb (80.7 kg)   SpO2 99%  BMI 24.14 kg/m  BMI: Estimated body mass index is 24.14 kg/m as calculated from the following:   Height as of this encounter: 6' (1.829 m).   Weight as of this encounter: 178 lb (80.7 kg). Ideal: Ideal body weight: 77.6 kg (171 lb 1.2 oz) Adjusted ideal body weight: 78.9 kg (173 lb 13.5 oz) General appearance: Well nourished, well developed, and well hydrated. In no apparent acute distress Mental status: Alert, oriented x 3 (person, place, & time)       Respiratory: No evidence of acute respiratory distress Eyes: PERLA   Assessment   Diagnosis Status  1. Chronic pain syndrome   2. Chronic lower extremity pain (3ry area of Pain) (Bilateral) (R>L)   3. Chronic low back pain (Bilateral) w/o sciatica   4. Kissing spine syndrome   5. Multifactorial low back pain   6. Low back pain of over 3 months duration   7. Chronic hip pain (2ry area of Pain) (Bilateral) (R>L)   8. Low back pain radiating to left leg   9. Pharmacologic therapy   10. Chronic use of opiate for therapeutic purpose   11. Abnormal drug screen (07/20/2023)   12. Marijuana use   13. Encounter for therapeutic drug level monitoring   14. Encounter for chronic pain management    Controlled Controlled Controlled   Updated Problems: No problems updated.  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 Symptoms have persisted for about three weeks and are worsening, with more severe pain in the leg than the back, suggesting S1  nerve root involvement. Consider L5 S1 epidural or caudal epidural to target the S1 nerve root if pain persists or worsens.  Chronic pain syndrome   Chronic pain syndrome is managed with tramadol . Pain management depends on compliance with the medication regimen and negative urine drug screens for illicit substances. He is informed that any future positive urine drug screen for illicit substances will result in permanent discontinuation of tramadol . Perform a urine drug screen to monitor for illicit substance use. Continue tramadol  prescription contingent on negative urine drug screen results. Increase prescription refill intervals to every three months, and potentially every six months, if compliance is maintained.  Cannabis use disorder, uncomplicated   He has a cannabis use disorder with a previous positive urine drug screen for marijuana. He is informed of the importance of abstaining from illicit substances to continue receiving pain management treatment. Reinforce the importance of abstaining from cannabis and other illicit substances. Conduct a urine drug screen to ensure compliance with the treatment plan.       Mr. REVIN CORKER has a current medication list which includes the following long-term medication(s): chlorthalidone , lisinopril , pantoprazole , rosuvastatin, trazodone, and [START ON 06/07/2024] tramadol .  Pharmacotherapy (Medications Ordered): Meds ordered this encounter  Medications   traMADol  (ULTRAM ) 50 MG tablet    Sig: Take 2 tablets (100 mg total) by mouth every 6 (six) hours as needed.    Dispense:  240 tablet    Refill:  0   Orders:  Orders Placed This Encounter  Procedures   Tox[employer]SURE Select 13 (MW), Urine    Volume: 30 ml(s). Minimum 3 ml of urine is needed. Document temperature of fresh sample. Indications: Long term (current) use of  opiate analgesic (S20.108)    Release to patient:   Immediate   Nursing Instructions:    1). STAT: UDS required today. 2).  Make sure to document all opioids and benzodiazepines taken, including time of last intake. 3). If order is entered on a procedure day, make sure sample is obtained before any medications are administered.   Nursing communication    Scheduling Instructions:     Complete/update the opioid risk tool (ORT) questionnaire.     Interventional Therapies  Risk Factors  Considerations:   Stage3 CKD  HTN  Hx GI Bleed  Anemia w/ near syncope           Planned  Pending:   Physical therapy ordered today (05/08/2024)  Therapeutic left L5-S1 LESI #2 + right L4 TFESI #4    Under consideration:   Therapeutic left L5-S1 LESI #2 + right L4 TFESI #4    Completed:   Therapeutic right L2-S1 medial branch lumbar facet RFA x1 (03/29/2024) (100/100/100/sensation of weakness in both legs)  Therapeutic left L2-S1 medial branch lumbar facet RFA x1 (03/13/2024) (100/100/50/50)  Therapeutic right L2-3 LESI x1 (08/18/2023) (80/80/60/60)  Diagnostic/therapeutic left L4 TFESI x4 + left L5-S1 LESI x1 (01/06/2023) (100/100/100/95) (LLEP:100  LBP:95)  Therapeutic midline C6-7 interspinous ligament inj. x1 (06/28/2022) (80/90/75/75)  Diagnostic bilateral lumbar facet MBB x3 (02/09/2024) (100/100/60 x 7 days/40)  Therapeutic left cervical ESI x2 (02/05/2021) (100/100/70 x 10 days/50)  Therapeutic right cervical ESI x1 (12/15/2021) (90/90/90/90)  Therapeutic left L3-4 LESI x2 (06/13/2018) (100/100/85/85)  Therapeutic left L3 TFESI x1 (03/13/2020) (95/95/60/>50)  Therapeutic left L4 TFESI x4 (01/06/2023) (100/100/100/95)  Therapeutic right L3 TFESI x2 (03/13/2020) (95/95/60/>50)  Therapeutic right L4 TFESI x3 (03/13/2020) (95/95/60/>50)  Referral to neurosurgery Camilla Nine, MD) for cervical instability (11/11/2020)    Therapeutic  Palliative (PRN) options:   Evaluation needed prior to procedures.     Return in about 31 days (around 07/07/2024) for Eval-day (M,W), (Face2F), (MM), w/ Emmy Blanch, NP.     Recent Visits Date Type Provider Dept  05/08/24 Office Visit Tanya Glisson, MD [facility]  04/17/24 Office Visit Tanya Glisson, MD [facility]  04/05/24 Office Visit Tanya Glisson, MD [facility]  03/29/24 Procedure visit Tanya Glisson, MD [facility]  03/13/24 Procedure visit Tanya Glisson, MD [facility]  Showing recent visits within past 90 days and meeting all other requirements Today's Visits Date Type Provider Dept  06/06/24 Office Visit Tanya Glisson, MD [facility]  Showing today's visits and meeting all other requirements Future Appointments Date Type Provider Dept  07/31/24 Appointment Patel, Seema K, NP [facility]  Showing future appointments within next 90 days and meeting all other requirements  I discussed the assessment and treatment plan with the patient. The patient was provided an opportunity to ask questions and all were answered. The patient agreed with the plan and demonstrated an understanding of the instructions.  Patient advised to call back or seek an in-person evaluation if the symptoms or condition worsens.  Duration of encounter: 30 minutes.  Total time on encounter, as per AMA guidelines included both the face-to-face and non-face-to-face time personally spent by the physician and/or other qualified health care professional(s) on the day of the encounter (includes time in activities that require the physician or other qualified health care professional and does not include time in activities normally performed by clinical staff). Physician's time may include the following activities when performed: Preparing to see the patient (e.g., pre-charting  review of records, searching for previously ordered imaging, lab work, and nerve conduction tests) Review of prior analgesic pharmacotherapies. Reviewing PMP Interpreting ordered tests (e.g., lab work, imaging,  nerve conduction tests) Performing post-procedure evaluations, including interpretation of diagnostic procedures Obtaining and/or reviewing separately obtained history Performing a medically appropriate examination and/or evaluation Counseling and educating the patient/family/caregiver Ordering medications, tests, or procedures Referring and communicating with other health care professionals (when not separately reported) Documenting clinical information in the electronic or other health record Independently interpreting results (not separately reported) and communicating results to the patient/ family/caregiver Care coordination (not separately reported)  Note by: Eric DELENA Como, MD (TTS and AI technology used. I apologize for any typographical errors that were not detected and corrected.) Date: 06/06/2024; Time: 2:07 PM

## 2024-06-06 NOTE — Progress Notes (Signed)
 Nursing Pain Medication Assessment:  Safety precautions to be maintained throughout the outpatient stay will include: orient to surroundings, keep bed in low position, maintain call bell within reach at all times, provide assistance with transfer out of bed and ambulation.  Medication Inspection Compliance: Pill count conducted under aseptic conditions, in front of the patient. Neither the pills nor the bottle was removed from the patient's sight at any time. Once count was completed pills were immediately returned to the patient in their original bottle.  Medication: Tramadol  (Ultram ) Pill/Patch Count: 20 of 240 pills/patches remain Pill/Patch Appearance: Markings consistent with prescribed medication Bottle Appearance: Standard pharmacy container. Clearly labeled. Filled Date: 08 / 05 / 2025 Last Medication intake:  Today

## 2024-06-11 LAB — TOXASSURE SELECT 13 (MW), URINE

## 2024-07-02 ENCOUNTER — Ambulatory Visit: Payer: Medicare (Managed Care) | Attending: Nurse Practitioner | Admitting: Nurse Practitioner

## 2024-07-02 ENCOUNTER — Encounter: Payer: Self-pay | Admitting: Nurse Practitioner

## 2024-07-02 VITALS — BP 142/79 | HR 74 | Temp 98.1°F | Resp 18 | Ht 72.0 in | Wt 178.0 lb

## 2024-07-02 DIAGNOSIS — G894 Chronic pain syndrome: Secondary | ICD-10-CM | POA: Insufficient documentation

## 2024-07-02 DIAGNOSIS — M79604 Pain in right leg: Secondary | ICD-10-CM | POA: Insufficient documentation

## 2024-07-02 DIAGNOSIS — Z79891 Long term (current) use of opiate analgesic: Secondary | ICD-10-CM | POA: Insufficient documentation

## 2024-07-02 DIAGNOSIS — F1120 Opioid dependence, uncomplicated: Secondary | ICD-10-CM | POA: Diagnosis present

## 2024-07-02 DIAGNOSIS — M79605 Pain in left leg: Secondary | ICD-10-CM | POA: Insufficient documentation

## 2024-07-02 DIAGNOSIS — G8929 Other chronic pain: Secondary | ICD-10-CM | POA: Diagnosis present

## 2024-07-02 DIAGNOSIS — M545 Low back pain, unspecified: Secondary | ICD-10-CM | POA: Insufficient documentation

## 2024-07-02 MED ORDER — NALOXONE HCL 4 MG/0.1ML NA LIQD: 1.0000 | NASAL | 0 refills | Status: AC | PRN

## 2024-07-02 MED ORDER — TRAMADOL HCL 50 MG PO TABS: 100.0000 mg | ORAL_TABLET | Freq: Four times a day (QID) | ORAL | 0 refills | Status: DC | PRN

## 2024-07-02 NOTE — Progress Notes (Signed)
 Nursing Pain Medication Assessment:  Safety precautions to be maintained throughout the outpatient stay will include: orient to surroundings, keep bed in low position, maintain call bell within reach at all times, provide assistance with transfer out of bed and ambulation.  Medication Inspection Compliance: Pill count conducted under aseptic conditions, in front of the patient. Neither the pills nor the bottle was removed from the patient's sight at any time. Once count was completed pills were immediately returned to the patient in their original bottle.  Medication: Tramadol  (Ultram ) Pill/Patch Count: 79 of 240 pills/patches remain Pill/Patch Appearance: Markings consistent with prescribed medication Bottle Appearance: Standard pharmacy container. Clearly labeled. Filled Date: 09 / 03 / 2025 Last Medication intake:  Today

## 2024-07-02 NOTE — Progress Notes (Addendum)
 PROVIDER NOTE: Interpretation of information contained herein should be left to medically-trained personnel. Specific patient instructions are provided elsewhere under Patient Instructions section of medical record. This document was created in part using AI and STT-dictation technology, any transcriptional errors that may result from this process are unintentional.  Patient: Caleb Edwards  Service: E/M   PCP: Nola Hong, MD  DOB: 04/29/1951  DOS: 07/02/2024  Provider: Emmy MARLA Blanch, NP  MRN: [id number]  Delivery: Face-to-face  Specialty: Interventional Pain Management  Type: Established Patient  Setting: Ambulatory outpatient facility  Specialty designation: 09  Referring Prov.: Nola Hong, MD  Location: Outpatient office facility       History of present illness (HPI) Caleb Edwards, a 73 y.o. year old male, is here today because of his low back pain (worse on right side then left side). 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  Pain Assessment: Severity of Chronic pain is reported as a 8 /10. Location: Back Right/Down right leg, numbness and tingling. Onset: More than a month ago. Quality: Tingling, Numbness. Timing: Constant. Modifying factor(s): Medication. Vitals:  height is 6' (1.829 m) and weight is 178 lb (80.7 kg). His temporal temperature is 98.1 F (36.7 C). His blood pressure is 142/79 (abnormal) and his pulse is 74. His respiration is 18 and oxygen saturation is  100%.  BMI: Estimated body mass index is 24.14 kg/m as calculated from the following:   Height as of this encounter: 6' (1.829 m).   Weight as of this encounter: 178 lb (80.7 kg).  Last encounter: 06/06/2024 Last procedure: 03/29/2024  Reason for encounter: medication management.  The patient indicates doing well with current medication regimen.  No side effects adverse reaction reported to medication.  The patient continues to experience low back pain, more pronounced on the right side than the left.  The pain radiates down to the right leg to the lateral aspect of the right foot and is associated with numbness and tingling. Pharmacotherapy Assessment   Tramadol  (Ultram ) 50 mg tablet (100 mg total), 2 tablets by mouth every 6 hours as needed for pain. MME=80 Monitoring: [place] PMP: PDMP reviewed during this encounter.       Pharmacotherapy: No side-effects or adverse reactions reported. Compliance: No problems identified. Effectiveness: Clinically acceptable.  Erlene Doyal SAUN[place], [place]  07/02/2024  9:06 AM  Sign when Signing Visit Nursing Pain Medication Assessment:  Safety precautions to be maintained throughout the outpatient stay will include: orient to surroundings, keep bed in low position, maintain call bell within reach at all times, provide assistance with transfer out of bed and ambulation.  Medication Inspection Compliance: Pill count conducted under aseptic conditions, in front of the patient. Neither the pills nor the bottle was removed from the patient's sight at any time. Once count was completed pills were immediately returned to the patient in their original bottle.  Medication: Tramadol  (Ultram ) Pill/Patch Count: 79 of 240 pills/patches remain Pill/Patch Appearance: Markings consistent with prescribed medication Bottle Appearance: Standard pharmacy container. Clearly labeled. Filled Date: 09 / 03 / 2025 Last  Medication intake:  Today    UDS:  Summary  Date Value Ref Range  Status  06/07/2024 FINAL  Final    Comment:    ==================================================================== Tox[employer] Select 13 (MW) ==================================================================== Test                             Result       Flag       Units  Drug Present and Declared for Prescription Verification   Tramadol                        >4545        EXPECTED   ng/mg creat   O-Desmethyltramadol            >4545        EXPECTED   ng/mg creat   N-Desmethyltramadol            1520         EXPECTED   ng/mg creat    Source of tramadol  is a prescription medication. O-desmethyltramadol    and N-desmethyltramadol are expected metabolites of tramadol .  ==================================================================== Test                      Result    Flag   Units      Ref Range   Creatinine              110              mg/dL      >=79 ==================================================================== Declared Medications:  The flagging and interpretation on this report are based on the  following declared medications.  Unexpected results may arise from  inaccuracies in the declared medications.   **Note: The testing scope of this panel includes these medications:   Tramadol  (Ultram )   **Note: The testing scope of this panel does not include the  following reported medications:   Acetaminophen  (Tylenol )  Amlodipine  (Norvasc )  Chlorthalidone  (Hygroton )  Cyclobenzaprine  (Flexeril )  Ibuprofen (Advil)  Iron  Lisinopril  (Zestril )  Naloxone  (Narcan )  Pantoprazole  (Protonix )  Rosuvastatin (Crestor)  Sildenafil (Viagra)  Topical Lidocaine   Trazodone (Desyrel) ==================================================================== For clinical consultation, please call [phone]. ====================================================================     CBD:THC Ratio  Date Value Ref Range Status  04/17/2024 Negative RATIO Final    Comment:     INTERPRETATION: Neither CBD nor marijuana or marijuana/THC products detected in this sample.  This test measures Cannabidiol  (CBD) and Tetrahydrocannabinol (THC) and metabolites in urine. The CBD:THC ratio is calculated using the sums of the respective metabolites and is intended to assist in differentiating the presence of Tetrahydrocannabinol [facility]) metabolites due to the use of marijuana or medicinal THC from the presence of THC metabolites due to use of Cannabidiol  (CBD) or hemp products that purportedly contain trace amounts of THC.  CBD:THC Ratio          Interpretation ---------      --------------------------------------------- >=10.0         Consistent with the use of CBD products only 1.0 - 9.9      Indeterminate <1.0           Consistent with marijuana, medicinal THC or                mixed use  Interpretive ranges are provided as guidance and should not be considered definitive. Interpretation of results should include consideration of all relevant clinical and diagnostic  information.  Analysis performed by chromatography with mass spectrometry. This test was developed and its performance characteristics determined by [facility].  It has not been cleared or approved by the Food and Drug Administration.    Carboxy-Delta-8-THC  Date Value Ref Range Status  04/17/2024 Not Detected ng/mL Final   Carboxy-Delta-9-THC  Date Value Ref Range Status  04/17/2024 Not Detected ng/mL Final    Comment:    Carboxy-Delta-9-THC is the primary metabolite of Delta-9- Tetrahydrocannabinol. Sources include the prescription medication Dronabinol as well as illicit, recreational, and medical marijuana and marijuana derived products of the same categories.  Carboxy-Delta-8-THC is the primary metabolite of Delta-8- Tetrahydrocannabinol. Sources are products containing Delta- 8-THC, which is primarily chemically manufactured from cannabidiol  (CBD).  Testing Threshold = 2.0  ng/mL  Analysis performed by Liquid Chromatography with Tandem Mass Spectrometry (LC/MS/MS).  This test was developed and its performance characteristics determined by [facility].  It has not been cleared or approved by the Food and Drug Administration.     ROS  Constitutional: Denies any fever or chills Gastrointestinal: No reported hemesis, hematochezia, vomiting, or acute GI distress Musculoskeletal: Denies any acute onset joint swelling, redness, loss of ROM, or weakness Neurological: No reported episodes of acute onset apraxia, aphasia, dysarthria, agnosia, amnesia, paralysis, loss of coordination, or loss of consciousness  Medication Review  acetaminophen , amLODipine , chlorthalidone , cyclobenzaprine , ferrous sulfate , ibuprofen, lidocaine , lisinopril , naloxone , pantoprazole , rosuvastatin, sildenafil, traMADol , and traZODone  History Review  Allergy: Caleb Edwards has no known allergies. Drug: Caleb Edwards  reports no history of drug use. Alcohol:  reports current alcohol use of about 1.0 standard drink of alcohol per week. Tobacco:  reports that he has quit smoking. He has been exposed to tobacco smoke. He has never used smokeless tobacco. Social: Caleb Edwards  reports that he has quit smoking. He has been exposed to tobacco smoke. He has never used smokeless tobacco. He reports current alcohol use of about 1.0 standard drink of alcohol per week. He reports that he does not use drugs. Medical:  has a past medical history of Back pain, Blood transfusion without reported diagnosis (12/2015), Diverticulitis, Hypertension, Kidney stone, and Ulcer, stomach peptic, chronic. Surgical: Caleb Edwards  has a past surgical history that includes Esophagogastroduodenoscopy (egd) with propofol  (N/A, 01/03/2016); Colonoscopy with propofol  (N/A, 08/02/2017); Esophagogastroduodenoscopy (egd) with propofol  (08/02/2017); Esophagogastroduodenoscopy (egd) with propofol  (N/A, 02/05/2018); Colonoscopy with propofol  (N/A,  02/05/2018); Esophagogastroduodenoscopy (egd) with propofol  (N/A, 03/30/2018); Esophagogastroduodenoscopy (egd) with propofol  (N/A, 08/09/2019); Colonoscopy with propofol  (N/A, 08/09/2019); Givens capsule study (N/A, 04/24/2020); Esophagogastroduodenoscopy (egd) with propofol  (N/A, 04/24/2020); and Maxillary antrostomy (Left, 12/10/2022). Family: family history includes Diabetes in his mother; Heart disease in his father; Prostate cancer in his brother.  Laboratory Chemistry Profile   Renal Lab Results  Component Value Date   BUN 14 01/19/2024   CREATININE 1.25 (H) 01/19/2024   GFRAA >60 02/21/2020   GFRNONAA >60 01/19/2024    Hepatic Lab Results  Component Value Date   AST 23 08/09/2019   ALT 15 08/09/2019   ALBUMIN 3.8 08/09/2019   ALKPHOS 53 08/09/2019   LIPASE 17 08/08/2019    Electrolytes Lab Results  Component Value Date   NA 137 01/19/2024   K 3.9 01/19/2024   CL 103 01/19/2024   CALCIUM  9.5 01/19/2024   MG 1.9 01/04/2016    Bone Lab Results  Component Value Date   VD25OH 24.0 (L) 12/26/2015    Inflammation (CRP: Acute Phase) (ESR: Chronic Phase) Lab Results  Component Value Date   CRP  0.6 12/26/2015   ESRSEDRATE 3 12/26/2015         Note: Above Lab results reviewed.  Recent Imaging Review  DG PAIN CLINIC C-ARM 1-60 MIN NO REPORT Fluoro was used, but no Radiologist interpretation will be provided.  Please refer to NOTES tab for provider progress note. Note: Reviewed        Physical Exam  Vitals: BP (!) 142/79 (BP Location: Right Arm, Patient Position: Sitting, Cuff Size: Normal)   Pulse 74   Temp 98.1 F (36.7 C) (Temporal)   Resp 18   Ht 6' (1.829 m)   Wt 178 lb (80.7 kg)   SpO2 100%   BMI 24.14 kg/m  BMI: Estimated body mass index is 24.14 kg/m as calculated from the following:   Height as of this encounter: 6' (1.829 m).   Weight as of this encounter: 178 lb (80.7 kg). Ideal: Ideal body weight: 77.6 kg (171 lb 1.2 oz) Adjusted ideal body weight:  78.9 kg (173 lb 13.5 oz) General appearance: Well nourished, well developed, and well hydrated. In no apparent acute distress Mental status: Alert, oriented x 3 (person, place, & time)       Respiratory: No evidence of acute respiratory distress Eyes: PERLA  Musculoskeletal: + Low back pain (R>L) radiate down to the right leg to the lateral aspect of the right foot with numbness and tingling  Assessment   Diagnosis Status  1. Chronic low back pain (Bilateral) w/o sciatica   2. Multifactorial low back pain   3. Chronic pain syndrome   4. Chronic lower extremity pain (3ry area of Pain) (Bilateral) (R>L)   5. Chronic use of opiate for therapeutic purpose   6. Uncomplicated opioid dependence (HCC)   7. Low back pain of over 3 months duration    Having a Flare-up Controlled Stable   Updated Problems: No problems updated.  Plan of Care  Problem-specific:  Assessment and Plan  Multifactorial low back pain (Controlled) The patient continues to experience low back pain (R>L) radiate down to the right leg to the lateral aspect of the right foot with numbness and tingling; however current pain medication regimen (tramadol ) helped to manage pain level and functional ability.  The patient was advised to schedule a visit with Dr. Naveira for evaluation of potential interventional therapies in the future and will continue use tramadol  as prescribed.   Chronic low back pain (Bilateral) w/o sciatica: The patient previously underwent lumbar radiofrequency ablation with excellent result.  However, he is currently experiencing a flareup of pain, more pronounced on the right side, accompanied by numbness along the lateral aspect of the right foot.  Chronic pain syndrome: Patient's pain is controlled with tramadol , will continue on current medication regimen. Routine UDS ordered today to verify THC in system before continues with pain medication regimen.  Prescribing drug monitoring (PMP) reviewed;  findings consistent with the use of prescribed medication and no evidence of narcotic misuse or abuse.   Mr. RAPHAEL FITZPATRICK has a current medication list which includes the following long-term medication(s): chlorthalidone , lisinopril , pantoprazole , rosuvastatin, trazodone, and tramadol .  Pharmacotherapy (Medications Ordered): Meds ordered this encounter  Medications   traMADol  (ULTRAM ) 50 MG tablet    Sig: Take 2 tablets (100 mg total) by mouth every 6 (six) hours as needed.    Dispense:  240 tablet    Refill:  0   naloxone  (NARCAN ) nasal spray 4 mg/0.1 mL    Sig: Place 1 spray into the nose as needed for  up to 365 doses (for opioid-induced respiratory depresssion). In case of emergency (overdose), spray once into each nostril. If no response within 3 minutes, repeat application and call 911.    Dispense:  1 each    Refill:  0    Instruct patient in proper use of device.   Orders:  Orders Placed This Encounter  Procedures   ToxASSURE Select 13 (MW), Urine    Volume: 30 ml(s). Minimum 3 ml of urine is needed. Document temperature of fresh sample. Indications: Long term (current) use of opiate analgesic (S20.108)    Release to patient:   Immediate        Return in about 1 month (around 08/01/2024) for (F2F), (MM), Emmy Blanch NP.    Recent Visits Date Type Provider Dept  06/06/24 Office Visit Tanya Glisson, MD [facility]  05/08/24 Office Visit Tanya Glisson, MD [facility]  04/17/24 Office Visit Tanya Glisson, MD [facility]  04/05/24 Office Visit Tanya Glisson, MD [facility]  Showing recent visits within past 90 days and meeting all other requirements Today's Visits Date Type Provider Dept  07/02/24 Office Visit Maeven Mcdougall K, NP [facility]  Showing today's visits and meeting all other requirements Future Appointments Date Type Provider Dept  07/31/24 Appointment Keimon Basaldua K, NP [facility]  Showing future appointments within next 90 days and meeting all other requirements  I discussed the assessment and treatment plan with the patient. The patient was provided an opportunity to ask questions and all were answered. The patient agreed with the plan and demonstrated an understanding of the instructions.  Patient advised to call back or seek an in-person evaluation if the symptoms or condition worsens. I personally spent a total of 30 minutes in the care of the patient today including preparing to see the patient, getting/reviewing separately obtained history, performing a medically appropriate exam/evaluation, counseling and educating, placing orders, referring and communicating with other health care professionals, documenting clinical information in the EHR, independently interpreting results, communicating results, and coordinating care.  Note by: Emmy MARLA Blanch, NP  Date: 07/02/2024; Time: 11:16 AM

## 2024-07-05 LAB — TOXASSURE SELECT 13 (MW), URINE

## 2024-07-31 ENCOUNTER — Encounter: Payer: Self-pay | Admitting: Nurse Practitioner

## 2024-07-31 ENCOUNTER — Encounter: Payer: Medicare (Managed Care) | Admitting: Nurse Practitioner

## 2024-07-31 ENCOUNTER — Ambulatory Visit: Payer: Medicare (Managed Care) | Attending: Nurse Practitioner | Admitting: Nurse Practitioner

## 2024-07-31 VITALS — BP 145/91 | HR 87 | Temp 97.6°F | Ht 72.0 in | Wt 175.0 lb

## 2024-07-31 DIAGNOSIS — Z79899 Other long term (current) drug therapy: Secondary | ICD-10-CM | POA: Insufficient documentation

## 2024-07-31 DIAGNOSIS — G894 Chronic pain syndrome: Secondary | ICD-10-CM | POA: Diagnosis not present

## 2024-07-31 DIAGNOSIS — Z79891 Long term (current) use of opiate analgesic: Secondary | ICD-10-CM | POA: Insufficient documentation

## 2024-07-31 DIAGNOSIS — M545 Low back pain, unspecified: Secondary | ICD-10-CM | POA: Diagnosis present

## 2024-07-31 MED ORDER — TRAMADOL HCL 50 MG PO TABS: 100.0000 mg | ORAL_TABLET | Freq: Four times a day (QID) | ORAL | 0 refills | Status: DC | PRN

## 2024-07-31 NOTE — Progress Notes (Signed)
 PROVIDER NOTE: Interpretation of information contained herein should be left to medically-trained personnel. Specific patient instructions are provided elsewhere under Patient Instructions section of medical record. This document was created in part using AI and STT-dictation technology, any transcriptional errors that may result from this process are unintentional.  Patient: Caleb Edwards  Service: E/M   PCP: Nola Hong, MD  DOB: July 14, 1951  DOS: 07/31/2024  Provider: Emmy MARLA Blanch, NP  MRN: [id number]  Delivery: Face-to-face  Specialty: Interventional Pain Management  Type: Established Patient  Setting: Ambulatory outpatient facility  Specialty designation: 09  Referring Prov.: Nola Hong, MD  Location: Outpatient office facility       History of present illness (HPI) [name], a 73 y.o. year old male, is here today because of his Multifactorial low back pain [M54.50]. 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  Pain Assessment: Severity of Chronic pain is reported as a 8 /10. Location: Back Lower, Right/radiates down right leg to top of right foot. Onset: More than a month ago. Quality: Tingling. Timing: Constant. Modifying factor(s): denies. Vitals:  height is 6' (1.829 m) and weight is 175 lb (79.4 kg). His temperature is 97.6 F (36.4 C). His blood pressure is 145/91 (abnormal) and his pulse is 87. His oxygen saturation is 100%.  BMI: Estimated body mass index is 23.73 kg/m as calculated from  the following:   Height as of this encounter: 6' (1.829 m).   Weight as of this encounter: 175 lb (79.4 kg).  Last encounter: 07/02/2024. Last procedure: Visit date not found.  Reason for encounter: medication management. The patient indicates doing well with current medication regimen. No side effects adverse reaction reported to medication. The patient continues to experience low back pain, more pronounced on the right side than the left. The pain radiates down to the right leg to the lateral aspect of the right foot and down to top of the foot, associated with numbness and tingling.   Discussed the use of AI scribe software for clinical note transcription with the patient, who gave verbal consent to proceed.  History of Present Illness   Caleb Edwards is a 73 year old male who presents for pain management follow-up. He experiences pain primarily in the lower back, specifically on the right side, radiating down to the top of the foot. He is currently on tramadol  for pain management, with a prescription of 240 tablets per month. No side effects from the medication.  The patient was advised to bring pill bottle for pill count at next visit in order to continue with the pain management.     Pharmacotherapy Assessment   Tramadol  (Ultram ) 50 mg tablet (100 mg total), 2 tablets by mouth every 6 hours as needed for pain. MME=80 Monitoring: [place] PMP: PDMP reviewed during this encounter.       Pharmacotherapy: No side-effects or adverse reactions reported. Compliance: No problems identified. Effectiveness: Clinically acceptable.  Caleb Nathanel JINNY, RN  07/31/2024 11:28 AM  Sign when Signing Visit Safety precautions to be maintained throughout the outpatient stay will include: orient to  surroundings, keep bed in low position, maintain call bell within reach at all times, provide assistance with transfer out of bed and ambulation.   Nursing Pain Medication Assessment:  Safety precautions to be  maintained throughout the outpatient stay will include: orient to surroundings, keep bed in low position, maintain call bell within reach at all times, provide assistance with transfer out of bed and ambulation.  Medication Inspection Compliance: Caleb Edwards did not comply with our request to bring his pills to be counted. He was reminded that bringing the medication bottles, even when empty, is a requirement.  Medication: Tramadol  (Ultram ) Pill/Patch Count: No pills/patches available to be counted. Pill/Patch Appearance: No markings Bottle Appearance: No container available. Did not bring bottle(s) to appointment. Filled Date: no bottle / no bottle / 2025 Last Medication intake:  Today  INstructed pt to always bring bottle to appt    UDS:  Summary  Date Value Ref Range Status  07/02/2024 FINAL  Final    Comment:    ==================================================================== [facility] 13 (MW) ==================================================================== Test                             Result       Flag       Units  Drug Present and Declared for Prescription Verification   Tramadol                        >5952        EXPECTED   ng/mg creat   O-Desmethyltramadol            >5952        EXPECTED   ng/mg creat   N-Desmethyltramadol            1443         EXPECTED   ng/mg creat    Source of tramadol  is a prescription medication. O-desmethyltramadol    and N-desmethyltramadol are expected metabolites of tramadol .  ==================================================================== Test                      Result    Flag   Units      Ref Range   Creatinine              84               mg/dL      >=79 ==================================================================== Declared Medications:  The flagging and interpretation on this report are based on the  following declared medications.  Unexpected results may arise from  inaccuracies in the declared  medications.   **Note: The testing scope of this panel includes these medications:   Tramadol    **Note: The testing scope of this panel does not include the  following reported medications:   Acetaminophen   Amlodipine  (Norvasc )  Chlorthalidone  (Hygroton )  Cyclobenzaprine  (Flexeril )  Ibuprofen  Iron  Lidocaine   Lisinopril  (Zestril )  Naloxone  (Narcan )  Pantoprazole  (Protonix )  Rosuvastatin (Crestor)  Sildenafil (Viagra)  Trazodone ==================================================================== For clinical consultation, please call [phone]. ====================================================================     CBD:THC Ratio  Date Value Ref Range Status  04/17/2024 Negative RATIO Final    Comment:    INTERPRETATION: Neither CBD nor marijuana or marijuana/THC products detected in this sample.  This test measures Cannabidiol  (CBD) and Tetrahydrocannabinol (THC) and metabolites in urine. The CBD:THC ratio is calculated using the sums of the respective metabolites and is intended to assist in differentiating the  presence of Tetrahydrocannabinol (THC) metabolites due to the use of marijuana or medicinal THC from the presence of THC metabolites due to use of Cannabidiol  (CBD) or hemp products that purportedly contain trace amounts of THC.  CBD:THC Ratio          Interpretation ---------      --------------------------------------------- >=10.0         Consistent with the use of CBD products only 1.0 - 9.9      Indeterminate <1.0           Consistent with marijuana, medicinal THC or                mixed use  Interpretive ranges are provided as guidance and should not be considered definitive. Interpretation of results should include consideration of all relevant clinical and diagnostic information.  Analysis performed by chromatography with mass spectrometry. This test was developed and its performance characteristics determined by [facility].  It has not  been cleared or approved by the Food and Drug Administration.    Carboxy-Delta-8-THC  Date Value Ref Range Status  04/17/2024 Not Detected ng/mL Final   Carboxy-Delta-9-THC  Date Value Ref Range Status  04/17/2024 Not Detected ng/mL Final    Comment:    Carboxy-Delta-9-THC is the primary metabolite of Delta-9- Tetrahydrocannabinol. Sources include the prescription medication Dronabinol as well as illicit, recreational, and medical marijuana and marijuana derived products of the same categories.  Carboxy-Delta-8-THC is the primary metabolite of Delta-8- Tetrahydrocannabinol. Sources are products containing Delta- 8-THC, which is primarily chemically manufactured from cannabidiol  (CBD).  Testing Threshold = 2.0 ng/mL  Analysis performed by Liquid Chromatography with Tandem Mass Spectrometry (LC/MS/MS).  This test was developed and its performance characteristics determined by [facility].  It has not been cleared or approved by the Food and Drug Administration.     ROS  Constitutional: Denies any fever or chills Gastrointestinal: No reported hemesis, hematochezia, vomiting, or acute GI distress Musculoskeletal: lower back, specifically on the right side, radiating down to the top of the foot Neurological: No reported episodes of acute onset apraxia, aphasia, dysarthria, agnosia, amnesia, paralysis, loss of coordination, or loss of consciousness  Medication Review  acetaminophen , amLODipine , chlorthalidone , cyclobenzaprine , ferrous sulfate , ibuprofen, lidocaine , lisinopril , naloxone , pantoprazole , rosuvastatin, sildenafil, traMADol , and traZODone  History Review  Allergy: Caleb Edwards has no known allergies. Drug: Caleb Edwards  reports no history of drug use. Alcohol:  reports current alcohol use of about 1.0 standard drink of alcohol per week. Tobacco:  reports that he has quit smoking. He has been exposed to tobacco smoke. He has never used smokeless tobacco. Social: Caleb Edwards   reports that he has quit smoking. He has been exposed to tobacco smoke. He has never used smokeless tobacco. He reports current alcohol use of about 1.0 standard drink of alcohol per week. He reports that he does not use drugs. Medical:  has a past medical history of Back pain, Blood transfusion without reported diagnosis (12/2015), Diverticulitis, Hypertension, Kidney stone, and Ulcer, stomach peptic, chronic. Surgical: Caleb Edwards  has a past surgical history that includes Esophagogastroduodenoscopy (egd) with propofol  (N/A, 01/03/2016); Colonoscopy with propofol  (N/A, 08/02/2017); Esophagogastroduodenoscopy (egd) with propofol  (08/02/2017); Esophagogastroduodenoscopy (egd) with propofol  (N/A, 02/05/2018); Colonoscopy with propofol  (N/A, 02/05/2018); Esophagogastroduodenoscopy (egd) with propofol  (N/A, 03/30/2018); Esophagogastroduodenoscopy (egd) with propofol  (N/A, 08/09/2019); Colonoscopy with propofol  (N/A, 08/09/2019); Givens capsule study (N/A, 04/24/2020); Esophagogastroduodenoscopy (egd) with propofol  (N/A, 04/24/2020); and Maxillary antrostomy (Left, 12/10/2022). Family: family history includes Diabetes in his mother; Heart disease in his father;  Prostate cancer in his brother.  Laboratory Chemistry Profile   Renal Lab Results  Component Value Date   BUN 14 01/19/2024   CREATININE 1.25 (H) 01/19/2024   GFRAA >60 02/21/2020   GFRNONAA >60 01/19/2024    Hepatic Lab Results  Component Value Date   AST 23 08/09/2019   ALT 15 08/09/2019   ALBUMIN 3.8 08/09/2019   ALKPHOS 53 08/09/2019   LIPASE 17 08/08/2019    Electrolytes Lab Results  Component Value Date   NA 137 01/19/2024   K 3.9 01/19/2024   CL 103 01/19/2024   CALCIUM  9.5 01/19/2024   MG 1.9 01/04/2016    Bone Lab Results  Component Value Date   VD25OH 24.0 (L) 12/26/2015    Inflammation (CRP: Acute Phase) (ESR: Chronic Phase) Lab Results  Component Value Date   CRP 0.6 12/26/2015   ESRSEDRATE 3 12/26/2015         Note:  Above Lab results reviewed.  Recent Imaging Review  DG PAIN CLINIC C-ARM 1-60 MIN NO REPORT Fluoro was used, but no Radiologist interpretation will be provided.  Please refer to NOTES tab for provider progress note. Note: Reviewed        Physical Exam  Vitals: BP (!) 145/91   Pulse 87   Temp 97.6 F (36.4 C)   Ht 6' (1.829 m)   Wt 175 lb (79.4 kg)   SpO2 100%   BMI 23.73 kg/m  BMI: Estimated body mass index is 23.73 kg/m as calculated from the following:   Height as of this encounter: 6' (1.829 m).   Weight as of this encounter: 175 lb (79.4 kg). Ideal: Ideal body weight: 77.6 kg (171 lb 1.2 oz) Adjusted ideal body weight: 78.3 kg (172 lb 10.3 oz) General appearance: Well nourished, well developed, and well hydrated. In no apparent acute distress Mental status: Alert, oriented x 3 (person, place, & time)       Respiratory: No evidence of acute respiratory distress Eyes: PERLA  Musculoskeletal: +LBP Assessment   Diagnosis Status  1. Multifactorial low back pain   2. Chronic pain syndrome   3. Chronic use of opiate for therapeutic purpose   4. Medication management    Controlled Controlled Controlled   Updated Problems: No problems updated.  Plan of Care  Problem-specific:  Assessment and Plan    Chronic right-sided low back pain syndrome Chronic pain managed with tramadol , no side effects reported. - Prescribed tramadol  for one month. - Instructed to bring pill bottle for medication count at next visit. The patient previously underwent lumbar radiofrequency ablation with excellent result. However, he is currently experiencing a intermittent flare up of pain, more pronounced on the right side, accompanied by numbness along the lateral aspect of the right foot.   Multifactorial low back pain (Controlled) The patient continues to experience low back pain (R>L) radiate down to the right leg to the lateral aspect of the right foot with numbness and tingling; however  current pain medication regimen (tramadol ) helped to manage pain level and functional ability.  The patient was advised to schedule a visit with Dr. Naveira for evaluation of potential interventional therapies in the future and will continue use tramadol  as prescribed.   Chronic pain syndrome: Patient's pain is controlled with tramadol , will continue on current medication regimen. Routine UDS ordered today to verify THC in system before continues with pain medication regimen.  Prescribing drug monitoring (PMP) reviewed; findings consistent with the use of prescribed medication and no evidence of  narcotic misuse or abuse.      Caleb Edwards has a current medication list which includes the following long-term medication(s): chlorthalidone , lisinopril , pantoprazole , rosuvastatin, trazodone, and tramadol .  Pharmacotherapy (Medications Ordered): Meds ordered this encounter  Medications   traMADol  (ULTRAM ) 50 MG tablet    Sig: Take 2 tablets (100 mg total) by mouth every 6 (six) hours as needed.    Dispense:  240 tablet    Refill:  0   Orders:  No orders of the defined types were placed in this encounter.       Return in about 1 month (around 08/31/2024) for (F2F), (MM), Emmy Blanch NP.    Recent Visits Date Type Provider Dept  07/02/24 Office Visit Khamron Gellert K, NP [facility]  06/06/24 Office Visit Tanya Glisson, MD [facility]  05/08/24 Office Visit Tanya Glisson, MD [facility]  Showing recent visits within past 90 days and meeting all other requirements Today's Visits Date Type Provider Dept  07/31/24 Office Visit Paola Aleshire K, NP [facility]  Showing today's visits and meeting all other requirements Future Appointments Date Type Provider Dept  08/14/24 Appointment Lucy Boardman K, NP [facility]  Showing future appointments within next 90 days and meeting all other requirements  I discussed the assessment  and treatment plan with the patient. The patient was provided an opportunity to ask questions and all were answered. The patient agreed with the plan and demonstrated an understanding of the instructions.  Patient advised to call back or seek an in-person evaluation if the symptoms or condition worsens.  I personally spent a total of 30 minutes in the care of the patient today including preparing to see the patient, getting/reviewing separately obtained history, performing a medically appropriate exam/evaluation, counseling and educating, placing orders, referring and communicating with other health care professionals, documenting clinical information in the EHR, independently interpreting results, communicating results, and coordinating care.   Note by: Nga Rabon K Tosh Glaze, NP (TTS and AI technology used. I apologize for any typographical errors that were not detected and corrected.) Date: 07/31/2024; Time: 11:45 AM

## 2024-07-31 NOTE — Progress Notes (Signed)
 Safety precautions to be maintained throughout the outpatient stay will include: orient to surroundings, keep bed in low position, maintain call bell within reach at all times, provide assistance with transfer out of bed and ambulation.   Nursing Pain Medication Assessment:  Safety precautions to be maintained throughout the outpatient stay will include: orient to surroundings, keep bed in low position, maintain call bell within reach at all times, provide assistance with transfer out of bed and ambulation.  Medication Inspection Compliance: Caleb Edwards did not comply with our request to bring his pills to be counted. He was reminded that bringing the medication bottles, even when empty, is a requirement.  Medication: Tramadol  (Ultram ) Pill/Patch Count: No pills/patches available to be counted. Pill/Patch Appearance: No markings Bottle Appearance: No container available. Did not bring bottle(s) to appointment. Filled Date: no bottle / no bottle / 2025 Last Medication intake:  Today  INstructed pt to always bring bottle to appt

## 2024-08-06 ENCOUNTER — Encounter: Payer: Self-pay | Admitting: Radiology

## 2024-08-14 ENCOUNTER — Ambulatory Visit: Payer: Medicare (Managed Care) | Attending: Nurse Practitioner | Admitting: Nurse Practitioner

## 2024-08-14 ENCOUNTER — Encounter: Payer: Self-pay | Admitting: Nurse Practitioner

## 2024-08-14 VITALS — BP 132/86 | HR 72 | Temp 97.2°F | Resp 18 | Ht 72.0 in | Wt 175.0 lb

## 2024-08-14 DIAGNOSIS — G894 Chronic pain syndrome: Secondary | ICD-10-CM | POA: Insufficient documentation

## 2024-08-14 DIAGNOSIS — Z79899 Other long term (current) drug therapy: Secondary | ICD-10-CM | POA: Insufficient documentation

## 2024-08-14 DIAGNOSIS — Z79891 Long term (current) use of opiate analgesic: Secondary | ICD-10-CM | POA: Insufficient documentation

## 2024-08-14 DIAGNOSIS — G8929 Other chronic pain: Secondary | ICD-10-CM | POA: Insufficient documentation

## 2024-08-14 DIAGNOSIS — M545 Low back pain, unspecified: Secondary | ICD-10-CM | POA: Insufficient documentation

## 2024-08-14 MED ORDER — TRAMADOL HCL 50 MG PO TABS: 100.0000 mg | ORAL_TABLET | Freq: Four times a day (QID) | ORAL | 2 refills | Status: AC | PRN

## 2024-08-14 NOTE — Progress Notes (Signed)
 PROVIDER NOTE: Interpretation of information contained herein should be left to medically-trained personnel. Specific patient instructions are provided elsewhere under Patient Instructions section of medical record. This document was created in part using AI and STT-dictation technology, any transcriptional errors that may result from this process are unintentional.  Patient: Caleb Edwards  Service: E/M   PCP: Nola Hong, MD  DOB: [birth year]  DOS: 08/14/2024  Provider: Emmy MARLA Blanch, NP  MRN: [id number]  Delivery: Face-to-face  Specialty: Interventional Pain Management  Type: Established Patient  Setting: Ambulatory outpatient facility  Specialty designation: 09  Referring Prov.: Nola Hong, MD  Location: Outpatient office facility       History of present illness (HPI) [name], a 73 y.o. year old male, is here today because of his back pain.  [name] primary complain today is Back Pain.  Pertinent problems: 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   Pain Assessment: Severity of Chronic pain is reported as a 8 /10. Location: Back Right/From lower right back down leg and to toes. Onset: More than a month ago. Quality: Tingling. Timing: Constant. Modifying factor(s): Denies. Vitals:  height is 6' (1.829 m) and weight is 175 lb (79.4 kg). His temporal temperature is 97.2 F (36.2 C) (abnormal). His blood pressure is 132/86 and his pulse is 72. His respiration is 18 and oxygen saturation is 100%.  BMI: Estimated body mass index is 23.73 kg/m as calculated from the following:   Height  as of this encounter: 6' (1.829 m).   Weight as of this encounter: 175 lb (79.4 kg).  Last encounter: 07/31/2024. Last procedure: Visit date not found.  Reason for encounter: medication management. The patient indicates doing well with current medication regimen. No side effects adverse reaction reported to medication.   Discussed the use of AI scribe software for clinical note transcription with the patient, who gave verbal consent to proceed.  History of Present Illness   Caleb Edwards is a 73 year old male who presents with persistent low back pain radiating to the right foot.  He experiences persistent low back pain radiating from the lumbar region to the top of his right foot. The pain is described as 'pins and needles' or a 'light electric shock' sensation and is constant. It worsens after prolonged periods of inactivity, such as after sleep.  He underwent a radiofrequency ablation procedure in June, which initially provided relief. The procedure involved burning the nerve on both sides, starting with the left and then the right. However, symptoms have returned on the right side.  He is currently on medication taken every six hours. He prefers receiving multiple prescriptions at once to reduce the need for frequent visits for refills.     Pharmacotherapy Assessment   Tramadol  (Ultram ) 50 mg tablet (100 mg total), 2 tablets by mouth every 6 hours as needed for pain. MME=80 Monitoring: [place] PMP: PDMP reviewed during this encounter.       Pharmacotherapy: No side-effects or adverse reactions reported. Compliance: No problems identified. Effectiveness: Clinically acceptable.  Caleb Edwards, [place]  08/14/2024  9:54 AM  Sign when Signing Visit Nursing Pain Medication Assessment:  Safety precautions  to be maintained throughout the outpatient stay will include: orient to surroundings, keep bed in low position, maintain call bell within reach at all times, provide assistance with transfer  out of bed and ambulation.  Medication Inspection Compliance: Pill count conducted under aseptic conditions, in front of the patient. Neither the pills nor the bottle was removed from the patient's sight at any time. Once count was completed pills were immediately returned to the patient in their original bottle.  Medication: Tramadol  (Ultram ) Pill/Patch Count: 155 of 240 pills/patches remain Pill/Patch Appearance: Markings consistent with prescribed medication Bottle Appearance: Standard pharmacy container. Clearly labeled. Filled Date: 55 / 31 / 2025 Last Medication intake:  Today    UDS:  Summary  Date Value Ref Range Status  07/02/2024 FINAL  Final    Comment:    ==================================================================== [employer] Select 13 (MW) ==================================================================== Test                             Result       Flag       Units  Drug Present and Declared for Prescription Verification   Tramadol                        >5952        EXPECTED   ng/mg creat   O-Desmethyltramadol            >5952        EXPECTED   ng/mg creat   N-Desmethyltramadol            1443         EXPECTED   ng/mg creat    Source of tramadol  is a prescription medication. O-desmethyltramadol    and N-desmethyltramadol are expected metabolites of tramadol .  ==================================================================== Test                      Result    Flag   Units      Ref Range   Creatinine              84               mg/dL      >=79 ==================================================================== Declared Medications:  The flagging and interpretation on this report are based on the  following declared medications.  Unexpected results may arise from  inaccuracies in the declared medications.   **Note: The testing scope of this panel includes these medications:   Tramadol    **Note: The testing scope of this panel does not include the   following reported medications:   Acetaminophen   Amlodipine  (Norvasc )  Chlorthalidone  (Hygroton )  Cyclobenzaprine  (Flexeril )  Ibuprofen  Iron  Lidocaine   Lisinopril  (Zestril )  Naloxone  (Narcan )  Pantoprazole  (Protonix )  Rosuvastatin (Crestor)  Sildenafil (Viagra)  Trazodone ==================================================================== For clinical consultation, please call [phone]. ====================================================================     CBD:THC Ratio  Date Value Ref Range Status  04/17/2024 Negative RATIO Final    Comment:    INTERPRETATION: Neither CBD nor marijuana or marijuana/THC products detected in this sample.  This test measures Cannabidiol  (CBD) and Tetrahydrocannabinol (THC) and metabolites in urine. The CBD:THC ratio is calculated using the sums of the respective metabolites and is intended to assist in differentiating the presence of Tetrahydrocannabinol [facility]) metabolites due to the use of marijuana or medicinal THC from the presence of THC metabolites due to use of Cannabidiol  (CBD) or hemp products that purportedly contain trace  amounts of THC.  CBD:THC Ratio          Interpretation ---------      --------------------------------------------- >=10.0         Consistent with the use of CBD products only 1.0 - 9.9      Indeterminate <1.0           Consistent with marijuana, medicinal THC or                mixed use  Interpretive ranges are provided as guidance and should not be considered definitive. Interpretation of results should include consideration of all relevant clinical and diagnostic information.  Analysis performed by chromatography with mass spectrometry. This test was developed and its performance characteristics determined by [facility].  It has not been cleared or approved by the Food and Drug Administration.    Carboxy-Delta-8-THC  Date Value Ref Range Status  04/17/2024 Not Detected ng/mL Final    Carboxy-Delta-9-THC  Date Value Ref Range Status  04/17/2024 Not Detected ng/mL Final    Comment:    Carboxy-Delta-9-THC is the primary metabolite of Delta-9- Tetrahydrocannabinol. Sources include the prescription medication Dronabinol as well as illicit, recreational, and medical marijuana and marijuana derived products of the same categories.  Carboxy-Delta-8-THC is the primary metabolite of Delta-8- Tetrahydrocannabinol. Sources are products containing Delta- 8-THC, which is primarily chemically manufactured from cannabidiol  (CBD).  Testing Threshold = 2.0 ng/mL  Analysis performed by Liquid Chromatography with Tandem Mass Spectrometry (LC/MS/MS).  This test was developed and its performance characteristics determined by [facility].  It has not been cleared or approved by the Food and Drug Administration.     ROS  Constitutional: Denies any fever or chills Gastrointestinal: No reported hemesis, hematochezia, vomiting, or acute GI distress Musculoskeletal: low back pain Neurological: No reported episodes of acute onset apraxia, aphasia, dysarthria, agnosia, amnesia, paralysis, loss of coordination, or loss of consciousness  Medication Review  acetaminophen , amLODipine , chlorthalidone , cyclobenzaprine , ferrous sulfate , ibuprofen, lidocaine , lisinopril , naloxone , pantoprazole , rosuvastatin, sildenafil, traMADol , and traZODone  History Review  Allergy: Mr. Mowatt has no known allergies. Drug: Mr. Lowdermilk  reports no history of drug use. Alcohol:  reports current alcohol use of about 1.0 standard drink of alcohol per week. Tobacco:  reports that he has quit smoking. He has been exposed to tobacco smoke. He has never used smokeless tobacco. Social: Mr. Lich  reports that he has quit smoking. He has been exposed to tobacco smoke. He has never used smokeless tobacco. He reports current alcohol use of about 1.0 standard drink of alcohol per week. He reports that he does not use  drugs. Medical:  has a past medical history of Back pain, Blood transfusion without reported diagnosis (12/2015), Diverticulitis, Hypertension, Kidney stone, and Ulcer, stomach peptic, chronic. Surgical: Mr. Crehan  has a past surgical history that includes Esophagogastroduodenoscopy (egd) with propofol  (N/A, 01/03/2016); Colonoscopy with propofol  (N/A, 08/02/2017); Esophagogastroduodenoscopy (egd) with propofol  (08/02/2017); Esophagogastroduodenoscopy (egd) with propofol  (N/A, 02/05/2018); Colonoscopy with propofol  (N/A, 02/05/2018); Esophagogastroduodenoscopy (egd) with propofol  (N/A, 03/30/2018); Esophagogastroduodenoscopy (egd) with propofol  (N/A, 08/09/2019); Colonoscopy with propofol  (N/A, 08/09/2019); Givens capsule study (N/A, 04/24/2020); Esophagogastroduodenoscopy (egd) with propofol  (N/A, 04/24/2020); and Maxillary antrostomy (Left, 12/10/2022). Family: family history includes Diabetes in his mother; Heart disease in his father; Prostate cancer in his brother.  Laboratory Chemistry Profile   Renal Lab Results  Component Value Date   BUN 14 01/19/2024   CREATININE 1.25 (H) 01/19/2024   GFRAA >60 02/21/2020   GFRNONAA >60 01/19/2024    Hepatic Lab Results  Component Value Date   AST 23 08/09/2019   ALT 15 08/09/2019   ALBUMIN 3.8 08/09/2019   ALKPHOS 53 08/09/2019   LIPASE 17 08/08/2019    Electrolytes Lab Results  Component Value Date   NA 137 01/19/2024   K 3.9 01/19/2024   CL 103 01/19/2024   CALCIUM  9.5 01/19/2024   MG 1.9 01/04/2016    Bone Lab Results  Component Value Date   VD25OH 24.0 (L) 12/26/2015    Inflammation (CRP: Acute Phase) (ESR: Chronic Phase) Lab Results  Component Value Date   CRP 0.6 12/26/2015   ESRSEDRATE 3 12/26/2015         Note: Above Lab results reviewed.  Recent Imaging Review  DG PAIN CLINIC C-ARM 1-60 MIN NO REPORT Fluoro was used, but no Radiologist interpretation will be provided.  Please refer to NOTES tab for provider progress  note. Note: Reviewed        Physical Exam  Vitals: BP 132/86 (BP Location: Right Arm, Patient Position: Sitting, Cuff Size: Normal)   Pulse 72   Temp (!) 97.2 F (36.2 C) (Temporal)   Resp 18   Ht 6' (1.829 m)   Wt 175 lb (79.4 kg)   SpO2 100%   BMI 23.73 kg/m  BMI: Estimated body mass index is 23.73 kg/m as calculated from the following:   Height as of this encounter: 6' (1.829 m).   Weight as of this encounter: 175 lb (79.4 kg). Ideal: Ideal body weight: 77.6 kg (171 lb 1.2 oz) Adjusted ideal body weight: 78.3 kg (172 lb 10.3 oz) General appearance: Well nourished, well developed, and well hydrated. In no apparent acute distress Mental status: Alert, oriented x 3 (person, place, & time)       Respiratory: No evidence of acute respiratory distress Eyes: PERLA  Musculoskeletal: +LBP Assessment   Diagnosis Status  1. Multifactorial low back pain   2. Chronic pain syndrome   3. Chronic use of opiate for therapeutic purpose   4. Medication management   5. Chronic low back pain (Bilateral) w/o sciatica    Controlled Controlled Controlled   Updated Problems: No problems updated.  Plan of Care  Problem-specific:  Assessment and Plan    Low back pain with right-sided radicular symptoms Chronic low back pain with radicular symptoms. Previous radiofrequency ablation provided temporary relief. Considering repeat procedure or alternative treatments. The patient previously underwent lumbar radiofrequency ablation with excellent result. However, he is currently experiencing a intermittent flare up of pain, more pronounced on the right side, accompanied by numbness along the lateral aspect of the right foot.  - Scheduled appointment with Dr. Naveira for consultation on radiofrequency ablation or other treatments.  Multifactorial low back pain (Controlled): The patient continues to experience low back pain (R>L) radiate down to the right leg to the lateral aspect of the right foot  with numbness and tingling; however current pain medication regimen (tramadol ) helped to manage pain level and functional ability.  The patient was advised to schedule a visit with Dr. Naveira for evaluation of potential interventional therapies in the future and will continue use tramadol  as prescribed.   Chronic pain syndrome: Managed with medication. Concern about frequent pharmacy visits. Urine drug screening required due to past cannabis use. - Provided three refill of the tramadol  prescription for pain management. Patient's pain is controlled with tramadol , will continue on current medication regimen. Routine UDS ordered today to verify THC in system before continues with pain medication regimen. Prescribing drug monitoring (PMP)  reviewed; findings consistent with the use of prescribed medication and no evidence of narcotic misuse or abuse.         Mr. EDILBERTO ROOSEVELT has a current medication list which includes the following long-term medication(s): chlorthalidone , lisinopril , pantoprazole , rosuvastatin, trazodone, and [START ON 09/02/2024] tramadol .  Pharmacotherapy (Medications Ordered): Meds ordered this encounter  Medications   traMADol  (ULTRAM ) 50 MG tablet    Sig: Take 2 tablets (100 mg total) by mouth every 6 (six) hours as needed.    Dispense:  240 tablet    Refill:  2   Orders:  No orders of the defined types were placed in this encounter.       Return in about 3 months (around 11/14/2024) for (F2F), (MM), Emmy Blanch NP.    Recent Visits Date Type Provider Dept  07/31/24 Office Visit Kayde Warehime K, NP [facility]  07/02/24 Office Visit Cordera Stineman K, NP [facility]  06/06/24 Office Visit Tanya Glisson, MD [facility]  Showing recent visits within past 90 days and meeting all other requirements Today's Visits Date Type Provider Dept  08/14/24 Office Visit Eros Montour K, NP [facility]  Showing today's visits and  meeting all other requirements Future Appointments No visits were found meeting these conditions. Showing future appointments within next 90 days and meeting all other requirements  I discussed the assessment and treatment plan with the patient. The patient was provided an opportunity to ask questions and all were answered. The patient agreed with the plan and demonstrated an understanding of the instructions.  Patient advised to call back or seek an in-person evaluation if the symptoms or condition worsens.  I personally spent a total of 30 minutes in the care of the patient today including preparing to see the patient, getting/reviewing separately obtained history, performing a medically appropriate exam/evaluation, counseling and educating, placing orders, referring and communicating with other health care professionals, documenting clinical information in the EHR, independently interpreting results, communicating results, and coordinating care.   Note by: Totiana Everson K Ethaniel Garfield, NP (TTS and AI technology used. I apologize for any typographical errors that were not detected and corrected.) Date: 08/14/2024; Time: 12:25 PM

## 2024-08-14 NOTE — Progress Notes (Signed)
 Nursing Pain Medication Assessment:  Safety precautions to be maintained throughout the outpatient stay will include: orient to surroundings, keep bed in low position, maintain call bell within reach at all times, provide assistance with transfer out of bed and ambulation.  Medication Inspection Compliance: Pill count conducted under aseptic conditions, in front of the patient. Neither the pills nor the bottle was removed from the patient's sight at any time. Once count was completed pills were immediately returned to the patient in their original bottle.  Medication: Tramadol  (Ultram ) Pill/Patch Count: 155 of 240 pills/patches remain Pill/Patch Appearance: Markings consistent with prescribed medication Bottle Appearance: Standard pharmacy container. Clearly labeled. Filled Date: 50 / 31 / 2025 Last Medication intake:  Today

## 2024-11-13 ENCOUNTER — Encounter: Payer: Medicare (Managed Care) | Admitting: Nurse Practitioner
